# Patient Record
Sex: Male | Born: 1960 | Race: White | Hispanic: No | Smoking: Former smoker
Health system: Southern US, Community
[De-identification: ages and names within clinical notes are randomized; demographics above are authoritative.]

## PROBLEM LIST (undated history)

## (undated) DIAGNOSIS — M778 Other enthesopathies, not elsewhere classified: Secondary | ICD-10-CM

## (undated) DIAGNOSIS — R1310 Dysphagia, unspecified: Secondary | ICD-10-CM

## (undated) DIAGNOSIS — E669 Obesity, unspecified: Secondary | ICD-10-CM

## (undated) DIAGNOSIS — Z8619 Personal history of other infectious and parasitic diseases: Secondary | ICD-10-CM

## (undated) DIAGNOSIS — T7840XA Allergy, unspecified, initial encounter: Secondary | ICD-10-CM

## (undated) DIAGNOSIS — J80 Acute respiratory distress syndrome: Secondary | ICD-10-CM

## (undated) DIAGNOSIS — M25561 Pain in right knee: Secondary | ICD-10-CM

## (undated) DIAGNOSIS — B958 Unspecified staphylococcus as the cause of diseases classified elsewhere: Secondary | ICD-10-CM

## (undated) DIAGNOSIS — R202 Paresthesia of skin: Secondary | ICD-10-CM

## (undated) DIAGNOSIS — R200 Anesthesia of skin: Secondary | ICD-10-CM

## (undated) DIAGNOSIS — M25562 Pain in left knee: Secondary | ICD-10-CM

## (undated) DIAGNOSIS — U071 COVID-19: Secondary | ICD-10-CM

## (undated) DIAGNOSIS — K219 Gastro-esophageal reflux disease without esophagitis: Secondary | ICD-10-CM

## (undated) HISTORY — DX: Dysphagia, unspecified: R13.10

## (undated) HISTORY — DX: Acute respiratory distress syndrome: J80

## (undated) HISTORY — DX: Gastro-esophageal reflux disease without esophagitis: K21.9

## (undated) HISTORY — PX: KNEE ARTHROSCOPY: SUR90

## (undated) HISTORY — PX: HERNIA REPAIR: SHX51

## (undated) HISTORY — PX: TONSILLECTOMY AND ADENOIDECTOMY: SUR1326

## (undated) HISTORY — DX: Personal history of other infectious and parasitic diseases: Z86.19

## (undated) HISTORY — DX: COVID-19: U07.1

---

## 2020-10-20 ENCOUNTER — Other Ambulatory Visit: Payer: Self-pay

## 2020-10-20 ENCOUNTER — Emergency Department (HOSPITAL_COMMUNITY): Payer: BC Managed Care – PPO

## 2020-10-20 ENCOUNTER — Inpatient Hospital Stay (HOSPITAL_COMMUNITY)
Admission: EM | Admit: 2020-10-20 | Discharge: 2020-12-18 | DRG: 004 | Disposition: A | Discharge status: Rehabilitation Facility | Payer: BC Managed Care – PPO | Attending: Internal Medicine | Admitting: Internal Medicine

## 2020-10-20 ENCOUNTER — Encounter (HOSPITAL_COMMUNITY): Payer: Self-pay | Admitting: Emergency Medicine

## 2020-10-20 DIAGNOSIS — Z9911 Dependence on respirator [ventilator] status: Secondary | ICD-10-CM | POA: Diagnosis not present

## 2020-10-20 DIAGNOSIS — Z931 Gastrostomy status: Secondary | ICD-10-CM | POA: Diagnosis not present

## 2020-10-20 DIAGNOSIS — R739 Hyperglycemia, unspecified: Secondary | ICD-10-CM | POA: Diagnosis not present

## 2020-10-20 DIAGNOSIS — G9341 Metabolic encephalopathy: Secondary | ICD-10-CM | POA: Diagnosis not present

## 2020-10-20 DIAGNOSIS — E870 Hyperosmolality and hypernatremia: Secondary | ICD-10-CM | POA: Diagnosis not present

## 2020-10-20 DIAGNOSIS — J9601 Acute respiratory failure with hypoxia: Secondary | ICD-10-CM | POA: Diagnosis not present

## 2020-10-20 DIAGNOSIS — Z1624 Resistance to multiple antibiotics: Secondary | ICD-10-CM | POA: Diagnosis not present

## 2020-10-20 DIAGNOSIS — U071 COVID-19: Secondary | ICD-10-CM | POA: Diagnosis not present

## 2020-10-20 DIAGNOSIS — D6489 Other specified anemias: Secondary | ICD-10-CM | POA: Diagnosis not present

## 2020-10-20 DIAGNOSIS — E669 Obesity, unspecified: Secondary | ICD-10-CM | POA: Diagnosis present

## 2020-10-20 DIAGNOSIS — Z6835 Body mass index (BMI) 35.0-35.9, adult: Secondary | ICD-10-CM | POA: Diagnosis not present

## 2020-10-20 DIAGNOSIS — J9600 Acute respiratory failure, unspecified whether with hypoxia or hypercapnia: Secondary | ICD-10-CM

## 2020-10-20 DIAGNOSIS — D638 Anemia in other chronic diseases classified elsewhere: Secondary | ICD-10-CM | POA: Diagnosis present

## 2020-10-20 DIAGNOSIS — R6521 Severe sepsis with septic shock: Secondary | ICD-10-CM | POA: Diagnosis present

## 2020-10-20 DIAGNOSIS — U099 Post covid-19 condition, unspecified: Secondary | ICD-10-CM | POA: Diagnosis not present

## 2020-10-20 DIAGNOSIS — D62 Acute posthemorrhagic anemia: Secondary | ICD-10-CM

## 2020-10-20 DIAGNOSIS — R0902 Hypoxemia: Secondary | ICD-10-CM

## 2020-10-20 DIAGNOSIS — J158 Pneumonia due to other specified bacteria: Secondary | ICD-10-CM | POA: Diagnosis not present

## 2020-10-20 DIAGNOSIS — J181 Lobar pneumonia, unspecified organism: Secondary | ICD-10-CM | POA: Diagnosis not present

## 2020-10-20 DIAGNOSIS — Z23 Encounter for immunization: Secondary | ICD-10-CM

## 2020-10-20 DIAGNOSIS — R7989 Other specified abnormal findings of blood chemistry: Secondary | ICD-10-CM | POA: Diagnosis not present

## 2020-10-20 DIAGNOSIS — K567 Ileus, unspecified: Secondary | ICD-10-CM | POA: Diagnosis not present

## 2020-10-20 DIAGNOSIS — J80 Acute respiratory distress syndrome: Secondary | ICD-10-CM

## 2020-10-20 DIAGNOSIS — J1282 Pneumonia due to coronavirus disease 2019: Secondary | ICD-10-CM | POA: Diagnosis not present

## 2020-10-20 DIAGNOSIS — Z452 Encounter for adjustment and management of vascular access device: Secondary | ICD-10-CM

## 2020-10-20 DIAGNOSIS — A419 Sepsis, unspecified organism: Secondary | ICD-10-CM | POA: Diagnosis present

## 2020-10-20 DIAGNOSIS — R1310 Dysphagia, unspecified: Secondary | ICD-10-CM | POA: Diagnosis not present

## 2020-10-20 DIAGNOSIS — E878 Other disorders of electrolyte and fluid balance, not elsewhere classified: Secondary | ICD-10-CM | POA: Diagnosis not present

## 2020-10-20 DIAGNOSIS — Z79899 Other long term (current) drug therapy: Secondary | ICD-10-CM | POA: Diagnosis not present

## 2020-10-20 DIAGNOSIS — S90411A Abrasion, right great toe, initial encounter: Secondary | ICD-10-CM | POA: Diagnosis not present

## 2020-10-20 DIAGNOSIS — J9602 Acute respiratory failure with hypercapnia: Secondary | ICD-10-CM

## 2020-10-20 DIAGNOSIS — L89312 Pressure ulcer of right buttock, stage 2: Secondary | ICD-10-CM | POA: Diagnosis not present

## 2020-10-20 DIAGNOSIS — E440 Moderate protein-calorie malnutrition: Secondary | ICD-10-CM | POA: Diagnosis not present

## 2020-10-20 DIAGNOSIS — N170 Acute kidney failure with tubular necrosis: Secondary | ICD-10-CM | POA: Diagnosis not present

## 2020-10-20 DIAGNOSIS — E871 Hypo-osmolality and hyponatremia: Secondary | ICD-10-CM | POA: Diagnosis not present

## 2020-10-20 DIAGNOSIS — N179 Acute kidney failure, unspecified: Secondary | ICD-10-CM

## 2020-10-20 DIAGNOSIS — L89150 Pressure ulcer of sacral region, unstageable: Secondary | ICD-10-CM | POA: Diagnosis not present

## 2020-10-20 DIAGNOSIS — Z430 Encounter for attention to tracheostomy: Secondary | ICD-10-CM | POA: Diagnosis not present

## 2020-10-20 DIAGNOSIS — D649 Anemia, unspecified: Secondary | ICD-10-CM | POA: Diagnosis not present

## 2020-10-20 DIAGNOSIS — J156 Pneumonia due to other aerobic Gram-negative bacteria: Secondary | ICD-10-CM | POA: Diagnosis not present

## 2020-10-20 DIAGNOSIS — A498 Other bacterial infections of unspecified site: Secondary | ICD-10-CM | POA: Diagnosis not present

## 2020-10-20 DIAGNOSIS — R5381 Other malaise: Secondary | ICD-10-CM | POA: Diagnosis not present

## 2020-10-20 DIAGNOSIS — E781 Pure hyperglyceridemia: Secondary | ICD-10-CM | POA: Diagnosis present

## 2020-10-20 DIAGNOSIS — T17918A Gastric contents in respiratory tract, part unspecified causing other injury, initial encounter: Secondary | ICD-10-CM | POA: Diagnosis not present

## 2020-10-20 DIAGNOSIS — E46 Unspecified protein-calorie malnutrition: Secondary | ICD-10-CM | POA: Diagnosis present

## 2020-10-20 DIAGNOSIS — G7281 Critical illness myopathy: Secondary | ICD-10-CM

## 2020-10-20 DIAGNOSIS — M62838 Other muscle spasm: Secondary | ICD-10-CM | POA: Diagnosis not present

## 2020-10-20 DIAGNOSIS — Z515 Encounter for palliative care: Secondary | ICD-10-CM | POA: Diagnosis not present

## 2020-10-20 DIAGNOSIS — R579 Shock, unspecified: Secondary | ICD-10-CM | POA: Diagnosis not present

## 2020-10-20 DIAGNOSIS — G6281 Critical illness polyneuropathy: Secondary | ICD-10-CM | POA: Diagnosis not present

## 2020-10-20 DIAGNOSIS — Z7189 Other specified counseling: Secondary | ICD-10-CM | POA: Diagnosis not present

## 2020-10-20 DIAGNOSIS — Z4659 Encounter for fitting and adjustment of other gastrointestinal appliance and device: Secondary | ICD-10-CM | POA: Diagnosis not present

## 2020-10-20 DIAGNOSIS — J690 Pneumonitis due to inhalation of food and vomit: Secondary | ICD-10-CM | POA: Diagnosis not present

## 2020-10-20 DIAGNOSIS — Z87891 Personal history of nicotine dependence: Secondary | ICD-10-CM | POA: Diagnosis not present

## 2020-10-20 DIAGNOSIS — G4700 Insomnia, unspecified: Secondary | ICD-10-CM | POA: Diagnosis not present

## 2020-10-20 DIAGNOSIS — Z8619 Personal history of other infectious and parasitic diseases: Secondary | ICD-10-CM | POA: Diagnosis not present

## 2020-10-20 DIAGNOSIS — J9690 Respiratory failure, unspecified, unspecified whether with hypoxia or hypercapnia: Secondary | ICD-10-CM

## 2020-10-20 DIAGNOSIS — T380X5A Adverse effect of glucocorticoids and synthetic analogues, initial encounter: Secondary | ICD-10-CM | POA: Diagnosis not present

## 2020-10-20 DIAGNOSIS — M792 Neuralgia and neuritis, unspecified: Secondary | ICD-10-CM | POA: Diagnosis not present

## 2020-10-20 DIAGNOSIS — G5621 Lesion of ulnar nerve, right upper limb: Secondary | ICD-10-CM | POA: Diagnosis not present

## 2020-10-20 DIAGNOSIS — R0689 Other abnormalities of breathing: Secondary | ICD-10-CM | POA: Diagnosis not present

## 2020-10-20 DIAGNOSIS — B9729 Other coronavirus as the cause of diseases classified elsewhere: Secondary | ICD-10-CM | POA: Diagnosis not present

## 2020-10-20 DIAGNOSIS — E875 Hyperkalemia: Secondary | ICD-10-CM | POA: Diagnosis not present

## 2020-10-20 DIAGNOSIS — A0839 Other viral enteritis: Secondary | ICD-10-CM | POA: Diagnosis not present

## 2020-10-20 DIAGNOSIS — E639 Nutritional deficiency, unspecified: Secondary | ICD-10-CM | POA: Diagnosis not present

## 2020-10-20 DIAGNOSIS — T17908D Unspecified foreign body in respiratory tract, part unspecified causing other injury, subsequent encounter: Secondary | ICD-10-CM | POA: Diagnosis not present

## 2020-10-20 DIAGNOSIS — A4189 Other specified sepsis: Principal | ICD-10-CM | POA: Diagnosis present

## 2020-10-20 DIAGNOSIS — Z930 Tracheostomy status: Secondary | ICD-10-CM | POA: Diagnosis not present

## 2020-10-20 DIAGNOSIS — N200 Calculus of kidney: Secondary | ICD-10-CM | POA: Diagnosis not present

## 2020-10-20 DIAGNOSIS — N3941 Urge incontinence: Secondary | ICD-10-CM | POA: Diagnosis not present

## 2020-10-20 DIAGNOSIS — E441 Mild protein-calorie malnutrition: Secondary | ICD-10-CM | POA: Diagnosis not present

## 2020-10-20 DIAGNOSIS — Z6832 Body mass index (BMI) 32.0-32.9, adult: Secondary | ICD-10-CM

## 2020-10-20 DIAGNOSIS — L89322 Pressure ulcer of left buttock, stage 2: Secondary | ICD-10-CM | POA: Diagnosis not present

## 2020-10-20 DIAGNOSIS — Z4682 Encounter for fitting and adjustment of non-vascular catheter: Secondary | ICD-10-CM | POA: Diagnosis not present

## 2020-10-20 DIAGNOSIS — E876 Hypokalemia: Secondary | ICD-10-CM | POA: Diagnosis not present

## 2020-10-20 DIAGNOSIS — D696 Thrombocytopenia, unspecified: Secondary | ICD-10-CM | POA: Diagnosis not present

## 2020-10-20 DIAGNOSIS — Z0189 Encounter for other specified special examinations: Secondary | ICD-10-CM

## 2020-10-20 DIAGNOSIS — T17908A Unspecified foreign body in respiratory tract, part unspecified causing other injury, initial encounter: Secondary | ICD-10-CM

## 2020-10-20 DIAGNOSIS — R609 Edema, unspecified: Secondary | ICD-10-CM | POA: Diagnosis not present

## 2020-10-20 DIAGNOSIS — Z8616 Personal history of COVID-19: Secondary | ICD-10-CM | POA: Diagnosis not present

## 2020-10-20 HISTORY — DX: Other enthesopathies, not elsewhere classified: M77.8

## 2020-10-20 HISTORY — DX: Pain in right knee: M25.562

## 2020-10-20 HISTORY — DX: Anesthesia of skin: R20.0

## 2020-10-20 HISTORY — DX: Pain in right knee: M25.561

## 2020-10-20 HISTORY — DX: Allergy, unspecified, initial encounter: T78.40XA

## 2020-10-20 HISTORY — DX: Paresthesia of skin: R20.2

## 2020-10-20 HISTORY — DX: Obesity, unspecified: E66.9

## 2020-10-20 HISTORY — DX: Unspecified staphylococcus as the cause of diseases classified elsewhere: B95.8

## 2020-10-20 LAB — URINALYSIS, ROUTINE W REFLEX MICROSCOPIC
Bilirubin Urine: NEGATIVE
Glucose, UA: NEGATIVE mg/dL
Hgb urine dipstick: NEGATIVE
Ketones, ur: 5 mg/dL — AB
Leukocytes,Ua: NEGATIVE
Nitrite: NEGATIVE
Protein, ur: 30 mg/dL — AB
Specific Gravity, Urine: 1.016 (ref 1.005–1.030)
pH: 5 (ref 5.0–8.0)

## 2020-10-20 LAB — COMPREHENSIVE METABOLIC PANEL
ALT: 44 U/L (ref 0–44)
AST: 114 U/L — ABNORMAL HIGH (ref 15–41)
Albumin: 3.3 g/dL — ABNORMAL LOW (ref 3.5–5.0)
Alkaline Phosphatase: 57 U/L (ref 38–126)
Anion gap: 12 (ref 5–15)
BUN: 18 mg/dL (ref 6–20)
CO2: 23 mmol/L (ref 22–32)
Calcium: 8.5 mg/dL — ABNORMAL LOW (ref 8.9–10.3)
Chloride: 97 mmol/L — ABNORMAL LOW (ref 98–111)
Creatinine, Ser: 1.18 mg/dL (ref 0.61–1.24)
GFR, Estimated: >60 mL/min (ref >60)
Glucose, Bld: 104 mg/dL — ABNORMAL HIGH (ref 70–99)
Potassium: 4.2 mmol/L (ref 3.5–5.1)
Sodium: 132 mmol/L — ABNORMAL LOW (ref 135–145)
Total Bilirubin: 0.8 mg/dL (ref 0.3–1.2)
Total Protein: 6.9 g/dL (ref 6.5–8.1)

## 2020-10-20 LAB — RESP PANEL BY RT-PCR (FLU A&B, COVID) ARPGX2
Influenza A by PCR: NEGATIVE
Influenza B by PCR: NEGATIVE
SARS Coronavirus 2 by RT PCR: POSITIVE — AB

## 2020-10-20 LAB — FIBRINOGEN: Fibrinogen: 763 mg/dL — ABNORMAL HIGH (ref 210–475)

## 2020-10-20 LAB — FERRITIN: Ferritin: 1526 ng/mL — ABNORMAL HIGH (ref 24–336)

## 2020-10-20 LAB — BLOOD GAS, ARTERIAL
Acid-base deficit: 1.3 mmol/L (ref 0.0–2.0)
Bicarbonate: 21.7 mmol/L (ref 20.0–28.0)
Drawn by: 38235
FIO2: 100
MECHVT: 600 mL
O2 Saturation: 72.1 %
PEEP: 10 cmH2O
Patient temperature: 39
RATE: 24 resp/min
pCO2 arterial: 60.5 mmHg — ABNORMAL HIGH (ref 32.0–48.0)
pH, Arterial: 7.247 — ABNORMAL LOW (ref 7.350–7.450)
pO2, Arterial: 55.6 mmHg — ABNORMAL LOW (ref 83.0–108.0)

## 2020-10-20 LAB — TRIGLYCERIDES: Triglycerides: 65 mg/dL (ref <150)

## 2020-10-20 LAB — CBC WITH DIFFERENTIAL/PLATELET
Abs Immature Granulocytes: 0.22 10*3/uL — ABNORMAL HIGH (ref 0.00–0.07)
Basophils Absolute: 0.1 10*3/uL (ref 0.0–0.1)
Basophils Relative: 1 %
Eosinophils Absolute: 0.1 10*3/uL (ref 0.0–0.5)
Eosinophils Relative: 1 %
HCT: 41.7 % (ref 39.0–52.0)
Hemoglobin: 14.4 g/dL (ref 13.0–17.0)
Immature Granulocytes: 4 %
Lymphocytes Relative: 15 %
Lymphs Abs: 0.9 10*3/uL (ref 0.7–4.0)
MCH: 33.2 pg (ref 26.0–34.0)
MCHC: 34.5 g/dL (ref 30.0–36.0)
MCV: 96.1 fL (ref 80.0–100.0)
Monocytes Absolute: 0.4 10*3/uL (ref 0.1–1.0)
Monocytes Relative: 6 %
Neutro Abs: 4.6 10*3/uL (ref 1.7–7.7)
Neutrophils Relative %: 73 %
Platelets: 230 10*3/uL (ref 150–400)
RBC: 4.34 MIL/uL (ref 4.22–5.81)
RDW: 13.2 % (ref 11.5–15.5)
WBC: 6.5 10*3/uL (ref 4.0–10.5)
nRBC: 0 % (ref 0.0–0.2)

## 2020-10-20 LAB — D-DIMER, QUANTITATIVE: D-Dimer, Quant: 2.53 ug/mL-FEU — ABNORMAL HIGH (ref 0.00–0.50)

## 2020-10-20 LAB — PROCALCITONIN: Procalcitonin: 0.31 ng/mL

## 2020-10-20 LAB — APTT: aPTT: 34 seconds (ref 24–36)

## 2020-10-20 LAB — TROPONIN I (HIGH SENSITIVITY): Troponin I (High Sensitivity): 118 ng/L (ref <18)

## 2020-10-20 LAB — PROTIME-INR
INR: 1 (ref 0.8–1.2)
Prothrombin Time: 13.2 seconds (ref 11.4–15.2)

## 2020-10-20 LAB — C-REACTIVE PROTEIN: CRP: 24.9 mg/dL — ABNORMAL HIGH (ref <1.0)

## 2020-10-20 LAB — LACTIC ACID, PLASMA: Lactic Acid, Venous: 1.8 mmol/L (ref 0.5–1.9)

## 2020-10-20 LAB — LACTATE DEHYDROGENASE: LDH: 933 U/L — ABNORMAL HIGH (ref 98–192)

## 2020-10-20 LAB — BRAIN NATRIURETIC PEPTIDE: B Natriuretic Peptide: 47 pg/mL (ref 0.0–100.0)

## 2020-10-20 MED ORDER — ALBUTEROL (5 MG/ML) CONTINUOUS INHALATION SOLN
10.0000 mg | INHALATION_SOLUTION | RESPIRATORY_TRACT | Status: AC
  Administered 2020-10-20: 10 mg via RESPIRATORY_TRACT
  Filled 2020-10-20: qty 20

## 2020-10-20 MED ORDER — SODIUM CHLORIDE 0.9 % IV SOLN
100.0000 mg | Freq: Every day | INTRAVENOUS | Status: AC
  Administered 2020-10-21 – 2020-10-24 (×4): 100 mg via INTRAVENOUS
  Filled 2020-10-20 (×4): qty 20

## 2020-10-20 MED ORDER — SODIUM CHLORIDE 0.9 % IV SOLN
2.0000 g | INTRAVENOUS | Status: DC
  Administered 2020-10-20: 2 g via INTRAVENOUS
  Filled 2020-10-20: qty 20

## 2020-10-20 MED ORDER — PIPERACILLIN-TAZOBACTAM 3.375 G IVPB 30 MIN
3.3750 g | Freq: Once | INTRAVENOUS | Status: AC
  Administered 2020-10-21: 3.375 g via INTRAVENOUS
  Filled 2020-10-20: qty 50

## 2020-10-20 MED ORDER — SODIUM CHLORIDE 0.9 % IV BOLUS
1000.0000 mL | Freq: Once | INTRAVENOUS | Status: AC
  Administered 2020-10-21: 1000 mL via INTRAVENOUS

## 2020-10-20 MED ORDER — DEXAMETHASONE SODIUM PHOSPHATE 10 MG/ML IJ SOLN
10.0000 mg | Freq: Once | INTRAMUSCULAR | Status: AC
  Administered 2020-10-20: 10 mg via INTRAVENOUS
  Filled 2020-10-20: qty 1

## 2020-10-20 MED ORDER — VANCOMYCIN HCL IN DEXTROSE 1-5 GM/200ML-% IV SOLN
1000.0000 mg | Freq: Once | INTRAVENOUS | Status: AC
  Administered 2020-10-21: 1000 mg via INTRAVENOUS
  Filled 2020-10-20: qty 200

## 2020-10-20 MED ORDER — ACETAMINOPHEN 650 MG RE SUPP
650.0000 mg | Freq: Once | RECTAL | Status: AC
  Administered 2020-10-20: 650 mg via RECTAL

## 2020-10-20 MED ORDER — SODIUM CHLORIDE 0.9 % IV SOLN
500.0000 mg | INTRAVENOUS | Status: DC
  Administered 2020-10-20: 500 mg via INTRAVENOUS
  Filled 2020-10-20: qty 500

## 2020-10-20 MED ORDER — NOREPINEPHRINE 4 MG/250ML-% IV SOLN
0.0000 ug/min | INTRAVENOUS | Status: DC
  Administered 2020-10-20: 2 ug/min via INTRAVENOUS
  Administered 2020-10-21 – 2020-10-22 (×3): 7 ug/min via INTRAVENOUS
  Filled 2020-10-20 (×4): qty 250

## 2020-10-20 MED ORDER — SODIUM CHLORIDE 0.9 % IV SOLN
100.0000 mg | INTRAVENOUS | Status: AC
  Administered 2020-10-20 – 2020-10-21 (×2): 100 mg via INTRAVENOUS
  Filled 2020-10-20 (×2): qty 20

## 2020-10-20 MED ORDER — FENTANYL 2500MCG IN NS 250ML (10MCG/ML) PREMIX INFUSION
0.0000 ug/h | INTRAVENOUS | Status: DC
  Administered 2020-10-20: 25 ug/h via INTRAVENOUS
  Administered 2020-10-21: 75 ug/h via INTRAVENOUS
  Filled 2020-10-20: qty 250

## 2020-10-20 MED ORDER — LACTATED RINGERS IV SOLN: INTRAVENOUS | Status: DC

## 2020-10-20 MED ORDER — NICARDIPINE HCL IN NACL 20-0.86 MG/200ML-% IV SOLN
3.0000 mg/h | INTRAVENOUS | Status: DC
  Administered 2020-10-20: 5 mg/h via INTRAVENOUS
  Filled 2020-10-20: qty 200

## 2020-10-20 MED ORDER — PROPOFOL 1000 MG/100ML IV EMUL
5.0000 ug/kg/min | INTRAVENOUS | Status: DC
  Administered 2020-10-21 (×2): 50 ug/kg/min via INTRAVENOUS
  Administered 2020-10-21: 40 ug/kg/min via INTRAVENOUS
  Filled 2020-10-20: qty 100
  Filled 2020-10-20: qty 200
  Filled 2020-10-20: qty 100
  Filled 2020-10-20: qty 200

## 2020-10-20 NOTE — Progress Notes (Signed)
Following for Code Sepsis  

## 2020-10-20 NOTE — ED Triage Notes (Signed)
Pt arrives in severe resp distress from home. Pt 19% on RA. Arrives on NRB at 51%. EDP Miller to bedside. Pt dx with COVID 2 days ago.

## 2020-10-20 NOTE — ED Provider Notes (Signed)
[facility] EMERGENCY DEPARTMENT Provider Note   CSN: [id number] Arrival date & time: 10/20/20  2021     History Chief Complaint  Patient presents with  . Respiratory Distress    Jackson Figueroa is a 59 y.o. male.  HPI   This patient is a 59 year old male, he presents to the hospital today with severe respiratory distress in fact it is to the point where he cannot give any information.  The patient is reportedly positive for Covid, tested a few days ago but became acutely worse and unresponsive at home tonight prompting the call to EMS who found the patient have an oxygen level of 19% with good waveform.  They immediately placed him on a nonrebreather, stimulated him and put him on a stretcher to bring to the hospital.  He was confused and altered the entire way not able to answer questions, level 5 caveat applies.  It is unclear whether this patient has any other significant history  Additional hx from wife - laid on bed int he aftenroon - and then couldn't wake him up = they both tested positive for covid 19 this last week.    PMH: None Meds: None other than vitamins. SH:  No tob, occasional wine, no drugs. Surgery:  Abd hernia surgery.  They live in [place] (moved from [place]),  Past Medical History:  Diagnosis Date  . Obesity     Patient Active Problem List   Diagnosis Date Noted  . Acute respiratory failure due to COVID-19 [facility]) 10/20/2020       No family history on file.  Social History   Tobacco Use  . Smoking status: Not on file  Substance Use Topics  . Alcohol use: Not on file  . Drug use: Not on file    Home Medications Prior to Admission medications   Not on File    Allergies    Patient has no known allergies.  Review of Systems   Review of Systems  Unable to perform ROS: Acuity of condition    Physical Exam Updated Vital Signs BP 136/88 (BP Location: Left Arm)   Pulse (!) 118   Temp (!) 102.6 F (39.2 C) (Bladder)   Resp (!) 22   Ht  1.829 m (6')   Wt 120 kg   SpO2 (!) 81%   BMI 35.88 kg/m   Physical Exam Constitutional:      General: He is in acute distress.     Appearance: He is obese. He is ill-appearing and toxic-appearing.  HENT:     Head: Normocephalic and atraumatic.     Nose: Nose normal.     Mouth/Throat:     Mouth: Mucous membranes are moist.     Pharynx: No oropharyngeal exudate.  Eyes:     General: No scleral icterus.       Right eye: No discharge.        Left eye: No discharge.     Conjunctiva/sclera: Conjunctivae normal.     Pupils: Pupils are equal, round, and reactive to light.  Cardiovascular:     Rate and Rhythm: Tachycardia present.     Pulses: Normal pulses.  Pulmonary:     Effort: Respiratory distress present.     Breath sounds: Rales present.  Abdominal:     Tenderness: There is no abdominal tenderness.  Musculoskeletal:        General: No deformity or signs of injury.     Cervical back: Normal range of motion. No rigidity.  Right lower leg: No edema.     Left lower leg: No edema ( ).  Lymphadenopathy:     Cervical: No cervical adenopathy.  Skin:    General: Skin is warm and dry.     Findings: No erythema or rash.  Neurological:     Comments: Obtunded, able to respond to painful stimuli, occasionally wakes up and mumble something, sometimes his words make sense, most of the time they do not     ED Results / Procedures / Treatments   Labs (all labs ordered are listed, but only abnormal results are displayed) Labs Reviewed  CBC WITH DIFFERENTIAL/PLATELET - Abnormal; Notable for the following components:      Result Value   Abs Immature Granulocytes 0.22 (*)    All other components within normal limits  COMPREHENSIVE METABOLIC PANEL - Abnormal; Notable for the following components:   Sodium 132 (*)    Chloride 97 (*)    Glucose, Bld 104 (*)    Calcium 8.5 (*)    Albumin 3.3 (*)    AST 114 (*)    All other components within normal limits  D-DIMER, QUANTITATIVE  (NOT AT [facility]) - Abnormal; Notable for the following components:   D-Dimer, Quant 2.53 (*)    All other components within normal limits  LACTATE DEHYDROGENASE - Abnormal; Notable for the following components:   LDH 933 (*)    All other components within normal limits  FERRITIN - Abnormal; Notable for the following components:   Ferritin 1,526 (*)    All other components within normal limits  FIBRINOGEN - Abnormal; Notable for the following components:   Fibrinogen 763 (*)    All other components within normal limits  C-REACTIVE PROTEIN - Abnormal; Notable for the following components:   CRP 24.9 (*)    All other components within normal limits  URINALYSIS, ROUTINE W REFLEX MICROSCOPIC - Abnormal; Notable for the following components:   APPearance HAZY (*)    Ketones, ur 5 (*)    Protein, ur 30 (*)    Bacteria, UA RARE (*)    All other components within normal limits  TROPONIN I (HIGH SENSITIVITY) - Abnormal; Notable for the following components:   Troponin I (High Sensitivity) 118 (*)    All other components within normal limits  CULTURE, BLOOD (ROUTINE X 2)  RESP PANEL BY RT-PCR (FLU A&B, COVID) ARPGX2  CULTURE, BLOOD (ROUTINE X 2)  URINE CULTURE  LACTIC ACID, PLASMA  TRIGLYCERIDES  BRAIN NATRIURETIC PEPTIDE  PROTIME-INR  APTT  LACTIC ACID, PLASMA  PROCALCITONIN  BLOOD GAS, ARTERIAL    EKG EKG Interpretation  Date/Time:  Sunday October 20 2020 20:33:10 EST Ventricular Rate:  121 PR Interval:    QRS Duration: 100 QT Interval:  314 QTC Calculation: 446 R Axis:   -69 Text Interpretation: Sinus tachycardia LAD, consider left anterior fascicular block Abnormal R-wave progression, late transition Baseline wander in lead(s) V2 No old tracing to compare Confirmed by Noemi Chapel [id number]) on 10/20/2020 9:11:35 PM   Radiology DG Chest Portable 1 View  Result Date: 10/20/2020 CLINICAL DATA:  Check endotracheal tube placement EXAM: PORTABLE CHEST 1 VIEW COMPARISON:  None.  FINDINGS: Endotracheal tube is noted 4.3 cm above the carina. Gastric catheter extends into the stomach. Cardiac shadow is mildly prominent. Patchy airspace opacities are noted bilaterally consistent with the given clinical history of COVID-19 positivity. No bony abnormality is noted. No sizable effusion is seen. IMPRESSION: Endotracheal tube and gastric catheter are noted in satisfactory position.  Patchy airspace opacities consistent with the given clinical history. Electronically Signed   By: Inez Catalina M.D.   On: 10/20/2020 21:03    Procedures .Critical Care Performed by: Noemi Chapel, MD Authorized by: Noemi Chapel, MD   Critical care provider statement:    Critical care time (minutes):  35   Critical care time was exclusive of:  Separately billable procedures and treating other patients and teaching time   Critical care was necessary to treat or prevent imminent or life-threatening deterioration of the following conditions:  Respiratory failure and sepsis   Critical care was time spent personally by me on the following activities:  Blood draw for specimens, development of treatment plan with patient or surrogate, discussions with consultants, evaluation of patient's response to treatment, examination of patient, obtaining history from patient or surrogate, ordering and performing treatments and interventions, ordering and review of laboratory studies, ordering and review of radiographic studies, pulse oximetry, re-evaluation of patient's condition and review of old charts Comments:       Procedure Name: Intubation Date/Time: 10/20/2020 8:41 PM Performed by: Noemi Chapel, MD Pre-anesthesia Checklist: Patient identified, Patient being monitored, Emergency Drugs available, Timeout performed and Suction available Oxygen Delivery Method: Non-rebreather mask Preoxygenation: Pre-oxygenation with 100% oxygen Induction Type: Rapid sequence Ventilation: Mask ventilation without  difficulty Laryngoscope Size: Mac and 4 Grade View: Grade III Tube size: 8.0 mm Number of attempts: 1 Airway Equipment and Method: Stylet (direct) Placement Confirmation: ETT inserted through vocal cords under direct vision,  CO2 detector and Breath sounds checked- equal and bilateral Secured at: 24 cm Tube secured with: ETT holder Dental Injury: Teeth and Oropharynx as per pre-operative assessment  Difficulty Due To: Difficulty was unanticipated Comments:         (including critical care time)  Medications Ordered in ED Medications  lactated ringers infusion ( Intravenous New Bag/Given 10/20/20 2101)  cefTRIAXone (ROCEPHIN) 2 g in sodium chloride 0.9 % 100 mL IVPB (0 g Intravenous Stopped 10/20/20 2123)  azithromycin (ZITHROMAX) 500 mg in sodium chloride 0.9 % 250 mL IVPB (0 mg Intravenous Stopped 10/20/20 2211)  nicardipine (CARDENE) 33m in 0.86% saline 2075mIV infusion (0.1 mg/ml) (5 mg/hr Intravenous New Bag/Given 10/20/20 2143)  propofol (DIPRIVAN) 1000 MG/100ML infusion (20 mcg/kg/min  120 kg Intravenous Rate/Dose Change 10/20/20 2209)  dexamethasone (DECADRON) injection 10 mg (has no administration in time range)  albuterol (PROVENTIL,VENTOLIN) solution continuous neb (has no administration in time range)  acetaminophen (TYLENOL) suppository 650 mg (650 mg Rectal Given 10/20/20 2058)    ED Course  I have reviewed the triage vital signs and the nursing notes.  Pertinent labs & imaging results that were available during my care of the patient were reviewed by me and considered in my medical decision making (see chart for details).    MDM Rules/Calculators/A&P                          This patient is reportedly Covid positive, he is severely ill in fact critically ill with acute hypoxic respiratory failure likely secondary to COVID-19.  Sepsis order set will be used due to the severity of the illness, he will need antibiotics, he will need admission to an ICU, he was  intubated within 15 minutes of arrival secondary to his respiratory distress dress and persistent hypoxia despite nonrebreather only being at the low to mid 70% range and still completely altered.  This patient is ill-appearing, toxic appearing, severely hypoxic despite intubation.  He has oxygen of 81% after intubation with maximized oxygenation.  He is tachycardic, he is febrile to 102.6, given antipyretics, lactic acid of 1.8, started remdesivir and steroids.  Discussed with the intensive care unit physician who has accepted the patient to transfer to [facility], I appreciate Dr. Mariane Masters and his willingness to help care for this patient.  Vitals:   10/20/20 2100 10/20/20 2120 10/20/20 2150 10/20/20 2200  BP: (!) 214/125 (!) 172/104 132/88 136/88  Pulse: (!) 132 (!) 130 (!) 125 (!) 118  Resp: (!) 24 (!) 24 (!) 25 (!) 22  Temp: (!) 102.9 F (39.4 C)  (!) 102.9 F (39.4 C) (!) 102.6 F (39.2 C)  TempSrc: Bladder  Bladder Bladder  SpO2: (!) 83% (!) 84% (!) 82% (!) 81%  Weight:      Height:           Jackson Figueroa was evaluated in Emergency Department on 10/20/2020 for the symptoms described in the history of present illness. He was evaluated in the context of the global COVID-19 pandemic, which necessitated consideration that the patient might be at risk for infection with the SARS-CoV-2 virus that causes COVID-19. Institutional protocols and algorithms that pertain to the evaluation of patients at risk for COVID-19 are in a state of rapid change based on information released by regulatory bodies including the CDC and federal and state organizations. These policies and algorithms were followed during the patient's care in the ED.   Final Clinical Impression(s) / ED Diagnoses Final diagnoses:  Acute respiratory failure due to COVID-19 [facility])    Rx / DC Orders ED Discharge Orders    None       Noemi Chapel, MD 10/20/20 2221

## 2020-10-20 NOTE — ED Notes (Signed)
MAX dose Propofol 80MCG per KG/Min and DC Cardene per Dr. Sabra Heck Verbal order.

## 2020-10-20 NOTE — ED Notes (Signed)
CRITICAL VALUE ALERT  Critical Value: Covid + Date & Time Notied: 10/20/20 @ 2231 Provider Notified: Dr Sabra Heck  Orders Received/Actions taken: Expected Value

## 2020-10-20 NOTE — ED Notes (Signed)
Call from lab Critical Troponin 118  Dr Sabra Heck informed

## 2020-10-21 ENCOUNTER — Inpatient Hospital Stay (HOSPITAL_COMMUNITY): Payer: BC Managed Care – PPO

## 2020-10-21 ENCOUNTER — Encounter (HOSPITAL_COMMUNITY): Payer: Self-pay | Admitting: Specialist

## 2020-10-21 ENCOUNTER — Inpatient Hospital Stay: Payer: Self-pay

## 2020-10-21 DIAGNOSIS — U071 COVID-19: Secondary | ICD-10-CM

## 2020-10-21 DIAGNOSIS — R6521 Severe sepsis with septic shock: Secondary | ICD-10-CM

## 2020-10-21 DIAGNOSIS — J80 Acute respiratory distress syndrome: Secondary | ICD-10-CM

## 2020-10-21 DIAGNOSIS — R579 Shock, unspecified: Secondary | ICD-10-CM

## 2020-10-21 LAB — BLOOD GAS, ARTERIAL
Acid-base deficit: 4 mmol/L — ABNORMAL HIGH (ref 0.0–2.0)
Acid-base deficit: 3.7 mmol/L — ABNORMAL HIGH (ref 0.0–2.0)
Acid-base deficit: 4.6 mmol/L — ABNORMAL HIGH (ref 0.0–2.0)
Acid-base deficit: 3.4 mmol/L — ABNORMAL HIGH (ref 0.0–2.0)
Bicarbonate: 20.1 mmol/L (ref 20.0–28.0)
Bicarbonate: 22.8 mmol/L (ref 20.0–28.0)
Bicarbonate: 24.3 mmol/L (ref 20.0–28.0)
Bicarbonate: 25.1 mmol/L (ref 20.0–28.0)
Drawn by: 29503
Drawn by: 29503
Drawn by: 29503
FIO2: 100
FIO2: 100
FIO2: 100
FIO2: 100
MECHVT: 580 mL
MECHVT: 470 mL
MECHVT: 540 mL
O2 Saturation: 88 %
O2 Saturation: 91.6 %
O2 Saturation: 98.8 %
O2 Saturation: 99.6 %
PEEP: 12 cmH2O
PEEP: 16 cmH2O
PEEP: 16 cmH2O
Patient temperature: 36.6
Patient temperature: 98.6
Patient temperature: 98.6
Patient temperature: 98.6
RATE: 30 resp/min
RATE: 35 resp/min
RATE: 35 resp/min
pCO2 arterial: 48.8 mmHg — ABNORMAL HIGH (ref 32.0–48.0)
pCO2 arterial: 49 mmHg — ABNORMAL HIGH (ref 32.0–48.0)
pCO2 arterial: 65.1 mmHg (ref 32.0–48.0)
pCO2 arterial: 62.4 mmHg — ABNORMAL HIGH (ref 32.0–48.0)
pH, Arterial: 7.271 — ABNORMAL LOW (ref 7.350–7.450)
pH, Arterial: 7.29 — ABNORMAL LOW (ref 7.350–7.450)
pH, Arterial: 7.197 — CL (ref 7.350–7.450)
pH, Arterial: 7.229 — ABNORMAL LOW (ref 7.350–7.450)
pO2, Arterial: 63.4 mmHg — ABNORMAL LOW (ref 83.0–108.0)
pO2, Arterial: 65.2 mmHg — ABNORMAL LOW (ref 83.0–108.0)
pO2, Arterial: 249 mmHg — ABNORMAL HIGH (ref 83.0–108.0)
pO2, Arterial: 271 mmHg — ABNORMAL HIGH (ref 83.0–108.0)

## 2020-10-21 LAB — ECHOCARDIOGRAM LIMITED
Area-P 1/2: 3.21 cm2
Height: 72 in
S' Lateral: 4.1 cm
Weight: 4232.83 oz

## 2020-10-21 LAB — MRSA PCR SCREENING: MRSA by PCR: NEGATIVE

## 2020-10-21 LAB — LACTIC ACID, PLASMA: Lactic Acid, Venous: 1.3 mmol/L (ref 0.5–1.9)

## 2020-10-21 LAB — GLUCOSE, CAPILLARY: Glucose-Capillary: 203 mg/dL — ABNORMAL HIGH (ref 70–99)

## 2020-10-21 MED ORDER — METHYLPREDNISOLONE SODIUM SUCC 125 MG IJ SOLR
60.0000 mg | Freq: Two times a day (BID) | INTRAMUSCULAR | Status: DC
  Administered 2020-10-21 – 2020-10-24 (×8): 60 mg via INTRAVENOUS
  Filled 2020-10-21 (×9): qty 2

## 2020-10-21 MED ORDER — CHLORHEXIDINE GLUCONATE CLOTH 2 % EX PADS
6.0000 | MEDICATED_PAD | Freq: Every day | CUTANEOUS | Status: DC
  Administered 2020-10-21 – 2020-11-10 (×21): 6 via TOPICAL

## 2020-10-21 MED ORDER — FENTANYL BOLUS VIA INFUSION
50.0000 ug | INTRAVENOUS | Status: DC | PRN
  Administered 2020-10-22 – 2020-10-25 (×24): 50 ug via INTRAVENOUS
  Filled 2020-10-21: qty 50

## 2020-10-21 MED ORDER — FENTANYL 2500MCG IN NS 250ML (10MCG/ML) PREMIX INFUSION
50.0000 ug/h | INTRAVENOUS | Status: DC
  Administered 2020-10-21 – 2020-10-22 (×2): 150 ug/h via INTRAVENOUS
  Administered 2020-10-23 (×2): 275 ug/h via INTRAVENOUS
  Administered 2020-10-23: 200 ug/h via INTRAVENOUS
  Administered 2020-10-24: 275 ug/h via INTRAVENOUS
  Administered 2020-10-24 – 2020-10-25 (×3): 300 ug/h via INTRAVENOUS
  Filled 2020-10-21 (×9): qty 250

## 2020-10-21 MED ORDER — CISATRACURIUM BOLUS VIA INFUSION
10.0000 mg | Freq: Once | INTRAVENOUS | Status: AC
  Administered 2020-10-21: 10 mg via INTRAVENOUS
  Filled 2020-10-21: qty 10

## 2020-10-21 MED ORDER — ORAL CARE MOUTH RINSE
15.0000 mL | OROMUCOSAL | Status: DC
  Administered 2020-10-21 – 2020-11-05 (×151): 15 mL via OROMUCOSAL

## 2020-10-21 MED ORDER — MIDAZOLAM HCL 2 MG/2ML IJ SOLN
2.0000 mg | INTRAMUSCULAR | Status: AC | PRN
  Administered 2020-10-23 – 2020-10-24 (×3): 2 mg via INTRAVENOUS
  Filled 2020-10-21 (×2): qty 2

## 2020-10-21 MED ORDER — FENTANYL BOLUS VIA INFUSION
50.0000 ug | INTRAVENOUS | Status: DC | PRN
  Filled 2020-10-21: qty 50

## 2020-10-21 MED ORDER — FENTANYL 2500MCG IN NS 250ML (10MCG/ML) PREMIX INFUSION
50.0000 ug/h | INTRAVENOUS | Status: DC
  Administered 2020-10-21: 150 ug/h via INTRAVENOUS

## 2020-10-21 MED ORDER — SODIUM CHLORIDE 0.9 % IV SOLN
0.0000 ug/kg/min | INTRAVENOUS | Status: DC
  Administered 2020-10-21: 2 ug/kg/min via INTRAVENOUS
  Administered 2020-10-21: 3 ug/kg/min via INTRAVENOUS
  Administered 2020-10-22: 1 ug/kg/min via INTRAVENOUS
  Administered 2020-10-23: 1.5 ug/kg/min via INTRAVENOUS
  Filled 2020-10-21 (×5): qty 20

## 2020-10-21 MED ORDER — ENOXAPARIN SODIUM 120 MG/0.8ML ~~LOC~~ SOLN: 1.0000 mg/kg | SUBCUTANEOUS | Status: DC

## 2020-10-21 MED ORDER — ENOXAPARIN SODIUM 40 MG/0.4ML ~~LOC~~ SOLN: 40.0000 mg | SUBCUTANEOUS | Status: DC

## 2020-10-21 MED ORDER — CHLORHEXIDINE GLUCONATE 0.12% ORAL RINSE (MEDLINE KIT)
15.0000 mL | Freq: Two times a day (BID) | OROMUCOSAL | Status: DC
  Administered 2020-10-21 – 2020-11-05 (×30): 15 mL via OROMUCOSAL

## 2020-10-21 MED ORDER — DEXAMETHASONE SODIUM PHOSPHATE 10 MG/ML IJ SOLN: 10.0000 mg | INTRAMUSCULAR | Status: DC

## 2020-10-21 MED ORDER — MIDAZOLAM HCL 2 MG/2ML IJ SOLN
INTRAMUSCULAR | Status: AC
  Filled 2020-10-21: qty 2

## 2020-10-21 MED ORDER — DOCUSATE SODIUM 50 MG/5ML PO LIQD
100.0000 mg | Freq: Two times a day (BID) | ORAL | Status: DC
  Administered 2020-10-21 – 2020-10-23 (×4): 100 mg
  Filled 2020-10-21 (×4): qty 10

## 2020-10-21 MED ORDER — POLYETHYLENE GLYCOL 3350 17 G PO PACK
17.0000 g | PACK | Freq: Every day | ORAL | Status: DC
  Administered 2020-10-21 – 2020-10-23 (×3): 17 g
  Filled 2020-10-21 (×3): qty 1

## 2020-10-21 MED ORDER — FENTANYL CITRATE (PF) 100 MCG/2ML IJ SOLN: 50.0000 ug | Freq: Once | INTRAMUSCULAR | Status: DC

## 2020-10-21 MED ORDER — FAMOTIDINE IN NACL 20-0.9 MG/50ML-% IV SOLN
20.0000 mg | Freq: Two times a day (BID) | INTRAVENOUS | Status: DC
  Administered 2020-10-21: 20 mg via INTRAVENOUS
  Filled 2020-10-21: qty 50

## 2020-10-21 MED ORDER — KETAMINE HCL 10 MG/ML IJ SOLN: 100.0000 mg | Freq: Once | INTRAMUSCULAR | Status: AC

## 2020-10-21 MED ORDER — MIDAZOLAM HCL 2 MG/2ML IJ SOLN
2.0000 mg | INTRAMUSCULAR | Status: DC | PRN
  Administered 2020-10-21: 2 mg via INTRAVENOUS
  Filled 2020-10-21 (×2): qty 2

## 2020-10-21 MED ORDER — ENOXAPARIN SODIUM 60 MG/0.6ML ~~LOC~~ SOLN
60.0000 mg | SUBCUTANEOUS | Status: DC
  Administered 2020-10-21: 60 mg via SUBCUTANEOUS
  Filled 2020-10-21 (×2): qty 0.6

## 2020-10-21 MED ORDER — KETAMINE HCL 10 MG/ML IJ SOLN
INTRAMUSCULAR | Status: AC
  Administered 2020-10-21: 100 mg via INTRAVENOUS
  Filled 2020-10-21: qty 1

## 2020-10-21 MED ORDER — PROPOFOL 1000 MG/100ML IV EMUL
25.0000 ug/kg/min | INTRAVENOUS | Status: DC
  Administered 2020-10-21 – 2020-10-22 (×6): 50 ug/kg/min via INTRAVENOUS
  Administered 2020-10-22: 45 ug/kg/min via INTRAVENOUS
  Administered 2020-10-22: 50 ug/kg/min via INTRAVENOUS
  Administered 2020-10-22 (×2): 45 ug/kg/min via INTRAVENOUS
  Administered 2020-10-22: 50 ug/kg/min via INTRAVENOUS
  Administered 2020-10-22: 45 ug/kg/min via INTRAVENOUS
  Administered 2020-10-22 – 2020-10-23 (×3): 50 ug/kg/min via INTRAVENOUS
  Administered 2020-10-23: 55 ug/kg/min via INTRAVENOUS
  Administered 2020-10-23 (×2): 45 ug/kg/min via INTRAVENOUS
  Administered 2020-10-23 (×2): 50 ug/kg/min via INTRAVENOUS
  Administered 2020-10-23 (×2): 45 ug/kg/min via INTRAVENOUS
  Administered 2020-10-23: 50 ug/kg/min via INTRAVENOUS
  Administered 2020-10-24 (×3): 45 ug/kg/min via INTRAVENOUS
  Filled 2020-10-21 (×10): qty 100
  Filled 2020-10-21: qty 200
  Filled 2020-10-21 (×7): qty 100
  Filled 2020-10-21: qty 200
  Filled 2020-10-21 (×4): qty 100

## 2020-10-21 MED ORDER — ARTIFICIAL TEARS OPHTHALMIC OINT
1.0000 "application " | TOPICAL_OINTMENT | Freq: Three times a day (TID) | OPHTHALMIC | Status: DC
  Administered 2020-10-21 – 2020-10-24 (×9): 1 via OPHTHALMIC
  Filled 2020-10-21: qty 3.5

## 2020-10-21 MED ORDER — FENTANYL CITRATE (PF) 100 MCG/2ML IJ SOLN
50.0000 ug | Freq: Once | INTRAMUSCULAR | Status: DC
  Filled 2020-10-21: qty 2

## 2020-10-21 NOTE — ED Notes (Addendum)
RT to bedside. Pt continues on ventilator placed on his right side under chemical sedation with improved oxygenation. PT remains on cardiac monitor, ventilated with pulse ox in place. Stretcher in lowest position with rails up x 2 curtain open for direct observation.

## 2020-10-21 NOTE — ED Notes (Signed)
PT report called to [facility], verbalized complete understanding of Pt plan of care and current condition denies questions at this time.

## 2020-10-21 NOTE — TOC Initial Note (Signed)
Transition of Care [facility]) - Initial/Assessment Note    Patient Details  Name: Jackson Figueroa MRN: [id number] Date of Birth: 04-28-1961  Transition of [facility]) CM/SW Contact:    Leeroy Cha, RN Phone Number: 10/21/2020, 9:23 AM  Clinical Narrative:                 59 year old male with no significant past medical history and a positive COVID-19 test on 11/26 presented to [facility] with profound hypoxemia on 11/28.  Intubated immediately upon arrival found to be in ARDS.  Transferred to [facility] long hospital for ongoing ICU care in the early a.m. hours of 1/29.  History of present illness   Patient is encephalopathic and/or intubated. Therefore history has been obtained from chart review. 59 year old male with no significant past medical history.  He was reportedly diagnosed with COVID-19 on 11/26.  His clinical status became acutely worse on 11/28 as he was unable to get out of bed and was eventually found to be unresponsive.  EMS was called and upon their arrival initial vitals showed an oxygen saturation of 19%.  He was immediately supplied with supplemental oxygen and transported to [facility] emergency department where he was promptly intubated for hypoxemia and airway protection.  In the emergency department he was started on empiric antibiotics and developed shock requiring norepinephrine infusion.  He was transferred to [facility] for ICU admission.  Therapeutics for COVID-19 initiated include dexamethasone and remdesivir. Plan to return to home following for progression.  Expected Discharge Plan: Home/Self Care     Patient Goals and CMS Choice Patient states their goals for this hospitalization and ongoing recovery are:: on vent unanstable to state      Expected Discharge Plan and Services Expected Discharge Plan: Home/Self Care   Discharge Planning Services: CM Consult   Living arrangements for the past 2 months: Single Family Home                                       Prior Living Arrangements/Services Living arrangements for the past 2 months: Single Family Home Lives with:: Spouse                   Activities of Daily Living      Permission Sought/Granted                  Emotional Assessment              Admission diagnosis:  Acute respiratory failure due to COVID-19 ([place]) [U07.1, J96.00] Patient Active Problem List   Diagnosis Date Noted  . Acute respiratory failure due to COVID-19 ([place]) 10/20/2020   PCP:  Patient, No Pcp Per Pharmacy:   [facility], [address] [place] [address] Phone: [phone] Fax: [phone]     Social Determinants of Health (SDOH) Interventions    Readmission Risk Interventions No flowsheet data found.

## 2020-10-21 NOTE — Procedures (Signed)
Central Venous Catheter Insertion Procedure Note  Karsin Pesta  [id number]  1961/06/23  Date:10/21/20  Time:11:02 AM   Provider Performing:Chamika Cunanan Viona Gilmore He[place]   Procedure: Insertion of Non-tunneled Central Venous [phone]) with US guidance (70929)   Indication(s) Medication administration  Consent Risks of the procedure as well as the alternatives and risks of each were explained to the patient and/or caregiver.  Consent for the procedure was obtained and is signed in the bedside chart  Anesthesia Topical only with 1% lidocaine   Timeout Verified patient identification, verified procedure, site/side was marked, verified correct patient position, special equipment/implants available, medications/allergies/relevant history reviewed, required imaging and test results available.  Sterile Technique Maximal sterile technique including full sterile barrier drape, hand hygiene, sterile gown, sterile gloves, mask, hair covering, sterile ultrasound probe cover (if used).  Procedure Description Area of catheter insertion was cleaned with chlorhexidine and draped in sterile fashion.  With real-time ultrasound guidance a central venous catheter was placed into the right internal jugular vein. Nonpulsatile blood flow and easy flushing noted in all ports.  The catheter was sutured in place and sterile dressing applied.  Complications/Tolerance None; patient tolerated the procedure well. Chest X-ray is ordered to verify placement for internal jugular or subclavian cannulation.   Chest x-ray is not ordered for femoral cannulation.  EBL Minimal  Specimen(s) None  This is after failed attempt to place left IJ CVL. Easily obtained access, but was unable to advance guidewire more than 7-8 cm despite multiple attempts. CXR done, verified no PTX and proceeded to right IJ.   Georgann Housekeeper, AGACNP-BC [place]  See [place] for personal pager PCCM on call pager [phone]  10/21/2020 11:02 AM

## 2020-10-21 NOTE — H&P (Signed)
NAME:  Jackson Figueroa, MRN:  [id number], DOB:  1961-08-23, LOS: 1 ADMISSION DATE:  10/20/2020, CONSULTATION DATE: 11/29 REFERRING MD: Dr. Sabra Heck EDP, CHIEF COMPLAINT: ARDS, COVID-19  Brief History   59 year old male with no significant past medical history and a positive COVID-19 test on 11/26 presented to [facility] with profound hypoxemia on 11/28.  Intubated immediately upon arrival found to be in ARDS.  Transferred to [facility] long hospital for ongoing ICU care in the early a.m. hours of 1/29.  History of present illness   Patient is encephalopathic and/or intubated. Therefore history has been obtained from chart review. 59 year old male with no significant past medical history.  He was reportedly diagnosed with COVID-19 on 11/26.  His clinical status became acutely worse on 11/28 as he was unable to get out of bed and was eventually found to be unresponsive.  EMS was called and upon their arrival initial vitals showed an oxygen saturation of 19%.  He was immediately supplied with supplemental oxygen and transported to [facility] emergency department where he was promptly intubated for hypoxemia and airway protection.  In the emergency department he was started on empiric antibiotics and developed shock requiring norepinephrine infusion.  He was transferred to [facility] for ICU admission.  Therapeutics for COVID-19 initiated include dexamethasone and remdesivir.  Past Medical History   has a past medical history of Obesity.  Significant Hospital Events     Consults:    Procedures:  ETT 11/28 > CVL 11/29 >  Significant Diagnostic Tests:    Micro Data:  Blood 11/29 >  Antimicrobials:  CTX 11/28 > Azithromycin 11/28 >  Interim history/subjective:    Objective   Blood pressure 106/72, pulse 70, temperature (!) 97.3 F (36.3 C), resp. rate (!) 28, height 6' (1.829 m), weight 120 kg, SpO2 90 %.    Vent Mode: PRVC FiO2 (%):  [100 %] 100 % Set Rate:  [24 bmp-30  bmp] 30 bmp Vt Set:  [580 mL-600 mL] 580 mL PEEP:  [10 ERX54-00 cmH20] 12 cmH20 Plateau Pressure:  [25 cmH20-30 cmH20] 30 cmH20   Intake/Output Summary (Last 24 hours) at 10/21/2020 0727 Last data filed at 10/21/2020 0124 Gross per 24 hour  Intake 230 ml  Output --  Net 230 ml   Filed Weights   10/20/20 2045  Weight: 120 kg    Examination: General: overweight male on vent HENT: [place]/AT, PERRL, no JVD Lungs: Coarse bilaterally.  Cardiovascular: RRR, no MRG Abdomen: Soft, non-distended Extremities: No acute deformity. No Edema.  Neuro: Sedated. RASS -5.   Resolved Hospital Problem list     Assessment & Plan:   ARDS secondary to COVID-19. Positive outpatient test reportedly 11/26.  - Lung protective ventilation - Solumedrol 1mg /kg daily - Remdesivir continue - Will plan to prone once CVL placed as P/F is currently 60.  - Trend inflammatory markers - Propofol and fentanyl infusions for RASS goal -5  Septic shock: with added effect of sedative medications. Due to above.  - norepinephrine for MAP goal 65 mmHg - Place CVL - Check echocardiogram - avoid excessive hydration  Best practice (evaluated daily)   Diet: NPO, TF Pain/Anxiety/Delirium protocol (if indicated): Propofol and Fentanyl VAP protocol (if indicated): Per protocol DVT prophylaxis: Enoxaparin GI prophylaxis: Pepcid Glucose control: SSI  Mobility: Bedrest last date of multidisciplinary goals of care discussion Unable to reach significant other.  Family and staff present * Summary of discussion * Follow up goals of care discussion due: 12/5 Code Status:  FULL Disposition: ICU  Labs   CBC: Recent Labs  Lab 10/20/20 2042  WBC 6.5  NEUTROABS 4.6  HGB 14.4  HCT 41.7  MCV 96.1  PLT 784    Basic Metabolic Panel: Recent Labs  Lab 10/20/20 2042  NA 132*  K 4.2  CL 97*  CO2 23  GLUCOSE 104*  BUN 18  CREATININE 1.18  CALCIUM 8.5*   GFR: Estimated Creatinine Clearance: 92.4 mL/min (by C-G  formula based on SCr of 1.18 mg/dL). Recent Labs  Lab 10/20/20 2042 10/20/20 2314  PROCALCITON 0.31  --   WBC 6.5  --   LATICACIDVEN 1.8 1.3    Liver Function Tests: Recent Labs  Lab 10/20/20 2042  AST 114*  ALT 44  ALKPHOS 57  BILITOT 0.8  PROT 6.9  ALBUMIN 3.3*   No results for input(s): LIPASE, AMYLASE in the last 168 hours. No results for input(s): AMMONIA in the last 168 hours.  ABG    Component Value Date/Time   PHART 7.271 (L) 10/21/2020 0438   PCO2ART 48.8 (H) 10/21/2020 0438   PO2ART 63.4 (L) 10/21/2020 0438   HCO3 20.1 10/21/2020 0438   ACIDBASEDEF 4.0 (H) 10/21/2020 0438   O2SAT 88.0 10/21/2020 0438     Coagulation Profile: Recent Labs  Lab 10/20/20 2042  INR 1.0    Cardiac Enzymes: No results for input(s): CKTOTAL, CKMB, CKMBINDEX, TROPONINI in the last 168 hours.  HbA1C: No results found for: HGBA1C  CBG: No results for input(s): GLUCAP in the last 168 hours.  Review of Systems:   Patient is encephalopathic and/or intubated. Therefore history has been obtained from chart review.   Past Medical History  He,  has a past medical history of Obesity.   Surgical History     Social History      Family History   His family history is not on file.   Allergies No Known Allergies   Home Medications  Prior to Admission medications   Medication Sig Start Date End Date Taking? Authorizing Provider  Multiple Vitamin (MULTIVITAMIN WITH MINERALS) TABS tablet Take 1 tablet by mouth daily.   Yes [provider]     Critical care time: 50 minutes     Georgann Housekeeper, AGACNP-BC [place] for personal pager PCCM on call pager [phone]  10/21/2020 7:43 AM

## 2020-10-21 NOTE — Progress Notes (Signed)
Follow up ABG done before transfer. ABG shows improvement on current vent settings. [facility] loading patient for transfer.

## 2020-10-21 NOTE — Progress Notes (Signed)
  Echocardiogram 2D Echocardiogram has been performed.  Jackson Figueroa G Jackson Figueroa 10/21/2020, 1:47 PM

## 2020-10-21 NOTE — ED Notes (Signed)
Pt has RASS score of +2, continues on ventilator with signs of respiratory distress. Dr. Sabra Heck notified of same.

## 2020-10-21 NOTE — ED Notes (Signed)
PT report given to [facility] RN with [place] prior to arrival for transport, verbalized complete understanding and denies questions at this time.

## 2020-10-21 NOTE — Progress Notes (Signed)
[facility] Progress Note Patient Name: Jackson Figueroa DOB: 06-11-1961 MRN: [id number]   Date of Service  10/21/2020  HPI/Events of Note  62M admitted with acute hypoxemic respiratory failure due to COVID. He required intubation in the field by EMS. Saturating 60% on ventilator in the ED, requiring (partial) proning in order to improve his saturation to >90%.  On arrival to the ICU, the patient is in the supine position and saturating 89% on VC 580x30, 12, 100%. ABG shows 7.27/49/63/20/BE -4.0.   eICU Interventions  # Neuro: - Propofol/fentanyl drips.  # Cardiac: - Levophed to maintain MAP 65. - Stop LR IVF. - Will need central line placement this morning.  # Respiratory: - Continue MV: 580x30, 12, 100%. - Once central line is placed, will start proning the patient. - COVID: Remdesivir, Decadron 10mg  IV qday, consider Actemra.  # Renal: - Discontinue IVF as above.  # GI: - Will start tube feeding once stabilized.  # ID: - COVID: As above.  DVT PPX: Enoxaparin ppx. GI PPX: Famotidine IV      Intervention Category Evaluation Type: New Patient Evaluation  Jackson Figueroa 10/21/2020, 5:50 AM

## 2020-10-21 NOTE — ED Notes (Signed)
ER Dr. Betsey Holiday notified of Pts oxygen saturation and advised to reposition pt with RT to bedside to assist.

## 2020-10-21 NOTE — ED Notes (Signed)
PT continues on ventilator with continued signs of respiratory distress, RR 40 O2 sat 70. Pt remains chemically sedated with RASS score of +2

## 2020-10-21 NOTE — ED Notes (Signed)
100 Rocuronium and 30 Etomidate given to as RSI medications prior to first attempt successful intubation.

## 2020-10-21 NOTE — ED Notes (Signed)
PT placed on right side to improve O2 sat. Pt tolerated well with improved oxygen saturation following reposition.

## 2020-10-21 NOTE — Procedures (Signed)
Arterial Catheter Insertion Procedure Note  Jackson Figueroa  [id number]  1961-05-28  Date:10/21/20  Time:8:57 AM    Provider Performing: Corey Harold    Procedure: Insertion of Arterial Line [id number]) without US guidance  Indication(s) Blood pressure monitoring and/or need for frequent ABGs  Consent Unable to obtain consent due to emergent nature of procedure. Attempted to contact emergency contact without answer or callback.   Anesthesia None   Time Out Verified patient identification, verified procedure, site/side was marked, verified correct patient position, special equipment/implants available, medications/allergies/relevant history reviewed, required imaging and test results available.   Sterile Technique Maximal sterile technique including full sterile barrier drape, hand hygiene, sterile gown, sterile gloves, mask, hair covering, sterile ultrasound probe cover (if used).   Procedure Description Area of catheter insertion was cleaned with chlorhexidine and draped in sterile fashion. Without real-time ultrasound guidance an arterial catheter was placed into the left radial artery.  Appropriate arterial tracings confirmed on monitor.     Complications/Tolerance None; patient tolerated the procedure well.   EBL Minimal   Specimen(s) None   Georgann Housekeeper, AGACNP-BC [place] for personal pager PCCM on call pager [phone]  10/21/2020 8:58 AM

## 2020-10-22 ENCOUNTER — Inpatient Hospital Stay (HOSPITAL_COMMUNITY): Payer: BC Managed Care – PPO

## 2020-10-22 DIAGNOSIS — U071 COVID-19: Secondary | ICD-10-CM | POA: Diagnosis present

## 2020-10-22 DIAGNOSIS — A419 Sepsis, unspecified organism: Secondary | ICD-10-CM | POA: Diagnosis present

## 2020-10-22 DIAGNOSIS — R6521 Severe sepsis with septic shock: Secondary | ICD-10-CM

## 2020-10-22 DIAGNOSIS — J80 Acute respiratory distress syndrome: Secondary | ICD-10-CM

## 2020-10-22 DIAGNOSIS — J9600 Acute respiratory failure, unspecified whether with hypoxia or hypercapnia: Secondary | ICD-10-CM

## 2020-10-22 LAB — BLOOD GAS, ARTERIAL
Acid-Base Excess: 0.1 mmol/L (ref 0.0–2.0)
Acid-base deficit: 0.7 mmol/L (ref 0.0–2.0)
Bicarbonate: 26.8 mmol/L (ref 20.0–28.0)
Bicarbonate: 26 mmol/L (ref 20.0–28.0)
Drawn by: 29503
FIO2: 70
MECHVT: 540 mL
O2 Saturation: 91.2 %
O2 Saturation: 98.2 %
PEEP: 16 cmH2O
Patient temperature: 98.6
Patient temperature: 98.6
RATE: 35 resp/min
pCO2 arterial: 55.2 mmHg — ABNORMAL HIGH (ref 32.0–48.0)
pCO2 arterial: 54.8 mmHg — ABNORMAL HIGH (ref 32.0–48.0)
pH, Arterial: 7.308 — ABNORMAL LOW (ref 7.350–7.450)
pH, Arterial: 7.299 — ABNORMAL LOW (ref 7.350–7.450)
pO2, Arterial: 62.8 mmHg — ABNORMAL LOW (ref 83.0–108.0)
pO2, Arterial: 111 mmHg — ABNORMAL HIGH (ref 83.0–108.0)

## 2020-10-22 LAB — COMPREHENSIVE METABOLIC PANEL
ALT: 42 U/L (ref 0–44)
AST: 79 U/L — ABNORMAL HIGH (ref 15–41)
Albumin: 3 g/dL — ABNORMAL LOW (ref 3.5–5.0)
Alkaline Phosphatase: 63 U/L (ref 38–126)
Anion gap: 10 (ref 5–15)
BUN: 31 mg/dL — ABNORMAL HIGH (ref 6–20)
CO2: 26 mmol/L (ref 22–32)
Calcium: 8.1 mg/dL — ABNORMAL LOW (ref 8.9–10.3)
Chloride: 104 mmol/L (ref 98–111)
Creatinine, Ser: 1.45 mg/dL — ABNORMAL HIGH (ref 0.61–1.24)
GFR, Estimated: 56 mL/min — ABNORMAL LOW (ref >60)
Glucose, Bld: 170 mg/dL — ABNORMAL HIGH (ref 70–99)
Potassium: 4.9 mmol/L (ref 3.5–5.1)
Sodium: 140 mmol/L (ref 135–145)
Total Bilirubin: 0.5 mg/dL (ref 0.3–1.2)
Total Protein: 6.7 g/dL (ref 6.5–8.1)

## 2020-10-22 LAB — URINE CULTURE: Culture: NO GROWTH

## 2020-10-22 LAB — GLUCOSE, CAPILLARY
Glucose-Capillary: 162 mg/dL — ABNORMAL HIGH (ref 70–99)
Glucose-Capillary: 177 mg/dL — ABNORMAL HIGH (ref 70–99)
Glucose-Capillary: 184 mg/dL — ABNORMAL HIGH (ref 70–99)
Glucose-Capillary: 195 mg/dL — ABNORMAL HIGH (ref 70–99)

## 2020-10-22 LAB — CBC
HCT: 41.3 % (ref 39.0–52.0)
Hemoglobin: 14 g/dL (ref 13.0–17.0)
MCH: 33.1 pg (ref 26.0–34.0)
MCHC: 33.9 g/dL (ref 30.0–36.0)
MCV: 97.6 fL (ref 80.0–100.0)
Platelets: 275 10*3/uL (ref 150–400)
RBC: 4.23 MIL/uL (ref 4.22–5.81)
RDW: 14.3 % (ref 11.5–15.5)
WBC: 13.6 10*3/uL — ABNORMAL HIGH (ref 4.0–10.5)
nRBC: 0 % (ref 0.0–0.2)

## 2020-10-22 LAB — TRIGLYCERIDES: Triglycerides: 131 mg/dL (ref <150)

## 2020-10-22 LAB — MAGNESIUM: Magnesium: 2.6 mg/dL — ABNORMAL HIGH (ref 1.7–2.4)

## 2020-10-22 LAB — PHOSPHORUS: Phosphorus: 5 mg/dL — ABNORMAL HIGH (ref 2.5–4.6)

## 2020-10-22 LAB — D-DIMER, QUANTITATIVE: D-Dimer, Quant: 2.63 ug/mL-FEU — ABNORMAL HIGH (ref 0.00–0.50)

## 2020-10-22 MED ORDER — INSULIN ASPART 100 UNIT/ML ~~LOC~~ SOLN
0.0000 [IU] | SUBCUTANEOUS | Status: DC
  Administered 2020-10-22 – 2020-10-23 (×5): 3 [IU] via SUBCUTANEOUS
  Administered 2020-10-23: 5 [IU] via SUBCUTANEOUS
  Administered 2020-10-23 (×4): 3 [IU] via SUBCUTANEOUS
  Administered 2020-10-23: 5 [IU] via SUBCUTANEOUS
  Administered 2020-10-24 (×5): 3 [IU] via SUBCUTANEOUS
  Administered 2020-10-24 – 2020-10-25 (×2): 2 [IU] via SUBCUTANEOUS
  Administered 2020-10-25 – 2020-10-26 (×5): 3 [IU] via SUBCUTANEOUS
  Administered 2020-10-26: 5 [IU] via SUBCUTANEOUS
  Administered 2020-10-26: 2 [IU] via SUBCUTANEOUS
  Administered 2020-10-26: 3 [IU] via SUBCUTANEOUS
  Administered 2020-10-27 – 2020-10-28 (×5): 2 [IU] via SUBCUTANEOUS
  Administered 2020-10-28: 3 [IU] via SUBCUTANEOUS
  Administered 2020-10-28: 2 [IU] via SUBCUTANEOUS
  Administered 2020-10-28: 3 [IU] via SUBCUTANEOUS
  Administered 2020-10-29 – 2020-11-02 (×9): 2 [IU] via SUBCUTANEOUS
  Administered 2020-11-02: 17:00:00 3 [IU] via SUBCUTANEOUS
  Administered 2020-11-02: 12:00:00 2 [IU] via SUBCUTANEOUS
  Administered 2020-11-02: 08:00:00 5 [IU] via SUBCUTANEOUS
  Administered 2020-11-03: 22:00:00 3 [IU] via SUBCUTANEOUS
  Administered 2020-11-03 (×2): 2 [IU] via SUBCUTANEOUS
  Administered 2020-11-03 (×2): 3 [IU] via SUBCUTANEOUS
  Administered 2020-11-04: 01:00:00 2 [IU] via SUBCUTANEOUS
  Administered 2020-11-04: 16:00:00 5 [IU] via SUBCUTANEOUS
  Administered 2020-11-04 (×2): 3 [IU] via SUBCUTANEOUS
  Administered 2020-11-05 – 2020-11-07 (×7): 2 [IU] via SUBCUTANEOUS

## 2020-10-22 MED ORDER — PROSOURCE TF PO LIQD
90.0000 mL | Freq: Every day | ORAL | Status: DC
  Administered 2020-10-22 – 2020-10-23 (×6): 90 mL
  Filled 2020-10-22 (×7): qty 90

## 2020-10-22 MED ORDER — VITAL HIGH PROTEIN PO LIQD
1000.0000 mL | ORAL | Status: AC
  Administered 2020-10-22 – 2020-10-23 (×2): 1000 mL

## 2020-10-22 MED ORDER — RENA-VITE PO TABS
1.0000 | ORAL_TABLET | Freq: Every day | ORAL | Status: DC
  Administered 2020-10-22 – 2020-10-24 (×3): 1
  Filled 2020-10-22 (×3): qty 1

## 2020-10-22 MED ORDER — ENOXAPARIN SODIUM 60 MG/0.6ML ~~LOC~~ SOLN
60.0000 mg | Freq: Every day | SUBCUTANEOUS | Status: DC
  Administered 2020-10-22 – 2020-11-01 (×11): 60 mg via SUBCUTANEOUS
  Filled 2020-10-22 (×12): qty 0.6

## 2020-10-22 MED ORDER — SODIUM CHLORIDE 0.9 % IV SOLN
INTRAVENOUS | Status: DC | PRN
  Administered 2020-10-22 – 2020-10-25 (×3): 250 mL via INTRAVENOUS
  Administered 2020-11-14: 17:00:00 500 mL via INTRAVENOUS
  Administered 2020-11-16 – 2020-11-18 (×2): 250 mL via INTRAVENOUS

## 2020-10-22 MED ORDER — ADULT MULTIVITAMIN W/MINERALS CH: 1.0000 | ORAL_TABLET | Freq: Every day | ORAL | Status: DC

## 2020-10-22 MED ORDER — FAMOTIDINE IN NACL 20-0.9 MG/50ML-% IV SOLN
20.0000 mg | Freq: Two times a day (BID) | INTRAVENOUS | Status: DC
  Administered 2020-10-22 – 2020-10-31 (×19): 20 mg via INTRAVENOUS
  Filled 2020-10-22 (×19): qty 50

## 2020-10-22 MED FILL — Ketamine HCl Inj 10 MG/ML: INTRAMUSCULAR | Qty: 20 | Status: AC

## 2020-10-22 NOTE — Progress Notes (Addendum)
NAME:  Jackson Figueroa, MRN:  [id number], DOB:  28-Aug-1961, LOS: 2 ADMISSION DATE:  10/20/2020, CONSULTATION DATE: 11/29 REFERRING MD: Dr. Sabra Heck EDP, CHIEF COMPLAINT: ARDS, COVID-19  Brief History   59 year old male with no significant past medical history and a positive COVID-19 test on 11/26 presented to [facility] with profound hypoxemia on 11/28.  Intubated immediately upon arrival found to be in ARDS.  Transferred to [facility] long hospital for ongoing ICU care in the early a.m. hours of 1/29.  History of present illness   Patient is encephalopathic and/or intubated. Therefore history has been obtained from chart review. 59 year old male with no significant past medical history.  He was reportedly diagnosed with COVID-19 on 11/26.  His clinical status became acutely worse on 11/28 as he was unable to get out of bed and was eventually found to be unresponsive.  EMS was called and upon their arrival initial vitals showed an oxygen saturation of 19%.  He was immediately supplied with supplemental oxygen and transported to [facility] emergency department where he was promptly intubated for hypoxemia and airway protection.  In the emergency department he was started on empiric antibiotics and developed shock requiring norepinephrine infusion.  He was transferred to [facility] for ICU admission.  Therapeutics for COVID-19 initiated include dexamethasone and remdesivir.  Past Medical History   has a past medical history of Obesity.  Significant Hospital Events     Consults:    Procedures:  ETT 11/28 > CVL 11/29 >  Significant Diagnostic Tests:  Echo 11/29 > LVEF 50-55%, mild LVH, RV systolic function mildly reduced. Mild dilation of ascending aorta 11mm.   Micro Data:  Blood 11/29 >  Antimicrobials:  CTX 11/28 > 11/29 Azithromycin 11/28 > 11/29  Interim history/subjective:  Oxygen improved significantly with pronation.  16 hours prone. Just flipped supine and tolerating  well.   Objective   Blood pressure (!) 100/57, pulse 78, temperature (!) 97.2 F (36.2 C), resp. rate (!) 35, height 6' (1.829 m), weight 120 kg, SpO2 100 %.    Vent Mode: PRVC FiO2 (%):  [70 %-100 %] 70 % Set Rate:  [35 bmp] 35 bmp Vt Set:  [470 mL-540 mL] 540 mL PEEP:  [16 cmH20] 16 cmH20 Plateau Pressure:  [29 cmH20-32 cmH20] 32 cmH20   Intake/Output Summary (Last 24 hours) at 10/22/2020 0840 Last data filed at 10/22/2020 0600 Gross per 24 hour  Intake 1665.08 ml  Output 1500 ml  Net 165.08 ml   Filed Weights   10/20/20 2045  Weight: 120 kg    Examination:  General: overweight male on vent HENT: [place]/AT, PERRL, no JVD Lungs: Clear bilateral breath sounds. Vent supported breaths.  Cardiovascular: RRR, no MRG Abdomen: Soft, hypoactive Extremities: no edema. No deformity.  Neuro: paralyzed. BIS 39  Resolved Hospital Problem list     Assessment & Plan:   ARDS secondary to COVID-19. Positive outpatient test reportedly 11/26.  - Lung protective ventilation (7cc/kg due to resp acidosis, pPlat 30, driving pressure 14) - Fio2 now down to 70% - Solumedrol 1mg /kg daily - Remdesivir continue day 3/5 - Will check ABG one hour after supine, but will likely prone regardless at for at least one additional cycle. considering the overwhelming response his oxygenation had.  - Trend inflammatory markers - Propofol and fentanyl infusions for RASS goal -5 - Nimbex NMB. BIS, TO4 per protocol.   Septic shock: with added effect of sedative medications. Due to above.  - norepinephrine for MAP goal  65 mmHg - Check echocardiogram - avoid excessive hydration  Hyperglycemia: steroid induced.  - Add CBG monitoring and SSI.  Nutrition: - Trickle feeds via OGT  Dilation of ascending aorta on Echo 11/29 - warrants further workup if he survives this critical illness.   Best practice (evaluated daily)   Diet: NPO, TF Pain/Anxiety/Delirium protocol (if indicated): Propofol and  Fentanyl VAP protocol (if indicated): Per protocol DVT prophylaxis: Enoxaparin GI prophylaxis: Pepcid Glucose control: SSI  Mobility: Bedrest last date of multidisciplinary goals of care discussion: 11/29  Family and staff present Myself and sister via phone.  Summary of discussion Full code, if clinical situation worsens they know we will readdress.  Follow up goals of care discussion due: 12/5 Code Status: FULL Disposition: ICU  Labs   CBC: Recent Labs  Lab 10/20/20 2042 10/22/20 0338  WBC 6.5 13.6*  NEUTROABS 4.6  --   HGB 14.4 14.0  HCT 41.7 41.3  MCV 96.1 97.6  PLT 230 952    Basic Metabolic Panel: Recent Labs  Lab 10/20/20 2042 10/22/20 0338  NA 132* 140  K 4.2 4.9  CL 97* 104  CO2 23 26  GLUCOSE 104* 170*  BUN 18 31*  CREATININE 1.18 1.45*  CALCIUM 8.5* 8.1*  MG  --  2.6*  PHOS  --  5.0*   GFR: Estimated Creatinine Clearance: 75.2 mL/min (A) (by C-G formula based on SCr of 1.45 mg/dL (H)). Recent Labs  Lab 10/20/20 2042 10/20/20 2314 10/22/20 0338  PROCALCITON 0.31  --   --   WBC 6.5  --  13.6*  LATICACIDVEN 1.8 1.3  --     Liver Function Tests: Recent Labs  Lab 10/20/20 2042 10/22/20 0338  AST 114* 79*  ALT 44 42  ALKPHOS 57 63  BILITOT 0.8 0.5  PROT 6.9 6.7  ALBUMIN 3.3* 3.0*   No results for input(s): LIPASE, AMYLASE in the last 168 hours. No results for input(s): AMMONIA in the last 168 hours.  ABG    Component Value Date/Time   PHART 7.229 (L) 10/21/2020 1707   PCO2ART 62.4 (H) 10/21/2020 1707   PO2ART 271 (H) 10/21/2020 1707   HCO3 25.1 10/21/2020 1707   ACIDBASEDEF 3.4 (H) 10/21/2020 1707   O2SAT 99.6 10/21/2020 1707     Coagulation Profile: Recent Labs  Lab 10/20/20 2042  INR 1.0    Cardiac Enzymes: No results for input(s): CKTOTAL, CKMB, CKMBINDEX, TROPONINI in the last 168 hours.  HbA1C: No results found for: HGBA1C  CBG: Recent Labs  Lab 10/21/20 2052  GLUCAP 203*    Critical care time: 39 minutes     Unable to reach sister 11/30, no voicemail available. She is primary Media planner .  Georgann Housekeeper, AGACNP-BC [place]  See [facility] for personal pager PCCM on call pager [phone]  10/22/2020 8:40 AM

## 2020-10-22 NOTE — Progress Notes (Signed)
Initial Nutrition Assessment  DOCUMENTATION CODES:   Obesity unspecified  INTERVENTION:  - continue Vital High Protein @ 20 ml/hr and will order 90 ml Prosource TF 6 times/day. - this regimen + kcal from current propofol rate will provide 1910 kcal, 174 grams protein (97% estimated protein need), and 401 ml free water. - free water flush, if desired, to be per CCM. - will order 1 tablet multivitamin with minerals/day per OGT d/t TF rate <42 ml/hr. - will monitor for ability to advance TF above trickle rate.    NUTRITION DIAGNOSIS:   Increased nutrient needs related to acute illness, catabolic illness (UTMLY-65 infection) as evidenced by estimated needs  GOAL:   Provide needs based on ASPEN/SCCM guidelines  MONITOR:   Vent status, TF tolerance, Labs, Weight trends  REASON FOR ASSESSMENT:   Ventilator, Consult Enteral/tube feeding initiation and management  ASSESSMENT:   59 year old male with no significant medical history. He was found to be COVID-19 positive on 11/26. He presented to [facility] with profound hypoxemia on 11/28 and was emergently intubated in the ED; found to be in ARDS and transferred to [facility].  Patient remains intubated with OGT in place (no abd xray; CXR from 11/28 states "gastric catheter noted in satisfactory position" and no other indication of where tip of tube may be).   Patient is currently receiving trickle rate TF of Vital High Protein @ 20 ml/hr which is providing 480 kcal, 42 grams protein, and 401 ml free water.   Confirmed with CCM NP that plan for today is to continue trickle rate TF, do not advance. Plan is to re-prone patient, likely later today.   Weight on 11/28 was 264 lb and no other weight recordings available in the chart.   Per notes: - ARDS 2/2 COVID-19 - septic shock    Patient is currently intubated on ventilator support MV: 18.8 L/min Temp (24hrs), Avg:97.1 F (36.2 C), Min:95.4 F (35.2 C), Max:98.2 F (36.8  C) Propofol: 36 ml/hr (950 kcal)  Labs reviewed; CBGs: 162 and 177 mg/dl, creatinine: 1.45 mg/dl, BUN: 31 mg/dl, Ca: 8.1 mg/dl, Phos: 5 mg/dl, Mg: 2.6 mg/dl, AST elevated, GFR: 56 ml/min.  Medications reviewed; 100 mg colace BID, 20 mg IV pepcid BID, sliding scale novolog, 60 mg solu-medrol BID, 17 g miralax/day, 100 mg IV remdesivir x2 doses 11/28 and x1 dose/day x4 days (11/29-12/2).    NUTRITION - FOCUSED PHYSICAL EXAM:  completed; no muscle or fat depletions, mild edema to BLE.   Diet Order:   Diet Order            Diet NPO time specified  Diet effective now                 EDUCATION NEEDS:   No education needs have been identified at this time  Skin:  Skin Assessment: Reviewed RN Assessment  Last BM:  PTA/unknown  Height:   Ht Readings from Last 1 Encounters:  10/21/20 6' (1.829 m)    Weight:   Wt Readings from Last 1 Encounters:  10/20/20 120 kg     Estimated Nutritional Needs:  Kcal:  1900 kcal (20 kcal/kg adjBW) Protein:  180-195 grams Fluid:  >/= 2.2 L/day      Jarome Matin, MS, RD, LDN, CNSC Inpatient Clinical Dietitian RD pager # available in [place]  After hours/weekend pager # available in [facility]

## 2020-10-22 NOTE — Progress Notes (Signed)
Pt proned and ETT advanced 1cm from 24 to 25  Per CCM order.

## 2020-10-23 DIAGNOSIS — R739 Hyperglycemia, unspecified: Secondary | ICD-10-CM

## 2020-10-23 DIAGNOSIS — N179 Acute kidney failure, unspecified: Secondary | ICD-10-CM

## 2020-10-23 LAB — GLUCOSE, CAPILLARY
Glucose-Capillary: 173 mg/dL — ABNORMAL HIGH (ref 70–99)
Glucose-Capillary: 186 mg/dL — ABNORMAL HIGH (ref 70–99)
Glucose-Capillary: 187 mg/dL — ABNORMAL HIGH (ref 70–99)
Glucose-Capillary: 189 mg/dL — ABNORMAL HIGH (ref 70–99)
Glucose-Capillary: 189 mg/dL — ABNORMAL HIGH (ref 70–99)
Glucose-Capillary: 201 mg/dL — ABNORMAL HIGH (ref 70–99)
Glucose-Capillary: 210 mg/dL — ABNORMAL HIGH (ref 70–99)

## 2020-10-23 LAB — BLOOD GAS, ARTERIAL
Acid-base deficit: 2 mmol/L (ref 0.0–2.0)
Bicarbonate: 24.4 mmol/L (ref 20.0–28.0)
Drawn by: 29503
FIO2: 50
MECHVT: 540 mL
O2 Saturation: 96.8 %
PEEP: 16 cmH2O
Patient temperature: 98.6
RATE: 35 resp/min
pCO2 arterial: 51.5 mmHg — ABNORMAL HIGH (ref 32.0–48.0)
pH, Arterial: 7.298 — ABNORMAL LOW (ref 7.350–7.450)
pO2, Arterial: 91.9 mmHg (ref 83.0–108.0)

## 2020-10-23 LAB — CBC
HCT: 37.2 % — ABNORMAL LOW (ref 39.0–52.0)
Hemoglobin: 12.4 g/dL — ABNORMAL LOW (ref 13.0–17.0)
MCH: 33.4 pg (ref 26.0–34.0)
MCHC: 33.3 g/dL (ref 30.0–36.0)
MCV: 100.3 fL — ABNORMAL HIGH (ref 80.0–100.0)
Platelets: 276 10*3/uL (ref 150–400)
RBC: 3.71 MIL/uL — ABNORMAL LOW (ref 4.22–5.81)
RDW: 14.5 % (ref 11.5–15.5)
WBC: 11.1 10*3/uL — ABNORMAL HIGH (ref 4.0–10.5)
nRBC: 0 % (ref 0.0–0.2)

## 2020-10-23 LAB — TRIGLYCERIDES: Triglycerides: 243 mg/dL — ABNORMAL HIGH (ref <150)

## 2020-10-23 LAB — COMPREHENSIVE METABOLIC PANEL
ALT: 33 U/L (ref 0–44)
AST: 45 U/L — ABNORMAL HIGH (ref 15–41)
Albumin: 2.8 g/dL — ABNORMAL LOW (ref 3.5–5.0)
Alkaline Phosphatase: 59 U/L (ref 38–126)
Anion gap: 7 (ref 5–15)
BUN: 47 mg/dL — ABNORMAL HIGH (ref 6–20)
CO2: 30 mmol/L (ref 22–32)
Calcium: 8 mg/dL — ABNORMAL LOW (ref 8.9–10.3)
Chloride: 106 mmol/L (ref 98–111)
Creatinine, Ser: 1.95 mg/dL — ABNORMAL HIGH (ref 0.61–1.24)
GFR, Estimated: 39 mL/min — ABNORMAL LOW (ref >60)
Glucose, Bld: 190 mg/dL — ABNORMAL HIGH (ref 70–99)
Potassium: 4.7 mmol/L (ref 3.5–5.1)
Sodium: 143 mmol/L (ref 135–145)
Total Bilirubin: 0.4 mg/dL (ref 0.3–1.2)
Total Protein: 6.1 g/dL — ABNORMAL LOW (ref 6.5–8.1)

## 2020-10-23 LAB — HEMOGLOBIN A1C
Hgb A1c MFr Bld: 5.3 % (ref 4.8–5.6)
Mean Plasma Glucose: 105 mg/dL

## 2020-10-23 MED ORDER — PIVOT 1.5 CAL PO LIQD
1000.0000 mL | ORAL | Status: DC
  Administered 2020-10-23: 1000 mL
  Filled 2020-10-23 (×4): qty 1000

## 2020-10-23 MED ORDER — POLYETHYLENE GLYCOL 3350 17 G PO PACK: 17.0000 g | PACK | Freq: Two times a day (BID) | ORAL | Status: DC

## 2020-10-23 MED ORDER — INSULIN GLARGINE 100 UNIT/ML ~~LOC~~ SOLN
5.0000 [IU] | Freq: Two times a day (BID) | SUBCUTANEOUS | Status: DC
  Administered 2020-10-23 (×2): 5 [IU] via SUBCUTANEOUS
  Filled 2020-10-23 (×3): qty 0.05

## 2020-10-23 MED ORDER — FREE WATER
200.0000 mL | Status: DC
  Administered 2020-10-23 – 2020-10-25 (×12): 200 mL

## 2020-10-23 MED ORDER — PROSOURCE TF PO LIQD
90.0000 mL | Freq: Four times a day (QID) | ORAL | Status: DC
  Administered 2020-10-23 – 2020-11-14 (×84): 90 mL
  Filled 2020-10-23 (×81): qty 90

## 2020-10-23 NOTE — Progress Notes (Signed)
Attempted to call sister. No voice message left as mail box not set up.  Erick Colace ACNP-BC [place] Pager # [phone] OR # [phone] if no answer

## 2020-10-23 NOTE — TOC Progression Note (Signed)
Transition of Care [facility]) - Progression Note    Patient Details  Name: Jackson Figueroa MRN: [id number] Date of Birth: Jun 12, 1961  [facility] CM/SW Contact  Leeroy Cha, RN Phone Number: 10/23/2020, 7:27 AM  Clinical Narrative:    Ventilated at 50% fi02 Plan is to continue LTVV (had to liberalize to 8cc/kg due to hypercapnia.) maintain net negative fluid balance as tolerated. Maintain NMB for trial of 48 hours total. He is supined now but has had dramatic improvement in oxygenation status. Would continue prone positioning another day. Continue steroids and remdesevir. Will trickle tube feeds   Expected Discharge Plan: Home/Self Care    Expected Discharge Plan and Services Expected Discharge Plan: Home/Self Care   Discharge Planning Services: CM Consult   Living arrangements for the past 2 months: Single Family Home                                       Social Determinants of Health (SDOH) Interventions    Readmission Risk Interventions No flowsheet data found.

## 2020-10-23 NOTE — Progress Notes (Signed)
[facility] Progress Note Patient Name: Jackson Figueroa DOB: [birth year] MRN: [id number]   Date of Service  10/23/2020  HPI/Events of Note  Nursing request for [facility]. Patient has had 5 large liquid stools.   eICU Interventions  Plan: 1. Place Flexiseal.      Intervention Category Major Interventions: Other:  [name] 10/23/2020, 11:02 PM

## 2020-10-23 NOTE — Progress Notes (Signed)
NAME:  Jackson Figueroa, MRN:  [id number], DOB:  [birth year], LOS: 3 ADMISSION DATE:  10/20/2020, CONSULTATION DATE: 11/29 REFERRING MD: Dr. Sabra Heck EDP, CHIEF COMPLAINT: ARDS, COVID-19  Brief History   59 year old male with no significant past medical history and a positive COVID-19 test on 11/26 presented to [facility] with profound hypoxemia on 11/28.  Intubated immediately upon arrival found to be in ARDS.  Transferred to [facility] hospital for ongoing ICU care in the early a.m. hours of 1/29.   Past Medical History   has a past medical history of Obesity.  Significant Hospital Events   11/28 [facility] emergency department where he was promptly intubated for hypoxemia and airway protection.  In the emergency department he was started on empiric antibiotics and developed shock requiring norepinephrine infusion.  He was transferred to [facility] for ICU admission.  Therapeutics for COVID-19 initiated include dexamethasone and remdesivir. 11/29 shock state persisted. Central line placed, ARDS protocol continued, proning initiated 11/30: Pressor requirements continued, neuromuscular blockade initiated to facilitate ventilation tube feeds started, initiated proning 12/1: Stopping neuromuscular blockade   Consults:    Procedures:  ETT 11/28 > CVL 11/29 >  Significant Diagnostic Tests:  Echo 11/29 > LVEF 50-55%, mild LVH, RV systolic function mildly reduced. Mild dilation of ascending aorta 42mm.   Micro Data:  Blood 11/29 >  Antimicrobials:  CTX 11/28 > 11/29 Azithromycin 11/28 > 11/29  Interim history/subjective:  Just flipped supine, FiO2 and PEEP requirements improved Objective   Blood pressure 126/62, pulse 87, temperature 98.4 F (36.9 C), resp. rate (Abnormal) 35, height 6' (1.829 m), weight 129.8 kg, SpO2 96 %.    Vent Mode: PRVC FiO2 (%):  [50 %-70 %] 50 % Set Rate:  [35 bmp] 35 bmp Vt Set:  [540 mL] 540 mL PEEP:  [16 cmH20] 16 cmH20 Plateau  Pressure:  [28 cmH20-32 cmH20] 30 cmH20   Intake/Output Summary (Last 24 hours) at 10/23/2020 0836 Last data filed at 10/23/2020 0545 Gross per 24 hour  Intake 1406.56 ml  Output 1200 ml  Net 206.56 ml   Filed Weights   10/20/20 2045 10/23/20 0500  Weight: 120 kg 129.8 kg    Examination:  General sedated and paralyzed on NMB HENT NCAT orally intubated. OGT in place. Mild periorbital edema s/p being in prone position until just before this exam Pulm dec t/o but equal. Current vent settings: VT 540/peep 16 fio2 50% PPlat 29 driving pressure 13  sats 93%, current P/F ratio: Pending Card RRR HR 86, no MRG abd soft + bowel sounds tol tube feeds GU foley in place good UOP. Clear yellow Ext warm, brisk CR trace LE edema Neuro heavily sedated. Paralyzed on NMB. Current BIS 42  Resolved Hospital Problem list    Septic shock resolved Assessment & Plan:   Acute hypoxic respiratory failure with ARDS secondary to COVID-19. Positive outpatient test reportedly 11/26.  -FiO2 and PEEP requirements improving Plan Continue low tidal volume ventilation, meeting current goals at 7 cc/kg predicted body weight so we can continue this Plateau pressure goal less than 30, driving pressure goal less than 15 Continue to wean PEEP and FiO2 for pulse oximetry greater than 88%, and PaO2 greater than 65 Repeat arterial blood gas, if P/F ratio remains less than 150 will continue proning protocol Continue Solu-Medrol at 1 mg/kg daily, today is day number 4 Day #4 of 5 remdesivir Discontinuing neuromuscular blockade Continue heavy sedation with RASS goal -5 A.m. chest x-ray  Hyperglycemia: steroid induced.  -Still slightly hyperglycemic Plan Continue sliding scale insulin Add Lantus 5 units every 12 Glucose goal 1 40-1 80  Nutrition: Plan Continue supplemental tube feeds Start advancing per protocol now that stopping neuromuscular blockade May need to start bowel regimen if no BM by  12/2  Dilation of ascending aorta on Echo 11/29 - warrants further workup if he survives this critical illness.   Best practice (evaluated daily)   Diet: NPO, TF Pain/Anxiety/Delirium protocol (if indicated): Propofol and Fentanyl VAP protocol (if indicated): Per protocol DVT prophylaxis: Enoxaparin GI prophylaxis: Pepcid Glucose control: SSI  Mobility: Bedrest last date of multidisciplinary goals of care discussion: 11/29  Family and staff present: Pending Summary of discussion Full code, if clinical situation worsens they know we will readdress.  Follow up goals of care discussion due: 12/5 Code Status: FULL Disposition: ICU   Critical care time: 35 minutes     Erick Colace ACNP-BC [place] Pager # [phone] OR # [phone] if no answer

## 2020-10-23 NOTE — Progress Notes (Signed)
Spoke to patient's sister and updated her. Encouraged to call back if she had any other questions.

## 2020-10-23 NOTE — Progress Notes (Signed)
Nutrition Follow-up  RD working remotely.  DOCUMENTATION CODES:   Obesity unspecified  INTERVENTION:  - will change TF regimen to Pivot 1.5 @ 25 ml/hr to advance by 10 ml every 12 hours to reach goal rate of 45 ml/hr with 90 ml Prosource TF QID.  - at goal rate, this regimen without kcal from propofol will provide 1940 kcal, 189 grams protein, and 820 ml free water.  - free water flush to continue to be per CCM.  NUTRITION DIAGNOSIS:   Increased nutrient needs related to acute illness, catabolic illness (VQMGQ-67 infection) as evidenced by estimated needs -ongoing  GOAL:   Provide needs based on ASPEN/SCCM guidelines -to be met with TF regimen  MONITOR:   Vent status, TF tolerance, Labs, Weight trends  ASSESSMENT:   59 year old male with no significant medical history. He was found to be COVID-19 positive on 11/26. He presented to [facility] with profound hypoxemia on 11/28 and was emergently intubated in the ED; found to be in ARDS and transferred to [facility].  Patient remains intubated with OGT in place. Patient was proned yesterday at ~1645 and flipped back to supine this AM.   Able to communicate with RN via secure chat.  Patient receiving Vital High Protein @ 20 ml/hr with 90 ml Prosource TF x6/day and order placed starting at noon for 200 ml free water every 4 hours.  This regimen provides 960 kcal, 174 grams protein, 1601 ml free water.  Weight today is +9.8 kg/22 lb compared to weight on 11/28. Non-pitting edema to BLE documented in the edema section of flow sheet.   Per notes: - stopping NMB today and will monitor if propofol needs switched - possible re-proning depending on ABG - ARDS 2/2 COVID-19 - hyperglycemia--adjusting insulin regimen - begin advancing TF regimen and possible bowel regimen addition    Patient is currently intubated on ventilator support MV: 18.4 L/min Temp (24hrs), Avg:97.5 F (36.4 C), Min:96.3 F (35.7 C), Max:99 F (37.2  C) Propofol: 36 ml/hr (950 kcal)  Labs reviewed; CBGs: 186, 189, 173, 201 mg/dl, BUN: 47 ml/min, creatinine: 1.95 mg/dl, Ca: 8 mg/dl, AST slightly elevated, GFR: 39 ml/min. Phos and Mg not checked today.  Medications reviewed; 100 mg colace BID, 20 mg IV pepcid BID, sliding scale novolog, 5 units lantus BID, 60 mg solu-medrol BID, 1 tablet rena-vite/day, 17 g miralax BID, 100 mg IV remdesivir x1 dose/day x4 days (11/29-12/2).    Diet Order:   Diet Order            Diet NPO time specified  Diet effective now                 EDUCATION NEEDS:   No education needs have been identified at this time  Skin:  Skin Assessment: Reviewed RN Assessment  Last BM:  PTA/unknown  Height:   Ht Readings from Last 1 Encounters:  10/21/20 6' (1.829 m)    Weight:   Wt Readings from Last 1 Encounters:  10/23/20 129.8 kg     Estimated Nutritional Needs:  Kcal:  1900 kcal (20 kcal/kg adjBW) Protein:  180-195 grams Fluid:  >/= 2.2 L/day      Jackson Matin, MS, RD, LDN, CNSC Inpatient Clinical Dietitian RD pager # available in [facility]  After hours/weekend pager # available in [facility]

## 2020-10-24 ENCOUNTER — Inpatient Hospital Stay (HOSPITAL_COMMUNITY): Payer: BC Managed Care – PPO

## 2020-10-24 LAB — GLUCOSE, CAPILLARY
Glucose-Capillary: 147 mg/dL — ABNORMAL HIGH (ref 70–99)
Glucose-Capillary: 156 mg/dL — ABNORMAL HIGH (ref 70–99)
Glucose-Capillary: 164 mg/dL — ABNORMAL HIGH (ref 70–99)
Glucose-Capillary: 169 mg/dL — ABNORMAL HIGH (ref 70–99)
Glucose-Capillary: 193 mg/dL — ABNORMAL HIGH (ref 70–99)
Glucose-Capillary: 197 mg/dL — ABNORMAL HIGH (ref 70–99)

## 2020-10-24 LAB — CBC
HCT: 34.2 % — ABNORMAL LOW (ref 39.0–52.0)
Hemoglobin: 11.3 g/dL — ABNORMAL LOW (ref 13.0–17.0)
MCH: 33.3 pg (ref 26.0–34.0)
MCHC: 33 g/dL (ref 30.0–36.0)
MCV: 100.9 fL — ABNORMAL HIGH (ref 80.0–100.0)
Platelets: 293 10*3/uL (ref 150–400)
RBC: 3.39 MIL/uL — ABNORMAL LOW (ref 4.22–5.81)
RDW: 14.6 % (ref 11.5–15.5)
WBC: 10.3 10*3/uL (ref 4.0–10.5)
nRBC: 0.2 % (ref 0.0–0.2)

## 2020-10-24 LAB — COMPREHENSIVE METABOLIC PANEL
ALT: 28 U/L (ref 0–44)
AST: 32 U/L (ref 15–41)
Albumin: 2.5 g/dL — ABNORMAL LOW (ref 3.5–5.0)
Alkaline Phosphatase: 49 U/L (ref 38–126)
Anion gap: 11 (ref 5–15)
BUN: 79 mg/dL — ABNORMAL HIGH (ref 6–20)
CO2: 23 mmol/L (ref 22–32)
Calcium: 7.8 mg/dL — ABNORMAL LOW (ref 8.9–10.3)
Chloride: 110 mmol/L (ref 98–111)
Creatinine, Ser: 1.77 mg/dL — ABNORMAL HIGH (ref 0.61–1.24)
GFR, Estimated: 44 mL/min — ABNORMAL LOW (ref >60)
Glucose, Bld: 210 mg/dL — ABNORMAL HIGH (ref 70–99)
Potassium: 4.4 mmol/L (ref 3.5–5.1)
Sodium: 144 mmol/L (ref 135–145)
Total Bilirubin: 0.5 mg/dL (ref 0.3–1.2)
Total Protein: 5.4 g/dL — ABNORMAL LOW (ref 6.5–8.1)

## 2020-10-24 LAB — TRIGLYCERIDES: Triglycerides: 290 mg/dL — ABNORMAL HIGH (ref <150)

## 2020-10-24 MED ORDER — DOCUSATE SODIUM 50 MG/5ML PO LIQD: 100.0000 mg | Freq: Two times a day (BID) | ORAL | Status: DC | PRN

## 2020-10-24 MED ORDER — SODIUM CHLORIDE 0.9% FLUSH
10.0000 mL | Freq: Two times a day (BID) | INTRAVENOUS | Status: DC
  Administered 2020-10-24 (×2): 10 mL

## 2020-10-24 MED ORDER — DOCUSATE SODIUM 50 MG/5ML PO LIQD
100.0000 mg | Freq: Two times a day (BID) | ORAL | Status: DC
  Administered 2020-10-24 – 2020-10-28 (×2): 100 mg
  Filled 2020-10-24 (×3): qty 10

## 2020-10-24 MED ORDER — INSULIN GLARGINE 100 UNIT/ML ~~LOC~~ SOLN
10.0000 [IU] | Freq: Two times a day (BID) | SUBCUTANEOUS | Status: DC
  Administered 2020-10-24 – 2020-10-26 (×6): 10 [IU] via SUBCUTANEOUS
  Administered 2020-10-27: 5 [IU] via SUBCUTANEOUS
  Administered 2020-10-27 – 2020-10-28 (×2): 10 [IU] via SUBCUTANEOUS
  Filled 2020-10-24 (×9): qty 0.1

## 2020-10-24 MED ORDER — POLYETHYLENE GLYCOL 3350 17 G PO PACK: 17.0000 g | PACK | Freq: Every day | ORAL | Status: DC | PRN

## 2020-10-24 MED ORDER — MIDAZOLAM HCL 2 MG/2ML IJ SOLN
2.0000 mg | INTRAMUSCULAR | Status: DC | PRN
  Administered 2020-10-25 (×4): 2 mg via INTRAVENOUS

## 2020-10-24 MED ORDER — POLYETHYLENE GLYCOL 3350 17 G PO PACK: 17.0000 g | PACK | Freq: Every day | ORAL | Status: DC

## 2020-10-24 MED ORDER — MIDAZOLAM 50MG/50ML (1MG/ML) PREMIX INFUSION
0.5000 mg/h | INTRAVENOUS | Status: DC
  Administered 2020-10-24 (×2): 10 mg/h via INTRAVENOUS
  Administered 2020-10-24: 5 mg/h via INTRAVENOUS
  Administered 2020-10-24 – 2020-10-25 (×4): 10 mg/h via INTRAVENOUS
  Filled 2020-10-24 (×7): qty 50

## 2020-10-24 MED ORDER — SODIUM CHLORIDE 0.9% FLUSH: 10.0000 mL | INTRAVENOUS | Status: DC | PRN

## 2020-10-24 MED ORDER — MIDAZOLAM HCL 2 MG/2ML IJ SOLN: 2.0000 mg | INTRAMUSCULAR | Status: DC | PRN

## 2020-10-24 NOTE — Progress Notes (Signed)
NAME:  Jackson Figueroa, MRN:  [id number], DOB:  Apr 28, 1961, LOS: 4 ADMISSION DATE:  10/20/2020, CONSULTATION DATE: 11/29 REFERRING MD: Dr. Sabra Heck EDP, CHIEF COMPLAINT: ARDS, COVID-19  Brief History   59 year old male with no significant past medical history and a positive COVID-19 test on 11/26 presented to [facility] with profound hypoxemia on 11/28.  Intubated immediately upon arrival found to be in ARDS.  Transferred to [facility] long hospital for ongoing ICU care in the early a.m. hours of 1/29.   Past Medical History   has a past medical history of Obesity.  Significant Hospital Events   11/28 [facility] emergency department where he was promptly intubated for hypoxemia and airway protection.  In the emergency department he was started on empiric antibiotics and developed shock requiring norepinephrine infusion.  He was transferred to [facility] for ICU admission.  Therapeutics for COVID-19 initiated include dexamethasone and remdesivir. 11/29 shock state persisted. Central line placed, ARDS protocol continued, proning initiated 11/30: Pressor requirements continued, neuromuscular blockade initiated to facilitate ventilation tube feeds started, initiated proning 12/1: Stopping neuromuscular blockade, proning positioning protocol discontinued, added free water for rising creatinine and sodium 12/2: PEEP trending down, maintaining pulse oximetry.  Renal function improved   Consults:    Procedures:  ETT 11/28 > CVL 11/29 >  Significant Diagnostic Tests:  Echo 11/29 > LVEF 50-55%, mild LVH, RV systolic function mildly reduced. Mild dilation of ascending aorta 70mm.   Micro Data:  Blood 11/29 >  Antimicrobials:  CTX 11/28 > 11/29 Azithromycin 11/28 > 11/29  Interim history/subjective:  No acute issues overnight but now having diarrhea after bowel regimen adjusted Objective   Blood pressure 128/61, pulse 64, temperature 98.1 F (36.7 C), resp. rate (Abnormal) 35,  height 6' (1.829 m), weight 129.8 kg, SpO2 92 %.    Vent Mode: PRVC FiO2 (%):  [50 %] 50 % Set Rate:  [35 bmp] 35 bmp Vt Set:  [540 mL] 540 mL PEEP:  [14 cmH20-16 cmH20] 14 cmH20 Plateau Pressure:  [28 cmH20-29 cmH20] 28 cmH20   Intake/Output Summary (Last 24 hours) at 10/24/2020 0817 Last data filed at 10/24/2020 0800 Gross per 24 hour  Intake 4297.33 ml  Output 2400 ml  Net 1897.33 ml   Filed Weights   10/20/20 2045 10/23/20 0500 10/24/20 0500  Weight: 120 kg 129.8 kg 129.8 kg    Examination:  General sedated on full vent support HENT periorbital edema improved and almost completely resolved. Orally intubated. OGT an ETT in place Pulm: decreased and equal bilateral breath sounds. vent settings: VT 540/rr 35 peep 14 fio2 50%; sats 92% pplat 26 driving pressure  Card RRR HR 56  Neuro heavily sedated; RASS -5, BHISS 42 still on Prop and fent gtts but Tgy rising.  GU clear yellow via foley cath GI + bowel sounds. Now w/ flexiseal and liq stools.   Resolved Hospital Problem list    Septic shock resolved Assessment & Plan:   Acute hypoxic respiratory failure with ARDS secondary to COVID-19. Positive outpatient test reportedly 11/26.  -PEEP and FiO2 requirements continue to improve -Portable chest x-ray personally reviewed by myself shows the endotracheal tube in satisfactory position, as is the right IJ triple-lumen catheter.  He continues to have bilateral diffuse pulmonary infiltrates comparing previous film perhaps a little worse aeration than film on 11/30 which could simply represent worsening atelectasis Plan Continuing low tidal ventilation, he is currently at 7 cc/kg with acceptable plateau pressures  Plateau pressure goal less  than 30, driving pressure goal less than 15  Continue to wean PEEP and FiO2 for saturation greater than 88% and PaO2 greater than 65  PF ratio goal greater than 150  Discontinuing propofol (his triglycerides continue to climb), changing to Versed,  will continue RASS goal at -5 today, once PEEP less than 12 will change to -3  We will get ABG in a.m.  Solu-Medrol 1 mg/kg day #5 Will initiate taper effective 12/3  Day #5 of 5 remdesivir  A.m. chest x-ray  Might consider diuretics in the next 24 hours   AKI -Suspect secondary to insensible loss -Free water added via tube, creatinine improving Plan Continue current free water replacement Strict intake output A.m. chemistry  Hyperglycemia: steroid induced.  -Added Lantus on 12/1, glycemic control still not at goal Plan Continue sliding scale insulin Increase Lantus to 10 units every 12 hours Blood glucose goal 140-180  Nutrition: Plan Continue tube feeds   Diarrhea, started after initiating aggressive bowel regimen Plan We will back down on laxatives  Dilation of ascending aorta on Echo 11/29 - warrants further workup if he survives this critical illness.   Best practice (evaluated daily)   Diet: NPO, TF Pain/Anxiety/Delirium protocol (if indicated): Propofol and Fentanyl, changing propofol to Versed on 12/2 VAP protocol (if indicated): Per protocol DVT prophylaxis: Enoxaparin GI prophylaxis: Pepcid Glucose control: SSI  Mobility: Bedrest last date of multidisciplinary goals of care discussion: 11/29  Family and staff present: Pending, have attempted twice to contact sister with no success; His secondary contact is Ms. Deneise Lever noted his significant other will reach out to her today if unable to contact Kathie Rhodes the patient's sister Summary of discussion Full code, if clinical situation worsens they know we will readdress.  Follow up goals of care discussion due: 12/5 Code Status: FULL Disposition: ICU   Critical care time: 32 minutes     Erick Colace ACNP-BC [place] Pager # [phone] OR # [phone] if no answer

## 2020-10-24 NOTE — Plan of Care (Signed)

## 2020-10-25 ENCOUNTER — Inpatient Hospital Stay (HOSPITAL_COMMUNITY): Payer: BC Managed Care – PPO

## 2020-10-25 LAB — COMPREHENSIVE METABOLIC PANEL
ALT: 34 U/L (ref 0–44)
AST: 43 U/L — ABNORMAL HIGH (ref 15–41)
Albumin: 2.5 g/dL — ABNORMAL LOW (ref 3.5–5.0)
Alkaline Phosphatase: 50 U/L (ref 38–126)
Anion gap: 9 (ref 5–15)
BUN: 79 mg/dL — ABNORMAL HIGH (ref 6–20)
CO2: 24 mmol/L (ref 22–32)
Calcium: 8.2 mg/dL — ABNORMAL LOW (ref 8.9–10.3)
Chloride: 114 mmol/L — ABNORMAL HIGH (ref 98–111)
Creatinine, Ser: 1.29 mg/dL — ABNORMAL HIGH (ref 0.61–1.24)
GFR, Estimated: >60 mL/min (ref >60)
Glucose, Bld: 221 mg/dL — ABNORMAL HIGH (ref 70–99)
Potassium: 4.7 mmol/L (ref 3.5–5.1)
Sodium: 147 mmol/L — ABNORMAL HIGH (ref 135–145)
Total Bilirubin: 0.6 mg/dL (ref 0.3–1.2)
Total Protein: 5.7 g/dL — ABNORMAL LOW (ref 6.5–8.1)

## 2020-10-25 LAB — GLUCOSE, CAPILLARY
Glucose-Capillary: 113 mg/dL — ABNORMAL HIGH (ref 70–99)
Glucose-Capillary: 117 mg/dL — ABNORMAL HIGH (ref 70–99)
Glucose-Capillary: 133 mg/dL — ABNORMAL HIGH (ref 70–99)
Glucose-Capillary: 154 mg/dL — ABNORMAL HIGH (ref 70–99)
Glucose-Capillary: 163 mg/dL — ABNORMAL HIGH (ref 70–99)
Glucose-Capillary: 187 mg/dL — ABNORMAL HIGH (ref 70–99)

## 2020-10-25 LAB — TRIGLYCERIDES: Triglycerides: 273 mg/dL — ABNORMAL HIGH (ref <150)

## 2020-10-25 LAB — BLOOD GAS, ARTERIAL
Acid-Base Excess: 0.4 mmol/L (ref 0.0–2.0)
Bicarbonate: 25.2 mmol/L (ref 20.0–28.0)
FIO2: 40
O2 Saturation: 91.1 %
Patient temperature: 98.4
pCO2 arterial: 43.2 mmHg (ref 32.0–48.0)
pH, Arterial: 7.383 (ref 7.350–7.450)
pO2, Arterial: 62.8 mmHg — ABNORMAL LOW (ref 83.0–108.0)

## 2020-10-25 LAB — CBC
HCT: 35.8 % — ABNORMAL LOW (ref 39.0–52.0)
Hemoglobin: 11.7 g/dL — ABNORMAL LOW (ref 13.0–17.0)
MCH: 33.2 pg (ref 26.0–34.0)
MCHC: 32.7 g/dL (ref 30.0–36.0)
MCV: 101.7 fL — ABNORMAL HIGH (ref 80.0–100.0)
Platelets: 294 10*3/uL (ref 150–400)
RBC: 3.52 MIL/uL — ABNORMAL LOW (ref 4.22–5.81)
RDW: 14.3 % (ref 11.5–15.5)
WBC: 10.4 10*3/uL (ref 4.0–10.5)
nRBC: 0.2 % (ref 0.0–0.2)

## 2020-10-25 LAB — CULTURE, BLOOD (ROUTINE X 2)
Culture: NO GROWTH
Culture: NO GROWTH
Special Requests: ADEQUATE
Special Requests: ADEQUATE

## 2020-10-25 MED ORDER — HYDROMORPHONE BOLUS VIA INFUSION
0.5000 mg | INTRAVENOUS | Status: DC | PRN
  Administered 2020-10-25 – 2020-11-03 (×19): 0.5 mg via INTRAVENOUS
  Filled 2020-10-25: qty 1

## 2020-10-25 MED ORDER — SODIUM CHLORIDE 0.9 % IV SOLN
0.5000 mg/h | INTRAVENOUS | Status: DC
  Administered 2020-10-25: 1 mg/h via INTRAVENOUS
  Administered 2020-10-26: 3 mg/h via INTRAVENOUS
  Administered 2020-10-26: 4 mg/h via INTRAVENOUS
  Administered 2020-10-27: 3.5 mg/h via INTRAVENOUS
  Administered 2020-10-28: 2.5 mg/h via INTRAVENOUS
  Administered 2020-10-28 – 2020-10-29 (×2): 3 mg/h via INTRAVENOUS
  Administered 2020-10-30: 4 mg/h via INTRAVENOUS
  Administered 2020-10-30: 3 mg/h via INTRAVENOUS
  Administered 2020-10-31 – 2020-11-03 (×6): 4 mg/h via INTRAVENOUS
  Filled 2020-10-25 (×23): qty 5

## 2020-10-25 MED ORDER — DEXMEDETOMIDINE HCL IN NACL 200 MCG/50ML IV SOLN
0.4000 ug/kg/h | INTRAVENOUS | Status: DC
  Administered 2020-10-25: 0.4 ug/kg/h via INTRAVENOUS
  Filled 2020-10-25: qty 50

## 2020-10-25 MED ORDER — DOCUSATE SODIUM 50 MG/5ML PO LIQD: 100.0000 mg | Freq: Two times a day (BID) | ORAL | Status: DC

## 2020-10-25 MED ORDER — OXYCODONE HCL 5 MG/5ML PO SOLN
10.0000 mg | Freq: Four times a day (QID) | ORAL | Status: DC
  Administered 2020-10-25 – 2020-11-04 (×39): 10 mg
  Filled 2020-10-25 (×39): qty 10

## 2020-10-25 MED ORDER — CHLORDIAZEPOXIDE HCL 25 MG PO CAPS: 25.0000 mg | ORAL_CAPSULE | Freq: Every day | ORAL | Status: DC

## 2020-10-25 MED ORDER — FREE WATER
200.0000 mL | Status: DC
  Administered 2020-10-25 – 2020-10-27 (×16): 200 mL

## 2020-10-25 MED ORDER — HYDROMORPHONE HCL 1 MG/ML IJ SOLN
1.0000 mg | Freq: Once | INTRAMUSCULAR | Status: AC
  Administered 2020-10-25: 1 mg via INTRAVENOUS
  Filled 2020-10-25: qty 1

## 2020-10-25 MED ORDER — ADULT MULTIVITAMIN W/MINERALS CH
1.0000 | ORAL_TABLET | Freq: Every day | ORAL | Status: DC
  Administered 2020-10-25 – 2020-10-26 (×2): 1
  Filled 2020-10-25 (×2): qty 1

## 2020-10-25 MED ORDER — MIDAZOLAM BOLUS VIA INFUSION
2.0000 mg | INTRAVENOUS | Status: DC | PRN
  Administered 2020-10-25 – 2020-10-26 (×3): 2 mg via INTRAVENOUS
  Filled 2020-10-25: qty 2

## 2020-10-25 MED ORDER — PREDNISONE 5 MG/5ML PO SOLN
40.0000 mg | Freq: Every day | ORAL | Status: DC
  Administered 2020-10-25: 40 mg
  Filled 2020-10-25 (×2): qty 40

## 2020-10-25 MED ORDER — CISATRACURIUM BESYLATE 20 MG/10ML IV SOLN
0.1000 mg/kg | INTRAVENOUS | Status: DC | PRN
  Administered 2020-10-26: 13 mg via INTRAVENOUS
  Filled 2020-10-25: qty 10

## 2020-10-25 MED ORDER — PIVOT 1.5 CAL PO LIQD
1000.0000 mL | ORAL | Status: DC
  Administered 2020-10-25 – 2020-11-02 (×5): 1000 mL
  Filled 2020-10-25 (×15): qty 1000

## 2020-10-25 MED ORDER — POLYETHYLENE GLYCOL 3350 17 G PO PACK
17.0000 g | PACK | Freq: Every day | ORAL | Status: DC
  Administered 2020-10-31 – 2020-11-04 (×4): 17 g
  Filled 2020-10-25 (×6): qty 1

## 2020-10-25 MED ORDER — CISATRACURIUM BESYLATE (PF) 10 MG/5ML IV SOLN
0.1000 mg/kg | INTRAVENOUS | Status: DC | PRN
  Filled 2020-10-25: qty 6.5

## 2020-10-25 MED ORDER — CHLORDIAZEPOXIDE HCL 25 MG PO CAPS
25.0000 mg | ORAL_CAPSULE | Freq: Four times a day (QID) | ORAL | Status: AC
  Administered 2020-10-25 (×4): 25 mg
  Filled 2020-10-25 (×4): qty 1

## 2020-10-25 MED ORDER — CHLORDIAZEPOXIDE HCL 25 MG PO CAPS: 25.0000 mg | ORAL_CAPSULE | ORAL | Status: DC

## 2020-10-25 MED ORDER — CHLORDIAZEPOXIDE HCL 25 MG PO CAPS
25.0000 mg | ORAL_CAPSULE | Freq: Three times a day (TID) | ORAL | Status: DC
  Administered 2020-10-26: 25 mg
  Filled 2020-10-25: qty 1

## 2020-10-25 MED ORDER — MIDAZOLAM 50MG/50ML (1MG/ML) PREMIX INFUSION
0.5000 mg/h | INTRAVENOUS | Status: DC
  Administered 2020-10-25 (×2): 10 mg/h via INTRAVENOUS
  Administered 2020-10-26: 9 mg/h via INTRAVENOUS
  Administered 2020-10-26: 10 mg/h via INTRAVENOUS
  Administered 2020-10-26: 8 mg/h via INTRAVENOUS
  Administered 2020-10-26 (×2): 9 mg/h via INTRAVENOUS
  Administered 2020-10-26: 10 mg/h via INTRAVENOUS
  Administered 2020-10-27: 4 mg/h via INTRAVENOUS
  Administered 2020-10-27: 10 mg/h via INTRAVENOUS
  Administered 2020-10-27: 9 mg/h via INTRAVENOUS
  Administered 2020-10-28 (×2): 7 mg/h via INTRAVENOUS
  Administered 2020-10-28 – 2020-10-29 (×6): 6.5 mg/h via INTRAVENOUS
  Administered 2020-10-30: 7.5 mg/h via INTRAVENOUS
  Administered 2020-10-30: 6.5 mg/h via INTRAVENOUS
  Administered 2020-10-30: 7 mg/h via INTRAVENOUS
  Administered 2020-10-31 – 2020-11-02 (×11): 7.5 mg/h via INTRAVENOUS
  Administered 2020-11-03: 06:00:00 8 mg/h via INTRAVENOUS
  Filled 2020-10-25 (×34): qty 50

## 2020-10-25 MED ORDER — DEXMEDETOMIDINE HCL IN NACL 200 MCG/50ML IV SOLN: 0.0000 ug/kg/h | INTRAVENOUS | Status: DC

## 2020-10-25 NOTE — Progress Notes (Signed)
Pt has been very dyssynchronous on the ventilator over the last 45 mins; O2 desat to 77%. FiO2 increased by this RN up to 60%; RT aware. Pt was tachypneaic and tachycardic. A total of 4, 50 mcg  fentanyl boluses given at appropriate intervals as well as a 2 mg versed bolus  Per CCM verbal order. Marni Griffon, critical care NP assessed pt at bedside with RN. Orders received for a one time dose of dilaudid as well as a gtt for ventilator synchrony. Librium and oxycodone also scheduled per tube.This RN will continue to carefully monitor pt.

## 2020-10-25 NOTE — Progress Notes (Addendum)
This RN just suctioned a copious amount of thick, tan secretions from patient's airway and oral cavity. Lung sounds remain clear at this time. D/t concern for aspiration, this RN paused tube feeds and notified CCM. Awaiting further orders at this time.

## 2020-10-25 NOTE — Plan of Care (Signed)
This RN will continue to monitor patient's progression of care plan.  

## 2020-10-25 NOTE — TOC Progression Note (Signed)
Transition of Care [facility]) - Progression Note    Patient Details  Name: Jackson Figueroa MRN: [id number] Date of Birth: 1961/07/21  Transition of Care [facility]) CM/SW Contact  Leeroy Cha, RN Phone Number: 10/25/2020, 8:22 AM  Clinical Narrative:    Gen:       Intubated, sedated HEENT:  ETT to vent Lungs:    shallow respirations.  CV:         Regular rate and rhythm; no murmurs Abd:        Obese, soft Ext:         No edema; adequate peripheral perfusion Skin:       Warm and dry; no rash Neuro:    intubated, sedated Acute hypoxemic respiratory failure COVID 19 pneumonia with severe ARDS AKI  P/F ratio >180 now. Doing well off NBM will switch from propofol to versed due to elevated triglycerides. Wean PEEP. Continue tube feeds and free H20 flushes. He is slowly improving. Will try lifting sedation more starting tomorrow.  PLAN May need snf due to deconditioning and vent time Progression: as above and Iv solu medrol, iv sedation, bld culture x2x5d wnl, bun 79/creat1.29.  Expected Discharge Plan: Home/Self Care    Expected Discharge Plan and Services Expected Discharge Plan: Home/Self Care   Discharge Planning Services: CM Consult   Living arrangements for the past 2 months: Single Family Home                                       Social Determinants of Health (SDOH) Interventions    Readmission Risk Interventions No flowsheet data found.

## 2020-10-25 NOTE — Progress Notes (Signed)
Nutrition Follow-up  DOCUMENTATION CODES:   Obesity unspecified  INTERVENTION:  - once TF able to be re-started: Pivot 1.5 @ 20 ml/hr and advance by 10 ml every 12 hours to reach goal rate of 55 ml/hr with 90 ml Prosource TF QID.  - at goal rate, this regimen will provide 2300 kcal, 212 grams protein, and 1002 ml free water.  -free water flush to continue to be per CCM.    NUTRITION DIAGNOSIS:   Increased nutrient needs related to acute illness, catabolic illness (COVID-19 infection) as evidenced by estimated needs. -ongoing  GOAL:   Provide needs based on ASPEN/SCCM guidelines -to be met with TF regimen  MONITOR:   Vent status, TF tolerance, Labs, Weight trends  ASSESSMENT:   59-year-old male with no significant medical history. He was found to be COVID-19 positive on 11/26. He presented to [place] with profound hypoxemia on 11/28 and was emergently intubated in the ED; found to be in ARDS and transferred to [facility].  Patient discussed in rounds this AM.   Patient remains intubated with OGT in place. Able to talk with RN after rounds. HOB >30 degrees and bed in reverse trendelenburg. Patient has been having copious amount of secretions throughout shift and secretions are tan in color. TF has been held since earlier in the shift. Plan, per rounds, is to obtain CXR and hold TF for a few hours and then re-assess, likely re-start TF.  Patient now off propofol.   Weight yesterday exactly the same as on 12/1. He has not been weighed today. Non-pitting edema to all extremities noted in the edema section of flow sheet.   Orders currently in place for Pivot 1.5 @ 45 ml/hr with 90 ml Prosource QID and 200 ml free water every 3 hours. This regimen provides 1940 kcal, 189 grams protein, and 2420 ml free water.   Patient is currently intubated on ventilator support MV: 16.6 L/min Temp (24hrs), Avg:98.5 F (36.9 C), Min:97.5 F (36.4 C), Max:100 F (37.8 C) Propofol: none    Labs reviewed; CBGs: 187 and 163 mg/dl, Na: 147 mmol/l, Cl: 114 mmol/l, BUN: 79 mg/dl, creatinine: 1.29 mg/dl, Ca: 8.2 mg/dl. Medications reviewed; 100 mg colace BID, 20 mg IV pepcid BID, sliding scale novolog, 10 units lantus BID, 1 tablet rena-vit/day, 17 g miralax/day, 40 mg prednisone/day,  Drips; versed @ 10 mg/hr  Diet Order:   Diet Order            Diet NPO time specified  Diet effective now                 EDUCATION NEEDS:   No education needs have been identified at this time  Skin:  Skin Assessment: Reviewed RN Assessment  Last BM:  12/3 (300 ml via flexiseal)  Height:   Ht Readings from Last 1 Encounters:  10/21/20 6' (1.829 m)    Weight:   Wt Readings from Last 1 Encounters:  10/24/20 129.8 kg    Estimated Nutritional Needs:  Kcal:  2375 kcal (25 kcal/kg adjBW) Protein:  195-205 grams Fluid:  >/= 2.2 L/day      , MS, RD, LDN, CNSC Inpatient Clinical Dietitian RD pager # available in AMION  After hours/weekend pager # available in AMION  

## 2020-10-25 NOTE — Progress Notes (Signed)
[place] Progress Note Patient Name: Christipher Rieger DOB: 12-Nov-1961 MRN: [id number]   Date of Service  10/25/2020  HPI/Events of Note  Patient with sub-optimal sedation on maximum dose Versed infusion + PRN Versed boluses + dilaudid infusion and he is having episodic desaturation.  eICU Interventions  Precedex infusion added, as well as PRN Nimbex.        Kerry Kass Najla Aughenbaugh 10/25/2020, 11:08 PM

## 2020-10-25 NOTE — Progress Notes (Signed)
NAME:  Jackson Figueroa, MRN:  [id number], DOB:  1961-05-13, LOS: 5 ADMISSION DATE:  10/20/2020, CONSULTATION DATE: 11/29 REFERRING MD: Dr. Sabra Heck EDP, CHIEF COMPLAINT: ARDS, COVID-19  Brief History   59 year old male with no significant past medical history and a positive COVID-19 test on 11/26 presented to [facility] with profound hypoxemia on 11/28.  Intubated immediately upon arrival found to be in ARDS.  Transferred to [facility] long hospital for ongoing ICU care in the early a.m. hours of 1/29.   Past Medical History   has a past medical history of Obesity.  Significant Hospital Events   11/28 [facility] emergency department where he was promptly intubated for hypoxemia and airway protection.  In the emergency department he was started on empiric antibiotics and developed shock requiring norepinephrine infusion.  He was transferred to [facility] for ICU admission.  Therapeutics for COVID-19 initiated include dexamethasone and remdesivir. 11/29 shock state persisted. Central line placed, ARDS protocol continued, proning initiated 11/30: Pressor requirements continued, neuromuscular blockade initiated to facilitate ventilation tube feeds started, initiated proning 12/1: Stopping neuromuscular blockade, proning positioning protocol discontinued, added free water for rising creatinine and sodium 12/2: PEEP trending down, maintaining pulse oximetry.  Renal function improved. Changed to versed from prop gtt d/t rising triglycerides  12/3 Remdesivir now completed. Peep/FIO2 stable. Free water adjusted. Solumedrol changed to pred 40mg , changing fentanyl to Dilaudid, adding Oxycodone   Consults:    Procedures:  ETT 11/28 > CVL 11/29 >  Significant Diagnostic Tests:  Echo 11/29 > LVEF 50-55%, mild LVH, RV systolic function mildly reduced. Mild dilation of ascending aorta 44mm.   Micro Data:  Blood 11/29 >  Antimicrobials:  CTX 11/28 > 11/29 Azithromycin 11/28 >  11/29  Interim history/subjective:   Objective   Blood pressure (Abnormal) 147/84, pulse 89, temperature 98.6 F (37 C), resp. rate (Abnormal) 25, height 6' (1.829 m), weight 129.8 kg, SpO2 93 %.    Vent Mode: PRVC FiO2 (%):  [40 %-50 %] 40 % Set Rate:  [35 bmp] 35 bmp Vt Set:  [540 mL] 540 mL PEEP:  [10 LPF79-02 cmH20] 10 cmH20 Plateau Pressure:  [22 cmH20-25 cmH20] 24 cmH20   Intake/Output Summary (Last 24 hours) at 10/25/2020 0908 Last data filed at 10/25/2020 0500 Gross per 24 hour  Intake 2248.76 ml  Output 2780 ml  Net -531.24 ml   Filed Weights   10/20/20 2045 10/23/20 0500 10/24/20 0500  Weight: 120 kg 129.8 kg 129.8 kg    Examination:  General 59 year old male patient remains on full ventilatory support he is very dyssynchronous today, will rapidly desaturate down to the 70s after coughing HEENT scleral edema has resolved, he is orally intubated.  No JVD mucous membranes are moist Pulmonary: Scattered rhonchi, respiratory rate in the 40s, markedly dyssynchronous, plateau pressure unable to be calculated however peak pressures in the mid 20 range Cardiac: Tachycardic and tachypneic Abdomen: Soft not tender no organomegaly Extremities: Warm and dry with brisk capillary refill Neuro heavily sedated, remains on high dose of Versed and fentanyl requiring frequent supplementation  Resolved Hospital Problem list    Septic shock resolved Assessment & Plan:   Acute hypoxic respiratory failure with ARDS secondary to COVID-19. Positive outpatient test reportedly 11/26.  pcxr personally reviewed: ett and CVL good position. Bilateral airspace disease. Today looks a little improved on left and more atelectatic on right  -oxygen/peep requirements cont to trend in right direction -Remdesivir completed Plan Cont 7cc/kg PBW as long as Pplat <  30 and driving pressures <91 Wean PEEP/FIO2 for sats >88% Change solumedrol to pred 40mg / begin slow taper Will add libirum taper see if we  can come down on Versed, changing fentanyl to Dilaudid Change RASS goal to -3; slow and steady attempts to wake him ensuring acceptable vent mechanics Will hold off on diuretics for now; but trial of diuretics will likely be needed to get lungs where we need them   Acute metabolic encephalopathy Running into the typical situation with high requirements for sedating medications including high-dose fentanyl and Versed.  He is very dyssynchronous requiring frequent supplementation Plan Continue Versed drip for now Add Librium taper Add oxycodone via tube Changed to Dilaudid IV drip  Mild hypernatremia and HyperChloremia Plan Inc free water to 200 q3 hrs Am chem  AKI -Suspect secondary to insensible loss -Free water added via tube, creatinine improving Plan Adjusting free water as above Strict intake and output  Hyperglycemia: steroid induced.  -increased lantus 12/2 to 10 units bid. Very close to goal  Plan Cont ssi Hold lantus at 10 units bid as decreasing steroids today Goal glucose 140-180   Nutrition: Plan Cont tube feeds  Diarrhea, started after initiating aggressive bowel regimen -> Plan PRN LOC  Dilation of ascending aorta on Echo 11/29 - warrants further workup if he survives this critical illness.   Best practice (evaluated daily)   Diet: NPO, TF Pain/Anxiety/Delirium protocol (if indicated): Propofol and Fentanyl, changing propofol to Versed on 12/2 VAP protocol (if indicated): Per protocol DVT prophylaxis: Enoxaparin GI prophylaxis: Pepcid Glucose control: SSI  Mobility: Bedrest last date of multidisciplinary goals of care discussion: 11/29  Family and staff present: Pending, have attempted twice to contact sister with no success; His secondary contact is Ms. Deneise Lever noted his significant other will reach out to her today if unable to contact Kathie Rhodes the patient's sister Summary of discussion Full code, if clinical situation worsens they know we  will readdress.  Follow up goals of care discussion due: 12/5 Code Status: FULL Disposition: ICU   Critical care time: 31 min      Erick Colace ACNP-BC [place] Pager # [phone] OR # [phone] if no answer

## 2020-10-25 NOTE — Progress Notes (Signed)
Notified by RN that pt. was desaturating, upon arrival low to mid 80's, pt. sx.'d for large amount thick tan sputum by RN and this RT, HME changed out which had moderate amount "plug-like" sputum, minimal air added to cuff, >'d PEEP to +10 from current setting of +8 with FI02 >'d from current of .40 up to .60, sats >'d to mid to low 90's with order of >88%, plan to titrate/wean current settings as tolerated, RT/RN to monitor.

## 2020-10-26 ENCOUNTER — Inpatient Hospital Stay (HOSPITAL_COMMUNITY): Payer: BC Managed Care – PPO

## 2020-10-26 LAB — COMPREHENSIVE METABOLIC PANEL
ALT: 51 U/L — ABNORMAL HIGH (ref 0–44)
AST: 58 U/L — ABNORMAL HIGH (ref 15–41)
Albumin: 2.6 g/dL — ABNORMAL LOW (ref 3.5–5.0)
Alkaline Phosphatase: 59 U/L (ref 38–126)
Anion gap: 10 (ref 5–15)
BUN: 70 mg/dL — ABNORMAL HIGH (ref 6–20)
CO2: 23 mmol/L (ref 22–32)
Calcium: 8.4 mg/dL — ABNORMAL LOW (ref 8.9–10.3)
Chloride: 116 mmol/L — ABNORMAL HIGH (ref 98–111)
Creatinine, Ser: 1.18 mg/dL (ref 0.61–1.24)
GFR, Estimated: >60 mL/min (ref >60)
Glucose, Bld: 125 mg/dL — ABNORMAL HIGH (ref 70–99)
Potassium: 4.6 mmol/L (ref 3.5–5.1)
Sodium: 149 mmol/L — ABNORMAL HIGH (ref 135–145)
Total Bilirubin: 0.9 mg/dL (ref 0.3–1.2)
Total Protein: 6.1 g/dL — ABNORMAL LOW (ref 6.5–8.1)

## 2020-10-26 LAB — CBC
HCT: 39.3 % (ref 39.0–52.0)
Hemoglobin: 12.4 g/dL — ABNORMAL LOW (ref 13.0–17.0)
MCH: 33.1 pg (ref 26.0–34.0)
MCHC: 31.6 g/dL (ref 30.0–36.0)
MCV: 104.8 fL — ABNORMAL HIGH (ref 80.0–100.0)
Platelets: 261 10*3/uL (ref 150–400)
RBC: 3.75 MIL/uL — ABNORMAL LOW (ref 4.22–5.81)
RDW: 14.7 % (ref 11.5–15.5)
WBC: 10.9 10*3/uL — ABNORMAL HIGH (ref 4.0–10.5)
nRBC: 0 % (ref 0.0–0.2)

## 2020-10-26 LAB — GLUCOSE, CAPILLARY
Glucose-Capillary: 103 mg/dL — ABNORMAL HIGH (ref 70–99)
Glucose-Capillary: 173 mg/dL — ABNORMAL HIGH (ref 70–99)
Glucose-Capillary: 210 mg/dL — ABNORMAL HIGH (ref 70–99)
Glucose-Capillary: 181 mg/dL — ABNORMAL HIGH (ref 70–99)
Glucose-Capillary: 160 mg/dL — ABNORMAL HIGH (ref 70–99)
Glucose-Capillary: 145 mg/dL — ABNORMAL HIGH (ref 70–99)

## 2020-10-26 LAB — TRIGLYCERIDES: Triglycerides: 197 mg/dL — ABNORMAL HIGH (ref <150)

## 2020-10-26 MED ORDER — PREDNISONE 5 MG/ML PO CONC
40.0000 mg | Freq: Every day | ORAL | Status: DC
  Administered 2020-10-26 – 2020-11-04 (×10): 40 mg
  Filled 2020-10-26 (×12): qty 8

## 2020-10-26 MED ORDER — CLONAZEPAM 1 MG PO TABS: 2.0000 mg | ORAL_TABLET | Freq: Three times a day (TID) | ORAL | Status: DC

## 2020-10-26 MED ORDER — DEXMEDETOMIDINE HCL IN NACL 400 MCG/100ML IV SOLN
0.4000 ug/kg/h | INTRAVENOUS | Status: DC
  Administered 2020-10-26: 1 ug/kg/h via INTRAVENOUS
  Administered 2020-10-26: 0.9 ug/kg/h via INTRAVENOUS
  Filled 2020-10-26 (×3): qty 100

## 2020-10-26 MED ORDER — SODIUM CHLORIDE 0.9 % IV SOLN
1.0000 mg/kg/h | INTRAVENOUS | Status: DC
  Administered 2020-10-26 – 2020-10-27 (×8): 1 mg/kg/h via INTRAVENOUS
  Filled 2020-10-26 (×13): qty 5

## 2020-10-26 MED ORDER — BARICITINIB 2 MG PO TABS
4.0000 mg | ORAL_TABLET | Freq: Every day | ORAL | Status: DC
  Administered 2020-10-26 – 2020-10-27 (×2): 4 mg
  Filled 2020-10-26 (×2): qty 2

## 2020-10-26 MED ORDER — LINAGLIPTIN 5 MG PO TABS
5.0000 mg | ORAL_TABLET | Freq: Every day | ORAL | Status: DC
  Filled 2020-10-26: qty 1

## 2020-10-26 MED ORDER — CISATRACURIUM BESYLATE 20 MG/10ML IV SOLN
0.1000 mg/kg | INTRAVENOUS | Status: DC | PRN
  Administered 2020-10-27 (×2): 13 mg via INTRAVENOUS
  Filled 2020-10-26 (×3): qty 10

## 2020-10-26 MED ORDER — MIDAZOLAM BOLUS VIA INFUSION
2.0000 mg | INTRAVENOUS | Status: DC | PRN
  Administered 2020-10-27 – 2020-11-03 (×9): 2 mg via INTRAVENOUS
  Filled 2020-10-26: qty 2

## 2020-10-26 MED ORDER — CLONAZEPAM 1 MG PO TABS
2.0000 mg | ORAL_TABLET | Freq: Three times a day (TID) | ORAL | Status: DC
  Administered 2020-10-26 – 2020-11-09 (×41): 2 mg
  Filled 2020-10-26 (×41): qty 2

## 2020-10-26 MED ORDER — LINAGLIPTIN 5 MG PO TABS
5.0000 mg | ORAL_TABLET | Freq: Every day | ORAL | Status: DC
  Administered 2020-10-26 – 2020-12-12 (×48): 5 mg
  Filled 2020-10-26 (×48): qty 1

## 2020-10-26 MED ORDER — ADULT MULTIVITAMIN LIQUID CH: 15.0000 mL | Freq: Every day | ORAL | Status: DC

## 2020-10-26 MED ORDER — ADULT MULTIVITAMIN LIQUID CH
15.0000 mL | Freq: Every day | ORAL | Status: DC
  Administered 2020-10-27 – 2020-11-14 (×19): 15 mL
  Filled 2020-10-26 (×20): qty 15

## 2020-10-26 NOTE — Progress Notes (Signed)
[place] Progress Note Patient Name: Jackson Figueroa DOB: October 04, 1961 MRN: [id number]   Date of Service  10/26/2020  HPI/Events of Note  Patient continues to be dyssynchronous on the ventilator.  eICU Interventions  Precedex infusion increased to 1 mcg, and PRN interval for Versed and Nimbex shortened to 1 hour.        Jackson Figueroa Jackson Figueroa 10/26/2020, 12:48 AM

## 2020-10-26 NOTE — Progress Notes (Signed)
NAME:  Jackson Figueroa, MRN:  [id number], DOB:  1961/04/19, LOS: 6 ADMISSION DATE:  10/20/2020, CONSULTATION DATE:  11/29 REFERRING MD:  Sabra Heck, CHIEF COMPLAINT:  ARDS COVID 4   Brief History   59 year old male with no significant past medical history and a positive COVID-19 test on 11/26 presented to [facility] with profound hypoxemia on 11/28.  Intubated immediately upon arrival found to be in ARDS.  Transferred to [facility] long hospital for ongoing ICU care in the early a.m. hours of 11/29.   Past Medical History  obesity  Significant Hospital Events   11/28 [facility] emergency department where he was promptly intubated for hypoxemia and airway protection.  In the emergency department he was started on empiric antibiotics and developed shock requiring norepinephrine infusion.  He was transferred to [facility] for ICU admission.  Therapeutics for COVID-19 initiated include dexamethasone and remdesivir. 11/29 shock state persisted. Central line placed, ARDS protocol continued, proning initiated 11/30: Pressor requirements continued, neuromuscular blockade initiated to facilitate ventilation tube feeds started, initiated proning 12/1: Stopping neuromuscular blockade, proning positioning protocol discontinued, added free water for rising creatinine and sodium 12/2: PEEP trending down, maintaining pulse oximetry.  Renal function improved. Changed to versed from prop gtt d/t rising triglycerides  12/3 Remdesivir now completed. Peep/FIO2 stable. Free water adjusted. Solumedrol changed to pred 40mg , changing fentanyl to Dilaudid, adding Oxycodone   Consults:    Procedures:  ETT 11/28 > CVL 11/29 >  Significant Diagnostic Tests:  Echo 11/29 > LVEF 50-55%, mild LVH, RV systolic function mildly reduced. Mild dilation of ascending aorta 7mm.  Micro Data:  Blood 11/29 > Urine 11/28 >  SARS COV2/Flu > negative  Antimicrobials:  CTX 11/28 > 11/29 Azithromycin 11/28 >  11/29  remdesivir 11/28 >  Prednisone 11/28 >  baricitinib 12/4 >   Interim history/subjective:  Severe dyssynchrony overnight Intermittent paralytics used again  Objective   Blood pressure 119/64, pulse 62, temperature 97.9 F (36.6 C), resp. rate (!) 37, height 6' (1.829 m), weight 127.2 kg, SpO2 90 %.    Vent Mode: PRVC FiO2 (%):  [40 %-65 %] 65 % Set Rate:  [35 bmp] 35 bmp Vt Set:  [540 mL] 540 mL PEEP:  [8 cmH20-10 cmH20] 10 cmH20 Plateau Pressure:  [23 cmH20-25 cmH20] 24 cmH20   Intake/Output Summary (Last 24 hours) at 10/26/2020 8563 Last data filed at 10/26/2020 1497 Gross per 24 hour  Intake 4130.42 ml  Output 3425 ml  Net 705.42 ml   Filed Weights   10/23/20 0500 10/24/20 0500 10/26/20 0433  Weight: 129.8 kg 129.8 kg 127.2 kg    Examination:  General:  In bed on vent HENT: NCAT ETT in place PULM: CTA B, vent supported breathing CV: RRR, no mgr GI: BS+, soft, nontender MSK: normal bulk and tone Neuro: sedated on vent    Resolved Hospital Problem list     Assessment & Plan:  ARDS due to COVID 19 pneumonia> stable but not significantly improving Continue mechanical ventilation per ARDS protocol Target TVol 6-8cc/kgIBW Target Plateau Pressure < 30cm H20 Target driving pressure less than 15 cm of water Target PaO2 55-65: titrate PEEP/FiO2 per protocol As long as PaO2 to FiO2 ratio is less than 1:150 position in prone position for 16 hours a day Check CVP daily if CVL in place Target CVP less than 4, diurese as necessary Ventilator associated pneumonia prevention protocol 12/4 no contraindications to baricitinib, may be of limited value at this point but will  start as there are no contraindications.  Attempted to call his sister to discuss but there was no answer.  I have consulted with experienced COVID 19 physicians in our health system who agree it would be beneficial to give to him.  Continue prednisone  Severe ventilator dyssynchrony Acute  metabolic encephalopathy Change librium to clonazepam Stop precedex Add ketamine Continue oxycodone Continue versed and dilaudid Prn vec> minimize RASS target -5  Hypernatremia > stable Continue free water Monitor BMET and UOP Replace electrolytes as needed  Hyperglycemia glargine ssi Add linagliptin  Nutrition Continue tube feeding  AKI improving  Diarrhea stable    Best practice (evaluated daily)   Diet: tube feeding Pain/Anxiety/Delirium protocol (if indicated): as above VAP protocol (if indicated): yes DVT prophylaxis: lovenox GI prophylaxis: famotidine Glucose control: as above Mobility: bed rest last date of multidisciplinary goals of care discussion I attempted to call his sister today, could not leave a message; social work consulted to help find family Family and staff present n/a Summary of discussion n/a Follow up goals of care discussion due 12/6 Code Status: full Disposition: remain in ICU  Labs   CBC: Recent Labs  Lab 10/20/20 2042 10/20/20 2042 10/22/20 0338 10/23/20 0518 10/24/20 0612 10/25/20 0400 10/26/20 0430  WBC 6.5   < > 13.6* 11.1* 10.3 10.4 10.9*  NEUTROABS 4.6  --   --   --   --   --   --   HGB 14.4   < > 14.0 12.4* 11.3* 11.7* 12.4*  HCT 41.7   < > 41.3 37.2* 34.2* 35.8* 39.3  MCV 96.1   < > 97.6 100.3* 100.9* 101.7* 104.8*  PLT 230   < > 275 276 293 294 261   < > = values in this interval not displayed.    Basic Metabolic Panel: Recent Labs  Lab 10/22/20 0338 10/23/20 0518 10/24/20 0612 10/25/20 0400 10/26/20 0430  NA 140 143 144 147* 149*  K 4.9 4.7 4.4 4.7 4.6  CL 104 106 110 114* 116*  CO2 26 30 23 24 23   GLUCOSE 170* 190* 210* 221* 125*  BUN 31* 47* 79* 79* 70*  CREATININE 1.45* 1.95* 1.77* 1.29* 1.18  CALCIUM 8.1* 8.0* 7.8* 8.2* 8.4*  MG 2.6*  --   --   --   --   PHOS 5.0*  --   --   --   --    GFR: Estimated Creatinine Clearance: 95.2 mL/min (by C-G formula based on SCr of 1.18 mg/dL). Recent Labs   Lab 10/20/20 2042 10/20/20 2314 10/22/20 0338 10/23/20 0518 10/24/20 0612 10/25/20 0400 10/26/20 0430  PROCALCITON 0.31  --   --   --   --   --   --   WBC 6.5  --    < > 11.1* 10.3 10.4 10.9*  LATICACIDVEN 1.8 1.3  --   --   --   --   --    < > = values in this interval not displayed.    Liver Function Tests: Recent Labs  Lab 10/22/20 0338 10/23/20 0518 10/24/20 0612 10/25/20 0400 10/26/20 0430  AST 79* 45* 32 43* 58*  ALT 42 33 28 34 51*  ALKPHOS 63 59 49 50 59  BILITOT 0.5 0.4 0.5 0.6 0.9  PROT 6.7 6.1* 5.4* 5.7* 6.1*  ALBUMIN 3.0* 2.8* 2.5* 2.5* 2.6*   No results for input(s): LIPASE, AMYLASE in the last 168 hours. No results for input(s): AMMONIA in the last 168 hours.  ABG    Component Value Date/Time   PHART 7.383 10/25/2020 0350   PCO2ART 43.2 10/25/2020 0350   PO2ART 62.8 (L) 10/25/2020 0350   HCO3 25.2 10/25/2020 0350   ACIDBASEDEF 2.0 10/23/2020 0930   O2SAT 91.1 10/25/2020 0350     Coagulation Profile: Recent Labs  Lab 10/20/20 2042  INR 1.0    Cardiac Enzymes: No results for input(s): CKTOTAL, CKMB, CKMBINDEX, TROPONINI in the last 168 hours.  HbA1C: Hgb A1c MFr Bld  Date/Time Value Ref Range Status  10/22/2020 03:38 AM 5.3 4.8 - 5.6 % Final    Comment:    (NOTE)         Prediabetes: 5.7 - 6.4         Diabetes: >6.4         Glycemic control for adults with diabetes: <7.0     CBG: Recent Labs  Lab 10/25/20 1548 10/25/20 1936 10/25/20 2332 10/26/20 0403 10/26/20 0758  GLUCAP 133* 154* 113* 103* 173*     Critical care time: 35 minutes    Roselie Awkward, MD [place] [facility] Pager: [phone] Cell: [phone] If no response, call [phone]

## 2020-10-27 ENCOUNTER — Inpatient Hospital Stay (HOSPITAL_COMMUNITY): Payer: BC Managed Care – PPO

## 2020-10-27 LAB — COMPREHENSIVE METABOLIC PANEL
ALT: 43 U/L (ref 0–44)
AST: 36 U/L (ref 15–41)
Albumin: 2.3 g/dL — ABNORMAL LOW (ref 3.5–5.0)
Alkaline Phosphatase: 53 U/L (ref 38–126)
Anion gap: 9 (ref 5–15)
BUN: 76 mg/dL — ABNORMAL HIGH (ref 6–20)
CO2: 23 mmol/L (ref 22–32)
Calcium: 8.6 mg/dL — ABNORMAL LOW (ref 8.9–10.3)
Chloride: 119 mmol/L — ABNORMAL HIGH (ref 98–111)
Creatinine, Ser: 1.17 mg/dL (ref 0.61–1.24)
GFR, Estimated: >60 mL/min (ref >60)
Glucose, Bld: 164 mg/dL — ABNORMAL HIGH (ref 70–99)
Potassium: 4.6 mmol/L (ref 3.5–5.1)
Sodium: 151 mmol/L — ABNORMAL HIGH (ref 135–145)
Total Bilirubin: 0.7 mg/dL (ref 0.3–1.2)
Total Protein: 6 g/dL — ABNORMAL LOW (ref 6.5–8.1)

## 2020-10-27 LAB — GLUCOSE, CAPILLARY
Glucose-Capillary: 127 mg/dL — ABNORMAL HIGH (ref 70–99)
Glucose-Capillary: 119 mg/dL — ABNORMAL HIGH (ref 70–99)
Glucose-Capillary: 138 mg/dL — ABNORMAL HIGH (ref 70–99)
Glucose-Capillary: 122 mg/dL — ABNORMAL HIGH (ref 70–99)
Glucose-Capillary: 118 mg/dL — ABNORMAL HIGH (ref 70–99)
Glucose-Capillary: 121 mg/dL — ABNORMAL HIGH (ref 70–99)

## 2020-10-27 LAB — CBC WITH DIFFERENTIAL/PLATELET
Abs Immature Granulocytes: 0.56 10*3/uL — ABNORMAL HIGH (ref 0.00–0.07)
Basophils Absolute: 0 10*3/uL (ref 0.0–0.1)
Basophils Relative: 0 %
Eosinophils Absolute: 0.1 10*3/uL (ref 0.0–0.5)
Eosinophils Relative: 1 %
HCT: 37.4 % — ABNORMAL LOW (ref 39.0–52.0)
Hemoglobin: 11.6 g/dL — ABNORMAL LOW (ref 13.0–17.0)
Immature Granulocytes: 6 %
Lymphocytes Relative: 7 %
Lymphs Abs: 0.7 10*3/uL (ref 0.7–4.0)
MCH: 32.9 pg (ref 26.0–34.0)
MCHC: 31 g/dL (ref 30.0–36.0)
MCV: 105.9 fL — ABNORMAL HIGH (ref 80.0–100.0)
Monocytes Absolute: 0.6 10*3/uL (ref 0.1–1.0)
Monocytes Relative: 6 %
Neutro Abs: 8.3 10*3/uL — ABNORMAL HIGH (ref 1.7–7.7)
Neutrophils Relative %: 80 %
Platelets: 242 10*3/uL (ref 150–400)
RBC: 3.53 MIL/uL — ABNORMAL LOW (ref 4.22–5.81)
RDW: 14.9 % (ref 11.5–15.5)
WBC: 10.2 10*3/uL (ref 4.0–10.5)
nRBC: 0 % (ref 0.0–0.2)

## 2020-10-27 LAB — TRIGLYCERIDES: Triglycerides: 170 mg/dL — ABNORMAL HIGH (ref <150)

## 2020-10-27 MED ORDER — VANCOMYCIN HCL 2000 MG/400ML IV SOLN
2000.0000 mg | INTRAVENOUS | Status: AC
  Administered 2020-10-27: 2000 mg via INTRAVENOUS
  Filled 2020-10-27: qty 400

## 2020-10-27 MED ORDER — ACETAMINOPHEN 325 MG PO TABS: 650.0000 mg | ORAL_TABLET | ORAL | Status: DC | PRN

## 2020-10-27 MED ORDER — VANCOMYCIN HCL IN DEXTROSE 1-5 GM/200ML-% IV SOLN
1000.0000 mg | Freq: Two times a day (BID) | INTRAVENOUS | Status: DC
  Administered 2020-10-27 – 2020-10-28 (×2): 1000 mg via INTRAVENOUS
  Filled 2020-10-27 (×2): qty 200

## 2020-10-27 MED ORDER — VECURONIUM BROMIDE 10 MG IV SOLR
0.0000 ug/kg/min | Status: DC
  Administered 2020-10-27: 1 ug/kg/min via INTRAVENOUS
  Administered 2020-10-28: 0.4 ug/kg/min via INTRAVENOUS
  Administered 2020-10-28: 0.3 ug/kg/min via INTRAVENOUS
  Administered 2020-10-30: 0.4 ug/kg/min via INTRAVENOUS
  Administered 2020-10-30: 0.3 ug/kg/min via INTRAVENOUS
  Administered 2020-10-31 (×2): 0.4 ug/kg/min via INTRAVENOUS
  Filled 2020-10-27: qty 100
  Filled 2020-10-27: qty 20
  Filled 2020-10-27 (×5): qty 100

## 2020-10-27 MED ORDER — ARTIFICIAL TEARS OPHTHALMIC OINT
1.0000 "application " | TOPICAL_OINTMENT | Freq: Three times a day (TID) | OPHTHALMIC | Status: DC
  Administered 2020-10-27 – 2020-11-04 (×23): 1 via OPHTHALMIC
  Filled 2020-10-27: qty 3.5

## 2020-10-27 MED ORDER — SODIUM CHLORIDE 0.9 % IV SOLN
2.0000 g | Freq: Three times a day (TID) | INTRAVENOUS | Status: DC
  Administered 2020-10-27 – 2020-10-29 (×6): 2 g via INTRAVENOUS
  Filled 2020-10-27 (×6): qty 2

## 2020-10-27 MED ORDER — SODIUM CHLORIDE 0.9 % IV SOLN
2.0000 g | INTRAVENOUS | Status: AC
  Administered 2020-10-27: 2 g via INTRAVENOUS
  Filled 2020-10-27: qty 2

## 2020-10-27 MED ORDER — ACETAMINOPHEN 160 MG/5ML PO SOLN
650.0000 mg | ORAL | Status: DC | PRN
  Administered 2020-10-27 – 2020-11-10 (×7): 650 mg
  Filled 2020-10-27 (×8): qty 20.3

## 2020-10-27 MED ORDER — VECURONIUM BOLUS VIA INFUSION
0.0800 mg/kg | Freq: Once | INTRAVENOUS | Status: AC
  Administered 2020-10-27: 10.2 mg via INTRAVENOUS
  Filled 2020-10-27: qty 11

## 2020-10-27 MED ORDER — LABETALOL HCL 5 MG/ML IV SOLN
10.0000 mg | INTRAVENOUS | Status: AC | PRN
  Administered 2020-10-27 – 2020-11-06 (×10): 10 mg via INTRAVENOUS
  Filled 2020-10-27 (×12): qty 4

## 2020-10-27 MED ORDER — FREE WATER
300.0000 mL | Status: DC
  Administered 2020-10-27 – 2020-10-28 (×12): 300 mL

## 2020-10-27 NOTE — Progress Notes (Signed)
Pharmacy Antibiotic Note  Jackson Figueroa is a 59 y.o. male admitted on 10/20/2020 with COVID PNA requiring intubation. Pharmacy has been consulted for vancomycin + cefepime dosing for PNA.  Today, 10/27/20  WBC WNL  SCr 1.17, CrCl ~90 mL/min  Tmax 102 F  Plan:  Cefepime 2 g IV q8h  Vancomycin 2000 mg LD followed by 1000 mg IV q12h for goal AUC 400-550  Monitor renal function, check vancomycin levels as needed  Height: 6' (182.9 cm) Weight: 127.2 kg (280 lb 6.8 oz) IBW/kg (Calculated) : 77.6  Temp (24hrs), Avg:99.7 F (37.6 C), Min:98.1 F (36.7 C), Max:102 F (38.9 C)  Recent Labs  Lab 10/20/20 2042 10/20/20 2314 10/22/20 0338 10/23/20 0518 10/24/20 0612 10/25/20 0400 10/26/20 0430 10/27/20 0230  WBC 6.5  --    < > 11.1* 10.3 10.4 10.9* 10.2  CREATININE 1.18  --    < > 1.95* 1.77* 1.29* 1.18 1.17  LATICACIDVEN 1.8 1.3  --   --   --   --   --   --    < > = values in this interval not displayed.    Estimated Creatinine Clearance: 96 mL/min (by C-G formula based on SCr of 1.17 mg/dL).    No Known Allergies  Antimicrobials this admission: vancomycin 12/5 >>  cefepime 12/5 >>   Dose adjustments this admission:  Microbiology results: 11/28 BCx: ngF 11/28 UCx: ngF  11/28 MRSA PCR: Negative 11/28 COVID: Positive  Thank you for allowing pharmacy to be a part of this patient's care.  Lenis Noon, PharmD 10/27/2020 1:46 PM

## 2020-10-27 NOTE — Progress Notes (Signed)
[facility]  Aspirated Recurrent severe dyssynchrony Required nimbex again, second dose in 2 hours Since aspiration his FiO2 requirement is higher PEEP higher than yesterday Will order nimbex infusion again overnight Earlier today he was relatively comfortable/synchronous on pressure support 10/10, prior to his aspiration event.   Would attempt that again in the morning assuming his oxygenation has recovered after today's aspiration  Roselie Awkward, MD [place] [facility] Pager: [phone] Cell: [phone] If no response, call [phone]

## 2020-10-27 NOTE — Progress Notes (Signed)
Dr. Lake Bells notified of persistent tachycardia and increasing temp, currently 100.9. Per Dr. Lake Bells, continue to monitor fever now, and if pt shows signs of discomfort or is diaphoretic, give PRN tylenol. Awaiting tylenol and culture orders now, this RN will continue to monitor.

## 2020-10-27 NOTE — Plan of Care (Signed)
RN will continue to carefully monitor patient's progression of care plan.  

## 2020-10-27 NOTE — Progress Notes (Signed)
[place] Progress Note Patient Name: Jackson Figueroa DOB: 28-Dec-1960 MRN: [id number]   Date of Service  10/27/2020  HPI/Events of Note  HR 110, temp 99.7 MAP is OK. Covid -on Rx. Wbc ok.  CxR from AM stable. IMPRESSION: 1. Patchy bilateral airspace opacities, again most prominent at the RIGHT lung base, overall not significantly changed, compatible with multifocal pneumonia.  eICU Interventions  Tylenol for fever.  Watch for now. No need for LA.  Discussed with RN.      Intervention Category Intermediate Interventions: Other:  Elmer Sow 10/27/2020, 6:26 AM

## 2020-10-27 NOTE — Progress Notes (Signed)
NAME:  Jackson Figueroa, MRN:  [id number], DOB:  1961-02-24, LOS: 7 ADMISSION DATE:  10/20/2020, CONSULTATION DATE:  11/29 REFERRING MD:  Sabra Heck, CHIEF COMPLAINT:  ARDS COVID 76   Brief History   59 year old male with no significant past medical history and a positive COVID-19 test on 11/26 presented to [facility] with profound hypoxemia on 11/28.  Intubated immediately upon arrival found to be in ARDS.  Transferred to [facility] long hospital for ongoing ICU care in the early a.m. hours of 11/29.   Past Medical History  obesity  Significant Hospital Events   11/28 [facility] emergency department where he was promptly intubated for hypoxemia and airway protection.  In the emergency department he was started on empiric antibiotics and developed shock requiring norepinephrine infusion.  He was transferred to [facility] for ICU admission.  Therapeutics for COVID-19 initiated include dexamethasone and remdesivir. 11/29 shock state persisted. Central line placed, ARDS protocol continued, proning initiated 11/30: Pressor requirements continued, neuromuscular blockade initiated to facilitate ventilation tube feeds started, initiated proning 12/1: Stopping neuromuscular blockade, proning positioning protocol discontinued, added free water for rising creatinine and sodium 12/2: PEEP trending down, maintaining pulse oximetry.  Renal function improved. Changed to versed from prop gtt d/t rising triglycerides  12/3 Remdesivir now completed. Peep/FIO2 stable. Free water adjusted. Solumedrol changed to pred 40mg , changing fentanyl to Dilaudid, adding Oxycodone  12/4-5 continued dyssynchrony, given paralytic overnight, changed to ketamine, started baricitinib  Consults:    Procedures:  ETT 11/28 > CVL 11/29 >  Significant Diagnostic Tests:  Echo 11/29 > LVEF 50-55%, mild LVH, RV systolic function mildly reduced. Mild dilation of ascending aorta 13mm.  Micro Data:  Blood 11/29 > Urine  11/28 >  SARS COV2/Flu > negative  Antimicrobials:  CTX 11/28 > 11/29 Azithromycin 11/28 > 11/29  remdesivir 11/28 >  Prednisone 11/28 >  baricitinib 12/4 >   12/5 vanc >  12/5 cefepime >   Interim history/subjective:   fever Dyssynchrony continues Seems more comfortable on low rate on mechanical ventilator   Objective   Blood pressure (!) 150/93, pulse (!) 114, temperature 99.7 F (37.6 C), resp. rate (!) 33, height 6' (1.829 m), weight 127.2 kg, SpO2 96 %.    Vent Mode: PRVC FiO2 (%):  [50 %-60 %] 60 % Set Rate:  [35 bmp] 35 bmp Vt Set:  [540 mL-560 mL] 560 mL PEEP:  [8 cmH20-10 cmH20] 10 cmH20 Plateau Pressure:  [21 cmH20-30 cmH20] 27 cmH20   Intake/Output Summary (Last 24 hours) at 10/27/2020 0744 Last data filed at 10/27/2020 0522 Gross per 24 hour  Intake 2146.51 ml  Output 2130 ml  Net 16.51 ml   Filed Weights   10/23/20 0500 10/24/20 0500 10/26/20 0433  Weight: 129.8 kg 129.8 kg 127.2 kg    Examination:  General:  In bed on vent HENT: NCAT ETT in place PULM: Crackles B, vent supported breathing CV: RRR, no mgr GI: BS+, soft, nontender MSK: normal bulk and tone Neuro: sedated on vent  12/5 CXR images reviewed> bibasilar air space disease, slightly worse in appearance compared to 12/4  [facility] Problem list     Assessment & Plan:  ARDS due to COVID 19 pneumonia> stable but not significantly improving Continue mechanical ventilation per ARDS protocol Target TVol 6-8cc/kgIBW Target Plateau Pressure < 30cm H20 Target driving pressure less than 15 cm of water Target PaO2 55-65: titrate PEEP/FiO2 per protocol As long as PaO2 to FiO2 ratio is less than 1:150  position in prone position for 16 hours a day Check CVP daily if CVL in place Target CVP less than 4, diurese as necessary Ventilator associated pneumonia prevention protocol 12/5 continue baricitinib, decrease RR to 20 today, try longer periods of pressure support, asked RT to  communicate to night shift that it's OK to tolerate hypoxemia in the high 70's, low 80's for 15-20 minutes if necessary rather than paralyzing and increasing PEEP/FiO2  Fever, worrisome for HCAP Check resp culture Start vanc/zosyn Stop baricitinib if culture positive  Severe ventilator dyssynchrony Acute metabolic encephalopathy Continue clonazepam Continue oxycodone Continue ketamine Minimize paralytic Continue dilaudid and versed RASS target -5 Continue to adjust ventilator to settings he tolerates more (longer periods of pressure support)  Hypernatremia > worse Increase free water via tube Hold diuretics Monitor BMET and UOP Replace electrolytes as needed  Hyperglycemia Continue glargine, SSI, linagliptin  Nutrition Continue tube feeding  AKI Monitor BMET and UOP Replace electrolytes as needed   Diarrhea monitor    Best practice (evaluated daily)   Diet: tube feeding Pain/Anxiety/Delirium protocol (if indicated): as above VAP protocol (if indicated): yes DVT prophylaxis: lovenox GI prophylaxis: famotidine Glucose control: as above Mobility: bed rest last date of multidisciplinary goals of care discussion I attempted to call his sister today, could not leave a message; social work consulted to help find family Family and staff present n/a Summary of discussion n/a Follow up goals of care discussion due 12/6 Code Status: full Disposition: remain in ICU  Labs   CBC: Recent Labs  Lab 10/20/20 2042 10/22/20 0338 10/23/20 0518 10/24/20 0612 10/25/20 0400 10/26/20 0430 10/27/20 0230  WBC 6.5   < > 11.1* 10.3 10.4 10.9* 10.2  NEUTROABS 4.6  --   --   --   --   --  8.3*  HGB 14.4   < > 12.4* 11.3* 11.7* 12.4* 11.6*  HCT 41.7   < > 37.2* 34.2* 35.8* 39.3 37.4*  MCV 96.1   < > 100.3* 100.9* 101.7* 104.8* 105.9*  PLT 230   < > 276 293 294 261 242   < > = values in this interval not displayed.    Basic Metabolic Panel: Recent Labs  Lab 10/22/20 0338  10/22/20 0338 10/23/20 0518 10/24/20 0612 10/25/20 0400 10/26/20 0430 10/27/20 0230  NA 140   < > 143 144 147* 149* 151*  K 4.9   < > 4.7 4.4 4.7 4.6 4.6  CL 104   < > 106 110 114* 116* 119*  CO2 26   < > 30 23 24 23 23   GLUCOSE 170*   < > 190* 210* 221* 125* 164*  BUN 31*   < > 47* 79* 79* 70* 76*  CREATININE 1.45*   < > 1.95* 1.77* 1.29* 1.18 1.17  CALCIUM 8.1*   < > 8.0* 7.8* 8.2* 8.4* 8.6*  MG 2.6*  --   --   --   --   --   --   PHOS 5.0*  --   --   --   --   --   --    < > = values in this interval not displayed.   GFR: Estimated Creatinine Clearance: 96 mL/min (by C-G formula based on SCr of 1.17 mg/dL). Recent Labs  Lab 10/20/20 2042 10/20/20 2314 10/22/20 0338 10/24/20 0612 10/25/20 0400 10/26/20 0430 10/27/20 0230  PROCALCITON 0.31  --   --   --   --   --   --  WBC 6.5  --    < > 10.3 10.4 10.9* 10.2  LATICACIDVEN 1.8 1.3  --   --   --   --   --    < > = values in this interval not displayed.    Liver Function Tests: Recent Labs  Lab 10/23/20 0518 10/24/20 0612 10/25/20 0400 10/26/20 0430 10/27/20 0230  AST 45* 32 43* 58* 36  ALT 33 28 34 51* 43  ALKPHOS 59 49 50 59 53  BILITOT 0.4 0.5 0.6 0.9 0.7  PROT 6.1* 5.4* 5.7* 6.1* 6.0*  ALBUMIN 2.8* 2.5* 2.5* 2.6* 2.3*   No results for input(s): LIPASE, AMYLASE in the last 168 hours. No results for input(s): AMMONIA in the last 168 hours.  ABG    Component Value Date/Time   PHART 7.383 10/25/2020 0350   PCO2ART 43.2 10/25/2020 0350   PO2ART 62.8 (L) 10/25/2020 0350   HCO3 25.2 10/25/2020 0350   ACIDBASEDEF 2.0 10/23/2020 0930   O2SAT 91.1 10/25/2020 0350     Coagulation Profile: Recent Labs  Lab 10/20/20 2042  INR 1.0    Cardiac Enzymes: No results for input(s): CKTOTAL, CKMB, CKMBINDEX, TROPONINI in the last 168 hours.  HbA1C: Hgb A1c MFr Bld  Date/Time Value Ref Range Status  10/22/2020 03:38 AM 5.3 4.8 - 5.6 % Final    Comment:    (NOTE)         Prediabetes: 5.7 - 6.4          Diabetes: >6.4         Glycemic control for adults with diabetes: <7.0     CBG: Recent Labs  Lab 10/26/20 1303 10/26/20 1609 10/26/20 1949 10/26/20 2309 10/27/20 0346  GLUCAP 210* 181* 160* 145* 127*     Critical care time: 35 minutes    Roselie Awkward, MD [place] [facility] Pager: [phone] Cell: [phone] If no response, call [phone]

## 2020-10-27 NOTE — Progress Notes (Signed)
Pt becoming more hypertensive, despite increasing sedation pt still hypertensive 150's-170's SBP. Notified MD, orders in PRN Labetalol will give if SBP greater than 170.  TF on trickle fed d/t potential aspiration, advised by MD to give 5units of Lantus instead of 10units ordered

## 2020-10-27 NOTE — Plan of Care (Signed)
  Problem: Nutrition: Goal: Adequate nutrition will be maintained Outcome: Progressing   Problem: Clinical Measurements: Goal: Diagnostic test results will improve Outcome: Progressing   Problem: Education: Goal: Knowledge of risk factors and measures for prevention of condition will improve Outcome: Progressing   Problem: Skin Integrity: Goal: Risk for impaired skin integrity will decrease Outcome: Progressing   Problem: Respiratory: Goal: Will maintain a patent airway Outcome: Progressing   Problem: Elimination: Goal: Will not experience complications related to bowel motility Outcome: Progressing   Problem: Coping: Goal: Level of anxiety will decrease Outcome: Progressing

## 2020-10-27 NOTE — Progress Notes (Signed)
Pt O2 desat to 65% on 50% FiO2 while RN in the room. Pt remaining dyssynchronous on the ventilator despite PRN boluses of versed and dilaudid. Pt has bilat course crackles. RN suctioned a large amount of tan secretions from patient's oral cavity. Tube feeds paused and Dr. Lake Bells notified of possible aspiration; awaiting further orders. FiO2 temporarily increased to 100% for desaturation. Agricultural consultant and RT notified by [employer]. This RN gave pt ordered PRN Nimbex push for persistent dyssynchrony. FiO2 now at 60%, O2 90%. This RN will continue to carefully monitor pt.

## 2020-10-27 NOTE — Progress Notes (Signed)
Collected and sent to Lab ordered sputum sample- RN aware.

## 2020-10-27 NOTE — Progress Notes (Addendum)
[place] Progress Note Patient Name: Jackson Figueroa DOB: [birth year] MRN: [id number]   Date of Service  10/27/2020  HPI/Events of Note  Notified that patient's tube feeding has been decreased earlier today and is now just on trickle feeds. Also SBP > 170s, HR 120s despite increasing sedation  eICU Interventions  CBG 118 Decrease Lantus to 5 units instead of 10 Ordered Labetalol 10 mg prn     Intervention Category Major Interventions: Hyperglycemia - active titration of insulin therapy;Hypertension - evaluation and management  Judd Lien 10/27/2020, 8:47 PM

## 2020-10-27 NOTE — Progress Notes (Signed)
4.5 mLs of Nimbex wasted with Azzie Almas, RN in stericycle bin.

## 2020-10-27 NOTE — Progress Notes (Signed)
Another 4.5 mLs of Nimbex wasted in stericycle with Azzie Almas, RN.

## 2020-10-28 ENCOUNTER — Inpatient Hospital Stay (HOSPITAL_COMMUNITY): Payer: BC Managed Care – PPO

## 2020-10-28 DIAGNOSIS — Z9911 Dependence on respirator [ventilator] status: Secondary | ICD-10-CM

## 2020-10-28 LAB — BLOOD GAS, ARTERIAL
Acid-base deficit: 4.8 mmol/L — ABNORMAL HIGH (ref 0.0–2.0)
Bicarbonate: 24.3 mmol/L (ref 20.0–28.0)
FIO2: 70
MECHVT: 540 mL
O2 Saturation: 86.1 %
PEEP: 10 cmH2O
Patient temperature: 98.6
RATE: 25 resp/min
pCO2 arterial: 68 mmHg (ref 32.0–48.0)
pH, Arterial: 7.179 — CL (ref 7.350–7.450)
pO2, Arterial: 58.4 mmHg — ABNORMAL LOW (ref 83.0–108.0)

## 2020-10-28 LAB — CBC WITH DIFFERENTIAL/PLATELET
Abs Immature Granulocytes: 0.25 10*3/uL — ABNORMAL HIGH (ref 0.00–0.07)
Basophils Absolute: 0 10*3/uL (ref 0.0–0.1)
Basophils Relative: 0 %
Eosinophils Absolute: 0 10*3/uL (ref 0.0–0.5)
Eosinophils Relative: 0 %
HCT: 41.7 % (ref 39.0–52.0)
Hemoglobin: 12.4 g/dL — ABNORMAL LOW (ref 13.0–17.0)
Immature Granulocytes: 3 %
Lymphocytes Relative: 3 %
Lymphs Abs: 0.3 10*3/uL — ABNORMAL LOW (ref 0.7–4.0)
MCH: 33.2 pg (ref 26.0–34.0)
MCHC: 29.7 g/dL — ABNORMAL LOW (ref 30.0–36.0)
MCV: 111.8 fL — ABNORMAL HIGH (ref 80.0–100.0)
Monocytes Absolute: 0.5 10*3/uL (ref 0.1–1.0)
Monocytes Relative: 6 %
Neutro Abs: 7.5 10*3/uL (ref 1.7–7.7)
Neutrophils Relative %: 88 %
Platelets: 231 10*3/uL (ref 150–400)
RBC: 3.73 MIL/uL — ABNORMAL LOW (ref 4.22–5.81)
RDW: 15.5 % (ref 11.5–15.5)
WBC: 8.6 10*3/uL (ref 4.0–10.5)
nRBC: 0 % (ref 0.0–0.2)

## 2020-10-28 LAB — BASIC METABOLIC PANEL
Anion gap: 7 (ref 5–15)
BUN: 77 mg/dL — ABNORMAL HIGH (ref 6–20)
CO2: 24 mmol/L (ref 22–32)
Calcium: 8.2 mg/dL — ABNORMAL LOW (ref 8.9–10.3)
Chloride: 119 mmol/L — ABNORMAL HIGH (ref 98–111)
Creatinine, Ser: 1.39 mg/dL — ABNORMAL HIGH (ref 0.61–1.24)
GFR, Estimated: 59 mL/min — ABNORMAL LOW (ref >60)
Glucose, Bld: 179 mg/dL — ABNORMAL HIGH (ref 70–99)
Potassium: 6.1 mmol/L — ABNORMAL HIGH (ref 3.5–5.1)
Sodium: 150 mmol/L — ABNORMAL HIGH (ref 135–145)

## 2020-10-28 LAB — COMPREHENSIVE METABOLIC PANEL
ALT: 55 U/L — ABNORMAL HIGH (ref 0–44)
AST: 36 U/L (ref 15–41)
Albumin: 2.3 g/dL — ABNORMAL LOW (ref 3.5–5.0)
Alkaline Phosphatase: 62 U/L (ref 38–126)
Anion gap: 9 (ref 5–15)
BUN: 77 mg/dL — ABNORMAL HIGH (ref 6–20)
CO2: 22 mmol/L (ref 22–32)
Calcium: 8.6 mg/dL — ABNORMAL LOW (ref 8.9–10.3)
Chloride: 119 mmol/L — ABNORMAL HIGH (ref 98–111)
Creatinine, Ser: 1.49 mg/dL — ABNORMAL HIGH (ref 0.61–1.24)
GFR, Estimated: 54 mL/min — ABNORMAL LOW (ref >60)
Glucose, Bld: 182 mg/dL — ABNORMAL HIGH (ref 70–99)
Potassium: 5.8 mmol/L — ABNORMAL HIGH (ref 3.5–5.1)
Sodium: 150 mmol/L — ABNORMAL HIGH (ref 135–145)
Total Bilirubin: 0.4 mg/dL (ref 0.3–1.2)
Total Protein: 6.5 g/dL (ref 6.5–8.1)

## 2020-10-28 LAB — GLUCOSE, CAPILLARY
Glucose-Capillary: 157 mg/dL — ABNORMAL HIGH (ref 70–99)
Glucose-Capillary: 104 mg/dL — ABNORMAL HIGH (ref 70–99)
Glucose-Capillary: 125 mg/dL — ABNORMAL HIGH (ref 70–99)
Glucose-Capillary: 155 mg/dL — ABNORMAL HIGH (ref 70–99)
Glucose-Capillary: 122 mg/dL — ABNORMAL HIGH (ref 70–99)
Glucose-Capillary: 112 mg/dL — ABNORMAL HIGH (ref 70–99)

## 2020-10-28 LAB — TRIGLYCERIDES: Triglycerides: 156 mg/dL — ABNORMAL HIGH (ref <150)

## 2020-10-28 LAB — POTASSIUM: Potassium: 6 mmol/L — ABNORMAL HIGH (ref 3.5–5.1)

## 2020-10-28 LAB — MRSA PCR SCREENING: MRSA by PCR: NEGATIVE

## 2020-10-28 MED ORDER — SODIUM ZIRCONIUM CYCLOSILICATE 10 G PO PACK
10.0000 g | PACK | Freq: Once | ORAL | Status: AC
  Administered 2020-10-28: 10 g via ORAL
  Filled 2020-10-28: qty 1

## 2020-10-28 MED ORDER — FREE WATER
200.0000 mL | Status: DC
  Administered 2020-10-28 – 2020-10-30 (×50): 200 mL

## 2020-10-28 MED ORDER — INSULIN GLARGINE 100 UNIT/ML ~~LOC~~ SOLN
5.0000 [IU] | Freq: Two times a day (BID) | SUBCUTANEOUS | Status: DC
  Administered 2020-10-29 – 2020-10-30 (×3): 5 [IU] via SUBCUTANEOUS
  Filled 2020-10-28 (×5): qty 0.05

## 2020-10-28 NOTE — Plan of Care (Signed)
  Problem: Clinical Measurements: Goal: Diagnostic test results will improve Outcome: Progressing   Problem: Clinical Measurements: Goal: Respiratory complications will improve Outcome: Progressing   Problem: Clinical Measurements: Goal: Cardiovascular complication will be avoided Outcome: Progressing   Problem: Nutrition: Goal: Adequate nutrition will be maintained Outcome: Progressing   Problem: Elimination: Goal: Will not experience complications related to bowel motility Outcome: Progressing   Problem: Safety: Goal: Ability to remain free from injury will improve Outcome: Progressing   Problem: Pain Managment: Goal: General experience of comfort will improve Outcome: Progressing   Problem: Skin Integrity: Goal: Risk for impaired skin integrity will decrease Outcome: Progressing

## 2020-10-28 NOTE — TOC Progression Note (Signed)
Transition of Care [facility]) - Progression Note    Patient Details  Name: Jackson Figueroa MRN: [id number] Date of Birth: 1961/11/04  [facility] CM/SW Contact  Leeroy Cha, RN Phone Number: 10/28/2020, 9:04 AM  Clinical Narrative:    Remains on the vent    Expected Discharge Plan: Home/Self Care    Expected Discharge Plan and Services Expected Discharge Plan: Home/Self Care   Discharge Planning Services: CM Consult   Living arrangements for the past 2 months: Single Family Home                                       Social Determinants of Health (SDOH) Interventions    Readmission Risk Interventions No flowsheet data found.

## 2020-10-28 NOTE — Progress Notes (Signed)
Pt proned

## 2020-10-28 NOTE — Progress Notes (Signed)
eLink Physician-Brief Progress Note Patient Name: Jackson Figueroa DOB: 05-04-1961 MRN: [id number]   Date of Service  10/28/2020  HPI/Events of Note  Notified of K 5.8 Creatinine 1.49 No changes in telemetry  eICU Interventions  Ordered a dose of Lokelma     Intervention Category Intermediate Interventions: Electrolyte abnormality - evaluation and management  Judd Lien 10/28/2020, 4:43 AM

## 2020-10-28 NOTE — Progress Notes (Signed)
NAME:  Jackson Figueroa, MRN:  [id number], DOB:  06/17/1961, LOS: 8 ADMISSION DATE:  10/20/2020, CONSULTATION DATE:  11/29 REFERRING MD:  Sabra Heck, CHIEF COMPLAINT:  ARDS COVID 41   Brief History   59 year old male with no significant past medical history and a positive COVID-19 test on 11/26 presented to [facility] with profound hypoxemia on 11/28.  Intubated immediately upon arrival found to be in ARDS.  Transferred to [facility] for ongoing ICU care in the early a.m. hours of 11/29.   Past Medical History  obesity  Significant Hospital Events   11/28 [facility] emergency department where he was promptly intubated for hypoxemia and airway protection.  In the emergency department he was started on empiric antibiotics and developed shock requiring norepinephrine infusion.  He was transferred to [facility] for ICU admission.  Therapeutics for COVID-19 initiated include dexamethasone and remdesivir. 11/29 shock state persisted. Central line placed, ARDS protocol continued, proning initiated 11/30: Pressor requirements continued, neuromuscular blockade initiated to facilitate ventilation tube feeds started, initiated proning 12/1: Stopping neuromuscular blockade, proning positioning protocol discontinued, added free water for rising creatinine and sodium 12/2: PEEP trending down, maintaining pulse oximetry.  Renal function improved. Changed to versed from prop gtt d/t rising triglycerides  12/3 Remdesivir now completed. Peep/FIO2 stable. Free water adjusted. Solumedrol changed to pred 40mg , changing fentanyl to Dilaudid, adding Oxycodone  12/4-5 continued dyssynchrony, given paralytic overnight, changed to ketamine, started baricitinib  Consults:    Procedures:  ETT 11/28 > CVL 11/29 >  Significant Diagnostic Tests:  Echo 11/29 > LVEF 50-55%, mild LVH, RV systolic function mildly reduced. Mild dilation of ascending aorta 47mm.  Micro Data:  Blood 11/29 > Urine  11/28 >  SARS COV2/Flu > negative  Antimicrobials:  CTX 11/28 > 11/29 Azithromycin 11/28 > 11/29  remdesivir 11/28 >  Prednisone 11/28 >  baricitinib 12/4 >   12/5 vanc >  12/5 cefepime >   Interim history/subjective:  Required paralytics yesterday after vomiting and aspiration episode. Increased FiO2 overnight.   Objective   Blood pressure (!) 144/83, pulse (!) 101, temperature 98.1 F (36.7 C), resp. rate (!) 25, height 6' (1.829 m), weight 127.2 kg, SpO2 (!) 88 %.    Vent Mode: PRVC FiO2 (%):  [50 %-100 %] 70 % Set Rate:  [25 bmp] 25 bmp Vt Set:  [540 mL] 540 mL PEEP:  [10 cmH20] 10 cmH20 Plateau Pressure:  [24 cmH20-30 cmH20] 30 cmH20   Intake/Output Summary (Last 24 hours) at 10/28/2020 1007 Last data filed at 10/28/2020 0317 Gross per 24 hour  Intake 1106.9 ml  Output 3100 ml  Net -1993.1 ml   Filed Weights   10/23/20 0500 10/24/20 0500 10/26/20 0433  Weight: 129.8 kg 129.8 kg 127.2 kg    Examination:  General: critically ill appearing man intubated, sedated on vent, sedation, NMB HENT: [place]/AT, eyes anicteric, oral mucosa moist PULM: bilateral rhales. Breathing synchronously with the vent. Pplat 28, DP 18. CV: tachycardic, regular rhythm GI: obese, soft, NT, ND MSK: normal muscle mass. No clubbing, cyanosis, or edema. Neuro: pinpoint pupils, under NMB  12/6 CXR images reviewed> worse RLL infiltrate, persistent bilateral infiltrates   Resolved Hospital Problem list     Assessment & Plan:  ARDS due to COVID 19 pneumonia> worse today due to aspiration episode. P:F today 82 -Continue mechanical ventilation per ARDS protocol Target TVol 4-8cc/kgIBW Target Plateau Pressure < 30cm H20 Target driving pressure <17-79 cm of water Target PaO2 55-65: titrate  PEEP/FiO2 per protocol As long as PaO2 to FiO2 ratio is less than 1:150 position in prone position for 16 hours a day> re-initiate proning today. Ventilator associated pneumonia prevention protocol -d/c  baricitinib -con't NMB & heavy sedation -con't steroids  Fever, worrisome for HCAP -resp culture pending from 12/5; checking MRSA nares PCR to determine if appropriate to deescalate vanc -con't zosyn -baricitinib discontinued  Severe ventilator dyssynchrony Acute metabolic encephalopathy -Continue clonazepam -Continue oxycodone -Continue ketamine -Con't paralytic today -Continue dilaudid and versed drip -RASS target -4 to -5 -unfortunately need to continue steroids  Hypernatremia  -con't free water via tube; may need to add D5w if not tolerating enterally. -Holding diuretics -con't to monitor renal function -strict I/Os  Hyperglycemia -Continue glargine, SSI, linagliptin -goal BG 140-180  Nutrition -Continue uptitrating tube feeding as tolerated  AKI- worse hyperkalemia -Monitor BMET and UOP -lokelma -recheck BMP this afternoon  Diarrhea -monitor  Acute anemia likely due to critical illness -transfuse for Hb<7 or hemodynamically significant bleeding  Best practice (evaluated daily)   Diet: tube feeding Pain/Anxiety/Delirium protocol (if indicated): as above VAP protocol (if indicated): yes DVT prophylaxis: lovenox GI prophylaxis: famotidine Glucose control: as above Mobility: bed rest last date of multidisciplinary goals of care discussion I attempted to call his sister today, could not leave a message; social work consulted to help find family Family and staff present n/a Summary of discussion n/a Follow up goals of care discussion due 12/6 Code Status: full Disposition: remain in ICU  Labs   CBC: Recent Labs  Lab 10/24/20 0612 10/25/20 0400 10/26/20 0430 10/27/20 0230 10/28/20 0310  WBC 10.3 10.4 10.9* 10.2 8.6  NEUTROABS  --   --   --  8.3* 7.5  HGB 11.3* 11.7* 12.4* 11.6* 12.4*  HCT 34.2* 35.8* 39.3 37.4* 41.7  MCV 100.9* 101.7* 104.8* 105.9* 111.8*  PLT 293 294 261 242 244    Basic Metabolic Panel: Recent Labs  Lab 10/22/20 0338  10/23/20 0518 10/24/20 0612 10/24/20 0612 10/25/20 0400 10/26/20 0430 10/27/20 0230 10/28/20 0310 10/28/20 0754  NA 140   < > 144  --  147* 149* 151* 150*  --   K 4.9   < > 4.4   < > 4.7 4.6 4.6 5.8* 6.0*  CL 104   < > 110  --  114* 116* 119* 119*  --   CO2 26   < > 23  --  24 23 23 22   --   GLUCOSE 170*   < > 210*  --  221* 125* 164* 182*  --   BUN 31*   < > 79*  --  79* 70* 76* 77*  --   CREATININE 1.45*   < > 1.77*  --  1.29* 1.18 1.17 1.49*  --   CALCIUM 8.1*   < > 7.8*  --  8.2* 8.4* 8.6* 8.6*  --   MG 2.6*  --   --   --   --   --   --   --   --   PHOS 5.0*  --   --   --   --   --   --   --   --    < > = values in this interval not displayed.   GFR: Estimated Creatinine Clearance: 75.4 mL/min (A) (by C-G formula based on SCr of 1.49 mg/dL (H)). Recent Labs  Lab 10/25/20 0400 10/26/20 0430 10/27/20 0230 10/28/20 0310  WBC 10.4 10.9* 10.2 8.6  Liver Function Tests: Recent Labs  Lab 10/24/20 0612 10/25/20 0400 10/26/20 0430 10/27/20 0230 10/28/20 0310  AST 32 43* 58* 36 36  ALT 28 34 51* 43 55*  ALKPHOS 49 50 59 53 62  BILITOT 0.5 0.6 0.9 0.7 0.4  PROT 5.4* 5.7* 6.1* 6.0* 6.5  ALBUMIN 2.5* 2.5* 2.6* 2.3* 2.3*   No results for input(s): LIPASE, AMYLASE in the last 168 hours. No results for input(s): AMMONIA in the last 168 hours.  ABG    Component Value Date/Time   PHART 7.383 10/25/2020 0350   PCO2ART 43.2 10/25/2020 0350   PO2ART 62.8 (L) 10/25/2020 0350   HCO3 25.2 10/25/2020 0350   ACIDBASEDEF 2.0 10/23/2020 0930   O2SAT 91.1 10/25/2020 0350     Coagulation Profile: No results for input(s): INR, PROTIME in the last 168 hours.  Cardiac Enzymes: No results for input(s): CKTOTAL, CKMB, CKMBINDEX, TROPONINI in the last 168 hours.  HbA1C: Hgb A1c MFr Bld  Date/Time Value Ref Range Status  10/22/2020 03:38 AM 5.3 4.8 - 5.6 % Final    Comment:    (NOTE)         Prediabetes: 5.7 - 6.4         Diabetes: >6.4         Glycemic control for adults  with diabetes: <7.0     CBG: Recent Labs  Lab 10/27/20 1559 10/27/20 1935 10/27/20 2323 10/28/20 0302 10/28/20 0749  GLUCAP 122* 118* 121* 157* 104*     This patient is critically ill with multiple organ system failure which requires frequent high complexity decision making, assessment, support, evaluation, and titration of therapies. This was completed through the application of advanced monitoring technologies and extensive interpretation of multiple databases. During this encounter critical care time was devoted to patient care services described in this note for 37 minutes.  Julian Hy, DO 10/28/20 12:12 PM [facility]

## 2020-10-28 NOTE — Progress Notes (Signed)
Pharmacy Antibiotic Note  Jackson Figueroa is a 59 y.o. male admitted on 10/20/2020 with COVID PNA requiring intubation. Pharmacy has been consulted for vancomycin + cefepime dosing for PNA.  Today, 10/28/20  SCr increased 1.17 > 1.49, CrCl ~75 mL/min  MRSA PCR negative  Plan:  Cefepime 2 g IV q8h  D/c Vanc with negative MRSA PCR and rising SCr Follow up renal function, culture results, and clinical course.  Height: 6' (182.9 cm) Weight: 127.2 kg (280 lb 6.8 oz) IBW/kg (Calculated) : 77.6  Temp (24hrs), Avg:98.7 F (37.1 C), Min:98.1 F (36.7 C), Max:99.9 F (37.7 C)  Recent Labs  Lab 10/24/20 0612 10/25/20 0400 10/26/20 0430 10/27/20 0230 10/28/20 0310  WBC 10.3 10.4 10.9* 10.2 8.6  CREATININE 1.77* 1.29* 1.18 1.17 1.49*    Estimated Creatinine Clearance: 75.4 mL/min (A) (by C-G formula based on SCr of 1.49 mg/dL (H)).    No Known Allergies  Antimicrobials this admission: vancomycin 12/5 >> 12/6 cefepime 12/5 >>   Dose adjustments this admission:  Microbiology results: 11/28 BCx: ngF 11/28 UCx: ngF  11/28 MRSA PCR: Negative 11/28 COVID: Positive  Thank you for allowing pharmacy to be a part of this patient's care.  Gretta Arab PharmD, BCPS Clinical Pharmacist [facility] main pharmacy [phone] 10/28/2020 11:36 AM

## 2020-10-29 LAB — BASIC METABOLIC PANEL
Anion gap: 8 (ref 5–15)
BUN: 81 mg/dL — ABNORMAL HIGH (ref 6–20)
CO2: 24 mmol/L (ref 22–32)
Calcium: 8.3 mg/dL — ABNORMAL LOW (ref 8.9–10.3)
Chloride: 114 mmol/L — ABNORMAL HIGH (ref 98–111)
Creatinine, Ser: 1.38 mg/dL — ABNORMAL HIGH (ref 0.61–1.24)
GFR, Estimated: 60 mL/min — ABNORMAL LOW (ref >60)
Glucose, Bld: 121 mg/dL — ABNORMAL HIGH (ref 70–99)
Potassium: 5.7 mmol/L — ABNORMAL HIGH (ref 3.5–5.1)
Sodium: 146 mmol/L — ABNORMAL HIGH (ref 135–145)

## 2020-10-29 LAB — BLOOD GAS, ARTERIAL
Acid-base deficit: 5 mmol/L — ABNORMAL HIGH (ref 0.0–2.0)
Bicarbonate: 23.8 mmol/L (ref 20.0–28.0)
Drawn by: 270211
FIO2: 60
MECHVT: 540 mL
O2 Saturation: 91.7 %
PEEP: 10 cmH2O
Patient temperature: 98.6
RATE: 25 resp/min
pCO2 arterial: 65.5 mmHg (ref 32.0–48.0)
pH, Arterial: 7.185 — CL (ref 7.350–7.450)
pO2, Arterial: 67.9 mmHg — ABNORMAL LOW (ref 83.0–108.0)

## 2020-10-29 LAB — GLUCOSE, CAPILLARY
Glucose-Capillary: 110 mg/dL — ABNORMAL HIGH (ref 70–99)
Glucose-Capillary: 117 mg/dL — ABNORMAL HIGH (ref 70–99)
Glucose-Capillary: 112 mg/dL — ABNORMAL HIGH (ref 70–99)
Glucose-Capillary: 145 mg/dL — ABNORMAL HIGH (ref 70–99)
Glucose-Capillary: 119 mg/dL — ABNORMAL HIGH (ref 70–99)
Glucose-Capillary: 105 mg/dL — ABNORMAL HIGH (ref 70–99)

## 2020-10-29 MED ORDER — SODIUM ZIRCONIUM CYCLOSILICATE 10 G PO PACK
10.0000 g | PACK | Freq: Once | ORAL | Status: AC
  Administered 2020-10-29: 10 g
  Filled 2020-10-29: qty 1

## 2020-10-29 MED ORDER — LEVOFLOXACIN IN D5W 750 MG/150ML IV SOLN
750.0000 mg | INTRAVENOUS | Status: DC
  Administered 2020-10-29 – 2020-11-01 (×4): 750 mg via INTRAVENOUS
  Filled 2020-10-29 (×4): qty 150

## 2020-10-29 NOTE — Progress Notes (Signed)
Pt flipped to supine position without complication.  ABG in 2 hours.

## 2020-10-29 NOTE — Progress Notes (Signed)
NAME:  Jackson Figueroa, MRN:  [id number], DOB:  04-19-1961, LOS: 9 ADMISSION DATE:  10/20/2020, CONSULTATION DATE:  11/29 REFERRING MD:  Sabra Heck, CHIEF COMPLAINT:  ARDS COVID 31   Brief History   59 year old male with no significant past medical history and a positive COVID-19 test on 11/26 presented to [facility] with profound hypoxemia on 11/28.  Intubated immediately upon arrival found to be in ARDS.  Transferred to [facility] for ongoing ICU care in the early a.m. hours of 11/29.   Past Medical History  obesity  Significant Hospital Events   11/28 [facility] emergency department where he was promptly intubated for hypoxemia and airway protection.  In the emergency department he was started on empiric antibiotics and developed shock requiring norepinephrine infusion.  He was transferred to [facility] for ICU admission.  Therapeutics for COVID-19 initiated include dexamethasone and remdesivir. 11/29 shock state persisted. Central line placed, ARDS protocol continued, proning initiated 11/30: Pressor requirements continued, neuromuscular blockade initiated to facilitate ventilation tube feeds started, initiated proning 12/1: Stopping neuromuscular blockade, proning positioning protocol discontinued, added free water for rising creatinine and sodium 12/2: PEEP trending down, maintaining pulse oximetry.  Renal function improved. Changed to versed from prop gtt d/t rising triglycerides  12/3 Remdesivir now completed. Peep/FIO2 stable. Free water adjusted. Solumedrol changed to pred 40mg , changing fentanyl to Dilaudid, adding Oxycodone  12/4-5 continued dyssynchrony, given paralytic overnight, changed to ketamine, started baricitinib  Consults:    Procedures:  ETT 11/28 > CVL 11/29 >  Significant Diagnostic Tests:  Echo 11/29 > LVEF 50-55%, mild LVH, RV systolic function mildly reduced. Mild dilation of ascending aorta 80mm.  Micro Data:  Blood 11/29 >NG Urine  11/28 > NG SARS COV2/Flu > negative 12/5 resp> few GPC  Antimicrobials:  CTX 11/28 > 11/29 Azithromycin 11/28 > 11/29  remdesivir 11/28 >  Prednisone 11/28 >  baricitinib 12/4 >   12/5 vanc >  12/5 cefepime >   Interim history/subjective:  Required paralytics yesterday after vomiting and aspiration episode. Increased FiO2 overnight.   Objective   Blood pressure (!) 155/77, pulse 93, temperature (!) 97 F (36.1 C), resp. rate (!) 25, height 6' (1.829 m), weight 127.2 kg, SpO2 96 %.    Vent Mode: PRVC FiO2 (%):  [60 %-70 %] 60 % Set Rate:  [25 bmp] 25 bmp Vt Set:  [540 mL] 540 mL PEEP:  [10 cmH20] 10 cmH20 Plateau Pressure:  [27 cmH20-30 cmH20] 27 cmH20   Intake/Output Summary (Last 24 hours) at 10/29/2020 0802 Last data filed at 10/29/2020 0600 Gross per 24 hour  Intake 1789.5 ml  Output 4425 ml  Net -2635.5 ml   Filed Weights   10/23/20 0500 10/24/20 0500 10/26/20 0433  Weight: 129.8 kg 129.8 kg 127.2 kg    Examination:  General: critically ill appearing man laying in bed intubated, sedated, under NMB HENT: NCAT, eyes anicteric, oral mucosa moist, ETT PULM: mechanical breath sounds, b/l rhales, minimal thick ETT secretions. Synchronous with MV. Pplat 27 CV: RRR GI: obese, soft, NT MSK: no c/c/e, normal muscle mass Neuro: pinpoint pupils    Resolved Hospital Problem list     Assessment & Plan:  ARDS due to COVID 19 pneumonia> worse today due to aspiration episode. P:F today 111 -con't LTVV, goal Pplat <30 and DP<15 -titrate PEEP & FiO2 per ARDS protocol -VAP prevention protocol -prone 16 h/d until P:F>150 -sedation while under NMB with goal RASS -5 -con't steroids and antibiotics  Fever,  worrisome for HAP -resp culture pending from 12/5- con't to follow -con't zosyn  Shock due to sedation, less likely sepsis -vasopressors as required to maintain MAP >65  Severe ventilator dyssynchrony Acute metabolic encephalopathy -Continue  clonazepam -Continue oxycodone -Continue ketamine -Con't vecuronium today. Prone  16 h/d. -Continue dilaudid and versed drip -RASS target -5  Hypernatremia - improving -con't free water via tube -Holding diuretics -con't to monitor renal function -strict I/Os  Hyperkalemia -lokelma -con't to monitor  Hyperglycemia -Continue glargine, SSI, linagliptin -goal BG 140-180  Nutrition -Continue uptitrating tube feeding as tolerated; unsure why decreased to trickle feeds  AKI- stable Hyperkalemia- stable -Monitor BMET and UOP -lokelma -recheck BMP this afternoon  Diarrhea -monitor; d/c docusate -con't FMS  Acute anemia likely due to critical illness -transfuse for Hb<7 or hemodynamically significant bleeding  Best practice (evaluated daily)   Diet: tube feeding Pain/Anxiety/Delirium protocol (if indicated): as above VAP protocol (if indicated): yes DVT prophylaxis: lovenox GI prophylaxis: famotidine Glucose control: as above Mobility: bed rest last date of multidisciplinary goals of care discussion: 12/7- tried to call sister- no answer or ability to leave message; social work previously consulted to help find family. L/M for significant other Alinda Sierras. Family and staff present n/a Summary of discussion n/a Follow up goals of care discussion due 12/8 Code Status: full Disposition: remain in ICU  Labs   CBC: Recent Labs  Lab 10/24/20 0612 10/25/20 0400 10/26/20 0430 10/27/20 0230 10/28/20 0310  WBC 10.3 10.4 10.9* 10.2 8.6  NEUTROABS  --   --   --  8.3* 7.5  HGB 11.3* 11.7* 12.4* 11.6* 12.4*  HCT 34.2* 35.8* 39.3 37.4* 41.7  MCV 100.9* 101.7* 104.8* 105.9* 111.8*  PLT 293 294 261 242 725    Basic Metabolic Panel: Recent Labs  Lab 10/25/20 0400 10/25/20 0400 10/26/20 0430 10/27/20 0230 10/28/20 0310 10/28/20 0754 10/28/20 1207  NA 147*  --  149* 151* 150*  --  150*  K 4.7   < > 4.6 4.6 5.8* 6.0* 6.1*  CL 114*  --  116* 119* 119*  --  119*   CO2 24  --  23 23 22   --  24  GLUCOSE 221*  --  125* 164* 182*  --  179*  BUN 79*  --  70* 76* 77*  --  77*  CREATININE 1.29*  --  1.18 1.17 1.49*  --  1.39*  CALCIUM 8.2*  --  8.4* 8.6* 8.6*  --  8.2*   < > = values in this interval not displayed.   GFR: Estimated Creatinine Clearance: 80.8 mL/min (A) (by C-G formula based on SCr of 1.39 mg/dL (H)). Recent Labs  Lab 10/25/20 0400 10/26/20 0430 10/27/20 0230 10/28/20 0310  WBC 10.4 10.9* 10.2 8.6    Liver Function Tests: Recent Labs  Lab 10/24/20 0612 10/25/20 0400 10/26/20 0430 10/27/20 0230 10/28/20 0310  AST 32 43* 58* 36 36  ALT 28 34 51* 43 55*  ALKPHOS 49 50 59 53 62  BILITOT 0.5 0.6 0.9 0.7 0.4  PROT 5.4* 5.7* 6.1* 6.0* 6.5  ALBUMIN 2.5* 2.5* 2.6* 2.3* 2.3*   No results for input(s): LIPASE, AMYLASE in the last 168 hours. No results for input(s): AMMONIA in the last 168 hours.  ABG    Component Value Date/Time   PHART 7.179 (LL) 10/28/2020 1035   PCO2ART 68.0 (HH) 10/28/2020 1035   PO2ART 58.4 (L) 10/28/2020 1035   HCO3 24.3 10/28/2020 1035   ACIDBASEDEF 4.8 (H)  10/28/2020 1035   O2SAT 86.1 10/28/2020 1035     Coagulation Profile: No results for input(s): INR, PROTIME in the last 168 hours.  Cardiac Enzymes: No results for input(s): CKTOTAL, CKMB, CKMBINDEX, TROPONINI in the last 168 hours.  HbA1C: Hgb A1c MFr Bld  Date/Time Value Ref Range Status  10/22/2020 03:38 AM 5.3 4.8 - 5.6 % Final    Comment:    (NOTE)         Prediabetes: 5.7 - 6.4         Diabetes: >6.4         Glycemic control for adults with diabetes: <7.0     CBG: Recent Labs  Lab 10/28/20 1622 10/28/20 2001 10/28/20 2322 10/29/20 0407 10/29/20 0750  GLUCAP 155* 122* 112* 110* 117*     This patient is critically ill with multiple organ system failure which requires frequent high complexity decision making, assessment, support, evaluation, and titration of therapies. This was completed through the application of  advanced monitoring technologies and extensive interpretation of multiple databases. During this encounter critical care time was devoted to patient care services described in this note for 42 minutes.  Julian Hy, DO 10/29/20 11:42 AM [facility]

## 2020-10-29 NOTE — Progress Notes (Signed)
proned at [phone]

## 2020-10-29 NOTE — Plan of Care (Signed)
Updated Jackson Figueroa significant other (girlfriend of 12 years who he lives with) regarding his status. She understands that he is very ill and may not be able to recover from this. 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 She understands my concerns with his pneumonia and other issues that can complicate his expected prolonged course. All questions were answered. RN Ander Purpura present for telephone discussion.  Julian Hy, DO 10/29/20 4:56 PM [facility]

## 2020-10-29 NOTE — Plan of Care (Signed)
Discussed in front of patient plan of care for the evening, pian management and temperature control with no evidence of learning.  Problem: Education: Goal: Knowledge of General Education information will improve Description: Including pain rating scale, medication(s)/side effects and non-pharmacologic comfort measures Outcome: Progressing   Problem: Health Behavior/Discharge Planning: Goal: Ability to manage health-related needs will improve Outcome: Progressing

## 2020-10-30 ENCOUNTER — Inpatient Hospital Stay (HOSPITAL_COMMUNITY): Payer: BC Managed Care – PPO

## 2020-10-30 ENCOUNTER — Inpatient Hospital Stay: Payer: Self-pay

## 2020-10-30 DIAGNOSIS — J181 Lobar pneumonia, unspecified organism: Secondary | ICD-10-CM

## 2020-10-30 DIAGNOSIS — R609 Edema, unspecified: Secondary | ICD-10-CM

## 2020-10-30 DIAGNOSIS — A498 Other bacterial infections of unspecified site: Secondary | ICD-10-CM

## 2020-10-30 LAB — BLOOD GAS, ARTERIAL
Acid-base deficit: 4.5 mmol/L — ABNORMAL HIGH (ref 0.0–2.0)
Bicarbonate: 24.2 mmol/L (ref 20.0–28.0)
FIO2: 60
MECHVT: 540 mL
O2 Saturation: 90.7 %
Patient temperature: 98.6
RATE: 25 resp/min
pCO2 arterial: 66.4 mmHg (ref 32.0–48.0)
pH, Arterial: 7.187 — CL (ref 7.350–7.450)
pO2, Arterial: 66.7 mmHg — ABNORMAL LOW (ref 83.0–108.0)

## 2020-10-30 LAB — BASIC METABOLIC PANEL
Anion gap: 9 (ref 5–15)
BUN: 97 mg/dL — ABNORMAL HIGH (ref 6–20)
CO2: 23 mmol/L (ref 22–32)
Calcium: 8.6 mg/dL — ABNORMAL LOW (ref 8.9–10.3)
Chloride: 111 mmol/L (ref 98–111)
Creatinine, Ser: 1.72 mg/dL — ABNORMAL HIGH (ref 0.61–1.24)
GFR, Estimated: 46 mL/min — ABNORMAL LOW (ref >60)
Glucose, Bld: 168 mg/dL — ABNORMAL HIGH (ref 70–99)
Potassium: 5.6 mmol/L — ABNORMAL HIGH (ref 3.5–5.1)
Sodium: 143 mmol/L (ref 135–145)

## 2020-10-30 LAB — GLUCOSE, CAPILLARY
Glucose-Capillary: 92 mg/dL (ref 70–99)
Glucose-Capillary: 148 mg/dL — ABNORMAL HIGH (ref 70–99)
Glucose-Capillary: 146 mg/dL — ABNORMAL HIGH (ref 70–99)
Glucose-Capillary: 124 mg/dL — ABNORMAL HIGH (ref 70–99)
Glucose-Capillary: 102 mg/dL — ABNORMAL HIGH (ref 70–99)
Glucose-Capillary: 99 mg/dL (ref 70–99)

## 2020-10-30 MED ORDER — DOCUSATE SODIUM 100 MG PO CAPS
100.0000 mg | ORAL_CAPSULE | Freq: Every day | ORAL | Status: DC
  Filled 2020-10-30: qty 1

## 2020-10-30 MED ORDER — METOCLOPRAMIDE HCL 5 MG/ML IJ SOLN
10.0000 mg | Freq: Two times a day (BID) | INTRAMUSCULAR | Status: DC
  Administered 2020-10-30 – 2020-11-06 (×14): 10 mg via INTRAVENOUS
  Filled 2020-10-30 (×14): qty 2

## 2020-10-30 MED ORDER — DEXTROSE 5 % IV SOLN: INTRAVENOUS | Status: DC

## 2020-10-30 MED ORDER — DOCUSATE SODIUM 50 MG/5ML PO LIQD
100.0000 mg | Freq: Every day | ORAL | Status: DC
  Administered 2020-10-30 – 2020-11-05 (×7): 100 mg
  Filled 2020-10-30 (×7): qty 10

## 2020-10-30 MED ORDER — SODIUM ZIRCONIUM CYCLOSILICATE 10 G PO PACK
10.0000 g | PACK | Freq: Two times a day (BID) | ORAL | Status: DC
  Administered 2020-10-30 – 2020-10-31 (×3): 10 g
  Filled 2020-10-30 (×3): qty 1

## 2020-10-30 NOTE — Progress Notes (Signed)
Supine at 0540

## 2020-10-30 NOTE — Progress Notes (Signed)
Patient proned without complication at this time.

## 2020-10-30 NOTE — Progress Notes (Signed)
Per Dr. Anell Barr recommendations we will be holding tube feeds overnight due to significant vomiting today.

## 2020-10-30 NOTE — Progress Notes (Signed)
NAME:  Jackson Figueroa, MRN:  [id number], DOB:  [birth year], LOS: 59 ADMISSION DATE:  10/20/2020, CONSULTATION DATE:  11/29 REFERRING MD:  Sabra Heck, CHIEF COMPLAINT:  ARDS COVID 84   Brief History   59 year old male with no significant past medical history and a positive COVID-19 test on 11/26 presented to [facility] with profound hypoxemia on 11/28.  Intubated immediately upon arrival found to be in ARDS.  Transferred to [facility] for ongoing ICU care in the early a.m. hours of 11/29.  Past Medical History  obesity  Significant Hospital Events   11/28 [facility] emergency department where he was promptly intubated for hypoxemia and airway protection.  In the emergency department he was started on empiric antibiotics and developed shock requiring norepinephrine infusion.  He was transferred to [facility] for ICU admission.  Therapeutics for COVID-19 initiated include dexamethasone and remdesivir. 11/29 shock state persisted. Central line placed, ARDS protocol continued, proning initiated 11/30: Pressor requirements continued, neuromuscular blockade initiated to facilitate ventilation tube feeds started, initiated proning 12/1: Stopping neuromuscular blockade, proning positioning protocol discontinued, added free water for rising creatinine and sodium 12/2: PEEP trending down, maintaining pulse oximetry.  Renal function improved. Changed to versed from prop gtt d/t rising triglycerides  12/3 Remdesivir now completed. Peep/FIO2 stable. Free water adjusted. Solumedrol changed to pred 40mg , changing fentanyl to Dilaudid, adding Oxycodone  12/4-5 continued dyssynchrony, given paralytic overnight, changed to ketamine, started baricitinib. Vomited and aspirated> restarted paralytics, escalated antibiotics 12/6: proned, under NMB 12/7: proning, NMB continued. Switched antibiotics for stenotrophomonas 12/8:  Consults:    Procedures:  ETT 11/28 > CVL 11/29 >  Significant  Diagnostic Tests:  Echo 11/29 > LVEF 50-55%, mild LVH, RV systolic function mildly reduced. Mild dilation of ascending aorta 77mm.  Micro Data:  Blood 11/29 >NG Urine 11/28 > NG SARS COV2/Flu > negative 12/5 resp> few GPC> stenotrophomonas>  Antimicrobials:  CTX 11/28 > 11/29 Azithromycin 11/28 > 11/29  remdesivir 11/28 >  Prednisone 11/28 >  baricitinib 12/4 >   12/5 vanc > 12/7 12/5 cefepime > 12/7 12/7 levofloxacin>  Interim history/subjective:  Proned overnight, supine this morning. Off pressors this morning. On paralytics but TOF 4/4.  Repeat vomiting episode this afternoon.  Objective   Blood pressure (!) 165/74, pulse 88, temperature (!) 96.4 F (35.8 C), resp. rate (!) 7, height 6' (1.829 m), weight 131 kg, SpO2 93 %.    Vent Mode: PRVC FiO2 (%):  [50 %-60 %] 60 % Set Rate:  [25 bmp] 25 bmp Vt Set:  [540 mL] 540 mL PEEP:  [10 cmH20] 10 cmH20 Plateau Pressure:  [26 cmH20-30 cmH20] 30 cmH20   Intake/Output Summary (Last 24 hours) at 10/30/2020 0932 Last data filed at 10/30/2020 7035 Gross per 24 hour  Intake 15466.58 ml  Output 3575 ml  Net 11891.58 ml   Filed Weights   10/26/20 0433 10/29/20 0500 10/30/20 0500  Weight: 127.2 kg 127.2 kg 131 kg    Examination: General: Critically ill appearing man laying in bed intubated, sedated, under NMB HENT: [place]/AT, eyes anicteric. PULM: Synchronously breathing with the vent, bilateral faint rhales. Minimal tracheal secretions. Pplat 28. CV: RRR, no murmurs GI: soft, NT MSK: LLE pitting edema, no symmetric. No clubbing or cyanosis. Neuro: Pinpoint pupils.    Resolved Hospital Problem list     Assessment & Plan:  ARDS due to COVID 19 pneumonia> worse due to aspiration episode on 12/8 and pneumonia. P:F 111 again today on 60%. Stenotrophomonas  pneumonia  -con't LTVV, goal Pplat <30 and DP<15-20; pressures acceptable. -titrate PEEP & FiO2 per ARDS protocol -VAP prevention protocol -prone 16 h/d until P:F>150--  re-prone today for P:F 111 -con't heavy sedation, ok to stop NMB today as long as he remains synchronous with MV--ST restarted this afternoon -con't steroids  -con't lovefloxan to complete 7 days  Shock due to sedation and sepsis -Vasopressors PRN to maintain MAP >65- currently off  Severe ventilator dyssynchrony Acute metabolic encephalopathy -Continue enteral clonazepam & oxycodone -Continue ketamine -Con't vecuronium today. Prone  16 h/d. -Continue dilaudid and versed drip -RASS target -5  Hypernatremia - improving -Holding free water via tube, transitioning to D5 given vomiting -Holding diuretics -con't to monitor renal function -strict I/Os  Hyperkalemia -lokelma -con't to monitor  Hyperglycemia -Continue glargine, SSI, linagliptin -goal BG 140-180  Nutrition -Holding tube feeds due to large volume aspiration -If QTC allows, will start Reglan  AKI- stable Hyperkalemia- stable -Monitor BMET and UOP daily -lokelma -recheck BMP [place]  Diarrhea-resolved -Restart low-dose docusate -Discontinue FMS  Acute anemia likely due to critical illness -transfuse for Hb<7 or hemodynamically significant bleeding in the last  LLE edema -Lower extremity ultrasound to evaluate for possible DVT-- negative study  Best practice (evaluated daily)   Diet: tube feeding- on hold due to vomiting Pain/Anxiety/Delirium protocol (if indicated): as above VAP protocol (if indicated): yes DVT prophylaxis: lovenox GI prophylaxis: famotidine Glucose control: as above Mobility: bed rest last date of multidisciplinary goals of care discussion: 12/7- updated s/o [name] Family and staff present n/a Summary of discussion n/a Follow up goals of care discussion due 12/8 Code Status: full Disposition: remain in ICU  Labs   CBC: Recent Labs  Lab 10/24/20 0612 10/25/20 0400 10/26/20 0430 10/27/20 0230 10/28/20 0310  WBC 10.3 10.4 10.9* 10.2 8.6  NEUTROABS  --   --   --  8.3* 7.5   HGB 11.3* 11.7* 12.4* 11.6* 12.4*  HCT 34.2* 35.8* 39.3 37.4* 41.7  MCV 100.9* 101.7* 104.8* 105.9* 111.8*  PLT 293 294 261 242 267    Basic Metabolic Panel: Recent Labs  Lab 10/27/20 0230 10/27/20 0230 10/28/20 0310 10/28/20 0754 10/28/20 1207 10/29/20 0800 10/30/20 0818  NA 151*  --  150*  --  150* 146* 143  K 4.6   < > 5.8* 6.0* 6.1* 5.7* 5.6*  CL 119*  --  119*  --  119* 114* 111  CO2 23  --  22  --  24 24 23   GLUCOSE 164*  --  182*  --  179* 121* 168*  BUN 76*  --  77*  --  77* 81* 97*  CREATININE 1.17  --  1.49*  --  1.39* 1.38* 1.72*  CALCIUM 8.6*  --  8.6*  --  8.2* 8.3* 8.6*   < > = values in this interval not displayed.   GFR: Estimated Creatinine Clearance: 66.4 mL/min (A) (by C-G formula based on SCr of 1.72 mg/dL (H)). Recent Labs  Lab 10/25/20 0400 10/26/20 0430 10/27/20 0230 10/28/20 0310  WBC 10.4 10.9* 10.2 8.6    Liver Function Tests: Recent Labs  Lab 10/24/20 0612 10/25/20 0400 10/26/20 0430 10/27/20 0230 10/28/20 0310  AST 32 43* 58* 36 36  ALT 28 34 51* 43 55*  ALKPHOS 49 50 59 53 62  BILITOT 0.5 0.6 0.9 0.7 0.4  PROT 5.4* 5.7* 6.1* 6.0* 6.5  ALBUMIN 2.5* 2.5* 2.6* 2.3* 2.3*   No results for input(s): LIPASE, AMYLASE in the  last 168 hours. No results for input(s): AMMONIA in the last 168 hours.  ABG    Component Value Date/Time   PHART 7.187 (LL) 10/30/2020 0818   PCO2ART 66.4 (HH) 10/30/2020 0818   PO2ART 66.7 (L) 10/30/2020 0818   HCO3 24.2 10/30/2020 0818   ACIDBASEDEF 4.5 (H) 10/30/2020 0818   O2SAT 90.7 10/30/2020 0818     Coagulation Profile: No results for input(s): INR, PROTIME in the last 168 hours.  Cardiac Enzymes: No results for input(s): CKTOTAL, CKMB, CKMBINDEX, TROPONINI in the last 168 hours.  HbA1C: Hgb A1c MFr Bld  Date/Time Value Ref Range Status  10/22/2020 03:38 AM 5.3 4.8 - 5.6 % Final    Comment:    (NOTE)         Prediabetes: 5.7 - 6.4         Diabetes: >6.4         Glycemic control for  adults with diabetes: <7.0     CBG: Recent Labs  Lab 10/29/20 1617 10/29/20 2000 10/29/20 2328 10/30/20 0421 10/30/20 0809  GLUCAP 145* 119* 105* 92 148*     This patient is critically ill with multiple organ system failure which requires frequent high complexity decision making, assessment, support, evaluation, and titration of therapies. This was completed through the application of advanced monitoring technologies and extensive interpretation of multiple databases. During this encounter critical care time was devoted to patient care services described in this note for 41 minutes.  Julian Hy, DO 10/30/20 3:22 PM [facility]

## 2020-10-30 NOTE — Progress Notes (Signed)
Left lower extremity venous duplex has been completed. Preliminary results can be found in CV Proc through chart review.   10/30/20 2:58 PM Jackson Figueroa RVT

## 2020-10-30 NOTE — TOC Progression Note (Signed)
Transition of Care [facility]) - Progression Note    Patient Details  Name: Jackson Figueroa MRN: [id number] Date of Birth: [birth year]  [facility] of [facility] CM/SW Contact  Leeroy Cha, RN Phone Number: 10/30/2020, 7:37 AM  Clinical Narrative:    [place] [facility] Events   11/28 [facility] emergency department where he was promptly intubated for hypoxemia and airway protection. In the emergency department he was started on empiric antibiotics and developed shock requiring norepinephrine infusion. He was transferred to [facility] for ICU admission. Therapeutics for COVID-19 initiated include dexamethasone and remdesivir. 11/29 shock state persisted. Central line placed, ARDS protocol continued, proning initiated 11/30: Pressor requirements continued, neuromuscular blockade initiated to facilitate ventilation tube feeds started, initiated proning 12/1: Stopping neuromuscular blockade, proning positioning protocol discontinued, added free water for rising creatinine and sodium 12/2: PEEP trending down, maintaining pulse oximetry. Renal function improved. Changed to versed from prop gtt d/t rising triglycerides  12/3 Remdesivir now completed. Peep/FIO2 stable. Free water adjusted. Solumedrol changed to pred 40mg , changing fentanyl to Dilaudid, adding Oxycodone 12/4-5 continued dyssynchrony, given paralytic overnight, changed to ketamine, started baricitinib  PLan: having eol conversations with family per md note-family wants full code Following for progression.   Expected Discharge Plan: Home/Self Care    Expected Discharge Plan and Services Expected Discharge Plan: Home/Self Care   Discharge Planning Services: CM Consult   Living arrangements for the past 2 months: Single Family Home                                       Social Determinants of Health (SDOH) Interventions    Readmission Risk Interventions No flowsheet data found.

## 2020-10-31 ENCOUNTER — Inpatient Hospital Stay (HOSPITAL_COMMUNITY): Payer: BC Managed Care – PPO

## 2020-10-31 DIAGNOSIS — N179 Acute kidney failure, unspecified: Secondary | ICD-10-CM

## 2020-10-31 DIAGNOSIS — R739 Hyperglycemia, unspecified: Secondary | ICD-10-CM

## 2020-10-31 LAB — BLOOD GAS, ARTERIAL
Acid-base deficit: 3.8 mmol/L — ABNORMAL HIGH (ref 0.0–2.0)
Bicarbonate: 24.1 mmol/L (ref 20.0–28.0)
Drawn by: 441261
FIO2: 50
MECHVT: 540 mL
O2 Saturation: 92.3 %
PEEP: 10 cmH2O
Patient temperature: 36.9
RATE: 25 resp/min
pCO2 arterial: 60.6 mmHg — ABNORMAL HIGH (ref 32.0–48.0)
pH, Arterial: 7.223 — ABNORMAL LOW (ref 7.350–7.450)
pO2, Arterial: 69.2 mmHg — ABNORMAL LOW (ref 83.0–108.0)

## 2020-10-31 LAB — BASIC METABOLIC PANEL
Anion gap: 7 (ref 5–15)
BUN: 93 mg/dL — ABNORMAL HIGH (ref 6–20)
CO2: 23 mmol/L (ref 22–32)
Calcium: 8.9 mg/dL (ref 8.9–10.3)
Chloride: 113 mmol/L — ABNORMAL HIGH (ref 98–111)
Creatinine, Ser: 2.12 mg/dL — ABNORMAL HIGH (ref 0.61–1.24)
GFR, Estimated: 36 mL/min — ABNORMAL LOW (ref >60)
Glucose, Bld: 126 mg/dL — ABNORMAL HIGH (ref 70–99)
Potassium: 5.1 mmol/L (ref 3.5–5.1)
Sodium: 143 mmol/L (ref 135–145)

## 2020-10-31 LAB — CULTURE, RESPIRATORY W GRAM STAIN: Gram Stain: NONE SEEN

## 2020-10-31 LAB — GLUCOSE, CAPILLARY
Glucose-Capillary: 115 mg/dL — ABNORMAL HIGH (ref 70–99)
Glucose-Capillary: 125 mg/dL — ABNORMAL HIGH (ref 70–99)
Glucose-Capillary: 115 mg/dL — ABNORMAL HIGH (ref 70–99)
Glucose-Capillary: 128 mg/dL — ABNORMAL HIGH (ref 70–99)
Glucose-Capillary: 136 mg/dL — ABNORMAL HIGH (ref 70–99)

## 2020-10-31 LAB — CREATININE, URINE, RANDOM: Creatinine, Urine: 45.86 mg/dL

## 2020-10-31 LAB — SODIUM, URINE, RANDOM: Sodium, Ur: 19 mmol/L

## 2020-10-31 MED ORDER — THIAMINE HCL 100 MG PO TABS
100.0000 mg | ORAL_TABLET | Freq: Every day | ORAL | Status: DC
  Administered 2020-10-31 – 2020-11-14 (×15): 100 mg
  Filled 2020-10-31 (×15): qty 1

## 2020-10-31 MED ORDER — SODIUM ZIRCONIUM CYCLOSILICATE 10 G PO PACK: 10.0000 g | PACK | Freq: Every day | ORAL | Status: DC

## 2020-10-31 MED ORDER — SODIUM CHLORIDE 0.9% FLUSH
10.0000 mL | Freq: Two times a day (BID) | INTRAVENOUS | Status: DC
  Administered 2020-10-31: 15:00:00 10 mL
  Administered 2020-11-01: 21:00:00 20 mL
  Administered 2020-11-01: 40 mL
  Administered 2020-11-02 (×2): 10 mL
  Administered 2020-11-04: 23:00:00 20 mL
  Administered 2020-11-04: 09:00:00 10 mL
  Administered 2020-11-05: 21:00:00 30 mL
  Administered 2020-11-05: 09:00:00 10 mL
  Administered 2020-11-06: 23:00:00 20 mL
  Administered 2020-11-06 – 2020-11-08 (×4): 10 mL
  Administered 2020-11-09: 23:00:00 20 mL
  Administered 2020-11-10 (×2): 10 mL
  Administered 2020-11-11: 20:00:00 20 mL
  Administered 2020-11-11 – 2020-11-14 (×6): 10 mL
  Administered 2020-11-14: 21:00:00 40 mL
  Administered 2020-11-15 (×2): 10 mL
  Administered 2020-11-16: 10:00:00 20 mL
  Administered 2020-11-17 – 2020-11-20 (×5): 10 mL
  Administered 2020-11-20: 23:00:00 20 mL
  Administered 2020-11-21 – 2020-12-08 (×33): 10 mL
  Administered 2020-12-08: 3 mL
  Administered 2020-12-09 – 2020-12-18 (×19): 10 mL

## 2020-10-31 MED ORDER — INSULIN GLARGINE 100 UNIT/ML ~~LOC~~ SOLN
5.0000 [IU] | Freq: Every day | SUBCUTANEOUS | Status: DC
  Administered 2020-11-01 – 2020-11-09 (×8): 5 [IU] via SUBCUTANEOUS
  Filled 2020-10-31 (×9): qty 0.05

## 2020-10-31 MED ORDER — FAMOTIDINE 40 MG/5ML PO SUSR
20.0000 mg | Freq: Two times a day (BID) | ORAL | Status: DC
  Administered 2020-11-01 – 2020-11-05 (×10): 20 mg
  Filled 2020-10-31 (×11): qty 2.5

## 2020-10-31 MED ORDER — SODIUM CHLORIDE 0.9% FLUSH
10.0000 mL | INTRAVENOUS | Status: DC | PRN
  Administered 2020-11-12: 23:00:00 3 mL
  Administered 2020-11-12: 21:00:00 20 mL
  Administered 2020-11-13: 10 mL
  Administered 2020-11-13: 05:00:00 3 mL
  Administered 2020-11-14 (×3): 10 mL
  Administered 2020-11-15: 06:00:00 20 mL
  Administered 2020-11-15: 05:00:00 10 mL

## 2020-10-31 MED ORDER — FAMOTIDINE 40 MG/5ML PO SUSR
20.0000 mg | Freq: Two times a day (BID) | ORAL | Status: DC
  Filled 2020-10-31: qty 2.5

## 2020-10-31 MED ORDER — VECURONIUM BROMIDE 10 MG IV SOLR
0.1000 mg/kg | INTRAVENOUS | Status: DC | PRN
  Administered 2020-11-02 – 2020-11-03 (×6): 13 mg via INTRAVENOUS
  Filled 2020-10-31 (×5): qty 20

## 2020-10-31 NOTE — Progress Notes (Signed)
Nutrition Follow-up  DOCUMENTATION CODES:   Obesity unspecified  INTERVENTION:  - re-start trickle rate TF today if feasible. - maintain current TF order of Pivot 1.5 to goal rate of 55 ml/hr with 90 ml Prosource TF QID and 200 ml free water every hour. - this regimen will provide 2300 kcal, 212 grams protein, and 5802 ml free water.   - will not change TF order at this time until tolerance of TF re-start is confirmed - updated goal regimen: Pivot 1.5 @ 65 ml/hr with 90 ml Prosource TF TID to provide 2580 kcal (97% kcal need), 212 grams protein, and 1184 ml free water.    NUTRITION DIAGNOSIS:   Increased nutrient needs related to acute illness,catabolic illness ([id number] infection) as evidenced by estimated needs -ongoing  GOAL:   Provide needs based on ASPEN/SCCM guidelines -unmet/unable to meet   MONITOR:   Vent status,TF tolerance,Labs,Weight trends  ASSESSMENT:   59 year old male with no significant medical history. He was found to be COVID-19 positive on 11/26. He presented to [facility] with profound hypoxemia on 11/28 and was emergently intubated in the ED; found to be in ARDS and transferred to [facility].  Significant Events: 11/28 - admission; emergent intubation; OGT placed; transfer from [facility] to [facility] 11/29- trickle TF initiation; proned 11/30- initial RD assessment; proned 12/1- begin TF rate advancement; proning protocol stopped 12/3- TF held due to copious thick, tan secretions; re-started and advancing TF later in the day 12/5- TF held due to copious thick, tan secretions; re-started trickle TF later in the day; proned 12/6- proned; advancing TF rate 12/7- proned 12/8- TF held d/t large volume aspiration; proned; reglan started    Patient discussed in rounds this AM. Able to discuss with CCM about nutrition support. Plan currently for OGT to remain in place to check residuals and post-pyloric small bore NGT to be placed for TF infusion.   Patient  remains intubated and OGT remains in place. Abdominal xray today indicates no evidence of ileus or obstruction, indicates that tip of OGT is gastric.  Weight has remained fairly stable since 12/1. Non-pitting edema to all extremities documented in the flow sheet.   Plan to re-prone patient today (x16 hours) and re-start trickle rate TF.    Patient is currently intubated on ventilator support MV: 13.3 L/min Temp (24hrs), Avg:97.7 F (36.5 C), Min:96.44 F (35.8 C), Max:99.9 F (37.7 C) Propofol: none  Labs reviewed; CBGs: 115, 125, 115 mg/dl, Cl: 113 mmol/l, BUN: 93 mg/dl, creatinine: 2.12 mg/dl, GFR: 36 ml/min. Medications reviewed; 100 mg colace/day, 20 mg pepcid per OGT BID, sliding scale novolog, 5 units lantus/day, 5 mg tradjenta/day, 10 mg IV reglan BID, 15 ml multivitamin/day, 17 g miralax/day, 40 mg prednisone/day, 10 g lokelma/day. IVF; D5 @ 100 ml/hr (408 kcal).  Drips; versed @ 7.5 mg/hr, vec @ 0.4 mcg/kg/min.   Diet Order:   Diet Order            Diet NPO time specified  Diet effective now                 EDUCATION NEEDS:   No education needs have been identified at this time  Skin:  Skin Assessment: Skin Integrity Issues: Skin Integrity Issues:: DTI DTI: sacrum (new documentation 12/7)  Last BM:  12/7 (25 ml via flexiseal which has since been removed)  Height:   Ht Readings from Last 1 Encounters:  10/29/20 6' (1.829 m)    Weight:   Wt Readings from  Last 1 Encounters:  10/31/20 130 kg     Estimated Nutritional Needs:  Kcal:  2660 kcal (28 kcal/kg adjBW) Protein:  205-220 grams Fluid:  >/= 4 L/day     Jarome Matin, MS, RD, LDN, CNSC Inpatient Clinical Dietitian RD pager # available in [place]  After hours/weekend pager # available in [facility]

## 2020-10-31 NOTE — Progress Notes (Signed)
Patient proned without complication at 0172.

## 2020-10-31 NOTE — Plan of Care (Signed)
S/o [name] updated via phone. She will be bringing paperwork for his job to be completed.  Julian Hy, DO 10/31/20 5:38 PM [facility]

## 2020-10-31 NOTE — Progress Notes (Signed)
Peripherally Inserted Central Catheter Placement  The IV Nurse has discussed with the patient and/or persons authorized to consent for the patient, the purpose of this procedure and the potential benefits and risks involved with this procedure.  The benefits include less needle sticks, lab draws from the catheter, and the patient may be discharged home with the catheter. Risks include, but not limited to, infection, bleeding, blood clot (thrombus formation), and puncture of an artery; nerve damage and irregular heartbeat and possibility to perform a PICC exchange if needed/ordered by physician.  Alternatives to this procedure were also discussed.  Bard Power PICC patient education guide, fact sheet on infection prevention and patient information card has been provided to patient /or left at bedside.    PICC Placement Documentation  PICC Double Lumen 73/57/89 PICC Right Basilic 44 cm 1 cm (Active)  Exposed Catheter (cm) 1 cm 10/31/20 1447  Site Assessment Clean;Dry;Intact 10/31/20 1447  Lumen #1 Status Blood return noted;Saline locked;Flushed 10/31/20 1447  Lumen #2 Status Blood return noted;Flushed;Saline locked 10/31/20 1447  Dressing Type Transparent;Securing device 10/31/20 1447  Dressing Status Clean;Dry;Intact 10/31/20 1447  Antimicrobial disc in place? Yes 10/31/20 1447  Safety Lock Not Applicable 78/47/84 1282  Dressing Change Due 11/07/20 10/31/20 1447       Frances Maywood 10/31/2020, 2:49 PM

## 2020-10-31 NOTE — Progress Notes (Signed)
NAME:  Jackson Figueroa, MRN:  [id number], DOB:  February 18, 1961, LOS: 30 ADMISSION DATE:  10/20/2020, CONSULTATION DATE:  11/29 REFERRING MD:  Sabra Heck, CHIEF COMPLAINT:  ARDS COVID 51   Brief History   59 year old male with no significant past medical history and a positive COVID-19 test on 11/26 presented to [facility] with profound hypoxemia on 11/28.  Intubated immediately upon arrival found to be in ARDS.  Transferred to [facility] for ongoing ICU care in the early a.m. hours of 11/29.  Past Medical History  obesity  Significant Hospital Events   11/28 [facility] emergency department where he was promptly intubated for hypoxemia and airway protection.  In the emergency department he was started on empiric antibiotics and developed shock requiring norepinephrine infusion.  He was transferred to [facility] for ICU admission.  Therapeutics for COVID-19 initiated include dexamethasone and remdesivir. 11/29 shock state persisted. Central line placed, ARDS protocol continued, proning initiated 11/30: Pressor requirements continued, neuromuscular blockade initiated to facilitate ventilation tube feeds started, initiated proning 12/1: Stopping neuromuscular blockade, proning positioning protocol discontinued, added free water for rising creatinine and sodium 12/2: PEEP trending down, maintaining pulse oximetry.  Renal function improved. Changed to versed from prop gtt d/t rising triglycerides  12/3 Remdesivir now completed. Peep/FIO2 stable. Free water adjusted. Solumedrol changed to pred 40mg , changing fentanyl to Dilaudid, adding Oxycodone  12/4-5 continued dyssynchrony, given paralytic overnight, changed to ketamine, started baricitinib. Vomited and aspirated> restarted paralytics, escalated antibiotics 12/6: proned, under NMB 12/7: proning, NMB continued. Switched antibiotics for stenotrophomonas 12/8:  Consults:    Procedures:  ETT 11/28 > CVL 11/29 >  Significant  Diagnostic Tests:  Echo 11/29 > LVEF 50-55%, mild LVH, RV systolic function mildly reduced. Mild dilation of ascending aorta 40mm.  Micro Data:  Blood 11/29 >NG Urine 11/28 > NG SARS COV2/Flu > negative 12/5 resp> few GPC> stenotrophomonas>  Antimicrobials:  CTX 11/28 > 11/29 Azithromycin 11/28 > 11/29  remdesivir 11/28 >  Prednisone 11/28 >  baricitinib 12/4 >   12/5 vanc > 12/7 12/5 cefepime > 12/7 12/7 levofloxacin>  Interim history/subjective:  Proned overnight, supine this morning.  Objective   Blood pressure (!) 164/77, pulse 92, temperature (!) 96.8 F (36 C), resp. rate (!) 25, height 6' (1.829 m), weight 130 kg, SpO2 97 %.    Vent Mode: PRVC FiO2 (%):  [50 %] 50 % Set Rate:  [25 bmp] 25 bmp Vt Set:  [450 mL-540 mL] 540 mL PEEP:  [10 cmH20] 10 cmH20 Plateau Pressure:  [32 cmH20] 32 cmH20   Intake/Output Summary (Last 24 hours) at 10/31/2020 0923 Last data filed at 10/31/2020 0700 Gross per 24 hour  Intake 4146.39 ml  Output 4275 ml  Net -128.61 ml   Filed Weights   10/29/20 0500 10/30/20 0500 10/31/20 0500  Weight: 127.2 kg 131 kg 130 kg    Examination: General: critically ill appearing man in bed, intubated, sedated, under NMB. HENT: [place]/AT. Oral mucosa moist. Small injury from teeth on lower tongue.  Eyes: periorbital edema from prone positioning, eyes anicteric PULM: synchronous with MV. Pplat 25, DP 15. Faint rhales bilaterally, no secretions from ETT. CV: RRR, no murmurs GI: soft, NT, ND MSK: LLE edema, no cyanosis Derm: pallor, no rashes. DTI to sacrum without skin breaks. Lip injury on lower lip. Neuro: RASS -5, pinpoint pupils    Resolved Hospital Problem list     Assessment & Plan:  ARDS due to COVID 19 pneumonia> worse due  to aspiration episode on 12/5 and pneumonia. Today on 50%. No respiratory decompensation since vomiting episode yesterday. Stenotrophomonas pneumonia  -Continue low tidal volume ventilation, 4 to 8 cc/kg ideal body  weight with goal plateau's and thirty driving pressures in fifteen. Permissive hypercapnia okay. Goal PEEP a lot less than thirty and driving pressure less than 15-20. Meeting goals. -Titrate PEEP and FiO2 per ARDS protocol  -VAP prevention protocol -prone 16 h/d until P:F>150-- repeat ABG this morning 1-2 hrs post supination -con't heavy sedation -con't steroids  -con't lovefloxan to complete 7 days -CXR today  Shock due to sedation and sepsis- resolved -Vasopressors PRN to maintain MAP >65- currently off  Severe ventilator dyssynchrony; has caused issues leading to vomiting episodes Acute metabolic encephalopathy -Continue enteral clonazepam & oxycodone -No longer requiring ketamine -Con't vecuronium today; can trial a break again later if stable. Prone 16 h/d. -Continue dilaudid and versed drip -RASS target -5  Vomiting, suspect ileus -reglan -KUB -Continue holding tube feeds for now; if no severe gaseous distention can start trickle tube feeds later.  Hypernatremia - improving -Holding free water via tube, transitioning to D5 given vomiting -Holding diuretics -con't to monitor renal function -strict I/Os  Hyperglycemia -Continue glargine- decreased to once daily, SSI, linagliptin -goal BG 140-180  Nutrition -Holding tube feeds due to large volume aspiration; hopefully can restart later -con't reglan  AKI-worsening. Not oliguric. Hyperkalemia-resolved -Monitor BMET and UOP daily -lokelma daily for maintenance until downtrending -Maintain euvolemia  Diarrhea-resolved -Restart low-dose docusate -removed FMS previously  Acute anemia likely due to critical illness -transfuse for Hb<7 or hemodynamically significant bleeding -con't to monitor  LLE edema- no DVT on 12/8 [place] -con't to monitor  Best practice (evaluated daily)   Diet: tube feeding- on hold due to vomiting Pain/Anxiety/Delirium protocol (if indicated): as above VAP protocol (if indicated): yes DVT  prophylaxis: lovenox GI prophylaxis: famotidine Glucose control: as above Mobility: bed rest last date of multidisciplinary goals of care discussion: 12/7- updated s/o [name] Family and staff present n/a Summary of discussion n/a Follow up goals of care discussion due 12/8 Code Status: full Disposition: remain in ICU  Labs   CBC: Recent Labs  Lab 10/25/20 0400 10/26/20 0430 10/27/20 0230 10/28/20 0310  WBC 10.4 10.9* 10.2 8.6  NEUTROABS  --   --  8.3* 7.5  HGB 11.7* 12.4* 11.6* 12.4*  HCT 35.8* 39.3 37.4* 41.7  MCV 101.7* 104.8* 105.9* 111.8*  PLT 294 261 242 009    Basic Metabolic Panel: Recent Labs  Lab 10/28/20 0310 10/28/20 0754 10/28/20 1207 10/29/20 0800 10/30/20 0818 10/31/20 0607  NA 150*  --  150* 146* 143 143  K 5.8* 6.0* 6.1* 5.7* 5.6* 5.1  CL 119*  --  119* 114* 111 113*  CO2 22  --  24 24 23 23   GLUCOSE 182*  --  179* 121* 168* 126*  BUN 77*  --  77* 81* 97* 93*  CREATININE 1.49*  --  1.39* 1.38* 1.72* 2.12*  CALCIUM 8.6*  --  8.2* 8.3* 8.6* 8.9   GFR: Estimated Creatinine Clearance: 53.6 mL/min (A) (by C-G formula based on SCr of 2.12 mg/dL (H)). Recent Labs  Lab 10/25/20 0400 10/26/20 0430 10/27/20 0230 10/28/20 0310  WBC 10.4 10.9* 10.2 8.6    Liver Function Tests: Recent Labs  Lab 10/25/20 0400 10/26/20 0430 10/27/20 0230 10/28/20 0310  AST 43* 58* 36 36  ALT 34 51* 43 55*  ALKPHOS 50 59 53 62  BILITOT 0.6 0.9 0.7  0.4  PROT 5.7* 6.1* 6.0* 6.5  ALBUMIN 2.5* 2.6* 2.3* 2.3*   No results for input(s): LIPASE, AMYLASE in the last 168 hours. No results for input(s): AMMONIA in the last 168 hours.  ABG    Component Value Date/Time   PHART 7.187 (LL) 10/30/2020 0818   PCO2ART 66.4 (HH) 10/30/2020 0818   PO2ART 66.7 (L) 10/30/2020 0818   HCO3 24.2 10/30/2020 0818   ACIDBASEDEF 4.5 (H) 10/30/2020 0818   O2SAT 90.7 10/30/2020 0818     Coagulation Profile: No results for input(s): INR, PROTIME in the last 168 hours.  Cardiac  Enzymes: No results for input(s): CKTOTAL, CKMB, CKMBINDEX, TROPONINI in the last 168 hours.  HbA1C: Hgb A1c MFr Bld  Date/Time Value Ref Range Status  10/22/2020 03:38 AM 5.3 4.8 - 5.6 % Final    Comment:    (NOTE)         Prediabetes: 5.7 - 6.4         Diabetes: >6.4         Glycemic control for adults with diabetes: <7.0     CBG: Recent Labs  Lab 10/30/20 1146 10/30/20 1615 10/30/20 2017 10/30/20 2325 10/31/20 0438  GLUCAP 146* 124* 102* 99 115*     This patient is critically ill with multiple organ system failure which requires frequent high complexity decision making, assessment, support, evaluation, and titration of therapies. This was completed through the application of advanced monitoring technologies and extensive interpretation of multiple databases. During this encounter critical care time was devoted to patient care services described in this note for 43 minutes.  Julian Hy, DO 10/31/20 10:07 AM [facility]

## 2020-11-01 ENCOUNTER — Inpatient Hospital Stay (HOSPITAL_COMMUNITY): Payer: BC Managed Care – PPO

## 2020-11-01 DIAGNOSIS — E875 Hyperkalemia: Secondary | ICD-10-CM

## 2020-11-01 LAB — BLOOD GAS, ARTERIAL
Acid-base deficit: 2.8 mmol/L — ABNORMAL HIGH (ref 0.0–2.0)
Bicarbonate: 23.6 mmol/L (ref 20.0–28.0)
O2 Saturation: 95.2 %
Patient temperature: 98.6
pCO2 arterial: 50.3 mmHg — ABNORMAL HIGH (ref 32.0–48.0)
pH, Arterial: 7.293 — ABNORMAL LOW (ref 7.350–7.450)
pO2, Arterial: 84.9 mmHg (ref 83.0–108.0)

## 2020-11-01 LAB — BASIC METABOLIC PANEL
Anion gap: 9 (ref 5–15)
BUN: 91 mg/dL — ABNORMAL HIGH (ref 6–20)
CO2: 23 mmol/L (ref 22–32)
Calcium: 9 mg/dL (ref 8.9–10.3)
Chloride: 112 mmol/L — ABNORMAL HIGH (ref 98–111)
Creatinine, Ser: 1.81 mg/dL — ABNORMAL HIGH (ref 0.61–1.24)
GFR, Estimated: 43 mL/min — ABNORMAL LOW (ref >60)
Glucose, Bld: 119 mg/dL — ABNORMAL HIGH (ref 70–99)
Potassium: 5.2 mmol/L — ABNORMAL HIGH (ref 3.5–5.1)
Sodium: 144 mmol/L (ref 135–145)

## 2020-11-01 LAB — CBC
HCT: 37.9 % — ABNORMAL LOW (ref 39.0–52.0)
Hemoglobin: 11.5 g/dL — ABNORMAL LOW (ref 13.0–17.0)
MCH: 32.4 pg (ref 26.0–34.0)
MCHC: 30.3 g/dL (ref 30.0–36.0)
MCV: 106.8 fL — ABNORMAL HIGH (ref 80.0–100.0)
Platelets: 199 10*3/uL (ref 150–400)
RBC: 3.55 MIL/uL — ABNORMAL LOW (ref 4.22–5.81)
RDW: 14.4 % (ref 11.5–15.5)
WBC: 12.3 10*3/uL — ABNORMAL HIGH (ref 4.0–10.5)
nRBC: 0 % (ref 0.0–0.2)

## 2020-11-01 LAB — GLUCOSE, CAPILLARY
Glucose-Capillary: 100 mg/dL — ABNORMAL HIGH (ref 70–99)
Glucose-Capillary: 105 mg/dL — ABNORMAL HIGH (ref 70–99)
Glucose-Capillary: 110 mg/dL — ABNORMAL HIGH (ref 70–99)
Glucose-Capillary: 96 mg/dL (ref 70–99)
Glucose-Capillary: 98 mg/dL (ref 70–99)
Glucose-Capillary: 98 mg/dL (ref 70–99)
Glucose-Capillary: 98 mg/dL (ref 70–99)

## 2020-11-01 MED ORDER — SODIUM ZIRCONIUM CYCLOSILICATE 10 G PO PACK
10.0000 g | PACK | Freq: Two times a day (BID) | ORAL | Status: DC
  Administered 2020-11-01 – 2020-11-07 (×13): 10 g
  Filled 2020-11-01 (×14): qty 1

## 2020-11-01 NOTE — Progress Notes (Signed)
NAME:  Jackson Figueroa, MRN:  [id number], DOB:  05-15-1961, LOS: 12 ADMISSION DATE:  10/20/2020, CONSULTATION DATE:  11/29 REFERRING MD:  Sabra Heck, CHIEF COMPLAINT:  ARDS COVID 57   Brief History   59 year old male with no significant past medical history and a positive COVID-19 test on 11/26 presented to [facility] with profound hypoxemia on 11/28.  Intubated immediately upon arrival found to be in ARDS.  Transferred to [facility] for ongoing ICU care in the early a.m. hours of 11/29.  Past Medical History  obesity  Significant Hospital Events   11/28 [facility] emergency department where he was promptly intubated for hypoxemia and airway protection.  In the emergency department he was started on empiric antibiotics and developed shock requiring norepinephrine infusion.  He was transferred to [facility] for ICU admission.  Therapeutics for COVID-19 initiated include dexamethasone and remdesivir. 11/29 shock state persisted. Central line placed, ARDS protocol continued, proning initiated 11/30: Pressor requirements continued, neuromuscular blockade initiated to facilitate ventilation tube feeds started, initiated proning 12/1: Stopping neuromuscular blockade, proning positioning protocol discontinued, added free water for rising creatinine and sodium 12/2: PEEP trending down, maintaining pulse oximetry.  Renal function improved. Changed to versed from prop gtt d/t rising triglycerides  12/3 Remdesivir now completed. Peep/FIO2 stable. Free water adjusted. Solumedrol changed to pred 40mg , changing fentanyl to Dilaudid, adding Oxycodone  12/4-5 continued dyssynchrony, given paralytic overnight, changed to ketamine, started baricitinib. Vomited and aspirated> restarted paralytics, escalated antibiotics 12/6: proned, under NMB 12/7: proning, NMB continued. Switched antibiotics for stenotrophomonas 12/8:  Consults:    Procedures:  ETT 11/28 > CVL 11/29 >  Significant  Diagnostic Tests:  Echo 11/29 > LVEF 50-55%, mild LVH, RV systolic function mildly reduced. Mild dilation of ascending aorta 17mm.  Micro Data:  Blood 11/29 >NG Urine 11/28 > NG SARS COV2/Flu > negative 12/5 resp> few GPC> stenotrophomonas> sensitive to Bactrim & levofloxacin  Antimicrobials:  CTX 11/28 > 11/29 Azithromycin 11/28 > 11/29  remdesivir 11/28 >  Prednisone 11/28 >  baricitinib 12/4 >   12/5 vanc > 12/7 12/5 cefepime > 12/7 12/7 levofloxacin>  Interim history/subjective:  Proned overnight.  Off paralytics, mild dyssynchrony. Able to be managed with PRN vecuronium.  Objective   Blood pressure (!) 181/107, pulse (!) 105, temperature 98.96 F (37.2 C), resp. rate (!) 22, height 6' (1.829 m), weight 129.3 kg, SpO2 98 %.    Vent Mode: PRVC FiO2 (%):  [50 %] 50 % Set Rate:  [25 bmp] 25 bmp Vt Set:  [540 mL] 540 mL PEEP:  [10 cmH20] 10 cmH20 Plateau Pressure:  [22 cmH20-27 cmH20] 22 cmH20   Intake/Output Summary (Last 24 hours) at 11/01/2020 0920 Last data filed at 11/01/2020 0700 Gross per 24 hour  Intake 2626.55 ml  Output 4750 ml  Net -2123.45 ml   Filed Weights   10/30/20 0500 10/31/20 0500 11/01/20 0438  Weight: 131 kg 130 kg 129.3 kg    Examination: General: critically ill appearing man examined in prone position HEENT: [place]/AT, eyes anicteric. Much improved periorbital and facial edema today.  PULM: mild vent dyssynchrony, Pplat 25, DP 15, peak pressures around 30 despite occasional dyssynchrony. CTAB. CV:  NSR on tele. GI: obese, soft, unable to auscultate bowel sounds MSK: L>R Le edema, normal muscle mass Derm: pallor, skin dry. No echcymoses.  Neuro: RASS -5, breathing above the vent. Pinpoint pupil on the right. Left not examined due to positioning.   Resolved Hospital Problem list  Assessment & Plan:  ARDS due to COVID 19 pneumonia> worse due to aspiration episode on 12/5 and pneumonia. Today on 50%. No respiratory decompensation since  vomiting episode on 12/8 thankfully. Stenotrophomonas pneumonia  -Con't LTVV, 4-8cc/kg IBW with goal Pplat <30 and DP <15- meeting both goals. No deterioration of respiratory status despite discontinuation of vecuronium infusion. -Titrate PEEP and FiO2 per ARDS protocol  -VAP prevention protocol -7 days of levofloxacin for Stenotrophomonas -prone 16 h/d until P:F>150; repeat ABG this morning post-supination -con't heavy sedation for now -con't steroids - prednisone 40mg  daily  Shock due to sedation and sepsis- resolved -Vasopressors PRN to maintain MAP >65- remain off.  Severe ventilator dyssynchrony-- ok for now on heavy sedation; has caused issues leading to vomiting episodes previously Acute metabolic encephalopathy due to sedation -Continue enteral clonazepam & oxycodone -No longer requiring ketamine -Con't PRN vecuronium for significant dyssynchrony -Continue dilaudid and versed drips -RASS target -4 to -5  Vomiting, suspect ileus -con't reglan -slowly advance TF and monitor residuals  Hypernatremia - improving -Holding free water via tube, transitioning to D5 given vomiting -Holding diuretics -con't to monitor renal function -strict I/Os  Hyperglycemia -Continue glargine once daily SSI, linagliptin -goal BG 140-180  Nutrition -advance TF, monitor residuals -con't reglan  AKI-stable. Not oliguric. Hyperkalemia- persistent -Monitor BMET and UOP daily -lokelma increaed to twice daily for maintenance until downtrending -goal of euvolemia; auto-diuresing  Diarrhea-resolved -Restart low-dose docusate -removed FMS previously  Acute anemia likely due to critical illness- stable -transfuse for Hb<7 or hemodynamically significant bleeding -con't to monitor  LLE edema- no DVT on 12/8 [place] -con't to monitor  Best practice (evaluated daily)   Diet: tube feeding- on hold due to vomiting Pain/Anxiety/Delirium protocol (if indicated): as above VAP protocol (if  indicated): yes DVT prophylaxis: lovenox GI prophylaxis: famotidine Glucose control: as above Mobility: bed rest last date of multidisciplinary goals of care discussion: 12/9- updated s/o [name] Family and staff present n/a Summary of discussion n/a Follow up goals of care discussion due  Code Status: full Disposition: remain in ICU  Labs   CBC: Recent Labs  Lab 10/26/20 0430 10/27/20 0230 10/28/20 0310 11/01/20 0500  WBC 10.9* 10.2 8.6 12.3*  NEUTROABS  --  8.3* 7.5  --   HGB 12.4* 11.6* 12.4* 11.5*  HCT 39.3 37.4* 41.7 37.9*  MCV 104.8* 105.9* 111.8* 106.8*  PLT 261 242 231 093    Basic Metabolic Panel: Recent Labs  Lab 10/28/20 1207 10/29/20 0800 10/30/20 0818 10/31/20 0607 11/01/20 0500  NA 150* 146* 143 143 144  K 6.1* 5.7* 5.6* 5.1 5.2*  CL 119* 114* 111 113* 112*  CO2 24 24 23 23 23   GLUCOSE 179* 121* 168* 126* 119*  BUN 77* 81* 97* 93* 91*  CREATININE 1.39* 1.38* 1.72* 2.12* 1.81*  CALCIUM 8.2* 8.3* 8.6* 8.9 9.0   GFR: Estimated Creatinine Clearance: 62.6 mL/min (A) (by C-G formula based on SCr of 1.81 mg/dL (H)). Recent Labs  Lab 10/26/20 0430 10/27/20 0230 10/28/20 0310 11/01/20 0500  WBC 10.9* 10.2 8.6 12.3*    Liver Function Tests: Recent Labs  Lab 10/26/20 0430 10/27/20 0230 10/28/20 0310  AST 58* 36 36  ALT 51* 43 55*  ALKPHOS 59 53 62  BILITOT 0.9 0.7 0.4  PROT 6.1* 6.0* 6.5  ALBUMIN 2.6* 2.3* 2.3*   No results for input(s): LIPASE, AMYLASE in the last 168 hours. No results for input(s): AMMONIA in the last 168 hours.  ABG    Component Value  Date/Time   PHART 7.223 (L) 10/31/2020 1050   PCO2ART 60.6 (H) 10/31/2020 1050   PO2ART 69.2 (L) 10/31/2020 1050   HCO3 24.1 10/31/2020 1050   ACIDBASEDEF 3.8 (H) 10/31/2020 1050   O2SAT 92.3 10/31/2020 1050     Coagulation Profile: No results for input(s): INR, PROTIME in the last 168 hours.  Cardiac Enzymes: No results for input(s): CKTOTAL, CKMB, CKMBINDEX, TROPONINI in the  last 168 hours.  HbA1C: Hgb A1c MFr Bld  Date/Time Value Ref Range Status  10/22/2020 03:38 AM 5.3 4.8 - 5.6 % Final    Comment:    (NOTE)         Prediabetes: 5.7 - 6.4         Diabetes: >6.4         Glycemic control for adults with diabetes: <7.0     CBG: Recent Labs  Lab 10/31/20 1635 10/31/20 2033 11/01/20 0001 11/01/20 0406 11/01/20 0813  GLUCAP 128* 136* 110* 96 100*     This patient is critically ill with multiple organ system failure which requires frequent high complexity decision making, assessment, support, evaluation, and titration of therapies. This was completed through the application of advanced monitoring technologies and extensive interpretation of multiple databases. During this encounter critical care time was devoted to patient care services described in this note for 41 minutes.  Julian Hy, DO 11/01/20 9:35 AM [facility]

## 2020-11-01 NOTE — TOC Progression Note (Signed)
Transition of Care [facility]) - Progression Note    Patient Details  Name: Jackson Figueroa MRN: [id number] Date of Birth: [birth year]  Transition of Care [facility] CM/SW Contact  Leeroy Cha, RN Phone Number: 11/01/2020, 9:06 AM  Clinical Narrative:    [place] Hospital Events   11/28 [facility] emergency department where he was promptly intubated for hypoxemia and airway protection. In the emergency department he was started on empiric antibiotics and developed shock requiring norepinephrine infusion. He was transferred to [facility] for ICU admission. Therapeutics for COVID-19 initiated include dexamethasone and remdesivir. 11/29 shock state persisted. Central line placed, ARDS protocol continued, proning initiated 11/30: Pressor requirements continued, neuromuscular blockade initiated to facilitate ventilation tube feeds started, initiated proning 12/1: Stopping neuromuscular blockade, proning positioning protocol discontinued, added free water for rising creatinine and sodium 12/2: PEEP trending down, maintaining pulse oximetry. Renal function improved. Changed to versed from prop gtt d/t rising triglycerides  12/3 Remdesivir now completed. Peep/FIO2 stable. Free water adjusted. Solumedrol changed to pred 40mg , changing fentanyl to Dilaudid, adding Oxycodone 12/4-5 continued dyssynchrony, given paralytic overnight, changed to ketamine, started baricitinib. Vomited and aspirated> restarted paralytics, escalated antibiotics 12/6: proned, under NMB 12/7: proning, NMB continued. Switched antibiotics for stenotrophomonas Plan: undetermined at this time due to length of days on the vent and covid 19 infection Following for progression.   Expected Discharge Plan: Home/Self Care    Expected Discharge Plan and Services Expected Discharge Plan: Home/Self Care   Discharge Planning Services: CM Consult   Living arrangements for the past 2 months: Single Family Home                                        Social Determinants of Health (SDOH) Interventions    Readmission Risk Interventions No flowsheet data found.

## 2020-11-01 NOTE — Progress Notes (Signed)
Unproned at 1215-respiratory at bedside with no complications.

## 2020-11-01 NOTE — Plan of Care (Signed)
  Problem: Coping: Goal: Psychosocial and spiritual needs will be supported Outcome: Progressing   

## 2020-11-02 ENCOUNTER — Inpatient Hospital Stay (HOSPITAL_COMMUNITY): Payer: BC Managed Care – PPO

## 2020-11-02 DIAGNOSIS — R0689 Other abnormalities of breathing: Secondary | ICD-10-CM

## 2020-11-02 DIAGNOSIS — R7989 Other specified abnormal findings of blood chemistry: Secondary | ICD-10-CM

## 2020-11-02 DIAGNOSIS — U071 COVID-19: Secondary | ICD-10-CM

## 2020-11-02 DIAGNOSIS — D649 Anemia, unspecified: Secondary | ICD-10-CM

## 2020-11-02 LAB — BLOOD GAS, ARTERIAL
Acid-base deficit: 6.6 mmol/L — ABNORMAL HIGH (ref 0.0–2.0)
Acid-base deficit: 8.9 mmol/L — ABNORMAL HIGH (ref 0.0–2.0)
Bicarbonate: 22.1 mmol/L (ref 20.0–28.0)
Bicarbonate: 21.4 mmol/L (ref 20.0–28.0)
Drawn by: 29503
Drawn by: 29503
FIO2: 90
FIO2: 70
MECHVT: 540 mL
MECHVT: 540 mL
O2 Saturation: 97.4 %
O2 Saturation: 94.9 %
PEEP: 10 cmH2O
PEEP: 14 cmH2O
Patient temperature: 98.6
Patient temperature: 98.6
RATE: 25 resp/min
RATE: 25 resp/min
pCO2 arterial: 58.2 mmHg — ABNORMAL HIGH (ref 32.0–48.0)
pCO2 arterial: 63.3 mmHg — ABNORMAL HIGH (ref 32.0–48.0)
pH, Arterial: 7.203 — ABNORMAL LOW (ref 7.350–7.450)
pH, Arterial: 7.155 — CL (ref 7.350–7.450)
pO2, Arterial: 115 mmHg — ABNORMAL HIGH (ref 83.0–108.0)
pO2, Arterial: 89.3 mmHg (ref 83.0–108.0)

## 2020-11-02 LAB — URINALYSIS, ROUTINE W REFLEX MICROSCOPIC
Bilirubin Urine: NEGATIVE
Glucose, UA: NEGATIVE mg/dL
Ketones, ur: NEGATIVE mg/dL
Nitrite: NEGATIVE
Protein, ur: 30 mg/dL — AB
RBC / HPF: >50 RBC/hpf — ABNORMAL HIGH (ref 0–5)
Specific Gravity, Urine: 1.012 (ref 1.005–1.030)
pH: 5 (ref 5.0–8.0)

## 2020-11-02 LAB — CBC
HCT: 36.6 % — ABNORMAL LOW (ref 39.0–52.0)
Hemoglobin: 11.3 g/dL — ABNORMAL LOW (ref 13.0–17.0)
MCH: 32.8 pg (ref 26.0–34.0)
MCHC: 30.9 g/dL (ref 30.0–36.0)
MCV: 106.1 fL — ABNORMAL HIGH (ref 80.0–100.0)
Platelets: 208 10*3/uL (ref 150–400)
RBC: 3.45 MIL/uL — ABNORMAL LOW (ref 4.22–5.81)
RDW: 14.6 % (ref 11.5–15.5)
WBC: 16 10*3/uL — ABNORMAL HIGH (ref 4.0–10.5)
nRBC: 0 % (ref 0.0–0.2)

## 2020-11-02 LAB — PROTIME-INR
INR: 1.2 (ref 0.8–1.2)
Prothrombin Time: 14.9 seconds (ref 11.4–15.2)

## 2020-11-02 LAB — GLUCOSE, CAPILLARY
Glucose-Capillary: 130 mg/dL — ABNORMAL HIGH (ref 70–99)
Glucose-Capillary: 201 mg/dL — ABNORMAL HIGH (ref 70–99)
Glucose-Capillary: 121 mg/dL — ABNORMAL HIGH (ref 70–99)
Glucose-Capillary: 163 mg/dL — ABNORMAL HIGH (ref 70–99)
Glucose-Capillary: 140 mg/dL — ABNORMAL HIGH (ref 70–99)

## 2020-11-02 LAB — ECHOCARDIOGRAM LIMITED
Area-P 1/2: 3.91 cm2
Height: 72 in
S' Lateral: 3 cm
Weight: 4560.88 oz

## 2020-11-02 LAB — HEPARIN LEVEL (UNFRACTIONATED)
Heparin Unfractionated: 0.1 IU/mL — ABNORMAL LOW (ref 0.30–0.70)
Heparin Unfractionated: 0.33 IU/mL (ref 0.30–0.70)

## 2020-11-02 LAB — BASIC METABOLIC PANEL
Anion gap: 11 (ref 5–15)
BUN: 111 mg/dL — ABNORMAL HIGH (ref 6–20)
CO2: 21 mmol/L — ABNORMAL LOW (ref 22–32)
Calcium: 8.7 mg/dL — ABNORMAL LOW (ref 8.9–10.3)
Chloride: 111 mmol/L (ref 98–111)
Creatinine, Ser: 2.68 mg/dL — ABNORMAL HIGH (ref 0.61–1.24)
GFR, Estimated: 27 mL/min — ABNORMAL LOW (ref >60)
Glucose, Bld: 212 mg/dL — ABNORMAL HIGH (ref 70–99)
Potassium: 4.9 mmol/L (ref 3.5–5.1)
Sodium: 143 mmol/L (ref 135–145)

## 2020-11-02 LAB — MRSA PCR SCREENING: MRSA by PCR: NEGATIVE

## 2020-11-02 LAB — PHOSPHORUS: Phosphorus: 6.4 mg/dL — ABNORMAL HIGH (ref 2.5–4.6)

## 2020-11-02 LAB — D-DIMER, QUANTITATIVE: D-Dimer, Quant: 3.8 ug/mL-FEU — ABNORMAL HIGH (ref 0.00–0.50)

## 2020-11-02 LAB — APTT: aPTT: 33 seconds (ref 24–36)

## 2020-11-02 MED ORDER — SODIUM CHLORIDE 0.9 % IV SOLN
2.0000 g | Freq: Two times a day (BID) | INTRAVENOUS | Status: DC
  Administered 2020-11-02 – 2020-11-04 (×5): 2 g via INTRAVENOUS
  Filled 2020-11-02 (×5): qty 2

## 2020-11-02 MED ORDER — VANCOMYCIN HCL 2000 MG/400ML IV SOLN
2000.0000 mg | Freq: Once | INTRAVENOUS | Status: AC
  Administered 2020-11-02: 12:00:00 2000 mg via INTRAVENOUS
  Filled 2020-11-02: qty 400

## 2020-11-02 MED ORDER — VANCOMYCIN VARIABLE DOSE PER UNSTABLE RENAL FUNCTION (PHARMACIST DOSING): Status: DC

## 2020-11-02 MED ORDER — HEPARIN (PORCINE) 25000 UT/250ML-% IV SOLN
2000.0000 [IU]/h | INTRAVENOUS | Status: DC
  Administered 2020-11-02: 20:00:00 2000 [IU]/h via INTRAVENOUS
  Administered 2020-11-02: 10:00:00 1900 [IU]/h via INTRAVENOUS
  Administered 2020-11-03: 09:00:00 2000 [IU]/h via INTRAVENOUS
  Filled 2020-11-02 (×3): qty 250

## 2020-11-02 MED ORDER — INSULIN ASPART 100 UNIT/ML ~~LOC~~ SOLN
1.0000 [IU] | SUBCUTANEOUS | Status: DC
  Administered 2020-11-02 – 2020-11-07 (×21): 1 [IU] via SUBCUTANEOUS

## 2020-11-02 MED ORDER — HEPARIN BOLUS VIA INFUSION
3000.0000 [IU] | INTRAVENOUS | Status: AC
  Administered 2020-11-02: 10:00:00 3000 [IU] via INTRAVENOUS
  Filled 2020-11-02: qty 3000

## 2020-11-02 MED ORDER — SENNA 8.6 MG PO TABS
1.0000 | ORAL_TABLET | Freq: Once | ORAL | Status: AC
  Administered 2020-11-02: 12:00:00 8.6 mg via ORAL
  Filled 2020-11-02: qty 1

## 2020-11-02 MED ORDER — LEVOFLOXACIN IN D5W 750 MG/150ML IV SOLN
750.0000 mg | INTRAVENOUS | Status: AC
  Administered 2020-11-03 – 2020-11-05 (×2): 750 mg via INTRAVENOUS
  Filled 2020-11-02 (×3): qty 150

## 2020-11-02 NOTE — Progress Notes (Signed)
Patient placed in prone position without incident. VSS after patient proned. Felicity, RN from [facility], notified of small amount of frank bleeding from OGT after turning.

## 2020-11-02 NOTE — Progress Notes (Signed)
[facility] for Heparin IV  Indication: suspected PE  No Known Allergies  Patient Measurements: Height: 6' (182.9 cm) Weight: 129.3 kg (285 lb 0.9 oz) IBW/kg (Calculated) : 77.6 Heparin Dosing Weight: 104 kg  Vital Signs: Temp: 96.98 F (36.1 C) (12/11 1800) BP: 147/76 (12/11 1600) Pulse Rate: 94 (12/11 1800)  Labs: Recent Labs    10/31/20 0607 11/01/20 0500 11/02/20 0630 11/02/20 0956 11/02/20 1727  HGB  --  11.5* 11.3*  --   --   HCT  --  37.9* 36.6*  --   --   PLT  --  199 208  --   --   APTT  --   --   --  33  --   LABPROT  --   --   --  14.9  --   INR  --   --   --  1.2  --   HEPARINUNFRC  --   --   --  0.10* 0.33  CREATININE 2.12* 1.81* 2.68*  --   --     Estimated Creatinine Clearance: 42.3 mL/min (A) (by C-G formula based on SCr of 2.68 mg/dL (H)).   Medical History: Past Medical History:  Diagnosis Date  . Obesity     Medications:  Scheduled:  . artificial tears  1 application Both Eyes Z6X  . chlorhexidine gluconate (MEDLINE KIT)  15 mL Mouth Rinse BID  . Chlorhexidine Gluconate Cloth  6 each Topical Q0600  . clonazePAM  2 mg Per Tube Q8H  . docusate  100 mg Per Tube Daily  . famotidine  20 mg Per Tube BID  . feeding supplement (PROSource TF)  90 mL Per Tube QID  . insulin aspart  0-15 Units Subcutaneous Q4H  . insulin aspart  1 Units Subcutaneous Q4H  . insulin glargine  5 Units Subcutaneous Daily  . linagliptin  5 mg Per Tube Daily  . mouth rinse  15 mL Mouth Rinse 10 times per day  . metoCLOPramide (REGLAN) injection  10 mg Intravenous Q12H  . multivitamin  15 mL Per Tube Daily  . oxyCODONE  10 mg Per Tube Q6H  . polyethylene glycol  17 g Per Tube Daily  . predniSONE  40 mg Per Tube Q breakfast  . sodium chloride flush  10-40 mL Intracatheter Q12H  . sodium zirconium cyclosilicate  10 g Per Tube BID  . thiamine  100 mg Per Tube Daily  . vancomycin variable dose per unstable renal function (pharmacist  dosing)   Does not apply See admin instructions   Infusions:  . sodium chloride 10 mL/hr at 11/02/20 0600  . ceFEPime (MAXIPIME) IV Stopped (11/02/20 1128)  . dextrose 100 mL/hr at 11/02/20 1008  . feeding supplement (PIVOT 1.5 CAL) 1,000 mL (11/02/20 1710)  . heparin 1,900 Units/hr (11/02/20 1010)  . HYDROmorphone 4 mg/hr (11/02/20 1328)  . [START ON 11/03/2020] levofloxacin (LEVAQUIN) IV    . midazolam 7.5 mg/hr (11/02/20 1731)  . vecuronium (NORCURON) infusion 100mg /144mL (1 mg/mL) Stopped (11/01/20 0527)    Assessment: 79 yoM admitted with COVID pneumonia on 11/28.  He has been on prophylactic Lovenox 0.5 mg/kg daily (no missed doses).  Pharmacy is now consulted to change to Heparin for suspected PE with sudden onset tachycardia and hypoxia. - Last Lovenox 60mg  dose on 12/10 ~10 am.  Today, 11/02/2020:  CBC: Hgb low/stable at 11.2, Plt WNL  SCr acutely increased 1.8 > 2.68   Baseline aPTT, INR WNL  Goal  of Therapy:  Heparin level 0.3-0.7 units/ml Monitor platelets by anticoagulation protocol: Yes   Plan:   Increase heparin to 2000 units/hr  Check confirmatory level with AM labs  Daily heparin level and CBC  Follow up imaging if/when available.   Reuel Boom, PharmD, BCPS [phone] 11/02/2020, 7:00 PM

## 2020-11-02 NOTE — Progress Notes (Signed)
CRITICAL VALUE ALERT  Critical Value: pH 7.155  Date & Time Notied:  11/02/2020 1658  Provider Notified: Carlis Abbott MD  Orders Received/Actions taken: Patient to be prone at 1800

## 2020-11-02 NOTE — Progress Notes (Signed)
Lower extremity venous bilateral study completed.  Preliminary results relayed to DO.   See CV Proc for preliminary results report.   Darlin Coco, RDMS

## 2020-11-02 NOTE — Progress Notes (Signed)
Pharmacy Antibiotic Note  Jackson Figueroa is a 59 y.o. male admitted on 10/20/2020 with COVID pneumonia.  Pharmacy has been consulted for Levaquin dosing for Stenotrophomonas pneumonia, and adding Vancomycin and Cefepime for clinical worsening/fever overnight.   WBC increased to 16 (remains on daily prednisone)  Tm 101.3  SCr acutely increased to 2.68  Plan:  Reduce to Levaquin 750mg  IV q24h  Cefepime 2g IV q12h  Vancomycin 2g IV x1 dose, then further dosing pending renal function.  Goal AUC = 400 - 500.  Follow up renal function, culture results, and clinical course.  Follow up antibiotic de-escalation.  Height: 6' (182.9 cm) Weight: 129.3 kg (285 lb 0.9 oz) IBW/kg (Calculated) : 77.6  Temp (24hrs), Avg:99.9 F (37.7 C), Min:98.78 F (37.1 C), Max:101.3 F (38.5 C)  Recent Labs  Lab 10/27/20 0230 10/28/20 0310 10/28/20 1207 10/29/20 0800 10/30/20 0818 10/31/20 0607 11/01/20 0500 11/02/20 0630  WBC 10.2 8.6  --   --   --   --  12.3* 16.0*  CREATININE 1.17 1.49*   < > 1.38* 1.72* 2.12* 1.81* 2.68*   < > = values in this interval not displayed.    Estimated Creatinine Clearance: 42.3 mL/min (A) (by C-G formula based on SCr of 2.68 mg/dL (H)).    No Known Allergies  Antimicrobials this admission: 11/28 >Rocephin/azith> 11/29 11/29 V/Z x1  Vancomycin 12/5 >>12/6, resumed 12/11 >>  Cefepime 12/5 >>12/7, resumed 12/11 >>  Levaquin 12/7 >>  Microbiology results: 11/28 BCx: ngF 11/28 UCx: ngF  11/28 MRSA PCR: Negative 11/28 COVID: Positive 12/5 sputum: abundant Stenotrophomonas maltophilia (Sens: LVQ, Bactrim) 12/6 MRSA PCR: negative 12/11 Tracheal aspirate:  12/11 BCx:  12/11 MRSA PCR:  12/11 UCx:   Thank you for allowing pharmacy to be a part of this patient's care.  Gretta Arab PharmD, BCPS Clinical Pharmacist [facility] main pharmacy [phone] 11/02/2020 8:23 AM

## 2020-11-02 NOTE — Progress Notes (Signed)
  Echocardiogram 2D Echocardiogram has been performed.  Jackson Figueroa 11/02/2020, 10:43 AM

## 2020-11-02 NOTE — Progress Notes (Signed)
E-link notified of patient's brief bigeminy and current temp of 38.5. RN Felicity notified via phone.

## 2020-11-02 NOTE — Plan of Care (Signed)
S/o [name] updated over the phone. She understands that he had a decompensation overnight - she agrees with the plan to treat empirically for a possible PE rather than perform CTA given risk with AKI and contrast requirement if a CTA was performed.   Julian Hy, DO 11/02/20 5:39 PM [facility]

## 2020-11-02 NOTE — Plan of Care (Signed)
  Problem: Coping: Goal: Psychosocial and spiritual needs will be supported Outcome: Progressing   

## 2020-11-02 NOTE — Progress Notes (Signed)
ANTICOAGULATION CONSULT NOTE - Initial Consult  Pharmacy Consult for Heparin IV  Indication: suspected PE  No Known Allergies  Patient Measurements: Height: 6' (182.9 cm) Weight: 129.3 kg (285 lb 0.9 oz) IBW/kg (Calculated) : 77.6 Heparin Dosing Weight: 104 kg  Vital Signs: Temp: 98.96 F (37.2 C) (12/11 0800) Temp Source: Bladder (12/11 0600) BP: 153/73 (12/11 0800) Pulse Rate: 107 (12/11 0800)  Labs: Recent Labs    10/31/20 0607 11/01/20 0500 11/02/20 0630  HGB  --  11.5* 11.3*  HCT  --  37.9* 36.6*  PLT  --  199 208  CREATININE 2.12* 1.81* 2.68*    Estimated Creatinine Clearance: 42.3 mL/min (A) (by C-G formula based on SCr of 2.68 mg/dL (H)).   Medical History: Past Medical History:  Diagnosis Date  . Obesity     Medications:  Scheduled:  . artificial tears  1 application Both Eyes B8M  . chlorhexidine gluconate (MEDLINE KIT)  15 mL Mouth Rinse BID  . Chlorhexidine Gluconate Cloth  6 each Topical Q0600  . clonazePAM  2 mg Per Tube Q8H  . docusate  100 mg Per Tube Daily  . enoxaparin (LOVENOX) injection  60 mg Subcutaneous Daily  . famotidine  20 mg Per Tube BID  . feeding supplement (PROSource TF)  90 mL Per Tube QID  . insulin aspart  0-15 Units Subcutaneous Q4H  . insulin glargine  5 Units Subcutaneous Daily  . linagliptin  5 mg Per Tube Daily  . mouth rinse  15 mL Mouth Rinse 10 times per day  . metoCLOPramide (REGLAN) injection  10 mg Intravenous Q12H  . multivitamin  15 mL Per Tube Daily  . oxyCODONE  10 mg Per Tube Q6H  . polyethylene glycol  17 g Per Tube Daily  . predniSONE  40 mg Per Tube Q breakfast  . sodium chloride flush  10-40 mL Intracatheter Q12H  . sodium zirconium cyclosilicate  10 g Per Tube BID  . thiamine  100 mg Per Tube Daily   Infusions:  . sodium chloride 10 mL/hr at 11/02/20 0600  . dextrose 100 mL/hr at 11/02/20 0600  . feeding supplement (PIVOT 1.5 CAL) Stopped (10/30/20 1532)  . HYDROmorphone 4 mg/hr (11/02/20 0600)   . levofloxacin (LEVAQUIN) IV Stopped (11/01/20 1701)  . midazolam 7.5 mg/hr (11/02/20 0600)  . vecuronium (NORCURON) infusion 114m/100mL (1 mg/mL) Stopped (11/01/20 0527)    Assessment: 55yoM admitted with COVID pneumonia on 11/28.  He has been on prophylactic Lovenox 0.5 mg/kg daily (no missed doses).  Pharmacy is now consulted to change to Heparin for suspected PE with sudden onset tachycardia and hypoxia. - Last Lovenox 639mdose on 12/10 ~10 am.  Today, 11/02/2020: CBC: Hgb low/stable at 11.2, Plt WNL SCr acutely increased 1.8 > 2.68  Baseline coag pending.  Goal of Therapy:  Heparin level 0.3-0.7 units/ml Monitor platelets by anticoagulation protocol: Yes   Plan:  Baseline PTT, PT/INR Give heparin 3000 units bolus IV x 1 Start heparin IV infusion at 1900 units/hr Heparin level 6 hours after starting Daily heparin level and CBC Follow up imaging if/when available.   ChGretta ArabharmD, BCPS Clinical Pharmacist [facility] main pharmacy [phone]/09/2020 9:10 AM

## 2020-11-02 NOTE — Progress Notes (Signed)
NAME:  Jackson Figueroa, MRN:  [id number], DOB:  06-Jan-1961, LOS: 55 ADMISSION DATE:  10/20/2020, CONSULTATION DATE:  11/29 REFERRING MD:  Sabra Heck, CHIEF COMPLAINT:  ARDS COVID 40   Brief History   59 year old male with no significant past medical history and a positive COVID-19 test on 11/26 presented to [facility] with profound hypoxemia on 11/28.  Intubated immediately upon arrival found to be in ARDS.  Transferred to [facility] for ongoing ICU care in the early a.m. hours of 11/29.  Past Medical History  obesity  Significant Hospital Events   11/28 [facility] emergency department where he was promptly intubated for hypoxemia and airway protection.  In the emergency department he was started on empiric antibiotics and developed shock requiring norepinephrine infusion.  He was transferred to [facility] for ICU admission.  Therapeutics for COVID-19 initiated include dexamethasone and remdesivir. 11/29 shock state persisted. Central line placed, ARDS protocol continued, proning initiated 11/30: Pressor requirements continued, neuromuscular blockade initiated to facilitate ventilation tube feeds started, initiated proning 12/1: Stopping neuromuscular blockade, proning positioning protocol discontinued, added free water for rising creatinine and sodium 12/2: PEEP trending down, maintaining pulse oximetry.  Renal function improved. Changed to versed from prop gtt d/t rising triglycerides  12/3 Remdesivir now completed. Peep/FIO2 stable. Free water adjusted. Solumedrol changed to pred 40mg , changing fentanyl to Dilaudid, adding Oxycodone  12/4-5 continued dyssynchrony, given paralytic overnight, changed to ketamine, started baricitinib. Vomited and aspirated> restarted paralytics, escalated antibiotics 12/6: proned, under NMB 12/7: proning, NMB continued. Switched antibiotics for stenotrophomonas 12/8:  Consults:    Procedures:  ETT 11/28 > CVL 11/29 >  Significant  Diagnostic Tests:  Echo 11/29 > LVEF 50-55%, mild LVH, RV systolic function mildly reduced. Mild dilation of ascending aorta 2mm.  Micro Data:  Blood 11/29 >NG Urine 11/28 > NG SARS COV2/Flu > negative 12/5 resp> few GPC> stenotrophomonas> sensitive to Bactrim & levofloxacin  Antimicrobials:  CTX 11/28 > 11/29 Azithromycin 11/28 > 11/29  remdesivir 11/28 >  Prednisone 11/28 >  baricitinib 12/4 >   12/5 vanc > 12/7, restarted 12/11> 12/5 cefepime > 12/7, restarted 12/11 12/7 levofloxacin>   Interim history/subjective:  Suddenly overnight tachycardic, hypoxic, febrile. FiO2 up to 90%. Off continuous paralytics.  Objective   Blood pressure (!) 153/73, pulse (!) 107, temperature 98.96 F (37.2 C), resp. rate 17, height 6' (1.829 m), weight 129.3 kg, SpO2 94 %.    Vent Mode: PRVC FiO2 (%):  [50 %-90 %] 90 % Set Rate:  [24 bmp-25 bmp] 25 bmp Vt Set:  [540 mL] 540 mL PEEP:  [10 cmH20] 10 cmH20 Plateau Pressure:  [14 cmH20-21 cmH20] 14 cmH20   Intake/Output Summary (Last 24 hours) at 11/02/2020 0842 Last data filed at 11/02/2020 0600 Gross per 24 hour  Intake 3064.89 ml  Output 4250 ml  Net -1185.11 ml   Filed Weights   10/30/20 0500 10/31/20 0500 11/01/20 0438  Weight: 131 kg 130 kg 129.3 kg    Examination: General:  HEENT: [place]/AT, eyes anicteric. Much improved periorbital and facial edema today.  PULM: mild vent dyssynchrony, Pplat 25, DP 15, peak pressures around 30 despite occasional dyssynchrony. CTAB. CV:  NSR on tele. GI: obese, soft, unable to auscultate bowel sounds MSK: L>R Le edema, normal muscle mass Derm: pallor, skin dry. No echcymoses.  Neuro: RASS -5, breathing above the vent. Pinpoint pupil on the right. Left not examined due to positioning.  CXR personally reviewed> persistent bilateral opacities, especially in the  bases, but not significantly different than previous.  Resolved Hospital Problem list   Septic shock due to Stenotrophomonas  pneumonia  Assessment & Plan:  ARDS due to COVID 19 pneumonia> worse due to aspiration episode on 12/5 and pneumonia. Today on 50%. No respiratory decompensation since vomiting episode on 12/8 thankfully. P:F 128 today. Lung compliance remains good. Stenotrophomonas pneumonia  High risk for acute PE -Con't LTVV, 4-8cc/kg IBW with goal Pplat<30 and DP<15. Permissive hypercania ok. -Titrate PEEP and FiO2 per ARDS protocol - need to go up on PEEP today and down on FiO2 -VAP prevention protocol -7 days of levofloxacin for Stenotrophomonas -prone 16 h/d until P:F>150; ABG this morning   -con't heavy sedation for now -con't steroids - prednisone 40mg  daily -trach aspirate and empiric HAP coverage -Start heparin infusion empirically given strong suspicion for PE. Checking D-dimer, echo, BLE dopplers to evaluate for VTE. Unfortunately high risk for worsened AKI with contrast administration and would like to avoid CTA if possible.   Severe ventilator dyssynchrony-- ok for now on heavy sedation; has caused issues leading to vomiting episodes previously Acute metabolic encephalopathy due to sedation -Continue enteral clonazepam & oxycodone -No longer requiring ketamine -Con't PRN vecuronium for significant dyssynchrony -Continue dilaudid and versed drips -RASS target -4 to -5  Vomiting, suspect ileus -con't reglan -slowly advance TF and monitor residuals -Feeding through Panda tube, gastric tube to LIWS to monitor for TF intolerance.  AKI-stable. Not oliguric. Suspect ATN. Was previously improving prior to worsening overnight with decompensation. Hyperkalemia- persistent Hyperphosphatemia due to AKI -Monitor BMET and UOP daily -lokelma increaed to twice daily for maintenance; had increased K+ when reduced to once daily -goal of euvolemia; auto-diuresing -renally dose meds, avoid nephrotoxic meds  Hypernatremia - improving -Holding free water via tube, continue D5 given vomiting until able  to tolerate TFs given high enteral water requirements. Con't supplemental thiamine while D5W is on. -Holding diuretics  -con't to monitor renal function -strict I/Os  Hyperglycemia- uncontrolled -Continue glargine once daily SSI, linagliptin -goal BG 140-180 -adding TF coverage 1 unit Q4h with hold parameters; con't low dose levemir to coverage D5w  Nutrition -advance TF, monitor residuals -con't reglan  Diarrhea-resolved -Con't low-dose docusate -removed FMS previously -senna x 1 today since last BM 12/8  Acute anemia likely due to critical illness- remains stable -transfuse for Hb<7 or hemodynamically significant bleeding -con't to monitor  LLE edema- no DVT on 12/8 [place] -con't to monitor -autodiuresing   Best practice (evaluated daily)   Diet: tube feeding- on hold due to vomiting Pain/Anxiety/Delirium protocol (if indicated): as above VAP protocol (if indicated): yes DVT prophylaxis: lovenox GI prophylaxis: famotidine Glucose control: as above Mobility: bed rest last date of multidisciplinary goals of care discussion: 12/9- updated s/o [name] Family and staff present n/a Summary of discussion n/a Follow up goals of care discussion due  Code Status: full Disposition: remain in ICU  Labs   CBC: Recent Labs  Lab 10/27/20 0230 10/28/20 0310 11/01/20 0500 11/02/20 0630  WBC 10.2 8.6 12.3* 16.0*  NEUTROABS 8.3* 7.5  --   --   HGB 11.6* 12.4* 11.5* 11.3*  HCT 37.4* 41.7 37.9* 36.6*  MCV 105.9* 111.8* 106.8* 106.1*  PLT 242 231 199 505    Basic Metabolic Panel: Recent Labs  Lab 10/29/20 0800 10/30/20 0818 10/31/20 0607 11/01/20 0500 11/02/20 0630  NA 146* 143 143 144 143  K 5.7* 5.6* 5.1 5.2* 4.9  CL 114* 111 113* 112* 111  CO2 24 23 23  23 21*  GLUCOSE 121* 168* 126* 119* 212*  BUN 81* 97* 93* 91* PENDING  CREATININE 1.38* 1.72* 2.12* 1.81* 2.68*  CALCIUM 8.3* 8.6* 8.9 9.0 8.7*  PHOS  --   --   --   --  6.4*   GFR: Estimated Creatinine Clearance:  42.3 mL/min (A) (by C-G formula based on SCr of 2.68 mg/dL (H)). Recent Labs  Lab 10/27/20 0230 10/28/20 0310 11/01/20 0500 11/02/20 0630  WBC 10.2 8.6 12.3* 16.0*    Liver Function Tests: Recent Labs  Lab 10/27/20 0230 10/28/20 0310  AST 36 36  ALT 43 55*  ALKPHOS 53 62  BILITOT 0.7 0.4  PROT 6.0* 6.5  ALBUMIN 2.3* 2.3*   No results for input(s): LIPASE, AMYLASE in the last 168 hours. No results for input(s): AMMONIA in the last 168 hours.  ABG    Component Value Date/Time   PHART 7.293 (L) 11/01/2020 1331   PCO2ART 50.3 (H) 11/01/2020 1331   PO2ART 84.9 11/01/2020 1331   HCO3 23.6 11/01/2020 1331   ACIDBASEDEF 2.8 (H) 11/01/2020 1331   O2SAT 95.2 11/01/2020 1331     Coagulation Profile: No results for input(s): INR, PROTIME in the last 168 hours.  Cardiac Enzymes: No results for input(s): CKTOTAL, CKMB, CKMBINDEX, TROPONINI in the last 168 hours.  HbA1C: Hgb A1c MFr Bld  Date/Time Value Ref Range Status  10/22/2020 03:38 AM 5.3 4.8 - 5.6 % Final    Comment:    (NOTE)         Prediabetes: 5.7 - 6.4         Diabetes: >6.4         Glycemic control for adults with diabetes: <7.0     CBG: Recent Labs  Lab 11/01/20 1627 11/01/20 2012 11/01/20 2323 11/02/20 0342 11/02/20 0804  GLUCAP 105* 98 98 130* 201*     This patient is critically ill with multiple organ system failure which requires frequent high complexity decision making, assessment, support, evaluation, and titration of therapies. This was completed through the application of advanced monitoring technologies and extensive interpretation of multiple databases. During this encounter critical care time was devoted to patient care services described in this note for 49 minutes.  Julian Hy, DO 11/02/20 10:52 AM [facility]

## 2020-11-03 DIAGNOSIS — J9602 Acute respiratory failure with hypercapnia: Secondary | ICD-10-CM

## 2020-11-03 LAB — GLUCOSE, CAPILLARY
Glucose-Capillary: 120 mg/dL — ABNORMAL HIGH (ref 70–99)
Glucose-Capillary: 122 mg/dL — ABNORMAL HIGH (ref 70–99)
Glucose-Capillary: 155 mg/dL — ABNORMAL HIGH (ref 70–99)
Glucose-Capillary: 153 mg/dL — ABNORMAL HIGH (ref 70–99)
Glucose-Capillary: 167 mg/dL — ABNORMAL HIGH (ref 70–99)

## 2020-11-03 LAB — CBC
HCT: 38.5 % — ABNORMAL LOW (ref 39.0–52.0)
Hemoglobin: 11.8 g/dL — ABNORMAL LOW (ref 13.0–17.0)
MCH: 32.6 pg (ref 26.0–34.0)
MCHC: 30.6 g/dL (ref 30.0–36.0)
MCV: 106.4 fL — ABNORMAL HIGH (ref 80.0–100.0)
Platelets: 214 10*3/uL (ref 150–400)
RBC: 3.62 MIL/uL — ABNORMAL LOW (ref 4.22–5.81)
RDW: 14.3 % (ref 11.5–15.5)
WBC: 19 10*3/uL — ABNORMAL HIGH (ref 4.0–10.5)
nRBC: 0 % (ref 0.0–0.2)

## 2020-11-03 LAB — BASIC METABOLIC PANEL
Anion gap: 13 (ref 5–15)
BUN: 136 mg/dL — ABNORMAL HIGH (ref 6–20)
CO2: 21 mmol/L — ABNORMAL LOW (ref 22–32)
Calcium: 8.7 mg/dL — ABNORMAL LOW (ref 8.9–10.3)
Chloride: 110 mmol/L (ref 98–111)
Creatinine, Ser: 3.2 mg/dL — ABNORMAL HIGH (ref 0.61–1.24)
GFR, Estimated: 22 mL/min — ABNORMAL LOW (ref >60)
Glucose, Bld: 130 mg/dL — ABNORMAL HIGH (ref 70–99)
Potassium: 5.1 mmol/L (ref 3.5–5.1)
Sodium: 144 mmol/L (ref 135–145)

## 2020-11-03 LAB — HEPARIN LEVEL (UNFRACTIONATED): Heparin Unfractionated: 0.33 IU/mL (ref 0.30–0.70)

## 2020-11-03 MED ORDER — DEXMEDETOMIDINE HCL IN NACL 200 MCG/50ML IV SOLN
0.4000 ug/kg/h | INTRAVENOUS | Status: DC
  Administered 2020-11-03: 10:00:00 0.7 ug/kg/h via INTRAVENOUS
  Administered 2020-11-03: 08:00:00 0.4 ug/kg/h via INTRAVENOUS
  Filled 2020-11-03 (×2): qty 50

## 2020-11-03 MED ORDER — SENNOSIDES-DOCUSATE SODIUM 8.6-50 MG PO TABS: 1.0000 | ORAL_TABLET | Freq: Once | ORAL | Status: DC

## 2020-11-03 MED ORDER — DEXMEDETOMIDINE HCL IN NACL 200 MCG/50ML IV SOLN: 0.4000 ug/kg/h | INTRAVENOUS | Status: DC

## 2020-11-03 MED ORDER — SODIUM CHLORIDE 0.9 % IV SOLN
1.0000 mg/h | INTRAVENOUS | Status: DC
  Administered 2020-11-03 – 2020-11-05 (×4): 4 mg/h via INTRAVENOUS
  Filled 2020-11-03 (×7): qty 5

## 2020-11-03 MED ORDER — CISATRACURIUM BOLUS VIA INFUSION
10.0000 mg | Freq: Once | INTRAVENOUS | Status: DC
  Filled 2020-11-03: qty 10

## 2020-11-03 MED ORDER — DEXMEDETOMIDINE HCL IN NACL 400 MCG/100ML IV SOLN
0.4000 ug/kg/h | INTRAVENOUS | Status: DC
  Administered 2020-11-03: 15:00:00 0.8 ug/kg/h via INTRAVENOUS
  Administered 2020-11-03: 11:00:00 0.7 ug/kg/h via INTRAVENOUS
  Filled 2020-11-03 (×2): qty 100

## 2020-11-03 MED ORDER — VECURONIUM BOLUS VIA INFUSION
0.0800 mg/kg | Freq: Once | INTRAVENOUS | Status: DC
  Filled 2020-11-03: qty 11

## 2020-11-03 MED ORDER — DOCUSATE SODIUM 50 MG/5ML PO LIQD
50.0000 mg | Freq: Once | ORAL | Status: AC
  Administered 2020-11-03: 14:00:00 50 mg
  Filled 2020-11-03: qty 10

## 2020-11-03 MED ORDER — NOREPINEPHRINE 4 MG/250ML-% IV SOLN
0.0000 ug/min | INTRAVENOUS | Status: DC
  Administered 2020-11-03: 09:00:00 2 ug/min via INTRAVENOUS
  Administered 2020-11-03: 17:00:00 8 ug/min via INTRAVENOUS
  Administered 2020-11-04: 10:00:00 4 ug/min via INTRAVENOUS
  Filled 2020-11-03 (×3): qty 250

## 2020-11-03 MED ORDER — SODIUM CHLORIDE 0.9 % IV SOLN
0.0000 ug/kg/min | INTRAVENOUS | Status: DC
  Administered 2020-11-03: 11:00:00 9.5 ug/kg/min via INTRAVENOUS
  Administered 2020-11-03: 08:00:00 3 ug/kg/min via INTRAVENOUS
  Administered 2020-11-03: 14:00:00 9.5 ug/kg/min via INTRAVENOUS
  Administered 2020-11-03: 20:00:00 6 ug/kg/min via INTRAVENOUS
  Administered 2020-11-03: 17:00:00 9.5 ug/kg/min via INTRAVENOUS
  Administered 2020-11-04: 02:00:00 3.5 ug/kg/min via INTRAVENOUS
  Administered 2020-11-04: 09:00:00 4 ug/kg/min via INTRAVENOUS
  Filled 2020-11-03 (×9): qty 20

## 2020-11-03 MED ORDER — MIDAZOLAM 50MG/50ML (1MG/ML) PREMIX INFUSION
2.0000 mg/h | INTRAVENOUS | Status: DC
  Administered 2020-11-03 – 2020-11-05 (×11): 10 mg/h via INTRAVENOUS
  Filled 2020-11-03 (×11): qty 50

## 2020-11-03 MED ORDER — SENNOSIDES 8.8 MG/5ML PO SYRP
10.0000 mL | ORAL_SOLUTION | Freq: Once | ORAL | Status: AC
  Administered 2020-11-03: 16:00:00 10 mL
  Filled 2020-11-03 (×2): qty 10

## 2020-11-03 MED ORDER — HEPARIN SODIUM (PORCINE) 10000 UNIT/ML IJ SOLN
7500.0000 [IU] | Freq: Three times a day (TID) | INTRAMUSCULAR | Status: DC
  Administered 2020-11-03 – 2020-11-06 (×11): 7500 [IU] via SUBCUTANEOUS
  Filled 2020-11-03: qty 0.75
  Filled 2020-11-03: qty 1
  Filled 2020-11-03 (×8): qty 0.75

## 2020-11-03 MED ORDER — VECURONIUM BROMIDE 10 MG IV SOLR: 0.0000 ug/kg/min | Status: DC

## 2020-11-03 MED ORDER — MIDAZOLAM BOLUS VIA INFUSION
1.0000 mg | INTRAVENOUS | Status: DC | PRN
  Administered 2020-11-03 – 2020-11-04 (×2): 2 mg via INTRAVENOUS
  Filled 2020-11-03: qty 2

## 2020-11-03 MED ORDER — ARTIFICIAL TEARS OPHTHALMIC OINT: 1.0000 "application " | TOPICAL_OINTMENT | Freq: Three times a day (TID) | OPHTHALMIC | Status: DC

## 2020-11-03 MED ORDER — VECURONIUM BROMIDE 10 MG IV SOLR
0.1000 mg/kg | Freq: Once | INTRAVENOUS | Status: AC
  Administered 2020-11-03: 08:00:00 12.9 mg via INTRAVENOUS
  Filled 2020-11-03: qty 20

## 2020-11-03 MED ORDER — HYDROMORPHONE HCL 1 MG/ML IJ SOLN
1.0000 mg | Freq: Once | INTRAMUSCULAR | Status: AC
Start: 2020-11-03 — End: 2020-11-03
  Administered 2020-11-03: 08:00:00 1 mg via INTRAVENOUS

## 2020-11-03 NOTE — Progress Notes (Signed)
NAME:  Jackson Figueroa, MRN:  [id number], DOB:  1961-10-03, LOS: 30 ADMISSION DATE:  10/20/2020, CONSULTATION DATE:  11/29 REFERRING MD:  Sabra Heck, CHIEF COMPLAINT:  ARDS COVID 62   Brief History   59 year old male with no significant past medical history and a positive COVID-19 test on 11/26 presented to [facility] with profound hypoxemia on 11/28.  Intubated immediately upon arrival found to be in ARDS.  Transferred to [facility] for ongoing ICU care in the early a.m. hours of 11/29.  Past Medical History  obesity  Significant Hospital Events   11/28 [facility] emergency department where he was promptly intubated for hypoxemia and airway protection.  In the emergency department he was started on empiric antibiotics and developed shock requiring norepinephrine infusion.  He was transferred to [facility] for ICU admission.  Therapeutics for COVID-19 initiated include dexamethasone and remdesivir. 11/29 shock state persisted. Central line placed, ARDS protocol continued, proning initiated 11/30: Pressor requirements continued, neuromuscular blockade initiated to facilitate ventilation tube feeds started, initiated proning 12/1: Stopping neuromuscular blockade, proning positioning protocol discontinued, added free water for rising creatinine and sodium 12/2: PEEP trending down, maintaining pulse oximetry.  Renal function improved. Changed to versed from prop gtt d/t rising triglycerides  12/3 Remdesivir now completed. Peep/FIO2 stable. Free water adjusted. Solumedrol changed to pred 40mg , changing fentanyl to Dilaudid, adding Oxycodone  12/4-5 continued dyssynchrony, given paralytic overnight, changed to ketamine, started baricitinib. Vomited and aspirated> restarted paralytics, escalated antibiotics 12/6: proned, under NMB 12/7: proning, NMB continued. Switched antibiotics for stenotrophomonas 12/8:  Consults:    Procedures:  ETT 11/28 > CVL 11/29 >  Significant  Diagnostic Tests:  Echo 11/29 > LVEF 50-55%, mild LVH, RV systolic function mildly reduced. Mild dilation of ascending aorta 59mm.  Micro Data:  Blood 11/29 >NG Urine 11/28 > NG SARS COV2/Flu > negative 12/5 resp> few GPC> stenotrophomonas> sensitive to Bactrim & levofloxacin  12/11 respiratory-abundant, moderate GNR, rare GPC 12/11 blood> 12/11 urine>  Antimicrobials:  CTX 11/28 > 11/29 Azithromycin 11/28 > 11/29  remdesivir 11/28 >  Prednisone 11/28 >  baricitinib 12/4 >   12/5 vanc > 12/7, restarted 12/11> 12/5 cefepime > 12/7, restarted 12/11 12/7 levofloxacin>   Interim history/subjective:  Increasing vent dyssynchrony today requiring paralytics. Remains sedated.  Objective   Blood pressure (!) 147/76, pulse (!) 110, temperature 97.7 F (36.5 C), temperature source Bladder, resp. rate 15, height 6' (1.829 m), weight 129.3 kg, SpO2 98 %.    Vent Mode: PRVC FiO2 (%):  [70 %] 70 % Set Rate:  [25 bmp] 25 bmp Vt Set:  [540 mL] 540 mL PEEP:  [14 cmH20] 14 cmH20 Plateau Pressure:  [19 cmH20-28 cmH20] 25 cmH20   Intake/Output Summary (Last 24 hours) at 11/03/2020 0929 Last data filed at 11/03/2020 0905 Gross per 24 hour  Intake 3882.08 ml  Output 3600 ml  Net 282.08 ml   Filed Weights   10/30/20 0500 10/31/20 0500 11/01/20 0438  Weight: 131 kg 130 kg 129.3 kg    Examination: General: Critically ill appearing man laying in bed in prone position, intubated, sedated, under NMB. HEENT: [place]/AT, eyes anicteric. Oral mucosa moist, lips and tongue edematous. PULM: Synchronous with MV, Pplat <24, DP 10 CV:  Regular rate and rhythm, NSR on tele. GI: obese, soft, NT MSK: BLE edema, no clubbing or cyanosis Derm: pallor, no rashes Neuro: RASS -5, pinpoint pupil on R.    Resolved Hospital Problem list   Septic shock due  to Stenotrophomonas pneumonia  Assessment & Plan:  ARDS due to COVID 19 pneumonia> worse due to aspiration episode on 12/5 and pneumonia. Today on  50%. No respiratory decompensation since vomiting episode on 12/8 thankfully. P:F 128 today. Lung compliance remains good. Stenotrophomonas pneumonia  High risk for acute PE -Con't LTVV, 4-8cc/kg IBW with goal Pplat<30 and DP<15. Permissive hypercania ok. Meeting pressure goals. -Titrate PEEP and FiO2 per ARDS protocol - can titrate down PEEP and FiO2 as saturations improve. -VAP prevention protocol -7 days of levofloxacin for Stenotrophomonas -con't escalated GNR coverage; doesn't need another dose of vancomycin today given renal function -prone 16 h/d until P:F>150; ABG post-supination this afternoon  -con't heavy sedation for now -con't steroids - prednisone 40mg  daily -trach aspirate pending; deescalate antibiotics as able -With only mildly elevated D-dimer and normal leg [place] and fairly normal RV on echo and alternative diagnosis to explain his decompensation on 12/11, will transition back to DVT prophylaxis with [place] heparin.  Severe ventilator dyssynchrony-- ok for now on heavy sedation; has caused issues leading to vomiting episodes previously Acute metabolic encephalopathy due to sedation -Continue enteral clonazepam & oxycodone -No longer requiring ketamine. -requiring NMB infusion to be restarted- nimbex given renal dysfunction -Continue dilaudid and versed drips -RASS target -4 to -5  Vomiting, suspect ileus -con't reglan until TF at goal -slowly advance TF and monitor residuals -Feeding through Panda tube, gastric tube to LIWS to monitor for TF intolerance.  AKI-stable. Not oliguric. Suspect ATN. Was transiently improving prior to worsening overnight on 12/11. Hyperkalemia- resolved Hyperphosphatemia due to AKI -Monitor BMET and UOP daily -lokelma twice daily for maintenance; had increased K+ when reduced to once daily -goal of euvolemia; autodiuresis has slowed -renally dose meds, avoid nephrotoxic meds  Hypernatremia - resolved -Holding free water via tube, continue D5  given vomiting until able to tolerate TFs given high enteral water requirements. Con't supplemental thiamine while D5W is on. -Holding diuretics  -con't to monitor renal function -strict I/Os  Hyperglycemia- controlled -Continue accuchecks Q4h with SSI, daily linagliptin -goal BG 140-180 -adding TF coverage 1 unit Q4h with hold parameters; con't low dose levemir to cover D5w   Nutrition -advance TF, monitor residuals -con't reglan  Diarrhea-resolved -Con't low-dose docusate -removed FMS previously -senna x 1 today since last BM charted 12/8  Acute anemia likely due to critical illness- remains stable -transfuse for Hb<7 or hemodynamically significant bleeding -con't to monitor  LLE edema- no DVT on 12/8 [place] -con't to monitor -autodiuresing   GoC discussion with significant other [name], who has been in touch with Brad's sister who we have been unable to contact. We discussed my significant concern about his renal failure and need to restart paralytic infusion today. At this point it looks like his setback overnight 2 nights ago was due to a new infectious process. Progressive renal failure in this context often carries a poor prognosis and RRT doesn't necessarily change the eventual outcome. This is an important time to consider where we are with his care before escalation further. He is at the point that he will need a trach in the next week given he has been intubated 13 days already and continues to have moderate to severe ARDS. Before consulting nephrology, [name] and his sister Neoma Laming will discuss their thoughts on his care. RN Zoe present during discussion.  Best practice (evaluated daily)   Diet: tube feeding- trickle, escalating Pain/Anxiety/Delirium protocol (if indicated): PAD protocol VAP protocol (if indicated): yes DVT prophylaxis: lovenox GI prophylaxis:  famotidine Glucose control: basal bolus Mobility: bed rest last date of multidisciplinary goals of care discussion:  12/12- updated s/o [name] Family and staff present [place] RN Summary of discussion reassessing goals, con't current care, will decide on RRT today/ tomorrow Follow up goals of care discussion due: next few days Code Status: full Disposition: remain in ICU  Labs   CBC: Recent Labs  Lab 10/28/20 0310 11/01/20 0500 11/02/20 0630 11/03/20 0420  WBC 8.6 12.3* 16.0* 19.0*  NEUTROABS 7.5  --   --   --   HGB 12.4* 11.5* 11.3* 11.8*  HCT 41.7 37.9* 36.6* 38.5*  MCV 111.8* 106.8* 106.1* 106.4*  PLT 231 199 208 253    Basic Metabolic Panel: Recent Labs  Lab 10/30/20 0818 10/31/20 0607 11/01/20 0500 11/02/20 0630 11/03/20 0420  NA 143 143 144 143 144  K 5.6* 5.1 5.2* 4.9 5.1  CL 111 113* 112* 111 110  CO2 23 23 23  21* 21*  GLUCOSE 168* 126* 119* 212* 130*  BUN 97* 93* 91* 111* 136*  CREATININE 1.72* 2.12* 1.81* 2.68* 3.20*  CALCIUM 8.6* 8.9 9.0 8.7* 8.7*  PHOS  --   --   --  6.4*  --    GFR: Estimated Creatinine Clearance: 35.4 mL/min (A) (by C-G formula based on SCr of 3.2 mg/dL (H)). Recent Labs  Lab 10/28/20 0310 11/01/20 0500 11/02/20 0630 11/03/20 0420  WBC 8.6 12.3* 16.0* 19.0*    Liver Function Tests: Recent Labs  Lab 10/28/20 0310  AST 36  ALT 55*  ALKPHOS 62  BILITOT 0.4  PROT 6.5  ALBUMIN 2.3*   No results for input(s): LIPASE, AMYLASE in the last 168 hours. No results for input(s): AMMONIA in the last 168 hours.  ABG    Component Value Date/Time   PHART 7.155 (LL) 11/02/2020 1640   PCO2ART 63.3 (H) 11/02/2020 1640   PO2ART 89.3 11/02/2020 1640   HCO3 21.4 11/02/2020 1640   ACIDBASEDEF 8.9 (H) 11/02/2020 1640   O2SAT 94.9 11/02/2020 1640     Coagulation Profile: Recent Labs  Lab 11/02/20 0956  INR 1.2    Cardiac Enzymes: No results for input(s): CKTOTAL, CKMB, CKMBINDEX, TROPONINI in the last 168 hours.  HbA1C: Hgb A1c MFr Bld  Date/Time Value Ref Range Status  10/22/2020 03:38 AM 5.3 4.8 - 5.6 % Final    Comment:    (NOTE)          Prediabetes: 5.7 - 6.4         Diabetes: >6.4         Glycemic control for adults with diabetes: <7.0     CBG: Recent Labs  Lab 11/02/20 1208 11/02/20 1600 11/02/20 1946 11/02/20 2336 11/03/20 0403  GLUCAP 121* 163* 140* 120* 122*     This patient is critically ill with multiple organ system failure which requires frequent high complexity decision making, assessment, support, evaluation, and titration of therapies. This was completed through the application of advanced monitoring technologies and extensive interpretation of multiple databases. During this encounter critical care time was devoted to patient care services described in this note for 46 minutes.  Julian Hy, DO 11/03/20 1:08 PM [facility]

## 2020-11-03 NOTE — Progress Notes (Signed)
[facility] Progress Note Patient Name: Jackson Figueroa DOB: [birth year] MRN: [id number]   Date of Service  11/03/2020  HPI/Events of Note  Patient with sub-optimal sedation and ventilator dyssynchrony despite maximum doses of Versed and Dilaudid. Patient is proned.  eICU Interventions  Precedex added to optimize sedation.        Jackson Figueroa 11/03/2020, 6:48 AM

## 2020-11-03 NOTE — Progress Notes (Signed)
Patient continues to stack breaths and overbreathe the ventilator despite dilaudid, versed, and vec PRN's administered. Versed and dilaudid drips maxed out. VSS and patient continues to oxygenate well. O2 sat in low to mid 90's. E-link notified. Awaiting orders.

## 2020-11-03 NOTE — Plan of Care (Signed)
Patient's friend Becky Sax is a Immunologist and has called to help get information to inform the patient's family. We discussed Mr. Duchene care up to this point, need for tracheostomy if we continue aggressive care significantly longer, potential need for RRT if he wishes to continue escalating care. He has weeks to months of critical illness ahead of him and he still has >50% chance of not surviving based on previous statistics with covid, likely higher based on recent experience. It is prudent to re-evaluate his and the family's goals and make sure that we are on the same page regarding expectations at this point before escalating care further. She will discuss this information with his sister and s/o [name] and the rest of the family. All questions have been answered and I have invited them to call back with additional questions if they arise.    Julian Hy, DO 11/03/20 2:44 PM [facility]

## 2020-11-03 NOTE — Progress Notes (Signed)
ANTICOAGULATION CONSULT NOTE - Follow Up Consult  Pharmacy Consult for Heparin IV Indication: suspected PE  No Known Allergies  Patient Measurements: Height: 6' (182.9 cm) Weight: 129.3 kg (285 lb 0.9 oz) IBW/kg (Calculated) : 77.6 Heparin Dosing Weight: 104 kg  Vital Signs: Temp: 97.88 F (36.6 C) (12/12 0700) Temp Source: Bladder (12/12 0700) Pulse Rate: 110 (12/12 0700)  Labs: Recent Labs    11/01/20 0500 11/02/20 0630 11/02/20 0956 11/02/20 1727 11/03/20 0420  HGB 11.5* 11.3*  --   --  11.8*  HCT 37.9* 36.6*  --   --  38.5*  PLT 199 208  --   --  214  APTT  --   --  33  --   --   LABPROT  --   --  14.9  --   --   INR  --   --  1.2  --   --   HEPARINUNFRC  --   --  0.10* 0.33 0.33  CREATININE 1.81* 2.68*  --   --  3.20*    Estimated Creatinine Clearance: 35.4 mL/min (A) (by C-G formula based on SCr of 3.2 mg/dL (H)).   Medications:  Infusions:  . sodium chloride 10 mL/hr at 11/03/20 0649  . ceFEPime (MAXIPIME) IV Stopped (11/03/20 0014)  . dexmedetomidine (PRECEDEX) IV infusion 0.4 mcg/kg/hr (11/03/20 0740)  . dextrose 100 mL/hr at 11/03/20 0000  . feeding supplement (PIVOT 1.5 CAL) 55 mL/hr at 11/03/20 0649  . heparin 2,000 Units/hr (11/03/20 0649)  . HYDROmorphone    . levofloxacin (LEVAQUIN) IV    . midazolam    . vecuronium (NORCURON) infusion 100mg /134mL (1 mg/mL) Stopped (11/01/20 0527)    Assessment: 76 yoM admitted with COVID pneumonia on 11/28.  He has been on prophylactic Lovenox 0.5 mg/kg daily (no missed doses).  Pharmacy is now consulted to change to Heparin for suspected PE with sudden onset tachycardia and hypoxia. - Last Lovenox 60mg  dose on 11/01/20 ~10 am.  Today, 11/03/2020: Heparin level remains therapeutic at 0.33 on Heparin 2000 units/hr CBC: Hgb low/stable at 11.8, Plt WNL - No ongoing bleeding or infusion problems reported by [facility] RN.  Last night there was small amount of frank blood from the OG immediately after he was  proned SCr acutely increased 1.8 > 2.68 > 3.2  Goal of Therapy:  Heparin level 0.3-0.7 units/ml Monitor platelets by anticoagulation protocol: Yes   Plan:  Continue heparin IV infusion at 2000 units/hr Daily heparin level and CBC Continue to monitor for s/s bleeding    Gretta Arab PharmD, BCPS Clinical Pharmacist [facility] main pharmacy [phone] 11/03/2020 8:13 AM

## 2020-11-04 LAB — URINE CULTURE: Culture: NO GROWTH

## 2020-11-04 LAB — BLOOD GAS, ARTERIAL
Acid-base deficit: 11.2 mmol/L — ABNORMAL HIGH (ref 0.0–2.0)
Bicarbonate: 19.7 mmol/L — ABNORMAL LOW (ref 20.0–28.0)
FIO2: 55
MECHVT: 540 mL
O2 Saturation: 98.9 %
PEEP: 14 cmH2O
Patient temperature: 98.6
RATE: 25 resp/min
pCO2 arterial: 73.6 mmHg (ref 32.0–48.0)
pH, Arterial: 7.056 — CL (ref 7.350–7.450)
pO2, Arterial: 171 mmHg — ABNORMAL HIGH (ref 83.0–108.0)

## 2020-11-04 LAB — CBC
HCT: 33.8 % — ABNORMAL LOW (ref 39.0–52.0)
Hemoglobin: 10.4 g/dL — ABNORMAL LOW (ref 13.0–17.0)
MCH: 33.2 pg (ref 26.0–34.0)
MCHC: 30.8 g/dL (ref 30.0–36.0)
MCV: 108 fL — ABNORMAL HIGH (ref 80.0–100.0)
Platelets: 169 10*3/uL (ref 150–400)
RBC: 3.13 MIL/uL — ABNORMAL LOW (ref 4.22–5.81)
RDW: 14.3 % (ref 11.5–15.5)
WBC: 11.4 10*3/uL — ABNORMAL HIGH (ref 4.0–10.5)
nRBC: 0 % (ref 0.0–0.2)

## 2020-11-04 LAB — BASIC METABOLIC PANEL
Anion gap: 14 (ref 5–15)
BUN: 150 mg/dL — ABNORMAL HIGH (ref 6–20)
CO2: 19 mmol/L — ABNORMAL LOW (ref 22–32)
Calcium: 8.2 mg/dL — ABNORMAL LOW (ref 8.9–10.3)
Chloride: 108 mmol/L (ref 98–111)
Creatinine, Ser: 3.53 mg/dL — ABNORMAL HIGH (ref 0.61–1.24)
GFR, Estimated: 19 mL/min — ABNORMAL LOW (ref >60)
Glucose, Bld: 108 mg/dL — ABNORMAL HIGH (ref 70–99)
Potassium: 5.3 mmol/L — ABNORMAL HIGH (ref 3.5–5.1)
Sodium: 141 mmol/L (ref 135–145)

## 2020-11-04 LAB — GLUCOSE, CAPILLARY
Glucose-Capillary: 125 mg/dL — ABNORMAL HIGH (ref 70–99)
Glucose-Capillary: 127 mg/dL — ABNORMAL HIGH (ref 70–99)
Glucose-Capillary: 109 mg/dL — ABNORMAL HIGH (ref 70–99)
Glucose-Capillary: 101 mg/dL — ABNORMAL HIGH (ref 70–99)
Glucose-Capillary: 155 mg/dL — ABNORMAL HIGH (ref 70–99)
Glucose-Capillary: 201 mg/dL — ABNORMAL HIGH (ref 70–99)
Glucose-Capillary: 153 mg/dL — ABNORMAL HIGH (ref 70–99)

## 2020-11-04 LAB — CYTOLOGY - NON PAP

## 2020-11-04 MED ORDER — HYDROMORPHONE HCL 1 MG/ML PO LIQD
3.0000 mg | ORAL | Status: DC
  Administered 2020-11-04 – 2020-11-14 (×61): 3 mg via ORAL
  Filled 2020-11-04 (×60): qty 3

## 2020-11-04 MED ORDER — ARTIFICIAL TEARS OPHTHALMIC OINT
1.0000 "application " | TOPICAL_OINTMENT | Freq: Three times a day (TID) | OPHTHALMIC | Status: DC
  Administered 2020-11-04 – 2020-11-05 (×3): 1 via OPHTHALMIC
  Filled 2020-11-04 (×2): qty 3.5

## 2020-11-04 MED ORDER — CISATRACURIUM BESYLATE (PF) 10 MG/5ML IV SOLN
0.1000 mg/kg | INTRAVENOUS | Status: DC | PRN
  Administered 2020-11-04 – 2020-11-05 (×2): 13 mg via INTRAVENOUS
  Filled 2020-11-04 (×5): qty 6.5

## 2020-11-04 MED ORDER — PREDNISONE 5 MG/ML PO CONC
20.0000 mg | Freq: Every day | ORAL | Status: AC
  Administered 2020-11-05 – 2020-11-07 (×3): 20 mg
  Filled 2020-11-04 (×3): qty 4

## 2020-11-04 NOTE — Progress Notes (Signed)
[facility]  Goals of care conversation with his long time girlfriend today.  Sister could not be contacted by phone.  I explained that his chances of survival are slim, and attempts to get him to hospital discharge will take weeks to months of inpatient care with multiple forms of life support.  This will come at the expense of significant pain and suffering.  She says they have never discussed goals of care but she feels that he would want to "try everything".    I asked her to contact the patient's sister so we could bring her into the conversation.  Will consult palliative care to continue these conversations.  Will consult nephrology given the fact that he will likely need CRRT in the next several days if we are continuing with a full scope of practice.  From my standpoint survival is possible, albeit very unlikely.    Roselie Awkward, MD [facility] Pager: [phone] Cell: [phone] If no response, call [phone]

## 2020-11-04 NOTE — Progress Notes (Signed)
NAME:  Jackson Figueroa, MRN:  [id number], DOB:  [birth year], LOS: 6 ADMISSION DATE:  10/20/2020, CONSULTATION DATE:  11/29 REFERRING MD:  Sabra Heck, CHIEF COMPLAINT:  ARDS COVID 23   Brief History   59 year old male with no significant past medical history and a positive COVID-19 test on 11/26 presented to [facility] with profound hypoxemia on 11/28.  Intubated immediately upon arrival found to be in ARDS.  Transferred to [facility] for ongoing ICU care in the early a.m. hours of 11/29.  Past Medical History  obesity  Significant Hospital Events   11/28 [facility] emergency department where he was promptly intubated for hypoxemia and airway protection.  In the emergency department he was started on empiric antibiotics and developed shock requiring norepinephrine infusion.  He was transferred to [facility] for ICU admission.  Therapeutics for COVID-19 initiated include dexamethasone and remdesivir. 11/29 shock state persisted. Central line placed, ARDS protocol continued, proning initiated 11/30: Pressor requirements continued, neuromuscular blockade initiated to facilitate ventilation tube feeds started, initiated proning 12/1: Stopping neuromuscular blockade, proning positioning protocol discontinued, added free water for rising creatinine and sodium 12/2: PEEP trending down, maintaining pulse oximetry.  Renal function improved. Changed to versed from prop gtt d/t rising triglycerides  12/3 Remdesivir now completed. Peep/FIO2 stable. Free water adjusted. Solumedrol changed to pred 40mg , changing fentanyl to Dilaudid, adding Oxycodone  12/4-5 continued dyssynchrony, given paralytic overnight, changed to ketamine; vomited and aspirated, restarted paralytics 12/6: proned, under NMB 12/7: proning, NMB continued. Switched antibiotics for stenotrophomonas  Consults:    Procedures:  ETT 11/28 > CVL 11/29 >  Significant Diagnostic Tests:  Echo 11/29 > LVEF 50-55%, mild  LVH, RV systolic function mildly reduced. Mild dilation of ascending aorta 42mm. 12/8 Lower extre doppler ultrasound > negative for DVT  Micro Data:  Blood 11/29 > Urine 11/28 >  SARS COV2/Flu > negative  12/5 resp> few GPC> stenotrophomonas> sensitive to Bactrim & levofloxacin  12/11 respiratory> stenotrophamonas 12/11 blood> 12/11 urine> negative  Antimicrobials:  CTX 11/28 > 11/29 Azithromycin 11/28 > 11/29  remdesivir 11/28 > 12/2 Prednisone 11/28 >  baricitinib 12/4 > 12/5   12/5 vanc > 12/7, restarted 12/11 x1 12/5 cefepime > 12/7, restarted 12/11 12/7 levofloxacin>   Interim history/subjective:   BIS up Still making urine Renal function worse Diarrhea again   Objective   Blood pressure (!) 108/53, pulse (!) 111, temperature (!) 97.34 F (36.3 C), resp. rate (!) 25, height 6' (1.829 m), weight 129.3 kg, SpO2 96 %.    Vent Mode: PRVC FiO2 (%):  [55 %-70 %] 55 % Set Rate:  [25 bmp] 25 bmp Vt Set:  [540 mL] 540 mL PEEP:  [14 cmH20] 14 cmH20 Plateau Pressure:  [14 cmH20-28 cmH20] 26 cmH20   Intake/Output Summary (Last 24 hours) at 11/04/2020 0948 Last data filed at 11/04/2020 0800 Gross per 24 hour  Intake 4501.49 ml  Output 2375 ml  Net 2126.49 ml   Filed Weights   10/30/20 0500 10/31/20 0500 11/01/20 0438  Weight: 131 kg 130 kg 129.3 kg    Examination:  General:  In bed on vent HENT: NCAT ETT in place PULM: Crackles B, vent supported breathing CV: RRR, no mgr GI: BS+, soft, nontender MSK: normal bulk and tone Neuro: sedated on vent  12/5 CXR images reviewed> bibasilar air space disease, slightly worse in appearance compared to 12/4  Operating Room Services Problem list     Assessment & Plan:  ARDS due to COVID  19 pneumonia> worse after aspiration on 12/5 Continue mechanical ventilation per ARDS protocol Target TVol 6-8cc/kgIBW Target Plateau Pressure < 30cm H20 Target driving pressure less than 15 cm of water Target PaO2 55-65: titrate  PEEP/FiO2 per protocol As long as PaO2 to FiO2 ratio is less than 1:150 position in prone position for 16 hours a day Check CVP daily if CVL in place Target CVP less than 4, diurese as necessary Ventilator associated pneumonia prevention protocol 12/13 check ABG, consider holding prone  Stenotrophomonas pneumonia, persistent on repeat culture Has been treated with multiple antibiotics this week Not clearly improving ID consult Continue cefepime/levaquin for now  Severe ventilator dyssynchrony Acute metabolic encephalopathy RASS goal -4 to -5 Change nimbex to prn Change oxycodone to dilaudid oral Continue versed infusion Continue dilaudid infusion Continue clonazepam  Vomiting> ileus NG to LWS Continue post pyloric feedings, consider increasing rate over next few days  Hyperglycemia Continue glargine, ssi, linagliptin  Nutrition Continue tube feeding  AKI > worsening Monitor BMET and UOP Replace electrolytes as needed Will discuss CRRT option with family today  Diarrhea Monitor for fever, WBC count    Best practice (evaluated daily)   Diet: tube feeding Pain/Anxiety/Delirium protocol (if indicated): as above VAP protocol (if indicated): yes DVT prophylaxis: lovenox GI prophylaxis: famotidine Glucose control: as above Mobility: bed rest last date of multidisciplinary goals of care discussion 12/12 with Dr. Carlis Abbott; I called his girlfriend on 12/13 to follow up but unfortunately I had to leave a detailed message. I also called his sister Neoma Laming but was unable to reach her either.  Voicemail not set up.  Family and staff present n/a Summary of discussion n/a Follow up goals of care discussion due 12/14 Code Status: full Disposition: remain in ICU  Labs   CBC: Recent Labs  Lab 11/01/20 0500 11/02/20 0630 11/03/20 0420 11/04/20 0600  WBC 12.3* 16.0* 19.0* 11.4*  HGB 11.5* 11.3* 11.8* 10.4*  HCT 37.9* 36.6* 38.5* 33.8*  MCV 106.8* 106.1* 106.4* 108.0*   PLT 199 208 214 174    Basic Metabolic Panel: Recent Labs  Lab 10/31/20 0607 11/01/20 0500 11/02/20 0630 11/03/20 0420 11/04/20 0600  NA 143 144 143 144 141  K 5.1 5.2* 4.9 5.1 5.3*  CL 113* 112* 111 110 108  CO2 23 23 21* 21* 19*  GLUCOSE 126* 119* 212* 130* 108*  BUN 93* 91* 111* 136* 150*  CREATININE 2.12* 1.81* 2.68* 3.20* 3.53*  CALCIUM 8.9 9.0 8.7* 8.7* 8.2*  PHOS  --   --  6.4*  --   --    GFR: Estimated Creatinine Clearance: 32.1 mL/min (A) (by C-G formula based on SCr of 3.53 mg/dL (H)). Recent Labs  Lab 11/01/20 0500 11/02/20 0630 11/03/20 0420 11/04/20 0600  WBC 12.3* 16.0* 19.0* 11.4*    Liver Function Tests: No results for input(s): AST, ALT, ALKPHOS, BILITOT, PROT, ALBUMIN in the last 168 hours. No results for input(s): LIPASE, AMYLASE in the last 168 hours. No results for input(s): AMMONIA in the last 168 hours.  ABG    Component Value Date/Time   PHART 7.155 (LL) 11/02/2020 1640   PCO2ART 63.3 (H) 11/02/2020 1640   PO2ART 89.3 11/02/2020 1640   HCO3 21.4 11/02/2020 1640   ACIDBASEDEF 8.9 (H) 11/02/2020 1640   O2SAT 94.9 11/02/2020 1640     Coagulation Profile: Recent Labs  Lab 11/02/20 0956  INR 1.2    Cardiac Enzymes: No results for input(s): CKTOTAL, CKMB, CKMBINDEX, TROPONINI in the last 168 hours.  HbA1C: Hgb A1c MFr Bld  Date/Time Value Ref Range Status  10/22/2020 03:38 AM 5.3 4.8 - 5.6 % Final    Comment:    (NOTE)         Prediabetes: 5.7 - 6.4         Diabetes: >6.4         Glycemic control for adults with diabetes: <7.0     CBG: Recent Labs  Lab 11/03/20 1613 11/03/20 2003 11/04/20 0112 11/04/20 0406 11/04/20 0812  GLUCAP 153* 167* 127* 109* 101*     Critical care time: 40 minutes    Roselie Awkward, MD [facility] Pager: [phone] Cell: [phone] If no response, call [phone]

## 2020-11-04 NOTE — Progress Notes (Signed)
[facility] Progress Note Patient Name: Jackson Figueroa DOB: November 06, 1961 MRN: [id number]   Date of Service  11/04/2020  HPI/Events of Note  Multiple loose, watery stools, patient proned. Nursing request for Flexiseal.   eICU Interventions  Plan: 1. Place Flexiseal.      Intervention Category Major Interventions: Other:  Berenize Gatlin Cornelia Copa 11/04/2020, 3:13 AM

## 2020-11-04 NOTE — TOC Progression Note (Signed)
Transition of Care [facility]) - Progression Note    Patient Details  Name: Jackson Figueroa MRN: [id number] Date of Birth: 09-05-1961  Transition of Care [facility]) CM/SW Contact  Leeroy Cha, RN Phone Number: 11/04/2020, 10:43 AM  Clinical Narrative:    [facility] Events   11/28 [facility] emergency department where he was promptly intubated for hypoxemia and airway protection. In the emergency department he was started on empiric antibiotics and developed shock requiring norepinephrine infusion. He was transferred to [facility] for ICU admission. Therapeutics for COVID-19 initiated include dexamethasone and remdesivir. 11/29 shock state persisted. Central line placed, ARDS protocol continued, proning initiated 11/30: Pressor requirements continued, neuromuscular blockade initiated to facilitate ventilation tube feeds started, initiated proning 12/1: Stopping neuromuscular blockade, proning positioning protocol discontinued, added free water for rising creatinine and sodium 12/2: PEEP trending down, maintaining pulse oximetry. Renal function improved. Changed to versed from prop gtt d/t rising triglycerides  12/3 Remdesivir now completed. Peep/FIO2 stable. Free water adjusted. Solumedrol changed to pred 40mg , changing fentanyl to Dilaudid, adding Oxycodone 12/4-5 continued dyssynchrony, given paralytic overnight, changed to ketamine, started baricitinib. Vomited and aspirated> restarted paralytics, escalated antibiotics 12/6: proned, under NMB 12/7: proning, NMB continued. Switched antibiotics for stenotrophomonas 12/8:  Note concerning future plan of care: Patient's friend Becky Sax is a Immunologist and has called to help get information to inform the patient's family. We discussed Mr. Koelzer care up to this point, need for tracheostomy if we continue aggressive care significantly longer, potential need for RRT if he wishes to continue escalating care. He has weeks to months of  critical illness ahead of him and he still has >50% chance of not surviving based on previous statistics with covid, likely higher based on recent experience. It is prudent to re-evaluate his and the family's goals and make sure that we are on the same page regarding expectations at this point before escalating care further. She will discuss this information with his sister and s/o [name] and the rest of the family. All questions have been answered and I have invited them to call back with additional questions if they arise.    Expected Discharge Plan: Home/Self Care    Expected Discharge Plan and Services Expected Discharge Plan: Home/Self Care   Discharge Planning Services: CM Consult   Living arrangements for the past 2 months: Single Family Home                                       Social Determinants of Health (SDOH) Interventions    Readmission Risk Interventions No flowsheet data found.

## 2020-11-04 NOTE — Consult Note (Signed)
[place] for Infectious Disease       Reason for Consult: ? pneumonia    Referring Physician: Dr. Lake Bells  Active Problems:   Acute respiratory failure due to COVID-19 [facility])   Acute respiratory distress syndrome (ARDS) due to COVID-19 virus ([place])   Septic shock ([place])   . artificial tears  1 application Both Eyes N8G  . chlorhexidine gluconate (MEDLINE KIT)  15 mL Mouth Rinse BID  . Chlorhexidine Gluconate Cloth  6 each Topical Q0600  . clonazePAM  2 mg Per Tube Q8H  . docusate  100 mg Per Tube Daily  . famotidine  20 mg Per Tube BID  . feeding supplement (PROSource TF)  90 mL Per Tube QID  . heparin injection (subcutaneous)  7,500 Units Subcutaneous Q8H  . HYDROmorphone HCl  3 mg Oral Q4H  . insulin aspart  0-15 Units Subcutaneous Q4H  . insulin aspart  1 Units Subcutaneous Q4H  . insulin glargine  5 Units Subcutaneous Daily  . linagliptin  5 mg Per Tube Daily  . mouth rinse  15 mL Mouth Rinse 10 times per day  . metoCLOPramide (REGLAN) injection  10 mg Intravenous Q12H  . multivitamin  15 mL Per Tube Daily  . [START ON 11/05/2020] predniSONE  20 mg Per Tube Q breakfast  . sodium chloride flush  10-40 mL Intracatheter Q12H  . sodium zirconium cyclosilicate  10 g Per Tube BID  . thiamine  100 mg Per Tube Daily    Recommendations: Continue levaquin one more dose Stop cefepime   Assessment:  he has respiratory failure and a trach aspirate with Stenotrophomonas of unclear significance.  His CXR shows persistent signs and his vent setting are c/w ARDS.  Certainly pneumonia is possible and levaquin sensitive (pending new sensi) so I do not antipate a failure of therapy.  At this time, will continue with the levaquin for one more dose (9 days total) and stop.  No indication at this time for cefepime so will stop this.    Antibiotics: Cefepime and levaquin  HPI: Rohn Fritsch is a 59 y.o. male with morbid obesity, COVID positive pneumonia with aspiration 12/5 with a  positive Stenotrophomonas culture in the trach aspirate.  He has been afebrile since 12/10 and WBC 11.4.  He is presently intubated and prone.  No history obtainable from the patient. He [facility] presented to [facility] and was intubated at that time.  CXR with bilateral multifocal pneumonia.  ARDS protocol for the vent.    Review of Systems:  Unable to be assessed due to patient factors  Past Medical History:  Diagnosis Date  . Obesity     Social History   Tobacco Use  . Smoking status: Never Smoker  . Smokeless tobacco: Never Used  Vaping Use  . Vaping Use: Never used  Substance Use Topics  . Alcohol use: Yes    Comment: occasional wine  . Drug use: Never    History reviewed. No pertinent family history.  Per chart review, unobtainable from the patient  No Known Allergies  Physical Exam: Constitutional: sedated Vitals:   11/04/20 1200 11/04/20 1300  BP: 130/60   Pulse: (!) 105 (!) 106  Resp: (!) 25 (!) 25  Temp: (!) 96.44 F (35.8 C) (!) 96.44 F (35.8 C)  SpO2: 99% 100%   EYES: anicteric ENMT: +ET Respiratory: respirator effort on vent; CTA GI: obese, soft Musculoskeletal: + bilateral lower extremity edema noted  Skin: negatives: no rash Neuro: sedated,  paralized  Lab Results  Component Value Date   WBC 11.4 (H) 11/04/2020   HGB 10.4 (L) 11/04/2020   HCT 33.8 (L) 11/04/2020   MCV 108.0 (H) 11/04/2020   PLT 169 11/04/2020    Lab Results  Component Value Date   CREATININE 3.53 (H) 11/04/2020   BUN 150 (H) 11/04/2020   NA 141 11/04/2020   K 5.3 (H) 11/04/2020   CL 108 11/04/2020   CO2 19 (L) 11/04/2020    Lab Results  Component Value Date   ALT 55 (H) 10/28/2020   AST 36 10/28/2020   ALKPHOS 62 10/28/2020     Microbiology: Recent Results (from the past 240 hour(s))  Culture, respiratory (non-expectorated)     Status: None   Collection Time: 10/27/20  9:06 AM   Specimen: Tracheal Aspirate; Respiratory  Result Value Ref Range Status    Specimen Description   Final    TRACHEAL ASPIRATE Performed at [facility] [address], [place], [place] [address]    Special Requests   Final    NONE Performed at [facility], [place] [address] [place], [place] [address]    Gram Stain   Final    NO WBC SEEN MODERATE GRAM VARIABLE ROD FEW GRAM POSITIVE COCCI IN PAIRS Performed at [facility], [place] [address], [place], [place] [address]    Culture ABUNDANT STENOTROPHOMONAS MALTOPHILIA  Final   Report Status 10/31/2020 FINAL  Final   Organism ID, Bacteria STENOTROPHOMONAS MALTOPHILIA  Final      Susceptibility   Stenotrophomonas maltophilia - MIC*    LEVOFLOXACIN 1 SENSITIVE Sensitive     TRIMETH/SULFA <=20 SENSITIVE Sensitive     * ABUNDANT STENOTROPHOMONAS MALTOPHILIA  MRSA PCR Screening     Status: None   Collection Time: 10/28/20 12:07 PM   Specimen: Nasal Mucosa; Nasopharyngeal  Result Value Ref Range Status   MRSA by PCR NEGATIVE NEGATIVE Final    Comment:        The GeneXpert MRSA Assay (FDA approved for NASAL specimens only), is one component of a comprehensive MRSA colonization surveillance program. It is not intended to diagnose MRSA infection nor to guide or monitor treatment for MRSA infections. Performed at [facility], [place] [address], [place], [place] [address]   Culture, respiratory (non-expectorated)     Status: None (Preliminary result)   Collection Time: 11/02/20  7:32 AM   Specimen: Tracheal Aspirate; Respiratory  Result Value Ref Range Status   Specimen Description   Final    TRACHEAL ASPIRATE Performed at [facility] [address], [place], [place] [address]    Special Requests   Final    NONE Performed at [facility], [place] [address], [place], [place] [address]    Gram Stain   Final    ABUNDANT WBC PRESENT, PREDOMINANTLY PMN MODERATE GRAM NEGATIVE RODS RARE GRAM POSITIVE COCCI     Culture   Final    MODERATE STENOTROPHOMONAS MALTOPHILIA SUSCEPTIBILITIES TO FOLLOW Performed at [facility], [place] [address], [place], [place] [address]    Report Status PENDING  Incomplete  Culture, blood (routine x 2)     Status: None (Preliminary result)   Collection Time: 11/02/20  8:14 AM   Specimen: BLOOD  Result Value Ref Range Status   Specimen Description   Final    BLOOD LEFT ANTECUBITAL Performed at [facility] [address] [place], [place] [address]  Special Requests   Final    BOTTLES DRAWN AEROBIC ONLY Blood Culture adequate volume Performed at [facility] [address] [place] [address], [place], [place] [address]    Culture   Final    NO GROWTH 2 DAYS Performed at [facility] [address], [place], [place] [address]    Report Status PENDING  Incomplete  Culture, blood (routine x 2)     Status: None (Preliminary result)   Collection Time: 11/02/20  8:14 AM   Specimen: BLOOD  Result Value Ref Range Status   Specimen Description   Final    BLOOD BLOOD LEFT FOREARM Performed at [facility] [address] [place], [place] [address]    Special Requests   Final    BOTTLES DRAWN AEROBIC ONLY Blood Culture adequate volume Performed at [facility] [address], [place], [place] [address]    Culture   Final    NO GROWTH 2 DAYS Performed at [facility] [address] [place], [place] [address]    Report Status PENDING  Incomplete  MRSA PCR Screening     Status: None   Collection Time: 11/02/20  9:56 AM   Specimen: Nasopharyngeal  Result Value Ref Range Status   MRSA by PCR NEGATIVE NEGATIVE Final    Comment:        The GeneXpert MRSA Assay (FDA approved for NASAL specimens only), is one component of a comprehensive MRSA colonization surveillance program. It is not intended to diagnose MRSA infection nor to guide or monitor treatment for MRSA  infections. Performed at [facility], [place] [address], [place], [place] [address]   Culture, Urine     Status: None   Collection Time: 11/02/20 10:54 AM   Specimen: Urine, Random  Result Value Ref Range Status   Specimen Description   Final    URINE, RANDOM Performed at [facility] [address] [place], [place] [address]    Special Requests   Final    NONE Performed at [facility], [place] [address], [place], [place] [address]    Culture   Final    NO GROWTH Performed at [facility], [place] [address] [place], [place] [address]    Report Status 11/04/2020 FINAL  Final    Thayer Headings, [place] for Infectious Disease [facility] [email or web address] 11/04/2020, 1:44 PM

## 2020-11-04 NOTE — Progress Notes (Signed)
NUTRITION NOTE  Last full assessment by RD was done on 12/9. Patient remains intubated. He has OGT to LIS and small bore NGT placed 12/9 and abdominal x-ray report from that date indicates that tip of tube was at the gastric antrum/pylorus.  Patient continues to receive Pivot 1.5 @ 10 ml/hr with 90 ml Prosource TF QID. This regimen is providing 640 kcal, 99 grams protein, and 182 ml free water.   Goal rate for TF regimen: Pivot 1.5 @ 65 ml/hr with 90 ml Prosource TF QID. This regimen will provide 2620 kcal, 223 grams protein, and 1184 ml free water.   Able to talk with Dr. Lake Bells who confirms that TF remains at 10 ml/hr pending further discussions with family about [name]. Able to talk with RN.   RD will continue to follow per protocol.        Jackson Matin, MS, RD, LDN, CNSC Inpatient Clinical Dietitian RD pager # available in [place]  After hours/weekend pager # available in [facility]

## 2020-11-05 ENCOUNTER — Inpatient Hospital Stay (HOSPITAL_COMMUNITY): Payer: BC Managed Care – PPO

## 2020-11-05 ENCOUNTER — Encounter (HOSPITAL_COMMUNITY): Payer: Self-pay | Admitting: Specialist

## 2020-11-05 LAB — CBC WITH DIFFERENTIAL/PLATELET
Abs Immature Granulocytes: 0.51 10*3/uL — ABNORMAL HIGH (ref 0.00–0.07)
Basophils Absolute: 0 10*3/uL (ref 0.0–0.1)
Basophils Relative: 0 %
Eosinophils Absolute: 0 10*3/uL (ref 0.0–0.5)
Eosinophils Relative: 1 %
HCT: 30.3 % — ABNORMAL LOW (ref 39.0–52.0)
Hemoglobin: 9.4 g/dL — ABNORMAL LOW (ref 13.0–17.0)
Immature Granulocytes: 6 %
Lymphocytes Relative: 6 %
Lymphs Abs: 0.5 10*3/uL — ABNORMAL LOW (ref 0.7–4.0)
MCH: 32.2 pg (ref 26.0–34.0)
MCHC: 31 g/dL (ref 30.0–36.0)
MCV: 103.8 fL — ABNORMAL HIGH (ref 80.0–100.0)
Monocytes Absolute: 0.8 10*3/uL (ref 0.1–1.0)
Monocytes Relative: 10 %
Neutro Abs: 6.7 10*3/uL (ref 1.7–7.7)
Neutrophils Relative %: 77 %
Platelets: 144 10*3/uL — ABNORMAL LOW (ref 150–400)
RBC: 2.92 MIL/uL — ABNORMAL LOW (ref 4.22–5.81)
RDW: 14.2 % (ref 11.5–15.5)
WBC: 8.6 10*3/uL (ref 4.0–10.5)
nRBC: 0 % (ref 0.0–0.2)

## 2020-11-05 LAB — COMPREHENSIVE METABOLIC PANEL
ALT: 29 U/L (ref 0–44)
AST: 18 U/L (ref 15–41)
Albumin: 1.9 g/dL — ABNORMAL LOW (ref 3.5–5.0)
Alkaline Phosphatase: 60 U/L (ref 38–126)
Anion gap: 13 (ref 5–15)
BUN: 152 mg/dL — ABNORMAL HIGH (ref 6–20)
CO2: 18 mmol/L — ABNORMAL LOW (ref 22–32)
Calcium: 8.1 mg/dL — ABNORMAL LOW (ref 8.9–10.3)
Chloride: 109 mmol/L (ref 98–111)
Creatinine, Ser: 4.14 mg/dL — ABNORMAL HIGH (ref 0.61–1.24)
GFR, Estimated: 16 mL/min — ABNORMAL LOW (ref >60)
Glucose, Bld: 113 mg/dL — ABNORMAL HIGH (ref 70–99)
Potassium: 4.3 mmol/L (ref 3.5–5.1)
Sodium: 140 mmol/L (ref 135–145)
Total Bilirubin: 0.6 mg/dL (ref 0.3–1.2)
Total Protein: 5.9 g/dL — ABNORMAL LOW (ref 6.5–8.1)

## 2020-11-05 LAB — CULTURE, RESPIRATORY W GRAM STAIN

## 2020-11-05 LAB — GLUCOSE, CAPILLARY
Glucose-Capillary: 104 mg/dL — ABNORMAL HIGH (ref 70–99)
Glucose-Capillary: 107 mg/dL — ABNORMAL HIGH (ref 70–99)
Glucose-Capillary: 121 mg/dL — ABNORMAL HIGH (ref 70–99)
Glucose-Capillary: 124 mg/dL — ABNORMAL HIGH (ref 70–99)
Glucose-Capillary: 94 mg/dL (ref 70–99)
Glucose-Capillary: 97 mg/dL (ref 70–99)

## 2020-11-05 LAB — BLOOD GAS, ARTERIAL
Acid-base deficit: 8.1 mmol/L — ABNORMAL HIGH (ref 0.0–2.0)
Bicarbonate: 18.3 mmol/L — ABNORMAL LOW (ref 20.0–28.0)
Drawn by: 441261
FIO2: 40
O2 Saturation: 96.6 %
PEEP: 8 cmH2O
PIP: 16 cmH2O
Patient temperature: 36.1
RATE: 15 resp/min
pCO2 arterial: 42.5 mmHg (ref 32.0–48.0)
pH, Arterial: 7.252 — ABNORMAL LOW (ref 7.350–7.450)
pO2, Arterial: 87.3 mmHg (ref 83.0–108.0)

## 2020-11-05 LAB — SODIUM, URINE, RANDOM: Sodium, Ur: 19 mmol/L

## 2020-11-05 LAB — CREATININE, URINE, RANDOM: Creatinine, Urine: 49.41 mg/dL

## 2020-11-05 MED ORDER — POLYETHYLENE GLYCOL 3350 17 G PO PACK
17.0000 g | PACK | Freq: Every day | ORAL | Status: DC
  Administered 2020-11-06 – 2020-11-10 (×4): 17 g
  Filled 2020-11-05 (×4): qty 1

## 2020-11-05 MED ORDER — MIDAZOLAM BOLUS VIA INFUSION
1.0000 mg | INTRAVENOUS | Status: DC | PRN
  Administered 2020-11-05 – 2020-11-07 (×2): 2 mg via INTRAVENOUS
  Filled 2020-11-05: qty 2

## 2020-11-05 MED ORDER — PIVOT 1.5 CAL PO LIQD
1000.0000 mL | ORAL | Status: DC
  Administered 2020-11-05 – 2020-11-08 (×3): 1000 mL
  Filled 2020-11-05 (×4): qty 1000

## 2020-11-05 MED ORDER — FAMOTIDINE 40 MG/5ML PO SUSR
20.0000 mg | Freq: Every day | ORAL | Status: DC
  Administered 2020-11-06 – 2020-11-14 (×9): 20 mg
  Filled 2020-11-05 (×9): qty 2.5

## 2020-11-05 MED ORDER — ORAL CARE MOUTH RINSE
15.0000 mL | OROMUCOSAL | Status: DC
  Administered 2020-11-06 – 2020-11-14 (×84): 15 mL via OROMUCOSAL

## 2020-11-05 MED ORDER — MIDAZOLAM 50MG/50ML (1MG/ML) PREMIX INFUSION
0.5000 mg/h | INTRAVENOUS | Status: DC
  Administered 2020-11-05 – 2020-11-06 (×4): 10 mg/h via INTRAVENOUS
  Administered 2020-11-06: 16:00:00 6 mg/h via INTRAVENOUS
  Administered 2020-11-06: 03:00:00 10 mg/h via INTRAVENOUS
  Administered 2020-11-06: 23:00:00 7 mg/h via INTRAVENOUS
  Administered 2020-11-07 (×3): 8 mg/h via INTRAVENOUS
  Administered 2020-11-08: 10:00:00 4 mg/h via INTRAVENOUS
  Administered 2020-11-08: 21:00:00 6 mg/h via INTRAVENOUS
  Administered 2020-11-08: 7 mg/h via INTRAVENOUS
  Administered 2020-11-09: 03:00:00 6 mg/h via INTRAVENOUS
  Administered 2020-11-09: 19:00:00 4 mg/h via INTRAVENOUS
  Filled 2020-11-05 (×14): qty 50

## 2020-11-05 MED ORDER — SODIUM CHLORIDE 0.9 % IV SOLN
0.5000 mg/h | INTRAVENOUS | Status: DC
  Administered 2020-11-05 – 2020-11-09 (×7): 4 mg/h via INTRAVENOUS
  Administered 2020-11-09: 19:00:00 3 mg/h via INTRAVENOUS
  Filled 2020-11-05 (×12): qty 5

## 2020-11-05 MED ORDER — CHLORHEXIDINE GLUCONATE 0.12% ORAL RINSE (MEDLINE KIT)
15.0000 mL | Freq: Two times a day (BID) | OROMUCOSAL | Status: DC
  Administered 2020-11-05 – 2020-11-13 (×17): 15 mL via OROMUCOSAL

## 2020-11-05 MED ORDER — HYDROMORPHONE BOLUS VIA INFUSION
0.5000 mg | INTRAVENOUS | Status: DC | PRN
  Administered 2020-11-05 – 2020-11-09 (×8): 0.5 mg via INTRAVENOUS
  Filled 2020-11-05: qty 1

## 2020-11-05 MED ORDER — DOCUSATE SODIUM 50 MG/5ML PO LIQD
100.0000 mg | Freq: Two times a day (BID) | ORAL | Status: DC
  Administered 2020-11-05 – 2020-11-08 (×6): 100 mg
  Filled 2020-11-05 (×6): qty 10

## 2020-11-05 MED ORDER — HYDROMORPHONE HCL 1 MG/ML IJ SOLN
1.0000 mg | Freq: Once | INTRAMUSCULAR | Status: DC
Start: 2020-11-05 — End: 2020-11-06

## 2020-11-05 NOTE — Consult Note (Signed)
Palliative care consult note  Reason for consult: Goals of care in light of COVID-19 infection with multisystem organ failure  Palliative care consult received.  Chart reviewed including personal review of pertinent labs and imaging.  Briefly, Mr. Jackson Figueroa is a 59 year old male with no significant past medical history who presented to [facility] with COVID-19 infection and subsequent ARDS requiring intubation.  He was transferred to [facility] for continuing ICU level support.  He developed shock requiring pressor support and completed course of remdesivir.  He was continued on proning protocol.  He had episode of emesis and concern for aspiration.  Antibiotics were transition to coverage for stenotrophomonas with ID guidance.  He has also developed nonoliguric AKI with concern for need for CRRT.  Palliative care consult for goals of care.  Mr. Jackson Figueroa is intubated and sedated unable to participate in goals of care conversation.  I first attempted to call his sister, Jackson Figueroa to initiate conversation regarding goals of care.  I was unable to reach her in the option to leave voicemail.  I also called and was able to reach his long-term significant other, Jackson Figueroa.  Jackson Figueroa tells me that she has been in contact both with Jackson Figueroa care team as well as his sister.  We discussed things most important to Jackson Figueroa.  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  They have been together for 12 years during which time she became disabled and he is continue to take care of her.  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  Jackson Figueroa tells me that she just purchased a new home that they moved into 3 weeks ago.  She becomes tearful as this has been dream of theirs and now it is a constant reminder that he is not there with her.  She is worried they will lose the house as he is  unable to work currently.  She tells me that Jackson Figueroa is, "a Nurse, adult" and would want to pursue any options that may add time to his life.    Jackson Figueroa tells me she has also been in discussion with patient's sister, Jackson Figueroa, who feels the same way.  Family would like time to discuss further but overall it appears that they believe Jackson Figueroa would want to continue with any and all offered interventions.  -Full code/full scope -I spoke with patient's significant other of 12 years today.  She believes Jackson Figueroa would want to continue with any and all aggressive interventions. -I also attempted to call patient's sister but was unable to reach her today.  I am planning to reach out again to her tomorrow to continue discussion regarding long-term goals of care.  Start time: 1520 End time: 1620 Total time: 60 minutes  Greater than 50%  of this time was spent counseling and coordinating care related to the above assessment and plan.  Micheline Rough, MD [place] Team [phone]

## 2020-11-05 NOTE — Plan of Care (Signed)
Care plan reviewed.  Problem: Education: Goal: Knowledge of General Education information will improve Description: Including pain rating scale, medication(s)/side effects and non-pharmacologic comfort measures Outcome: Progressing   Problem: Health Behavior/Discharge Planning: Goal: Ability to manage health-related needs will improve Outcome: Progressing   Problem: Clinical Measurements: Goal: Ability to maintain clinical measurements within normal limits will improve Outcome: Progressing Goal: Will remain free from infection Outcome: Progressing Goal: Diagnostic test results will improve Outcome: Progressing Goal: Respiratory complications will improve Outcome: Progressing Goal: Cardiovascular complication will be avoided Outcome: Progressing   Problem: Activity: Goal: Risk for activity intolerance will decrease Outcome: Progressing   Problem: Nutrition: Goal: Adequate nutrition will be maintained Outcome: Progressing   Problem: Coping: Goal: Level of anxiety will decrease Outcome: Progressing   Problem: Elimination: Goal: Will not experience complications related to bowel motility Outcome: Progressing Goal: Will not experience complications related to urinary retention Outcome: Progressing   Problem: Pain Managment: Goal: General experience of comfort will improve Outcome: Progressing   Problem: Safety: Goal: Ability to remain free from injury will improve Outcome: Progressing   Problem: Skin Integrity: Goal: Risk for impaired skin integrity will decrease Outcome: Progressing   Problem: Education: Goal: Knowledge of risk factors and measures for prevention of condition will improve Outcome: Progressing   Problem: Coping: Goal: Psychosocial and spiritual needs will be supported Outcome: Progressing   Problem: Respiratory: Goal: Will maintain a patent airway Outcome: Progressing Goal: Complications related to the disease process, condition or treatment  will be avoided or minimized Outcome: Progressing   Problem: Education: Goal: Knowledge of risk factors and measures for prevention of condition will improve Outcome: Progressing   Problem: Coping: Goal: Psychosocial and spiritual needs will be supported Outcome: Progressing   Problem: Respiratory: Goal: Will maintain a patent airway Outcome: Progressing Goal: Complications related to the disease process, condition or treatment will be avoided or minimized Outcome: Progressing

## 2020-11-05 NOTE — Progress Notes (Signed)
EKG obtained to evaluate ST elevation alarm on monitor. Placed on chart per standing order.

## 2020-11-05 NOTE — Plan of Care (Signed)
Patient having frequent bowel movements with assistance of ordered medications.

## 2020-11-05 NOTE — Progress Notes (Signed)
[facility] for Infectious Disease   Reason for visit: Follow up on possible pneumonia  Interval History: Stenotrophomonas sample from 12/11 sensitivities noted and levofloxacin and TMP sensitive.  WBC wnl, afebrile.  Remains on levofloxacin, now day 8 (intermittent dosing).  CXR with unchanged multifocal pulmonary infiltrates.   Cefepime stopped  Physical Exam: Constitutional:  Vitals:   11/05/20 0815 11/05/20 1140  BP:    Pulse: 84   Resp: (!) 30   Temp:    SpO2: 100% 100%   patient appears in NAD HENT: +ET Respiratory: respiratory effort on vent Cardiovascular: RRR   Review of Systems: Unable to be assessed due to patient factors  Lab Results  Component Value Date   WBC 8.6 11/05/2020   HGB 9.4 (L) 11/05/2020   HCT 30.3 (L) 11/05/2020   MCV 103.8 (H) 11/05/2020   PLT 144 (L) 11/05/2020    Lab Results  Component Value Date   CREATININE 4.14 (H) 11/05/2020   BUN 152 (H) 11/05/2020   NA 140 11/05/2020   K 4.3 11/05/2020   CL 109 11/05/2020   CO2 18 (L) 11/05/2020    Lab Results  Component Value Date   ALT 29 11/05/2020   AST 18 11/05/2020   ALKPHOS 60 11/05/2020     Microbiology: Recent Results (from the past 240 hour(s))  Culture, respiratory (non-expectorated)     Status: None   Collection Time: 10/27/20  9:06 AM   Specimen: Tracheal Aspirate; Respiratory  Result Value Ref Range Status   Specimen Description   Final    TRACHEAL ASPIRATE Performed at [facility] [address] [place], [place] [address]    Special Requests   Final    NONE Performed at [facility], [place] [address] [place], [place] [address]    Gram Stain   Final    NO WBC SEEN MODERATE GRAM VARIABLE ROD FEW GRAM POSITIVE COCCI IN PAIRS Performed at [facility], [place] [address], [place], [place] [address]    Culture ABUNDANT STENOTROPHOMONAS MALTOPHILIA  Final   Report Status 10/31/2020 FINAL  Final   Organism ID, Bacteria  STENOTROPHOMONAS MALTOPHILIA  Final      Susceptibility   Stenotrophomonas maltophilia - MIC*    LEVOFLOXACIN 1 SENSITIVE Sensitive     TRIMETH/SULFA <=20 SENSITIVE Sensitive     * ABUNDANT STENOTROPHOMONAS MALTOPHILIA  MRSA PCR Screening     Status: None   Collection Time: 10/28/20 12:07 PM   Specimen: Nasal Mucosa; Nasopharyngeal  Result Value Ref Range Status   MRSA by PCR NEGATIVE NEGATIVE Final    Comment:        The GeneXpert MRSA Assay (FDA approved for NASAL specimens only), is one component of a comprehensive MRSA colonization surveillance program. It is not intended to diagnose MRSA infection nor to guide or monitor treatment for MRSA infections. Performed at [facility], [place], [place] [address], [place], [place] [address]   Culture, respiratory (non-expectorated)     Status: None   Collection Time: 11/02/20  7:32 AM   Specimen: Tracheal Aspirate; Respiratory  Result Value Ref Range Status   Specimen Description   Final    TRACHEAL ASPIRATE Performed at [place] [address], [place], [place] [address]    Special Requests   Final    NONE Performed at [facility], [place] [address] [place], [place] [address]    Gram Stain   Final    ABUNDANT  WBC PRESENT, PREDOMINANTLY PMN MODERATE GRAM NEGATIVE RODS RARE GRAM POSITIVE COCCI Performed at [facility], [place] [address], [place], [place] [address]    Culture MODERATE STENOTROPHOMONAS MALTOPHILIA  Final   Report Status 11/05/2020 FINAL  Final   Organism ID, Bacteria STENOTROPHOMONAS MALTOPHILIA  Final      Susceptibility   Stenotrophomonas maltophilia - MIC*    LEVOFLOXACIN 1 SENSITIVE Sensitive     TRIMETH/SULFA <=20 SENSITIVE Sensitive     * MODERATE STENOTROPHOMONAS MALTOPHILIA  Culture, blood (routine x 2)     Status: None (Preliminary result)   Collection Time: 11/02/20  8:14 AM   Specimen: BLOOD  Result Value Ref Range Status   Specimen  Description   Final    BLOOD LEFT ANTECUBITAL Performed at [facility] [address] [place], [place] [address]    Special Requests   Final    BOTTLES DRAWN AEROBIC ONLY Blood Culture adequate volume Performed at [place] [address], [place], [place] [address]    Culture   Final    NO GROWTH 2 DAYS Performed at [facility] [address], [place], [place] [address]    Report Status PENDING  Incomplete  Culture, blood (routine x 2)     Status: None (Preliminary result)   Collection Time: 11/02/20  8:14 AM   Specimen: BLOOD  Result Value Ref Range Status   Specimen Description   Final    BLOOD BLOOD LEFT FOREARM Performed at [facility] [address], [place], [place] [address]    Special Requests   Final    BOTTLES DRAWN AEROBIC ONLY Blood Culture adequate volume Performed at [facility] [address] [place], [place] [address]    Culture   Final    NO GROWTH 2 DAYS Performed at [facility] [address] [place], [place] [address]    Report Status PENDING  Incomplete  MRSA PCR Screening     Status: None   Collection Time: 11/02/20  9:56 AM   Specimen: Nasopharyngeal  Result Value Ref Range Status   MRSA by PCR NEGATIVE NEGATIVE Final    Comment:        The GeneXpert MRSA Assay (FDA approved for NASAL specimens only), is one component of a comprehensive MRSA colonization surveillance program. It is not intended to diagnose MRSA infection nor to guide or monitor treatment for MRSA infections. Performed at [facility], [place] [address] [place], [place] [address]   Culture, Urine     Status: None   Collection Time: 11/02/20 10:54 AM   Specimen: Urine, Random  Result Value Ref Range Status   Specimen Description   Final    URINE, RANDOM Performed at [place] [address] [place], [place] [address]    Special  Requests   Final    NONE Performed at [facility], [place] [address], [place], [place] [address]    Culture   Final    NO GROWTH Performed at [facility], [place] [address], [place], [place] [address]    Report Status 11/04/2020 FINAL  Final    Impression/Plan:  1. Possible pneumonia - Stenotrophomonas on trach aspirate in samples on 12/5 and 12/11.  Sensitive to both.  On levaquin and no changes.  Will continue through tomorrow and stop.  Continues off of cefepime.    2.  Respiratory failure - ARDS with some  improvement today with PEEP down to 10. Continues on prednisone.    3.  Renal failure - nonoliguric.  May need CRRT.  Followed now by nephrology.

## 2020-11-05 NOTE — Consult Note (Signed)
Renal Service Consult Note [facility]  Jackson Figueroa 11/05/2020 Sol Blazing, MD Requesting Physician: Dr [place] Bells  Reason for Consult: Acute renal failure HPI: The patient is a 59 y.o. year-old w/ hx of obesity presented on 10/20/20 to [facility] w/ profound hypoxemia, was intubated and tx'd to [facility] for ICU care for COVID pna. Pt was confused on presentation. No prior PMH or meds. Pt required sedation/ neuromuscular blockade and pressor support. Renal fxn was poor then improved and PEEP trended down. Got remdesivir /IV steroids. Creat was 1.1 on admit, peaked at 1.9 then improved to 1.1- 1.4 range for a few days , now has increased from 2.1 on 12/08 to 3.53 yest and 4.14 today w/ BUN 152.  UOP has been good over 1000 cc / day. Jackson Figueroa required pressors again 12/12- 12/13, now off again.  UA showed hematuria and pyuria. Asked to see for AKI.     ROS - n/a    Past Medical History  Past Medical History:  Diagnosis Date  . Obesity    Past Surgical History History reviewed. No pertinent surgical history. Family History History reviewed. No pertinent family history. Social History  reports that Jackson Figueroa has never smoked. Jackson Figueroa has never used smokeless tobacco. Jackson Figueroa reports current alcohol use. Jackson Figueroa reports that Jackson Figueroa does not use drugs. Allergies No Known Allergies Home medications Prior to Admission medications   Medication Sig Start Date End Date Taking? Authorizing Provider  Multiple Vitamin (MULTIVITAMIN WITH MINERALS) TABS tablet Take 1 tablet by mouth daily.   Yes [provider]     Vitals:   11/05/20 0600 11/05/20 0800 11/05/20 0815 11/05/20 1140  BP:  (!) 150/60    Pulse: 81 82 84   Resp: 20 (!) 22 (!) 30   Temp: (!) 96.8 F (36 C) (!) 96.98 F (36.1 C)    TempSrc:  Bladder    SpO2: 100% 100% 100% 100%  Weight:      Height:       Exam Gen NG tube, ETT in place No rash, cyanosis or gangrene Sclera anicteric  No jvd or bruits Chest occ coarse BS RRR no MRG Abd soft  ntnd no mass or ascites +bs Rectal tube in place GU normal male w/ foley draining amber clear urine good amts  MS no joint effusions or deformity Ext 1-2 bilat UE/ LE edema Neuro sedated on the vent    IV abx:  Vancomycin 12/5-12/6 and 12/11 x 1  Levaquin 12/7- 12/10 Cefepime 12/5 -12/7 and 12/11- current   UA >50 rbc, 11-20 wbc, rare bact, 30 prot   Renal US - pending    UNa, UCr pending    CXR - 12/14 > IMPRESSION: Prominent multifocal bilateral pulmonary infiltrates/edema again noted without interim change.  Assessment/ Plan: 1. Renal failure - acute kidney injury, nonoliguric w/ rising creat / BUN due to shock + hemodynamics/ ^PEEP effects +/-COVID -10 effects.  Approaching need for RRT. Get renal US and urine lytes, will follow.  2. BP/ volume - was on pressors 11-04-11/13, now off. Moderate edema on exam. Making urine in good amts.  3. COVID pna/ ARDS - per CCM, mult infiltrates on CXR 4. Diarrhea - rectal tube 5. Stenotrophomonas PNA - per CCM      Rob Kameran Lallier  MD 11/05/2020, 12:23 PM  Recent Labs  Lab 11/04/20 0600 11/05/20 0515  WBC 11.4* 8.6  HGB 10.4* 9.4*   Recent Labs  Lab 11/02/20 0630 11/03/20 0420 11/04/20 0600 11/05/20 0515  K 4.9   < > 5.3* 4.3  BUN 111*   < > 150* 152*  CREATININE 2.68*   < > 3.53* 4.14*  CALCIUM 8.7*   < > 8.2* 8.1*  PHOS 6.4*  --   --   --    < > = values in this interval not displayed.

## 2020-11-05 NOTE — Progress Notes (Signed)
NAME:  Jackson Figueroa, MRN:  [id number], DOB:  1961-05-24, LOS: 31 ADMISSION DATE:  10/20/2020, CONSULTATION DATE:  11/29 REFERRING MD:  Sabra Heck, CHIEF COMPLAINT:  ARDS COVID 95   Brief History   59 year old male with no significant past medical history and a positive COVID-19 test on 11/26 presented to [facility] with profound hypoxemia on 11/28.  Intubated immediately upon arrival found to be in ARDS.  Transferred to [facility] hospital for ongoing ICU care in the early a.m. hours of 11/29.  Past Medical History  obesity  Significant Hospital Events   11/28 [facility] emergency department where he was promptly intubated for hypoxemia and airway protection.  In the emergency department he was started on empiric antibiotics and developed shock requiring norepinephrine infusion.  He was transferred to [facility] for ICU admission.  Therapeutics for COVID-19 initiated include dexamethasone and remdesivir. 11/29 shock state persisted. Central line placed, ARDS protocol continued, proning initiated 11/30: Pressor requirements continued, neuromuscular blockade initiated to facilitate ventilation tube feeds started, initiated proning 12/1: Stopping neuromuscular blockade, proning positioning protocol discontinued, added free water for rising creatinine and sodium 12/2: PEEP trending down, maintaining pulse oximetry.  Renal function improved. Changed to versed from prop gtt d/t rising triglycerides  12/3 Remdesivir now completed. Peep/FIO2 stable. Free water adjusted. Solumedrol changed to pred 40mg , changing fentanyl to Dilaudid, adding Oxycodone  12/4-5 continued dyssynchrony, given paralytic overnight, changed to ketamine; vomited and aspirated, restarted paralytics 12/6: proned, under NMB 12/7: proning, NMB continued. Switched antibiotics for stenotrophomonas 12/13 stopped oxycodone, added dilaudid oral, changed from continuous nimbex infusion to prn  Consults:    Procedures:   ETT 11/28 > CVL 11/29 >12/9 R PICC 12/9 >   Significant Diagnostic Tests:  Echo 11/29 > LVEF 50-55%, mild LVH, RV systolic function mildly reduced. Mild dilation of ascending aorta 28mm. 12/8 Lower extre doppler ultrasound > negative for DVT  Micro Data:  Blood 11/29 > Urine 11/28 >  SARS COV2/Flu > negative  12/5 resp> few GPC> stenotrophomonas> sensitive to Bactrim & levofloxacin  12/11 respiratory> stenotrophamonas 12/11 blood> 12/11 urine> negative  Antimicrobials:  CTX 11/28 > 11/29 Azithromycin 11/28 > 11/29  remdesivir 11/28 > 12/2 Prednisone 11/28 >  baricitinib 12/4 > 12/5   12/5 vanc > 12/7, restarted 12/11 x1 12/5 cefepime > 12/7, restarted 12/11 12/7 levofloxacin>   Interim history/subjective:    PEEP down to 10 this morning Some dyssynchrony Required vec push again overnight   Objective   Blood pressure (!) 150/60, pulse 84, temperature (!) 96.98 F (36.1 C), temperature source Bladder, resp. rate (!) 30, height 6' (1.829 m), weight 129.1 kg, SpO2 100 %.    Vent Mode: PRVC FiO2 (%):  [40 %-55 %] 40 % Set Rate:  [25 bmp] 25 bmp Vt Set:  [540 mL] 540 mL PEEP:  [14 cmH20] 14 cmH20 Plateau Pressure:  [17 cmH20-25 cmH20] 22 cmH20   Intake/Output Summary (Last 24 hours) at 11/05/2020 0959 Last data filed at 11/05/2020 0900 Gross per 24 hour  Intake 3286.43 ml  Output 2050 ml  Net 1236.43 ml   Filed Weights   11/01/20 0438 11/04/20 0500 11/05/20 0456  Weight: 129.3 kg 129.1 kg 129.1 kg    Examination:  General:  In bed on vent HENT: NCAT ETT in place PULM: CTA B, vent supported breathing CV: RRR, no mgr GI: BS+, soft, nontender MSK: normal bulk and tone Neuro: sedated on vent    12/5 CXR images reviewed> bibasilar air  space disease, slightly worse in appearance compared to 12/4  [facility] Problem list     Assessment & Plan:  ARDS due to COVID 19 pneumonia> worse after aspiration on 12/5; improving as of 12/14 Full  mechanical vent support VAP prevention Daily WUA/SBT 12/14 change to pressure control, liberalize TVol strategy somewhat for comfort and to minimize sedation  Stenotrophomonas pneumonia, persistent on repeat culture Has been treated with multiple antibiotics this week Not clearly improving Appreciate ID Continue cefepime/levaquin  Severe ventilator dyssynchrony Acute metabolic encephalopathy RASS target -2 to -3 Continue dilaudid and midazolam infusions Continue oral dilaudid Continue clonazepam Change to PAD protocol from NMB protocol D/c nimbex  Vomiting> ileus Continue NG to low wall suction Continue post pyloric feedings, increase rate today  Hyperglycemia Glargine, ssi, linagliptin  Nutrition Continue tube feeding  Non-oliguric AKI > worsening Monitor BMET and UOP Replace electrolytes as needed Nephrology consult today: does he need CRRT? Would prefer to sister to weigh in on this  Diarrhea Monitor for fever, WBC count  Palliative care consulted.  Difficult to know how to proceed.  Would like to have his sister involved in decision making.  Best practice (evaluated daily)   Diet: tube feeding Pain/Anxiety/Delirium protocol (if indicated): as above VAP protocol (if indicated): yes DVT prophylaxis: lovenox GI prophylaxis: famotidine Glucose control: as above Mobility: bed rest last date of multidisciplinary goals of care discussion 12/13 with girlfriend and Jackson Figueroa, trying to contact sister Family and staff present n/a Summary of discussion n/a Follow up goals of care discussion due 12/14 Code Status: full Disposition: remain in ICU  Labs   CBC: Recent Labs  Lab 11/01/20 0500 11/02/20 0630 11/03/20 0420 11/04/20 0600 11/05/20 0515  WBC 12.3* 16.0* 19.0* 11.4* 8.6  NEUTROABS  --   --   --   --  6.7  HGB 11.5* 11.3* 11.8* 10.4* 9.4*  HCT 37.9* 36.6* 38.5* 33.8* 30.3*  MCV 106.8* 106.1* 106.4* 108.0* 103.8*  PLT 199 208 214 169 144*    Basic  Metabolic Panel: Recent Labs  Lab 11/01/20 0500 11/02/20 0630 11/03/20 0420 11/04/20 0600 11/05/20 0515  NA 144 143 144 141 140  K 5.2* 4.9 5.1 5.3* 4.3  CL 112* 111 110 108 109  CO2 23 21* 21* 19* 18*  GLUCOSE 119* 212* 130* 108* 113*  BUN 91* 111* 136* 150* 152*  CREATININE 1.81* 2.68* 3.20* 3.53* 4.14*  CALCIUM 9.0 8.7* 8.7* 8.2* 8.1*  PHOS  --  6.4*  --   --   --    GFR: Estimated Creatinine Clearance: 27.3 mL/min (A) (by C-G formula based on SCr of 4.14 mg/dL (H)). Recent Labs  Lab 11/02/20 0630 11/03/20 0420 11/04/20 0600 11/05/20 0515  WBC 16.0* 19.0* 11.4* 8.6    Liver Function Tests: Recent Labs  Lab 11/05/20 0515  AST 18  ALT 29  ALKPHOS 60  BILITOT 0.6  PROT 5.9*  ALBUMIN 1.9*   No results for input(s): LIPASE, AMYLASE in the last 168 hours. No results for input(s): AMMONIA in the last 168 hours.  ABG    Component Value Date/Time   PHART 7.056 (LL) 11/04/2020 1140   PCO2ART 73.6 (HH) 11/04/2020 1140   PO2ART 171 (H) 11/04/2020 1140   HCO3 19.7 (L) 11/04/2020 1140   ACIDBASEDEF 11.2 (H) 11/04/2020 1140   O2SAT 98.9 11/04/2020 1140     Coagulation Profile: Recent Labs  Lab 11/02/20 0956  INR 1.2    Cardiac Enzymes: No results for input(s): CKTOTAL, CKMB, CKMBINDEX,  TROPONINI in the last 168 hours.  HbA1C: Hgb A1c MFr Bld  Date/Time Value Ref Range Status  10/22/2020 03:38 AM 5.3 4.8 - 5.6 % Final    Comment:    (NOTE)         Prediabetes: 5.7 - 6.4         Diabetes: >6.4         Glycemic control for adults with diabetes: <7.0     CBG: Recent Labs  Lab 11/04/20 0812 11/04/20 1317 11/04/20 1611 11/04/20 2048 11/05/20 0013  GLUCAP 101* 155* 201* 153* 121*     Critical care time: 35 minutes    Roselie Awkward, MD [place] [facility] Pager: [phone] Cell: [phone] If no response, call [phone]

## 2020-11-05 NOTE — Plan of Care (Signed)
[facility] P[facility]  I spoke to his sister Neoma Laming at length today about goals of care.  I explained to her that while Brad's lungs are better, his kidneys are worse.  He is profoundly deconditioned, he requires vasopressor support.  His overall chances of survival are low, and the only way to survive is continued long term aggressive support involving multiple forms of life support.  She tells me that they have had multiple family members and friends go through prolonged life support from [place] and survive.  She wants to discuss this tonight with her brothers before making a formal decision about dialysis and tracheostomy.   I will discuss this with her tomorrow.  Roselie Awkward, MD [place] [facility]M Pager: [phone] Cell: [phone] If no response, call [phone]

## 2020-11-06 ENCOUNTER — Inpatient Hospital Stay (HOSPITAL_COMMUNITY): Payer: BC Managed Care – PPO

## 2020-11-06 LAB — BLOOD GAS, ARTERIAL
Acid-base deficit: 12.6 mmol/L — ABNORMAL HIGH (ref 0.0–2.0)
Bicarbonate: 19 mmol/L — ABNORMAL LOW (ref 20.0–28.0)
Drawn by: 308601
FIO2: 100
MECHVT: 540 mL
O2 Saturation: 91.8 %
Patient temperature: 98.6
RATE: 15 resp/min
pCO2 arterial: 77.2 mmHg (ref 32.0–48.0)
pH, Arterial: 7.02 — CL (ref 7.350–7.450)
pO2, Arterial: 89.5 mmHg (ref 83.0–108.0)

## 2020-11-06 LAB — COMPREHENSIVE METABOLIC PANEL
ALT: 26 U/L (ref 0–44)
AST: 19 U/L (ref 15–41)
Albumin: 2 g/dL — ABNORMAL LOW (ref 3.5–5.0)
Alkaline Phosphatase: 63 U/L (ref 38–126)
Anion gap: 13 (ref 5–15)
BUN: 158 mg/dL — ABNORMAL HIGH (ref 6–20)
CO2: 18 mmol/L — ABNORMAL LOW (ref 22–32)
Calcium: 8.1 mg/dL — ABNORMAL LOW (ref 8.9–10.3)
Chloride: 107 mmol/L (ref 98–111)
Creatinine, Ser: 4.43 mg/dL — ABNORMAL HIGH (ref 0.61–1.24)
GFR, Estimated: 15 mL/min — ABNORMAL LOW (ref >60)
Glucose, Bld: 110 mg/dL — ABNORMAL HIGH (ref 70–99)
Potassium: 4.2 mmol/L (ref 3.5–5.1)
Sodium: 138 mmol/L (ref 135–145)
Total Bilirubin: 0.6 mg/dL (ref 0.3–1.2)
Total Protein: 6.5 g/dL (ref 6.5–8.1)

## 2020-11-06 LAB — CBC WITH DIFFERENTIAL/PLATELET
Abs Immature Granulocytes: 0.39 10*3/uL — ABNORMAL HIGH (ref 0.00–0.07)
Basophils Absolute: 0 10*3/uL (ref 0.0–0.1)
Basophils Relative: 0 %
Eosinophils Absolute: 0.1 10*3/uL (ref 0.0–0.5)
Eosinophils Relative: 1 %
HCT: 28.7 % — ABNORMAL LOW (ref 39.0–52.0)
Hemoglobin: 9.4 g/dL — ABNORMAL LOW (ref 13.0–17.0)
Immature Granulocytes: 4 %
Lymphocytes Relative: 6 %
Lymphs Abs: 0.6 10*3/uL — ABNORMAL LOW (ref 0.7–4.0)
MCH: 33.7 pg (ref 26.0–34.0)
MCHC: 32.8 g/dL (ref 30.0–36.0)
MCV: 102.9 fL — ABNORMAL HIGH (ref 80.0–100.0)
Monocytes Absolute: 1.1 10*3/uL — ABNORMAL HIGH (ref 0.1–1.0)
Monocytes Relative: 12 %
Neutro Abs: 7 10*3/uL (ref 1.7–7.7)
Neutrophils Relative %: 77 %
Platelets: 149 10*3/uL — ABNORMAL LOW (ref 150–400)
RBC: 2.79 MIL/uL — ABNORMAL LOW (ref 4.22–5.81)
RDW: 14.3 % (ref 11.5–15.5)
WBC: 9.2 10*3/uL (ref 4.0–10.5)
nRBC: 0 % (ref 0.0–0.2)

## 2020-11-06 LAB — GLUCOSE, CAPILLARY
Glucose-Capillary: 114 mg/dL — ABNORMAL HIGH (ref 70–99)
Glucose-Capillary: 138 mg/dL — ABNORMAL HIGH (ref 70–99)
Glucose-Capillary: 141 mg/dL — ABNORMAL HIGH (ref 70–99)
Glucose-Capillary: 85 mg/dL (ref 70–99)
Glucose-Capillary: 94 mg/dL (ref 70–99)

## 2020-11-06 MED ORDER — ROCURONIUM BROMIDE 10 MG/ML (PF) SYRINGE
100.0000 mg | PREFILLED_SYRINGE | Freq: Once | INTRAVENOUS | Status: AC
  Administered 2020-11-06: 19:00:00 100 mg via INTRAVENOUS
  Filled 2020-11-06: qty 10

## 2020-11-06 MED ORDER — LIP MEDEX EX OINT
1.0000 "application " | TOPICAL_OINTMENT | CUTANEOUS | Status: DC | PRN
  Administered 2020-11-06 – 2020-11-19 (×8): 1 via TOPICAL
  Filled 2020-11-06: qty 7

## 2020-11-06 MED ORDER — COLLAGENASE 250 UNIT/GM EX OINT
TOPICAL_OINTMENT | Freq: Every day | CUTANEOUS | Status: DC
  Administered 2020-11-07: 1 via TOPICAL
  Filled 2020-11-06: qty 30

## 2020-11-06 NOTE — Progress Notes (Signed)
Patients family talked to him via phone due to e-link video not working with significant other or sons phone numbers.

## 2020-11-06 NOTE — Progress Notes (Signed)
CRITICAL VALUE ALERT  Critical Value:  PH 7.020 P02- 77.2   Date & Time Notied:  11/06/2020 21:50  Provider Notified: E-link notified    Orders Received/Actions taken: Vent settings has been changed by RT

## 2020-11-06 NOTE — Plan of Care (Signed)
Discussed with patient' significant other plan of care for the evening, temperature management, appearance with equipment and possible removal of COVID restrictions by the beginning of next week with some teach back displayed.  Informed her that the primary MD and infectious disease has to approve past the 21 days.  Problem: Education: Goal: Knowledge of General Education information will improve Description: Including pain rating scale, medication(s)/side effects and non-pharmacologic comfort measures Outcome: Progressing   Problem: Health Behavior/Discharge Planning: Goal: Ability to manage health-related needs will improve Outcome: Progressing   Problem: Coping: Goal: Level of anxiety will decrease Outcome: Progressing

## 2020-11-06 NOTE — Progress Notes (Signed)
[facility] PCCM  Worsening ventilator synchrony Some secretions from ETT, more from mouth CXR without new change Put NG back to suction 1 time dose of rocuronium Increase versed  Roselie Awkward, MD [place] [facility]M Pager: [phone] Cell: [phone] If no response, call [phone]

## 2020-11-06 NOTE — Plan of Care (Signed)
Discussed in front of patient with significant other plan of care for the evening, pain management and E-link video conferencing to be able to see the patient with some teach back displayed by family at this time.  Patient's significant other able to be on speaker to talk to the patient; patient unresponsive and on sedation at this time with ventilator.  Problem: Education: Goal: Knowledge of General Education information will improve Description: Including pain rating scale, medication(s)/side effects and non-pharmacologic comfort measures Outcome: Progressing   Problem: Health Behavior/Discharge Planning: Goal: Ability to manage health-related needs will improve Outcome: Progressing   Problem: Coping: Goal: Level of anxiety will decrease Outcome: Progressing

## 2020-11-06 NOTE — Consult Note (Signed)
[facility] Nurse wound follow up note Consultation completed by use of remote telehealth camera cart/Elink technology and with assistance from bedside nurse/clinical staff  Wound type: Deep Tissue Pressure Injury Pressure Injury POA: No Measurement:see nursing flow sheets Wound QXL:LIYI/[id number] tissue, superficial skin loss Drainage (amount, consistency, odor)minimal  Periwound:intact  Dressing procedure/placement/frequency: Add enzymatic debridement agent daily.  Low air loss mattress in place RD managing nutrition currently Patient COVID + with vasculopathy changes that can result in skin failure.   [facility] Nurse team will follow with you and see patient within 10 days for wound assessments.  Please notify [place] nurses of any acute changes in the wounds or any new areas of concern [place] MSN, [place], [place], [place]

## 2020-11-06 NOTE — Progress Notes (Signed)
[facility] Progress Note  Subjective:  Patient not examined today directly given COVID-19 + status, utilizing data taken from chart +/- discussions w/ providers and staff.  B/Cr stable today, 2.2 L UOP yest, d/w Dr Lake Bells, resp status improving some.     Vitals:   11/06/20 0851 11/06/20 0900 11/06/20 1040 11/06/20 1100  BP: (!) 152/68     Pulse: 99 99  98  Resp: $Remo'20 18  15  'wcpme$ Temp:  98.78 F (37.1 C)  98.78 F (37.1 C)  TempSrc:      SpO2: 94% 94% 94% 93%  Weight:      Height:        Exam:  Patient not examined today directly given COVID-19 + status, utilizing data taken from chart +/- discussions w/ providers and staff.      IV abx:  Vancomycin 12/5-12/6 and 12/11 x 1  Levaquin 12/7- 12/10 Cefepime 12/5 -12/7 and 12/11- current   UA >50 rbc, 11-20 wbc, rare bact, 30 prot   Renal US - IMPRESSION: Normal kidneys.  Bladder not visualized.    UNa 19, UCr 49     CXR - 12/15 > IMPRESSION: Lower lung volumes with bilateral COVID-19 pneumonia mildly improved compared to 11/02/2020. No new cardiopulmonary abnormality.  Assessment/ Plan: 1. Renal failure - acute kidney injury, nonoliguric w/ rising creat / BUN due to shock +/-  hemodynamics/ ^PEEP effects +/-COVID-19 effects.  [place] w/o hydro, normal appearing kidneys. UA +hematuria/ pyuria. BUN/Cr stable today and good UOP 2.2 L yest. No strong indication for RRT yet. Will follow.  2. BP/ volume - was on pressors 11-04-11/13, now off. Moderate edema on exam, up 10 kg from admit. Making urine in good amts.  3. COVID pna/ ARDS - per CCM, mult infiltrates on CXR 4. Diarrhea - rectal tube 5. Stenotrophomonas PNA - per CCM     Jackson Figueroa 11/06/2020, 11:48 AM   Recent Labs  Lab 11/02/20 0630 11/03/20 0420 11/05/20 0515 11/06/20 0316  K 4.9   < > 4.3 4.2  BUN 111*   < > 152* 158*  CREATININE 2.68*   < > 4.14* 4.43*  CALCIUM 8.7*   < > 8.1* 8.1*  PHOS 6.4*  --   --   --   HGB 11.3*   < > 9.4* 9.4*   < > =  values in this interval not displayed.   Inpatient medications: . chlorhexidine gluconate (MEDLINE KIT)  15 mL Mouth Rinse BID  . Chlorhexidine Gluconate Cloth  6 each Topical Q0600  . clonazePAM  2 mg Per Tube Q8H  . collagenase   Topical Daily  . docusate  100 mg Per Tube BID  . famotidine  20 mg Per Tube Daily  . feeding supplement (PIVOT 1.5 CAL)  1,000 mL Per Tube Q24H  . feeding supplement (PROSource TF)  90 mL Per Tube QID  . heparin injection (subcutaneous)  7,500 Units Subcutaneous Q8H  .  HYDROmorphone (DILAUDID) injection  1 mg Intravenous Once  . HYDROmorphone HCl  3 mg Oral Q4H  . insulin aspart  0-15 Units Subcutaneous Q4H  . insulin aspart  1 Units Subcutaneous Q4H  . insulin glargine  5 Units Subcutaneous Daily  . linagliptin  5 mg Per Tube Daily  . mouth rinse  15 mL Mouth Rinse 10 times per day  . multivitamin  15 mL Per Tube Daily  . polyethylene glycol  17 g Per Tube Daily  . predniSONE  20 mg Per Tube  Q breakfast  . sodium chloride flush  10-40 mL Intracatheter Q12H  . sodium zirconium cyclosilicate  10 g Per Tube BID  . thiamine  100 mg Per Tube Daily   . sodium chloride 10 mL/hr at 11/04/20 0800  . dextrose 75 mL/hr at 11/06/20 0645  . HYDROmorphone 4 mg/hr (11/06/20 1121)  . midazolam 8 mg/hr (11/06/20 1122)   sodium chloride, acetaminophen (TYLENOL) oral liquid 160 mg/5 mL, HYDROmorphone, lip balm, midazolam, sodium chloride flush

## 2020-11-06 NOTE — Progress Notes (Signed)
Attempted to call patient's sister, Jarryd Gratz, at [phone] but was unable to reach her or leave voicemail.  Patient's significant other, [name], had told me that she thought that Deborah's number is in the computer incorrectly.  She believes that the correct number is [phone].  I attempted to call this number as well but there was no answer and no identifiers on voicemail so I did not leave voicemail.  Micheline Rough, MD [place] [facility] Team [phone]  NO CHARGE NOTE

## 2020-11-06 NOTE — Progress Notes (Signed)
RT called to assess patient with low Ve. Patient had just received a paralytic med. Patient was removed and manually ventilated by RN while RT checked all sources of potential leak to reduce Ve, but none found. Cuff pressures and tube currently acceptable at this time and patient easily ventilated. Patient placed back on vent and low Ve remained. Vent mode changed back to previous PRVC settings and patient currently comfortable on those settings. Dr. Lake Bells made aware. RT will continue to monitor patient.

## 2020-11-06 NOTE — Progress Notes (Signed)
1 time dose rocuronium pushed, minute volumes on ventilator dropped to less than 2. O2 sats were low 90s, opened room door to ask another RN to call RT. Sats dropped into the 50s, pt was taken off ventilator and bagged until RT arrived. Returned to previous PRVC settings and minute volumes improved. Dr. Lake Bells aware.

## 2020-11-06 NOTE — Progress Notes (Signed)
NAME:  Jackson Figueroa, MRN:  [id number], DOB:  30-Sep-1961, LOS: 51 ADMISSION DATE:  10/20/2020, CONSULTATION DATE:  11/29 REFERRING MD:  Sabra Heck, CHIEF COMPLAINT:  ARDS COVID 19   Brief History   59 year old male with no significant past medical history and a positive COVID-19 test on 11/26 presented to [facility] with profound hypoxemia on 11/28.  Intubated immediately upon arrival found to be in ARDS.  Transferred to [facility] long hospital for ongoing ICU care in the early a.m. hours of 11/29.  Past Medical History  obesity  Significant Hospital Events   11/28 [facility] emergency department where he was promptly intubated for hypoxemia and airway protection.  In the emergency department he was started on empiric antibiotics and developed shock requiring norepinephrine infusion.  He was transferred to [facility] for ICU admission.  Therapeutics for COVID-19 initiated include dexamethasone and remdesivir. 11/29 shock state persisted. Central line placed, ARDS protocol continued, proning initiated 11/30: Pressor requirements continued, neuromuscular blockade initiated to facilitate ventilation tube feeds started, initiated proning 12/1: Stopping neuromuscular blockade, proning positioning protocol discontinued, added free water for rising creatinine and sodium 12/2: PEEP trending down, maintaining pulse oximetry.  Renal function improved. Changed to versed from prop gtt d/t rising triglycerides  12/3 Remdesivir now completed. Peep/FIO2 stable. Free water adjusted. Solumedrol changed to pred 40mg , changing fentanyl to Dilaudid, adding Oxycodone  12/4-5 continued dyssynchrony, given paralytic overnight, changed to ketamine; vomited and aspirated, restarted paralytics 12/6: proned, under NMB 12/7: proning, NMB continued. Switched antibiotics for stenotrophomonas 12/13 stopped oxycodone, added dilaudid oral, changed from continuous nimbex infusion to prn 12/14 changed to pressure  control  Consults:    Procedures:  ETT 11/28 > CVL 11/29 >12/9 R PICC 12/9 >   Significant Diagnostic Tests:  Echo 11/29 > LVEF 50-55%, mild LVH, RV systolic function mildly reduced. Mild dilation of ascending aorta 19mm. 12/8 Lower extre doppler ultrasound > negative for DVT  Micro Data:  Blood 11/29 > Urine 11/28 >  SARS COV2/Flu > negative  12/5 resp> few GPC> stenotrophomonas> sensitive to Bactrim & levofloxacin  12/11 respiratory> stenotrophamonas 12/11 blood> 12/11 urine> negative  Antimicrobials:  CTX 11/28 > 11/29 Azithromycin 11/28 > 11/29  remdesivir 11/28 > 12/2 Prednisone 11/28 >  baricitinib 12/4 > 12/5   12/5 vanc > 12/7, restarted 12/11 x1 12/5 cefepime > 12/7, restarted 12/11 > 12/7 levofloxacin>   Interim history/subjective:   No acute events Changed to pressure control Weaning sedation slightly   Objective   Blood pressure (!) 172/73, pulse 91, temperature 98.6 F (37 C), resp. rate (!) 25, height 6' (1.829 m), weight 131.6 kg, SpO2 90 %.    Vent Mode: PCV FiO2 (%):  [40 %] 40 % Set Rate:  [15 bmp] 15 bmp PEEP:  [8 cmH20] 8 cmH20 Plateau Pressure:  [23 cmH20-25 cmH20] 25 cmH20   Intake/Output Summary (Last 24 hours) at 11/06/2020 0825 Last data filed at 11/06/2020 0645 Gross per 24 hour  Intake 3562.69 ml  Output 2100 ml  Net 1462.69 ml   Filed Weights   11/05/20 0456 11/06/20 0500 11/06/20 0645  Weight: 129.1 kg 131.6 kg 131.6 kg    Examination:  General:  In bed on vent HENT: NCAT ETT in place PULM: CTA B, vent supported breathing CV: RRR, no mgr GI: BS+, soft, nontender MSK: normal bulk and tone Neuro: sedated on vent   12/15 CXR images reviewed> improvement in bilateral airspace disease  Resolved Hospital Problem list  Assessment & Plan:  ARDS due to COVID 19 pneumonia> worse after aspiration on 12/5; improving as of 12/15 Full mechanical vent support VAP prevention Daily WUA/SBT 12/15 plan: pressure  support wean today, prednisone to stop tomorrow  Stenotrophomonas pneumonia, persistent on repeat culture; improvement clinically as of 12/14 Cefepime/levaquin per ID  Severe ventilator dyssynchrony Acute metabolic encephalopathy RASS target -1- to -2 Dilaudid, versed infusions per PAD Continue oral dilaudid Continue clonazepam Cap versed at 6mg /hr  Vomiting> ileus, resolved Nutrition Clamp NG Stop reglan Continue tube feeding at 20cc/hr, increase 12/16  Hyperglycemia Glargine, ssi, linagliptin  Non-oliguric AKI > worsening labs but overall picture improving Monitor BMET and UOP Replace electrolytes as needed Perhaps we can avoid CRRT as he is improving clinicaly  Diarrhea Monitor for fever, WBC count  Goals of care: sister to discuss long term plans with her brother.  She did not answer when I called today.  Best practice (evaluated daily)   Diet: tube feeding Pain/Anxiety/Delirium protocol (if indicated): as above VAP protocol (if indicated): yes DVT prophylaxis: lovenox GI prophylaxis: famotidine Glucose control: as above Mobility: bed rest last date of multidisciplinary goals of care discussion 12/14 with sister,  Family and staff present n/a Summary of discussion n/a Follow up goals of care discussion due 12/15 Code Status: full Disposition: remain in ICU  Labs   CBC: Recent Labs  Lab 11/02/20 0630 11/03/20 0420 11/04/20 0600 11/05/20 0515 11/06/20 0316  WBC 16.0* 19.0* 11.4* 8.6 9.2  NEUTROABS  --   --   --  6.7 7.0  HGB 11.3* 11.8* 10.4* 9.4* 9.4*  HCT 36.6* 38.5* 33.8* 30.3* 28.7*  MCV 106.1* 106.4* 108.0* 103.8* 102.9*  PLT 208 214 169 144* 149*    Basic Metabolic Panel: Recent Labs  Lab 11/02/20 0630 11/03/20 0420 11/04/20 0600 11/05/20 0515 11/06/20 0316  NA 143 144 141 140 138  K 4.9 5.1 5.3* 4.3 4.2  CL 111 110 108 109 107  CO2 21* 21* 19* 18* 18*  GLUCOSE 212* 130* 108* 113* 110*  BUN 111* 136* 150* 152* 158*  CREATININE  2.68* 3.20* 3.53* 4.14* 4.43*  CALCIUM 8.7* 8.7* 8.2* 8.1* 8.1*  PHOS 6.4*  --   --   --   --    GFR: Estimated Creatinine Clearance: 25.8 mL/min (A) (by C-G formula based on SCr of 4.43 mg/dL (H)). Recent Labs  Lab 11/03/20 0420 11/04/20 0600 11/05/20 0515 11/06/20 0316  WBC 19.0* 11.4* 8.6 9.2    Liver Function Tests: Recent Labs  Lab 11/05/20 0515 11/06/20 0316  AST 18 19  ALT 29 26  ALKPHOS 60 63  BILITOT 0.6 0.6  PROT 5.9* 6.5  ALBUMIN 1.9* 2.0*   No results for input(s): LIPASE, AMYLASE in the last 168 hours. No results for input(s): AMMONIA in the last 168 hours.  ABG    Component Value Date/Time   PHART 7.252 (L) 11/05/2020 1140   PCO2ART 42.5 11/05/2020 1140   PO2ART 87.3 11/05/2020 1140   HCO3 18.3 (L) 11/05/2020 1140   ACIDBASEDEF 8.1 (H) 11/05/2020 1140   O2SAT 96.6 11/05/2020 1140     Coagulation Profile: Recent Labs  Lab 11/02/20 0956  INR 1.2    Cardiac Enzymes: No results for input(s): CKTOTAL, CKMB, CKMBINDEX, TROPONINI in the last 168 hours.  HbA1C: Hgb A1c MFr Bld  Date/Time Value Ref Range Status  10/22/2020 03:38 AM 5.3 4.8 - 5.6 % Final    Comment:    (NOTE)  Prediabetes: 5.7 - 6.4         Diabetes: >6.4         Glycemic control for adults with diabetes: <7.0     CBG: Recent Labs  Lab 11/05/20 1236 11/05/20 1552 11/05/20 2105 11/06/20 0021 11/06/20 0316  GLUCAP 107* 124* 104* 85 94     Critical care time: 32 minutes    Roselie Awkward, MD [place] [facility] Pager: [phone] Cell: [phone] If no response, call [phone]

## 2020-11-06 NOTE — Progress Notes (Signed)
[facility] for Infectious Disease   Reason for visit: possible pneumonia  Interval History: remains on levaquin, no acute events.  No positive blood cultures.  No new growth from the respiratory culture.    Physical Exam: Constitutional:  Vitals:   11/06/20 1400 11/06/20 1500  BP: (!) 163/90 (!) 154/89  Pulse: 99 96  Resp: 19 15  Temp: 98.42 F (36.9 C) 98.24 F (36.8 C)  SpO2: 93% 93%   patient appears in NAD HENT: +ET in place Respiratory: respiratory effort on vent; CTA B, anterior exam Cardiovascular: Tachy RR   Review of Systems: Unable to be assessed due to patient factors  Lab Results  Component Value Date   WBC 9.2 11/06/2020   HGB 9.4 (L) 11/06/2020   HCT 28.7 (L) 11/06/2020   MCV 102.9 (H) 11/06/2020   PLT 149 (L) 11/06/2020    Lab Results  Component Value Date   CREATININE 4.43 (H) 11/06/2020   BUN 158 (H) 11/06/2020   NA 138 11/06/2020   K 4.2 11/06/2020   CL 107 11/06/2020   CO2 18 (L) 11/06/2020    Lab Results  Component Value Date   ALT 26 11/06/2020   AST 19 11/06/2020   ALKPHOS 63 11/06/2020     Microbiology: Recent Results (from the past 240 hour(s))  MRSA PCR Screening     Status: None   Collection Time: 10/28/20 12:07 PM   Specimen: Nasal Mucosa; Nasopharyngeal  Result Value Ref Range Status   MRSA by PCR NEGATIVE NEGATIVE Final    Comment:        The GeneXpert MRSA Assay (FDA approved for NASAL specimens only), is one component of a comprehensive MRSA colonization surveillance program. It is not intended to diagnose MRSA infection nor to guide or monitor treatment for MRSA infections. Performed at [facility], [place] [address], [place], [place] [address]   Culture, respiratory (non-expectorated)     Status: None   Collection Time: 11/02/20  7:32 AM   Specimen: Tracheal Aspirate; Respiratory  Result Value Ref Range Status   Specimen Description   Final    TRACHEAL ASPIRATE Performed at [place] [address], [place], [place] [address]    Special Requests   Final    NONE Performed at [facility], [place] [address] [place], [place] [address]    Gram Stain   Final    ABUNDANT WBC PRESENT, PREDOMINANTLY PMN MODERATE GRAM NEGATIVE RODS RARE GRAM POSITIVE COCCI Performed at [facility], [place] [address], [place], [place] [address]    Culture MODERATE STENOTROPHOMONAS MALTOPHILIA  Final   Report Status 11/05/2020 FINAL  Final   Organism ID, Bacteria STENOTROPHOMONAS MALTOPHILIA  Final      Susceptibility   Stenotrophomonas maltophilia - MIC*    LEVOFLOXACIN 1 SENSITIVE Sensitive     TRIMETH/SULFA <=20 SENSITIVE Sensitive     * MODERATE STENOTROPHOMONAS MALTOPHILIA  Culture, blood (routine x 2)     Status: None (Preliminary result)   Collection Time: 11/02/20  8:14 AM   Specimen: BLOOD  Result Value Ref Range Status   Specimen Description   Final    BLOOD LEFT ANTECUBITAL Performed at [place] [address], [place], [place] [address]    Special Requests   Final    BOTTLES DRAWN AEROBIC ONLY Blood Culture adequate volume Performed at [place] [address], [place], [place] [address]    Culture  Final    NO GROWTH 4 DAYS Performed at [facility], [place] [address], [place], [place] [address]    Report Status PENDING  Incomplete  Culture, blood (routine x 2)     Status: None (Preliminary result)   Collection Time: 11/02/20  8:14 AM   Specimen: BLOOD  Result Value Ref Range Status   Specimen Description   Final    BLOOD BLOOD LEFT FOREARM Performed at [place] [address] [place], [place] [address]    Special Requests   Final    BOTTLES DRAWN AEROBIC ONLY Blood Culture adequate volume Performed at [place] [address] [place], [place] [address]    Culture   Final    NO GROWTH 4 DAYS Performed at [facility], [place] [address], [place], [place] [address]    Report Status PENDING  Incomplete  MRSA PCR Screening     Status: None   Collection Time: 11/02/20  9:56 AM   Specimen: Nasopharyngeal  Result Value Ref Range Status   MRSA by PCR NEGATIVE NEGATIVE Final    Comment:        The GeneXpert MRSA Assay (FDA approved for NASAL specimens only), is one component of a comprehensive MRSA colonization surveillance program. It is not intended to diagnose MRSA infection nor to guide or monitor treatment for MRSA infections. Performed at [facility], [place] [address], [place], [place] [address]   Culture, Urine     Status: None   Collection Time: 11/02/20 10:54 AM   Specimen: Urine, Random  Result Value Ref Range Status   Specimen Description   Final    URINE, RANDOM Performed at [place] [address] [place], [place] [address]    Special Requests   Final    NONE Performed at [facility], [place] [address], [place], [place] [address]    Culture   Final    NO GROWTH Performed at [facility], [place] [address] [place], [place] [address]    Report Status 11/04/2020 FINAL  Final    Impression/Plan:  1. Possible pneumonia - Stenotrophomonas on respiratory culture and sensitive to TMP and levaquin.  On levaquin now and will continue through tomorrow with intermittent dosing and then stop.    2. Respiratory failure - remains intubated and sedated.  Improvement on vent setting today and plan to pressure support wean today.    3.  Renal failure - non-oliguiric and creat a bit up today but good UOP since yesterday.  Not yet needing CRRT.

## 2020-11-07 ENCOUNTER — Inpatient Hospital Stay (HOSPITAL_COMMUNITY): Payer: BC Managed Care – PPO

## 2020-11-07 LAB — BLOOD GAS, ARTERIAL
Acid-base deficit: 9.7 mmol/L — ABNORMAL HIGH (ref 0.0–2.0)
Acid-base deficit: 11.9 mmol/L — ABNORMAL HIGH (ref 0.0–2.0)
Allens test (pass/fail): POSITIVE — AB
Bicarbonate: 17.3 mmol/L — ABNORMAL LOW (ref 20.0–28.0)
Bicarbonate: 16.1 mmol/L — ABNORMAL LOW (ref 20.0–28.0)
Drawn by: 308601
Drawn by: 270211
FIO2: 60
FIO2: 60
MECHVT: 540 mL
O2 Content: 10 L/min
O2 Saturation: 98.2 %
O2 Saturation: 96.8 %
PEEP: 8 cmH2O
PEEP: 8 cmH2O
Patient temperature: 98.6
Patient temperature: 98.6
RATE: 26 resp/min
pCO2 arterial: 45.3 mmHg (ref 32.0–48.0)
pCO2 arterial: 47.2 mmHg (ref 32.0–48.0)
pH, Arterial: 7.206 — ABNORMAL LOW (ref 7.350–7.450)
pH, Arterial: 7.16 — CL (ref 7.350–7.450)
pO2, Arterial: 146 mmHg — ABNORMAL HIGH (ref 83.0–108.0)
pO2, Arterial: 111 mmHg — ABNORMAL HIGH (ref 83.0–108.0)

## 2020-11-07 LAB — RENAL FUNCTION PANEL
Albumin: 2.3 g/dL — ABNORMAL LOW (ref 3.5–5.0)
Anion gap: 15 (ref 5–15)
BUN: 179 mg/dL — ABNORMAL HIGH (ref 6–20)
CO2: 16 mmol/L — ABNORMAL LOW (ref 22–32)
Calcium: 8.1 mg/dL — ABNORMAL LOW (ref 8.9–10.3)
Chloride: 104 mmol/L (ref 98–111)
Creatinine, Ser: 5.44 mg/dL — ABNORMAL HIGH (ref 0.61–1.24)
GFR, Estimated: 12 mL/min — ABNORMAL LOW (ref >60)
Glucose, Bld: 126 mg/dL — ABNORMAL HIGH (ref 70–99)
Phosphorus: 13.1 mg/dL — ABNORMAL HIGH (ref 2.5–4.6)
Potassium: 5.3 mmol/L — ABNORMAL HIGH (ref 3.5–5.1)
Sodium: 135 mmol/L (ref 135–145)

## 2020-11-07 LAB — GLUCOSE, CAPILLARY
Glucose-Capillary: 121 mg/dL — ABNORMAL HIGH (ref 70–99)
Glucose-Capillary: 126 mg/dL — ABNORMAL HIGH (ref 70–99)
Glucose-Capillary: 83 mg/dL (ref 70–99)
Glucose-Capillary: 85 mg/dL (ref 70–99)
Glucose-Capillary: 116 mg/dL — ABNORMAL HIGH (ref 70–99)
Glucose-Capillary: 124 mg/dL — ABNORMAL HIGH (ref 70–99)
Glucose-Capillary: 77 mg/dL (ref 70–99)

## 2020-11-07 LAB — BASIC METABOLIC PANEL
Anion gap: 15 (ref 5–15)
BUN: 178 mg/dL — ABNORMAL HIGH (ref 6–20)
CO2: 15 mmol/L — ABNORMAL LOW (ref 22–32)
Calcium: 8.2 mg/dL — ABNORMAL LOW (ref 8.9–10.3)
Chloride: 105 mmol/L (ref 98–111)
Creatinine, Ser: 5.41 mg/dL — ABNORMAL HIGH (ref 0.61–1.24)
GFR, Estimated: 12 mL/min — ABNORMAL LOW (ref >60)
Glucose, Bld: 124 mg/dL — ABNORMAL HIGH (ref 70–99)
Potassium: 4.8 mmol/L (ref 3.5–5.1)
Sodium: 135 mmol/L (ref 135–145)

## 2020-11-07 MED ORDER — CISATRACURIUM BESYLATE (PF) 10 MG/5ML IV SOLN
13.0000 mg | Freq: Once | INTRAVENOUS | Status: AC
  Administered 2020-11-07: 06:00:00 13 mg via INTRAVENOUS
  Filled 2020-11-07: qty 6.5

## 2020-11-07 MED ORDER — PRISMASOL BGK 4/2.5 32-4-2.5 MEQ/L REPLACEMENT SOLN
Status: DC
  Filled 2020-11-07 (×2): qty 5000

## 2020-11-07 MED ORDER — "THROMBI-PAD 3""X3"" EX PADS"
1.0000 | MEDICATED_PAD | CUTANEOUS | Status: DC | PRN
  Filled 2020-11-07: qty 1

## 2020-11-07 MED ORDER — PRISMASOL BGK 4/2.5 32-4-2.5 MEQ/L REPLACEMENT SOLN
Status: DC
  Filled 2020-11-07 (×4): qty 5000

## 2020-11-07 MED ORDER — ALTEPLASE 2 MG IJ SOLR
2.0000 mg | Freq: Once | INTRAMUSCULAR | Status: AC | PRN
  Administered 2020-11-12: 02:00:00 2 mg

## 2020-11-07 MED ORDER — HEPARIN SODIUM (PORCINE) 1000 UNIT/ML DIALYSIS
1000.0000 [IU] | INTRAMUSCULAR | Status: DC | PRN
  Administered 2020-11-07: 17:00:00 1700 [IU] via INTRAVENOUS_CENTRAL
  Administered 2020-11-07: 17:00:00 1400 [IU] via INTRAVENOUS_CENTRAL
  Administered 2020-11-09: 04:00:00 1000 [IU] via INTRAVENOUS_CENTRAL
  Administered 2020-11-12: 15:00:00 3100 [IU] via INTRAVENOUS_CENTRAL
  Administered 2020-11-14 – 2020-11-15 (×2): 4000 [IU] via INTRAVENOUS_CENTRAL
  Administered 2020-11-17: 19:00:00 3100 [IU] via INTRAVENOUS_CENTRAL
  Administered 2020-11-17: 1000 [IU] via INTRAVENOUS_CENTRAL
  Filled 2020-11-07 (×10): qty 6

## 2020-11-07 MED ORDER — PRISMASOL BGK 4/2.5 32-4-2.5 MEQ/L EC SOLN
Status: DC
  Filled 2020-11-07 (×13): qty 5000

## 2020-11-07 MED ORDER — HEPARIN SODIUM (PORCINE) 5000 UNIT/ML IJ SOLN
7500.0000 [IU] | Freq: Three times a day (TID) | INTRAMUSCULAR | Status: DC
  Administered 2020-11-07 – 2020-11-11 (×15): 7500 [IU] via SUBCUTANEOUS
  Filled 2020-11-07 (×15): qty 2

## 2020-11-07 MED ORDER — SODIUM CHLORIDE 0.9 % FOR CRRT: INTRAVENOUS_CENTRAL | Status: DC | PRN

## 2020-11-07 NOTE — Progress Notes (Signed)
Nutrition Follow-up  DOCUMENTATION CODES:   Obesity unspecified  INTERVENTION:  - monitor for plan concerning nutrition support.  NUTRITION DIAGNOSIS:   Increased nutrient needs related to acute illness,catabolic illness (VHQIO-96 infection) as evidenced by estimated needs. -ongoing  GOAL:   Provide needs based on ASPEN/SCCM guidelines -unmet/unable to meet  MONITOR:   Vent status,Labs,Weight trends,Skin,Other (Comment) (plan concerning nutrition support)  ASSESSMENT:   59 year old male with no significant medical history. He was found to be COVID-19 positive on 11/26. He presented to [facility] with profound hypoxemia on 11/28 and was emergently intubated in the ED; found to be in ARDS and transferred to [facility].  Significant Events: 11/28 - admission; emergent intubation; OGT placed; transfer from [facility] to [facility] 11/29- trickle TF initiation; proned 11/30- initial RD assessment; proned 12/1- begin TF rate advancement; proning protocol stopped 12/3- TF held due to copious thick, tan secretions; re-started and advancing TF later in the day 12/5- TF held due to copious thick, tan secretions; re-started trickle TF later in the day; proned 12/6- proned; advancing TF rate 12/7- proned 12/8- TF held d/t large volume aspiration; proned; reglan started 12/14- TF increased from 10 ml/hr to 20 ml/hr   Patient remains intubated. OGT to LIS. Able to talk with RN who reports that TF placed on hold starting at 0800 for 24 hours d/t concern for recurrent aspiration.   Weight has been slowly trending up since 12/10. Moderate pitting edema to all extremities, face, perineal area, and sacral area documented in the edema section of flow sheet.   Per notes: - ARDS 2/2 COVID-19 PNA - worsening vent dyssynchrony thought to be 2/2 aspiration - plan to have OGT to LIS today and resume TF 12/17 via small bore NGT    Patient is currently intubated on ventilator support MV: 14.9  L/min Temp (24hrs), Avg:98 F (36.7 C), Min:96.08 F (35.6 C), Max:100.22 F (37.9 C) Propofol: none  Labs reviewed; CBGs: 83 and 85 mg/dl, BUN: 158 mg/dl, creatinine: 4.43 mg/dl, Ca: 8.1 mg/dl, GFR: 15 ml/min. Medications reviewed; 100 mg colace BID, 20 mg pepcid per tube/day, sliding scale novolog, 1 unit novolog every 4 hours, 5 units lantus/day, 5 mg tradjenta/day, 15 ml multivitamin/day, 17 g miralax/day, 10 g lokelma BID started 12/10, 100 mg thiamine/day.  IVF; D5 @ 75 ml/hr (306 kcal).  Drip; versed @ 8 mg/hr.     Diet Order:   Diet Order            Diet NPO time specified  Diet effective now                 EDUCATION NEEDS:   No education needs have been identified at this time  Skin:  Skin Assessment: Skin Integrity Issues: Skin Integrity Issues:: DTI,Unstageable DTI: sacrum Unstageable: full thickness to R nose  Last BM:  12/16 (type 7 x2)  Height:   Ht Readings from Last 1 Encounters:  10/29/20 6' (1.829 m)    Weight:   Wt Readings from Last 1 Encounters:  11/07/20 133.7 kg     Estimated Nutritional Needs:  Kcal:  2850 kcal (30 kcal/kg adjBW) Protein:  215-230 grams Fluid:  >/= 4 L/day     Jarome Matin, MS, RD, LDN, CNSC Inpatient Clinical Dietitian RD pager # available in [place]  After hours/weekend pager # available in [facility]

## 2020-11-07 NOTE — Plan of Care (Signed)
RN will continue to monitor patient's progression of care plan.  

## 2020-11-07 NOTE — Procedures (Signed)
Central Venous Catheter Insertion Procedure Note  Jackson Figueroa  [id number]  1961/06/03  Date:11/07/20  Time:5:06 PM   Provider Performing:Brent Delfino Friesen   Procedure: Insertion of Non-tunneled Central Venous Catheter(36556)with US guidance (12751)    Indication(s) Hemodialysis  Consent Risks of the procedure as well as the alternatives and risks of each were explained to the patient and/or caregiver.  Consent for the procedure was obtained and is signed in the bedside chart  Anesthesia Topical only with 1% lidocaine   Timeout Verified patient identification, verified procedure, site/side was marked, verified correct patient position, special equipment/implants available, medications/allergies/relevant history reviewed, required imaging and test results available.  Sterile Technique Maximal sterile technique including full sterile barrier drape, hand hygiene, sterile gown, sterile gloves, mask, hair covering, sterile ultrasound probe cover (if used).  Procedure Description Area of catheter insertion was cleaned with chlorhexidine and draped in sterile fashion.   With real-time ultrasound guidance a HD catheter was placed into the right internal jugular vein.  Nonpulsatile blood flow and easy flushing noted in all ports.  The catheter was sutured in place and sterile dressing applied.  Complications/Tolerance None; patient tolerated the procedure well. Chest X-ray is ordered to verify placement for internal jugular or subclavian cannulation.  Chest x-ray is not ordered for femoral cannulation.  EBL 5-10 cc  Specimen(s) None  Roselie Awkward, MD [place] [facility] Pager: [phone] Cell: [phone] If no response, call [phone]

## 2020-11-07 NOTE — TOC Progression Note (Signed)
Transition of Care [facility]) - Progression Note    Patient Details  Name: Huntley Knoop MRN: [id number] Date of Birth: January 03, 1961  Transition of [facility]) CM/SW Contact  Leeroy Cha, RN Phone Number: 11/07/2020, 1:54 PM  Clinical Narrative:    [place] [facility] Events   11/28 [facility] emergency department where he was promptly intubated for hypoxemia and airway protection. In the emergency department he was started on empiric antibiotics and developed shock requiring norepinephrine infusion. He was transferred to [facility] for ICU admission. Therapeutics for COVID-19 initiated include dexamethasone and remdesivir. 11/29 shock state persisted. Central line placed, ARDS protocol continued, proning initiated 11/30: Pressor requirements continued, neuromuscular blockade initiated to facilitate ventilation tube feeds started, initiated proning 12/1: Stopping neuromuscular blockade, proning positioning protocol discontinued, added free water for rising creatinine and sodium 12/2: PEEP trending down, maintaining pulse oximetry. Renal function improved. Changed to versed from prop gtt d/t rising triglycerides  12/3 Remdesivir now completed. Peep/FIO2 stable. Free water adjusted. Solumedrol changed to pred 40mg , changing fentanyl to Dilaudid, adding Oxycodone 12/4-5 continued dyssynchrony, given paralytic overnight, changed to ketamine; vomited and aspirated, restarted paralytics 12/6: proned, under NMB 12/7: proning, NMB continued. Switched antibiotics for stenotrophomonas 12/13 stopped oxycodone, added dilaudid oral, changed from continuous nimbex infusion to prn 12/14 changed to pressure control 12/15 worsening vent synchrony after clamping OG tube PLAN:  UNCLEAR DUE TO UNSTABLE CONDITION FOLLOWING FOR PROGRESSION.   Expected Discharge Plan: Home/Self Care    Expected Discharge Plan and Services Expected Discharge Plan: Home/Self Care   Discharge Planning Services:  CM Consult   Living arrangements for the past 2 months: Single Family Home                                       Social Determinants of Health (SDOH) Interventions    Readmission Risk Interventions No flowsheet data found.

## 2020-11-07 NOTE — Progress Notes (Signed)
[facility] PCCM  Updated family about worsening renal function, acidosis They would like for [place] to proceed with CVVHD  Roselie Awkward, MD [place] PCCM Pager: [phone] Cell: [phone] If no response, call [phone]

## 2020-11-07 NOTE — Progress Notes (Signed)
[place] for Infectious Disease   Reason for visit: positive Stenotrophomonas culture  Interval History: remains intubated.  Dyssynchrony on vent.    Physical Exam: Constitutional:  Vitals:   11/07/20 1000 11/07/20 1030  BP: 140/80   Pulse: 98 (!) 101  Resp: 15 14  Temp: (!) 97.52 F (36.4 C) 97.7 F (36.5 C)  SpO2: 96% 96%   patient appears in NAD Respiratory: +ET  Review of Systems: Unable to be assessed due to patient factors  Lab Results  Component Value Date   WBC 9.2 11/06/2020   HGB 9.4 (L) 11/06/2020   HCT 28.7 (L) 11/06/2020   MCV 102.9 (H) 11/06/2020   PLT 149 (L) 11/06/2020    Lab Results  Component Value Date   CREATININE 4.43 (H) 11/06/2020   BUN 158 (H) 11/06/2020   NA 138 11/06/2020   K 4.2 11/06/2020   CL 107 11/06/2020   CO2 18 (L) 11/06/2020    Lab Results  Component Value Date   ALT 26 11/06/2020   AST 19 11/06/2020   ALKPHOS 63 11/06/2020     Microbiology: Recent Results (from the past 240 hour(s))  MRSA PCR Screening     Status: None   Collection Time: 10/28/20 12:07 PM   Specimen: Nasal Mucosa; Nasopharyngeal  Result Value Ref Range Status   MRSA by PCR NEGATIVE NEGATIVE Final    Comment:        The GeneXpert MRSA Assay (FDA approved for NASAL specimens only), is one component of a comprehensive MRSA colonization surveillance program. It is not intended to diagnose MRSA infection nor to guide or monitor treatment for MRSA infections. Performed at [facility], [place] [address], [place], [place] [address]   Culture, respiratory (non-expectorated)     Status: None   Collection Time: 11/02/20  7:32 AM   Specimen: Tracheal Aspirate; Respiratory  Result Value Ref Range Status   Specimen Description   Final    TRACHEAL ASPIRATE Performed at [place] [address] [place], [place] [address]    Special Requests   Final    NONE Performed at [facility],  [place] [address] [place], [place] [address]    Gram Stain   Final    ABUNDANT WBC PRESENT, PREDOMINANTLY PMN MODERATE GRAM NEGATIVE RODS RARE GRAM POSITIVE COCCI Performed at [facility], [place] [address], [place], [place] [address]    Culture MODERATE STENOTROPHOMONAS MALTOPHILIA  Final   Report Status 11/05/2020 FINAL  Final   Organism ID, Bacteria STENOTROPHOMONAS MALTOPHILIA  Final      Susceptibility   Stenotrophomonas maltophilia - MIC*    LEVOFLOXACIN 1 SENSITIVE Sensitive     TRIMETH/SULFA <=20 SENSITIVE Sensitive     * MODERATE STENOTROPHOMONAS MALTOPHILIA  Culture, blood (routine x 2)     Status: None (Preliminary result)   Collection Time: 11/02/20  8:14 AM   Specimen: BLOOD  Result Value Ref Range Status   Specimen Description   Final    BLOOD LEFT ANTECUBITAL Performed at [place] [address] [place], [place] [address]    Special Requests   Final    BOTTLES DRAWN AEROBIC ONLY Blood Culture adequate volume Performed at [place] [address], [place], [place] [address]    Culture   Final    NO GROWTH 4 DAYS Performed at [facility], [place] [address], [place], [place] [address]    Report Status  PENDING  Incomplete  Culture, blood (routine x 2)     Status: None (Preliminary result)   Collection Time: 11/02/20  8:14 AM   Specimen: BLOOD  Result Value Ref Range Status   Specimen Description   Final    BLOOD BLOOD LEFT FOREARM Performed at [place] [address] [place], [place] [address]    Special Requests   Final    BOTTLES DRAWN AEROBIC ONLY Blood Culture adequate volume Performed at [place] [address] [place], [place] [address]    Culture   Final    NO GROWTH 4 DAYS Performed at [facility], [place] [address], [place], [place] [address]    Report Status PENDING  Incomplete  MRSA PCR Screening     Status: None   Collection Time:  11/02/20  9:56 AM   Specimen: Nasopharyngeal  Result Value Ref Range Status   MRSA by PCR NEGATIVE NEGATIVE Final    Comment:        The GeneXpert MRSA Assay (FDA approved for NASAL specimens only), is one component of a comprehensive MRSA colonization surveillance program. It is not intended to diagnose MRSA infection nor to guide or monitor treatment for MRSA infections. Performed at [facility], [place] [address], [place], [place] [address]   Culture, Urine     Status: None   Collection Time: 11/02/20 10:54 AM   Specimen: Urine, Random  Result Value Ref Range Status   Specimen Description   Final    URINE, RANDOM Performed at [place] [address] [place], [place] [address]    Special Requests   Final    NONE Performed at [facility], [place] [address] [place], [place] [address]    Culture   Final    NO GROWTH Performed at [facility], [place] [address], [place],  [address]    Report Status 11/04/2020 FINAL  Final    Impression/Plan:  1. Stenotrophomonas culture - possible pneumonia and completing treatment with levaquin today.  No further antibiotics indicated at this time.  No fever with Tmax of 100.2.   WBC yesterday was 9.2 .  I will sign off, call with any questions or changes.  thanks

## 2020-11-07 NOTE — Progress Notes (Signed)
[facility] Progress Note  Subjective:  Patient not examined today directly given COVID-19 + status, utilizing data taken from chart +/- discussions w/ providers and staff.  BUN/ creat climbing despite good UOP  Vitals:   11/07/20 1130 11/07/20 1200 11/07/20 1230 11/07/20 1300  BP:  (!) 145/79  (!) 144/76  Pulse: 98 96 99 97  Resp: _0 Temp: 97.88 F (36.6 C) 97.88 F (36.6 C) 97.88 F (36.6 C) 97.88 F (36.6 C)  TempSrc:  Bladder    SpO2: 96% 96% 96% 97%  Weight:      Height:        Exam:  Patient not examined today directly given COVID-19 + status, utilizing data taken from chart +/- discussions w/ providers and staff.      IV abx:  Vancomycin 12/5-12/6 and 12/11 x 1  Levaquin 12/7- 12/10 Cefepime 12/5 -12/7 and 12/11- current   UA >50 rbc, 11-20 wbc, rare bact, 30 prot   Renal US - IMPRESSION: Normal kidneys.  Bladder not visualized.    UNa 19, UCr 49     CXR - 12/15 > IMPRESSION: Lower lung volumes with bilateral COVID-19 pneumonia mildly improved compared to 11/02/2020. No new cardiopulmonary abnormality.  Assessment/ Plan: 1. Renal failure - acute kidney injury, nonoliguric w/ rising creat / BUN due to shock +/-  hemodynamics/ ^PEEP effects +/-COVID-19 effects.  . [place] w/o hydro, normal appearing kidneys. UA +hematuria/ pyuria. Creat 1.18 on admit 11/28 , worsening 12/11 to 2.68 > 3.20 > 3.5 > 4.1 > 4.4 > and 5.4 today w/ BUN 178.  Will need RRT if family wants everything done, have d/w CCM. Will write orders, CCM will d/w family.  2. BP/ volume - was on pressors 11-04-11/13, now off. Moderate edema on exam, up 12 kg from admit. Making urine in good amts.  3. COVID pna/ ARDS - per CCM, mult infiltrates on CXR 4. Diarrhea - rectal tube 5. Stenotrophomonas PNA - per CCM     Rob Yauco 11/07/2020, 2:44 PM   Recent Labs  Lab 11/02/20 0630 11/03/20 0420 11/05/20 0515 11/06/20 0316 11/07/20 1030  K 4.9   < > 4.3 4.2 4.8  BUN 111*    < > 152* 158* 178*  CREATININE 2.68*   < > 4.14* 4.43* 5.41*  CALCIUM 8.7*   < > 8.1* 8.1* 8.2*  PHOS 6.4*  --   --   --   --   HGB 11.3*   < > 9.4* 9.4*  --    < > = values in this interval not displayed.   Inpatient medications:  chlorhexidine gluconate (MEDLINE KIT)  15 mL Mouth Rinse BID   Chlorhexidine Gluconate Cloth  6 each Topical Q0600   clonazePAM  2 mg Per Tube Q8H   collagenase   Topical Daily   docusate  100 mg Per Tube BID   famotidine  20 mg Per Tube Daily   feeding supplement (PIVOT 1.5 CAL)  1,000 mL Per Tube Q24H   feeding supplement (PROSource TF)  90 mL Per Tube QID   heparin injection (subcutaneous)  7,500 Units Subcutaneous Q8H   HYDROmorphone HCl  3 mg Oral Q4H   insulin aspart  0-15 Units Subcutaneous Q4H   insulin aspart  1 Units Subcutaneous Q4H   insulin glargine  5 Units Subcutaneous Daily   linagliptin  5 mg Per Tube Daily   mouth rinse  15 mL Mouth Rinse 10 times per day  multivitamin  15 mL Per Tube Daily  °• polyethylene glycol  17 g Per Tube Daily  °• sodium chloride flush  10-40 mL Intracatheter Q12H  °• sodium zirconium cyclosilicate  10 g Per Tube BID  °• thiamine  100 mg Per Tube Daily  ° °• sodium chloride 10 mL/hr at 11/04/20 0800  °• dextrose 75 mL/hr at 11/07/20 1300  °• HYDROmorphone 4 mg/hr (11/07/20 1300)  °• midazolam 8 mg/hr (11/07/20 1300)  ° °sodium chloride, acetaminophen (TYLENOL) oral liquid 160 mg/5 mL, HYDROmorphone, lip balm, midazolam, sodium chloride flush ° ° ° ° ° ° °

## 2020-11-07 NOTE — Progress Notes (Signed)
CRITICAL VALUE ALERT  Critical Value:  PH 7.160  Date & Time Notied:  11/07/20, 1023  Provider Notified: Dr. Lake Bells  Orders Received/Actions taken: Awaiting further orders at this time; MD reviewing labs. RN will continue to monitor.

## 2020-11-07 NOTE — Progress Notes (Signed)
HD cath successfully placed by Dr. Lake Bells, without complication. Both lumens were heparin locked per orders; CRRT will be started at 1900. This RN will continue to carefully monitor pt.

## 2020-11-07 NOTE — Progress Notes (Addendum)
NAME:  Jackson Figueroa, MRN:  [id number], DOB:  [birth year], LOS: 63 ADMISSION DATE:  10/20/2020, CONSULTATION DATE:  11/29 REFERRING MD:  Sabra Heck, CHIEF COMPLAINT:  ARDS COVID 60   Brief History   59 year old male with no significant past medical history and a positive COVID-19 test on 11/26 presented to [facility] with profound hypoxemia on 11/28.  Intubated immediately upon arrival found to be in ARDS.  Transferred to [facility] for ongoing ICU care in the early a.m. hours of 11/29.  Past Medical History  obesity  Significant Hospital Events   11/28 [facility] emergency department where he was promptly intubated for hypoxemia and airway protection.  In the emergency department he was started on empiric antibiotics and developed shock requiring norepinephrine infusion.  He was transferred to [facility] for ICU admission.  Therapeutics for COVID-19 initiated include dexamethasone and remdesivir. 11/29 shock state persisted. Central line placed, ARDS protocol continued, proning initiated 11/30: Pressor requirements continued, neuromuscular blockade initiated to facilitate ventilation tube feeds started, initiated proning 12/1: Stopping neuromuscular blockade, proning positioning protocol discontinued, added free water for rising creatinine and sodium 12/2: PEEP trending down, maintaining pulse oximetry.  Renal function improved. Changed to versed from prop gtt d/t rising triglycerides  12/3 Remdesivir now completed. Peep/FIO2 stable. Free water adjusted. Solumedrol changed to pred 40mg , changing fentanyl to Dilaudid, adding Oxycodone  12/4-5 continued dyssynchrony, given paralytic overnight, changed to ketamine; vomited and aspirated, restarted paralytics 12/6: proned, under NMB 12/7: proning, NMB continued. Switched antibiotics for stenotrophomonas 12/13 stopped oxycodone, added dilaudid oral, changed from continuous nimbex infusion to prn, palliative care  consulted 12/14 changed to pressure control 12/15 worsening vent synchrony after clamping OG tube  Consults:  Nephrology 12/14 Palliative 12/13 ID  Procedures:  ETT 11/28 > CVL 11/29 >12/9 R PICC 12/9 >   Significant Diagnostic Tests:  Echo 11/29 > LVEF 50-55%, mild LVH, RV systolic function mildly reduced. Mild dilation of ascending aorta 57mm. 12/8 Lower extre doppler ultrasound > negative for DVT  Micro Data:  Blood 11/29 > Urine 11/28 >  SARS COV2/Flu > negative  12/5 resp> few GPC> stenotrophomonas> sensitive to Bactrim & levofloxacin  12/11 respiratory> stenotrophamonas 12/11 blood> 12/11 urine> negative  Antimicrobials:  CTX 11/28 > 11/29 Azithromycin 11/28 > 11/29  remdesivir 11/28 > 12/2 Prednisone 11/28 >  baricitinib 12/4 > 12/5   12/5 vanc > 12/7, restarted 12/11 x1 12/5 cefepime > 12/7, restarted 12/11 >12/13 12/7 levofloxacin> 12/16  Interim history/subjective:   Worsening ventilator synchrony last night Increased secretions  Objective   Blood pressure 138/81, pulse (!) 101, temperature (!) 96.98 F (36.1 C), resp. rate 17, height 6' (1.829 m), weight 133.7 kg, SpO2 94 %.    Vent Mode: PRVC FiO2 (%):  [40 %-60 %] 60 % Set Rate:  [15 bmp-26 bmp] 26 bmp Vt Set:  [540 mL] 540 mL PEEP:  [8 cmH20] 8 cmH20 Plateau Pressure:  [19 cmH20-27 cmH20] 19 cmH20   Intake/Output Summary (Last 24 hours) at 11/07/2020 0803 Last data filed at 11/07/2020 0714 Gross per 24 hour  Intake 2958 ml  Output 2575 ml  Net 383 ml   Filed Weights   11/06/20 0500 11/06/20 0645 11/07/20 0500  Weight: 131.6 kg 131.6 kg 133.7 kg    Examination:  General:  In bed on vent HENT: NCAT ETT in place PULM: Crackles bases, vent supported breathing CV: RRR, no mgr GI: BS+, soft, nontender MSK: normal bulk and tone Neuro: sedated on  vent   12/15 CXR images reviewed> improvement in bilateral airspace disease  Resolved Hospital Problem list     Assessment & Plan:   ARDS due to COVID 19 pneumonia> worsening dyssynchrony again on 12/15 likely due to aspiration (NG clamped, increased oral secretions, increased rhonchi) Hoping to avoid tracheostomy as he has been on 8/8 this week and doing well Full mechanical vent support VAP prevention Daily WUA/SBT 12/16 plan: back to pressure control today, check ABG, hold tube feeding  Stenotrophomonas pneumonia, persistent on repeat culture; improvement clinically as of 12/14 Appreciate ID support> stopping levaquin today  Severe ventilator dyssynchrony Acute metabolic encephalopathy RASS target -1 to -2 Dilaudid, versed infusions per PAD Oral dilaudid Oral clonazepam  Vomiting> slow GI transit Nutrition Hold tube feeding 12/16 due to likely aspiration OG back to suction Plan resume panda on 12/17  Hyperglycemia SSI, Glargine, linagliptin  Non-oliguric AKI > worsening labs but overall picture improving Monitor BMET and UOP Replace electrolytes as needed F/u BMET from today  Diarrhea Monitor for fever, WBC elevation  Goals of care: I tried to call her sister and girlfriend today, no answer   Best practice (evaluated daily)   Diet: tube feeding Pain/Anxiety/Delirium protocol (if indicated): as above VAP protocol (if indicated): yes DVT prophylaxis: lovenox GI prophylaxis: famotidine Glucose control: as above Mobility: bed rest last date of multidisciplinary goals of care discussion 12/14 with sister,  Family and staff present n/a Summary of discussion n/a Follow up goals of care discussion due 12/15 Code Status: full Disposition: remain in ICU  Labs   CBC: Recent Labs  Lab 11/02/20 0630 11/03/20 0420 11/04/20 0600 11/05/20 0515 11/06/20 0316  WBC 16.0* 19.0* 11.4* 8.6 9.2  NEUTROABS  --   --   --  6.7 7.0  HGB 11.3* 11.8* 10.4* 9.4* 9.4*  HCT 36.6* 38.5* 33.8* 30.3* 28.7*  MCV 106.1* 106.4* 108.0* 103.8* 102.9*  PLT 208 214 169 144* 149*    Basic Metabolic Panel: Recent  Labs  Lab 11/02/20 0630 11/03/20 0420 11/04/20 0600 11/05/20 0515 11/06/20 0316  NA 143 144 141 140 138  K 4.9 5.1 5.3* 4.3 4.2  CL 111 110 108 109 107  CO2 21* 21* 19* 18* 18*  GLUCOSE 212* 130* 108* 113* 110*  BUN 111* 136* 150* 152* 158*  CREATININE 2.68* 3.20* 3.53* 4.14* 4.43*  CALCIUM 8.7* 8.7* 8.2* 8.1* 8.1*  PHOS 6.4*  --   --   --   --    GFR: Estimated Creatinine Clearance: 26 mL/min (A) (by C-G formula based on SCr of 4.43 mg/dL (H)). Recent Labs  Lab 11/03/20 0420 11/04/20 0600 11/05/20 0515 11/06/20 0316  WBC 19.0* 11.4* 8.6 9.2    Liver Function Tests: Recent Labs  Lab 11/05/20 0515 11/06/20 0316  AST 18 19  ALT 29 26  ALKPHOS 60 63  BILITOT 0.6 0.6  PROT 5.9* 6.5  ALBUMIN 1.9* 2.0*   No results for input(s): LIPASE, AMYLASE in the last 168 hours. No results for input(s): AMMONIA in the last 168 hours.  ABG    Component Value Date/Time   PHART 7.206 (L) 11/07/2020 0425   PCO2ART 45.3 11/07/2020 0425   PO2ART 146 (H) 11/07/2020 0425   HCO3 17.3 (L) 11/07/2020 0425   ACIDBASEDEF 9.7 (H) 11/07/2020 0425   O2SAT 98.2 11/07/2020 0425     Coagulation Profile: Recent Labs  Lab 11/02/20 0956  INR 1.2    Cardiac Enzymes: No results for input(s): CKTOTAL, CKMB, CKMBINDEX, TROPONINI in the  last 168 hours.  HbA1C: Hgb A1c MFr Bld  Date/Time Value Ref Range Status  10/22/2020 03:38 AM 5.3 4.8 - 5.6 % Final    Comment:    (NOTE)         Prediabetes: 5.7 - 6.4         Diabetes: >6.4         Glycemic control for adults with diabetes: <7.0     CBG: Recent Labs  Lab 11/06/20 1529 11/06/20 2030 11/06/20 2343 11/07/20 0411 11/07/20 0724  GLUCAP 126* 138* 141* 83 85     Critical care time: 35 minutes    Roselie Awkward, MD [place] PCCM Pager: [phone] Cell: [phone] If no response, call [phone]

## 2020-11-07 NOTE — Progress Notes (Signed)
Tube feeds stopped at 0800 d/t severe rhonchi and secretions both orally and from airway. Dr Lake Bells notified by this RN; orders received to continue holding TF until tomorrow morning, and keep NGT to LIS. This RN will continue to monitor.

## 2020-11-07 NOTE — Progress Notes (Signed)
[place] Progress Note Patient Name: Jackson Figueroa DOB: [birth year] MRN: [id number]   Date of Service  11/07/2020  HPI/Events of Note  Severe ventilator dyssynchrony after bath. Patient is RASS -5.   eICU Interventions  Nimbex 13mg  IV bolus ordered to help restore synchrony.      Intervention Category Major Interventions: Respiratory failure - evaluation and management  Marily Lente Ahmiyah Coil 11/07/2020, 5:08 AM

## 2020-11-08 ENCOUNTER — Inpatient Hospital Stay (HOSPITAL_COMMUNITY): Payer: BC Managed Care – PPO

## 2020-11-08 LAB — CBC WITH DIFFERENTIAL/PLATELET
Abs Immature Granulocytes: 0.14 10*3/uL — ABNORMAL HIGH (ref 0.00–0.07)
Basophils Absolute: 0 10*3/uL (ref 0.0–0.1)
Basophils Relative: 0 %
Eosinophils Absolute: 0.1 10*3/uL (ref 0.0–0.5)
Eosinophils Relative: 1 %
HCT: 28.4 % — ABNORMAL LOW (ref 39.0–52.0)
Hemoglobin: 9 g/dL — ABNORMAL LOW (ref 13.0–17.0)
Immature Granulocytes: 2 %
Lymphocytes Relative: 4 %
Lymphs Abs: 0.4 10*3/uL — ABNORMAL LOW (ref 0.7–4.0)
MCH: 32.1 pg (ref 26.0–34.0)
MCHC: 31.7 g/dL (ref 30.0–36.0)
MCV: 101.4 fL — ABNORMAL HIGH (ref 80.0–100.0)
Monocytes Absolute: 1.1 10*3/uL — ABNORMAL HIGH (ref 0.1–1.0)
Monocytes Relative: 11 %
Neutro Abs: 7.9 10*3/uL — ABNORMAL HIGH (ref 1.7–7.7)
Neutrophils Relative %: 82 %
Platelets: 128 10*3/uL — ABNORMAL LOW (ref 150–400)
RBC: 2.8 MIL/uL — ABNORMAL LOW (ref 4.22–5.81)
RDW: 14.2 % (ref 11.5–15.5)
WBC: 9.6 10*3/uL (ref 4.0–10.5)
nRBC: 0 % (ref 0.0–0.2)

## 2020-11-08 LAB — GLUCOSE, CAPILLARY
Glucose-Capillary: 100 mg/dL — ABNORMAL HIGH (ref 70–99)
Glucose-Capillary: 101 mg/dL — ABNORMAL HIGH (ref 70–99)
Glucose-Capillary: 102 mg/dL — ABNORMAL HIGH (ref 70–99)
Glucose-Capillary: 106 mg/dL — ABNORMAL HIGH (ref 70–99)
Glucose-Capillary: 108 mg/dL — ABNORMAL HIGH (ref 70–99)
Glucose-Capillary: 83 mg/dL (ref 70–99)
Glucose-Capillary: 86 mg/dL (ref 70–99)

## 2020-11-08 LAB — HEPATIC FUNCTION PANEL
ALT: 24 U/L (ref 0–44)
AST: 24 U/L (ref 15–41)
Albumin: 2.2 g/dL — ABNORMAL LOW (ref 3.5–5.0)
Alkaline Phosphatase: 64 U/L (ref 38–126)
Bilirubin, Direct: 0.1 mg/dL (ref 0.0–0.2)
Indirect Bilirubin: 0.5 mg/dL (ref 0.3–0.9)
Total Bilirubin: 0.6 mg/dL (ref 0.3–1.2)
Total Protein: 6.7 g/dL (ref 6.5–8.1)

## 2020-11-08 LAB — RENAL FUNCTION PANEL
Albumin: 2.2 g/dL — ABNORMAL LOW (ref 3.5–5.0)
Albumin: 2.1 g/dL — ABNORMAL LOW (ref 3.5–5.0)
Anion gap: 13 (ref 5–15)
Anion gap: 8 (ref 5–15)
BUN: 142 mg/dL — ABNORMAL HIGH (ref 6–20)
BUN: 99 mg/dL — ABNORMAL HIGH (ref 6–20)
CO2: 18 mmol/L — ABNORMAL LOW (ref 22–32)
CO2: 23 mmol/L (ref 22–32)
Calcium: 7.8 mg/dL — ABNORMAL LOW (ref 8.9–10.3)
Calcium: 8 mg/dL — ABNORMAL LOW (ref 8.9–10.3)
Chloride: 104 mmol/L (ref 98–111)
Chloride: 103 mmol/L (ref 98–111)
Creatinine, Ser: 3.77 mg/dL — ABNORMAL HIGH (ref 0.61–1.24)
Creatinine, Ser: 3.08 mg/dL — ABNORMAL HIGH (ref 0.61–1.24)
GFR, Estimated: 18 mL/min — ABNORMAL LOW (ref >60)
GFR, Estimated: 23 mL/min — ABNORMAL LOW (ref >60)
Glucose, Bld: 88 mg/dL (ref 70–99)
Glucose, Bld: 115 mg/dL — ABNORMAL HIGH (ref 70–99)
Phosphorus: 7.9 mg/dL — ABNORMAL HIGH (ref 2.5–4.6)
Phosphorus: 5.2 mg/dL — ABNORMAL HIGH (ref 2.5–4.6)
Potassium: 4.1 mmol/L (ref 3.5–5.1)
Potassium: 3.8 mmol/L (ref 3.5–5.1)
Sodium: 135 mmol/L (ref 135–145)
Sodium: 134 mmol/L — ABNORMAL LOW (ref 135–145)

## 2020-11-08 LAB — CULTURE, BLOOD (ROUTINE X 2)
Culture: NO GROWTH
Culture: NO GROWTH
Special Requests: ADEQUATE
Special Requests: ADEQUATE

## 2020-11-08 LAB — APTT: aPTT: 39 seconds — ABNORMAL HIGH (ref 24–36)

## 2020-11-08 LAB — MAGNESIUM: Magnesium: 3 mg/dL — ABNORMAL HIGH (ref 1.7–2.4)

## 2020-11-08 MED ORDER — PIVOT 1.5 CAL PO LIQD
1000.0000 mL | ORAL | Status: DC
  Filled 2020-11-08: qty 1000

## 2020-11-08 MED ORDER — AMIODARONE IV BOLUS ONLY 150 MG/100ML: 150.0000 mg | Freq: Once | INTRAVENOUS | Status: DC

## 2020-11-08 NOTE — Progress Notes (Signed)
[facility] Progress Note  Subjective:  Pt seen in ICU, on CRRT.  Not responsive.   Vitals:   11/08/20 0700 11/08/20 0745 11/08/20 0800 11/08/20 0900  BP: (!) 143/64  138/64 133/61  Pulse: 92  96 98  Resp: 20  (!) 21 20  Temp: 98.06 F (36.7 C)  98.42 F (36.9 C) 99.14 F (37.3 C)  TempSrc:   Bladder   SpO2: 91% 91% 91% 93%  Weight:      Height:        Exam: on vent ,sedated  no jvd  throat ett in place  Chest cta bilat and lat  Cor reg no RG  Abd soft ntnd no ascites   Ext diffuse 1-2+ edema   Neuro on vent and sedated      IV abx:  Vancomycin 12/5-12/6 and 12/11 x 1  Levaquin 12/7- 12/10 Cefepime 12/5 -12/7 and 12/11- current   UA >50 rbc, 11-20 wbc, rare bact, 30 prot   Renal US - IMPRESSION: Normal kidneys.  Bladder not visualized.    UNa 19, UCr 49     CXR - 12/15 > IMPRESSION: Lower lung volumes with bilateral COVID-19 pneumonia mildly improved compared to 11/02/2020. No new cardiopulmonary abnormality     Wt's = admit 120kg, peak 135kg, today 135kg     UOP yest 2.2 L, I/O -1.4 net, +GI losses too  Assessment/ Plan: 1. Renal failure - acute kidney injury, nonoliguric w/ rising creat / BUN due to shock +/-  hemodynamics/ ^PEEP effects +/-COVID-19 effects. [place] w/o hydro, normal appearing kidneys. UA +hematuria/ pyuria. Creat 1.1 on admit, worsening up to 5.4 w/ BUN 178 on 12/16 and pt started on CRRT. w/ BUN 178.  Labs improving as expected. Cont CRRT.  2. BP/ volume - off pressors, BP's wnl. Tolerating UF, ^ goal to 150- 200 cc/ hr today. Try to dry out the lungs some.  3. COVID pna/ ARDS - per CCM, mult infiltrates on CXR, CXR worse w/ vol up. As above.  4. Diarrhea - rectal tube 5. Stenotrophomonas PNA - per CCM     Rob Maskell 11/08/2020, 9:24 AM   Recent Labs  Lab 11/06/20 0316 11/07/20 1030 11/07/20 1718 11/08/20 0513  K 4.2   < > 5.3* 4.1  BUN 158*   < > 179* 142*  CREATININE 4.43*   < > 5.44* 3.77*  CALCIUM 8.1*   < >  8.1* 7.8*  PHOS  --   --  13.1* 7.9*  HGB 9.4*  --   --  9.0*   < > = values in this interval not displayed.   Inpatient medications: . chlorhexidine gluconate (MEDLINE KIT)  15 mL Mouth Rinse BID  . Chlorhexidine Gluconate Cloth  6 each Topical Q0600  . clonazePAM  2 mg Per Tube Q8H  . collagenase   Topical Daily  . docusate  100 mg Per Tube BID  . famotidine  20 mg Per Tube Daily  . feeding supplement (PIVOT 1.5 CAL)  1,000 mL Per Tube Q24H  . feeding supplement (PROSource TF)  90 mL Per Tube QID  . heparin injection (subcutaneous)  7,500 Units Subcutaneous Q8H  . HYDROmorphone HCl  3 mg Oral Q4H  . insulin aspart  0-15 Units Subcutaneous Q4H  . insulin aspart  1 Units Subcutaneous Q4H  . insulin glargine  5 Units Subcutaneous Daily  . linagliptin  5 mg Per Tube Daily  . mouth rinse  15 mL Mouth Rinse 10 times  per day  . multivitamin  15 mL Per Tube Daily  . polyethylene glycol  17 g Per Tube Daily  . sodium chloride flush  10-40 mL Intracatheter Q12H  . sodium zirconium cyclosilicate  10 g Per Tube BID  . thiamine  100 mg Per Tube Daily   .  prismasol BGK 4/2.5 400 mL/hr at 11/08/20 0912  .  prismasol BGK 4/2.5 200 mL/hr at 11/08/20 0528  . sodium chloride 10 mL/hr at 11/04/20 0800  . dextrose 75 mL/hr at 11/08/20 0900  . HYDROmorphone 4 mg/hr (11/08/20 0900)  . midazolam Stopped (11/08/20 0752)  . prismasol BGK 4/2.5 1,800 mL/hr at 11/08/20 0824   sodium chloride, acetaminophen (TYLENOL) oral liquid 160 mg/5 mL, alteplase, heparin, HYDROmorphone, lip balm, midazolam, sodium chloride, sodium chloride flush, Thrombi-Pad

## 2020-11-08 NOTE — Progress Notes (Addendum)
[place] Progress Note Patient Name: Jackson Figueroa DOB: Sep 23, 1961 MRN: [id number]   Date of Service  11/08/2020  HPI/Events of Note  Patient with new onset Afib with heart rate between 117 and 140. BP 99/69.  eICU Interventions  Amiodarone 150 mg iv x 1 to try to terminate the rhythm. Patient went back into sinus rhythm before the Amiodarone could be given so it will be canceled.        [name] 11/08/2020, 10:11 PM

## 2020-11-08 NOTE — Progress Notes (Addendum)
NAME:  Jackson Figueroa, MRN:  [id number], DOB:  15-Apr-1961, LOS: 40 ADMISSION DATE:  10/20/2020, CONSULTATION DATE:  11/29 REFERRING MD:  Sabra Heck, CHIEF COMPLAINT:  ARDS COVID 20   Brief History   59 year old male with no significant past medical history and a positive COVID-19 test on 11/26 presented to [facility] with profound hypoxemia on 11/28.  Intubated immediately upon arrival found to be in ARDS. Transferred to [facility] hospital for ongoing ICU care in the early a.m. hours of 11/29.  Past Medical History  Obesity  Significant Hospital Events   11/28 Presented to [facility] ER, intubated for hypoxemia, empiric abx, shock on levo. COVID Rx 11/29 Persistent shock, central line placed, ARDS protocol continued, proning initiated 11/30 Pressor requirements continued, NMB initiated to facilitate ventilation tube feeds started, initiated proning 12/01 Stopping NMB, proning positioning protocol discontinued, free water for rising creatinine and sodium 12/02 PEEP trending down.  Renal function improved. Changed to versed from prop gtt d/t rising triglycerides  12/03 Remdesivir completed. Peep/FIO2 stable. Free water adjusted. Solumedrol reduced, fentanyl to Dilaudid, adding Oxycodone  12/04 Dyssynchrony, given paralytic overnight, changed to ketamine; vomited / aspirated, restarted paralytics 12/06 Proned, under NMB 12/07 Proning, NMB continued. Switched antibiotics for stenotrophomonas 12/13 Stopped oxycodone, added dilaudid oral, continuous nimbex infusion to prn, palliative care consulted 12/14 Changed to pressure control 12/15 Worsening vent synchrony after clamping OG tube  Consults:  Nephrology 12/14 Palliative 12/13 ID  Procedures:  ETT 11/28 >> CVL 11/29 >> 12/9 R PICC 12/9 >>  R IJ HD Cath 12/17 >>  Significant Diagnostic Tests:  ECHO 11/29 >> LVEF 50-55%, mild LVH, RV systolic function mildly reduced. Mild dilation of ascending aorta 59mm. LE Venous Duplex 12/8 >>  negative for DVT   Micro Data:  COVID 11/28 >> Positive  Influenza A/B 11/28 >> negative  UC 11/28 >> negative  BCx2 11/28 >> negative  Tracheal aspirate 12/5 >> Stenotrophomonas maltophilia >> S-bactrim, levofloxacin UC 12/11 >> negative  Tracheal aspirate 12/11 >> Stenotrophomonas maltophilia >> S-bactrim, levofloxacin  BCx2 12/11 >> negative   Antimicrobials:  CTX 11/28 > 11/29 Azithromycin 11/28 > 11/29 Remdesivir 11/28 > 12/2 Prednisone 11/28 >  Baricitinib 12/4 > 12/5 Vanco 12/5 >> 12/7, 12/11 x1 Cefepime 12/5 >> 12/7, 12/11 >> 12/13 Levaquin 12/7 >> 12/16  Interim history/subjective:  RN reports sedation was stopped for WUA overnight > pt became dyssynchronous after 1.5 hour and gtt's restarted Tmax 99 / WBC 9.6  Glucose range 86-100 Vent - 40% FiO2, PEEP 8 UOP 2.2L, HD 2.1L removed, net -3L in last 24 hours   Objective   Blood pressure 133/61, pulse 98, temperature 99.14 F (37.3 C), resp. rate 20, height 6' (1.829 m), weight 135.4 kg, SpO2 93 %.    Vent Mode: PCV FiO2 (%):  [40 %-60 %] 40 % Set Rate:  [15 bmp] 15 bmp PEEP:  [8 cmH20] 8 cmH20   Intake/Output Summary (Last 24 hours) at 11/08/2020 1003 Last data filed at 11/08/2020 1000 Gross per 24 hour  Intake 2476.92 ml  Output 5465 ml  Net -2988.08 ml   Filed Weights   11/06/20 0645 11/07/20 0500 11/08/20 0500  Weight: 131.6 kg 133.7 kg 135.4 kg    Examination: General: critically ill appearing adult male lying in bed on vent in NAD   HEENT: MM pink/moist, ETT, pupils 58mm, anicteric  Neuro: sedate on versed, dilaudid, mild fluttering of eye lids when name called  CV: s1s2 RRR, no m/r/g PULM:  Non-labored  on vent, PCV with Vt ~ 72ml, lungs bilaterally with good air movement anterior, bibasilar crackles GI: soft, bsx4 active  Extremities: warm/dry, generalized 1-2+ edema  Skin: no rashes or lesions  PCXR 12/17 >> images personally reviewed, ETT in good position, PICC / HD cath in position,  bilateral patchy airspace opacities unchanged  Resolved Hospital Problem list     Assessment & Plan:   ARDS due to COVID 19 PNA Intermittent periods of worsening dyssynchrony, most recently on 12/15 in the setting of aspiration.   -PCV as tolerated  -target PaO2 55-65, titrate PEEP/FiO2 per ARDS protocol  -goal CVP <4, diuresis as necessary -VAP prevention measures  -follow intermittent CXR  -daily WUA / SBT as tolerated  -likely will need trach next week, given progress consider scheduling with ENT on Wednesday if still needed.  Hopeful that with volume removal he will be able to do the work of breathing and avoid tracheostomy.   Stenotrophomonas pneumonia, persistent on repeat culture; improvement clinically as of 12/14 -appreciate ID evaluation -monitor off abx -if new fever > 101.5, repeat cultures  Severe ventilator dyssynchrony Acute metabolic encephalopathy -RASS goal -1 to -2 -continue dilaudid gtt, versed per PAD protocol  -klonopin PT   Vomiting, slow GI transit Nutrition -TF restarted 12/17, no further vomiting -OGT to suction   Hyperglycemia -SSI, moderate scale  -continue 1 unit SQ Q4 hours  -glargine 5 units QD  -linagliptin PT   Non-oliguric AKI   Worsening labs 12/16, but overall picture improving.  CVVHD initiated.   -appreciate Nephrology  -Trend BMP / urinary output -Replace electrolytes as indicated -Avoid nephrotoxic agents, ensure adequate renal perfusion  Diarrhea -monitor fever curve, WBC trend  Sister called for update 12/17  Best practice (evaluated daily)  Diet: tube feeding Pain/Anxiety/Delirium protocol (if indicated): as above VAP protocol (if indicated): yes DVT prophylaxis: lovenox GI prophylaxis: famotidine Glucose control: as above Mobility: bed rest Last date of multidisciplinary goals of care discussion 12/14 with sister.   Family and staff present n/a Summary of discussion n/a Follow up goals of care discussion due:  12/22 Code Status: full code  Disposition: ICU  Labs   CBC: Recent Labs  Lab 11/03/20 0420 11/04/20 0600 11/05/20 0515 11/06/20 0316 11/08/20 0513  WBC 19.0* 11.4* 8.6 9.2 9.6  NEUTROABS  --   --  6.7 7.0 7.9*  HGB 11.8* 10.4* 9.4* 9.4* 9.0*  HCT 38.5* 33.8* 30.3* 28.7* 28.4*  MCV 106.4* 108.0* 103.8* 102.9* 101.4*  PLT 214 169 144* 149* 128*    Basic Metabolic Panel: Recent Labs  Lab 11/02/20 0630 11/03/20 0420 11/05/20 0515 11/06/20 0316 11/07/20 1030 11/07/20 1718 11/08/20 0513  NA 143   < > 140 138 135 135 135  K 4.9   < > 4.3 4.2 4.8 5.3* 4.1  CL 111   < > 109 107 105 104 104  CO2 21*   < > 18* 18* 15* 16* 18*  GLUCOSE 212*   < > 113* 110* 124* 126* 88  BUN 111*   < > 152* 158* 178* 179* 142*  CREATININE 2.68*   < > 4.14* 4.43* 5.41* 5.44* 3.77*  CALCIUM 8.7*   < > 8.1* 8.1* 8.2* 8.1* 7.8*  MG  --   --   --   --   --   --  3.0*  PHOS 6.4*  --   --   --   --  13.1* 7.9*   < > = values in this interval not  displayed.   GFR: Estimated Creatinine Clearance: 30.8 mL/min (A) (by C-G formula based on SCr of 3.77 mg/dL (H)). Recent Labs  Lab 11/04/20 0600 11/05/20 0515 11/06/20 0316 11/08/20 0513  WBC 11.4* 8.6 9.2 9.6    Liver Function Tests: Recent Labs  Lab 11/05/20 0515 11/06/20 0316 11/07/20 1718 11/08/20 0513  AST 18 19  --  24  ALT 29 26  --  24  ALKPHOS 60 63  --  64  BILITOT 0.6 0.6  --  0.6  PROT 5.9* 6.5  --  6.7  ALBUMIN 1.9* 2.0* 2.3* 2.2*  2.2*   No results for input(s): LIPASE, AMYLASE in the last 168 hours. No results for input(s): AMMONIA in the last 168 hours.  ABG    Component Value Date/Time   PHART 7.160 (LL) 11/07/2020 1023   PCO2ART 47.2 11/07/2020 1023   PO2ART 111 (H) 11/07/2020 1023   HCO3 16.1 (L) 11/07/2020 1023   ACIDBASEDEF 11.9 (H) 11/07/2020 1023   O2SAT 96.8 11/07/2020 1023     Coagulation Profile: Recent Labs  Lab 11/02/20 0956  INR 1.2    Cardiac Enzymes: No results for input(s): CKTOTAL, CKMB,  CKMBINDEX, TROPONINI in the last 168 hours.  HbA1C: Hgb A1c MFr Bld  Date/Time Value Ref Range Status  10/22/2020 03:38 AM 5.3 4.8 - 5.6 % Final    Comment:    (NOTE)         Prediabetes: 5.7 - 6.4         Diabetes: >6.4         Glycemic control for adults with diabetes: <7.0     CBG: Recent Labs  Lab 11/07/20 1502 11/07/20 2102 11/07/20 2359 11/08/20 0337 11/08/20 0845  GLUCAP 124* 77 83 86 100*     Critical care time: 34 minutes    Noe Gens, MSN, NP-C, AGACNP-BC [facility] 11/08/2020, 11:18 AM   Please see [email or web address] for pager details.

## 2020-11-08 NOTE — Progress Notes (Signed)
Pt converted to A-fib HR 130-140's, ECG obtained, E-link notified. Stopped pulling as much fluid off as BP's also started going down. Now pulling 200 instead of 300. Pt HR back in 90's, amio bolus not given.

## 2020-11-09 LAB — CBC
HCT: 25.6 % — ABNORMAL LOW (ref 39.0–52.0)
Hemoglobin: 8.5 g/dL — ABNORMAL LOW (ref 13.0–17.0)
MCH: 33.1 pg (ref 26.0–34.0)
MCHC: 33.2 g/dL (ref 30.0–36.0)
MCV: 99.6 fL (ref 80.0–100.0)
Platelets: 126 10*3/uL — ABNORMAL LOW (ref 150–400)
RBC: 2.57 MIL/uL — ABNORMAL LOW (ref 4.22–5.81)
RDW: 14.2 % (ref 11.5–15.5)
WBC: 9.3 10*3/uL (ref 4.0–10.5)
nRBC: 0 % (ref 0.0–0.2)

## 2020-11-09 LAB — RENAL FUNCTION PANEL
Albumin: 2 g/dL — ABNORMAL LOW (ref 3.5–5.0)
Albumin: 2.2 g/dL — ABNORMAL LOW (ref 3.5–5.0)
Anion gap: 10 (ref 5–15)
Anion gap: 10 (ref 5–15)
BUN: 72 mg/dL — ABNORMAL HIGH (ref 6–20)
BUN: 62 mg/dL — ABNORMAL HIGH (ref 6–20)
CO2: 22 mmol/L (ref 22–32)
CO2: 22 mmol/L (ref 22–32)
Calcium: 7.6 mg/dL — ABNORMAL LOW (ref 8.9–10.3)
Calcium: 7.9 mg/dL — ABNORMAL LOW (ref 8.9–10.3)
Chloride: 95 mmol/L — ABNORMAL LOW (ref 98–111)
Chloride: 99 mmol/L (ref 98–111)
Creatinine, Ser: 2.39 mg/dL — ABNORMAL HIGH (ref 0.61–1.24)
Creatinine, Ser: 2.13 mg/dL — ABNORMAL HIGH (ref 0.61–1.24)
GFR, Estimated: 31 mL/min — ABNORMAL LOW (ref >60)
GFR, Estimated: 35 mL/min — ABNORMAL LOW (ref >60)
Glucose, Bld: 338 mg/dL — ABNORMAL HIGH (ref 70–99)
Glucose, Bld: 185 mg/dL — ABNORMAL HIGH (ref 70–99)
Phosphorus: 4.3 mg/dL (ref 2.5–4.6)
Phosphorus: 4.9 mg/dL — ABNORMAL HIGH (ref 2.5–4.6)
Potassium: 3.4 mmol/L — ABNORMAL LOW (ref 3.5–5.1)
Potassium: 4.8 mmol/L (ref 3.5–5.1)
Sodium: 127 mmol/L — ABNORMAL LOW (ref 135–145)
Sodium: 131 mmol/L — ABNORMAL LOW (ref 135–145)

## 2020-11-09 LAB — HEPATIC FUNCTION PANEL
ALT: 21 U/L (ref 0–44)
AST: 20 U/L (ref 15–41)
Albumin: 2 g/dL — ABNORMAL LOW (ref 3.5–5.0)
Alkaline Phosphatase: 63 U/L (ref 38–126)
Bilirubin, Direct: 0.1 mg/dL (ref 0.0–0.2)
Indirect Bilirubin: 0.2 mg/dL — ABNORMAL LOW (ref 0.3–0.9)
Total Bilirubin: 0.3 mg/dL (ref 0.3–1.2)
Total Protein: 6.6 g/dL (ref 6.5–8.1)

## 2020-11-09 LAB — GLUCOSE, CAPILLARY
Glucose-Capillary: 106 mg/dL — ABNORMAL HIGH (ref 70–99)
Glucose-Capillary: 100 mg/dL — ABNORMAL HIGH (ref 70–99)
Glucose-Capillary: 102 mg/dL — ABNORMAL HIGH (ref 70–99)

## 2020-11-09 LAB — APTT: aPTT: 48 seconds — ABNORMAL HIGH (ref 24–36)

## 2020-11-09 LAB — MAGNESIUM: Magnesium: 2.5 mg/dL — ABNORMAL HIGH (ref 1.7–2.4)

## 2020-11-09 MED ORDER — POTASSIUM CHLORIDE 20 MEQ PO PACK
40.0000 meq | PACK | Freq: Four times a day (QID) | ORAL | Status: AC
  Administered 2020-11-09 (×2): 40 meq via ORAL
  Filled 2020-11-09 (×2): qty 2

## 2020-11-09 MED ORDER — METOCLOPRAMIDE HCL 5 MG/ML IJ SOLN: 10.0000 mg | Freq: Four times a day (QID) | INTRAMUSCULAR | Status: DC

## 2020-11-09 MED ORDER — METOCLOPRAMIDE HCL 5 MG/ML IJ SOLN
5.0000 mg | Freq: Four times a day (QID) | INTRAMUSCULAR | Status: DC
  Administered 2020-11-09 – 2020-11-10 (×4): 5 mg via INTRAVENOUS
  Filled 2020-11-09 (×4): qty 2

## 2020-11-09 MED ORDER — CLONAZEPAM 1 MG PO TABS
2.0000 mg | ORAL_TABLET | Freq: Four times a day (QID) | ORAL | Status: DC
  Administered 2020-11-09 (×3): 2 mg
  Filled 2020-11-09 (×3): qty 2

## 2020-11-09 MED ORDER — PIVOT 1.5 CAL PO LIQD
1000.0000 mL | ORAL | Status: DC
  Administered 2020-11-09 – 2020-11-10 (×2): 1000 mL
  Filled 2020-11-09 (×2): qty 1000

## 2020-11-09 NOTE — Progress Notes (Signed)
[facility] Progress Note  Subjective:  Patient not examined today directly given COVID-19 + status, utilizing data taken from chart +/- discussions w/ providers and staff.     I/O yest 2.7 L in/ 8.6 L out = net neg 5800 L   Vitals:   11/09/20 0900 11/09/20 0930 11/09/20 0942 11/09/20 1110  BP: 107/69 119/76    Pulse: 97 (!) 103    Resp: (!) 21 18    Temp: (!) 97.16 F (36.2 C) (!) 97.34 F (36.3 C)    TempSrc:      SpO2: 93% 92% 91% 95%  Weight:      Height:        Exam:   Patient not examined today directly given COVID-19 + status, utilizing data taken from chart +/- discussions w/ providers and staff.      IV abx:  Vancomycin 12/5-12/6 and 12/11 x 1  Levaquin 12/7- 12/10 Cefepime 12/5 -12/7 and 12/11- current   UA >50 rbc, 11-20 wbc, rare bact, 30 prot   Renal US - IMPRESSION: Normal kidneys.  Bladder not visualized.    UNa 19, UCr 49     CXR - 12/15 > IMPRESSION: Lower lung volumes with bilateral COVID-19 pneumonia mildly improved compared to 11/02/2020. No new cardiopulmonary abnormality     Wt's = admit 120kg, peak 135kg, today 130kg       Assessment/ Plan: 1. Renal failure - acute kidney injury, nonoliguric w/ rising creat / BUN due to shock +/-  hemodynamics/ ^PEEP effects +/-COVID-19 effects. [place] w/o hydro, normal appearing kidneys. UA +hematuria/ pyuria. Creat 1.1 on admit, worsening up to 5.4 on 12/16 w/ BUN 178 and pt was started on CRRT.   Labs improving as expected. Cont CRRT D#3.  2. BP/ volume - off pressors, BP's wnl. Tolerating UF 5.8 L net neg yesterday, wt's starting to come down, BP's okay. Will lower a bit to 150- 200 cc/hr UF today. 3. COVID pna/ ARDS - per CCM, mult infiltrates on CXR. As above.  4. Diarrhea - rectal tube 5. Stenotrophomonas PNA - per CCM     Rob [place] 11/09/2020, 11:18 AM   Recent Labs  Lab 11/08/20 0513 11/08/20 1623 11/09/20 0322  K 4.1 3.8 3.4*  BUN 142* 99* 72*  CREATININE 3.77* 3.08* 2.39*   CALCIUM 7.8* 8.0* 7.6*  PHOS 7.9* 5.2* 4.3  HGB 9.0*  --  8.5*   Inpatient medications: . chlorhexidine gluconate (MEDLINE KIT)  15 mL Mouth Rinse BID  . Chlorhexidine Gluconate Cloth  6 each Topical Q0600  . clonazePAM  2 mg Per Tube Q6H  . collagenase   Topical Daily  . famotidine  20 mg Per Tube Daily  . feeding supplement (PIVOT 1.5 CAL)  1,000 mL Per Tube Q24H  . feeding supplement (PROSource TF)  90 mL Per Tube QID  . heparin injection (subcutaneous)  7,500 Units Subcutaneous Q8H  . HYDROmorphone HCl  3 mg Oral Q4H  . insulin aspart  0-15 Units Subcutaneous Q4H  . linagliptin  5 mg Per Tube Daily  . mouth rinse  15 mL Mouth Rinse 10 times per day  . metoCLOPramide (REGLAN) injection  10 mg Intravenous Q6H  . multivitamin  15 mL Per Tube Daily  . polyethylene glycol  17 g Per Tube Daily  . potassium chloride  40 mEq Oral Q6H  . sodium chloride flush  10-40 mL Intracatheter Q12H  . thiamine  100 mg Per Tube Daily   .  prismasol  BGK 4/2.5 400 mL/hr at 11/08/20 2203  .  prismasol BGK 4/2.5 200 mL/hr at 11/08/20 2208  . sodium chloride 10 mL/hr at 11/04/20 0800  . dextrose 25 mL/hr at 11/09/20 1003  . HYDROmorphone 3 mg/hr (11/09/20 0906)  . midazolam 4 mg/hr (11/09/20 0906)  . prismasol BGK 4/2.5 1,800 mL/hr at 11/09/20 1000   sodium chloride, acetaminophen (TYLENOL) oral liquid 160 mg/5 mL, alteplase, heparin, HYDROmorphone, lip balm, sodium chloride, sodium chloride flush, Thrombi-Pad

## 2020-11-09 NOTE — Plan of Care (Signed)
Discussed in front of patient plan of care for the evening, temperature control and playing his favorite music with no evidence of learning at this time  Problem: Health Behavior/Discharge Planning: Goal: Ability to manage health-related needs will improve Outcome: Not Progressing   Problem: Coping: Goal: Level of anxiety will decrease Outcome: Not Progressing

## 2020-11-09 NOTE — Plan of Care (Signed)
  Problem: Clinical Measurements: Goal: Will remain free from infection Outcome: Progressing   Problem: Clinical Measurements: Goal: Ability to maintain clinical measurements within normal limits will improve Outcome: Progressing   Problem: Clinical Measurements: Goal: Respiratory complications will improve Outcome: Progressing   Problem: Clinical Measurements: Goal: Diagnostic test results will improve Outcome: Progressing   Problem: Nutrition: Goal: Adequate nutrition will be maintained Outcome: Progressing   Problem: Pain Managment: Goal: General experience of comfort will improve Outcome: Progressing

## 2020-11-09 NOTE — Progress Notes (Signed)
NAME:  Jackson Figueroa, MRN:  [id number], DOB:  10-07-1961, LOS: 58 ADMISSION DATE:  10/20/2020, CONSULTATION DATE:  11/29 REFERRING MD:  Sabra Heck, CHIEF COMPLAINT:  ARDS COVID 31   Brief History   59 year old male with no significant past medical history and a positive COVID-19 test on 11/26 presented to [facility] with profound hypoxemia on 11/28.  Intubated immediately upon arrival found to be in ARDS. Transferred to [facility] hospital for ongoing ICU care in the early a.m. hours of 11/29.  Past Medical History  Obesity  Significant Hospital Events   11/28 Presented to [facility] ER, intubated for hypoxemia, empiric abx, shock on levo. COVID Rx 11/29 Persistent shock, central line placed, ARDS protocol continued, proning initiated 11/30 Pressor requirements continued, NMB initiated to facilitate ventilation tube feeds started, initiated proning 12/01 Stopping NMB, proning positioning protocol discontinued, free water for rising creatinine and sodium 12/02 PEEP trending down.  Renal function improved. Changed to versed from prop gtt d/t rising triglycerides  12/03 Remdesivir completed. Peep/FIO2 stable. Free water adjusted. Solumedrol reduced, fentanyl to Dilaudid, adding Oxycodone  12/04 Dyssynchrony, given paralytic overnight, changed to ketamine; vomited / aspirated, restarted paralytics 12/06 Proned, under NMB 12/07 Proning, NMB continued. Switched antibiotics for stenotrophomonas 12/13 Stopped oxycodone, added dilaudid oral, continuous nimbex infusion to prn, palliative care consulted 12/14 Changed to pressure control 12/15 Worsening vent synchrony after clamping OG tube 12/18added reglan IV for ? Ileus developing   Consults:  Nephrology 12/14 Palliative 12/13 ID signed off 12/17  Procedures:  ETT 11/28 >> CVL 11/29 >> 12/9 R PICC 12/9 >>  R IJ HD Cath 12/17 >>  Significant Diagnostic Tests:  ECHO 11/29 >> LVEF 50-55%, mild LVH, RV systolic function mildly reduced. Mild  dilation of ascending aorta 30mm. LE Venous Duplex 12/8 >> negative for DVT   Micro Data:  COVID 11/28 >> Positive  Influenza A/B 11/28 >> negative  UC 11/28 >> negative  BCx2 11/28 >> negative  Tracheal aspirate 12/5 >> Stenotrophomonas maltophilia >> S-bactrim, levofloxacin UC 12/11 >> negative  Tracheal aspirate 12/11 >> Stenotrophomonas maltophilia >> S-bactrim, levofloxacin  BCx2 12/11 >> negative   Antimicrobials/covid rx:  CTX 11/28 > 11/29 Azithromycin 11/28 > 11/29 Remdesivir 11/28 > 12/2 Prednisone 11/28 >  Baricitinib 12/4 > 12/5 Vanco 12/5 >> 12/7, 12/11 x1 Cefepime 12/5 >> 12/7, 12/11 >> 12/13 Levaquin 12/7 >> 12/16   Scheduled Meds: . chlorhexidine gluconate (MEDLINE KIT)  15 mL Mouth Rinse BID  . Chlorhexidine Gluconate Cloth  6 each Topical Q0600  . clonazePAM  2 mg Per Tube Q8H  . collagenase   Topical Daily  . docusate  100 mg Per Tube BID  . famotidine  20 mg Per Tube Daily  . feeding supplement (PIVOT 1.5 CAL)  1,000 mL Per Tube Q24H  . feeding supplement (PROSource TF)  90 mL Per Tube QID  . heparin injection (subcutaneous)  7,500 Units Subcutaneous Q8H  . HYDROmorphone HCl  3 mg Oral Q4H  . insulin aspart  0-15 Units Subcutaneous Q4H  . insulin aspart  1 Units Subcutaneous Q4H  . insulin glargine  5 Units Subcutaneous Daily  . linagliptin  5 mg Per Tube Daily  . mouth rinse  15 mL Mouth Rinse 10 times per day  . multivitamin  15 mL Per Tube Daily  . polyethylene glycol  17 g Per Tube Daily  . potassium chloride  40 mEq Oral Q6H  . sodium chloride flush  10-40 mL Intracatheter Q12H  .  thiamine  100 mg Per Tube Daily   Continuous Infusions: .  prismasol BGK 4/2.5 400 mL/hr at 11/08/20 2203  .  prismasol BGK 4/2.5 200 mL/hr at 11/08/20 2208  . sodium chloride 10 mL/hr at 11/04/20 0800  . dextrose 75 mL/hr at 11/09/20 0800  . HYDROmorphone 3 mg/hr (11/09/20 0800)  . midazolam 4 mg/hr (11/09/20 0800)  . prismasol BGK 4/2.5 1,800 mL/hr at 11/09/20  0707   PRN Meds:.sodium chloride, acetaminophen (TYLENOL) oral liquid 160 mg/5 mL, alteplase, heparin, HYDROmorphone, lip balm, midazolam, sodium chloride, sodium chloride flush, Thrombi-Pad     Interim history/subjective:   comfortable to PCV  Ventilation with large tidal volumes on very low amts of pressure and good gas exchange   Objective   Blood pressure 129/78, pulse 89, temperature (!) 96.98 F (36.1 C), resp. rate 16, height 6' (1.829 m), weight 130 kg, SpO2 97 %.    Vent Mode: PSV;CPAP FiO2 (%):  [40 %] 40 % Set Rate:  [15 bmp] 15 bmp PEEP:  [5 cmH20-8 cmH20] 5 cmH20 Pressure Support:  [5 cmH20] 5 cmH20   Intake/Output Summary (Last 24 hours) at 11/09/2020 0853 Last data filed at 11/09/2020 0818 Gross per 24 hour  Intake 2656.33 ml  Output 7608 ml  Net -4951.67 ml   Filed Weights   11/07/20 0500 11/08/20 0500 11/09/20 0500  Weight: 133.7 kg 135.4 kg 130 kg    Examination: Tmax  99.3 Pt  Sedated on vent still on versed drip/ clonazepam/ dilaudid po and iv Oropharynx clear,  mucosa nl Neck supple Lungs with a few scattered exp > insp rhonchi bilaterally RRR no s3 or or sign murmur Abd obese with absent bs/ poor excursion and lots of OG output as well as diarrhea  Extr warm with 1+ ptting   edema        I personally reviewed images and agree with radiology impression as follows:  CXR:   Portable 12/17 Tube and catheter positions as described without pneumothorax. Patchy airspace opacity bilaterally is likely due to underlying atypical organism pneumonia. Stable cardiac prominence. Overall appearance unchanged from 1 day prior.   Resolved Hospital Problem list     Assessment & Plan:   ARDS due to COVID 19 PNA Intermittent periods of worsening dyssynchrony, most recently on 12/15 in the setting of aspiration.   -PCV  Seems to be the most comfortable form him and gas exchange,mechanics now quite good  -VAP prevention measures  -likely will need trach  next week, given progress consider scheduling with ENT on  12/22   Stenotrophomonas pneumonia, persistent on repeat culture; improvement clinically as of 12/14 - ID following  -monitor off abx for now  -if new fever > 101.5, repeat cultures - d/c covid isolation since > 21 days out from dx and non immunocompromised host   Severe ventilator dyssynchrony Acute metabolic encephalopathy -RASS goal -1 to -2 -continue dilaudid gtt, versed per PAD protocol  -klonopin 2 mg increased to q 6per feeding tube 12/18  Vomiting, slow GI transit Nutrition -TF restarted 12/17, no further vomiting -OGT with lots of output 12/18 so reglan started   Hyperglycemia -SSI, moderate scale   -linagliptin 5 mg qd PT  - last dose lantus 12/18 9am   Non-oliguric AKI   Worsening labs 12/16, but overall picture improving.  CVVHD initiated.   Hyponatremia 12/18 > decrease d5w  - Nephrology following  -Trend BMP / urinary output -Replace electrolytes as indicated -Avoid nephrotoxic agents, ensure adequate renal perfusion  Diarrhea -monitor fever curve, WBC trend  Sister called for update 12/17  Best practice (evaluated daily)  Diet: tube feeding Pain/Anxiety/Delirium protocol (if indicated): as above VAP protocol (if indicated): yes DVT prophylaxis: heparin sq  GI prophylaxis: famotidine Glucose control: as above Mobility: bed rest Last date of multidisciplinary goals of care discussion 12/14 with sister.   Family and staff present n/a Summary of discussion n/a Follow up goals of care discussion due: 12/22 Code Status: full code  Disposition: ICU  Labs   CBC: Recent Labs  Lab 11/04/20 0600 11/05/20 0515 11/06/20 0316 11/08/20 0513 11/09/20 0322  WBC 11.4* 8.6 9.2 9.6 9.3  NEUTROABS  --  6.7 7.0 7.9*  --   HGB 10.4* 9.4* 9.4* 9.0* 8.5*  HCT 33.8* 30.3* 28.7* 28.4* 25.6*  MCV 108.0* 103.8* 102.9* 101.4* 99.6  PLT 169 144* 149* 128* 126*    Basic Metabolic Panel: Recent Labs  Lab  11/07/20 1030 11/07/20 1718 11/08/20 0513 11/08/20 1623 11/09/20 0322  NA 135 135 135 134* 127*  K 4.8 5.3* 4.1 3.8 3.4*  CL 105 104 104 103 95*  CO2 15* 16* 18* 23 22  GLUCOSE 124* 126* 88 115* 338*  BUN 178* 179* 142* 99* 72*  CREATININE 5.41* 5.44* 3.77* 3.08* 2.39*  CALCIUM 8.2* 8.1* 7.8* 8.0* 7.6*  MG  --   --  3.0*  --  2.5*  PHOS  --  13.1* 7.9* 5.2* 4.3   GFR: Estimated Creatinine Clearance: 47.6 mL/min (A) (by C-G formula based on SCr of 2.39 mg/dL (H)). Recent Labs  Lab 11/05/20 0515 11/06/20 0316 11/08/20 0513 11/09/20 0322  WBC 8.6 9.2 9.6 9.3    Liver Function Tests: Recent Labs  Lab 11/05/20 0515 11/06/20 0316 11/07/20 1718 11/08/20 0513 11/08/20 1623 11/09/20 0322  AST 18 19  --  24  --  20  ALT 29 26  --  24  --  21  ALKPHOS 60 63  --  64  --  63  BILITOT 0.6 0.6  --  0.6  --  0.3  PROT 5.9* 6.5  --  6.7  --  6.6  ALBUMIN 1.9* 2.0* 2.3* 2.2*  2.2* 2.1* 2.0*  2.0*   No results for input(s): LIPASE, AMYLASE in the last 168 hours. No results for input(s): AMMONIA in the last 168 hours.  ABG    Component Value Date/Time   PHART 7.160 (LL) 11/07/2020 1023   PCO2ART 47.2 11/07/2020 1023   PO2ART 111 (H) 11/07/2020 1023   HCO3 16.1 (L) 11/07/2020 1023   ACIDBASEDEF 11.9 (H) 11/07/2020 1023   O2SAT 96.8 11/07/2020 1023     Coagulation Profile: Recent Labs  Lab 11/02/20 0956  INR 1.2    Cardiac Enzymes: No results for input(s): CKTOTAL, CKMB, CKMBINDEX, TROPONINI in the last 168 hours.  HbA1C: Hgb A1c MFr Bld  Date/Time Value Ref Range Status  10/22/2020 03:38 AM 5.3 4.8 - 5.6 % Final    Comment:    (NOTE)         Prediabetes: 5.7 - 6.4         Diabetes: >6.4         Glycemic control for adults with diabetes: <7.0     CBG: Recent Labs  Lab 11/08/20 1152 11/08/20 1537 11/08/20 1957 11/08/20 2313 11/09/20 0303  GLUCAP 102* 108* 101* 106* 106*      The patient is critically ill with multiple organ systems failure and  requires high complexity decision making for assessment  and support, frequent evaluation and titration of therapies, application of advanced monitoring technologies and extensive interpretation of multiple databases. Critical Care Time devoted to patient care services described in this note is 35 minutes.    Christinia Gully, MD Pulmonary and [place] [phone]   After 7:00 pm call Elink  [phone]

## 2020-11-09 NOTE — Progress Notes (Signed)
Adjusted metoclopramide from 10 mg q 6 h to 5 mg q 6 h for patient on CRRT.   [name] D  11/09/20 12:20 PM

## 2020-11-10 ENCOUNTER — Inpatient Hospital Stay (HOSPITAL_COMMUNITY): Payer: BC Managed Care – PPO

## 2020-11-10 LAB — BLOOD GAS, ARTERIAL
Acid-base deficit: 1.9 mmol/L (ref 0.0–2.0)
Acid-base deficit: 1.3 mmol/L (ref 0.0–2.0)
Bicarbonate: 24.4 mmol/L (ref 20.0–28.0)
Bicarbonate: 24.5 mmol/L (ref 20.0–28.0)
Drawn by: 23281
Drawn by: 23281
FIO2: 70
FIO2: 50
MECHVT: 620 mL
MECHVT: 620 mL
O2 Saturation: 98.7 %
O2 Saturation: 95.1 %
PEEP: 5 cmH2O
PEEP: 5 cmH2O
Patient temperature: 35.9
Patient temperature: 36
RATE: 25 resp/min
RATE: 30 resp/min
pCO2 arterial: 49 mmHg — ABNORMAL HIGH (ref 32.0–48.0)
pCO2 arterial: 46.9 mmHg (ref 32.0–48.0)
pH, Arterial: 7.31 — ABNORMAL LOW (ref 7.350–7.450)
pH, Arterial: 7.332 — ABNORMAL LOW (ref 7.350–7.450)
pO2, Arterial: 144 mmHg — ABNORMAL HIGH (ref 83.0–108.0)
pO2, Arterial: 73.7 mmHg — ABNORMAL LOW (ref 83.0–108.0)

## 2020-11-10 LAB — RENAL FUNCTION PANEL
Albumin: 2.1 g/dL — ABNORMAL LOW (ref 3.5–5.0)
Albumin: 2.1 g/dL — ABNORMAL LOW (ref 3.5–5.0)
Anion gap: 9 (ref 5–15)
Anion gap: 9 (ref 5–15)
BUN: 51 mg/dL — ABNORMAL HIGH (ref 6–20)
BUN: 48 mg/dL — ABNORMAL HIGH (ref 6–20)
CO2: 24 mmol/L (ref 22–32)
CO2: 23 mmol/L (ref 22–32)
Calcium: 8 mg/dL — ABNORMAL LOW (ref 8.9–10.3)
Calcium: 8 mg/dL — ABNORMAL LOW (ref 8.9–10.3)
Chloride: 97 mmol/L — ABNORMAL LOW (ref 98–111)
Chloride: 101 mmol/L (ref 98–111)
Creatinine, Ser: 1.91 mg/dL — ABNORMAL HIGH (ref 0.61–1.24)
Creatinine, Ser: 1.73 mg/dL — ABNORMAL HIGH (ref 0.61–1.24)
GFR, Estimated: 40 mL/min — ABNORMAL LOW (ref >60)
GFR, Estimated: 45 mL/min — ABNORMAL LOW (ref >60)
Glucose, Bld: 236 mg/dL — ABNORMAL HIGH (ref 70–99)
Glucose, Bld: 129 mg/dL — ABNORMAL HIGH (ref 70–99)
Phosphorus: 4.3 mg/dL (ref 2.5–4.6)
Phosphorus: 4.2 mg/dL (ref 2.5–4.6)
Potassium: 4 mmol/L (ref 3.5–5.1)
Potassium: 4.3 mmol/L (ref 3.5–5.1)
Sodium: 130 mmol/L — ABNORMAL LOW (ref 135–145)
Sodium: 133 mmol/L — ABNORMAL LOW (ref 135–145)

## 2020-11-10 LAB — HEPATIC FUNCTION PANEL
ALT: 22 U/L (ref 0–44)
AST: 18 U/L (ref 15–41)
Albumin: 2.1 g/dL — ABNORMAL LOW (ref 3.5–5.0)
Alkaline Phosphatase: 67 U/L (ref 38–126)
Bilirubin, Direct: 0.1 mg/dL (ref 0.0–0.2)
Indirect Bilirubin: 0.3 mg/dL (ref 0.3–0.9)
Total Bilirubin: 0.4 mg/dL (ref 0.3–1.2)
Total Protein: 7.1 g/dL (ref 6.5–8.1)

## 2020-11-10 LAB — CBC
HCT: 28.4 % — ABNORMAL LOW (ref 39.0–52.0)
Hemoglobin: 9.2 g/dL — ABNORMAL LOW (ref 13.0–17.0)
MCH: 32.7 pg (ref 26.0–34.0)
MCHC: 32.4 g/dL (ref 30.0–36.0)
MCV: 101.1 fL — ABNORMAL HIGH (ref 80.0–100.0)
Platelets: 130 10*3/uL — ABNORMAL LOW (ref 150–400)
RBC: 2.81 MIL/uL — ABNORMAL LOW (ref 4.22–5.81)
RDW: 14.4 % (ref 11.5–15.5)
WBC: 11.5 10*3/uL — ABNORMAL HIGH (ref 4.0–10.5)
nRBC: 0 % (ref 0.0–0.2)

## 2020-11-10 LAB — GLUCOSE, CAPILLARY
Glucose-Capillary: 104 mg/dL — ABNORMAL HIGH (ref 70–99)
Glucose-Capillary: 133 mg/dL — ABNORMAL HIGH (ref 70–99)
Glucose-Capillary: 112 mg/dL — ABNORMAL HIGH (ref 70–99)

## 2020-11-10 LAB — MAGNESIUM: Magnesium: 2.5 mg/dL — ABNORMAL HIGH (ref 1.7–2.4)

## 2020-11-10 LAB — APTT: aPTT: 39 seconds — ABNORMAL HIGH (ref 24–36)

## 2020-11-10 MED ORDER — HYDROMORPHONE HCL 1 MG/ML IJ SOLN
1.0000 mg | Freq: Once | INTRAMUSCULAR | Status: AC
Start: 2020-11-10 — End: 2020-11-10
  Administered 2020-11-10: 01:00:00 1 mg via INTRAVENOUS

## 2020-11-10 MED ORDER — ACETYLCYSTEINE 20 % IN SOLN
2.0000 mL | Freq: Four times a day (QID) | RESPIRATORY_TRACT | Status: DC
  Administered 2020-11-10 – 2020-11-11 (×7): 2 mL via RESPIRATORY_TRACT
  Administered 2020-11-12: 09:00:00 4 mL via RESPIRATORY_TRACT
  Administered 2020-11-12 – 2020-11-13 (×4): 2 mL via RESPIRATORY_TRACT
  Filled 2020-11-10 (×13): qty 4

## 2020-11-10 MED ORDER — HYDROMORPHONE BOLUS VIA INFUSION
0.5000 mg | INTRAVENOUS | Status: DC | PRN
  Administered 2020-11-10 – 2020-11-15 (×20): 0.5 mg via INTRAVENOUS
  Filled 2020-11-10: qty 1

## 2020-11-10 MED ORDER — PIVOT 1.5 CAL PO LIQD
1000.0000 mL | ORAL | Status: DC
  Administered 2020-11-12 – 2020-11-14 (×3): 1000 mL
  Filled 2020-11-10 (×4): qty 1000

## 2020-11-10 MED ORDER — SODIUM CHLORIDE 0.9 % IV SOLN
1.0000 mg/h | INTRAVENOUS | Status: DC
  Administered 2020-11-10 – 2020-11-11 (×4): 4 mg/h via INTRAVENOUS
  Administered 2020-11-12: 18:00:00 2.5 mg/h via INTRAVENOUS
  Administered 2020-11-12: 03:00:00 4 mg/h via INTRAVENOUS
  Administered 2020-11-13 – 2020-11-14 (×2): 2 mg/h via INTRAVENOUS
  Administered 2020-11-15: 23:00:00 1 mg/h via INTRAVENOUS
  Filled 2020-11-10 (×15): qty 5

## 2020-11-10 MED ORDER — MIDAZOLAM BOLUS VIA INFUSION
1.0000 mg | INTRAVENOUS | Status: DC | PRN
Start: 2020-11-10 — End: 2020-11-14
  Administered 2020-11-10 (×4): 1 mg via INTRAVENOUS
  Administered 2020-11-10: 08:00:00 2 mg via INTRAVENOUS
  Administered 2020-11-11 (×3): 1 mg via INTRAVENOUS
  Administered 2020-11-11: 05:00:00 2 mg via INTRAVENOUS
  Administered 2020-11-11 (×4): 1 mg via INTRAVENOUS
  Administered 2020-11-11: 15:00:00 2 mg via INTRAVENOUS
  Administered 2020-11-12 – 2020-11-13 (×6): 1 mg via INTRAVENOUS
  Filled 2020-11-10: qty 2

## 2020-11-10 MED ORDER — METOCLOPRAMIDE HCL 5 MG/ML IJ SOLN
10.0000 mg | Freq: Four times a day (QID) | INTRAMUSCULAR | Status: DC
  Administered 2020-11-10 – 2020-11-17 (×27): 10 mg via INTRAVENOUS
  Filled 2020-11-10 (×27): qty 2

## 2020-11-10 MED ORDER — ARTIFICIAL TEARS OPHTHALMIC OINT
1.0000 "application " | TOPICAL_OINTMENT | Freq: Three times a day (TID) | OPHTHALMIC | Status: DC
  Administered 2020-11-10 – 2020-11-13 (×11): 1 via OPHTHALMIC
  Filled 2020-11-10: qty 3.5

## 2020-11-10 MED ORDER — SODIUM CHLORIDE 0.9 % IV SOLN
1000.0000 [IU]/h | INTRAVENOUS | Status: DC
  Administered 2020-11-10 – 2020-11-11 (×3): 500 [IU]/h via INTRAVENOUS_CENTRAL
  Administered 2020-11-12: 18:00:00 1000 [IU]/h via INTRAVENOUS_CENTRAL
  Administered 2020-11-12: 07:00:00 750 [IU]/h via INTRAVENOUS_CENTRAL
  Administered 2020-11-13 – 2020-11-17 (×12): 1000 [IU]/h via INTRAVENOUS_CENTRAL
  Filled 2020-11-10: qty 10000
  Filled 2020-11-10: qty 2
  Filled 2020-11-10: qty 5833.33
  Filled 2020-11-10 (×6): qty 10000
  Filled 2020-11-10: qty 5833.33
  Filled 2020-11-10 (×2): qty 10000
  Filled 2020-11-10: qty 2
  Filled 2020-11-10 (×2): qty 10000
  Filled 2020-11-10: qty 5833.33
  Filled 2020-11-10: qty 10000
  Filled 2020-11-10 (×2): qty 2
  Filled 2020-11-10: qty 10000

## 2020-11-10 MED ORDER — CISATRACURIUM BESYLATE (PF) 10 MG/5ML IV SOLN
0.1000 mg/kg | INTRAVENOUS | Status: DC | PRN
  Administered 2020-11-10 (×2): 13 mg via INTRAVENOUS
  Filled 2020-11-10 (×5): qty 6.5

## 2020-11-10 MED ORDER — MIDAZOLAM 50MG/50ML (1MG/ML) PREMIX INFUSION
2.0000 mg/h | INTRAVENOUS | Status: DC
  Administered 2020-11-10: 20:00:00 7 mg/h via INTRAVENOUS
  Administered 2020-11-10 (×3): 10 mg/h via INTRAVENOUS
  Administered 2020-11-11: 15:00:00 9 mg/h via INTRAVENOUS
  Administered 2020-11-11: 12:00:00 7 mg/h via INTRAVENOUS
  Administered 2020-11-12 (×2): 10 mg/h via INTRAVENOUS
  Administered 2020-11-12: 16:00:00 7 mg/h via INTRAVENOUS
  Administered 2020-11-12: 01:00:00 10 mg/h via INTRAVENOUS
  Administered 2020-11-13: 02:00:00 5 mg/h via INTRAVENOUS
  Administered 2020-11-13: 23:00:00 6 mg/h via INTRAVENOUS
  Administered 2020-11-13: 16:00:00 5 mg/h via INTRAVENOUS
  Administered 2020-11-14: 06:00:00 3 mg/h via INTRAVENOUS
  Filled 2020-11-10 (×11): qty 50
  Filled 2020-11-10: qty 100
  Filled 2020-11-10 (×2): qty 50

## 2020-11-10 MED ORDER — ALBUTEROL SULFATE (2.5 MG/3ML) 0.083% IN NEBU
2.5000 mg | INHALATION_SOLUTION | Freq: Four times a day (QID) | RESPIRATORY_TRACT | Status: DC
  Administered 2020-11-10 – 2020-12-04 (×91): 2.5 mg via RESPIRATORY_TRACT
  Filled 2020-11-10 (×91): qty 3

## 2020-11-10 MED ORDER — HEPARIN BOLUS VIA INFUSION (CRRT)
1000.0000 [IU] | INTRAVENOUS | Status: DC | PRN
  Filled 2020-11-10 (×3): qty 1000

## 2020-11-10 NOTE — Progress Notes (Signed)
NAME:  Jackson Figueroa, MRN:  [id number], DOB:  [birth year], LOS: 21 ADMISSION DATE:  10/20/2020, CONSULTATION DATE:  11/29 REFERRING MD:  Sabra Heck, CHIEF COMPLAINT:  ARDS COVID 29   Brief History   59 year old male with no significant past medical history and a positive COVID-19 test on 11/26 presented to [facility] with profound hypoxemia on 11/28.  Intubated immediately upon arrival found to be in ARDS. Transferred to [facility] long hospital for ongoing ICU care in the early a.m. hours of 11/29.  Past Medical History  Obesity  Significant Hospital Events   11/28 Presented to [facility] ER, intubated for hypoxemia, empiric abx, shock on levo. COVID Rx 11/29 Persistent shock, central line placed, ARDS protocol continued, proning initiated 11/30 Pressor requirements continued, NMB initiated to facilitate ventilation tube feeds started, initiated proning 12/01 Stopping NMB, proning positioning protocol discontinued, free water for rising creatinine and sodium 12/02 PEEP trending down.  Renal function improved. Changed to versed from prop gtt d/t rising triglycerides  12/03 Remdesivir completed. Peep/FIO2 stable. Free water adjusted. Solumedrol reduced, fentanyl to Dilaudid, adding Oxycodone  12/04 Dyssynchrony, given paralytic overnight, changed to ketamine; vomited / aspirated, restarted paralytics 12/06 Proned, under NMB 12/07 Proning, NMB continued. Switched antibiotics for stenotrophomonas 12/13 Stopped oxycodone, added dilaudid oral, continuous nimbex infusion to prn, palliative care consulted 12/14 Changed to pressure control 12/15 Worsening vent synchrony after clamping OG tube 12/18added reglan IV for ? Ileus developing  12/18 back on paralytics for "guppy breathing/ air stacking" on PCV ? ,mucus plug  12/16  Back on PCV and off paralytics   Consults:  Nephrology 12/14 Palliative 12/13 ID signed off 12/17  Procedures:  ETT 11/28 >> CVL 11/29 >> 12/9 R PICC 12/9 >>  R IJ HD Cath  12/17 >>  Significant Diagnostic Tests:  ECHO 11/29 >> LVEF 50-55%, mild LVH, RV systolic function mildly reduced. Mild dilation of ascending aorta 15mm. LE Venous Duplex 12/8 >> negative for DVT   Micro Data:  COVID 11/28 >> Positive  Influenza A/B 11/28 >> negative  UC 11/28 >> negative  BCx2 11/28 >> negative  Tracheal aspirate 12/5 >> Stenotrophomonas maltophilia >> S-bactrim, levofloxacin UC 12/11 >> negative  Tracheal aspirate 12/11 >> Stenotrophomonas maltophilia >> S-bactrim, levofloxacin  BCx2 12/11 >> negative  Trach 12/ 19 >>>  Antimicrobials/covid rx:  CTX 11/28 > 11/29 Azithromycin 11/28 > 11/29 Remdesivir 11/28 > 12/2 Prednisone 11/28 >  Baricitinib 12/4 > 12/5 Vanco 12/5 >> 12/7, 12/11 x1 Cefepime 12/5 >> 12/7, 12/11 >> 12/13 Levaquin 12/7 >> 12/16   Scheduled Meds: . artificial tears  1 application Both Eyes M3W  . chlorhexidine gluconate (MEDLINE KIT)  15 mL Mouth Rinse BID  . Chlorhexidine Gluconate Cloth  6 each Topical Q0600  . collagenase   Topical Daily  . famotidine  20 mg Per Tube Daily  . feeding supplement (PIVOT 1.5 CAL)  1,000 mL Per Tube Q24H  . feeding supplement (PROSource TF)  90 mL Per Tube QID  . heparin injection (subcutaneous)  7,500 Units Subcutaneous Q8H  . HYDROmorphone HCl  3 mg Oral Q4H  . insulin aspart  0-15 Units Subcutaneous Q4H  . linagliptin  5 mg Per Tube Daily  . mouth rinse  15 mL Mouth Rinse 10 times per day  . metoCLOPramide (REGLAN) injection  5 mg Intravenous Q6H  . multivitamin  15 mL Per Tube Daily  . polyethylene glycol  17 g Per Tube Daily  . sodium chloride flush  10-40 mL Intracatheter  Q12H  . thiamine  100 mg Per Tube Daily   Continuous Infusions: .  prismasol BGK 4/2.5 400 mL/hr at 11/10/20 0136  .  prismasol BGK 4/2.5 200 mL/hr at 11/10/20 0134  . sodium chloride 10 mL/hr at 11/04/20 0800  . dextrose 25 mL/hr at 11/10/20 0900  . heparin 10,000 units/ 20 mL infusion syringe    . HYDROmorphone 4 mg/hr  (11/10/20 0900)  . midazolam 10 mg/hr (11/10/20 0900)  . prismasol BGK 4/2.5 1,800 mL/hr at 11/10/20 0727   PRN Meds:.sodium chloride, acetaminophen (TYLENOL) oral liquid 160 mg/5 mL, alteplase, cisatracurium (NIMBEX) injection, heparin, heparin, HYDROmorphone, lip balm, midazolam, sodium chloride, sodium chloride flush, Thrombi-Pad     Interim history/subjective:  Back on PCV this am now synchronous again s paralytics   Objective   Blood pressure 104/68, pulse (!) 123, temperature 99.68 F (37.6 C), resp. rate (!) 25, height 6' (1.829 m), weight 126.2 kg, SpO2 93 %. CVP:  [9 mmHg] 9 mmHg  Vent Mode: PCV FiO2 (%):  [40 %-70 %] 40 % Set Rate:  [15 bmp-30 bmp] 15 bmp Vt Set:  [620 mL] 620 mL PEEP:  [5 cmH20-8 cmH20] 5 cmH20 Plateau Pressure:  [16 cmH20-28 cmH20] 25 cmH20   Intake/Output Summary (Last 24 hours) at 11/10/2020 0917 Last data filed at 11/10/2020 0900 Gross per 24 hour  Intake 1621.04 ml  Output 6282 ml  Net -4660.96 ml   Filed Weights   11/08/20 0500 11/09/20 0500 11/10/20 0335  Weight: 135.4 kg 130 kg 126.2 kg    Examination: Tmax   101.1 (new spike) Pt sedated Oropharynx ET  Neck supple Lungs with   scattered exp > insp rhonchi bilaterally RRR no s3 or or sign murmur Abd obese with limited  excursion  Extr warm with 1+ pitting  edema        I personally reviewed images and agree with radiology impression as follows:  CXR:   Portable 12/19 Bilateral multifocal infiltrates, unchanged in severity when compared to the prior study.     Resolved Hospital Problem list     Assessment & Plan:   ARDS due to COVID 19 PNA Intermittent periods of worsening dyssynchrony, most recently on 12/15 in the setting of aspiration but again 12/18 ? Mucus plugs? -PCV   Best mode if not plugging and may allow less sedation/ avoid paralytics -VAP prevention measures  -likely will need trach next week, given progress consider scheduling with ENT on   12/22   Stenotrophomonas pneumonia, persistent on repeat culture; improvement clinically as of 12/14 - ID off 12/17  -monitor off abx for now  - see micro flow sheet above        Severe ventilator dyssynchrony Acute metabolic encephalopathy -RASS goal -1 to -2 -continue dilaudid gtt, versed per PAD protocol  -klonopin 2 mg increased to q 6per feeding tube 12/18  Vomiting, slow GI transit Nutrition -TF restarted 12/17, no further vomiting -OGT with lots of output 12/18 so reglan started > ? Response   Hyperglycemia -SSI, moderate scale  > d/c all SSI 12/19 as trending to hypoglycemia  -linagliptin 5 mg qd PT  - last dose lantus 12/18 9 am    Non-oliguric AKI   Worsening labs 12/16, but overall picture improving.  CVVHD initiated.   Hyponatremia 12/18 > worse 12/19 so d/c   d5w  - Nephrology following  -Trend BMP / urinary output -Replace electrolytes as indicated -Avoid nephrotoxic agents, ensure adequate renal perfusion  Diarrhea -monitor fever curve,  WBC trend  Sister called for update 12/17  Anemia of acute dz   Lab Results  Component Value Date   HGB 9.2 (L) 11/10/2020   HGB 8.5 (L) 11/09/2020   HGB 9.0 (L) 11/08/2020     Best practice (evaluated daily)  Diet: tube feeding Pain/Anxiety/Delirium protocol (if indicated): as above VAP protocol (if indicated): yes DVT prophylaxis: heparin sq  GI prophylaxis: famotidine Glucose control: as above Mobility: bed rest Last date of multidisciplinary goals of care discussion 12/14 with sister.   Family and staff present n/a Summary of discussion n/a Follow up goals of care discussion due: 12/22 Code Status: full code  Disposition: ICU  Labs   CBC: Recent Labs  Lab 11/05/20 0515 11/06/20 0316 11/08/20 0513 11/09/20 0322 11/10/20 0335  WBC 8.6 9.2 9.6 9.3 11.5*  NEUTROABS 6.7 7.0 7.9*  --   --   HGB 9.4* 9.4* 9.0* 8.5* 9.2*  HCT 30.3* 28.7* 28.4* 25.6* 28.4*  MCV 103.8* 102.9* 101.4* 99.6 101.1*   PLT 144* 149* 128* 126* 130*    Basic Metabolic Panel: Recent Labs  Lab 11/08/20 0513 11/08/20 1623 11/09/20 0322 11/09/20 1600 11/10/20 0335  NA 135 134* 127* 131* 130*  K 4.1 3.8 3.4* 4.8 4.0  CL 104 103 95* 99 97*  CO2 18* $Remov'23 22 22 24  'JqJZrt$ GLUCOSE 88 115* 338* 185* 236*  BUN 142* 99* 72* 62* 51*  CREATININE 3.77* 3.08* 2.39* 2.13* 1.91*  CALCIUM 7.8* 8.0* 7.6* 7.9* 8.0*  MG 3.0*  --  2.5*  --  2.5*  PHOS 7.9* 5.2* 4.3 4.9* 4.3   GFR: Estimated Creatinine Clearance: 58.5 mL/min (A) (by C-G formula based on SCr of 1.91 mg/dL (H)). Recent Labs  Lab 11/06/20 0316 11/08/20 0513 11/09/20 0322 11/10/20 0335  WBC 9.2 9.6 9.3 11.5*    Liver Function Tests: Recent Labs  Lab 11/05/20 0515 11/06/20 0316 11/07/20 1718 11/08/20 0513 11/08/20 1623 11/09/20 0322 11/09/20 1600 11/10/20 0335  AST 18 19  --  24  --  20  --  18  ALT 29 26  --  24  --  21  --  22  ALKPHOS 60 63  --  64  --  63  --  67  BILITOT 0.6 0.6  --  0.6  --  0.3  --  0.4  PROT 5.9* 6.5  --  6.7  --  6.6  --  7.1  ALBUMIN 1.9* 2.0*   < > 2.2*  2.2* 2.1* 2.0*  2.0* 2.2* 2.1*  2.1*   < > = values in this interval not displayed.   No results for input(s): LIPASE, AMYLASE in the last 168 hours. No results for input(s): AMMONIA in the last 168 hours.  ABG    Component Value Date/Time   PHART 7.332 (L) 11/10/2020 0625   PCO2ART 46.9 11/10/2020 0625   PO2ART 73.7 (L) 11/10/2020 0625   HCO3 24.5 11/10/2020 0625   ACIDBASEDEF 1.3 11/10/2020 0625   O2SAT 95.1 11/10/2020 0625     Coagulation Profile: No results for input(s): INR, PROTIME in the last 168 hours.  Cardiac Enzymes: No results for input(s): CKTOTAL, CKMB, CKMBINDEX, TROPONINI in the last 168 hours.  HbA1C: Hgb A1c MFr Bld  Date/Time Value Ref Range Status  10/22/2020 03:38 AM 5.3 4.8 - 5.6 % Final    Comment:    (NOTE)         Prediabetes: 5.7 - 6.4         Diabetes: >  6.4         Glycemic control for adults with diabetes: <7.0      CBG: Recent Labs  Lab 11/08/20 2313 11/09/20 0303 11/09/20 1213 11/09/20 1538 11/10/20 0809  GLUCAP 106* 106* 100* 102* 104*    The patient is critically ill with multiple organ systems failure and requires high complexity decision making for assessment and support, frequent evaluation and titration of therapies, application of advanced monitoring technologies and extensive interpretation of multiple databases. Critical Care Time devoted to patient care services described in this note is 35 minutes.    Christinia Gully, MD Pulmonary and [place] [phone]   After 7:00 pm call Elink  [phone]

## 2020-11-10 NOTE — Progress Notes (Addendum)
[facility] Progress Note  Subjective:  Pt is off of covid isolation now. Net neg 3.9 L yest. Filter clotting q 12 hrs now.   Vitals:   11/10/20 0615 11/10/20 0630 11/10/20 0700 11/10/20 0800  BP:  118/81 120/79 116/75  Pulse: (!) 106  (!) 107 (!) 118  Resp: (!) 30 (!) 30 (!) 24 (!) 27  Temp: (!) 96.62 F (35.9 C) (!) 96.98 F (36.1 C) (!) 97.16 F (36.2 C) 98.06 F (36.7 C)  TempSrc:      SpO2: 95%  95% 94%  Weight:      Height:        Exam: on vent ,sedated  no jvd  throat ett in place  Chest cta bilat and lat  Cor reg no RG  Abd soft ntnd no ascites   Ext diffuse 1-2+ nonpitting edema   Neuro on vent and sedated   Temp cath for CRRT      IV abx:  Vancomycin 12/5-12/6 and 12/11 x 1  Levaquin 12/7- 12/10 Cefepime 12/5 -12/7 and 12/11- current   UA >50 rbc, 11-20 wbc, rare bact, 30 prot   Renal US - IMPRESSION: Normal kidneys.  Bladder not visualized.    UNa 19, UCr 49      Wt's = admit 120kg/ peak 135kg > 130kg yest, 126 kg today       Assessment/ Plan: 1. Renal failure - acute kidney injury, nonoliguric w/ rising creat / BUN due to shock +/-  hemodynamics/ ^PEEP effects +/-COVID-19 effects. [place] w/o hydro, normal appearing kidneys. UA +hematuria/ pyuria. Creat 1.1 on admit, worsening up to 5.4 w/ BUN 178 so pt was started on CRRT 12/16.  UOP has dropped off now. Labs improving as expected. Adding heparin into circuit 500u/hr today for ^filter clotting. Cont CRRT.  2. BP/ volume - off pressors, BP's wnl. Tolerating UF 3.9 L off yest, wt's down , vol excess improved. BP's soft now, will keep even.  3. COVID pna/ ARDS - per CCM, mult infiltrates on CXR. As above.  4. Diarrhea - rectal tube 5. Stenotrophomonas PNA - per CCM     Rob Jordan 11/10/2020, 8:48 AM   Recent Labs  Lab 11/09/20 0322 11/09/20 1600 11/10/20 0335  K 3.4* 4.8 4.0  BUN 72* 62* 51*  CREATININE 2.39* 2.13* 1.91*  CALCIUM 7.6* 7.9* 8.0*  PHOS 4.3 4.9* 4.3  HGB 8.5*   --  9.2*   Inpatient medications: . artificial tears  1 application Both Eyes [id number]  . chlorhexidine gluconate (MEDLINE KIT)  15 mL Mouth Rinse BID  . Chlorhexidine Gluconate Cloth  6 each Topical Q0600  . collagenase   Topical Daily  . famotidine  20 mg Per Tube Daily  . feeding supplement (PIVOT 1.5 CAL)  1,000 mL Per Tube Q24H  . feeding supplement (PROSource TF)  90 mL Per Tube QID  . heparin injection (subcutaneous)  7,500 Units Subcutaneous Q8H  . HYDROmorphone HCl  3 mg Oral Q4H  . insulin aspart  0-15 Units Subcutaneous Q4H  . linagliptin  5 mg Per Tube Daily  . mouth rinse  15 mL Mouth Rinse 10 times per day  . metoCLOPramide (REGLAN) injection  5 mg Intravenous Q6H  . multivitamin  15 mL Per Tube Daily  . polyethylene glycol  17 g Per Tube Daily  . sodium chloride flush  10-40 mL Intracatheter Q12H  . thiamine  100 mg Per Tube Daily   .  prismasol BGK 4/2.5 400  mL/hr at 11/10/20 0136  .  prismasol BGK 4/2.5 200 mL/hr at 11/10/20 0134  . sodium chloride 10 mL/hr at 11/04/20 0800  . dextrose 25 mL/hr at 11/10/20 0700  . heparin 10,000 units/ 20 mL infusion syringe    . HYDROmorphone 4 mg/hr (11/10/20 0700)  . midazolam 10 mg/hr (11/10/20 0700)  . prismasol BGK 4/2.5 1,800 mL/hr at 11/10/20 0727   sodium chloride, acetaminophen (TYLENOL) oral liquid 160 mg/5 mL, alteplase, cisatracurium (NIMBEX) injection, heparin, heparin, HYDROmorphone, lip balm, midazolam, sodium chloride, sodium chloride flush, Thrombi-Pad

## 2020-11-10 NOTE — Progress Notes (Signed)
Upon providing oral care for patient, RN noticed gastric contents in patient's mouth. Tube feeding held. OG returned to low intermittent suction to prevent aspiration. Reported off to night shift RN. Will continue on suction with clamping following medications. Will continue to monitor.

## 2020-11-10 NOTE — Plan of Care (Signed)
Patient requiring sedation medications at this time, unable to tolerate advance activity.

## 2020-11-10 NOTE — Progress Notes (Signed)
Abg obtained on the following vent settings: PRVC mode, VT 623ml, rr 30, 50%, +5peep.  Results for Jackson Figueroa, Jackson Figueroa (MRN [id number]) as of 11/10/2020 06:40  Ref. Range 11/10/2020 06:25  Delivery systems Unknown VENTILATOR  FIO2 Unknown 50.00  Mode Unknown PRESSURE REGULATED VOLUME CONTROL  VT Latest Units: mL 620  Peep/cpap Latest Units: cm H20 5.0  pH, Arterial Latest Ref Range: 7.350 - 7.450  7.332 (L)  pCO2 arterial Latest Ref Range: 32.0 - 48.0 mmHg 46.9  pO2, Arterial Latest Ref Range: 83.0 - 108.0 mmHg 73.7 (L)  Acid-base deficit Latest Ref Range: 0.0 - 2.0 mmol/L 1.3  Bicarbonate Latest Ref Range: 20.0 - 28.0 mmol/L 24.5  O2 Saturation Latest Units: % 95.1  Patient temperature Unknown 36.0  Collection site Unknown LEFT RADIAL  Allens test (pass/fail) Latest Ref Range: PASS  PASS

## 2020-11-10 NOTE — Progress Notes (Signed)
Patient had an episode where his SpO2 dropped to 30% due to mucous plug.  Patient code was called but just had to be bagged and he had a pulse the whole episode.  Patient had just received a paralytic and suction by RT and RN prior to receiving it due to him being dyscrinic with the vent.  Patient has had tan/yellow/pink tinged after lavaged by RT.  BIS hooked up prior to starting paralytic and continued.  STAT ABG and CXR taken to adjust vent settings and r/o possible pneumothorax.

## 2020-11-10 NOTE — Progress Notes (Signed)
[place] Progress Note Patient Name: Jackson Figueroa DOB: [birth year] MRN: [id number]   Date of Service  11/10/2020  HPI/Events of Note  Still "guppy breathing" with evidence of ventilator asynchrony (breath stacking) in spite of increase sedation.   eICU Interventions  Plan: 1. Intermittent NRB per PCCM protocol.      Intervention Category Major Interventions: Respiratory failure - evaluation and management  Delanna Blacketer Eugene 11/10/2020, 12:16 AM

## 2020-11-10 NOTE — Progress Notes (Signed)
Abg obtained on the following vent settings: PRVC 612ml, rr25, 70%, +5peep  Results for Jackson Figueroa, Jackson Figueroa (MRN [id number]) as of 11/10/2020 03:49  Ref. Range 11/10/2020 02:30  Delivery systems Unknown VENTILATOR  FIO2 Unknown 70.00  Mode Unknown PRESSURE REGULATED VOLUME CONTROL  VT Latest Units: mL 620  Peep/cpap Latest Units: cm H20 5.0  pH, Arterial Latest Ref Range: 7.350 - 7.450  7.310 (L)  pCO2 arterial Latest Ref Range: 32.0 - 48.0 mmHg 49.0 (H)  pO2, Arterial Latest Ref Range: 83.0 - 108.0 mmHg 144 (H)  Acid-base deficit Latest Ref Range: 0.0 - 2.0 mmol/L 1.9  Bicarbonate Latest Ref Range: 20.0 - 28.0 mmol/L 24.4  O2 Saturation Latest Units: % 98.7  Patient temperature Unknown 35.9  Collection site Unknown LEFT RADIAL  Allens test (pass/fail) Latest Ref Range: PASS  PASS

## 2020-11-10 NOTE — Progress Notes (Signed)
[place] Progress Note Patient Name: Delphin Funes DOB: Sep 13, 1961 MRN: [id number]   Date of Service  11/10/2020  HPI/Events of Note  ABG on 50%/PRVC 25/TV 620/P 5 = 7.31/49/144.  eICU Interventions  Plan: 1. Increase PRVC rate to 30. 20 Repeat ABG at 6 AM.     Intervention Category Major Interventions: Respiratory failure - evaluation and management;Acid-Base disturbance - evaluation and management  Gessica Jawad Eugene 11/10/2020, 4:02 AM

## 2020-11-10 NOTE — Plan of Care (Signed)
Discussed with family over the phone in front of patient plan of care for the evening, pain management and dialysis with some teach back displayed by family.  And no evidence of learning by vented patient at this time.  Problem: Education: Goal: Knowledge of General Education information will improve Description: Including pain rating scale, medication(s)/side effects and non-pharmacologic comfort measures Outcome: Progressing

## 2020-11-11 ENCOUNTER — Inpatient Hospital Stay (HOSPITAL_COMMUNITY): Payer: BC Managed Care – PPO

## 2020-11-11 DIAGNOSIS — E46 Unspecified protein-calorie malnutrition: Secondary | ICD-10-CM | POA: Diagnosis present

## 2020-11-11 DIAGNOSIS — N179 Acute kidney failure, unspecified: Secondary | ICD-10-CM | POA: Diagnosis present

## 2020-11-11 DIAGNOSIS — G9341 Metabolic encephalopathy: Secondary | ICD-10-CM

## 2020-11-11 LAB — RENAL FUNCTION PANEL
Albumin: 2.1 g/dL — ABNORMAL LOW (ref 3.5–5.0)
Albumin: 2 g/dL — ABNORMAL LOW (ref 3.5–5.0)
Anion gap: 9 (ref 5–15)
Anion gap: 9 (ref 5–15)
BUN: 43 mg/dL — ABNORMAL HIGH (ref 6–20)
BUN: 39 mg/dL — ABNORMAL HIGH (ref 6–20)
CO2: 25 mmol/L (ref 22–32)
CO2: 23 mmol/L (ref 22–32)
Calcium: 8.2 mg/dL — ABNORMAL LOW (ref 8.9–10.3)
Calcium: 7.9 mg/dL — ABNORMAL LOW (ref 8.9–10.3)
Chloride: 100 mmol/L (ref 98–111)
Chloride: 101 mmol/L (ref 98–111)
Creatinine, Ser: 1.89 mg/dL — ABNORMAL HIGH (ref 0.61–1.24)
Creatinine, Ser: 1.66 mg/dL — ABNORMAL HIGH (ref 0.61–1.24)
GFR, Estimated: 41 mL/min — ABNORMAL LOW (ref >60)
GFR, Estimated: 48 mL/min — ABNORMAL LOW (ref >60)
Glucose, Bld: 108 mg/dL — ABNORMAL HIGH (ref 70–99)
Glucose, Bld: 124 mg/dL — ABNORMAL HIGH (ref 70–99)
Phosphorus: 4.3 mg/dL (ref 2.5–4.6)
Phosphorus: 4.4 mg/dL (ref 2.5–4.6)
Potassium: 4.5 mmol/L (ref 3.5–5.1)
Potassium: 4.3 mmol/L (ref 3.5–5.1)
Sodium: 134 mmol/L — ABNORMAL LOW (ref 135–145)
Sodium: 133 mmol/L — ABNORMAL LOW (ref 135–145)

## 2020-11-11 LAB — GLUCOSE, CAPILLARY
Glucose-Capillary: 101 mg/dL — ABNORMAL HIGH (ref 70–99)
Glucose-Capillary: 111 mg/dL — ABNORMAL HIGH (ref 70–99)
Glucose-Capillary: 93 mg/dL (ref 70–99)
Glucose-Capillary: 96 mg/dL (ref 70–99)
Glucose-Capillary: 96 mg/dL (ref 70–99)
Glucose-Capillary: 97 mg/dL (ref 70–99)

## 2020-11-11 LAB — CBC
HCT: 28.4 % — ABNORMAL LOW (ref 39.0–52.0)
Hemoglobin: 9.2 g/dL — ABNORMAL LOW (ref 13.0–17.0)
MCH: 32.9 pg (ref 26.0–34.0)
MCHC: 32.4 g/dL (ref 30.0–36.0)
MCV: 101.4 fL — ABNORMAL HIGH (ref 80.0–100.0)
Platelets: 141 10*3/uL — ABNORMAL LOW (ref 150–400)
RBC: 2.8 MIL/uL — ABNORMAL LOW (ref 4.22–5.81)
RDW: 14.4 % (ref 11.5–15.5)
WBC: 14.5 10*3/uL — ABNORMAL HIGH (ref 4.0–10.5)
nRBC: 0 % (ref 0.0–0.2)

## 2020-11-11 LAB — HEPATIC FUNCTION PANEL
ALT: 21 U/L (ref 0–44)
AST: 17 U/L (ref 15–41)
Albumin: 2.1 g/dL — ABNORMAL LOW (ref 3.5–5.0)
Alkaline Phosphatase: 74 U/L (ref 38–126)
Bilirubin, Direct: <0.1 mg/dL (ref 0.0–0.2)
Total Bilirubin: 0.5 mg/dL (ref 0.3–1.2)
Total Protein: 7.4 g/dL (ref 6.5–8.1)

## 2020-11-11 LAB — MAGNESIUM: Magnesium: 2.6 mg/dL — ABNORMAL HIGH (ref 1.7–2.4)

## 2020-11-11 LAB — APTT: aPTT: 41 seconds — ABNORMAL HIGH (ref 24–36)

## 2020-11-11 MED ORDER — VECURONIUM BROMIDE 10 MG IV SOLR
8.0000 mg | Freq: Once | INTRAVENOUS | Status: AC
  Administered 2020-11-11: 16:00:00 8 mg via INTRAVENOUS

## 2020-11-11 MED ORDER — CHLORHEXIDINE GLUCONATE CLOTH 2 % EX PADS
6.0000 | MEDICATED_PAD | Freq: Every day | CUTANEOUS | Status: DC
  Administered 2020-11-11 – 2020-12-18 (×42): 6 via TOPICAL

## 2020-11-11 MED ORDER — VALPROIC ACID 250 MG/5ML PO SOLN
250.0000 mg | Freq: Four times a day (QID) | ORAL | Status: DC
  Administered 2020-11-11 – 2020-11-13 (×12): 250 mg
  Filled 2020-11-11 (×15): qty 5

## 2020-11-11 MED ORDER — HEPARIN SODIUM (PORCINE) 5000 UNIT/ML IJ SOLN
5000.0000 [IU] | Freq: Three times a day (TID) | INTRAMUSCULAR | Status: DC
  Administered 2020-11-12 – 2020-11-17 (×14): 5000 [IU] via SUBCUTANEOUS
  Filled 2020-11-11 (×14): qty 1

## 2020-11-11 MED ORDER — STERILE WATER FOR INJECTION IJ SOLN
INTRAMUSCULAR | Status: AC
  Filled 2020-11-11: qty 10

## 2020-11-11 MED ORDER — VECURONIUM BROMIDE 10 MG IV SOLR
INTRAVENOUS | Status: AC
  Filled 2020-11-11: qty 10

## 2020-11-11 NOTE — Progress Notes (Signed)
Patient having episodes of not being synchrony with the Vent but being controlled by prn Versed and Dilaudid boluses at this time.  Patient does have some ST elevation with heart in 120's-130's.  E-link called since he went into A-fib with RVR on a previous day.  Patient temperature is fluctuating between the need for ice packs and warming blanket.  12 lead EKG obtained as well.  Will continue to monitor.

## 2020-11-11 NOTE — Progress Notes (Signed)
[facility] Progress Note  Subjective: Heparin in filter added yesterday - doing OK  Vitals:   11/11/20 1200 11/11/20 1235 11/11/20 1300 11/11/20 1400  BP: 113/74  125/78 110/67  Pulse: (!) 116  (!) 118 (!) 120  Resp: _0 Temp: 98.96 F (37.2 C)  98.96 F (37.2 C) 98.96 F (37.2 C)  TempSrc:      SpO2: 98% 98% 98% 100%  Weight:      Height:  6' (1.829 m)      Exam: GEN: on vent HEENT ETT in place, OGT in place with significant output NECK no JVD PULM coarse bilateral CV RRR ABD soft, hypoactive BS EXT 1+ anasarca NEURO deeply sedated MSK: L hand with cool/ mottled nail beds          Assessment/ Plan: 1. Renal failure - acute kidney injury, nonoliguric w/ rising creat / BUN due to shock +/-  hemodynamics/ ^PEEP effects +/-COVID-19 effects. [place] w/o hydro, normal appearing kidneys. UA +hematuria/ pyuria. Creat 1.1 on admit, worsening up to 5.4 w/ BUN 178 so pt was started on CRRT 12/16.  UOP has dropped off now. Labs improving as expected. Adding heparin into circuit 500u/hr today for ^filter clotting. Cont CRRT.  2. BP/ volume - off pressors, BP's wnl. Tolerating UF 3.9 L off yest, wt's down , vol excess improved. BP's soft now, will keep even.  3. COVID pna/ ARDS - per CCM, mult infiltrates on CXR. As above.  4. Diarrhea - rectal tube 5. Stenotrophomonas PNA - per CCM    Madelon Lips MD 11/11/2020, 3:11 PM   Recent Labs  Lab 11/10/20 0335 11/10/20 1843 11/11/20 0312  K 4.0 4.3 4.5  BUN 51* 48* 43*  CREATININE 1.91* 1.73* 1.89*  CALCIUM 8.0* 8.0* 8.2*  PHOS 4.3 4.2 4.3  HGB 9.2*  --  9.2*   Inpatient medications: . acetylcysteine  2 mL Nebulization QID  . albuterol  2.5 mg Nebulization QID  . artificial tears  1 application Both Eyes A1O  . chlorhexidine gluconate (MEDLINE KIT)  15 mL Mouth Rinse BID  . Chlorhexidine Gluconate Cloth  6 each Topical Q0600  . collagenase   Topical Daily  . famotidine  20 mg Per Tube Daily  .  feeding supplement (PIVOT 1.5 CAL)  1,000 mL Per Tube Q24H  . feeding supplement (PROSource TF)  90 mL Per Tube QID  . heparin injection (subcutaneous)  7,500 Units Subcutaneous Q8H  . HYDROmorphone HCl  3 mg Oral Q4H  . linagliptin  5 mg Per Tube Daily  . mouth rinse  15 mL Mouth Rinse 10 times per day  . metoCLOPramide (REGLAN) injection  10 mg Intravenous Q6H  . multivitamin  15 mL Per Tube Daily  . sodium chloride flush  10-40 mL Intracatheter Q12H  . thiamine  100 mg Per Tube Daily  . valproic acid  250 mg Per Tube QID   .  prismasol BGK 4/2.5 400 mL/hr at 11/11/20 0410  .  prismasol BGK 4/2.5 200 mL/hr at 11/11/20 8786  . sodium chloride 10 mL/hr at 11/04/20 0800  . heparin 10,000 units/ 20 mL infusion syringe 500 Units/hr (11/11/20 0506)  . HYDROmorphone 3.5 mg/hr (11/11/20 1500)  . midazolam 8 mg/hr (11/11/20 1500)  . prismasol BGK 4/2.5 1,800 mL/hr at 11/11/20 1354   sodium chloride, acetaminophen (TYLENOL) oral liquid 160 mg/5 mL, alteplase, heparin, heparin, HYDROmorphone, lip balm, midazolam, sodium chloride, sodium chloride flush, Thrombi-Pad

## 2020-11-11 NOTE — Progress Notes (Signed)
Nutrition Follow-up  DOCUMENTATION CODES:   Obesity unspecified  INTERVENTION:  - resume TF and advance to goal per CCM discretion.   NUTRITION DIAGNOSIS:   Increased nutrient needs related to acute illness,catabolic illness ([id number] infection) as evidenced by estimated needs -ongoing  GOAL:   Provide needs based on ASPEN/SCCM guidelines -unmet/unable to meet  MONITOR:   Vent status,Labs,Weight trends,Skin,Other (Comment) (plan concerning nutrition support)  ASSESSMENT:   59 year old male with no significant medical history. He was found to be COVID-19 positive on 11/26. He presented to [facility] with profound hypoxemia on 11/28 and was emergently intubated in the ED; found to be in ARDS and transferred to [facility].  Significant Events: 11/28 - admission; emergent intubation; OGT placed; transfer from [facility] to [facility] 11/29- trickle TF initiation; proned 11/30- initial RD assessment; proned 12/1- begin TF rate advancement; proning protocol stopped 12/3- TF held due to copious thick, tan secretions; re-started and advancing TF later in the day 12/5- TF held due to copious thick, tan secretions; re-started trickle TF later in the day; proned 12/6- proned; advancing TF rate 12/7- proned 12/8- TF held d/t large volume aspiration; proned; reglan started 12/14- TF increased from 10 ml/hr to 20 ml/hr 12/16- CRRT started    TF currently on hold. Able to talk with RN at bedside. Plan to keep TF off for the remainder of today and TF has been off since yesterday afternoon d/t high gastric residuals.  Patient remains intubated with OGT to LIS. Small bore NGT remains in place in R nare. Abdominal xray report from earlier today indicates tip of OGT is in the stomach and tip of small bore NGT is in the proximal duodenum.   Weight is trending down consistently since CRRT start. Current weight is 270 lb and weight on admission date of 11/28 was 264 lb. Mild to moderate pitting edema to  all extremities, perineal area, and sacrum documented in the flow sheet.   Per notes: - patient will require trach with plan to consult ENT for this - acute metabolic encephalopathy which is a barrier to extubation - vomiting, slow GI transit--reglan order remains in place, ongoing high gastric residual/output - aiming for negative volume status with CRRT - now off precautions for COVID    Patient is currently intubated on ventilator support MV: 16 L/min Temp (24hrs), Avg:99.1 F (37.3 C), Min:96.8 F (36 C), Max:100.58 F (38.1 C) Propofol: none  Labs reviewed; CBGs: 97, 96 mg/dl, Na: 134 mmol/l, BUN: 43 mg/dl, creatinine: 1.89 mg/dl, Ca: 8.2 mg/dl, Mg: 2.6 mg/dl, GFR: 41 ml/min.  Medications reviewed; 20 mg pepcid/day, 5 mg tradjenta/day, 10 mg IV reglan QID, 15 ml multivitamin/day, 100 mg thiamine/day. Drip; versed @ 7 mg/hr.   Diet Order:   Diet Order            Diet NPO time specified  Diet effective now                 EDUCATION NEEDS:   No education needs have been identified at this time  Skin:  Skin Assessment: Skin Integrity Issues: Skin Integrity Issues:: DTI,Unstageable DTI: sacrum Unstageable: full thickness to R nose  Last BM:  12/20 (type 7)  Height:   Ht Readings from Last 1 Encounters:  11/11/20 6' (1.829 m)    Weight:   Wt Readings from Last 1 Encounters:  11/11/20 122.4 kg    Estimated Nutritional Needs:  Kcal:  2850 kcal (30 kcal/kg adjBW) Protein:  215-230 grams Fluid:  >/=  4 L/day     Jarome Matin, MS, RD, LDN, CNSC Inpatient Clinical Dietitian RD pager # available in [facility]  After hours/weekend pager # available in [facility]

## 2020-11-11 NOTE — Progress Notes (Signed)
NAME:  Jackson Figueroa, MRN:  [id number], DOB:  1960-11-30, LOS: 67 ADMISSION DATE:  10/20/2020, CONSULTATION DATE:  11/29 REFERRING MD:  Sabra Heck, CHIEF COMPLAINT:  ARDS COVID 59   Brief History   59 year old male with no significant past medical history and a positive COVID-19 test on 11/26 presented to [facility] with profound hypoxemia on 11/28.  Intubated immediately upon arrival found to be in ARDS. Transferred to [facility] long hospital for ongoing ICU care in the early a.m. hours of 11/29.  Past Medical History  Obesity  Significant Hospital Events   11/28 Presented to [facility] ER, intubated for hypoxemia, empiric abx, shock on levo. COVID Rx 11/29 Persistent shock, central line placed, ARDS protocol continued, proning initiated 11/30 Pressor requirements continued, NMB initiated to facilitate ventilation tube feeds started, initiated proning 12/01 Stopping NMB, proning positioning protocol discontinued, free water for rising creatinine and sodium 12/02 PEEP trending down.  Renal function improved. Changed to versed from prop gtt d/t rising triglycerides  12/03 Remdesivir completed. Peep/FIO2 stable. Free water adjusted. Solumedrol reduced, fentanyl to Dilaudid, adding Oxycodone  12/04 Dyssynchrony, given paralytic overnight, changed to ketamine; vomited / aspirated, restarted paralytics 12/06 Proned, under NMB 12/07 Proning, NMB continued. Switched antibiotics for stenotrophomonas 12/13 Stopped oxycodone, added dilaudid oral, continuous nimbex infusion to prn, palliative care consulted 12/14 Changed to pressure control 12/15 Worsening vent synchrony after clamping OG tube 12/18 added reglan IV for ? Ileus developing  12/18 back on paralytics for "guppy breathing/ air stacking" on PCV ? ,mucus plug  12/19  Back on PCV and off paralytics  12/20 having intermittent fevers, atrial fibrillation with some ST changes.  Still requiring breakthrough Dilaudid boluses for ventilator synchrony,  still having Significant gastric residual, refluxing gastric content into mouth  Consults:  Nephrology 12/14 Palliative 12/13 ID signed off 12/17  Procedures:  ETT 11/28 >> CVL 11/29 >> 12/9 R PICC 12/9 >>  R IJ HD Cath 12/17 >>  Significant Diagnostic Tests:  ECHO 11/29 >> LVEF 50-55%, mild LVH, RV systolic function mildly reduced. Mild dilation of ascending aorta 71mm. LE Venous Duplex 12/8 >> negative for DVT   Micro Data:  COVID 11/28 >> Positive  Influenza A/B 11/28 >> negative  UC 11/28 >> negative  BCx2 11/28 >> negative  Tracheal aspirate 12/5 >> Stenotrophomonas maltophilia >> S-bactrim, levofloxacin UC 12/11 >> negative  Tracheal aspirate 12/11 >> Stenotrophomonas maltophilia >> S-bactrim, levofloxacin  BCx2 12/11 >> negative  Trach 12/ 19 >>> gram-negative rods  Antimicrobials/covid rx:  CTX 11/28 > 11/29 Azithromycin 11/28 > 11/29 Remdesivir 11/28 > 12/2 Prednisone 11/28 >  Baricitinib 12/4 > 12/5 Vanco 12/5 >> 12/7, 12/11 x1 Cefepime 12/5 >> 12/7, 12/11 >> 12/13 Levaquin 12/7 >> 12/16   Interim history/subjective:   Worsening gastric residual again Objective   Blood pressure 91/60, pulse (Abnormal) 120, temperature 99.5 F (37.5 C), resp. rate 15, height 6' (1.829 m), weight 122.4 kg, SpO2 93 %.    Vent Mode: PCV FiO2 (%):  [40 %] 40 % Set Rate:  [15 bmp] 15 bmp PEEP:  [5 cmH20] 5 cmH20 Plateau Pressure:  [24 cmH20-25 cmH20] 25 cmH20   Intake/Output Summary (Last 24 hours) at 11/11/2020 4097 Last data filed at 11/11/2020 0900 Gross per 24 hour  Intake 1498.21 ml  Output 2081 ml  Net -582.79 ml   Filed Weights   11/09/20 0500 11/10/20 0335 11/11/20 0420  Weight: 130 kg 126.2 kg 122.4 kg    Examination:  General 59 year old white male  who remains ventilator dependent HEENT orally intubated, bilious appearing output from NG tube Pulmonary: Coarse scattered diffuse rhonchi, currently pressure control 8, PEEP 8, tidal volumes in the  800s Cardiac: Tachycardic regular rhythm Abdomen: Soft not tender, bowel sounds present.  Continues to have high gastric residuals GU anuric Neuro: Heavily sedated Extremities warm and dry   Resolved Hospital Problem list     Assessment & Plan:   ARDS due to COVID 19 PNA, with ongoing ventilator dependence and inability to wean Ventilator compliance/synchrony is been large barrier, sedation requirements continue to be significant Also concern about possible aspiration given high volume of gastric residual Portable chest x-ray personally reviewed This is from 12/19 shows endotracheal tube in satisfactory position, there continues to be diffuse and bilateral airspace disease  Will need tracheostomy Plan Continue pressure control ventilation Aggressive volume removal as able Continue PAD protocol with RASS goal -1 to -2 We will consult ENT, unfortunately none of our providers on this week credentialed for tracheostomy VAP bundle  Stenotrophomonas pneumonia, persistent on repeat culture; improvement clinically as of 12/14 - ID off 12/17  Continues to spike intermittent fever, white blood cell count climbing once again -Antibiotics had been discontinued as of 12/14 -sputum culture sent 12/19 growing gram-negative rods, completed course of Levaquin on 12/14 Plan Await pending culture Continue to trend white blood cell count and fever count   Acute metabolic encephalopathy -This is been his primary barrier to extubation Plan Continue RASS goal -1 to -2 Klonopin increased 12/18 to 2 mg via tube every 6 hours Dilaudid drip with as needed Dilaudid, may need to consider transition to methadone Start depakote   Vomiting, slow GI transit rule out ileus -TF restarted 12/17, no further vomiting -Continues to have high gastric output Plan Continue Reglan Repeat 1 view abdomen to ensure feeding tube is in duodenum  Non-oliguric AKI   -Has been on CRRT, metabolic status and renal  function stable Plan CRRT per nephrology Aiming for negative volume status Daily chemistries  Fluid and electrolyte imbalance hyponatremia 12/18 > worse 12/19  Plan Continue to trend chemistries daily   Diarrhea Plan Continue to trend white blood cell count and fever curve  Anemia of acute dz Hemoglobin is been stable Plan Continue to trend daily CBC  Best practice (evaluated daily)  Diet: tube feeding; placed on hold 12/20 Pain/Anxiety/Delirium protocol (if indicated): as above VAP protocol (if indicated): yes DVT prophylaxis: heparin sq  GI prophylaxis: famotidine Glucose control: off Mobility: bed rest Last date of multidisciplinary goals of care discussion 12/14 with sister.   Family and staff present n/a Summary of discussion n/a Follow up goals of care discussion due: 12/22 Code Status: full code  Disposition: ICU  My cct 32 minutes  Erick Colace ACNP-BC [place] Pager # [phone] OR # [phone] if no answer

## 2020-11-11 NOTE — TOC Progression Note (Signed)
Transition of Care [facility]) - Progression Note    Patient Details  Name: Jackson Figueroa MRN: [id number] Date of Birth: 01/28/1961  Transition of Care [facility]) CM/SW Contact  Leeroy Cha, RN Phone Number: 11/11/2020, 2:49 PM  Clinical Narrative:    [place] [facility] Events   11/28 Presented to [facility] ER, intubated for hypoxemia, empiric abx, shock on levo. COVID Rx 11/29 Persistent shock, central line placed, ARDS protocol continued, proning initiated 11/30 Pressor requirements continued, NMB initiated to facilitate ventilation tube feeds started, initiated proning 12/01 Stopping NMB, proning positioning protocol discontinued, free water for rising creatinine and sodium 12/02 PEEP trending down. Renal function improved. Changed to versed from prop gtt d/t rising triglycerides  12/03 Remdesivir completed. Peep/FIO2 stable. Free water adjusted. Solumedrol reduced, fentanyl to Dilaudid, adding Oxycodone 12/04 Dyssynchrony, given paralytic overnight, changed to ketamine; vomited / aspirated, restarted paralytics 12/06 Proned, under NMB 12/07 Proning, NMB continued. Switched antibiotics for stenotrophomonas 12/13 Stopped oxycodone, added dilaudid oral, continuous nimbex infusion to prn, palliative care consulted 12/14 Changed to pressure control 12/15 Worsening vent synchrony after clamping OG tube 12/18 added reglan IV for ? Ileus developing  12/18 back on paralytics for "guppy breathing/ air stacking" on PCV ? ,mucus plug  12/19  Back on PCV and off paralytics  12/20 having intermittent fevers, atrial fibrillation with some ST changes.  Still requiring breakthrough Dilaudid boluses for ventilator synchrony, still having Significant gastric residual, refluxing gastric content into mouth  Following for toc needs   Expected Discharge Plan: Home/Self Care    Expected Discharge Plan and Services Expected Discharge Plan: Home/Self Care   Discharge Planning Services: CM Consult   Living  arrangements for the past 2 months: Single Family Home                                       Social Determinants of Health (SDOH) Interventions    Readmission Risk Interventions No flowsheet data found.

## 2020-11-11 NOTE — Plan of Care (Addendum)
Discussed with nephrology md on-call in front of patient: patient's filter getting clotted within <12 hours.  Increased Heparin through CRRT and decreased SQ Heparin at this time.  With no evidence of learning from the Vented patient at this time.  Problem: Clinical Measurements: Goal: Ability to maintain clinical measurements within normal limits will improve Outcome: Progressing

## 2020-11-12 ENCOUNTER — Inpatient Hospital Stay (HOSPITAL_COMMUNITY): Payer: BC Managed Care – PPO

## 2020-11-12 DIAGNOSIS — J1282 Pneumonia due to coronavirus disease 2019: Secondary | ICD-10-CM

## 2020-11-12 DIAGNOSIS — Z515 Encounter for palliative care: Secondary | ICD-10-CM

## 2020-11-12 DIAGNOSIS — B9729 Other coronavirus as the cause of diseases classified elsewhere: Secondary | ICD-10-CM

## 2020-11-12 DIAGNOSIS — Z7189 Other specified counseling: Secondary | ICD-10-CM

## 2020-11-12 LAB — RENAL FUNCTION PANEL
Albumin: 2.1 g/dL — ABNORMAL LOW (ref 3.5–5.0)
Albumin: 2 g/dL — ABNORMAL LOW (ref 3.5–5.0)
Albumin: 2.2 g/dL — ABNORMAL LOW (ref 3.5–5.0)
Anion gap: 10 (ref 5–15)
Anion gap: 10 (ref 5–15)
Anion gap: 11 (ref 5–15)
BUN: 36 mg/dL — ABNORMAL HIGH (ref 6–20)
BUN: 42 mg/dL — ABNORMAL HIGH (ref 6–20)
BUN: 46 mg/dL — ABNORMAL HIGH (ref 6–20)
CO2: 24 mmol/L (ref 22–32)
CO2: 23 mmol/L (ref 22–32)
CO2: 22 mmol/L (ref 22–32)
Calcium: 8.3 mg/dL — ABNORMAL LOW (ref 8.9–10.3)
Calcium: 8.3 mg/dL — ABNORMAL LOW (ref 8.9–10.3)
Calcium: 8.2 mg/dL — ABNORMAL LOW (ref 8.9–10.3)
Chloride: 100 mmol/L (ref 98–111)
Chloride: 100 mmol/L (ref 98–111)
Chloride: 100 mmol/L (ref 98–111)
Creatinine, Ser: 1.78 mg/dL — ABNORMAL HIGH (ref 0.61–1.24)
Creatinine, Ser: 2.04 mg/dL — ABNORMAL HIGH (ref 0.61–1.24)
Creatinine, Ser: 2 mg/dL — ABNORMAL HIGH (ref 0.61–1.24)
GFR, Estimated: 44 mL/min — ABNORMAL LOW (ref >60)
GFR, Estimated: 37 mL/min — ABNORMAL LOW (ref >60)
GFR, Estimated: 38 mL/min — ABNORMAL LOW (ref >60)
Glucose, Bld: 98 mg/dL (ref 70–99)
Glucose, Bld: 149 mg/dL — ABNORMAL HIGH (ref 70–99)
Glucose, Bld: 141 mg/dL — ABNORMAL HIGH (ref 70–99)
Phosphorus: 4 mg/dL (ref 2.5–4.6)
Phosphorus: 5.3 mg/dL — ABNORMAL HIGH (ref 2.5–4.6)
Phosphorus: 4.5 mg/dL (ref 2.5–4.6)
Potassium: 4.3 mmol/L (ref 3.5–5.1)
Potassium: 5.6 mmol/L — ABNORMAL HIGH (ref 3.5–5.1)
Potassium: 5.8 mmol/L — ABNORMAL HIGH (ref 3.5–5.1)
Sodium: 134 mmol/L — ABNORMAL LOW (ref 135–145)
Sodium: 133 mmol/L — ABNORMAL LOW (ref 135–145)
Sodium: 133 mmol/L — ABNORMAL LOW (ref 135–145)

## 2020-11-12 LAB — GLUCOSE, CAPILLARY
Glucose-Capillary: 100 mg/dL — ABNORMAL HIGH (ref 70–99)
Glucose-Capillary: 100 mg/dL — ABNORMAL HIGH (ref 70–99)
Glucose-Capillary: 110 mg/dL — ABNORMAL HIGH (ref 70–99)
Glucose-Capillary: 119 mg/dL — ABNORMAL HIGH (ref 70–99)
Glucose-Capillary: 128 mg/dL — ABNORMAL HIGH (ref 70–99)
Glucose-Capillary: 147 mg/dL — ABNORMAL HIGH (ref 70–99)

## 2020-11-12 LAB — CBC
HCT: 28.3 % — ABNORMAL LOW (ref 39.0–52.0)
Hemoglobin: 8.8 g/dL — ABNORMAL LOW (ref 13.0–17.0)
MCH: 33.1 pg (ref 26.0–34.0)
MCHC: 31.1 g/dL (ref 30.0–36.0)
MCV: 106.4 fL — ABNORMAL HIGH (ref 80.0–100.0)
Platelets: 124 10*3/uL — ABNORMAL LOW (ref 150–400)
RBC: 2.66 MIL/uL — ABNORMAL LOW (ref 4.22–5.81)
RDW: 14.6 % (ref 11.5–15.5)
WBC: 11.9 10*3/uL — ABNORMAL HIGH (ref 4.0–10.5)
nRBC: 0 % (ref 0.0–0.2)

## 2020-11-12 LAB — MAGNESIUM: Magnesium: 2.7 mg/dL — ABNORMAL HIGH (ref 1.7–2.4)

## 2020-11-12 LAB — C-REACTIVE PROTEIN: CRP: 25.1 mg/dL — ABNORMAL HIGH (ref <1.0)

## 2020-11-12 LAB — APTT: aPTT: 47 seconds — ABNORMAL HIGH (ref 24–36)

## 2020-11-12 LAB — SEDIMENTATION RATE: Sed Rate: >140 mm/hr — ABNORMAL HIGH (ref 0–16)

## 2020-11-12 MED ORDER — MINOCYCLINE HCL 50 MG PO CAPS
100.0000 mg | ORAL_CAPSULE | Freq: Two times a day (BID) | ORAL | Status: DC
  Administered 2020-11-13 – 2020-11-14 (×3): 100 mg
  Filled 2020-11-12 (×4): qty 2

## 2020-11-12 MED ORDER — HEPARIN (PORCINE) 2000 UNITS/L FOR CRRT
INTRAVENOUS_CENTRAL | Status: DC | PRN
  Filled 2020-11-12 (×5): qty 1000
  Filled 2020-11-12: qty 2000
  Filled 2020-11-12 (×3): qty 1000

## 2020-11-12 MED ORDER — ALTEPLASE 2 MG IJ SOLR
2.0000 mg | Freq: Once | INTRAMUSCULAR | Status: AC
  Administered 2020-11-12: 03:00:00 2 mg
  Filled 2020-11-12: qty 2

## 2020-11-12 MED ORDER — METHYLPREDNISOLONE SODIUM SUCC 125 MG IJ SOLR
80.0000 mg | Freq: Two times a day (BID) | INTRAMUSCULAR | Status: DC
  Administered 2020-11-12 – 2020-11-13 (×3): 80 mg via INTRAVENOUS
  Filled 2020-11-12 (×3): qty 2

## 2020-11-12 MED ORDER — MINOCYCLINE HCL 50 MG PO CAPS
200.0000 mg | ORAL_CAPSULE | Freq: Once | ORAL | Status: AC
  Administered 2020-11-12: 16:00:00 200 mg
  Filled 2020-11-12: qty 4
  Filled 2020-11-12: qty 2

## 2020-11-12 NOTE — Progress Notes (Addendum)
NAME:  Jackson Figueroa, MRN:  [id number], DOB:  [birth year], LOS: 23 ADMISSION DATE:  10/20/2020, CONSULTATION DATE:  11/29 REFERRING MD:  Sabra Heck, CHIEF COMPLAINT:  ARDS COVID 84   Brief History  59 year old male with no significant past medical history and a positive COVID-19 test on 11/26 presented to [facility] with profound hypoxemia on 11/28.  Intubated immediately upon arrival found to be in ARDS. Transferred to [facility] long hospital for ongoing ICU care in the early a.m. hours of 11/29.  Past Medical History  Obesity  Significant Hospital Events   11/28 Presented to [facility] ER, intubated for hypoxemia, empiric abx, shock on levo. COVID Rx 11/29 Persistent shock, central line placed, ARDS protocol continued, proning initiated 11/30 Pressor requirements continued, NMB initiated to facilitate ventilation tube feeds started, initiated proning 12/01 Stopping NMB, proning positioning protocol discontinued, free water for rising creatinine and sodium 12/02 PEEP trending down.  Renal function improved. Changed to versed from prop gtt d/t rising triglycerides  12/03 Remdesivir completed. Peep/FIO2 stable. Free water adjusted. Solumedrol reduced, fentanyl to Dilaudid, adding Oxycodone  12/04 Dyssynchrony, given paralytic overnight, changed to ketamine; vomited / aspirated, restarted paralytics 12/06 Proned, under NMB 12/07 Proning, NMB continued. Switched antibiotics for stenotrophomonas 12/13 Stopped oxycodone, added dilaudid oral, continuous nimbex infusion to prn, palliative care consulted 12/14 Changed to pressure control 12/15 Worsening vent synchrony after clamping OG tube 12/18 added reglan IV for ? Ileus developing  12/18 back on paralytics for "guppy breathing/ air stacking" on PCV ? ,mucus plug  12/19  Back on PCV and off paralytics  12/20 having intermittent fevers, atrial fibrillation with some ST changes.  Still requiring breakthrough Dilaudid boluses for ventilator synchrony,  still having Significant gastric residual, refluxing gastric content into mouth 12/21: Infectious disease reengaged for stenotrophomonas multidrug-resistant in sputum, added systemic steroids Consults:  Nephrology 12/14 Palliative 12/13 ID signed off 12/17  Procedures:  ETT 11/28 >> CVL 11/29 >> 12/9 R PICC 12/9 >>  R IJ HD Cath 12/17 >>  Significant Diagnostic Tests:  ECHO 11/29 >> LVEF 50-55%, mild LVH, RV systolic function mildly reduced. Mild dilation of ascending aorta 18mm. LE Venous Duplex 12/8 >> negative for DVT   Micro Data:  COVID 11/28 >> Positive  Influenza A/B 11/28 >> negative  UC 11/28 >> negative  BCx2 11/28 >> negative  Tracheal aspirate 12/5 >> Stenotrophomonas maltophilia >> S-bactrim, levofloxacin UC 12/11 >> negative  Tracheal aspirate 12/11 >> Stenotrophomonas maltophilia >> S-bactrim, levofloxacin  BCx2 12/11 >> negative  Trach 12/ 19 >>> multidrug resistance stenotrophomonas  Antimicrobials/covid rx:  CTX 11/28 > 11/29 Azithromycin 11/28 > 11/29 Remdesivir 11/28 > 12/2 Prednisone 11/28 >  Baricitinib 12/4 > 12/5 Vanco 12/5 >> 12/7, 12/11 x1 Cefepime 12/5 >> 12/7, 12/11 >> 12/13 Levaquin 12/7 >> 12/16  .meds Interim history/subjective:   Still requiring intermittent sedation and neuromuscular blockade Objective   Blood pressure (Abnormal) 106/59, pulse (Abnormal) 117, temperature 99.5 F (37.5 C), resp. rate 16, height 6' (1.829 m), weight 122.4 kg, SpO2 94 %.    Vent Mode: CPAP;PSV FiO2 (%):  [45 %-60 %] 45 % Set Rate:  [15 bmp] 15 bmp PEEP:  [5 cmH20] 5 cmH20 Pressure Support:  [10 cmH20] 10 cmH20 Plateau Pressure:  [20 cmH20] 20 cmH20   Intake/Output Summary (Last 24 hours) at 11/12/2020 1020 Last data filed at 11/12/2020 0900 Gross per 24 hour  Intake 1674 ml  Output 2036 ml  Net -362 ml   [employer]  11/10/20 0335 11/11/20 0420 11/12/20 0500  Weight: 126.2 kg 122.4 kg 122.4 kg    Examination:  General this is a  59 year old white male, also dependent on high dosing of sedation HEENT orally intubated mucous membranes moist no JVD oral gastric tube with bilious appearing output Pulmonary: Coarse and diffuse rhonchi with some expiratory wheezing.  Currently on pressure support ventilation Cardiac: Regular rate, tachycardic Extremities: Warm and dry with brisk capillary refill Neuro: Heavily sedated. Minimal urine output GU  Resolved Hospital Problem list     Assessment & Plan:   ARDS due to COVID 19 PNA, with ongoing ventilator dependence and inability to wean Continues to require intermittent neuromuscular blockade and high sedation.  When adequately sedated his ventilator mechanics Are actually quite good, however I do not think given his degree of Covid related lung injury and recurrent ventilator associated pneumonia he will be liberated from the ventilator without tracheostomy -Portable chest x-ray personally reviewed shows his right PICC line has been pulled back.  Comparing film from the 19th he continues to have diffuse bilateral airspace disease without significant change Plan Volume removal as able via CRRT PAD protocol RASS goal -2 Treat pneumonia Systemic steroids started for possible component of COPD exacerbation Continue scheduled bronchodilators VAP bundle Will speak to family, needs trach.  Will speak with ENT, see if they can assist [place] if no availability until the 27th then we can do it at that point   Stenotrophomonas pneumonia, persistent on repeat culture; now multidrug-resistant, treating as an active infection Plan Infectious diseases been consulted, appreciate assistance   Acute metabolic encephalopathy -This is been his primary barrier to extubation Plan Continue RASS goal -1  Klonopin increased to 2 mg via tube every 6 hours on 12/18  Continues to be on Dilaudid drip and as needed Dilaudid  Started Depakote on 12/21  We will discuss with pharmacy, may be able to  start methadone taper at some point, QTC management will be the limiting factor here  Vomiting, slow GI transit rule out ileus -TF restarted 12/17, no further vomiting -Continues to have high gastric output Plan Continue Reglan The feeding tube is well within the proximal duodenum, will we can resume trickle feeds Okay to place nasogastric tube to low intermittent suction if needed for now we will clamp, if continues to be a problem may discuss this with GI, will get prealbumin 12/22  Non-oliguric AKI   -Has been on CRRT, metabolic status and renal function stable Plan Volume removal as tolerated A.m. chemistry May be able to think about intermittent dialysis soon   Fluid and electrolyte imbalance hyponatremia 12/18 > worse 12/19; now better Plan Continue to trend daily   Diarrhea Plan Continue to monitor  Anemia of acute dz Hemoglobin is been stable Plan Continue to trend CBC and transfuse for hemoglobin less Then 7  Best practice (evaluated daily)  Diet: tube feeding; placed on hold 12/20, resuming postpyloric feedings 12/21 Pain/Anxiety/Delirium protocol (if indicated): as above, continues to require high dose sedation VAP protocol (if indicated): yes DVT prophylaxis: heparin sq  GI prophylaxis: famotidine Glucose control: off Mobility: bed rest Last date of multidisciplinary goals of care discussion 12/14 with sister.   Family and staff present n/a Summary of discussion n/a Follow up goals of care discussion due: 12/22; will reach out to family once again. Code Status: full code  Disposition: ICU  My critical care x32 minutes  Erick Colace ACNP-BC [place] Pager # [phone] OR # [phone]  if no answer   I, Dr. Shellee Milo, have personally reviewed patient's available data, including medical history, events of note, physical examination and test results as part of my evaluation. I have discussed with NP Kary Kos  and other care  providers all aspects of the patients PCCM issues as listed.  In addition,  I have personally evaluated the patient and assisted in the formulation of the management plan.     Lab Results  Component Value Date   ESRSEDRATE >140 (H) 11/12/2020    I added steroids back today as his problem appears to be more airway dynamic than parenchymal in nature and not convinced unaddressed infection is an issue but appreciate ID's input in this regard.  Christinia Gully, MD Pulmonary and [place] [phone]   After 7:00 pm call Elink  [phone]

## 2020-11-12 NOTE — Progress Notes (Addendum)
[facility] for Infectious Disease  Total days of antibiotics 1               Reason for Consult: stenotrophomas pneumonia   Referring Physician: Bonner Puna  Active Problems:   Acute respiratory failure due to COVID-19 [facility])   Acute respiratory distress syndrome (ARDS) due to COVID-19 virus ([place])   Septic shock ([place])   AKI (acute kidney injury) ([place])   Protein calorie malnutrition ([place])   Acute metabolic encephalopathy    HPI: Jackson Figueroa is a 59 y.o. male  With respiratory distress, ARDS remains intubated. With intermittent fevers, possibly worsening settings. Has had hx of stenotrophomonas on Cx from 12/11 but now has worsening sensitivities. CXR showing multifolcal pulmonary infiltrates Remains on CRRT. Having fevers for the past 24hrs Past Medical History:  Diagnosis Date  . Obesity     Allergies: No Known Allergies   MEDICATIONS: . acetylcysteine  2 mL Nebulization QID  . albuterol  2.5 mg Nebulization QID  . artificial tears  1 application Both Eyes S8P  . chlorhexidine gluconate (MEDLINE KIT)  15 mL Mouth Rinse BID  . Chlorhexidine Gluconate Cloth  6 each Topical Q0600  . collagenase   Topical Daily  . famotidine  20 mg Per Tube Daily  . feeding supplement (PIVOT 1.5 CAL)  1,000 mL Per Tube Q24H  . feeding supplement (PROSource TF)  90 mL Per Tube QID  . heparin injection (subcutaneous)  5,000 Units Subcutaneous Q8H  . HYDROmorphone HCl  3 mg Oral Q4H  . linagliptin  5 mg Per Tube Daily  . mouth rinse  15 mL Mouth Rinse 10 times per day  . methylPREDNISolone (SOLU-MEDROL) injection  80 mg Intravenous Q12H  . metoCLOPramide (REGLAN) injection  10 mg Intravenous Q6H  . [START ON 11/13/2020] minocycline  100 mg Per Tube BID  . multivitamin  15 mL Per Tube Daily  . sodium chloride flush  10-40 mL Intracatheter Q12H  . thiamine  100 mg Per Tube Daily  . valproic acid  250 mg Per Tube QID    Social History   Tobacco Use  . Smoking status: Never Smoker  .  Smokeless tobacco: Never Used  Vaping Use  . Vaping Use: Never used  Substance Use Topics  . Alcohol use: Yes    Comment: occasional wine  . Drug use: Never    History reviewed. No pertinent family history.  Review of Systems -  Unable to obtain since intubated and sedated  OBJECTIVE: Temp:  [97.52 F (36.4 C)-100.94 F (38.3 C)] 100.22 F (37.9 C) (12/21 1700) Pulse Rate:  [105-128] 114 (12/21 1700) Resp:  [14-31] 17 (12/21 1700) BP: (92-119)/(54-74) 106/74 (12/21 1700) SpO2:  [93 %-100 %] 95 % (12/21 1700) FiO2 (%):  [45 %-50 %] 45 % (12/21 1700) Weight:  [122.4 kg] 122.4 kg (12/21 0500) Physical Exam  Constitutional: He intubated and sedated. He appearswell-nourished. No distress.  HENT: ETT in place dry oral mucosa, no scleral icterus Mouth/Throat: Oropharynx is clear and moist. No oropharyngeal exudate.  Cardiovascular: tachycardic, sinus Pulmonary/Chest: Effort normal and breath sounds are equal, on vent Abdominal: Soft. Bowel sounds are decreased Skin = echymosis/blisters to feet bilaterally Skin: Skin is warm and dry. No rash noted. No erythema.     LABS: Results for orders placed or performed during the hospital encounter of 10/20/20 (from the past 48 hour(s))  Renal function panel (daily at 1600)     Status: Abnormal   Collection Time: 11/10/20  6:43 PM  Result Value Ref Range   Sodium 133 (L) 135 - 145 mmol/L   Potassium 4.3 3.5 - 5.1 mmol/L   Chloride 101 98 - 111 mmol/L   CO2 23 22 - 32 mmol/L   Glucose, Bld 129 (H) 70 - 99 mg/dL    Comment: Glucose reference range applies only to samples taken after fasting for at least 8 hours.   BUN 48 (H) 6 - 20 mg/dL   Creatinine, Ser 1.73 (H) 0.61 - 1.24 mg/dL   Calcium 8.0 (L) 8.9 - 10.3 mg/dL   Phosphorus 4.2 2.5 - 4.6 mg/dL   Albumin 2.1 (L) 3.5 - 5.0 g/dL   GFR, Estimated 45 (L) >60 mL/min    Comment: (NOTE) Calculated using the CKD-EPI Creatinine Equation (2021)    Anion gap 9 5 - 15    Comment:  Performed at [facility], [place] [address], [place], [place] [address]  Glucose, capillary     Status: Abnormal   Collection Time: 11/10/20  7:40 PM  Result Value Ref Range   Glucose-Capillary 112 (H) 70 - 99 mg/dL    Comment: Glucose reference range applies only to samples taken after fasting for at least 8 hours.   Comment 1 Notify RN    Comment 2 Document in Chart   Glucose, capillary     Status: None   Collection Time: 11/10/20 11:54 PM  Result Value Ref Range   Glucose-Capillary 96 70 - 99 mg/dL    Comment: Glucose reference range applies only to samples taken after fasting for at least 8 hours.   Comment 1 Notify RN    Comment 2 Document in Chart   Renal function panel (daily at 0500)     Status: Abnormal   Collection Time: 11/11/20  3:12 AM  Result Value Ref Range   Sodium 134 (L) 135 - 145 mmol/L   Potassium 4.5 3.5 - 5.1 mmol/L   Chloride 100 98 - 111 mmol/L   CO2 25 22 - 32 mmol/L   Glucose, Bld 108 (H) 70 - 99 mg/dL    Comment: Glucose reference range applies only to samples taken after fasting for at least 8 hours.   BUN 43 (H) 6 - 20 mg/dL   Creatinine, Ser 1.89 (H) 0.61 - 1.24 mg/dL   Calcium 8.2 (L) 8.9 - 10.3 mg/dL   Phosphorus 4.3 2.5 - 4.6 mg/dL   Albumin 2.1 (L) 3.5 - 5.0 g/dL   GFR, Estimated 41 (L) >60 mL/min    Comment: (NOTE) Calculated using the CKD-EPI Creatinine Equation (2021)    Anion gap 9 5 - 15    Comment: Performed at [facility], [place] [address] [place], [place] [address]  Magnesium     Status: Abnormal   Collection Time: 11/11/20  3:12 AM  Result Value Ref Range   Magnesium 2.6 (H) 1.7 - 2.4 mg/dL    Comment: Performed at [facility], [place] [address], [place], [place] [address]  APTT     Status: Abnormal   Collection Time: 11/11/20  3:12 AM  Result Value Ref Range   aPTT 41 (H) 24 - 36 seconds    Comment:        IF BASELINE aPTT IS ELEVATED, SUGGEST PATIENT RISK ASSESSMENT BE USED  TO DETERMINE APPROPRIATE ANTICOAGULANT THERAPY. Performed at [facility], [place] [address] [place], [place] [address]   Hepatic function panel     Status: Abnormal   Collection  Time: 11/11/20  3:12 AM  Result Value Ref Range   Total Protein 7.4 6.5 - 8.1 g/dL   Albumin 2.1 (L) 3.5 - 5.0 g/dL   AST 17 15 - 41 U/L   ALT 21 0 - 44 U/L   Alkaline Phosphatase 74 38 - 126 U/L   Total Bilirubin 0.5 0.3 - 1.2 mg/dL   Bilirubin, Direct <0.1 0.0 - 0.2 mg/dL   Indirect Bilirubin NOT CALCULATED 0.3 - 0.9 mg/dL    Comment: Performed at [facility], [place] [address], [place], [place] [address]  CBC     Status: Abnormal   Collection Time: 11/11/20  3:12 AM  Result Value Ref Range   WBC 14.5 (H) 4.0 - 10.5 K/uL   RBC 2.80 (L) 4.22 - 5.81 MIL/uL   Hemoglobin 9.2 (L) 13.0 - 17.0 g/dL   HCT 28.4 (L) 39.0 - 52.0 %   MCV 101.4 (H) 80.0 - 100.0 fL   MCH 32.9 26.0 - 34.0 pg   MCHC 32.4 30.0 - 36.0 g/dL   RDW 14.4 11.5 - 15.5 %   Platelets 141 (L) 150 - 400 K/uL    Comment: REPEATED TO VERIFY Immature Platelet Fraction may be clinically indicated, consider ordering this additional test [id number] CONSISTENT WITH PREVIOUS RESULT    nRBC 0.0 0.0 - 0.2 %    Comment: Performed at [facility], [place] [address], [place], [place] [address]  Glucose, capillary     Status: None   Collection Time: 11/11/20  3:36 AM  Result Value Ref Range   Glucose-Capillary 97 70 - 99 mg/dL    Comment: Glucose reference range applies only to samples taken after fasting for at least 8 hours.   Comment 1 Notify RN    Comment 2 Document in Chart   Glucose, capillary     Status: None   Collection Time: 11/11/20 11:50 AM  Result Value Ref Range   Glucose-Capillary 96 70 - 99 mg/dL    Comment: Glucose reference range applies only to samples taken after fasting for at least 8 hours.  Glucose, capillary     Status: None   Collection Time: 11/11/20  4:13 PM  Result Value  Ref Range   Glucose-Capillary 93 70 - 99 mg/dL    Comment: Glucose reference range applies only to samples taken after fasting for at least 8 hours.   Comment 1 Notify RN    Comment 2 Document in Chart   Renal function panel (daily at 1600)     Status: Abnormal   Collection Time: 11/11/20  6:26 PM  Result Value Ref Range   Sodium 133 (L) 135 - 145 mmol/L   Potassium 4.3 3.5 - 5.1 mmol/L   Chloride 101 98 - 111 mmol/L   CO2 23 22 - 32 mmol/L   Glucose, Bld 124 (H) 70 - 99 mg/dL    Comment: Glucose reference range applies only to samples taken after fasting for at least 8 hours.   BUN 39 (H) 6 - 20 mg/dL   Creatinine, Ser 1.66 (H) 0.61 - 1.24 mg/dL   Calcium 7.9 (L) 8.9 - 10.3 mg/dL   Phosphorus 4.4 2.5 - 4.6 mg/dL   Albumin 2.0 (L) 3.5 - 5.0 g/dL   GFR, Estimated 48 (L) >60 mL/min    Comment: (NOTE) Calculated using the CKD-EPI Creatinine Equation (2021)    Anion gap 9 5 - 15    Comment: Performed at [facility], [place] [address]  [place], [place] [address]  Glucose, capillary     Status: Abnormal   Collection Time: 11/11/20  8:21 PM  Result Value Ref Range   Glucose-Capillary 111 (H) 70 - 99 mg/dL    Comment: Glucose reference range applies only to samples taken after fasting for at least 8 hours.  Glucose, capillary     Status: Abnormal   Collection Time: 11/11/20 10:49 PM  Result Value Ref Range   Glucose-Capillary 101 (H) 70 - 99 mg/dL    Comment: Glucose reference range applies only to samples taken after fasting for at least 8 hours.  Glucose, capillary     Status: Abnormal   Collection Time: 11/12/20  4:58 AM  Result Value Ref Range   Glucose-Capillary 100 (H) 70 - 99 mg/dL    Comment: Glucose reference range applies only to samples taken after fasting for at least 8 hours.  Renal function panel (daily at 0500)     Status: Abnormal   Collection Time: 11/12/20  7:39 AM  Result Value Ref Range   Sodium 134 (L) 135 - 145 mmol/L   Potassium 4.3 3.5 -  5.1 mmol/L   Chloride 100 98 - 111 mmol/L   CO2 24 22 - 32 mmol/L   Glucose, Bld 98 70 - 99 mg/dL    Comment: Glucose reference range applies only to samples taken after fasting for at least 8 hours.   BUN 36 (H) 6 - 20 mg/dL   Creatinine, Ser 1.78 (H) 0.61 - 1.24 mg/dL   Calcium 8.3 (L) 8.9 - 10.3 mg/dL   Phosphorus 4.0 2.5 - 4.6 mg/dL   Albumin 2.1 (L) 3.5 - 5.0 g/dL   GFR, Estimated 44 (L) >60 mL/min    Comment: (NOTE) Calculated using the CKD-EPI Creatinine Equation (2021)    Anion gap 10 5 - 15    Comment: Performed at [facility], [place] [address] [place], [place] [address]  Magnesium     Status: Abnormal   Collection Time: 11/12/20  7:39 AM  Result Value Ref Range   Magnesium 2.7 (H) 1.7 - 2.4 mg/dL    Comment: Performed at [facility], [place] [address], [place], [place] [address]  APTT     Status: Abnormal   Collection Time: 11/12/20  7:39 AM  Result Value Ref Range   aPTT 47 (H) 24 - 36 seconds    Comment:        IF BASELINE aPTT IS ELEVATED, SUGGEST PATIENT RISK ASSESSMENT BE USED TO DETERMINE APPROPRIATE ANTICOAGULANT THERAPY. Performed at [facility], [place] [address], [place], [place] [address]   CBC     Status: Abnormal   Collection Time: 11/12/20  7:39 AM  Result Value Ref Range   WBC 11.9 (H) 4.0 - 10.5 K/uL   RBC 2.66 (L) 4.22 - 5.81 MIL/uL   Hemoglobin 8.8 (L) 13.0 - 17.0 g/dL   HCT 28.3 (L) 39.0 - 52.0 %   MCV 106.4 (H) 80.0 - 100.0 fL   MCH 33.1 26.0 - 34.0 pg   MCHC 31.1 30.0 - 36.0 g/dL   RDW 14.6 11.5 - 15.5 %   Platelets 124 (L) 150 - 400 K/uL   nRBC 0.0 0.0 - 0.2 %    Comment: Performed at [facility], [place] [address], [place], [place] [address]  Sedimentation rate     Status: Abnormal   Collection Time: 11/12/20  7:39 AM  Result Value Ref Range   Sed Rate >140 (  H) 0 - 16 mm/hr    Comment: Performed at [facility], [place] [address], [place],  [place] [address]  Glucose, capillary     Status: Abnormal   Collection Time: 11/12/20  8:21 AM  Result Value Ref Range   Glucose-Capillary 100 (H) 70 - 99 mg/dL    Comment: Glucose reference range applies only to samples taken after fasting for at least 8 hours.  C-reactive protein     Status: Abnormal   Collection Time: 11/12/20 11:22 AM  Result Value Ref Range   CRP 25.1 (H) <1.0 mg/dL    Comment: Performed at [facility], [place] [address], [place], [place] [address]  Glucose, capillary     Status: Abnormal   Collection Time: 11/12/20 12:21 PM  Result Value Ref Range   Glucose-Capillary 110 (H) 70 - 99 mg/dL    Comment: Glucose reference range applies only to samples taken after fasting for at least 8 hours.   Comment 1 Notify RN    Comment 2 Document in Chart   Renal function panel (daily at 1600)     Status: Abnormal   Collection Time: 11/12/20  3:32 PM  Result Value Ref Range   Sodium 133 (L) 135 - 145 mmol/L   Potassium 5.6 (H) 3.5 - 5.1 mmol/L    Comment: NO VISIBLE HEMOLYSIS DELTA CHECK NOTED    Chloride 100 98 - 111 mmol/L   CO2 23 22 - 32 mmol/L   Glucose, Bld 149 (H) 70 - 99 mg/dL    Comment: Glucose reference range applies only to samples taken after fasting for at least 8 hours.   BUN 42 (H) 6 - 20 mg/dL   Creatinine, Ser 2.04 (H) 0.61 - 1.24 mg/dL   Calcium 8.3 (L) 8.9 - 10.3 mg/dL   Phosphorus 5.3 (H) 2.5 - 4.6 mg/dL   Albumin 2.0 (L) 3.5 - 5.0 g/dL   GFR, Estimated 37 (L) >60 mL/min    Comment: (NOTE) Calculated using the CKD-EPI Creatinine Equation (2021)    Anion gap 10 5 - 15    Comment: Performed at [facility], [place] [address] [place], [place] [address]  Glucose, capillary     Status: Abnormal   Collection Time: 11/12/20  4:29 PM  Result Value Ref Range   Glucose-Capillary 147 (H) 70 - 99 mg/dL    Comment: Glucose reference range applies only to samples taken after fasting for at least 8 hours.   Comment 1 Notify RN     Comment 2 Document in Chart     MICRO:  IMAGING: DG Chest Port 1 View  Addendum Date: 11/12/2020   ADDENDUM REPORT: 11/12/2020 03:42 ADDENDUM: A right upper extremity PICC tip extends towards midline but without visible extension inferiorly into the SVC. Possibly terminating within the brachiocephalic vein though difficult to fully assess given patient rotation. These results will be called to the ordering clinician or representative by the Radiologist Assistant, and communication documented in the PACS or [employer]. Electronically Signed   By: Lovena Le M.D.   On: 11/12/2020 03:42   Result Date: 11/12/2020 CLINICAL DATA:  PICC line placement, respiratory failure EXAM: PORTABLE CHEST 1 VIEW COMPARISON:  Radiograph 11/10/2020 FINDINGS: Patient is rotated in a steep right anterior obliquity superimposing portion of the mediastinum over the left lung and narrowing the right lung window. *A right IJ approach dual lumen dialysis catheter tip terminates in the expected location of the superior cavoatrial junction/right atrium. *Endotracheal tube  tip is low in the trachea, approximately 1 cm from the expected location of the carina. Consider retraction 3-4 cm to the mid trachea. *Paired transesophageal tubes terminate below the margins of imaging, beyond the GE junction. *Additional telemetry leads and support devices overlie the chest. Persistent extensive heterogeneous interstitial and consolidative opacities throughout both lungs. No pneumothorax. No effusion. Enlarged cardiac silhouette is likely stable from prior though difficult to fully assess given accentuation by rotation. No acute osseous or soft tissue abnormality. IMPRESSION: 1. Patient rotated in a steep right anterior obliquity superimposing portion of the mediastinum over the left lung and narrowing the right lung window. 2. Endotracheal tube tip is low in the trachea, approximately 1 cm from the expected location of the carina.  Consider retraction 3-4 cm to the mid trachea. 3. Right IJ catheter tip terminates at the superior cavoatrial junction/right atrium. 4. Persistent extensive heterogeneous interstitial and consolidative opacities throughout both lungs. Electronically Signed: By: Lovena Le M.D. On: 11/12/2020 02:25   DG Abd Portable 1V  Result Date: 11/11/2020 CLINICAL DATA:  Ileus EXAM: PORTABLE ABDOMEN - 1 VIEW COMPARISON:  11/06/2020 and prior. FINDINGS: Weighted enteric tube tip overlies the proximal duodenum. Non weighted enteric tube tip and side hole overlie the gastric body. Paucity of abdominal bowel gas. IMPRESSION: Non weighted enteric tube tip and side hole overlie the gastric body. Weighted enteric tube tip overlies the proximal duodenum. Electronically Signed   By: Primitivo Gauze M.D.   On: 11/11/2020 11:09    Assessment/Plan:   stenotrophomonas pneumonia = plan on treatment with minocycline. Plan of minimum of 7 days of treatment  Respiratory failure on vent/ARDS 2/2 covid PNA = continue with ARDS protocol with likely tracheostomy needs  AKI related Renal failure, non oliguric = remains on CRRT, wil adjust abtx if needed for renal failure.  Diarrhea = not thought to be due to cdifficile. Continue with rectal tube.  Mild protein calorie malnutrition = continues on tube feeds while intubated

## 2020-11-12 NOTE — Progress Notes (Signed)
PHARMACY - PHYSICIAN COMMUNICATION CRITICAL VALUE ALERT - BLOOD CULTURE IDENTIFICATION (BCID)  Jackson Figueroa is an 59 y.o. male who presented to [facility] on 10/20/2020 with a chief complaint of COVID PNA complicated by Stenotrophomonas PNA treated with Levaquin earlier during this admission  Assessment:  Micro lab called with sensitivities for Stenotrophomonas again growing in trach aspirate culture. 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  [name] D 11/12/2020  8:49 AM

## 2020-11-12 NOTE — Plan of Care (Signed)
Discussed in report in front of patient plan of care for the evening, pain management and temperature control with no evidence of learning due to patient being on a ventilator and sedated.  Problem: Health Behavior/Discharge Planning:  Goal: Ability to manage health-related needs will improve 11/12/2020 1952 by Jannette Fogo, RN Outcome: Not Progressing  Patient needs PICC Line exchanged ans supposed to happen 12/22 by IV Team. Patient ETT occasionally have a cuff leak and just placing air in at this time.  11/12/2020 1952 by Jannette Fogo, RN Outcome: Progressing  Patient hasn't needed boluses on first shift of sedation/pain medications with new IV line. Patient getting feeding tube restart as of 12/21 on first shift.  MD has at a trickle 10 ml/hr out of the 30 ml/hr goal per report.

## 2020-11-12 NOTE — Progress Notes (Signed)
Patient having cuff leaks with orginial ETT tube may want to exchange out since patient not trached.

## 2020-11-12 NOTE — Consult Note (Signed)
Reason for Consult: Respiratory failure Referring Physician: CCM  Jackson Figueroa is an 59 y.o. male.  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  Critical care team has requested tracheostomy.  Past Medical History:  Diagnosis Date  . Obesity     History reviewed. No pertinent surgical history.  History reviewed. No pertinent family history.  Social History:  reports that he has never smoked. He has never used smokeless tobacco. He reports current alcohol use. He reports that he does not use drugs.  Allergies: No Known Allergies  Medications: I have reviewed the patient's current medications.  Results for orders placed or performed during the hospital encounter of 10/20/20 (from the past 48 hour(s))  Renal function panel (daily at 1600)     Status: Abnormal   Collection Time: 11/10/20  6:43 PM  Result Value Ref Range   Sodium 133 (L) 135 - 145 mmol/L   Potassium 4.3 3.5 - 5.1 mmol/L   Chloride 101 98 - 111 mmol/L   CO2 23 22 - 32 mmol/L   Glucose, Bld 129 (H) 70 - 99 mg/dL    Comment: Glucose reference range applies only to samples taken after fasting for at least 8 hours.   BUN 48 (H) 6 - 20 mg/dL   Creatinine, Ser 1.73 (H) 0.61 - 1.24 mg/dL   Calcium 8.0 (L) 8.9 - 10.3 mg/dL   Phosphorus 4.2 2.5 - 4.6 mg/dL   Albumin 2.1 (L) 3.5 - 5.0 g/dL   GFR, Estimated 45 (L) >60 mL/min    Comment: (NOTE) Calculated using the CKD-EPI Creatinine Equation (2021)    Anion gap 9 5 - 15    Comment: Performed at [facility], [place] [address], [place], [place] [address]  Glucose, capillary     Status: Abnormal   Collection Time: 11/10/20  7:40 PM  Result Value Ref Range   Glucose-Capillary 112 (H) 70 - 99 mg/dL    Comment: Glucose reference range applies only to samples taken after fasting for at least 8 hours.   Comment 1 Notify RN    Comment 2 Document in Chart   Glucose, capillary     Status: None    Collection Time: 11/10/20 11:54 PM  Result Value Ref Range   Glucose-Capillary 96 70 - 99 mg/dL    Comment: Glucose reference range applies only to samples taken after fasting for at least 8 hours.   Comment 1 Notify RN    Comment 2 Document in Chart   Renal function panel (daily at 0500)     Status: Abnormal   Collection Time: 11/11/20  3:12 AM  Result Value Ref Range   Sodium 134 (L) 135 - 145 mmol/L   Potassium 4.5 3.5 - 5.1 mmol/L   Chloride 100 98 - 111 mmol/L   CO2 25 22 - 32 mmol/L   Glucose, Bld 108 (H) 70 - 99 mg/dL    Comment: Glucose reference range applies only to samples taken after fasting for at least 8 hours.   BUN 43 (H) 6 - 20 mg/dL   Creatinine, Ser 1.89 (H) 0.61 - 1.24 mg/dL   Calcium 8.2 (L) 8.9 - 10.3 mg/dL   Phosphorus 4.3 2.5 - 4.6 mg/dL   Albumin 2.1 (L) 3.5 - 5.0 g/dL   GFR, Estimated 41 (L) >60 mL/min    Comment: (NOTE) Calculated using the CKD-EPI Creatinine Equation (2021)    Anion gap 9 5 - 15  Comment: Performed at [facility], [place] [address] [place], [place] [address]  Magnesium     Status: Abnormal   Collection Time: 11/11/20  3:12 AM  Result Value Ref Range   Magnesium 2.6 (H) 1.7 - 2.4 mg/dL    Comment: Performed at [facility], [place] [address] [place], [place] [address]  APTT     Status: Abnormal   Collection Time: 11/11/20  3:12 AM  Result Value Ref Range   aPTT 41 (H) 24 - 36 seconds    Comment:        IF BASELINE aPTT IS ELEVATED, SUGGEST PATIENT RISK ASSESSMENT BE USED TO DETERMINE APPROPRIATE ANTICOAGULANT THERAPY. Performed at [facility], [place] [address], [place], [place] [address]   Hepatic function panel     Status: Abnormal   Collection Time: 11/11/20  3:12 AM  Result Value Ref Range   Total Protein 7.4 6.5 - 8.1 g/dL   Albumin 2.1 (L) 3.5 - 5.0 g/dL   AST 17 15 - 41 U/L   ALT 21 0 - 44 U/L   Alkaline Phosphatase 74 38 - 126 U/L   Total Bilirubin 0.5 0.3 - 1.2  mg/dL   Bilirubin, Direct <0.1 0.0 - 0.2 mg/dL   Indirect Bilirubin NOT CALCULATED 0.3 - 0.9 mg/dL    Comment: Performed at [facility], [place] [address], [place], [place] [address]  CBC     Status: Abnormal   Collection Time: 11/11/20  3:12 AM  Result Value Ref Range   WBC 14.5 (H) 4.0 - 10.5 K/uL   RBC 2.80 (L) 4.22 - 5.81 MIL/uL   Hemoglobin 9.2 (L) 13.0 - 17.0 g/dL   HCT 28.4 (L) 39.0 - 52.0 %   MCV 101.4 (H) 80.0 - 100.0 fL   MCH 32.9 26.0 - 34.0 pg   MCHC 32.4 30.0 - 36.0 g/dL   RDW 14.4 11.5 - 15.5 %   Platelets 141 (L) 150 - 400 K/uL    Comment: REPEATED TO VERIFY Immature Platelet Fraction may be clinically indicated, consider ordering this additional test [id number] CONSISTENT WITH PREVIOUS RESULT    nRBC 0.0 0.0 - 0.2 %    Comment: Performed at [facility], [place] [address], [place], [place] [address]  Glucose, capillary     Status: None   Collection Time: 11/11/20  3:36 AM  Result Value Ref Range   Glucose-Capillary 97 70 - 99 mg/dL    Comment: Glucose reference range applies only to samples taken after fasting for at least 8 hours.   Comment 1 Notify RN    Comment 2 Document in Chart   Glucose, capillary     Status: None   Collection Time: 11/11/20 11:50 AM  Result Value Ref Range   Glucose-Capillary 96 70 - 99 mg/dL    Comment: Glucose reference range applies only to samples taken after fasting for at least 8 hours.  Glucose, capillary     Status: None   Collection Time: 11/11/20  4:13 PM  Result Value Ref Range   Glucose-Capillary 93 70 - 99 mg/dL    Comment: Glucose reference range applies only to samples taken after fasting for at least 8 hours.   Comment 1 Notify RN    Comment 2 Document in Chart   Renal function panel (daily at 1600)     Status: Abnormal   Collection Time: 11/11/20  6:26 PM  Result Value Ref Range   Sodium 133 (L)  135 - 145 mmol/L   Potassium 4.3 3.5 - 5.1 mmol/L   Chloride 101 98 - 111 mmol/L    CO2 23 22 - 32 mmol/L   Glucose, Bld 124 (H) 70 - 99 mg/dL    Comment: Glucose reference range applies only to samples taken after fasting for at least 8 hours.   BUN 39 (H) 6 - 20 mg/dL   Creatinine, Ser 1.66 (H) 0.61 - 1.24 mg/dL   Calcium 7.9 (L) 8.9 - 10.3 mg/dL   Phosphorus 4.4 2.5 - 4.6 mg/dL   Albumin 2.0 (L) 3.5 - 5.0 g/dL   GFR, Estimated 48 (L) >60 mL/min    Comment: (NOTE) Calculated using the CKD-EPI Creatinine Equation (2021)    Anion gap 9 5 - 15    Comment: Performed at [facility], [place] [address] [place], [place] [address]  Glucose, capillary     Status: Abnormal   Collection Time: 11/11/20  8:21 PM  Result Value Ref Range   Glucose-Capillary 111 (H) 70 - 99 mg/dL    Comment: Glucose reference range applies only to samples taken after fasting for at least 8 hours.  Glucose, capillary     Status: Abnormal   Collection Time: 11/11/20 10:49 PM  Result Value Ref Range   Glucose-Capillary 101 (H) 70 - 99 mg/dL    Comment: Glucose reference range applies only to samples taken after fasting for at least 8 hours.  Glucose, capillary     Status: Abnormal   Collection Time: 11/12/20  4:58 AM  Result Value Ref Range   Glucose-Capillary 100 (H) 70 - 99 mg/dL    Comment: Glucose reference range applies only to samples taken after fasting for at least 8 hours.  Renal function panel (daily at 0500)     Status: Abnormal   Collection Time: 11/12/20  7:39 AM  Result Value Ref Range   Sodium 134 (L) 135 - 145 mmol/L   Potassium 4.3 3.5 - 5.1 mmol/L   Chloride 100 98 - 111 mmol/L   CO2 24 22 - 32 mmol/L   Glucose, Bld 98 70 - 99 mg/dL    Comment: Glucose reference range applies only to samples taken after fasting for at least 8 hours.   BUN 36 (H) 6 - 20 mg/dL   Creatinine, Ser 1.78 (H) 0.61 - 1.24 mg/dL   Calcium 8.3 (L) 8.9 - 10.3 mg/dL   Phosphorus 4.0 2.5 - 4.6 mg/dL   Albumin 2.1 (L) 3.5 - 5.0 g/dL   GFR, Estimated 44 (L) >60 mL/min    Comment:  (NOTE) Calculated using the CKD-EPI Creatinine Equation (2021)    Anion gap 10 5 - 15    Comment: Performed at [facility], [place] [address], [place], [place] [address]  Magnesium     Status: Abnormal   Collection Time: 11/12/20  7:39 AM  Result Value Ref Range   Magnesium 2.7 (H) 1.7 - 2.4 mg/dL    Comment: Performed at [facility], [place] [address], [place], [place] [address]  APTT     Status: Abnormal   Collection Time: 11/12/20  7:39 AM  Result Value Ref Range   aPTT 47 (H) 24 - 36 seconds    Comment:        IF BASELINE aPTT IS ELEVATED, SUGGEST PATIENT RISK ASSESSMENT BE USED TO DETERMINE APPROPRIATE ANTICOAGULANT THERAPY. Performed at [facility], [place] [address], [place], [place] [address]   CBC  Status: Abnormal   Collection Time: 11/12/20  7:39 AM  Result Value Ref Range   WBC 11.9 (H) 4.0 - 10.5 K/uL   RBC 2.66 (L) 4.22 - 5.81 MIL/uL   Hemoglobin 8.8 (L) 13.0 - 17.0 g/dL   HCT 28.3 (L) 39.0 - 52.0 %   MCV 106.4 (H) 80.0 - 100.0 fL   MCH 33.1 26.0 - 34.0 pg   MCHC 31.1 30.0 - 36.0 g/dL   RDW 14.6 11.5 - 15.5 %   Platelets 124 (L) 150 - 400 K/uL   nRBC 0.0 0.0 - 0.2 %    Comment: Performed at [facility], [place] [address] [place], [place] [address]  Sedimentation rate     Status: Abnormal   Collection Time: 11/12/20  7:39 AM  Result Value Ref Range   Sed Rate >140 (H) 0 - 16 mm/hr    Comment: Performed at [facility], [place] [address], [place], [place] [address]  Glucose, capillary     Status: Abnormal   Collection Time: 11/12/20  8:21 AM  Result Value Ref Range   Glucose-Capillary 100 (H) 70 - 99 mg/dL    Comment: Glucose reference range applies only to samples taken after fasting for at least 8 hours.  C-reactive protein     Status: Abnormal   Collection Time: 11/12/20 11:22 AM  Result Value Ref Range   CRP 25.1 (H) <1.0 mg/dL    Comment: Performed at [facility], [place] [address], [place], [place] [address]  Glucose, capillary     Status: Abnormal   Collection Time: 11/12/20 12:21 PM  Result Value Ref Range   Glucose-Capillary 110 (H) 70 - 99 mg/dL    Comment: Glucose reference range applies only to samples taken after fasting for at least 8 hours.   Comment 1 Notify RN    Comment 2 Document in Chart   Renal function panel (daily at 1600)     Status: Abnormal   Collection Time: 11/12/20  3:32 PM  Result Value Ref Range   Sodium 133 (L) 135 - 145 mmol/L   Potassium 5.6 (H) 3.5 - 5.1 mmol/L    Comment: NO VISIBLE HEMOLYSIS DELTA CHECK NOTED    Chloride 100 98 - 111 mmol/L   CO2 23 22 - 32 mmol/L   Glucose, Bld 149 (H) 70 - 99 mg/dL    Comment: Glucose reference range applies only to samples taken after fasting for at least 8 hours.   BUN 42 (H) 6 - 20 mg/dL   Creatinine, Ser 2.04 (H) 0.61 - 1.24 mg/dL   Calcium 8.3 (L) 8.9 - 10.3 mg/dL   Phosphorus 5.3 (H) 2.5 - 4.6 mg/dL   Albumin 2.0 (L) 3.5 - 5.0 g/dL   GFR, Estimated 37 (L) >60 mL/min    Comment: (NOTE) Calculated using the CKD-EPI Creatinine Equation (2021)    Anion gap 10 5 - 15    Comment: Performed at [facility], [place] [address], [place], [place] [address]  Glucose, capillary     Status: Abnormal   Collection Time: 11/12/20  4:29 PM  Result Value Ref Range   Glucose-Capillary 147 (H) 70 - 99 mg/dL    Comment: Glucose reference range applies only to samples taken after fasting for at least 8 hours.   Comment 1 Notify RN    Comment 2 Document in Chart     DG Chest Port 1 View  Addendum Date: 11/12/2020   ADDENDUM REPORT:  11/12/2020 03:42 ADDENDUM: A right upper extremity PICC tip extends towards midline but without visible extension inferiorly into the SVC. Possibly terminating within the brachiocephalic vein though difficult to fully assess given patient rotation. These results will be called to the ordering clinician or representative by  the Radiologist Assistant, and communication documented in the PACS or [employer]. Electronically Signed   By: Lovena Le M.D.   On: 11/12/2020 03:42   Result Date: 11/12/2020 CLINICAL DATA:  PICC line placement, respiratory failure EXAM: PORTABLE CHEST 1 VIEW COMPARISON:  Radiograph 11/10/2020 FINDINGS: Patient is rotated in a steep right anterior obliquity superimposing portion of the mediastinum over the left lung and narrowing the right lung window. *A right IJ approach dual lumen dialysis catheter tip terminates in the expected location of the superior cavoatrial junction/right atrium. *Endotracheal tube tip is low in the trachea, approximately 1 cm from the expected location of the carina. Consider retraction 3-4 cm to the mid trachea. *Paired transesophageal tubes terminate below the margins of imaging, beyond the GE junction. *Additional telemetry leads and support devices overlie the chest. Persistent extensive heterogeneous interstitial and consolidative opacities throughout both lungs. No pneumothorax. No effusion. Enlarged cardiac silhouette is likely stable from prior though difficult to fully assess given accentuation by rotation. No acute osseous or soft tissue abnormality. IMPRESSION: 1. Patient rotated in a steep right anterior obliquity superimposing portion of the mediastinum over the left lung and narrowing the right lung window. 2. Endotracheal tube tip is low in the trachea, approximately 1 cm from the expected location of the carina. Consider retraction 3-4 cm to the mid trachea. 3. Right IJ catheter tip terminates at the superior cavoatrial junction/right atrium. 4. Persistent extensive heterogeneous interstitial and consolidative opacities throughout both lungs. Electronically Signed: By: Lovena Le M.D. On: 11/12/2020 02:25   DG Abd Portable 1V  Result Date: 11/11/2020 CLINICAL DATA:  Ileus EXAM: PORTABLE ABDOMEN - 1 VIEW COMPARISON:  11/06/2020 and prior. FINDINGS:  Weighted enteric tube tip overlies the proximal duodenum. Non weighted enteric tube tip and side hole overlie the gastric body. Paucity of abdominal bowel gas. IMPRESSION: Non weighted enteric tube tip and side hole overlie the gastric body. Weighted enteric tube tip overlies the proximal duodenum. Electronically Signed   By: Primitivo Gauze M.D.   On: 11/11/2020 11:09    Review of Systems  Unable to perform ROS: Intubated   Blood pressure 106/74, pulse (!) 114, temperature 100.22 F (37.9 C), temperature source Core, resp. rate 17, height 6' (1.829 m), weight 122.4 kg, SpO2 95 %. Physical Exam Constitutional:      Appearance: He is normal weight.     Comments: Sedated, intubated  HENT:     Head: Normocephalic and atraumatic.     Right Ear: External ear normal.     Left Ear: External ear normal.     Nose: Nose normal.     Mouth/Throat:     Mouth: Mucous membranes are moist.     Pharynx: Oropharynx is clear.  Neck:     Comments: No previous trach scar. Cardiovascular:     Rate and Rhythm: Normal rate.  Pulmonary:     Effort: Pulmonary effort is normal.  Skin:    General: Skin is warm.  Neurological:     Comments: Sedated.  Psychiatric:     Comments: Sedated     Assessment/Plan: Respiratory failure  Will schedule tracheostomy on 12/23.  Discussed the recommendation with his sister, Jackson Figueroa, who gives consent.  Risks, benefits,  and alternatives were discussed.  Melida Quitter 11/12/2020, 5:58 PM

## 2020-11-12 NOTE — Progress Notes (Signed)
PICC Line was hard to flush earlier in the shift.  Caps changed and played flushed more with slight blood return.   Dressing was changed later and found disc was bloody and dried with line not the at the same point with previous dressing change.  IV Team consulted and heparin and tPA given.  CXR done and E-Link MD contacted.   PICC line will probably have to be exchanged nd repeat CXR in the morning.

## 2020-11-12 NOTE — Progress Notes (Addendum)
[facility] Progress Note    Assessment/ Plan:   1. AKI - acute kidney injury, nonoliguric w/ rising creat / BUN due to shock +/-  hemodynamics/ +/-COVID-19 effects. [place] w/o hydro, normal appearing kidneys. UA +hematuria/ pyuria. Creat 1.1 on admit, worsening up to 5.4 w/ BUN 178 so pt was started on CRRT 12/16.Marland Kitchen Heparin increased in filter, will increase to 1000 u /hr for additional clotting 2. BP/ volume - off pressors, BP's wnl. Pull 50 mL/ hr 3. COVID pna/ ARDS - per CCM, mult infiltrates on CXR. As above. Janina Mayo being planned 4. MDR Stenotrophomonas: ID back on board, getting 5. Diarrhea - rectal tube 6. Slow gastric transit: NGT clamped, now getting trickle feeds 7. Dispo: remains in ICU   Subjective:    Seen in room.  Kept even yesterday, heparin in filter increased.  Has MDR stenotrophomonas.  Janina Mayo being planned.   Objective:   BP 105/68 (BP Location: Left Arm)   Pulse (!) 126   Temp (!) 100.58 F (38.1 C) (Core)   Resp (!) 22   Ht 6' (1.829 m)   Wt 122.4 kg Comment: will have them retake later  SpO2 97%   BMI 36.60 kg/m   Intake/Output Summary (Last 24 hours) at 11/12/2020 1411 Last data filed at 11/12/2020 1400 Gross per 24 hour  Intake 1647.37 ml  Output 2013 ml  Net -365.63 ml   Weight change: 0 kg  Physical Exam: Gen: deeply sedated, NAD HEENT: ETT and NGT in place CVS: tachycardic Resp: bilateral coarse breath sounds Abd: soft, somewhat distended, maybe a little softer than yesterday Ext: 1+ anasarca MSK: L hand with cool/ mottled nail beds  Imaging: DG Chest Port 1 View  Addendum Date: 11/12/2020   ADDENDUM REPORT: 11/12/2020 03:42 ADDENDUM: A right upper extremity PICC tip extends towards midline but without visible extension inferiorly into the SVC. Possibly terminating within the brachiocephalic vein though difficult to fully assess given patient rotation. These results will be called to the ordering clinician or representative by  the Radiologist Assistant, and communication documented in the PACS or [employer]. Electronically Signed   By: Kreg Shropshire M.D.   On: 11/12/2020 03:42   Result Date: 11/12/2020 CLINICAL DATA:  PICC line placement, respiratory failure EXAM: PORTABLE CHEST 1 VIEW COMPARISON:  Radiograph 11/10/2020 FINDINGS: Patient is rotated in a steep right anterior obliquity superimposing portion of the mediastinum over the left lung and narrowing the right lung window. *A right IJ approach dual lumen dialysis catheter tip terminates in the expected location of the superior cavoatrial junction/right atrium. *Endotracheal tube tip is low in the trachea, approximately 1 cm from the expected location of the carina. Consider retraction 3-4 cm to the mid trachea. *Paired transesophageal tubes terminate below the margins of imaging, beyond the GE junction. *Additional telemetry leads and support devices overlie the chest. Persistent extensive heterogeneous interstitial and consolidative opacities throughout both lungs. No pneumothorax. No effusion. Enlarged cardiac silhouette is likely stable from prior though difficult to fully assess given accentuation by rotation. No acute osseous or soft tissue abnormality. IMPRESSION: 1. Patient rotated in a steep right anterior obliquity superimposing portion of the mediastinum over the left lung and narrowing the right lung window. 2. Endotracheal tube tip is low in the trachea, approximately 1 cm from the expected location of the carina. Consider retraction 3-4 cm to the mid trachea. 3. Right IJ catheter tip terminates at the superior cavoatrial junction/right atrium. 4. Persistent extensive heterogeneous interstitial and consolidative opacities  throughout both lungs. Electronically Signed: By: Lovena Le M.D. On: 11/12/2020 02:25   DG Abd Portable 1V  Result Date: 11/11/2020 CLINICAL DATA:  Ileus EXAM: PORTABLE ABDOMEN - 1 VIEW COMPARISON:  11/06/2020 and prior. FINDINGS:  Weighted enteric tube tip overlies the proximal duodenum. Non weighted enteric tube tip and side hole overlie the gastric body. Paucity of abdominal bowel gas. IMPRESSION: Non weighted enteric tube tip and side hole overlie the gastric body. Weighted enteric tube tip overlies the proximal duodenum. Electronically Signed   By: Primitivo Gauze M.D.   On: 11/11/2020 11:09    Labs: BMET Recent Labs  Lab 11/09/20 0322 11/09/20 1600 11/10/20 0335 11/10/20 1843 11/11/20 0312 11/11/20 1826 11/12/20 0739  NA 127* 131* 130* 133* 134* 133* 134*  K 3.4* 4.8 4.0 4.3 4.5 4.3 4.3  CL 95* 99 97* 101 100 101 100  CO2 $Re'22 22 24 23 25 23 24  'xwN$ GLUCOSE 338* 185* 236* 129* 108* 124* 98  BUN 72* 62* 51* 48* 43* 39* 36*  CREATININE 2.39* 2.13* 1.91* 1.73* 1.89* 1.66* 1.78*  CALCIUM 7.6* 7.9* 8.0* 8.0* 8.2* 7.9* 8.3*  PHOS 4.3 4.9* 4.3 4.2 4.3 4.4 4.0   CBC Recent Labs  Lab 11/06/20 0316 11/08/20 0513 11/09/20 0322 11/10/20 0335 11/11/20 0312 11/12/20 0739  WBC 9.2 9.6 9.3 11.5* 14.5* 11.9*  NEUTROABS 7.0 7.9*  --   --   --   --   HGB 9.4* 9.0* 8.5* 9.2* 9.2* 8.8*  HCT 28.7* 28.4* 25.6* 28.4* 28.4* 28.3*  MCV 102.9* 101.4* 99.6 101.1* 101.4* 106.4*  PLT 149* 128* 126* 130* 141* 124*    Medications:    . acetylcysteine  2 mL Nebulization QID  . albuterol  2.5 mg Nebulization QID  . artificial tears  1 application Both Eyes J0K  . chlorhexidine gluconate (MEDLINE KIT)  15 mL Mouth Rinse BID  . Chlorhexidine Gluconate Cloth  6 each Topical Q0600  . collagenase   Topical Daily  . famotidine  20 mg Per Tube Daily  . feeding supplement (PIVOT 1.5 CAL)  1,000 mL Per Tube Q24H  . feeding supplement (PROSource TF)  90 mL Per Tube QID  . heparin injection (subcutaneous)  5,000 Units Subcutaneous Q8H  . HYDROmorphone HCl  3 mg Oral Q4H  . linagliptin  5 mg Per Tube Daily  . mouth rinse  15 mL Mouth Rinse 10 times per day  . methylPREDNISolone (SOLU-MEDROL) injection  80 mg Intravenous Q12H  .  metoCLOPramide (REGLAN) injection  10 mg Intravenous Q6H  . multivitamin  15 mL Per Tube Daily  . sodium chloride flush  10-40 mL Intracatheter Q12H  . thiamine  100 mg Per Tube Daily  . valproic acid  250 mg Per Tube QID      Madelon Lips MD 11/12/2020, 2:11 PM

## 2020-11-12 NOTE — Consult Note (Addendum)
[facility] Nurse wound follow up Consult to reassess sacrum wound. Pt is in isolation for Covid. Refer to previous progress notes on 12/15.  Performed remotely after review of progress notes in the EMR and use of remote ICU camera and assistance with turning from the bedside nurse.  Wound type: Sacrum with previously noted deep tissue pressure injury; approx 4X2X.2cm, dark reddish purple.  Has evolved to full thickness tissue loss and Unstageable wound in the center; approx 1X1X.1cm Dressing procedure/placement/frequency: Pt is on a low airloss mattress to reduce pressure. Continue present plan of care as previously ordered for bedside nurses to assist with enzymatic debridement of nonviable tissue as follows: Apply Santyl to sacral wound daily then cover with moist gauze and foam dressing.  Change foam dressing Q 3 days or PRN soiling. [place] team will reassess the location weekly to determine if a change in the plan of care is indicated at that time.  Julien Girt MSN, RN, [place], [place], [place]

## 2020-11-12 NOTE — Progress Notes (Addendum)
[facility] Progress Note Patient Name: Jackson Figueroa DOB: August 10, 1961 MRN: [id number]   Date of Service  11/12/2020  HPI/Events of Note  Review of CXR reveals that R PICC line has been pulled back several cm and ithe tip is no longer in SVC. Patient only has Dilaudid and Versed infusing via R PICC and is well sedated. Nursing also reports desaturation with NMB administration earlier today while on PCV,   eICU Interventions  Plan: 1. PICC line will require replacement on days.  2. Please do not give NMB while on PCV.     Intervention Category Major Interventions: Other:  [name] 11/12/2020, 4:32 AM

## 2020-11-12 NOTE — Progress Notes (Addendum)
Ask to evaluate issues with PICC line not returning blood either port and difficulty flushing. Tpa'd with no results X 1. X-ray shows per Radiology tip of line brachiocephalic. RN notified M.D. for further instructions. Awaiting order for possible  PICC exchange.

## 2020-11-12 NOTE — Progress Notes (Signed)
Spoke to ENT Will see if can be arranged. If not then we will do next week at bedside.   Erick Colace ACNP-BC [place] Pager # [phone] OR # [phone] if no answer

## 2020-11-13 DIAGNOSIS — J158 Pneumonia due to other specified bacteria: Secondary | ICD-10-CM

## 2020-11-13 DIAGNOSIS — K567 Ileus, unspecified: Secondary | ICD-10-CM

## 2020-11-13 DIAGNOSIS — J9601 Acute respiratory failure with hypoxia: Secondary | ICD-10-CM

## 2020-11-13 LAB — CBC
HCT: 30.1 % — ABNORMAL LOW (ref 39.0–52.0)
Hemoglobin: 9.4 g/dL — ABNORMAL LOW (ref 13.0–17.0)
MCH: 33 pg (ref 26.0–34.0)
MCHC: 31.2 g/dL (ref 30.0–36.0)
MCV: 105.6 fL — ABNORMAL HIGH (ref 80.0–100.0)
Platelets: 135 10*3/uL — ABNORMAL LOW (ref 150–400)
RBC: 2.85 MIL/uL — ABNORMAL LOW (ref 4.22–5.81)
RDW: 14.3 % (ref 11.5–15.5)
WBC: 17.5 10*3/uL — ABNORMAL HIGH (ref 4.0–10.5)
nRBC: 0.1 % (ref 0.0–0.2)

## 2020-11-13 LAB — RENAL FUNCTION PANEL
Albumin: 2.1 g/dL — ABNORMAL LOW (ref 3.5–5.0)
Albumin: 2.1 g/dL — ABNORMAL LOW (ref 3.5–5.0)
Anion gap: 9 (ref 5–15)
Anion gap: 13 (ref 5–15)
BUN: 45 mg/dL — ABNORMAL HIGH (ref 6–20)
BUN: 49 mg/dL — ABNORMAL HIGH (ref 6–20)
CO2: 24 mmol/L (ref 22–32)
CO2: 22 mmol/L (ref 22–32)
Calcium: 8.1 mg/dL — ABNORMAL LOW (ref 8.9–10.3)
Calcium: 8.5 mg/dL — ABNORMAL LOW (ref 8.9–10.3)
Chloride: 100 mmol/L (ref 98–111)
Chloride: 100 mmol/L (ref 98–111)
Creatinine, Ser: 1.9 mg/dL — ABNORMAL HIGH (ref 0.61–1.24)
Creatinine, Ser: 1.79 mg/dL — ABNORMAL HIGH (ref 0.61–1.24)
GFR, Estimated: 41 mL/min — ABNORMAL LOW (ref >60)
GFR, Estimated: 44 mL/min — ABNORMAL LOW (ref >60)
Glucose, Bld: 134 mg/dL — ABNORMAL HIGH (ref 70–99)
Glucose, Bld: 156 mg/dL — ABNORMAL HIGH (ref 70–99)
Phosphorus: 3.6 mg/dL (ref 2.5–4.6)
Phosphorus: 3 mg/dL (ref 2.5–4.6)
Potassium: 5.2 mmol/L — ABNORMAL HIGH (ref 3.5–5.1)
Potassium: 3.9 mmol/L (ref 3.5–5.1)
Sodium: 133 mmol/L — ABNORMAL LOW (ref 135–145)
Sodium: 135 mmol/L (ref 135–145)

## 2020-11-13 LAB — GLUCOSE, CAPILLARY
Glucose-Capillary: 116 mg/dL — ABNORMAL HIGH (ref 70–99)
Glucose-Capillary: 118 mg/dL — ABNORMAL HIGH (ref 70–99)
Glucose-Capillary: 122 mg/dL — ABNORMAL HIGH (ref 70–99)
Glucose-Capillary: 131 mg/dL — ABNORMAL HIGH (ref 70–99)
Glucose-Capillary: 145 mg/dL — ABNORMAL HIGH (ref 70–99)
Glucose-Capillary: 147 mg/dL — ABNORMAL HIGH (ref 70–99)

## 2020-11-13 LAB — APTT: aPTT: 43 seconds — ABNORMAL HIGH (ref 24–36)

## 2020-11-13 LAB — MAGNESIUM: Magnesium: 2.5 mg/dL — ABNORMAL HIGH (ref 1.7–2.4)

## 2020-11-13 MED ORDER — CLONAZEPAM 1 MG PO TABS
2.0000 mg | ORAL_TABLET | Freq: Two times a day (BID) | ORAL | Status: DC
  Administered 2020-11-14: 04:00:00 2 mg via ORAL
  Filled 2020-11-13: qty 2

## 2020-11-13 MED ORDER — CLONAZEPAM 1 MG PO TABS
2.0000 mg | ORAL_TABLET | Freq: Two times a day (BID) | ORAL | Status: DC
  Administered 2020-11-13: 17:00:00 2 mg via ORAL
  Filled 2020-11-13 (×2): qty 2

## 2020-11-13 MED ORDER — METHYLPREDNISOLONE SODIUM SUCC 40 MG IJ SOLR
40.0000 mg | Freq: Two times a day (BID) | INTRAMUSCULAR | Status: DC
  Administered 2020-11-13 – 2020-11-14 (×2): 40 mg via INTRAVENOUS
  Filled 2020-11-13 (×2): qty 1

## 2020-11-13 NOTE — Progress Notes (Signed)
[facility] for Infectious Disease    Date of Admission:  10/20/2020   Total days of antibiotics 2/ninocycline           ID: Jackson Figueroa is a 59 y.o. male with   Active Problems:   Acute respiratory failure due to COVID-19 [facility])   Acute respiratory distress syndrome (ARDS) due to COVID-19 virus ([place])   Septic shock ([place])   AKI (acute kidney injury) ([place])   Protein calorie malnutrition ([place])   Acute metabolic encephalopathy   Ileus ([place])   Acute respiratory failure with hypoxemia (HCC)    Subjective: Fever curve slightly improved; remains on crrt. Plan for tracheostomy tomorrow Medications:  . albuterol  2.5 mg Nebulization QID  . chlorhexidine gluconate (MEDLINE KIT)  15 mL Mouth Rinse BID  . Chlorhexidine Gluconate Cloth  6 each Topical Q0600  . clonazePAM  2 mg Oral BID  . collagenase   Topical Daily  . famotidine  20 mg Per Tube Daily  . feeding supplement (PIVOT 1.5 CAL)  1,000 mL Per Tube Q24H  . feeding supplement (PROSource TF)  90 mL Per Tube QID  . heparin injection (subcutaneous)  5,000 Units Subcutaneous Q8H  . HYDROmorphone HCl  3 mg Oral Q4H  . linagliptin  5 mg Per Tube Daily  . mouth rinse  15 mL Mouth Rinse 10 times per day  . methylPREDNISolone (SOLU-MEDROL) injection  40 mg Intravenous Q12H  . metoCLOPramide (REGLAN) injection  10 mg Intravenous Q6H  . minocycline  100 mg Per Tube BID  . multivitamin  15 mL Per Tube Daily  . sodium chloride flush  10-40 mL Intracatheter Q12H  . thiamine  100 mg Per Tube Daily  . valproic acid  250 mg Per Tube QID    Objective: Vital signs in last 24 hours: Temp:  [90 F (32.2 C)-100.22 F (37.9 C)] 98.2 F (36.8 C) (12/22 1200) Pulse Rate:  [29-116] 101 (12/22 1430) Resp:  [11-24] 16 (12/22 1430) BP: (92-142)/(54-82) 113/63 (12/22 1430) SpO2:  [88 %-100 %] 94 % (12/22 1550) FiO2 (%):  [40 %-45 %] 40 % (12/22 1550) Weight:  [465 kg] 117 kg (12/22 0338) Physical Exam  Constitutional: He is intubated and  sedated. He appears well-developed and well-nourished. No distress.  HENT:  Mouth/Throat: OETT in place  Cardiovascular: Normal rate, regular rhythm and normal heart sounds. Exam reveals no gallop and no friction rub.  No murmur heard.  Pulmonary/Chest: Effort normal and breath sounds equal.  Mild rhonchi while intubated Abdominal: Soft. Bowel sounds are decreased. He exhibits no distension. Skin: Skin is warm and dry. No rash noted. No erythema.   Lab Results Recent Labs    11/12/20 0739 11/12/20 1532 11/12/20 2251 11/13/20 0322  WBC 11.9*  --   --  17.5*  HGB 8.8*  --   --  9.4*  HCT 28.3*  --   --  30.1*  NA 134*   < > 133* 133*  K 4.3   < > 5.8* 5.2*  CL 100   < > 100 100  CO2 24   < > 22 24  BUN 36*   < > 46* 45*  CREATININE 1.78*   < > 2.00* 1.90*   < > = values in this interval not displayed.   Liver Panel Recent Labs    11/11/20 0312 11/11/20 1826 11/12/20 2251 11/13/20 0322  PROT 7.4  --   --   --   ALBUMIN 2.1*  2.1*   < >  2.2* 2.1*  AST 17  --   --   --   ALT 21  --   --   --   ALKPHOS 74  --   --   --   BILITOT 0.5  --   --   --   BILIDIR <0.1  --   --   --   IBILI NOT CALCULATED  --   --   --    < > = values in this interval not displayed.   Sedimentation Rate Recent Labs    11/12/20 0739  ESRSEDRATE >140*   C-Reactive Protein Recent Labs    11/12/20 1122  CRP 25.1*    Microbiology: Steno+ Studies/Results: DG Chest Port 1 View  Addendum Date: 11/12/2020   ADDENDUM REPORT: 11/12/2020 03:42 ADDENDUM: A right upper extremity PICC tip extends towards midline but without visible extension inferiorly into the SVC. Possibly terminating within the brachiocephalic vein though difficult to fully assess given patient rotation. These results will be called to the ordering clinician or representative by the Radiologist Assistant, and communication documented in the PACS or [employer]. Electronically Signed   By: Lovena Le M.D.   On: 11/12/2020  03:42   Result Date: 11/12/2020 CLINICAL DATA:  PICC line placement, respiratory failure EXAM: PORTABLE CHEST 1 VIEW COMPARISON:  Radiograph 11/10/2020 FINDINGS: Patient is rotated in a steep right anterior obliquity superimposing portion of the mediastinum over the left lung and narrowing the right lung window. *A right IJ approach dual lumen dialysis catheter tip terminates in the expected location of the superior cavoatrial junction/right atrium. *Endotracheal tube tip is low in the trachea, approximately 1 cm from the expected location of the carina. Consider retraction 3-4 cm to the mid trachea. *Paired transesophageal tubes terminate below the margins of imaging, beyond the GE junction. *Additional telemetry leads and support devices overlie the chest. Persistent extensive heterogeneous interstitial and consolidative opacities throughout both lungs. No pneumothorax. No effusion. Enlarged cardiac silhouette is likely stable from prior though difficult to fully assess given accentuation by rotation. No acute osseous or soft tissue abnormality. IMPRESSION: 1. Patient rotated in a steep right anterior obliquity superimposing portion of the mediastinum over the left lung and narrowing the right lung window. 2. Endotracheal tube tip is low in the trachea, approximately 1 cm from the expected location of the carina. Consider retraction 3-4 cm to the mid trachea. 3. Right IJ catheter tip terminates at the superior cavoatrial junction/right atrium. 4. Persistent extensive heterogeneous interstitial and consolidative opacities throughout both lungs. Electronically Signed: By: Lovena Le M.D. On: 11/12/2020 02:25     Assessment/Plan: stenotrophomonas pneumonia = continue on minocycline  COVID related ARDS = continue with current vent support with plans to transition to tracheostomy so that can be able to decrease sedation  Aki= continue on crrt  [facility] for Infectious  Diseases Cell: [phone] Pager: [phone]  11/13/2020, 4:57 PM

## 2020-11-13 NOTE — Anesthesia Preprocedure Evaluation (Addendum)
Anesthesia Evaluation  Patient identified by MRN, date of birth, ID band Patient unresponsive    Reviewed: Allergy & Precautions, NPO status , Patient's Chart, lab work & pertinent test results, Unable to perform ROS - Chart review only  Airway Mallampati: Intubated       Dental   Unable to assess, pt intubated:   Pulmonary pneumonia (stenotrophomonas pneumonia on minocycline), unresolved,    + rhonchi  + decreased breath sounds  + intubated    Cardiovascular  Rhythm:Regular Rate:Tachycardia  ECHO 11/29  LVEF 50-55%, mild LVH, RV systolic function mildly reduced. Mild dilation of ascending aorta 24mm   Neuro/Psych    GI/Hepatic On tube feeds   Endo/Other    Renal/GU ARFRenal disease (on CRRT)     Musculoskeletal   Abdominal   Peds  Hematology  (+) Blood dyscrasia (Hgb 9.4, plt 135), anemia ,   Anesthesia Other Findings Acute respiratory failure due to COVID-19  positive COVID-19 test on 11/26 presented to [facility] with profound hypoxemia on 11/28.  Intubated immediately upon arrival found to be in ARDS.  Transferred to [facility] long hospital for ongoing ICU care 11/29   Reproductive/Obstetrics                           Anesthesia Physical Anesthesia Plan  ASA: IV  Anesthesia Plan: General   Post-op Pain Management:    Induction: Inhalational  PONV Risk Score and Plan: 2 and Midazolam and Treatment may vary due to age or medical condition  Airway Management Planned: Tracheostomy  Additional Equipment:   Intra-op Plan:   Post-operative Plan: Post-operative intubation/ventilation  Informed Consent: I have reviewed the patients History and Physical, chart, labs and discussed the procedure including the risks, benefits and alternatives for the proposed anesthesia with the patient or authorized representative who has indicated his/her understanding and acceptance.      Consent reviewed with POA and Dental advisory given  Plan Discussed with: CRNA  Anesthesia Plan Comments:         Anesthesia Quick Evaluation

## 2020-11-13 NOTE — Plan of Care (Signed)
Family was previously informed of trach placement tomorrow around 0715 AM,  Discussed care in front of patient and will try to titrate down on Versed with Klonopin added back with no evidence of learning on vent at this time.  Problem: Education: Goal: Knowledge of General Education information will improve Description: Including pain rating scale, medication(s)/side effects and non-pharmacologic comfort measures Outcome: Progressing   Problem: Health Behavior/Discharge Planning: Goal: Ability to manage health-related needs will improve Outcome: Progressing

## 2020-11-13 NOTE — Progress Notes (Signed)
NAME:  Jackson Figueroa, MRN:  [id number], DOB:  23-Dec-1960, LOS: 24 ADMISSION DATE:  10/20/2020, CONSULTATION DATE:  11/29 REFERRING MD:  Sabra Heck, CHIEF COMPLAINT:  ARDS COVID 106   Brief History   59 year old male with no significant past medical history and a positive COVID-19 test on 11/26 presented to [facility] with profound hypoxemia on 11/28.  Intubated immediately upon arrival found to be in ARDS. Transferred to [facility] long hospital for ongoing ICU care in the early a.m. hours of 11/29.  Past Medical History  Obesity  Significant Hospital Events   11/28 Presented to [facility] ER, intubated for hypoxemia, empiric abx, shock on levo. COVID Rx 11/29 Persistent shock, central line placed, ARDS protocol continued, proning initiated 11/30 Pressor requirements continued, NMB initiated to facilitate ventilation tube feeds started, initiated proning 12/01 Stopping NMB, proning positioning protocol discontinued, free water for rising creatinine and sodium 12/02 PEEP trending down.  Renal function improved. Changed to versed from prop gtt d/t rising triglycerides  12/03 Remdesivir completed. Peep/FIO2 stable. Free water adjusted. Solumedrol reduced, fentanyl to Dilaudid, adding Oxycodone  12/04 Dyssynchrony, given paralytic overnight, changed to ketamine; vomited / aspirated, restarted paralytics 12/06 Proned, under NMB 12/07 Proning, NMB continued. Switched antibiotics for stenotrophomonas 12/13 Stopped oxycodone, added dilaudid oral, continuous nimbex infusion to prn, palliative care consulted 12/14 Changed to pressure control 12/15 Worsening vent synchrony after clamping OG tube 12/18 added reglan IV for ? Ileus developing  12/18 back on paralytics for "guppy breathing/ air stacking" on PCV ? ,mucus plug  12/19  Back on PCV and off paralytics  12/20 having intermittent fevers, atrial fibrillation with some ST changes.  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 Started on minocycline per ID 12/22 stable  Consults:  Nephrology 12/14 Palliative 12/13 ID signed off 12/17  Procedures:  ETT 11/28 >> CVL 11/29 >> 12/9 R PICC 12/9 >>  R IJ HD Cath 12/17 >>  Significant Diagnostic Tests:  ECHO 11/29 >> LVEF 50-55%, mild LVH, RV systolic function mildly reduced. Mild dilation of ascending aorta 2mm. LE Venous Duplex 12/8 >> negative for DVT   Micro Data:  COVID 11/28 >> Positive  Influenza A/B 11/28 >> negative  UC 11/28 >> negative  BCx2 11/28 >> negative  Tracheal aspirate 12/5 >> Stenotrophomonas maltophilia >> S-bactrim, levofloxacin UC 12/11 >> negative  Tracheal aspirate 12/11 >> Stenotrophomonas maltophilia >> S-bactrim, levofloxacin  BCx2 12/11 >> negative  Trach 12/ 19 >>> multidrug resistance stenotrophomonas  Antimicrobials/covid rx:  CTX 11/28 > 11/29 Azithromycin 11/28 > 11/29 Remdesivir 11/28 > 12/2 Prednisone 11/28 >  Baricitinib 12/4 > 12/5 Vanco 12/5 >> 12/7, 12/11 x1 Cefepime 12/5 >> 12/7, 12/11 >> 12/13 Levaquin 12/7 >> 12/16 Minocycline 12/21>>>  .meds Interim history/subjective:  No sig change  Objective   Blood pressure (Abnormal) 99/59, pulse 94, temperature 97.88 F (36.6 C), resp. rate 12, height 6' (1.829 m), weight 117 kg, SpO2 95 %.    Vent Mode: CPAP;PSV FiO2 (%):  [40 %-45 %] 40 % Set Rate:  [15 bmp] 15 bmp PEEP:  [5 cmH20] 5 cmH20 Pressure Support:  [8 cmH20-10 cmH20] 8 cmH20 Plateau Pressure:  [19 cmH20-26 cmH20] 26 cmH20   Intake/Output Summary (Last 24 hours) at 11/13/2020 1018 Last data filed at 11/13/2020 1005 Gross per 24 hour  Intake 1425.98 ml  Output 1676 ml  Net -250.02  ml   Filed Weights   11/11/20 0420 11/12/20 0500 11/13/20 0338  Weight: 122.4 kg 122.4 kg 117 kg     Examination:  General this is a 59 year old vent dependent male HENT orally intubated. No JVD Pulm coarse scattered rhonchi. 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  Cont depakote.  Will assess for possibly starting methadone after Trach QTC will be limiting factor here  Vomiting, slow GI transit rule out ileus -TF restarted 12/17, no further vomiting -Continues to have high gastric output Plan Cont Reglan Adv tubefeed slowly Keep OG to gravity  Non-oliguric AKI   -Has been on CRRT, metabolic status and renal function stable Plan Volume removal as tolerated  Am chemistry Hope we can advance to iHD soon   Fluid and electrolyte imbalance hyponatremia 12/18 > worse 12/19; now better Plan Trending daily and correct w/ CRRT  Diarrhea Plan Monitor  Stop stool softners  Anemia of acute dz Hemoglobin is been stable Plan Trending CBC Transfuse for hgb < 7  Best practice (evaluated daily)  Diet: tube feeding; placed on hold 12/20, resuming postpyloric feedings 12/21 Pain/Anxiety/Delirium protocol (if indicated): as above, continues to  require high dose sedation VAP protocol (if indicated): yes DVT prophylaxis: heparin sq  GI prophylaxis: famotidine Glucose control: off Mobility: bed rest Last date of multidisciplinary goals of care discussion 12/21 with sister.   Family and staff present n/a Summary of discussion n/a Follow up goals of care discussion due: 12/29; family still wants full scope of care  Code Status: full code  Disposition: ICU  My critical care x32 min  Erick Colace ACNP-BC [place] Pager # [phone] OR # [phone] if no answer

## 2020-11-14 ENCOUNTER — Encounter (HOSPITAL_COMMUNITY)
Admission: EM | Disposition: A | Discharge status: Rehabilitation Facility | Payer: Self-pay | Source: Home / Self Care | Attending: Pulmonary Disease

## 2020-11-14 ENCOUNTER — Inpatient Hospital Stay (HOSPITAL_COMMUNITY): Payer: BC Managed Care – PPO

## 2020-11-14 ENCOUNTER — Inpatient Hospital Stay (HOSPITAL_COMMUNITY): Payer: BC Managed Care – PPO | Admitting: Certified Registered Nurse Anesthetist

## 2020-11-14 DIAGNOSIS — Z1624 Resistance to multiple antibiotics: Secondary | ICD-10-CM

## 2020-11-14 DIAGNOSIS — K567 Ileus, unspecified: Secondary | ICD-10-CM

## 2020-11-14 DIAGNOSIS — Z8619 Personal history of other infectious and parasitic diseases: Secondary | ICD-10-CM

## 2020-11-14 DIAGNOSIS — E441 Mild protein-calorie malnutrition: Secondary | ICD-10-CM

## 2020-11-14 HISTORY — PX: TRACHEOSTOMY TUBE PLACEMENT: SHX814

## 2020-11-14 LAB — GLUCOSE, CAPILLARY
Glucose-Capillary: 100 mg/dL — ABNORMAL HIGH (ref 70–99)
Glucose-Capillary: 102 mg/dL — ABNORMAL HIGH (ref 70–99)
Glucose-Capillary: 103 mg/dL — ABNORMAL HIGH (ref 70–99)
Glucose-Capillary: 106 mg/dL — ABNORMAL HIGH (ref 70–99)
Glucose-Capillary: 109 mg/dL — ABNORMAL HIGH (ref 70–99)
Glucose-Capillary: 110 mg/dL — ABNORMAL HIGH (ref 70–99)
Glucose-Capillary: 115 mg/dL — ABNORMAL HIGH (ref 70–99)
Glucose-Capillary: 116 mg/dL — ABNORMAL HIGH (ref 70–99)
Glucose-Capillary: 116 mg/dL — ABNORMAL HIGH (ref 70–99)
Glucose-Capillary: 119 mg/dL — ABNORMAL HIGH (ref 70–99)
Glucose-Capillary: 123 mg/dL — ABNORMAL HIGH (ref 70–99)
Glucose-Capillary: 127 mg/dL — ABNORMAL HIGH (ref 70–99)
Glucose-Capillary: 132 mg/dL — ABNORMAL HIGH (ref 70–99)
Glucose-Capillary: 98 mg/dL (ref 70–99)

## 2020-11-14 LAB — SURGICAL PCR SCREEN
MRSA, PCR: NEGATIVE
Staphylococcus aureus: NEGATIVE

## 2020-11-14 LAB — RENAL FUNCTION PANEL
Albumin: 2.1 g/dL — ABNORMAL LOW (ref 3.5–5.0)
Albumin: 2.3 g/dL — ABNORMAL LOW (ref 3.5–5.0)
Anion gap: 11 (ref 5–15)
Anion gap: 12 (ref 5–15)
BUN: 62 mg/dL — ABNORMAL HIGH (ref 6–20)
BUN: 69 mg/dL — ABNORMAL HIGH (ref 6–20)
CO2: 25 mmol/L (ref 22–32)
CO2: 23 mmol/L (ref 22–32)
Calcium: 8.8 mg/dL — ABNORMAL LOW (ref 8.9–10.3)
Calcium: 8.6 mg/dL — ABNORMAL LOW (ref 8.9–10.3)
Chloride: 99 mmol/L (ref 98–111)
Chloride: 100 mmol/L (ref 98–111)
Creatinine, Ser: 2.13 mg/dL — ABNORMAL HIGH (ref 0.61–1.24)
Creatinine, Ser: 2.18 mg/dL — ABNORMAL HIGH (ref 0.61–1.24)
GFR, Estimated: 35 mL/min — ABNORMAL LOW (ref >60)
GFR, Estimated: 34 mL/min — ABNORMAL LOW (ref >60)
Glucose, Bld: 137 mg/dL — ABNORMAL HIGH (ref 70–99)
Glucose, Bld: 126 mg/dL — ABNORMAL HIGH (ref 70–99)
Phosphorus: 4.1 mg/dL (ref 2.5–4.6)
Phosphorus: 4.9 mg/dL — ABNORMAL HIGH (ref 2.5–4.6)
Potassium: 4.1 mmol/L (ref 3.5–5.1)
Potassium: 4.7 mmol/L (ref 3.5–5.1)
Sodium: 135 mmol/L (ref 135–145)
Sodium: 135 mmol/L (ref 135–145)

## 2020-11-14 LAB — MAGNESIUM: Magnesium: 2.9 mg/dL — ABNORMAL HIGH (ref 1.7–2.4)

## 2020-11-14 LAB — APTT: aPTT: 33 seconds (ref 24–36)

## 2020-11-14 SURGERY — CREATION, TRACHEOSTOMY
Anesthesia: General | Site: Neck

## 2020-11-14 MED ORDER — MIDODRINE HCL 5 MG PO TABS
10.0000 mg | ORAL_TABLET | Freq: Three times a day (TID) | ORAL | Status: DC
  Administered 2020-11-14: 13:00:00 10 mg
  Filled 2020-11-14: qty 2

## 2020-11-14 MED ORDER — LACTATED RINGERS IV SOLN: INTRAVENOUS | Status: DC | PRN

## 2020-11-14 MED ORDER — 0.9 % SODIUM CHLORIDE (POUR BTL) OPTIME
TOPICAL | Status: DC | PRN
  Administered 2020-11-14: 08:00:00 1000 mL

## 2020-11-14 MED ORDER — VALPROIC ACID 250 MG/5ML PO SOLN
500.0000 mg | Freq: Four times a day (QID) | ORAL | Status: DC
  Administered 2020-11-14: 12:00:00 500 mg
  Filled 2020-11-14 (×2): qty 10

## 2020-11-14 MED ORDER — LIDOCAINE-EPINEPHRINE (PF) 1 %-1:200000 IJ SOLN
INTRAMUSCULAR | Status: AC
  Filled 2020-11-14: qty 30

## 2020-11-14 MED ORDER — MIDAZOLAM HCL 5 MG/5ML IJ SOLN
INTRAMUSCULAR | Status: DC | PRN
  Administered 2020-11-14: 6 mg via INTRAVENOUS

## 2020-11-14 MED ORDER — FAMOTIDINE IN NACL 20-0.9 MG/50ML-% IV SOLN
20.0000 mg | INTRAVENOUS | Status: DC
  Administered 2020-11-14 – 2020-11-18 (×5): 20 mg via INTRAVENOUS
  Filled 2020-11-14 (×5): qty 50

## 2020-11-14 MED ORDER — FENTANYL 100 MCG/HR TD PT72
1.0000 | MEDICATED_PATCH | TRANSDERMAL | Status: DC
  Administered 2020-11-14 – 2020-11-17 (×2): 1 via TRANSDERMAL
  Filled 2020-11-14 (×2): qty 1

## 2020-11-14 MED ORDER — CLONIDINE HCL 0.3 MG/24HR TD PTWK
0.3000 mg | MEDICATED_PATCH | TRANSDERMAL | Status: DC
  Administered 2020-11-14: 17:00:00 0.3 mg via TRANSDERMAL
  Filled 2020-11-14: qty 1

## 2020-11-14 MED ORDER — PROPOFOL 10 MG/ML IV BOLUS
INTRAVENOUS | Status: AC
  Filled 2020-11-14: qty 20

## 2020-11-14 MED ORDER — LIDOCAINE-EPINEPHRINE 1 %-1:100000 IJ SOLN
INTRAMUSCULAR | Status: DC | PRN
  Administered 2020-11-14: 3 mL

## 2020-11-14 MED ORDER — ORAL CARE MOUTH RINSE
15.0000 mL | OROMUCOSAL | Status: DC
  Administered 2020-11-14 – 2020-12-12 (×255): 15 mL via OROMUCOSAL

## 2020-11-14 MED ORDER — EPHEDRINE 5 MG/ML INJ
INTRAVENOUS | Status: AC
  Filled 2020-11-14: qty 10

## 2020-11-14 MED ORDER — METHYLPREDNISOLONE SODIUM SUCC 40 MG IJ SOLR
40.0000 mg | Freq: Two times a day (BID) | INTRAMUSCULAR | Status: DC
  Administered 2020-11-14 – 2020-11-19 (×10): 40 mg via INTRAVENOUS
  Filled 2020-11-14 (×10): qty 1

## 2020-11-14 MED ORDER — SODIUM CHLORIDE 0.9 % IV SOLN
100.0000 mg | Freq: Two times a day (BID) | INTRAVENOUS | Status: DC
  Administered 2020-11-14: 17:00:00 100 mg via INTRAVENOUS
  Filled 2020-11-14 (×2): qty 100

## 2020-11-14 MED ORDER — PHENYLEPHRINE 40 MCG/ML (10ML) SYRINGE FOR IV PUSH (FOR BLOOD PRESSURE SUPPORT)
PREFILLED_SYRINGE | INTRAVENOUS | Status: AC
  Filled 2020-11-14: qty 10

## 2020-11-14 MED ORDER — CLONIDINE HCL 0.1 MG PO TABS
0.2000 mg | ORAL_TABLET | Freq: Three times a day (TID) | ORAL | Status: DC
  Administered 2020-11-14: 12:00:00 0.2 mg
  Filled 2020-11-14: qty 2

## 2020-11-14 MED ORDER — PREDNISONE 5 MG/ML PO CONC
40.0000 mg | Freq: Every day | ORAL | Status: DC
  Administered 2020-11-14: 12:00:00 40 mg
  Filled 2020-11-14: qty 8

## 2020-11-14 MED ORDER — ROCURONIUM BROMIDE 10 MG/ML (PF) SYRINGE
PREFILLED_SYRINGE | INTRAVENOUS | Status: AC
  Filled 2020-11-14: qty 10

## 2020-11-14 MED ORDER — ROCURONIUM BROMIDE 100 MG/10ML IV SOLN
INTRAVENOUS | Status: DC | PRN
  Administered 2020-11-14: 100 mg via INTRAVENOUS

## 2020-11-14 MED ORDER — CHLORHEXIDINE GLUCONATE 0.12% ORAL RINSE (MEDLINE KIT)
15.0000 mL | Freq: Two times a day (BID) | OROMUCOSAL | Status: DC
  Administered 2020-11-14 – 2020-12-18 (×67): 15 mL via OROMUCOSAL

## 2020-11-14 SURGICAL SUPPLY — 30 items
ATTRACTOMAT 16X20 MAGNETIC DRP (DRAPES) ×2 IMPLANT
BLADE SURG 15 STRL LF DISP TIS (BLADE) ×1 IMPLANT
BLADE SURG 15 STRL SS (BLADE) ×2
BLADE SURG SZ11 CARB STEEL (BLADE) ×2 IMPLANT
CLEANER TIP ELECTROSURG 2X2 (MISCELLANEOUS) ×2 IMPLANT
COVER SURGICAL LIGHT HANDLE (MISCELLANEOUS) ×2 IMPLANT
COVER WAND RF STERILE (DRAPES) IMPLANT
DISSECTOR ROUND CHERRY 3/8 STR (MISCELLANEOUS) IMPLANT
ELECT COATED BLADE 2.86 ST (ELECTRODE) ×2 IMPLANT
ELECT REM PT RETURN 15FT ADLT (MISCELLANEOUS) ×2 IMPLANT
GAUZE 4X4 16PLY RFD (DISPOSABLE) ×2 IMPLANT
GLOVE BIO SURGEON STRL SZ7.5 (GLOVE) ×2 IMPLANT
GOWN STRL REUS W/TWL LRG LVL3 (GOWN DISPOSABLE) ×4 IMPLANT
KIT BASIN OR (CUSTOM PROCEDURE TRAY) ×2 IMPLANT
KIT TURNOVER KIT A (KITS) IMPLANT
PACK EENT SPLIT (PACKS) ×2 IMPLANT
PENCIL SMOKE EVACUATOR (MISCELLANEOUS) IMPLANT
SPONGE DRAIN TRACH 4X4 STRL 2S (GAUZE/BANDAGES/DRESSINGS) ×2 IMPLANT
SUT SILK 2 0 (SUTURE) ×2
SUT SILK 2 0 30  PSL (SUTURE) ×1
SUT SILK 2 0 30 PSL (SUTURE) ×1 IMPLANT
SUT SILK 2 0 REEL (SUTURE) IMPLANT
SUT SILK 2 0 SH (SUTURE) ×2 IMPLANT
SUT SILK 2 0SH CR/8 30 (SUTURE) ×2 IMPLANT
SUT SILK 2-0 18XBRD TIE 12 (SUTURE) ×1 IMPLANT
SYR 10ML LL (SYRINGE) ×2 IMPLANT
SYR 20ML LL LF (SYRINGE) ×4 IMPLANT
TOWEL OR 17X26 10 PK STRL BLUE (TOWEL DISPOSABLE) ×2 IMPLANT
TUBE TRACH FLEX 6.0 UNCF (MISCELLANEOUS) ×2 IMPLANT
TUBE TRACH SHILEY 8 DIST CUF (TUBING) ×2 IMPLANT

## 2020-11-14 NOTE — Progress Notes (Addendum)
Right nare Panda tube clogged and will not infuse tube feed or flush. New Panda placed in left nare by charge RN Sarah. KUB ordered to verify placement. Pt tolerated well.

## 2020-11-14 NOTE — Progress Notes (Signed)
New Cortrack has become clogged, unable to replace with panda or NG. Dr Melvyn Novas notified, orders to be placed for IR to place new cortrack per Dr Melvyn Novas

## 2020-11-14 NOTE — Brief Op Note (Signed)
10/20/2020 - 11/14/2020  8:21 AM  PATIENT:  Terressa Koyanagi  59 y.o. male  PRE-OPERATIVE DIAGNOSIS:  respiratory failure  POST-OPERATIVE DIAGNOSIS:  respiratory failure  PROCEDURE:  Procedure(s): TRACHEOSTOMY (N/A)  SURGEON:  Surgeon(s) and Role:    * Melida Quitter, MD - Primary  PHYSICIAN ASSISTANT:   ASSISTANTS: none   ANESTHESIA:   general  EBL:  Minimal   BLOOD ADMINISTERED:none  DRAINS: none   LOCAL MEDICATIONS USED:  LIDOCAINE   SPECIMEN:  No Specimen  DISPOSITION OF SPECIMEN:  N/A  COUNTS:  YES  TOURNIQUET:  * No tourniquets in log *  DICTATION: .Note written in EPIC  PLAN OF CARE: Return to ICU  PATIENT DISPOSITION:  ICU - intubated and hemodynamically stable.   Delay start of Pharmacological VTE agent (>24hrs) due to surgical blood loss or risk of bleeding: no

## 2020-11-14 NOTE — Op Note (Signed)
Preop diagnosis: Respiratory failure Postop diagnosis: same Procedure: Tracheostomy Surgeon: Redmond Baseman Assist: None Anesth: General and local with 1% lidocaine with 1:100,000 epinephrine Compl: None Findings: Normal neck and tracheal anatomy Description:  After discussing risks, benefits, and alternatives, the patient was brought to the operative suite and placed on the operative table in the supine position.  Anesthesia was induced with continued endotracheal anesthesia.  The anterior neck was prepped and draped in sterile fashion.  The trach site was injected with local anesthetic.  The incision was made using a 15 blade scalpel.  Subcutaneous fat was removed.  The strap muscles were retracted laterally exposing the thyroid isthmus.  The small isthmus was divided using electocautery.  Retraction of the isthmus to either side allowed exposure of the anterior tracheal wall.  Soft tissues were swept from the trachea.  The space between the second and third tracheal rings was injected with local anesthetic.  A horizontal incision was made in this space with the scalpel and extended to either side with scissors.  A 2-0 silk stay suture was placed around the ring above and below the trach site.  A #8 cuffed trach was placed and the inner cannula was inserted.  The anesthesia circuit was attached and the patient was successfully ventilated.  The stay sutures were tied with two knots above and one knot below the trach site.  The trach flange was secured to the neck skin using 2-0 silk in four quadrants.  The patient was cleaned off and a trach dressing and trach tie were added.  The patient was then returned to anesthesia and moved to the intensive care unit in stable condition.

## 2020-11-14 NOTE — Progress Notes (Signed)
[place] for Infectious Disease    Date of Admission:  10/20/2020   Total days of antibiotics 3 minocycline         ID: Jackson Figueroa is a 59 y.o. 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 Remains afebrile. O2- sat 40%/ PS 8/PEEP 5.   Medications:  . albuterol  2.5 mg Nebulization QID  . chlorhexidine gluconate (MEDLINE KIT)  15 mL Mouth Rinse BID  . Chlorhexidine Gluconate Cloth  6 each Topical Q0600  . clonazePAM  2 mg Oral BID  . cloNIDine  0.2 mg Per Tube TID  . collagenase   Topical Daily  . famotidine  20 mg Per Tube Daily  . feeding supplement (PIVOT 1.5 CAL)  1,000 mL Per Tube Q24H  . feeding supplement (PROSource TF)  90 mL Per Tube QID  . heparin injection (subcutaneous)  5,000 Units Subcutaneous Q8H  . HYDROmorphone HCl  3 mg Oral Q4H  . linagliptin  5 mg Per Tube Daily  . mouth rinse  15 mL Mouth Rinse 10 times per day  . metoCLOPramide (REGLAN) injection  10 mg Intravenous Q6H  . midodrine  10 mg Per Tube Q8H  . minocycline  100 mg Per Tube BID  . multivitamin  15 mL Per Tube Daily  . predniSONE  40 mg Per Tube Q breakfast  . sodium chloride flush  10-40 mL Intracatheter Q12H  . thiamine  100 mg Per Tube Daily  . valproic acid  500 mg Per Tube Q6H    Objective: Vital signs in last 24 hours: Temp:  [97.6 F (36.4 C)-98.7 F (37.1 C)] 97.7 F (36.5 C) (12/23 1200) Pulse Rate:  [85-136] 105 (12/23 1400) Resp:  [11-32] 15 (12/23 1400) BP: (108-154)/(53-96) 154/96 (12/23 1400) SpO2:  [87 %-100 %] 96 % (12/23  1519) FiO2 (%):  [40 %-60 %] 50 % (12/23 1519) Weight:  [116.3 kg] 116.3 kg (12/23 0239) Physical Exam  Constitutional: He is sedated. He appears well-developed and well-nourished. No distress.  HENT:  Mouth/Throat: Oropharynx is dry Trach: ostomy in place with clear secretions Cardiovascular: Normal rate, regular rhythm and normal heart sounds. Exam reveals no gallop and no friction rub.  No murmur heard.  Pulmonary/Chest: Effort normal and breath sounds equal- mild rhonchi Abdominal: Soft. Bowel sounds are decreased He exhibits no distension. There is no tenderness.  Skin: Skin is warm and dry. No rash noted. No erythema.    Lab Results Recent Labs    11/12/20 0739 11/12/20 1532 11/13/20 0322 11/13/20 1750 11/14/20 0436  WBC 11.9*  --  17.5*  --   --   HGB 8.8*  --  9.4*  --   --   HCT 28.3*  --  30.1*  --   --   NA 134*   < > 133* 135 135  K 4.3   < > 5.2* 3.9 4.1  CL 100   < > 100 100 99  CO2 24   < > $R'24 22 25  'aj$ BUN 36*   < > 45* 49*  62*  CREATININE 1.78*   < > 1.90* 1.79* 2.13*   < > = values in this interval not displayed.   Liver Panel Recent Labs    11/13/20 1750 11/14/20 0436  ALBUMIN 2.1* 2.1*   Sedimentation Rate Recent Labs    11/12/20 0739  ESRSEDRATE >140*   C-Reactive Protein Recent Labs    11/12/20 1122  CRP 25.1*    Microbiology: stenotrophomonas Studies/Results: DG Abd 1 View  Result Date: 11/14/2020 CLINICAL DATA:  Feeding tube placement EXAM: ABDOMEN - 1 VIEW COMPARISON:  None. FINDINGS: Soft feeding tube tip is at the gastroesophageal junction. Bowel gas pattern unremarkable. IMPRESSION: Soft feeding tube tip at the gastroesophageal junction. Electronically Signed   By: Nelson Chimes M.D.   On: 11/14/2020 14:24   DG Abd 1 View  Result Date: 11/14/2020 CLINICAL DATA:  NG tube placement EXAM: ABDOMEN - 1 VIEW COMPARISON:  11/11/2020 FINDINGS: Weighted feeding tube terminates in the distal gastric antrum. Nonobstructive bowel gas  pattern. Degenerative changes of the lumbar spine. IMPRESSION: Weighted feeding tube terminates in the distal gastric antrum. Electronically Signed   By: Julian Hy M.D.   On: 11/14/2020 09:42     Assessment/Plan: stenotrophomonas pneumonia = continue on minocycline. Plan for at least 7 days possibly 14 days depending on response to therapy  ARDS, post-covid pneumonia = continue on current vent support  Encephalopathy = hopefully will see more arousal with sedation off.    [facility] for Infectious Diseases Cell: [phone] Pager: [phone]  11/14/2020, 3:28 PM

## 2020-11-14 NOTE — Anesthesia Postprocedure Evaluation (Signed)
Anesthesia Post Note  Patient: Jackson Figueroa  Procedure(s) Performed: TRACHEOSTOMY (N/A Neck)     Patient location during evaluation: ICU Anesthesia Type: General Level of consciousness: sedated and patient remains intubated per anesthesia plan Pain management: pain level controlled Vital Signs Assessment: post-procedure vital signs reviewed and stable Respiratory status: patient remains intubated per anesthesia plan and patient on ventilator - see flowsheet for VS Cardiovascular status: blood pressure returned to baseline and stable Postop Assessment: no headache and no apparent nausea or vomiting Anesthetic complications: no   No complications documented.  Last Vitals:  Vitals:   11/14/20 1000 11/14/20 1030  BP: 132/88 128/83  Pulse: (!) 105 (!) 104  Resp: 12 11  Temp:    SpO2: 91% 92%    Last Pain:  Vitals:   11/14/20 0746  TempSrc: Axillary  PainSc:                  [place] Reka Wist

## 2020-11-14 NOTE — Progress Notes (Signed)
[facility] Progress Note    Assessment/ Plan:   1. AKI - acute kidney injury, nonoliguric w/ rising creat / BUN due to shock +/-  hemodynamics/ +/-COVID-19 effects. [place] w/o hydro, normal appearing kidneys. UA +hematuria/ pyuria. Creat 1.1 on admit, worsening up to 5.4 w/ BUN 178 so pt was started on CRRT 12/16.Marland Kitchen Heparin increased in filter, at 1000 u /hr for additional clotting and has been doing well since then.  Now that he has a trach and BP is better, may need to consider stopping CRRT in the near future (next 24-48 hrs) and switching to IHD which would require transfer to [facility].  Will also need to think about tunneling HD catheter soon.  Has had very little UOP this week 2. BP/ volume - off pressors, Bps variable, midodrine added. Continue pulling 50 mL/ hr 3. COVID pna/ ARDS - per CCM, mult infiltrates on CXR. As above. S/p trach 12/23.  Still having issues with agitation, trying to decrease sedation as tolerated  4. MDR Stenotrophomonas: ID back on board, getting antibiotics.   5. Diarrhea - rectal tube 6. Slow gastric transit: NGT clamped, now getting trickle feeds 7. Dispo: remains in ICU   Subjective:    S/p trach in OR today.  NGT dislodged, RN staff trying to get a new one in.  Tachypneic, agitated.     Objective:   BP (!) 154/96   Pulse (!) 105   Temp 97.7 F (36.5 C) (Axillary)   Resp 15   Ht 6' (1.829 m)   Wt 116.3 kg   SpO2 94%   BMI 34.77 kg/m   Intake/Output Summary (Last 24 hours) at 11/14/2020 1517 Last data filed at 11/14/2020 1400 Gross per 24 hour  Intake 984.21 ml  Output 1669 ml  Net -684.79 ml   Weight change: -0.7 kg  Physical Exam: Gen: appears agitated HEENT: trach in place small amount of blood CVS: tachycardic Resp: bilateral coarse breath sounds Abd: soft, somewhat distended, maybe a little softer than yesterday Ext: 1+ anasarca, improved  Imaging: DG Abd 1 View  Result Date: 11/14/2020 CLINICAL DATA:  Feeding tube  placement EXAM: ABDOMEN - 1 VIEW COMPARISON:  None. FINDINGS: Soft feeding tube tip is at the gastroesophageal junction. Bowel gas pattern unremarkable. IMPRESSION: Soft feeding tube tip at the gastroesophageal junction. Electronically Signed   By: Nelson Chimes M.D.   On: 11/14/2020 14:24   DG Abd 1 View  Result Date: 11/14/2020 CLINICAL DATA:  NG tube placement EXAM: ABDOMEN - 1 VIEW COMPARISON:  11/11/2020 FINDINGS: Weighted feeding tube terminates in the distal gastric antrum. Nonobstructive bowel gas pattern. Degenerative changes of the lumbar spine. IMPRESSION: Weighted feeding tube terminates in the distal gastric antrum. Electronically Signed   By: Julian Hy M.D.   On: 11/14/2020 09:42    Labs: BMET Recent Labs  Lab 11/11/20 1826 11/12/20 0739 11/12/20 1532 11/12/20 2251 11/13/20 0322 11/13/20 1750 11/14/20 0436  NA 133* 134* 133* 133* 133* 135 135  K 4.3 4.3 5.6* 5.8* 5.2* 3.9 4.1  CL 101 100 100 100 100 100 99  CO2 $Re'23 24 23 22 24 22 25  'gkx$ GLUCOSE 124* 98 149* 141* 134* 156* 137*  BUN 39* 36* 42* 46* 45* 49* 62*  CREATININE 1.66* 1.78* 2.04* 2.00* 1.90* 1.79* 2.13*  CALCIUM 7.9* 8.3* 8.3* 8.2* 8.1* 8.5* 8.8*  PHOS 4.4 4.0 5.3* 4.5 3.6 3.0 4.1   CBC Recent Labs  Lab 11/08/20 0513 11/09/20 0322 11/10/20 0335 11/11/20 2585  11/12/20 0739 11/13/20 0322  WBC 9.6   < > 11.5* 14.5* 11.9* 17.5*  NEUTROABS 7.9*  --   --   --   --   --   HGB 9.0*   < > 9.2* 9.2* 8.8* 9.4*  HCT 28.4*   < > 28.4* 28.4* 28.3* 30.1*  MCV 101.4*   < > 101.1* 101.4* 106.4* 105.6*  PLT 128*   < > 130* 141* 124* 135*   < > = values in this interval not displayed.    Medications:    . albuterol  2.5 mg Nebulization QID  . chlorhexidine gluconate (MEDLINE KIT)  15 mL Mouth Rinse BID  . Chlorhexidine Gluconate Cloth  6 each Topical Q0600  . clonazePAM  2 mg Oral BID  . cloNIDine  0.2 mg Per Tube TID  . collagenase   Topical Daily  . famotidine  20 mg Per Tube Daily  . feeding  supplement (PIVOT 1.5 CAL)  1,000 mL Per Tube Q24H  . feeding supplement (PROSource TF)  90 mL Per Tube QID  . heparin injection (subcutaneous)  5,000 Units Subcutaneous Q8H  . HYDROmorphone HCl  3 mg Oral Q4H  . linagliptin  5 mg Per Tube Daily  . mouth rinse  15 mL Mouth Rinse 10 times per day  . metoCLOPramide (REGLAN) injection  10 mg Intravenous Q6H  . midodrine  10 mg Per Tube Q8H  . minocycline  100 mg Per Tube BID  . multivitamin  15 mL Per Tube Daily  . predniSONE  40 mg Per Tube Q breakfast  . sodium chloride flush  10-40 mL Intracatheter Q12H  . thiamine  100 mg Per Tube Daily  . valproic acid  500 mg Per Tube Q6H      Madelon Lips MD 11/14/2020, 3:17 PM

## 2020-11-14 NOTE — Progress Notes (Signed)
Patient ID: Jackson Figueroa, male   DOB: 16-Jun-1961, 59 y.o.   MRN: [id number] No changes. To OR for tracheostomy.

## 2020-11-14 NOTE — Progress Notes (Signed)
CBC drawn and sent to lab if needed

## 2020-11-14 NOTE — Progress Notes (Signed)
NAME:  Jackson Figueroa, MRN:  [id number], DOB:  1960-11-29, LOS: 83 ADMISSION DATE:  10/20/2020, CONSULTATION DATE:  11/29 REFERRING MD:  Sabra Heck, CHIEF COMPLAINT:  ARDS COVID 73   Brief History   59 year old male with no significant past medical history and a positive COVID-19 test on 11/26 presented to [facility] with profound hypoxemia on 11/28.  Intubated immediately upon arrival found to be in ARDS. Transferred to [facility] hospital for ongoing ICU care in the early a.m. hours of 11/29.  Past Medical History  Obesity  Significant Hospital Events   11/28 Presented to [facility] ER, intubated for hypoxemia, empiric abx, shock on levo. COVID Rx 11/29 Persistent shock, central line placed, ARDS protocol continued, proning initiated 11/30 Pressor requirements continued, NMB initiated to facilitate ventilation tube feeds started, initiated proning 12/01 Stopping NMB, proning positioning protocol discontinued, free water for rising creatinine and sodium 12/02 PEEP trending down.  Renal function improved. Changed to versed from prop gtt d/t rising triglycerides  12/03 Remdesivir completed. Peep/FIO2 stable. Free water adjusted. Solumedrol reduced, fentanyl to Dilaudid, adding Oxycodone  12/04 Dyssynchrony, given paralytic overnight, changed to ketamine; vomited / aspirated, restarted paralytics 12/06 Proned, under NMB 12/07 Proning, NMB continued. Switched antibiotics for stenotrophomonas 12/13 Stopped oxycodone, added dilaudid oral, continuous nimbex infusion to prn, palliative care consulted 12/14 Changed to pressure control 12/15 Worsening vent synchrony after clamping OG tube 12/18 added reglan IV for ? Ileus developing  12/18 back on paralytics for "guppy breathing/ air stacking" on PCV ? ,mucus plug  12/19  Back on PCV and off paralytics  12/20 having intermittent fevers, atrial fibrillation with some ST changes.  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 Started on minocycline per ID 12/22 stable decreased steroid dosing. Clonazepam added 12/23 trach placed. Versed off. Changed dilaudid from 4mg -.3mg , added clonidine and increased depakote. Added midodrine. OGT out during surg. Post-pyloric dislodged. Tip in stomach. Attempting trickle feeds w/ reglan  Consults:   Nephrology 12/14 Palliative 12/13 ID signed off 12/17  Procedures:   ETT 11/28 >>trach 12/23 [facility] CVL 11/29 >> 12/9 R PICC 12/9 >>  R IJ HD Cath 12/17 >>  Significant Diagnostic Tests:   ECHO 11/29 >> LVEF 50-55%, mild LVH, RV systolic function mildly reduced. Mild dilation of ascending aorta 60mm. LE Venous Duplex 12/8 >> negative for DVT   Micro Data:   COVID 11/28 >> Positive  Influenza A/B 11/28 >> negative  UC 11/28 >> negative  BCx2 11/28 >> negative  Tracheal aspirate 12/5 >> Stenotrophomonas maltophilia >> S-bactrim, levofloxacin UC 12/11 >> negative  Tracheal aspirate 12/11 >> Stenotrophomonas maltophilia >> S-bactrim, levofloxacin  BCx2 12/11 >> negative  Trach 12/ 19 >>> multidrug resistance stenotrophomonas  Antimicrobials/covid rx:   CTX 11/28 > 11/29 Azithromycin 11/28 > 11/29 Remdesivir 11/28 > 12/2 Prednisone 11/28 >  Baricitinib 12/4 > 12/5 Vanco 12/5 >> 12/7, 12/11 x1 Cefepime 12/5 >> 12/7, 12/11 >> 12/13 Levaquin 12/7 >> 12/16 Minocycline 12/21>>>   Interim history/subjective:  Had his trach placed. Looks comfortable  Objective   Blood pressure 133/65, pulse (Abnormal) 111, temperature 97.6 F (36.4 C), temperature source Axillary, resp. rate 13, height 6' (1.829 m), weight 116.3 kg, SpO2 90 %.    Vent Mode: PCV FiO2 (%):  [40 %-60 %] 60 % Set Rate:  [15 bmp] 15 bmp PEEP:  [5 cmH20] 5  cmH20 Pressure Support:  [5 cmH20] 5 cmH20 Plateau  Pressure:  [17 cmH20-19 cmH20] 17 cmH20   Intake/Output Summary (Last 24 hours) at 11/14/2020 1019 Last data filed at 11/14/2020 0700 Gross per 24 hour  Intake 1126.32 ml  Output 1710 ml  Net -583.68 ml   Filed Weights   11/12/20 0500 11/13/20 0338 11/14/20 0239  Weight: 122.4 kg 117 kg 116.3 kg    Examination:  General this is a 59 year old male who remains vent dependent  HENT MM are dry. Now w/ 8 cuffed trach.  Pulm scattered course rhonchi. Excellent VT on PSV of 8 peep 5 ranging in the 900 range Card RRR abd OGT out. 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 Now that has trach will try a trial of ATC. Would accept RR in mid top high 30s if hemodynamics stable with this Pulse ox ogal > 88 Volume removal via CRRT rx PNA Taper steroids Cont BDs   Stenotrophomonas pneumonia, persistent on repeat culture; now multidrug-resistant, treating as an active infection Plan Day 3 of 7 Minocycline as directed by ID  Acute metabolic encephalopathy -This is been his primary barrier to extubation; Plan Plan Cont RASS goal -1 Getting Clonazepam 2mg  q 12 Dc versed today Inc valproic acid to 500 q 4 VT Will begin to reduce dilaudid gtt by 25% every 2 days starting 12/23; -->down to 3mg /hr If vent compliance is issue before heavy sedation will place on ATC to see how that is tolerated Adding clonopin and also midodrine to counter act potential hypotension   Vomiting, slow GI transit rule out ileus -TF restarted 12/17, no further vomiting As of 12/23 FT is retracted to stomach after getting dislodged  Plan Cont reglan  tubefeed at 19ml/hr Check  residuals-->if high will need to have IR assist w/ placing post-pyloric again    Non-oliguric AKI   -Has been on CRRT, metabolic status and renal function stable Plan To resume CRRT w/ goal to remove volume Hoping as we come off sedating meds we can get him on iHD   Fluid and electrolyte imbalance hyponatremia 12/18 > worse 12/19; now better Plan Trend and correct as needed  Diarrhea Plan Monitor   Anemia of acute dz Hemoglobin is been stable Plan Trend cbc  Transfuse for hgb < 7  Best practice (evaluated daily)  Diet: tube feeding; placed on hold 12/20, resuming postpyloric feedings 12/21, FT pulled back to stomach inadvertently 12/23 Pain/Anxiety/Delirium protocol (if indicated): as above, continues to require high dose sedation; versed stopped 12/23; starting taper dilaudid  VAP protocol (if indicated): yes DVT prophylaxis: heparin sq  GI prophylaxis: famotidine Glucose control: off Mobility: bed rest Last date of multidisciplinary goals of care discussion 12/21 with sister.   Family and staff present n/a Summary of discussion n/a Follow up goals of care discussion due: 12/29; family still wants full scope of care  Code Status: full code  Disposition: ICU My cct 32 min Erick Colace ACNP-BC [place] Pager # [phone] OR # [phone] if no answer

## 2020-11-14 NOTE — Plan of Care (Signed)
Discussed with family in front of patient plan of care for the evening, pain management and secretions with some teach back displayed by family and no evidence of learning by patient at this time.  Problem: Health Behavior/Discharge Planning: Goal: Ability to manage health-related needs will improve Outcome: Progressing   Problem: Education: Goal: Knowledge of General Education information will improve Description: Including pain rating scale, medication(s)/side effects and non-pharmacologic comfort measures Outcome: Progressing

## 2020-11-14 NOTE — Transfer of Care (Signed)
Immediate Anesthesia Transfer of Care Note  Patient: Jackson Figueroa  Procedure(s) Performed: TRACHEOSTOMY (N/A Neck)  Patient Location: PACU and ICU  Anesthesia Type:General  Level of Consciousness: Patient remains intubated per anesthesia plan  Airway & Oxygen Therapy: Patient connected to tracheostomy mask oxygen  Post-op Assessment: Report given to RN and Post -op Vital signs reviewed and stable  Post vital signs: Reviewed and stable  Last Vitals:  Vitals Value Taken Time  BP    Temp    Pulse    Resp    SpO2      Last Pain:  Vitals:   11/14/20 0746  TempSrc: Axillary  PainSc:          Complications: No complications documented.

## 2020-11-14 NOTE — Progress Notes (Addendum)
Patient was running on CRRT with 10000 units 9ml/hr of Heparin.    Paged Dr. Marval Regal with Nephrology since patient has trach procedure at [place] on 11/14/20.  Told we could stop it after midnight and as late as 0600 if necessary.    Called Dr. Constance Holster with Otolaryngology to confer when they would like the CRRT stopped and stated midnight.  Patient's CRRT with Heparin stopped at 0015 on 11/14/20 with a blood return of 149 ml of blood.    Patient's Pivot 1.5 feeding tube (30 ml/hr) stopped at 0001.  Will monitor patient's blood sugars every 2 hours til transferred to OR department since he was previously on D5W to maintain blood glucose levels.  He has had tube feeding stopped and restarted over several days due to risk for aspiration.  Will wean patient off Versed starting around 0230 to 0330.  Had Klonopin rescheduled to be able to give second dose around 0400/0500 this morning.

## 2020-11-15 ENCOUNTER — Inpatient Hospital Stay (HOSPITAL_COMMUNITY): Payer: BC Managed Care – PPO

## 2020-11-15 LAB — CBC WITH DIFFERENTIAL/PLATELET
Abs Immature Granulocytes: 0.71 10*3/uL — ABNORMAL HIGH (ref 0.00–0.07)
Basophils Absolute: 0 10*3/uL (ref 0.0–0.1)
Basophils Relative: 0 %
Eosinophils Absolute: 0 10*3/uL (ref 0.0–0.5)
Eosinophils Relative: 0 %
HCT: 24.5 % — ABNORMAL LOW (ref 39.0–52.0)
Hemoglobin: 7.8 g/dL — ABNORMAL LOW (ref 13.0–17.0)
Immature Granulocytes: 5 %
Lymphocytes Relative: 6 %
Lymphs Abs: 0.8 10*3/uL (ref 0.7–4.0)
MCH: 32.6 pg (ref 26.0–34.0)
MCHC: 31.8 g/dL (ref 30.0–36.0)
MCV: 102.5 fL — ABNORMAL HIGH (ref 80.0–100.0)
Monocytes Absolute: 1.6 10*3/uL — ABNORMAL HIGH (ref 0.1–1.0)
Monocytes Relative: 11 %
Neutro Abs: 11.1 10*3/uL — ABNORMAL HIGH (ref 1.7–7.7)
Neutrophils Relative %: 78 %
Platelets: 141 10*3/uL — ABNORMAL LOW (ref 150–400)
RBC: 2.39 MIL/uL — ABNORMAL LOW (ref 4.22–5.81)
RDW: 14.6 % (ref 11.5–15.5)
WBC: 14.3 10*3/uL — ABNORMAL HIGH (ref 4.0–10.5)
nRBC: 0.1 % (ref 0.0–0.2)

## 2020-11-15 LAB — RENAL FUNCTION PANEL
Albumin: 2.1 g/dL — ABNORMAL LOW (ref 3.5–5.0)
Albumin: 2.2 g/dL — ABNORMAL LOW (ref 3.5–5.0)
Anion gap: 11 (ref 5–15)
Anion gap: 11 (ref 5–15)
BUN: 54 mg/dL — ABNORMAL HIGH (ref 6–20)
BUN: 53 mg/dL — ABNORMAL HIGH (ref 6–20)
CO2: 23 mmol/L (ref 22–32)
CO2: 23 mmol/L (ref 22–32)
Calcium: 8.4 mg/dL — ABNORMAL LOW (ref 8.9–10.3)
Calcium: 8.4 mg/dL — ABNORMAL LOW (ref 8.9–10.3)
Chloride: 100 mmol/L (ref 98–111)
Chloride: 102 mmol/L (ref 98–111)
Creatinine, Ser: 2.02 mg/dL — ABNORMAL HIGH (ref 0.61–1.24)
Creatinine, Ser: 1.94 mg/dL — ABNORMAL HIGH (ref 0.61–1.24)
GFR, Estimated: 38 mL/min — ABNORMAL LOW (ref >60)
GFR, Estimated: 40 mL/min — ABNORMAL LOW (ref >60)
Glucose, Bld: 105 mg/dL — ABNORMAL HIGH (ref 70–99)
Glucose, Bld: 102 mg/dL — ABNORMAL HIGH (ref 70–99)
Phosphorus: 3.5 mg/dL (ref 2.5–4.6)
Phosphorus: 3.7 mg/dL (ref 2.5–4.6)
Potassium: 4.2 mmol/L (ref 3.5–5.1)
Potassium: 4.4 mmol/L (ref 3.5–5.1)
Sodium: 134 mmol/L — ABNORMAL LOW (ref 135–145)
Sodium: 136 mmol/L (ref 135–145)

## 2020-11-15 LAB — GLUCOSE, CAPILLARY
Glucose-Capillary: 86 mg/dL (ref 70–99)
Glucose-Capillary: 81 mg/dL (ref 70–99)
Glucose-Capillary: 102 mg/dL — ABNORMAL HIGH (ref 70–99)
Glucose-Capillary: 92 mg/dL (ref 70–99)
Glucose-Capillary: 97 mg/dL (ref 70–99)

## 2020-11-15 LAB — MAGNESIUM: Magnesium: 2.9 mg/dL — ABNORMAL HIGH (ref 1.7–2.4)

## 2020-11-15 LAB — APTT: aPTT: 38 seconds — ABNORMAL HIGH (ref 24–36)

## 2020-11-15 MED ORDER — PIVOT 1.5 CAL PO LIQD
1000.0000 mL | ORAL | Status: DC
  Administered 2020-11-15 – 2020-11-20 (×6): 1000 mL
  Filled 2020-11-15 (×7): qty 1000

## 2020-11-15 MED ORDER — IOHEXOL 300 MG/ML  SOLN: 50.0000 mL | Freq: Once | INTRAMUSCULAR | Status: DC | PRN

## 2020-11-15 MED ORDER — MINOCYCLINE HCL 50 MG PO CAPS
100.0000 mg | ORAL_CAPSULE | Freq: Two times a day (BID) | ORAL | Status: AC
  Administered 2020-11-15 – 2020-11-19 (×9): 100 mg
  Filled 2020-11-15 (×8): qty 2
  Filled 2020-11-15: qty 1
  Filled 2020-11-15 (×2): qty 2

## 2020-11-15 NOTE — Progress Notes (Signed)
[facility] for Infectious Disease    Date of Admission:  10/20/2020   Total days of antibiotics 4 minocycline          ID: Jackson Figueroa is a 59 y.o. male with   Active Problems:   Acute respiratory failure due to COVID-19 [facility])   Acute respiratory distress syndrome (ARDS) due to COVID-19 virus ([place])   Septic shock ([place])   AKI (acute kidney injury) ([place])   Protein calorie malnutrition (H[facility])   Acute metabolic encephalopathy   Ileus (HCC)   Acute respiratory failure with hypoxemia (H[facility])   History of infection by MDR Stenotrophomonas maltophilia    Subjective: Remains afebrile, no longer on any sedation since tracheostomy, still slow to arise,  ROS unable to obtain since not responding to questions/ Medications:  . albuterol  2.5 mg Nebulization QID  . chlorhexidine gluconate (MEDLINE KIT)  15 mL Mouth Rinse BID  . Chlorhexidine Gluconate Cloth  6 each Topical Q0600  . cloNIDine  0.3 mg Transdermal Weekly  . collagenase   Topical Daily  . fentaNYL  1 patch Transdermal Q72H  . heparin injection (subcutaneous)  5,000 Units Subcutaneous Q8H  . linagliptin  5 mg Per Tube Daily  . mouth rinse  15 mL Mouth Rinse 10 times per day  . methylPREDNISolone (SOLU-MEDROL) injection  40 mg Intravenous Q12H  . metoCLOPramide (REGLAN) injection  10 mg Intravenous Q6H  . minocycline  100 mg Per Tube Q12H  . sodium chloride flush  10-40 mL Intracatheter Q12H    Objective: Vital signs in last 24 hours: Temp:  [97.6 F (36.4 C)-98.4 F (36.9 C)] 97.9 F (36.6 C) (12/24 1200) Pulse Rate:  [72-111] 105 (12/24 1200) Resp:  [13-33] 21 (12/24 1200) BP: (102-154)/(58-96) 130/79 (12/24 1200) SpO2:  [92 %-100 %] 95 % (12/24 1200) FiO2 (%):  [40 %-50 %] 40 % (12/24 1200) Weight:  [112.7 kg] 112.7 kg (12/24 0500) Physical Exam  Constitutional:  He appears well-developed and well-nourished. No distress.  HENT:  Mouth/Throat: tracheostomy in place with clear secretions Cardiovascular:  tachycardic, regular rhythm and normal heart sounds. Exam reveals no gallop and no friction rub.  No murmur heard.  Pulmonary/Chest: Effort normal and breath sounds normal. No respiratory distress. He has no wheezes.  Abdominal: Soft. Bowel sounds are decreased He exhibits no distension. There is no tenderness.  Skin: Skin is warm and dry. No rash noted. No erythema.     Lab Results Recent Labs    11/13/20 0322 11/13/20 1750 11/14/20 1630 11/15/20 0315  WBC 17.5*  --   --  14.3*  HGB 9.4*  --   --  7.8*  HCT 30.1*  --   --  24.5*  NA 133*   < > 135 134*  K 5.2*   < > 4.7 4.2  CL 100   < > 100 100  CO2 24   < > 23 23  BUN 45*   < > 69* 54*  CREATININE 1.90*   < > 2.18* 2.02*   < > = values in this interval not displayed.   Liver Panel Recent Labs    11/14/20 1630 11/15/20 0315  ALBUMIN 2.3* 2.1*   Sedimentation Rate No results for input(s): ESRSEDRATE in the last 72 hours. C-Reactive Protein No results for input(s): CRP in the last 72 hours.  Microbiology:  Studies/Results: DG Abd 1 View  Result Date: 11/14/2020 CLINICAL DATA:  Feeding tube placement EXAM: ABDOMEN - 1 VIEW COMPARISON:  None.  FINDINGS: Soft feeding tube tip is at the gastroesophageal junction. Bowel gas pattern unremarkable. IMPRESSION: Soft feeding tube tip at the gastroesophageal junction. Electronically Signed   By: Nelson Chimes M.D.   On: 11/14/2020 14:24   DG Abd 1 View  Result Date: 11/14/2020 CLINICAL DATA:  NG tube placement EXAM: ABDOMEN - 1 VIEW COMPARISON:  11/11/2020 FINDINGS: Weighted feeding tube terminates in the distal gastric antrum. Nonobstructive bowel gas pattern. Degenerative changes of the lumbar spine. IMPRESSION: Weighted feeding tube terminates in the distal gastric antrum. Electronically Signed   By: Julian Hy M.D.   On: 11/14/2020 09:42   DG [place] Dys Tube Plc W/Fl W/Rad  Result Date: 11/15/2020 CLINICAL DATA:  Feeding tube placement EXAM: NASO G TUBE PLACEMENT WITH  FL AND WITH RAD CONTRAST:  No contrast used FLUOROSCOPY TIME:  Fluoroscopy Time:  4.4 minutes Radiation Exposure Index (if provided by the fluoroscopic device): 110 mGy Number of Acquired Spot Images: 1 COMPARISON:  Radiograph 01/16/2020 FINDINGS: Patient's LEFT nare was topically anesthetized with viscous lidocaine. Feeding tube was advanced into the stomach. Multiple attempts were made to advance into the proximal duodenum unsuccessfully. Amplatz wire was utilized without success. Multiple attempts resulted in tube coiling in patient's mouth. Feeding tube was ultimately positioned in the stomach. Stylet removed. IMPRESSION: Feeding tube advanced to the stomach.  Stylet removed. Unsuccessful attempts to advance into duodenum. Electronically Signed   By: Suzy Bouchard M.D.   On: 11/15/2020 09:08     Assessment/Plan: stenotrophomonas MDR pneumonia= currently on day 4 of 7-14 days of minocycline. Would reassess how he is doing on day 7 to decide if need to extend antibiotics given its MDRO. He is leukocytosis slightly improved, and afebrile since starting minocycline  ARDS post covid, requiring tracheostomy = continue on PS, FiO2 40%. Managed by PCCM  Encephalopathy = multifactorial, continue with neuro checks, anticipate to have some improvement over the next few days  aki = remains on CRRT  Will sign off.  [facility] Cell: [phone] Pager: [phone]  11/15/2020, 12:16 PM

## 2020-11-15 NOTE — Progress Notes (Signed)
[facility] Progress Note    Assessment/ Plan:   1. AKI - acute kidney injury, nonoliguric w/ rising creat / BUN due to shock +/-  hemodynamics/ +/-COVID-19 effects. [place] w/o hydro, normal appearing kidneys. UA +hematuria/ pyuria. Creat 1.1 on admit, worsening up to 5.4 w/ BUN 178 so pt was started on CRRT 12/16.Marland Kitchen Heparin increased in filter, at 1000 u /hr for additional clotting and has been doing well since then.  Now that he has a trach and BP is better, may need to consider stopping CRRT in the near future (next 24-48 hrs) and switching to IHD which would require transfer to [facility].  Will also need to think about tunneling HD catheter soon.  Has had very little UOP this week but he had no renal issues before so hopefully he can recover.  Will target holding CRRT 12/26 to assess UOP/ renal recovery/ need for transfer for IHD. 2. BP/ volume - off pressors, Bps variable, midodrine added. Continue pulling 50 mL/ hr 3. COVID pna/ ARDS - per CCM, mult infiltrates on CXR. As above. S/p trach 12/23.  Goal per PCCM is to transition to PO pain meds to wake up a little more which is doing already 4. MDR Stenotrophomonas: ID back on board, getting antibiotics (minocycline).   5. Diarrhea - rectal tube 6. Slow gastric transit: has Coretrak 7. Dispo: remains in ICU   Subjective:    Got a Coretrak in this AM.  Did well.  CRRT to restart.  Tolerating well including UF 50 mL/ hr   Objective:   BP 134/63 (BP Location: Left Arm)   Pulse 88   Temp 97.6 F (36.4 C) (Oral)   Resp 20   Ht 6' (1.829 m)   Wt 112.7 kg   SpO2 92%   BMI 33.70 kg/m   Intake/Output Summary (Last 24 hours) at 11/15/2020 1011 Last data filed at 11/15/2020 0900 Gross per 24 hour  Intake 744.58 ml  Output 1928 ml  Net -1183.42 ml   Weight change: -3.6 kg  Physical Exam: Gen: comfortable, breathing regularly, on PS trial HEENT: trach in place  CVS: tachycardic Resp: bilateral coarse breath sounds Abd: soft,  somewhat distended, maybe a little softer than yesterday Ext: 1+ anasarca, improved  Imaging: DG Abd 1 View  Result Date: 11/14/2020 CLINICAL DATA:  Feeding tube placement EXAM: ABDOMEN - 1 VIEW COMPARISON:  None. FINDINGS: Soft feeding tube tip is at the gastroesophageal junction. Bowel gas pattern unremarkable. IMPRESSION: Soft feeding tube tip at the gastroesophageal junction. Electronically Signed   By: Nelson Chimes M.D.   On: 11/14/2020 14:24   DG Abd 1 View  Result Date: 11/14/2020 CLINICAL DATA:  NG tube placement EXAM: ABDOMEN - 1 VIEW COMPARISON:  11/11/2020 FINDINGS: Weighted feeding tube terminates in the distal gastric antrum. Nonobstructive bowel gas pattern. Degenerative changes of the lumbar spine. IMPRESSION: Weighted feeding tube terminates in the distal gastric antrum. Electronically Signed   By: Julian Hy M.D.   On: 11/14/2020 09:42   DG Loyce Dys Tube Plc W/Fl W/Rad  Result Date: 11/15/2020 CLINICAL DATA:  Feeding tube placement EXAM: NASO G TUBE PLACEMENT WITH FL AND WITH RAD CONTRAST:  No contrast used FLUOROSCOPY TIME:  Fluoroscopy Time:  4.4 minutes Radiation Exposure Index (if provided by the fluoroscopic device): 110 mGy Number of Acquired Spot Images: 1 COMPARISON:  Radiograph 01/16/2020 FINDINGS: Patient's LEFT nare was topically anesthetized with viscous lidocaine. Feeding tube was advanced into the stomach. Multiple attempts were made  to advance into the proximal duodenum unsuccessfully. Amplatz wire was utilized without success. Multiple attempts resulted in tube coiling in patient's mouth. Feeding tube was ultimately positioned in the stomach. Stylet removed. IMPRESSION: Feeding tube advanced to the stomach.  Stylet removed. Unsuccessful attempts to advance into duodenum. Electronically Signed   By: Stewart  Edmunds M.D.   On: 11/15/2020 09:08    Labs: BMET Recent Labs  Lab 11/12/20 1532 11/12/20 2251 11/13/20 0322 11/13/20 1750 11/14/20 0436  11/14/20 1630 11/15/20 0315  NA 133* 133* 133* 135 135 135 134*  K 5.6* 5.8* 5.2* 3.9 4.1 4.7 4.2  CL 100 100 100 100 99 100 100  CO2 23 22 24 22 25 23 23  GLUCOSE 149* 141* 134* 156* 137* 126* 105*  BUN 42* 46* 45* 49* 62* 69* 54*  CREATININE 2.04* 2.00* 1.90* 1.79* 2.13* 2.18* 2.02*  CALCIUM 8.3* 8.2* 8.1* 8.5* 8.8* 8.6* 8.4*  PHOS 5.3* 4.5 3.6 3.0 4.1 4.9* 3.5   CBC Recent Labs  Lab 11/11/20 0312 11/12/20 0739 11/13/20 0322 11/15/20 0315  WBC 14.5* 11.9* 17.5* 14.3*  NEUTROABS  --   --   --  11.1*  HGB 9.2* 8.8* 9.4* 7.8*  HCT 28.4* 28.3* 30.1* 24.5*  MCV 101.4* 106.4* 105.6* 102.5*  PLT 141* 124* 135* 141*    Medications:    . albuterol  2.5 mg Nebulization QID  . chlorhexidine gluconate (MEDLINE KIT)  15 mL Mouth Rinse BID  . Chlorhexidine Gluconate Cloth  6 each Topical Q0600  . cloNIDine  0.3 mg Transdermal Weekly  . collagenase   Topical Daily  . fentaNYL  1 patch Transdermal Q72H  . heparin injection (subcutaneous)  5,000 Units Subcutaneous Q8H  . linagliptin  5 mg Per Tube Daily  . mouth rinse  15 mL Mouth Rinse 10 times per day  . methylPREDNISolone (SOLU-MEDROL) injection  40 mg Intravenous Q12H  . metoCLOPramide (REGLAN) injection  10 mg Intravenous Q6H  . sodium chloride flush  10-40 mL Intracatheter Q12H        MD 11/15/2020, 10:11 AM  

## 2020-11-15 NOTE — Progress Notes (Signed)
Assisted with transporting PT to IR while on vent- uneventful.

## 2020-11-15 NOTE — Progress Notes (Addendum)
NAME:  Jackson Figueroa, MRN:  [id number], DOB:  June 06, 1961, LOS: 40 ADMISSION DATE:  10/20/2020, CONSULTATION DATE:  11/29 REFERRING MD:  Sabra Heck, CHIEF COMPLAINT:  ARDS COVID 10   Brief History   59 year old male with no significant past medical history and a positive COVID-19 test on 11/26 presented to [facility] with profound hypoxemia on 11/28.  Intubated immediately upon arrival found to be in ARDS. Transferred to [facility] for ongoing ICU care in the early a.m. hours of 11/29.  Past Medical History  Obesity  Significant Hospital Events   11/28 Presented to [facility] ER, intubated for hypoxemia, empiric abx, shock on levo. COVID Rx 11/29 Persistent shock, central line placed, ARDS protocol continued, proning initiated 11/30 Pressor requirements continued, NMB initiated to facilitate ventilation tube feeds started, initiated proning 12/01 Stopping NMB, proning positioning protocol discontinued, free water for rising creatinine and sodium 12/02 PEEP trending down.  Renal function improved. Changed to versed from prop gtt d/t rising triglycerides  12/03 Remdesivir completed. Peep/FIO2 stable. Free water adjusted. Solumedrol reduced, fentanyl to Dilaudid, adding Oxycodone  12/04 Dyssynchrony, given paralytic overnight, changed to ketamine; vomited / aspirated, restarted paralytics 12/06 Proned, under NMB 12/07 Proning, NMB continued. Switched antibiotics for stenotrophomonas 12/13 Stopped oxycodone, added dilaudid oral, continuous nimbex infusion to prn, palliative care consulted 12/14 Changed to pressure control 12/15 Worsening vent synchrony after clamping OG tube 12/18 added reglan IV for ? Ileus developing  12/18 back on paralytics for "guppy breathing/ air stacking" on PCV ? ,mucus plug  12/19  Back on PCV and off paralytics  12/20 having intermittent fevers, atrial fibrillation with some ST changes.  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 Started on minocycline per ID 12/22 stable decreased steroid dosing. Clonazepam added 12/23 trach placed. Versed off. Changed dilaudid from 4mg -.3mg , added clonidine and increased depakote. Added midodrine. OGT out during surg. Post-pyloric dislodged. Tip in stomach. Attempting trickle feeds w/ reglan  Consults:   Nephrology 12/14 Palliative 12/13 ID signed off 12/17  Procedures:   ETT 11/28 >>trach 12/23 [facility] CVL 11/29 >> 12/9 R PICC 12/9 >>  R IJ HD Cath 12/17 >>  Significant Diagnostic Tests:   ECHO 11/29 >> LVEF 50-55%, mild LVH, RV systolic function mildly reduced. Mild dilation of ascending aorta 54mm. LE Venous Duplex 12/8 >> negative for DVT   Micro Data:   COVID 11/28 >> Positive  Influenza A/B 11/28 >> negative  UC 11/28 >> negative  BCx2 11/28 >> negative  Tracheal aspirate 12/5 >> Stenotrophomonas maltophilia >> S-bactrim, levofloxacin UC 12/11 >> negative  Tracheal aspirate 12/11 >> Stenotrophomonas maltophilia >> S-bactrim, levofloxacin  BCx2 12/11 >> negative  Trach 12/ 19 >>> multidrug resistance stenotrophomonas  Antimicrobials/covid rx:   CTX 11/28 > 11/29 Azithromycin 11/28 > 11/29 Remdesivir 11/28 > 12/2 Prednisone 11/28 >  Baricitinib 12/4 > 12/5 Vanco 12/5 >> 12/7, 12/11 x1 Cefepime 12/5 >> 12/7, 12/11 >> 12/13 Levaquin 12/7 >> 12/16 Minocycline 12/21>>>   Interim history/subjective:  Waking up a bit more as sedation is weaned.  Working on going down for fluoro-guided NGT, remains on full vent support.  Objective   Blood pressure 131/65, pulse 72, temperature 97.6 F (36.4 C), temperature source Oral, resp. rate 18, height 6' (1.829 m), weight 112.7 kg, SpO2 96 %.    Vent Mode: PCV FiO2 (%):  [40 %-60 %]  40 % Set Rate:  [15 bmp] 15 bmp PEEP:  [5  cmH20] 5 cmH20 Pressure Support:  [8 cmH20] 8 cmH20 Plateau Pressure:  [17 cmH20-27 cmH20] 27 cmH20   Intake/Output Summary (Last 24 hours) at 11/15/2020 0854 Last data filed at 11/15/2020 [id number] Gross per 24 hour  Intake 724.3 ml  Output 1928 ml  Net -1203.7 ml   Filed Weights   11/13/20 0338 11/14/20 0239 11/15/20 0500  Weight: 117 kg 116.3 kg 112.7 kg    Examination:  Constitutional: ill appearing man in NAD  Eyes: pupils pinpoint but reactive, not tracking Ears, nose, mouth, and throat: trach in place, some oozing overnight seems to have resolved with thrombipad Cardiovascular: RRR, ext warm Respiratory: Scattered rhonci, copious (but thinning) secretions per nursing Gastrointestinal: Soft, hypoactive BS Skin: No rashes, normal turgor Neurologic: withdraws x 4, not following commands Psychiatric: cannot assess  Chemistries fine with CRRT H/H down a bit today CBG on lower size without TF  Resolved Hospital Problem list     Assessment & Plan:   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  Trach placed 12/23 by ENT Acute metabolic encephalopathy- related to prolonged critical illness, various metabolic perturbations, and prolonged use of sedatives. Stenotrophomonas pneumonia vs. tracheitis - Work toward [employer] trials - Wean off opiate gtts once have PO access (was on valproic acid, clonazepam) - Minocycline with 7-14 day therapy planned per ID  Slow GI transit- postpyloric dobhoff to be placed fluoroscopically today then restart TF, continue reglan  Post trach bleeding, anemia- seems to have eased up, monitor CBC  Multifactorial ATN- CRRT per nephrology  Best practice (evaluated daily)  Diet: TF once tube back in Pain/Anxiety/Delirium protocol (if indicated): as above VAP protocol (if indicated): yes DVT prophylaxis: heparin sq  GI prophylaxis:  famotidine Glucose control: off Mobility: bed rest Last date of multidisciplinary goals of care discussion 12/21 with sister.   Family and staff present n/a Summary of discussion n/a Follow up goals of care discussion due: 12/29; family still wants full scope of care  Code Status: full code  Disposition: ICU   Patient critically ill due to metabolic encephalopathy Interventions to address this today weaning sedation Risk of deterioration without these interventions is high  I personally spent 34 minutes providing critical care not including any separately billable procedures  Erskine Emery MD [facility] 11/15/2020 9:01 AM Personal pager: [phone] If unanswered, please page CCM On-call: [phone]

## 2020-11-15 NOTE — Progress Notes (Signed)
CRRT stopped and 140 ml of blood returned.

## 2020-11-16 LAB — GLUCOSE, CAPILLARY
Glucose-Capillary: 115 mg/dL — ABNORMAL HIGH (ref 70–99)
Glucose-Capillary: 115 mg/dL — ABNORMAL HIGH (ref 70–99)
Glucose-Capillary: 97 mg/dL (ref 70–99)
Glucose-Capillary: 122 mg/dL — ABNORMAL HIGH (ref 70–99)
Glucose-Capillary: 103 mg/dL — ABNORMAL HIGH (ref 70–99)
Glucose-Capillary: 102 mg/dL — ABNORMAL HIGH (ref 70–99)

## 2020-11-16 LAB — CBC WITH DIFFERENTIAL/PLATELET
Abs Immature Granulocytes: 1.48 10*3/uL — ABNORMAL HIGH (ref 0.00–0.07)
Basophils Absolute: 0.1 10*3/uL (ref 0.0–0.1)
Basophils Relative: 1 %
Eosinophils Absolute: 0.1 10*3/uL (ref 0.0–0.5)
Eosinophils Relative: 0 %
HCT: 29.3 % — ABNORMAL LOW (ref 39.0–52.0)
Hemoglobin: 9.3 g/dL — ABNORMAL LOW (ref 13.0–17.0)
Immature Granulocytes: 8 %
Lymphocytes Relative: 8 %
Lymphs Abs: 1.6 10*3/uL (ref 0.7–4.0)
MCH: 32.4 pg (ref 26.0–34.0)
MCHC: 31.7 g/dL (ref 30.0–36.0)
MCV: 102.1 fL — ABNORMAL HIGH (ref 80.0–100.0)
Monocytes Absolute: 2.3 10*3/uL — ABNORMAL HIGH (ref 0.1–1.0)
Monocytes Relative: 12 %
Neutro Abs: 14.2 10*3/uL — ABNORMAL HIGH (ref 1.7–7.7)
Neutrophils Relative %: 71 %
Platelets: 173 10*3/uL (ref 150–400)
RBC: 2.87 MIL/uL — ABNORMAL LOW (ref 4.22–5.81)
RDW: 14.7 % (ref 11.5–15.5)
WBC: 19.8 10*3/uL — ABNORMAL HIGH (ref 4.0–10.5)
nRBC: 0.2 % (ref 0.0–0.2)

## 2020-11-16 LAB — RENAL FUNCTION PANEL
Albumin: 2.4 g/dL — ABNORMAL LOW (ref 3.5–5.0)
Albumin: 2.5 g/dL — ABNORMAL LOW (ref 3.5–5.0)
Anion gap: 11 (ref 5–15)
Anion gap: 8 (ref 5–15)
BUN: 48 mg/dL — ABNORMAL HIGH (ref 6–20)
BUN: 46 mg/dL — ABNORMAL HIGH (ref 6–20)
CO2: 25 mmol/L (ref 22–32)
CO2: 25 mmol/L (ref 22–32)
Calcium: 9.1 mg/dL (ref 8.9–10.3)
Calcium: 8.9 mg/dL (ref 8.9–10.3)
Chloride: 100 mmol/L (ref 98–111)
Chloride: 101 mmol/L (ref 98–111)
Creatinine, Ser: 1.68 mg/dL — ABNORMAL HIGH (ref 0.61–1.24)
Creatinine, Ser: 1.59 mg/dL — ABNORMAL HIGH (ref 0.61–1.24)
GFR, Estimated: 47 mL/min — ABNORMAL LOW (ref >60)
GFR, Estimated: 50 mL/min — ABNORMAL LOW (ref >60)
Glucose, Bld: 115 mg/dL — ABNORMAL HIGH (ref 70–99)
Glucose, Bld: 123 mg/dL — ABNORMAL HIGH (ref 70–99)
Phosphorus: 3 mg/dL (ref 2.5–4.6)
Phosphorus: 3.4 mg/dL (ref 2.5–4.6)
Potassium: 4.2 mmol/L (ref 3.5–5.1)
Potassium: 4.7 mmol/L (ref 3.5–5.1)
Sodium: 136 mmol/L (ref 135–145)
Sodium: 134 mmol/L — ABNORMAL LOW (ref 135–145)

## 2020-11-16 LAB — APTT: aPTT: 32 seconds (ref 24–36)

## 2020-11-16 LAB — MAGNESIUM: Magnesium: 2.8 mg/dL — ABNORMAL HIGH (ref 1.7–2.4)

## 2020-11-16 MED ORDER — HYDROMORPHONE HCL 1 MG/ML IJ SOLN
1.0000 mg | INTRAMUSCULAR | Status: DC | PRN
  Administered 2020-11-16 – 2020-12-04 (×7): 1 mg via INTRAVENOUS
  Filled 2020-11-16 (×7): qty 1

## 2020-11-16 MED ORDER — ALTEPLASE 2 MG IJ SOLR
2.0000 mg | Freq: Once | INTRAMUSCULAR | Status: AC
  Administered 2020-11-16: 14:00:00 2 mg

## 2020-11-16 MED ORDER — DEXMEDETOMIDINE HCL IN NACL 200 MCG/50ML IV SOLN
0.2000 ug/kg/h | INTRAVENOUS | Status: DC
  Administered 2020-11-16: 21:00:00 0.6 ug/kg/h via INTRAVENOUS
  Administered 2020-11-16: 15:00:00 0.5 ug/kg/h via INTRAVENOUS
  Administered 2020-11-16: 12:00:00 0.6 ug/kg/h via INTRAVENOUS
  Administered 2020-11-16 (×2): 0.4 ug/kg/h via INTRAVENOUS
  Administered 2020-11-17: 01:00:00 0.7 ug/kg/h via INTRAVENOUS
  Administered 2020-11-17 (×2): 0.5 ug/kg/h via INTRAVENOUS
  Administered 2020-11-17: 03:00:00 0.6 ug/kg/h via INTRAVENOUS
  Administered 2020-11-17: 06:00:00 0.4 ug/kg/h via INTRAVENOUS
  Administered 2020-11-18: 18:00:00 0.2 ug/kg/h via INTRAVENOUS
  Filled 2020-11-16 (×13): qty 50

## 2020-11-16 MED ORDER — OXYCODONE HCL 5 MG PO TABS
5.0000 mg | ORAL_TABLET | Freq: Three times a day (TID) | ORAL | Status: DC
Start: 2020-11-16 — End: 2020-11-17
  Administered 2020-11-16 – 2020-11-17 (×3): 5 mg via ORAL
  Filled 2020-11-16 (×3): qty 1

## 2020-11-16 NOTE — Progress Notes (Signed)
[facility] ROUNDING NOTE   Subjective:   Brief history: 59 year old gentleman who does not have a significant past medical history was admitted with Covid pneumonia required intubation due to ARDS and ongoing ICU care.  Blood pressure 120/77 pulse 91 temperature 97.1 O2 sats 97% on ventilator FiO2 40%  Sodium 136 potassium 4.2 chloride 100 CO2 25 BUN 48 creatinine 1.68 glucose 115 calcium 9.1 magnesium 2.8 phosphorus 3 hemoglobin 9.3  Anuric renal failure being kept even on CRRT  Objective:  Vital signs in last 24 hours:  Temp:  [97.1 F (36.2 C)-98.4 F (36.9 C)] 97.1 F (36.2 C) (12/25 0738) Pulse Rate:  [91-108] 91 (12/25 1100) Resp:  [18-32] 28 (12/25 1100) BP: (124-157)/(63-91) 128/77 (12/25 1100) SpO2:  [91 %-100 %] 97 % (12/25 1100) FiO2 (%):  [40 %] 40 % (12/25 0754) Weight:  [109.9 kg] 109.9 kg (12/25 0500)  Weight change: -2.8 kg Filed Weights   11/14/20 0239 11/15/20 0500 11/16/20 0500  Weight: 116.3 kg 112.7 kg 109.9 kg    Intake/Output: I/O last 3 completed shifts: In: 1307.7 [I.V.:571.9; Other:10; NG/GT:574.5; IV Piggyback:151.4] Out: 2706 [Other:2596; Stool:110]   Intake/Output this shift:  Total I/O In: 388.3 [I.V.:68.3; NG/GT:320] Out: 423 [Other:423]  Gen: comfortable, breathing regularly, on PS trial HEENT: trach in place  CVS: tachycardic Resp: bilateral coarse breath sounds Abd: soft, somewhat distended, maybe a little softer than yesterday Ext: 1+ anasarca, improved   Basic Metabolic Panel: Recent Labs  Lab 11/12/20 0739 11/12/20 1532 11/13/20 0322 11/13/20 1750 11/14/20 0436 11/14/20 1630 11/15/20 0315 11/15/20 1617 11/16/20 0352  NA 134*   < > 133*   < > 135 135 134* 136 136  K 4.3   < > 5.2*   < > 4.1 4.7 4.2 4.4 4.2  CL 100   < > 100   < > 99 100 100 102 100  CO2 24   < > 24   < > _0 GLUCOSE 98   < > 134*   < > 137* 126* 105* 102* 115*  BUN 36*   < > 45*   < > 62* 69* 54* 53* 48*  CREATININE  1.78*   < > 1.90*   < > 2.13* 2.18* 2.02* 1.94* 1.68*  CALCIUM 8.3*   < > 8.1*   < > 8.8* 8.6* 8.4* 8.4* 9.1  MG 2.7*  --  2.5*  --  2.9*  --  2.9*  --  2.8*  PHOS 4.0   < > 3.6   < > 4.1 4.9* 3.5 3.7 3.0   < > = values in this interval not displayed.    Liver Function Tests: Recent Labs  Lab 11/10/20 0335 11/10/20 1843 11/11/20 0312 11/11/20 1826 11/14/20 0436 11/14/20 1630 11/15/20 0315 11/15/20 1617 11/16/20 0352  AST 18  --  17  --   --   --   --   --   --   ALT 22  --  21  --   --   --   --   --   --   ALKPHOS 67  --  74  --   --   --   --   --   --   BILITOT 0.4  --  0.5  --   --   --   --   --   --   PROT 7.1  --  7.4  --   --   --   --   --   --  ALBUMIN 2.1*  2.1*   < > 2.1*  2.1*   < > 2.1* 2.3* 2.1* 2.2* 2.4*   < > = values in this interval not displayed.   No results for input(s): LIPASE, AMYLASE in the last 168 hours. No results for input(s): AMMONIA in the last 168 hours.  CBC: Recent Labs  Lab 11/11/20 0312 11/12/20 0739 11/13/20 0322 11/15/20 0315 11/16/20 0352  WBC 14.5* 11.9* 17.5* 14.3* 19.8*  NEUTROABS  --   --   --  11.1* 14.2*  HGB 9.2* 8.8* 9.4* 7.8* 9.3*  HCT 28.4* 28.3* 30.1* 24.5* 29.3*  MCV 101.4* 106.4* 105.6* 102.5* 102.1*  PLT 141* 124* 135* 141* 173    Cardiac Enzymes: No results for input(s): CKTOTAL, CKMB, CKMBINDEX, TROPONINI in the last 168 hours.  BNP: Invalid input(s): POCBNP  CBG: Recent Labs  Lab 11/15/20 1605 11/15/20 2014 11/15/20 2320 11/16/20 0308 11/16/20 0737  GLUCAP 92 97 115* 115* 97    Microbiology: Results for orders placed or performed during the hospital encounter of 10/20/20  Urine culture     Status: None   Collection Time: 10/20/20  8:38 PM   Specimen: In/Out Cath Urine  Result Value Ref Range Status   Specimen Description   Final    IN/OUT CATH URINE Performed at [facility], [address] [place], [place] [address]    Special Requests   Final    NONE Performed at [facility], [address] [place], [place] [address]    Culture   Final    NO GROWTH Performed at [facility], [place] [address], [place], [place] [address]    Report Status 10/22/2020 FINAL  Final  Resp Panel by RT-PCR (Flu A&B, Covid) Nasopharyngeal Swab     Status: Abnormal   Collection Time: 10/20/20  8:42 PM   Specimen: Nasopharyngeal Swab; Nasopharyngeal(NP) swabs in vial transport medium  Result Value Ref Range Status   SARS Coronavirus 2 by RT PCR POSITIVE (A) NEGATIVE Final    Comment: RESULT CALLED TO, READ BACK BY AND VERIFIED WITH: G PRUITT,RN_0  10/20/20 MKELLY (NOTE) SARS-CoV-2 target nucleic acids are DETECTED.  The SARS-CoV-2 RNA is generally detectable in upper respiratory specimens during the acute phase of infection. Positive results are indicative of the presence of the identified virus, but do not rule out bacterial infection or co-infection with other pathogens not detected by the test. Clinical correlation with patient history and other diagnostic information is necessary to determine patient infection status. The expected result is Negative.  Fact Sheet for Patients: EntrepreneurPulse.com.au  Fact Sheet for Healthcare Providers: IncredibleEmployment.be  This test is not yet approved or cleared by the [place] FDA and  has been authorized for detection and/or diagnosis of SARS-CoV-2 by FDA under an Emergency Use Authorization (EUA).  This EUA will remain in effect (meaning this test can be  used) for the duration of  the COVID-19 declaration under Section 564(b)(1) of the Act, 21 U.S.C. section 360bbb-3(b)(1), unless the authorization is terminated or revoked sooner.     Influenza A by PCR NEGATIVE NEGATIVE Final   Influenza B by PCR NEGATIVE NEGATIVE Final    Comment: (NOTE) The Xpert Xpress SARS-CoV-2/FLU/RSV plus assay is intended as an aid in the diagnosis of influenza from Nasopharyngeal swab specimens  and should not be used as a sole basis for treatment. Nasal washings and aspirates are unacceptable for Xpert Xpress SARS-CoV-2/FLU/RSV testing.  Fact Sheet for Patients: EntrepreneurPulse.com.au  Fact Sheet for Healthcare Providers:  IncredibleEmployment.be  This test is not yet approved or cleared by the [place] and has been authorized for detection and/or diagnosis of SARS-CoV-2 by FDA under an Emergency Use Authorization (EUA). This EUA will remain in effect (meaning this test can be used) for the duration of the COVID-19 declaration under Section 564(b)(1) of the Act, 21 U.S.C. section 360bbb-3(b)(1), unless the authorization is terminated or revoked.  Performed at [facility], [address] [place], [place] [address]   Blood Culture (routine x 2)     Status: None   Collection Time: 10/20/20  8:42 PM   Specimen: BLOOD RIGHT ARM  Result Value Ref Range Status   Specimen Description BLOOD RIGHT ARM  Final   Special Requests   Final    BOTTLES DRAWN AEROBIC AND ANAEROBIC Blood Culture adequate volume   Culture   Final    NO GROWTH 5 DAYS Performed at [facility], [address], [place], [place] [address]    Report Status 10/25/2020 FINAL  Final  Blood Culture (routine x 2)     Status: None   Collection Time: 10/20/20 11:14 PM   Specimen: BLOOD RIGHT ARM  Result Value Ref Range Status   Specimen Description BLOOD RIGHT ARM  Final   Special Requests   Final    BOTTLES DRAWN AEROBIC AND ANAEROBIC Blood Culture adequate volume   Culture   Final    NO GROWTH 5 DAYS Performed at [facility], [address], [place], [place] [address]    Report Status 10/25/2020 FINAL  Final  MRSA PCR Screening     Status: None   Collection Time: 10/21/20  5:42 AM   Specimen: Nasal Mucosa; Nasopharyngeal  Result Value Ref Range Status   MRSA by PCR NEGATIVE NEGATIVE Final    Comment:        The GeneXpert MRSA Assay (FDA approved for NASAL  specimens only), is one component of a comprehensive MRSA colonization surveillance program. It is not intended to diagnose MRSA infection nor to guide or monitor treatment for MRSA infections. Performed at [facility], [place] [address] [place], [place] [address]   Culture, respiratory (non-expectorated)     Status: None   Collection Time: 10/27/20  9:06 AM   Specimen: Tracheal Aspirate; Respiratory  Result Value Ref Range Status   Specimen Description   Final    TRACHEAL ASPIRATE Performed at [place] [address] [place], [place] [address]    Special Requests   Final    NONE Performed at [facility], [place] [address], [place], [place] [address]    Gram Stain   Final    NO WBC SEEN MODERATE GRAM VARIABLE ROD FEW GRAM POSITIVE COCCI IN PAIRS Performed at [facility], [place] [address] [place], [place] [address]    Culture ABUNDANT STENOTROPHOMONAS MALTOPHILIA  Final   Report Status 10/31/2020 FINAL  Final   Organism ID, Bacteria STENOTROPHOMONAS MALTOPHILIA  Final      Susceptibility   Stenotrophomonas maltophilia - MIC*    LEVOFLOXACIN 1 SENSITIVE Sensitive     TRIMETH/SULFA <=20 SENSITIVE Sensitive     * ABUNDANT STENOTROPHOMONAS MALTOPHILIA  MRSA PCR Screening     Status: None   Collection Time: 10/28/20 12:07 PM   Specimen: Nasal Mucosa; Nasopharyngeal  Result Value Ref Range Status   MRSA by PCR NEGATIVE NEGATIVE Final    Comment:        The GeneXpert MRSA Assay (FDA approved  for NASAL specimens only), is one component of a comprehensive MRSA colonization surveillance program. It is not intended to diagnose MRSA infection nor to guide or monitor treatment for MRSA infections. Performed at [facility], [place] [address], [place], [place] [address]   Culture, respiratory (non-expectorated)     Status: None   Collection Time: 11/02/20  7:32 AM   Specimen: Tracheal Aspirate;  Respiratory  Result Value Ref Range Status   Specimen Description   Final    TRACHEAL ASPIRATE Performed at [place] [address], [place], [place] [address]    Special Requests   Final    NONE Performed at [facility], [place] [address], [place], [place] [address]    Gram Stain   Final    ABUNDANT WBC PRESENT, PREDOMINANTLY PMN MODERATE GRAM NEGATIVE RODS RARE GRAM POSITIVE COCCI Performed at [facility], [place] [address], [place], [place] [address]    Culture MODERATE STENOTROPHOMONAS MALTOPHILIA  Final   Report Status 11/05/2020 FINAL  Final   Organism ID, Bacteria STENOTROPHOMONAS MALTOPHILIA  Final      Susceptibility   Stenotrophomonas maltophilia - MIC*    LEVOFLOXACIN 1 SENSITIVE Sensitive     TRIMETH/SULFA <=20 SENSITIVE Sensitive     * MODERATE STENOTROPHOMONAS MALTOPHILIA  Culture, blood (routine x 2)     Status: None   Collection Time: 11/02/20  8:14 AM   Specimen: BLOOD  Result Value Ref Range Status   Specimen Description   Final    BLOOD LEFT ANTECUBITAL Performed at [place] [address] [place], [place] [address]    Special Requests   Final    BOTTLES DRAWN AEROBIC ONLY Blood Culture adequate volume Performed at [place] [address] [place], [place] [address]    Culture   Final    NO GROWTH 6 DAYS Performed at [facility], [place] [address], [place], [place] [address]    Report Status 11/08/2020 FINAL  Final  Culture, blood (routine x 2)     Status: None   Collection Time: 11/02/20  8:14 AM   Specimen: BLOOD  Result Value Ref Range Status   Specimen Description   Final    BLOOD BLOOD LEFT FOREARM Performed at [place] [address] [place], [place] [address]    Special Requests   Final    BOTTLES DRAWN AEROBIC ONLY Blood Culture adequate volume Performed at [place] [address]  [place], [place] [address]    Culture   Final    NO GROWTH 6 DAYS Performed at [facility], [place] [address] [place], [place] [address]    Report Status 11/08/2020 FINAL  Final  MRSA PCR Screening     Status: None   Collection Time: 11/02/20  9:56 AM   Specimen: Nasopharyngeal  Result Value Ref Range Status   MRSA by PCR NEGATIVE NEGATIVE Final    Comment:        The GeneXpert MRSA Assay (FDA approved for NASAL specimens only), is one component of a comprehensive MRSA colonization surveillance program. It is not intended to diagnose MRSA infection nor to guide or monitor treatment for MRSA infections. Performed at [facility], [place] [address], [place],  [address]   Culture, Urine     Status: None   Collection Time: 11/02/20 10:54 AM   Specimen: Urine, Random  Result Value Ref Range Status  Specimen Description   Final    URINE, RANDOM Performed at [facility], [place] [address] [place], [place] [address]    Special Requests   Final    NONE Performed at [facility], [place] [address] [place], [place] [address]    Culture   Final    NO GROWTH Performed at [facility], [place] [address], [place], [place] [address]    Report Status 11/04/2020 FINAL  Final  Culture, respiratory (non-expectorated)     Status: None (Preliminary result)   Collection Time: 11/10/20 12:01 PM   Specimen: Tracheal Aspirate; Respiratory  Result Value Ref Range Status   Specimen Description   Final    TRACHEAL ASPIRATE Performed at [place] [address] [place], [place] [address]    Special Requests   Final    NONE Performed at [facility], [place] [address], [place], [place] [address]    Gram Stain   Final    MODERATE WBC PRESENT,BOTH PMN AND MONONUCLEAR MODERATE GRAM NEGATIVE RODS    Culture   Final    MODERATE STENOTROPHOMONAS MALTOPHILIA MULTI-DRUG RESISTANT  ORGANISM CRITICAL RESULT CALLED TO, READ BACK BY AND VERIFIED WITH: [place] [id number] AT 46 AM BY CM Sent to [place] for further susceptibility testing. Performed at [facility], [place] [address] [place], [place] [address]    Report Status PENDING  Incomplete   Organism ID, Bacteria STENOTROPHOMONAS MALTOPHILIA  Final      Susceptibility   Stenotrophomonas maltophilia - MIC*    LEVOFLOXACIN >=8 RESISTANT Resistant     TRIMETH/SULFA 80 RESISTANT Resistant     * MODERATE STENOTROPHOMONAS MALTOPHILIA  Susceptibility, Aer + Anaerob     Status: Abnormal   Collection Time: 11/10/20  4:28 PM  Result Value Ref Range Status   Suscept, Aer + Anaerob Preliminary report (A)  Final    Comment: (NOTE) Performed At: [facility] [place], [place] [id number] Rush Farmer MD [id number]    Source of Sample SUSCEPTIBILITIES TO FOLLOW  Final    Comment: Performed at [facility], [place] [address], [place], [place] [address]  Susceptibility Result     Status: Abnormal   Collection Time: 11/10/20  4:28 PM  Result Value Ref Range Status   Suscept Result 1 Comment (A)  Final    Comment: (NOTE) Stenotrophomonas maltophilia Identification performed by account, not confirmed by this laboratory. Performed At: [facility] [place], [place] [id number] Rush Farmer MD [id number]   Surgical pcr screen     Status: None   Collection Time: 11/14/20  2:06 AM   Specimen: Nasal Mucosa; Nasal Swab  Result Value Ref Range Status   MRSA, PCR NEGATIVE NEGATIVE Final   Staphylococcus aureus NEGATIVE NEGATIVE Final    Comment: (NOTE) The Xpert SA Assay (FDA approved for NASAL specimens in patients 35 years of age and older), is one component of a comprehensive surveillance program. It is not intended to diagnose infection nor to guide or monitor treatment. Performed at [facility], [place] [address] [place], [place] [address]      Coagulation Studies: No results for input(s): LABPROT, INR in the last 72 hours.  Urinalysis: No results for input(s): COLORURINE, LABSPEC, PHURINE, GLUCOSEU, HGBUR, BILIRUBINUR, KETONESUR, PROTEINUR, UROBILINOGEN, NITRITE, LEUKOCYTESUR in the last 72 hours.  Invalid input(s): APPERANCEUR    Imaging: DG Abd 1 View  Result Date: 11/14/2020 CLINICAL DATA:  Feeding  tube placement EXAM: ABDOMEN - 1 VIEW COMPARISON:  None. FINDINGS: Soft feeding tube tip is at the gastroesophageal junction. Bowel gas pattern unremarkable. IMPRESSION: Soft feeding tube tip at the gastroesophageal junction. Electronically Signed   By: Nelson Chimes M.D.   On: 11/14/2020 14:24   DG Naso G Tube Plc W/Fl W/Rad  Result Date: 11/15/2020 CLINICAL DATA:  Feeding tube placement EXAM: NASO G TUBE PLACEMENT WITH FL AND WITH RAD CONTRAST:  No contrast used FLUOROSCOPY TIME:  Fluoroscopy Time:  4.4 minutes Radiation Exposure Index (if provided by the fluoroscopic device): 110 mGy Number of Acquired Spot Images: 1 COMPARISON:  Radiograph 01/16/2020 FINDINGS: Patient's LEFT nare was topically anesthetized with viscous lidocaine. Feeding tube was advanced into the stomach. Multiple attempts were made to advance into the proximal duodenum unsuccessfully. Amplatz wire was utilized without success. Multiple attempts resulted in tube coiling in patient's mouth. Feeding tube was ultimately positioned in the stomach. Stylet removed. IMPRESSION: Feeding tube advanced to the stomach.  Stylet removed. Unsuccessful attempts to advance into duodenum. Electronically Signed   By: Suzy Bouchard M.D.   On: 11/15/2020 09:08     Medications:   .  prismasol BGK 4/2.5 400 mL/hr at 11/16/20 0347  .  prismasol BGK 4/2.5 200 mL/hr at 11/15/20 2350  . sodium chloride 10 mL/hr at 11/16/20 1100  . dexmedetomidine (PRECEDEX) IV infusion 0.5 mcg/kg/hr (11/16/20 1100)  . famotidine (PEPCID) IV Stopped (11/15/20 2155)  . feeding supplement (PIVOT  1.5 CAL) 1,000 mL (11/15/20 1351)  . heparin 10,000 units/ 20 mL infusion syringe 1,000 Units/hr (11/16/20 0549)  . prismasol BGK 4/2.5 1,800 mL/hr at 11/16/20 0932   . albuterol  2.5 mg Nebulization QID  . chlorhexidine gluconate (MEDLINE KIT)  15 mL Mouth Rinse BID  . Chlorhexidine Gluconate Cloth  6 each Topical Q0600  . cloNIDine  0.3 mg Transdermal Weekly  . collagenase   Topical Daily  . fentaNYL  1 patch Transdermal Q72H  . heparin injection (subcutaneous)  5,000 Units Subcutaneous Q8H  . linagliptin  5 mg Per Tube Daily  . mouth rinse  15 mL Mouth Rinse 10 times per day  . methylPREDNISolone (SOLU-MEDROL) injection  40 mg Intravenous Q12H  . metoCLOPramide (REGLAN) injection  10 mg Intravenous Q6H  . minocycline  100 mg Per Tube Q12H  . oxyCODONE  5 mg Oral Q8H  . sodium chloride flush  10-40 mL Intracatheter Q12H   sodium chloride, heparin, heparin, heparin, HYDROmorphone (DILAUDID) injection, iohexol, lip balm, sodium chloride, sodium chloride flush, Thrombi-Pad  Assessment/ Plan:  1. AKI - acute kidney injury, nonoliguric w/ rising creat / BUN due to shock +/- hemodynamics/ +/-COVID-19 effects. [place] w/o hydro, normal appearing kidneys. UA +hematuria/ pyuria. Creat 1.1 on admit, worsening up to 5.4 w/ BUN 178 so pt was started on CRRT 12/16.  Patient is requiring CRRT anuric renal failure will need transition to intermittent hemodialysis 2. BP/ volume -appears to be being kept even on CRRT 3. COVID pna/ ARDS - per CCM, mult infiltrates on CXR. As above. S/p trach 12/23.  Goal per PCCM is to transition to PO pain meds to wake up a little more which is doing already 4. MDR Stenotrophomonas: ID back on board, getting antibiotics (minocycline).   5. Diarrhea - rectal tube 6. Slow gastric transit: has [place] 7. Dispo: remains in ICU     LOS: [place] _0 _1 :26 AM

## 2020-11-16 NOTE — Progress Notes (Signed)
NAME:  Jayceeon Shrieves, MRN:  [id number], DOB:  Feb 24, 1961, LOS: 67 ADMISSION DATE:  10/20/2020, CONSULTATION DATE:  11/29 REFERRING MD:  Sabra Heck, CHIEF COMPLAINT:  ARDS COVID 32   Brief History   59 year old male with no significant past medical history and a positive COVID-19 test on 11/26 presented to [facility] with profound hypoxemia on 11/28.  Intubated immediately upon arrival found to be in ARDS. Transferred to [facility] long hospital for ongoing ICU care in the early a.m. hours of 11/29.  Past Medical History  Obesity  Significant Hospital Events   11/28 Presented to [facility] ER, intubated for hypoxemia, empiric abx, shock on levo. COVID Rx 11/29 Persistent shock, central line placed, ARDS protocol continued, proning initiated 11/30 Pressor requirements continued, NMB initiated to facilitate ventilation tube feeds started, initiated proning 12/01 Stopping NMB, proning positioning protocol discontinued, free water for rising creatinine and sodium 12/02 PEEP trending down.  Renal function improved. Changed to versed from prop gtt d/t rising triglycerides  12/03 Remdesivir completed. Peep/FIO2 stable. Free water adjusted. Solumedrol reduced, fentanyl to Dilaudid, adding Oxycodone  12/04 Dyssynchrony, given paralytic overnight, changed to ketamine; vomited / aspirated, restarted paralytics 12/06 Proned, under NMB 12/07 Proning, NMB continued. Switched antibiotics for stenotrophomonas 12/13 Stopped oxycodone, added dilaudid oral, continuous nimbex infusion to prn, palliative care consulted 12/14 Changed to pressure control 12/15 Worsening vent synchrony after clamping OG tube 12/18 added reglan IV for ? Ileus developing  12/18 back on paralytics for "guppy breathing/ air stacking" on PCV ? ,mucus plug  12/19  Back on PCV and off paralytics  12/20 having intermittent fevers, atrial fibrillation with some ST changes.  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 Started on minocycline per ID 12/22 stable decreased steroid dosing. Clonazepam added 12/23 trach placed. Versed off. Changed dilaudid from 4mg -.3mg , added clonidine and increased depakote. Added midodrine. OGT out during surg. Post-pyloric dislodged. Tip in stomach. Attempting trickle feeds w/ reglan  Consults:   Nephrology 12/14 Palliative 12/13 ID signed off 12/17  Procedures:   ETT 11/28 >>trach 12/23 Bates CVL 11/29 >> 12/9 R PICC 12/9 >>  R IJ HD Cath 12/17 >>  Significant Diagnostic Tests:   ECHO 11/29 >> LVEF 50-55%, mild LVH, RV systolic function mildly reduced. Mild dilation of ascending aorta 85mm. LE Venous Duplex 12/8 >> negative for DVT   Micro Data:   COVID 11/28 >> Positive  Influenza A/B 11/28 >> negative  UC 11/28 >> negative  BCx2 11/28 >> negative  Tracheal aspirate 12/5 >> Stenotrophomonas maltophilia >> S-bactrim, levofloxacin UC 12/11 >> negative  Tracheal aspirate 12/11 >> Stenotrophomonas maltophilia >> S-bactrim, levofloxacin  BCx2 12/11 >> negative  Trach 12/ 19 >>> multidrug resistance stenotrophomonas  Antimicrobials/covid rx:   CTX 11/28 > 11/29 Azithromycin 11/28 > 11/29 Remdesivir 11/28 > 12/2 Prednisone 11/28 >  Baricitinib 12/4 > 12/5 Vanco 12/5 >> 12/7, 12/11 x1 Cefepime 12/5 >> 12/7, 12/11 >> 12/13 Levaquin 12/7 >> 12/16 Minocycline 12/21>>>   Interim history/subjective:  Waking up a bit more as sedation is weaned.  Working on going down for fluoro-guided NGT, remains on full vent support.  Objective   Blood pressure 125/78, pulse (!) 102, temperature (!) 97.1 F (36.2 C), temperature source Axillary, resp. rate (!) 24, height 6' (1.829 m), weight 109.9 kg, SpO2 99 %.    Vent Mode: PCV FiO2 (%):  [  40 %] 40 % Set Rate:  [15 bmp] 15  bmp PEEP:  [5 cmH20] 5 cmH20 Pressure Support:  [10 cmH20] 10 cmH20   Intake/Output Summary (Last 24 hours) at 11/16/2020 5638 Last data filed at 11/16/2020 0900 Gross per 24 hour  Intake 975.23 ml  Output 1998 ml  Net -1022.77 ml   Filed Weights   11/14/20 0239 11/15/20 0500 11/16/20 0500  Weight: 116.3 kg 112.7 kg 109.9 kg    Examination:  Constitutional: Chronically ill-appearing, on vent, sedated on Dilaudid drip Eyes: pupils pinpoint but reactive, not tracking Ears, nose, mouth, and throat: trach in place, dried blood around Cardiovascular: RRR, ext warm Respiratory: Mild accessory muscle use, diffuse scattered rhonchi Gastrointestinal: Soft, hypoactive BS Skin: No rashes, normal turgor Neurologic: RASS -3 does not follow commands Psychiatric: cannot assess  Labs show normal electrolytes, decreasing creatinine, increasing leukocytosis , improving thrombocytopenia  Resolved Hospital Problem list     Assessment & Plan:   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  Trach placed 12/23 by ENT -Tolerating pressure control of 8 , but very asynchronous on pressure support today -  Acute metabolic encephalopathy- related to prolonged critical illness, various metabolic perturbations, and prolonged use of sedatives. -Now that enteral access reestablished, start oxycodone and DC Dilaudid drip -Use Precedex if severe asynchrony (was on valproic acid, clonazepam)  Stenotrophomonas pneumonia vs. tracheitis - Minocycline with 7-14 day therapy planned per ID, reassess need for longer duration on day 7 -WBC rise concerning, follow  Ileus- postpyloric dobhoff placed by IR  -Restart TF, continue reglan  Post trach bleeding, anemia- seems to have eased up, monitor CBC  Multifactorial ATN- CRRT per nephrology  Best practice (evaluated daily)  Diet: TF once tube back  in Pain/Anxiety/Delirium protocol (if indicated): as above VAP protocol (if indicated): yes DVT prophylaxis: heparin sq  GI prophylaxis: famotidine Glucose control: off Mobility: bed rest Last date of multidisciplinary goals of care discussion 12/21 with sister.   Family and staff present n/a Summary of discussion n/a Follow up goals of care discussion due: 12/29; family still wants full scope of care  Code Status: full code  Disposition: ICU   Patient critically ill due to respiratory failure, failure to wean, AKI, infection Interventions to address this today-vent weaning, changing sedation Risk of deterioration without these interventions is high  I personally spent 31 minutes providing critical care not including any separately billable procedures   Kara Mead MD. FCCP. [facility] See Amion for pager  If no response to pager , please call [phone]  After 7:00 pm call Elink  [phone]    11/16/2020 9:58 AM

## 2020-11-16 NOTE — Plan of Care (Signed)
Pt more alert but not following commands. Stopped Dilaudid drip and Precedex started with scheduled Oxycodone.  Weaned during shift and tolerated well, some belly breathing noted. Tolerating tube feedings with no residuals at this time. Plan to stop CRRT on 12/26 and start transitioning patient for HD and transfer to [facility] Problem: Education: Goal: Knowledge of General Education information will improve Description: Including pain rating scale, medication(s)/side effects and non-pharmacologic comfort measures Outcome: Progressing   Problem: Health Behavior/Discharge Planning: Goal: Ability to manage health-related needs will improve Outcome: Progressing   Problem: Clinical Measurements: Goal: Ability to maintain clinical measurements within normal limits will improve Outcome: Progressing Goal: Will remain free from infection Outcome: Progressing Goal: Diagnostic test results will improve Outcome: Progressing Goal: Respiratory complications will improve Outcome: Progressing Goal: Cardiovascular complication will be avoided Outcome: Progressing   Problem: Activity: Goal: Risk for activity intolerance will decrease Outcome: Progressing   Problem: Nutrition: Goal: Adequate nutrition will be maintained Outcome: Progressing   Problem: Coping: Goal: Level of anxiety will decrease Outcome: Progressing   Problem: Elimination: Goal: Will not experience complications related to bowel motility Outcome: Progressing Goal: Will not experience complications related to urinary retention Outcome: Progressing   Problem: Pain Managment: Goal: General experience of comfort will improve Outcome: Progressing   Problem: Safety: Goal: Ability to remain free from injury will improve Outcome: Progressing   Problem: Skin Integrity: Goal: Risk for impaired skin integrity will decrease Outcome: Progressing   Problem: Education: Goal: Knowledge of risk factors and measures for prevention  of condition will improve Outcome: Progressing   Problem: Coping: Goal: Psychosocial and spiritual needs will be supported Outcome: Progressing   Problem: Respiratory: Goal: Will maintain a patent airway Outcome: Progressing Goal: Complications related to the disease process, condition or treatment will be avoided or minimized Outcome: Progressing   Problem: Education: Goal: Knowledge of risk factors and measures for prevention of condition will improve Outcome: Progressing   Problem: Coping: Goal: Psychosocial and spiritual needs will be supported Outcome: Progressing   Problem: Respiratory: Goal: Will maintain a patent airway Outcome: Progressing Goal: Complications related to the disease process, condition or treatment will be avoided or minimized Outcome: Progressing   Problem: Education: Goal: Knowledge of disease and its progression will improve Outcome: Progressing   Problem: Health Behavior/Discharge Planning: Goal: Ability to manage health-related needs will improve Outcome: Progressing   Problem: Clinical Measurements: Goal: Complications related to the disease process or treatment will be avoided or minimized Outcome: Progressing Goal: Dialysis access will remain free of complications Outcome: Progressing   Problem: Activity: Goal: Activity intolerance will improve Outcome: Progressing   Problem: Fluid Volume: Goal: Fluid volume balance will be maintained or improved Outcome: Progressing   Problem: Nutritional: Goal: Ability to make appropriate dietary choices will improve Outcome: Progressing   Problem: Respiratory: Goal: Respiratory symptoms related to disease process will be avoided Outcome: Progressing   Problem: Self-Concept: Goal: Body image disturbance will be avoided or minimized Outcome: Progressing   Problem: Urinary Elimination: Goal: Progression of disease will be identified and treated Outcome: Progressing   Problem:  Education: Goal: Knowledge about tracheostomy care/management will improve Outcome: Progressing   Problem: Health Behavior/Discharge Planning: Goal: Ability to manage tracheostomy will improve Outcome: Progressing   Problem: Respiratory: Goal: Patent airway maintenance will improve Outcome: Progressing   Problem: Role Relationship: Goal: Ability to communicate will improve Outcome: Progressing

## 2020-11-16 NOTE — Progress Notes (Signed)
Wasted IV Dilaudid 162ml witnessed by [facility] RN in steri cycle per protocol

## 2020-11-16 NOTE — Progress Notes (Signed)
1530 RT placed pt on 40% ATC. SATS 100%, HR 80, RR 27. Pt's breathing seems to be better. RT will continue to monitor

## 2020-11-16 NOTE — Progress Notes (Signed)
No gastric residual from 1900-0700

## 2020-11-17 ENCOUNTER — Inpatient Hospital Stay (HOSPITAL_COMMUNITY): Payer: BC Managed Care – PPO

## 2020-11-17 LAB — APTT: aPTT: 38 seconds — ABNORMAL HIGH (ref 24–36)

## 2020-11-17 LAB — BLOOD GAS, ARTERIAL
Acid-Base Excess: 2 mmol/L (ref 0.0–2.0)
Bicarbonate: 25.1 mmol/L (ref 20.0–28.0)
FIO2: 40
O2 Saturation: 93.8 %
Patient temperature: 98.6
pCO2 arterial: 35.7 mmHg (ref 32.0–48.0)
pH, Arterial: 7.461 — ABNORMAL HIGH (ref 7.350–7.450)
pO2, Arterial: 70.8 mmHg — ABNORMAL LOW (ref 83.0–108.0)

## 2020-11-17 LAB — RENAL FUNCTION PANEL
Albumin: 2.3 g/dL — ABNORMAL LOW (ref 3.5–5.0)
Albumin: 2.6 g/dL — ABNORMAL LOW (ref 3.5–5.0)
Anion gap: 10 (ref 5–15)
Anion gap: 9 (ref 5–15)
BUN: 45 mg/dL — ABNORMAL HIGH (ref 6–20)
BUN: 46 mg/dL — ABNORMAL HIGH (ref 6–20)
CO2: 25 mmol/L (ref 22–32)
CO2: 25 mmol/L (ref 22–32)
Calcium: 8.5 mg/dL — ABNORMAL LOW (ref 8.9–10.3)
Calcium: 8.9 mg/dL (ref 8.9–10.3)
Chloride: 102 mmol/L (ref 98–111)
Chloride: 102 mmol/L (ref 98–111)
Creatinine, Ser: 1.57 mg/dL — ABNORMAL HIGH (ref 0.61–1.24)
Creatinine, Ser: 1.57 mg/dL — ABNORMAL HIGH (ref 0.61–1.24)
GFR, Estimated: 51 mL/min — ABNORMAL LOW (ref >60)
GFR, Estimated: 51 mL/min — ABNORMAL LOW (ref >60)
Glucose, Bld: 162 mg/dL — ABNORMAL HIGH (ref 70–99)
Glucose, Bld: 110 mg/dL — ABNORMAL HIGH (ref 70–99)
Phosphorus: 3.4 mg/dL (ref 2.5–4.6)
Phosphorus: 3.1 mg/dL (ref 2.5–4.6)
Potassium: 4.5 mmol/L (ref 3.5–5.1)
Potassium: 5.2 mmol/L — ABNORMAL HIGH (ref 3.5–5.1)
Sodium: 137 mmol/L (ref 135–145)
Sodium: 136 mmol/L (ref 135–145)

## 2020-11-17 LAB — CBC WITH DIFFERENTIAL/PLATELET
Abs Immature Granulocytes: 1.09 10*3/uL — ABNORMAL HIGH (ref 0.00–0.07)
Basophils Absolute: 0.1 10*3/uL (ref 0.0–0.1)
Basophils Relative: 1 %
Eosinophils Absolute: 0.1 10*3/uL (ref 0.0–0.5)
Eosinophils Relative: 1 %
HCT: 27.6 % — ABNORMAL LOW (ref 39.0–52.0)
Hemoglobin: 8.6 g/dL — ABNORMAL LOW (ref 13.0–17.0)
Immature Granulocytes: 7 %
Lymphocytes Relative: 7 %
Lymphs Abs: 1 10*3/uL (ref 0.7–4.0)
MCH: 32.2 pg (ref 26.0–34.0)
MCHC: 31.2 g/dL (ref 30.0–36.0)
MCV: 103.4 fL — ABNORMAL HIGH (ref 80.0–100.0)
Monocytes Absolute: 1.1 10*3/uL — ABNORMAL HIGH (ref 0.1–1.0)
Monocytes Relative: 7 %
Neutro Abs: 11.4 10*3/uL — ABNORMAL HIGH (ref 1.7–7.7)
Neutrophils Relative %: 77 %
Platelets: 90 10*3/uL — ABNORMAL LOW (ref 150–400)
RBC: 2.67 MIL/uL — ABNORMAL LOW (ref 4.22–5.81)
RDW: 14.6 % (ref 11.5–15.5)
WBC: 14.8 10*3/uL — ABNORMAL HIGH (ref 4.0–10.5)
nRBC: 0 % (ref 0.0–0.2)

## 2020-11-17 LAB — GLUCOSE, CAPILLARY
Glucose-Capillary: 112 mg/dL — ABNORMAL HIGH (ref 70–99)
Glucose-Capillary: 118 mg/dL — ABNORMAL HIGH (ref 70–99)
Glucose-Capillary: 134 mg/dL — ABNORMAL HIGH (ref 70–99)
Glucose-Capillary: 136 mg/dL — ABNORMAL HIGH (ref 70–99)
Glucose-Capillary: 150 mg/dL — ABNORMAL HIGH (ref 70–99)
Glucose-Capillary: 93 mg/dL (ref 70–99)

## 2020-11-17 LAB — MAGNESIUM: Magnesium: 2.8 mg/dL — ABNORMAL HIGH (ref 1.7–2.4)

## 2020-11-17 MED ORDER — OXYCODONE HCL 5 MG PO TABS
5.0000 mg | ORAL_TABLET | Freq: Two times a day (BID) | ORAL | Status: AC
  Administered 2020-11-17 – 2020-11-18 (×2): 5 mg via ORAL
  Filled 2020-11-17 (×2): qty 1

## 2020-11-17 MED ORDER — SODIUM CHLORIDE 0.9% FLUSH: 3.0000 mL | INTRAVENOUS | Status: DC | PRN

## 2020-11-17 MED ORDER — SODIUM CHLORIDE 0.9 % IV SOLN
250.0000 mL | INTRAVENOUS | Status: DC | PRN
  Administered 2020-11-18: 23:00:00 250 mL via INTRAVENOUS

## 2020-11-17 MED ORDER — SODIUM CHLORIDE 0.9% FLUSH
3.0000 mL | Freq: Two times a day (BID) | INTRAVENOUS | Status: DC
  Administered 2020-11-17 – 2020-11-19 (×5): 3 mL via INTRAVENOUS

## 2020-11-17 MED ORDER — CHLORHEXIDINE GLUCONATE 0.12 % MT SOLN
OROMUCOSAL | Status: AC
  Administered 2020-11-17: 20:00:00 15 mL via OROMUCOSAL
  Filled 2020-11-17: qty 15

## 2020-11-17 NOTE — Progress Notes (Signed)
NAME:  [facility] Fig[facility], MRN:  [id number], DOB:  24-Feb-1961, LOS: 17 ADMISSION DATE:  10/20/2020, CONSULTATION DATE:  11/29 REFERRING MD:  Sabra Heck, CHIEF COMPLAINT:  ARDS COVID 86   Brief History   59 year old male with no significant past medical history and a positive COVID-19 test on 11/26 presented to [facility] with profound hypoxemia on 11/28.  Intubated immediately upon arrival found to be in ARDS. Transferred to [facility] long hospital for ongoing ICU care  11/29.  Past Medical History  Obesity  Significant Hospital Events   11/28 Presented to [facility] ER, intubated for hypoxemia, empiric abx, shock on levo. COVID Rx 11/29 Persistent shock, central line placed, ARDS protocol continued, proning initiated 11/30 Pressor requirements continued, NMB initiated to facilitate ventilation tube feeds started, initiated proning 12/01 Stopping NMB, proning positioning protocol discontinued, free water for rising creatinine and sodium 12/02 PEEP trending down.  Renal function improved. Changed to versed from prop gtt d/t rising triglycerides  12/03 Remdesivir completed. Peep/FIO2 stable. Free water adjusted. Solumedrol reduced, fentanyl to Dilaudid, adding Oxycodone  12/04 Dyssynchrony, given paralytic overnight, changed to ketamine; vomited / aspirated, restarted paralytics 12/06 Proned, under NMB 12/07 Proning, NMB continued. Switched antibiotics for stenotrophomonas 12/13 Stopped oxycodone, added dilaudid oral, continuous nimbex infusion to prn, palliative care consulted 12/14 Changed to pressure control 12/15 Worsening vent synchrony after clamping OG tube 12/18 added reglan IV for ? Ileus developing  12/18 back on paralytics for "guppy breathing/ air stacking" on PCV ? ,mucus plug  12/19  Back on PCV and off paralytics  12/20 having intermittent fevers, atrial fibrillation with some ST changes.  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 Started on minocycline per ID 12/22 stable decreased steroid dosing. Clonazepam added 12/23 trach placed. Versed off. Changed dilaudid from 4mg -.3mg , added clonidine and increased depakote. Added midodrine. OGT out during surg. Post-pyloric dislodged. Tip in stomach. Attempting trickle feeds w/ reglan   12/24 NG tube placed by IR 12/25 Dilaudid weaned to off, tolerated trach collar Consults:   Nephrology 12/14 Palliative 12/13 ID signed off 12/17  Procedures:   ETT 11/28 >>trach 12/23 Bates CVL 11/29 >> 12/9 R PICC 12/9 >>  R IJ HD Cath 12/17 >>  Significant Diagnostic Tests:   ECHO 11/29 >> LVEF 50-55%, mild LVH, RV systolic function mildly reduced. Mild dilation of ascending aorta 61mm. LE Venous Duplex 12/8 >> negative for DVT   Micro Data:   COVID 11/28 >> Positive  Influenza A/B 11/28 >> negative  UC 11/28 >> negative  BCx2 11/28 >> negative  Tracheal aspirate 12/5 >> Stenotrophomonas maltophilia >> S-bactrim, levofloxacin UC 12/11 >> negative  Tracheal aspirate 12/11 >> Stenotrophomonas maltophilia >> S-bactrim, levofloxacin  BCx2 12/11 >> negative  Trach 12/ 19 >>> multidrug resistance stenotrophomonas  Antimicrobials/covid rx:   CTX 11/28 > 11/29 Azithromycin 11/28 > 11/29 Remdesivir 11/28 > 12/2 Prednisone 11/28 >  Baricitinib 12/4 > 12/5 Vanco 12/5 >> 12/7, 12/11 x1 Cefepime 12/5 >> 12/7, 12/11 >> 12/13 Levaquin 12/7 >> 12/16 Minocycline 12/21>>>   Interim history/subjective:   Tolerated trach collar for 4 hours yesterday. Rested on pressure control, this morning back on pressure support. Afebrile Remains critically ill, low-dose Precedex started, blood pressure soft this a.m.   Objective   Blood pressure (!) 91/53, pulse 72, temperature (!) 97.3 F (36.3 C), temperature source  Axillary, resp. rate Marland Kitchen)  24, height 6' (1.829 m), weight 109.9 kg, SpO2 95 %.    Vent Mode: CPAP;PSV FiO2 (%):  [35 %-40 %] 40 % Set Rate:  [15 bmp] 15 bmp PEEP:  [5 cmH20-8 cmH20] 8 cmH20 Pressure Support:  [10 cmH20] 10 cmH20 Plateau Pressure:  [11 cmH20-20 cmH20] 11 cmH20   Intake/Output Summary (Last 24 hours) at 11/17/2020 0941 Last data filed at 11/17/2020 0900 Gross per 24 hour  Intake 1510.15 ml  Output 2652 ml  Net -1141.85 ml   Filed Weights   11/14/20 0239 11/15/20 0500 11/16/20 0500  Weight: 116.3 kg 112.7 kg 109.9 kg    Examination:  Constitutional: Chronically ill-appearing, on vent,  Eyes: pupils pinpoint but reactive, not tracking Ears, nose, mouth, and throat: trach in place, dried blood around Cardiovascular: RRR, ext warm Respiratory: Mild abdominal breathing, scattered rhonchi bilateral, minimal secretions Gastrointestinal: Soft, hypoactive BS Skin: No rashes, normal turgor Neurologic: RASS - 2, does not follow commands, on low-dose Precedex drip Psychiatric: cannot assess  Labs show normal electrolytes, creatinine continues to drop, decrease leukocytosis, drop in platelets from 1 70-90 Resolved Hospital Problem list   Post trach bleeding  Assessment & Plan:   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  Trach placed 12/23 by ENT -Tolerated 4 hours of trach collar 12/25, hoping to get there again today, currently on pressure support [id number]  Acute metabolic encephalopathy- related to prolonged critical illness, various metabolic perturbations, and prolonged use of sedatives. -Continue fentanyl patch, taper oxycodone to off  -Use Precedex if severe asynchrony with goal RASS 0 to -1 (was on valproic acid, clonazepam)  Multifactorial ATN- CRRT per nephrology , can transition to intermittent dialysis in my opinion   MDR Stenotrophomonas pneumonia  vs. tracheitis - Minocycline with 7-14 day therapy planned per ID, reassess need for longer duration on day 7 -WBC coming back down  Ileus- postpyloric dobhoff placed by IR  -continue  TF, dc reglan   Thrombocytopenia -sudden drop from 1 70-90,?  Minocycline induced, will send HIT panel and will have to stop heparin, incl  on CRRT  Best practice (evaluated daily)  Diet: TF once tube back in Pain/Anxiety/Delirium protocol (if indicated): as above VAP protocol (if indicated): yes DVT prophylaxis: heparin sq  GI prophylaxis: famotidine Glucose control: off Mobility: bed rest Last date of multidisciplinary goals of care discussion 12/21 with sister.   Family and staff present n/a Summary of discussion n/a Follow up goals of care discussion due: 12/29; family still wants full scope of care  Code Status: full code  Disposition: ICU   Patient critically ill due to respiratory failure, failure to wean, AKI, infection , thrombocytopenia Interventions to address this today-vent weaning, changing sedation Risk of deterioration without these interventions is high  I personally spent 32 minutes providing critical care not including any separately billable procedures   Kara Mead MD. FCCP. [facility] See Amion for pager  If no response to pager , please call [phone]  After 7:00 pm call Elink  [phone]    11/17/2020 9:41 AM

## 2020-11-17 NOTE — Progress Notes (Signed)
Patient ID: [facility], male   DOB: May 31, 1961, 59 y.o.   MRN: [id number]  [facility] Progress Note    Assessment/ Plan:   1. Acute kidney Injury: Anuric overnight.  Etiology suspected to be from sepsis/shock associated with COVID-19 infection.  No evidence of obstruction seen on earlier imaging.  Continue CRRT at this time until the filter clots and then hold this modality with the aim of transitioning to intermittent dialysis given improvement of his hemodynamic status.  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 2.  COVID-19 pneumonia/ARDS: Remains ventilator dependent status post tracheostomy on 12/23.  Continue efforts at weaning from ventilator per CCM protocol. 3.  Multidrug resistant stenotrophomonas infection: On antibiotic coverage with minocycline. 4.  Thrombocytopenia: Initially suspected to be associated with sepsis/severe infection however, this continues to decrease even after improvement of infection parameters-currently ongoing evaluation for heparin-induced thrombocytopenia and systemic/CRRT heparin discontinued. 5.  Critical illness anemia: We will give Aranesp today and continue monitoring for overt losses.  Transfusion if hemoglobin <7.0.  Subjective:   Thrombocytopenia noted on labs earlier today.  Remains hemodynamically stable with intermittent tachycardia.   Objective:   BP 120/68 (BP Location: Left Arm)   Pulse (!) 105   Temp 99.4 F (37.4 C) (Axillary)   Resp (!) 37   Ht 6' (1.829 m)   Wt 109.9 kg   SpO2 96%   BMI 32.86 kg/m   Intake/Output Summary (Last 24 hours) at 11/17/2020 1302 Last data filed at 11/17/2020 1200 Gross per 24 hour  Intake 1240.34 ml  Output 2295 ml  Net -1054.66 ml   Weight change:   Physical Exam: Gen: Appears comfortable on ventilator via tracheostomy. CVS: Regular tachycardia, S1 and S2 normal Resp: Coarse breath sounds bilaterally, no distinct rales or rhonchi Abd: Soft, mildly distended,  nontender, bowel sounds normal Ext: With 1+ anasarca  Imaging: DG Chest Port 1 View  Result Date: 11/17/2020 CLINICAL DATA:  Acute respiratory failure. EXAM: PORTABLE CHEST 1 VIEW COMPARISON:  11/12/2020 FINDINGS: 0424 hours. Low lung volumes. The cardio pericardial silhouette is enlarged. Diffuse interstitial and patchy basilar airspace disease is similar to prior. Feeding tube tip is in the stomach. Tracheostomy tube is new in the interval. Right PICC line tip is probably in the region of the innominate vein confluence, but catheter is not well seen over the mediastinum. Right IJ central line tip projects over the expected location of the low SVC given the leftward patient rotation on this study. IMPRESSION: 1. Interval tracheostomy tube placement. 2. Low volume film with persistent diffuse interstitial and patchy basilar airspace disease. Electronically Signed   By: Misty Stanley M.D.   On: 11/17/2020 05:03    Labs: BMET Recent Labs  Lab 11/14/20 0436 11/14/20 1630 11/15/20 0315 11/15/20 1617 11/16/20 0352 11/16/20 1549 11/17/20 0430  NA 135 135 134* 136 136 134* 137  K 4.1 4.7 4.2 4.4 4.2 4.7 4.5  CL 99 100 100 102 100 101 102  CO2 _0 GLUCOSE 137* 126* 105* 102* 115* 123* 162*  BUN 62* 69* 54* 53* 48* 46* 45*  CREATININE 2.13* 2.18* 2.02* 1.94* 1.68* 1.59* 1.57*  CALCIUM 8.8* 8.6* 8.4* 8.4* 9.1 8.9 8.5*  PHOS 4.1 4.9* 3.5 3.7 3.0 3.4 3.4   CBC Recent Labs  Lab 11/13/20 0322 11/15/20 0315 11/16/20 0352 11/17/20 0430  WBC 17.5* 14.3* 19.8* 14.8*  NEUTROABS  --  11.1* 14.2* 11.4*  HGB 9.4* 7.8* 9.3* 8.6*  HCT 30.1* 24.5* 29.3* 27.6*  MCV 105.6* 102.5* 102.1* 103.4*  PLT 135* 141* 173 90*   Medications:    . albuterol  2.5 mg Nebulization QID  . chlorhexidine gluconate (MEDLINE KIT)  15 mL Mouth Rinse BID  . Chlorhexidine Gluconate Cloth  6 each Topical Q0600  . cloNIDine  0.3 mg Transdermal Weekly  . collagenase   Topical Daily  . fentaNYL  1  patch Transdermal Q72H  . linagliptin  5 mg Per Tube Daily  . mouth rinse  15 mL Mouth Rinse 10 times per day  . methylPREDNISolone (SOLU-MEDROL) injection  40 mg Intravenous Q12H  . minocycline  100 mg Per Tube Q12H  . oxyCODONE  5 mg Oral Q12H  . sodium chloride flush  10-40 mL Intracatheter Q12H  . sodium chloride flush  3 mL Intravenous Q12H   Elmarie Shiley, MD 11/17/2020, 1:02 PM

## 2020-11-18 ENCOUNTER — Encounter (HOSPITAL_COMMUNITY): Payer: Self-pay | Admitting: Otolaryngology

## 2020-11-18 DIAGNOSIS — Z8619 Personal history of other infectious and parasitic diseases: Secondary | ICD-10-CM

## 2020-11-18 DIAGNOSIS — K567 Ileus, unspecified: Secondary | ICD-10-CM

## 2020-11-18 LAB — CBC WITH DIFFERENTIAL/PLATELET
Abs Immature Granulocytes: 1.3 10*3/uL — ABNORMAL HIGH (ref 0.00–0.07)
Basophils Absolute: 0.1 10*3/uL (ref 0.0–0.1)
Basophils Relative: 0 %
Eosinophils Absolute: 0.2 10*3/uL (ref 0.0–0.5)
Eosinophils Relative: 1 %
HCT: 26.9 % — ABNORMAL LOW (ref 39.0–52.0)
Hemoglobin: 8.3 g/dL — ABNORMAL LOW (ref 13.0–17.0)
Immature Granulocytes: 7 %
Lymphocytes Relative: 8 %
Lymphs Abs: 1.6 10*3/uL (ref 0.7–4.0)
MCH: 31.9 pg (ref 26.0–34.0)
MCHC: 30.9 g/dL (ref 30.0–36.0)
MCV: 103.5 fL — ABNORMAL HIGH (ref 80.0–100.0)
Monocytes Absolute: 2 10*3/uL — ABNORMAL HIGH (ref 0.1–1.0)
Monocytes Relative: 10 %
Neutro Abs: 14.7 10*3/uL — ABNORMAL HIGH (ref 1.7–7.7)
Neutrophils Relative %: 74 %
Platelets: 111 10*3/uL — ABNORMAL LOW (ref 150–400)
RBC: 2.6 MIL/uL — ABNORMAL LOW (ref 4.22–5.81)
RDW: 14.8 % (ref 11.5–15.5)
WBC: 19.8 10*3/uL — ABNORMAL HIGH (ref 4.0–10.5)
nRBC: 0.1 % (ref 0.0–0.2)

## 2020-11-18 LAB — GLUCOSE, CAPILLARY
Glucose-Capillary: 105 mg/dL — ABNORMAL HIGH (ref 70–99)
Glucose-Capillary: 109 mg/dL — ABNORMAL HIGH (ref 70–99)
Glucose-Capillary: 121 mg/dL — ABNORMAL HIGH (ref 70–99)
Glucose-Capillary: 132 mg/dL — ABNORMAL HIGH (ref 70–99)
Glucose-Capillary: 134 mg/dL — ABNORMAL HIGH (ref 70–99)
Glucose-Capillary: 135 mg/dL — ABNORMAL HIGH (ref 70–99)
Glucose-Capillary: 142 mg/dL — ABNORMAL HIGH (ref 70–99)

## 2020-11-18 LAB — MAGNESIUM: Magnesium: 2.7 mg/dL — ABNORMAL HIGH (ref 1.7–2.4)

## 2020-11-18 LAB — RENAL FUNCTION PANEL
Albumin: 2.2 g/dL — ABNORMAL LOW (ref 3.5–5.0)
Albumin: 2.4 g/dL — ABNORMAL LOW (ref 3.5–5.0)
Anion gap: 11 (ref 5–15)
Anion gap: 10 (ref 5–15)
BUN: 64 mg/dL — ABNORMAL HIGH (ref 6–20)
BUN: 88 mg/dL — ABNORMAL HIGH (ref 6–20)
CO2: 22 mmol/L (ref 22–32)
CO2: 24 mmol/L (ref 22–32)
Calcium: 8.9 mg/dL (ref 8.9–10.3)
Calcium: 9.2 mg/dL (ref 8.9–10.3)
Chloride: 103 mmol/L (ref 98–111)
Chloride: 102 mmol/L (ref 98–111)
Creatinine, Ser: 2.31 mg/dL — ABNORMAL HIGH (ref 0.61–1.24)
Creatinine, Ser: 2.92 mg/dL — ABNORMAL HIGH (ref 0.61–1.24)
GFR, Estimated: 32 mL/min — ABNORMAL LOW (ref >60)
GFR, Estimated: 24 mL/min — ABNORMAL LOW (ref >60)
Glucose, Bld: 112 mg/dL — ABNORMAL HIGH (ref 70–99)
Glucose, Bld: 114 mg/dL — ABNORMAL HIGH (ref 70–99)
Phosphorus: 4.6 mg/dL (ref 2.5–4.6)
Phosphorus: 6.3 mg/dL — ABNORMAL HIGH (ref 2.5–4.6)
Potassium: 5.4 mmol/L — ABNORMAL HIGH (ref 3.5–5.1)
Potassium: 6.2 mmol/L — ABNORMAL HIGH (ref 3.5–5.1)
Sodium: 136 mmol/L (ref 135–145)
Sodium: 136 mmol/L (ref 135–145)

## 2020-11-18 LAB — APTT: aPTT: 28 seconds (ref 24–36)

## 2020-11-18 LAB — HEPARIN INDUCED PLATELET AB (HIT ANTIBODY): Heparin Induced Plt Ab: 0.335 OD (ref 0.000–0.400)

## 2020-11-18 MED ORDER — METOPROLOL TARTRATE 5 MG/5ML IV SOLN
INTRAVENOUS | Status: AC
  Filled 2020-11-18: qty 5

## 2020-11-18 MED ORDER — DEXMEDETOMIDINE HCL IN NACL 400 MCG/100ML IV SOLN
0.2000 ug/kg/h | INTRAVENOUS | Status: DC
  Administered 2020-11-18 – 2020-11-20 (×3): 0.2 ug/kg/h via INTRAVENOUS
  Filled 2020-11-18: qty 200
  Filled 2020-11-18: qty 100

## 2020-11-18 MED ORDER — ZINC OXIDE 40 % EX OINT
TOPICAL_OINTMENT | CUTANEOUS | Status: DC | PRN
  Filled 2020-11-18 (×6): qty 57

## 2020-11-18 MED ORDER — INSULIN ASPART 100 UNIT/ML IV SOLN: 10.0000 [IU] | Freq: Once | INTRAVENOUS | Status: DC

## 2020-11-18 MED ORDER — SODIUM ZIRCONIUM CYCLOSILICATE 10 G PO PACK
10.0000 g | PACK | Freq: Once | ORAL | Status: AC
  Administered 2020-11-18: 18:00:00 10 g
  Filled 2020-11-18: qty 1

## 2020-11-18 MED ORDER — INSULIN ASPART 100 UNIT/ML IV SOLN
10.0000 [IU] | Freq: Once | INTRAVENOUS | Status: AC
  Administered 2020-11-18: 18:00:00 10 [IU] via INTRAVENOUS

## 2020-11-18 MED ORDER — PROSOURCE TF PO LIQD
90.0000 mL | Freq: Every day | ORAL | Status: DC
  Administered 2020-11-18 – 2020-11-21 (×9): 90 mL
  Filled 2020-11-18 (×9): qty 90

## 2020-11-18 MED ORDER — DEXTROSE 50 % IV SOLN
1.0000 | Freq: Once | INTRAVENOUS | Status: AC
  Administered 2020-11-18: 18:00:00 50 mL via INTRAVENOUS
  Filled 2020-11-18: qty 50

## 2020-11-18 NOTE — Progress Notes (Signed)
Nutrition Follow-up  DOCUMENTATION CODES:   Obesity unspecified  INTERVENTION:   -Pivot 1.5 @ 30 ml/hr via NGT - 90 ml Prosource TF 6 times daily via tube - Advancement to goal per CCM  Goal rate for TF regimen: Pivot 1.5 @ 65 ml/hr with 90 ml Prosource TF QID. This regimen will provide 2620 kcal, 223 grams protein, and 1184 ml free water.   NUTRITION DIAGNOSIS:   Increased nutrient needs related to acute illness,catabolic illness (COVID-19 infection) as evidenced by estimated needs.  Ongoing.  GOAL:   Provide needs based on ASPEN/SCCM guidelines  Ongoing.  MONITOR:   Vent status,Labs,Weight trends,Skin,Other (Comment) (plan concerning nutrition support)   ASSESSMENT:   59 year old male with no significant medical history. He was found to be COVID-19 positive on 11/26. He presented to [facility] with profound hypoxemia on 11/28 and was emergently intubated in the ED; found to be in ARDS and transferred to [facility].  Significant Events: 11/28 - admission; emergent intubation; OGT placed; transfer from [facility] to [facility] 11/29- trickle TF initiation; proned 11/30- initial RD assessment; proned 12/1- begin TF rate advancement; proning protocol stopped 12/3- TF held due to copious thick, tan secretions; re-started and advancing TF later in the day 12/5- TF held due to copious thick, tan secretions; re-started trickle TF later in the day; proned 12/6- proned; advancing TF rate 12/7- proned 12/8- TF held d/t large volume aspiration; proned; reglan started 12/14- TF increased from 10 ml/hr to 20 ml/hr 12/16- CRRT started  12/26 -CRRT stopped 12/23 - s/p tracheostomy placed, tube was dislodged 12/24 -NGT placed IR -unsuccessful at post-pyloric placement, tip in stomach  Per nephrology note, the plan will be for intermittent dialysis if pt improves. Per CCM, attempts to wean off vent continue. Tolerated weaning on trach collar for 4 hours 12/26.  Requires vent at night.   Continues Precedex for agitation.  Pivot 1.5 infusing at 30 ml/hr via NGT (providing 1080 kcals and 67g protein).  Will need to restart Prosource TF to meet protein needs (was stopped when tube dislodged 12/23).  Admission weight: 264 lbs Current weight: 242 lbs.  I/Os: -14.8L since 12/13  Medications: Precedex  Labs reviewed: CBGs: 134-135 Elevated K, Mg  Diet Order:   Diet Order            Diet NPO time specified  Diet effective now                 EDUCATION NEEDS:   No education needs have been identified at this time  Skin:  Skin Assessment: Skin Integrity Issues: Skin Integrity Issues:: DTI,Unstageable,Other (Comment) DTI: sacrum Unstageable: full thickness to R nose Other: MASD on right buttocks  Last BM:  12/27 -type 7  Height:   Ht Readings from Last 1 Encounters:  11/11/20 6' (1.829 m)    Weight:   Wt Readings from Last 1 Encounters:  11/18/20 109.9 kg   BMI:  Body mass index is 32.86 kg/m.  Estimated Nutritional Needs:   Kcal:  2850 kcal (30 kcal/kg adjBW)  Protein:  210-220g  Fluid:  >/= 4 L/day  Tilda Franco, MS, RD, LDN Inpatient Clinical Dietitian Contact information available via Amion

## 2020-11-18 NOTE — TOC Progression Note (Signed)
Transition of Care [facility]) - Progression Note    Patient Details  Name: Jackson Figueroa MRN: [id number] Date of Birth: 10-30-1961  [facility] of [facility]) CM/SW Contact  Golda Acre, RN Phone Number: 11/18/2020, 8:19 AM  Clinical Narrative:    Significant Hospital Events   11/28 Presented to [facility] ER, intubated for hypoxemia, empiric abx, shock on levo. COVID Rx 11/29 Persistent shock, central line placed, ARDS protocol continued, proning initiated 11/30 Pressor requirements continued, NMB initiated to facilitate ventilation tube feeds started, initiated proning 12/01 Stopping NMB, proning positioning protocol discontinued, free water for rising creatinine and sodium 12/02 PEEP trending down. Renal function improved. Changed to versed from prop gtt d/t rising triglycerides  12/03 Remdesivir completed. Peep/FIO2 stable. Free water adjusted. Solumedrol reduced, fentanyl to Dilaudid, adding Oxycodone 12/04 Dyssynchrony, given paralytic overnight, changed to ketamine; vomited / aspirated, restarted paralytics 12/06 Proned, under NMB 12/07 Proning, NMB continued. Switched antibiotics for stenotrophomonas 12/13 Stopped oxycodone, added dilaudid oral, continuous nimbex infusion to prn, palliative care consulted 12/14 Changed to pressure control 12/15 Worsening vent synchrony after clamping OG tube 12/18 added reglan IV for ? Ileus developing  12/18 back on paralytics for "guppy breathing/ air stacking" on PCV ? ,mucus plug  12/19  Back on PCV and off paralytics  12/20 having intermittent fevers, atrial fibrillation with some ST changes.  Still requiring breakthrough Dilaudid boluses for ventilator synchrony, still having Significant gastric residual, refluxing gastric content into mouth 12/21: Infectious disease reengaged for stenotrophomonas multidrug-resistant in sputum, added systemic steroids. Started on minocycline per ID 12/22 stable decreased steroid dosing. Clonazepam added 12/23  trach placed. Versed off. Changed dilaudid from 4mg -.3mg , added clonidine and increased depakote. Added midodrine. OGT out during surg. Post-pyloric dislodged. Tip in stomach. Attempting trickle feeds w/ reglan   12/24 NG tube placed by IR 12/25 Dilaudid weaned to off, tolerated trach collar PLAN: may ned snf due to time on vent and in bed Following progression as above.   Expected Discharge Plan: Home/Self Care    Expected Discharge Plan and Services Expected Discharge Plan: Home/Self Care   Discharge Planning Services: CM Consult   Living arrangements for the past 2 months: Single Family Home                                       Social Determinants of Health (SDOH) Interventions    Readmission Risk Interventions No flowsheet data found.

## 2020-11-18 NOTE — Progress Notes (Addendum)
Patient ID: Jackson Figueroa, male   DOB: 20-Feb-1961, 59 y.o.   MRN: [id number]  [facility] Progress Note    Assessment/ Plan:   1. Acute kidney Injury: Anuric overnight.  Etiology suspected to be from sepsis/shock associated with COVID-19 infection.  No evidence of obstruction seen on earlier imaging.  SP CRRT 12/16- 26. DC'd 12/26, hope is for transition to intermittent dialysis given improving hemodynamic status.   2.  COVID-19 pneumonia/ARDS: Remains ventilator dependent status post tracheostomy on 12/23.  Weaning efforts per CCM protocol. 3.  Multidrug resistant stenotrophomonas infection: On antibiotic coverage with minocycline. 4.  Thrombocytopenia: currently ongoing evaluation for heparin-induced thrombocytopenia. 5.  Critical illness anemia: continue monitoring for overt losses.  Transfusion if hemoglobin <7.0.  Kelly Splinter, MD 11/18/2020, 10:04 AM    Subjective:   On vent / trach, FiO2 40%. BP's low-normal. Not responding to commands.    Objective:   BP 123/70 (BP Location: Left Arm)   Pulse (!) 112   Temp 99.9 F (37.7 C) (Oral)   Resp (!) 27   Ht 6' (1.829 m)   Wt 109.9 kg   SpO2 97%   BMI 32.86 kg/m   Intake/Output Summary (Last 24 hours) at 11/18/2020 1001 Last data filed at 11/18/2020 0900 Gross per 24 hour  Intake 601.81 ml  Output 662 ml  Net -60.19 ml   Weight change:   Physical Exam: Gen: Appears comfortable on ventilator via tracheostomy. CVS: Regular tachycardia, S1 and S2 normal Resp: Coarse breath sounds bilaterally, no distinct rales or rhonchi Abd: Soft, mildly distended, nontender, bowel sounds normal Ext: no pitting edema UE's/ LE's  Imaging: DG Chest Port 1 View  Result Date: 11/17/2020 CLINICAL DATA:  Acute respiratory failure. EXAM: PORTABLE CHEST 1 VIEW COMPARISON:  11/12/2020 FINDINGS: 0424 hours. Low lung volumes. The cardio pericardial silhouette is enlarged. Diffuse interstitial and patchy basilar airspace disease is  similar to prior. Feeding tube tip is in the stomach. Tracheostomy tube is new in the interval. Right PICC line tip is probably in the region of the innominate vein confluence, but catheter is not well seen over the mediastinum. Right IJ central line tip projects over the expected location of the low SVC given the leftward patient rotation on this study. IMPRESSION: 1. Interval tracheostomy tube placement. 2. Low volume film with persistent diffuse interstitial and patchy basilar airspace disease. Electronically Signed   By: Misty Stanley M.D.   On: 11/17/2020 05:03    Labs: BMET Recent Labs  Lab 11/15/20 0315 11/15/20 1617 11/16/20 0352 11/16/20 1549 11/17/20 0430 11/17/20 1603 11/18/20 0440  NA 134* 136 136 134* 137 136 136  K 4.2 4.4 4.2 4.7 4.5 5.2* 5.4*  CL 100 102 100 101 102 102 103  CO2 _0 GLUCOSE 105* 102* 115* 123* 162* 110* 112*  BUN 54* 53* 48* 46* 45* 46* 64*  CREATININE 2.02* 1.94* 1.68* 1.59* 1.57* 1.57* 2.31*  CALCIUM 8.4* 8.4* 9.1 8.9 8.5* 8.9 8.9  PHOS 3.5 3.7 3.0 3.4 3.4 3.1 4.6   CBC Recent Labs  Lab 11/15/20 0315 11/16/20 0352 11/17/20 0430 11/18/20 0440  WBC 14.3* 19.8* 14.8* 19.8*  NEUTROABS 11.1* 14.2* 11.4* 14.7*  HGB 7.8* 9.3* 8.6* 8.3*  HCT 24.5* 29.3* 27.6* 26.9*  MCV 102.5* 102.1* 103.4* 103.5*  PLT 141* 173 90* 111*   Medications:    . albuterol  2.5 mg Nebulization QID  . chlorhexidine gluconate (MEDLINE KIT)  15 mL Mouth Rinse  BID  . Chlorhexidine Gluconate Cloth  6 each Topical Q0600  . cloNIDine  0.3 mg Transdermal Weekly  . collagenase   Topical Daily  . fentaNYL  1 patch Transdermal Q72H  . linagliptin  5 mg Per Tube Daily  . mouth rinse  15 mL Mouth Rinse 10 times per day  . methylPREDNISolone (SOLU-MEDROL) injection  40 mg Intravenous Q12H  . minocycline  100 mg Per Tube Q12H  . oxyCODONE  5 mg Oral Q12H  . sodium chloride flush  10-40 mL Intracatheter Q12H  . sodium chloride flush  3 mL Intravenous Q12H      

## 2020-11-18 NOTE — Progress Notes (Signed)
Hypokalemia noted  Give 1 ampoule of D50 and 10 units of insulin 10 of Lokelma

## 2020-11-18 NOTE — Progress Notes (Signed)
Report was given to [facility], Charity fundraiser. [facility] was called and given report. Patient has successfully been transferred with [facility].

## 2020-11-18 NOTE — Progress Notes (Signed)
Results for Jackson Figueroa, Jackson Figueroa (MRN [id number]) as of 11/18/2020 18:02  Ref. Range 11/18/2020 16:01  Sodium Latest Ref Range: 135 - 145 mmol/L 136  Potassium Latest Ref Range: 3.5 - 5.1 mmol/L 6.2 (H)  Chloride Latest Ref Range: 98 - 111 mmol/L 102  CO2 Latest Ref Range: 22 - 32 mmol/L 24  Glucose Latest Ref Range: 70 - 99 mg/dL 114 (H)  BUN Latest Ref Range: 6 - 20 mg/dL 88 (H)  Creatinine Latest Ref Range: 0.61 - 1.24 mg/dL 2.92 (H)  Calcium Latest Ref Range: 8.9 - 10.3 mg/dL 9.2  Anion gap Latest Ref Range: 5 - 15  10  Phosphorus Latest Ref Range: 2.5 - 4.6 mg/dL 6.3 (H)  Albumin Latest Ref Range: 3.5 - 5.0 g/dL 2.4 (L)  GFR, Estimated Latest Ref Range: >60 mL/min 24 (L)   PM renal panel noted and Dr. Joelyn Oms and Dr. Ander Slade made aware. Orders received for lokelma, insulin, D50, and transfer to [facility] for anticipated HD. Per Dr. Joelyn Oms if bed unavailable at [facility] will likely restart CRRT in AM.

## 2020-11-18 NOTE — Progress Notes (Signed)
Transfer orders placed  Transfer to [facility] for IHD

## 2020-11-18 NOTE — Progress Notes (Signed)
NAME:  Jackson Figueroa, MRN:  [id number], DOB:  09-01-1961, LOS: 29 ADMISSION DATE:  10/20/2020, CONSULTATION DATE:  11/29 REFERRING MD:  Hyacinth Meeker, CHIEF COMPLAINT:  ARDS COVID 88   Brief History   59 year old male with no significant past medical history and a positive COVID-19 test on 11/26 presented to [facility] with profound hypoxemia on 11/28.  Intubated immediately upon arrival found to be in ARDS. Transferred to [facility] for ongoing ICU care  11/29.  Past Medical History  Obesity  Significant Hospital Events   11/28 Presented to [facility] ER, intubated for hypoxemia, empiric abx, shock on levo. COVID Rx 11/29 Persistent shock, central line placed, ARDS protocol continued, proning initiated 11/30 Pressor requirements continued, NMB initiated to facilitate ventilation tube feeds started, initiated proning 12/01 Stopping NMB, proning positioning protocol discontinued, free water for rising creatinine and sodium 12/02 PEEP trending down.  Renal function improved. Changed to versed from prop gtt d/t rising triglycerides  12/03 Remdesivir completed. Peep/FIO2 stable. Free water adjusted. Solumedrol reduced, fentanyl to Dilaudid, adding Oxycodone  12/04 Dyssynchrony, given paralytic overnight, changed to ketamine; vomited / aspirated, restarted paralytics 12/06 Proned, under NMB 12/07 Proning, NMB continued. Switched antibiotics for stenotrophomonas 12/13 Stopped oxycodone, added dilaudid oral, continuous nimbex infusion to prn, palliative care consulted 12/14 Changed to pressure control 12/15 Worsening vent synchrony after clamping OG tube 12/18 added reglan IV for ? Ileus developing  12/18 back on paralytics for "guppy breathing/ air stacking" on PCV ? ,mucus plug  12/19  Back on PCV and off paralytics  12/20 having intermittent fevers, atrial fibrillation with some ST changes.  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 Started on minocycline per ID 12/22 stable decreased steroid dosing. Clonazepam added 12/23 trach placed. Versed off. Changed dilaudid from 4mg -.3mg , added clonidine and increased depakote. Added midodrine. OGT out during surg. Post-pyloric dislodged. Tip in stomach. Attempting trickle feeds w/ reglan   12/24 NG tube placed by IR 12/25 Dilaudid weaned to off, tolerated trach collar 12/26 tolerated weaning for about 4 hours on trach collar Consults:   Nephrology 12/14 Palliative 12/13 ID signed off 12/17  Procedures:   ETT 11/28 >>trach 12/23 Bates CVL 11/29 >> 12/9 R PICC 12/9 >>  R IJ HD Cath 12/17 >>  Significant Diagnostic Tests:   ECHO 11/29 >> LVEF 50-55%, mild LVH, RV systolic function mildly reduced. Mild dilation of ascending aorta 81mm. LE Venous Duplex 12/8 >> negative for DVT   Micro Data:   COVID 11/28 >> Positive  Influenza A/B 11/28 >> negative  UC 11/28 >> negative  BCx2 11/28 >> negative  Tracheal aspirate 12/5 >> Stenotrophomonas maltophilia >> S-bactrim, levofloxacin UC 12/11 >> negative  Tracheal aspirate 12/11 >> Stenotrophomonas maltophilia >> S-bactrim, levofloxacin  BCx2 12/11 >> negative  Trach 12/ 19 >>> multidrug resistance stenotrophomonas  Antimicrobials/covid rx:   CTX 11/28 > 11/29 Azithromycin 11/28 > 11/29 Remdesivir 11/28 > 12/2 Prednisone 11/28 >  Baricitinib 12/4 > 12/5 Vanco 12/5 >> 12/7, 12/11 x1 Cefepime 12/5 >> 12/7, 12/11 >> 12/13 Levaquin 12/7 >> 12/16 Minocycline 12/21>>>  Interim history/subjective:   Tolerating trach collar weaning during the day Remains on vent this morning Precedex started for agitation, occasionally causing low blood pressures, on hold at present  Objective   Blood pressure 119/74, pulse (!) 111, temperature 99.9 F (37.7 C),  temperature source  Oral, resp. rate (!) 36, height 6' (1.829 m), weight 109.9 kg, SpO2 93 %.    Vent Mode: PCV FiO2 (%):  [40 %] 40 % Set Rate:  [15 bmp] 15 bmp PEEP:  [5 cmH20-8 cmH20] 5 cmH20 Pressure Support:  [10 cmH20] 10 cmH20 Plateau Pressure:  [19 cmH20-20 cmH20] 19 cmH20   Intake/Output Summary (Last 24 hours) at 11/18/2020 [id number] Last data filed at 11/18/2020 0400 Gross per 24 hour  Intake 632.06 ml  Output 767 ml  Net -134.94 ml   Filed Weights   11/15/20 0500 11/16/20 0500 11/18/20 0446  Weight: 112.7 kg 109.9 kg 109.9 kg    Examination:  Constitutional: Chronically ill-appearing Eyes: Pupils reactive  ears, nose, mouth, and throat: Tracheostomy tube in place Cardiovascular: S1-S2 appreciated Respiratory: Mildly tachypneic, scattered rhonchi Gastrointestinal: Bowel sounds appreciated Skin: Warm and dry Neurologic: Eyes open, looks around but does not track or follow commands Psychiatric: cannot assess  Labs show normal electrolytes, creatinine continues to drop, decrease leukocytosis, drop in platelets noted-did trend 173-90-111  Resolved Hospital Problem list   Post trach bleeding  Assessment & Plan:   ARDS due to COVID-19 pneumonia -Profoundly deconditioned -Tolerating trach collar -On ventilator for resting at night -Requiring low-dose Precedex  Acute metabolic encephalopathy -Prolonged illness -Was on multiple medications for sedation -Currently on fentanyl patch, oxycodone being tapered off -Precedex at low doses(was on valproic acid, clonazepam)  Multifactorial ATN -CRRT held on 12/26 -Nephrology continues to follow -Goal is to transition to intermittent dialysis  MDR stenotrophomonas pneumonia versus tracheitis -On minocycline -WBC trending down  Ileus -Postpyloric tube in place -Continue tube feeds  Thrombocytopenia -HIT panel sent-follow panel  -Trend is stabilizing  Has been hospitalized for a long time wound will have profound  deconditioning that will require very long rehabilitation if at all possible Prognosis is guarded  Best practice (evaluated daily)  Diet: Continue tube feeds Pain/Anxiety/Delirium protocol (if indicated): as above VAP protocol (if indicated): yes DVT prophylaxis: heparin sq  GI prophylaxis: famotidine Glucose control: off Mobility: bed rest Last date of multidisciplinary goals of care discussion 12/21 with sister.   Family and staff present n/a Summary of discussion n/a Follow up goals of care discussion due: 12/29; family still wants full scope of care  Code Status: full code  Disposition: ICU  The patient is critically ill with multiple organ systems failure and requires high complexity decision making for assessment and support, frequent evaluation and titration of therapies, application of advanced monitoring technologies and extensive interpretation of multiple databases. Critical Care Time devoted to patient care services described in this note independent of APP/resident time (if applicable)  is 35 minutes.   Sherrilyn Rist MD [facility] Personal pager: [phone] If unanswered, please page CCM On-call: [phone]

## 2020-11-19 ENCOUNTER — Inpatient Hospital Stay (HOSPITAL_COMMUNITY): Payer: BC Managed Care – PPO

## 2020-11-19 LAB — RENAL FUNCTION PANEL
Albumin: 2.1 g/dL — ABNORMAL LOW (ref 3.5–5.0)
Albumin: 2.1 g/dL — ABNORMAL LOW (ref 3.5–5.0)
Albumin: 2 g/dL — ABNORMAL LOW (ref 3.5–5.0)
Anion gap: 14 (ref 5–15)
Anion gap: 14 (ref 5–15)
Anion gap: 9 (ref 5–15)
BUN: 106 mg/dL — ABNORMAL HIGH (ref 6–20)
BUN: 110 mg/dL — ABNORMAL HIGH (ref 6–20)
BUN: 48 mg/dL — ABNORMAL HIGH (ref 6–20)
CO2: 20 mmol/L — ABNORMAL LOW (ref 22–32)
CO2: 21 mmol/L — ABNORMAL LOW (ref 22–32)
CO2: 27 mmol/L (ref 22–32)
Calcium: 9.3 mg/dL (ref 8.9–10.3)
Calcium: 9.3 mg/dL (ref 8.9–10.3)
Calcium: 8.1 mg/dL — ABNORMAL LOW (ref 8.9–10.3)
Chloride: 101 mmol/L (ref 98–111)
Chloride: 101 mmol/L (ref 98–111)
Chloride: 100 mmol/L (ref 98–111)
Creatinine, Ser: 3.34 mg/dL — ABNORMAL HIGH (ref 0.61–1.24)
Creatinine, Ser: 3.4 mg/dL — ABNORMAL HIGH (ref 0.61–1.24)
Creatinine, Ser: 1.81 mg/dL — ABNORMAL HIGH (ref 0.61–1.24)
GFR, Estimated: 21 mL/min — ABNORMAL LOW (ref >60)
GFR, Estimated: 20 mL/min — ABNORMAL LOW (ref >60)
GFR, Estimated: 43 mL/min — ABNORMAL LOW (ref >60)
Glucose, Bld: 137 mg/dL — ABNORMAL HIGH (ref 70–99)
Glucose, Bld: 129 mg/dL — ABNORMAL HIGH (ref 70–99)
Glucose, Bld: 105 mg/dL — ABNORMAL HIGH (ref 70–99)
Phosphorus: 8.2 mg/dL — ABNORMAL HIGH (ref 2.5–4.6)
Phosphorus: 8.6 mg/dL — ABNORMAL HIGH (ref 2.5–4.6)
Phosphorus: 4.1 mg/dL (ref 2.5–4.6)
Potassium: 6.1 mmol/L — ABNORMAL HIGH (ref 3.5–5.1)
Potassium: 6.3 mmol/L (ref 3.5–5.1)
Potassium: 3.2 mmol/L — ABNORMAL LOW (ref 3.5–5.1)
Sodium: 135 mmol/L (ref 135–145)
Sodium: 136 mmol/L (ref 135–145)
Sodium: 136 mmol/L (ref 135–145)

## 2020-11-19 LAB — CBC WITH DIFFERENTIAL/PLATELET
Abs Immature Granulocytes: 1.32 10*3/uL — ABNORMAL HIGH (ref 0.00–0.07)
Basophils Absolute: 0.1 10*3/uL (ref 0.0–0.1)
Basophils Relative: 0 %
Eosinophils Absolute: 0.1 10*3/uL (ref 0.0–0.5)
Eosinophils Relative: 0 %
HCT: 25.6 % — ABNORMAL LOW (ref 39.0–52.0)
Hemoglobin: 8.1 g/dL — ABNORMAL LOW (ref 13.0–17.0)
Immature Granulocytes: 6 %
Lymphocytes Relative: 5 %
Lymphs Abs: 1.2 10*3/uL (ref 0.7–4.0)
MCH: 32.1 pg (ref 26.0–34.0)
MCHC: 31.6 g/dL (ref 30.0–36.0)
MCV: 101.6 fL — ABNORMAL HIGH (ref 80.0–100.0)
Monocytes Absolute: 1.5 10*3/uL — ABNORMAL HIGH (ref 0.1–1.0)
Monocytes Relative: 7 %
Neutro Abs: 18.7 10*3/uL — ABNORMAL HIGH (ref 1.7–7.7)
Neutrophils Relative %: 82 %
Platelets: 125 10*3/uL — ABNORMAL LOW (ref 150–400)
RBC: 2.52 MIL/uL — ABNORMAL LOW (ref 4.22–5.81)
RDW: 14.6 % (ref 11.5–15.5)
WBC: 22.8 10*3/uL — ABNORMAL HIGH (ref 4.0–10.5)
nRBC: 0 % (ref 0.0–0.2)

## 2020-11-19 LAB — GLUCOSE, CAPILLARY
Glucose-Capillary: 103 mg/dL — ABNORMAL HIGH (ref 70–99)
Glucose-Capillary: 106 mg/dL — ABNORMAL HIGH (ref 70–99)
Glucose-Capillary: 116 mg/dL — ABNORMAL HIGH (ref 70–99)
Glucose-Capillary: 122 mg/dL — ABNORMAL HIGH (ref 70–99)
Glucose-Capillary: 147 mg/dL — ABNORMAL HIGH (ref 70–99)
Glucose-Capillary: 95 mg/dL (ref 70–99)

## 2020-11-19 LAB — HEPATITIS B CORE ANTIBODY, IGM: Hep B C IgM: NONREACTIVE

## 2020-11-19 LAB — HEPATITIS B SURFACE ANTIGEN: Hepatitis B Surface Ag: NONREACTIVE

## 2020-11-19 LAB — MAGNESIUM: Magnesium: 3.1 mg/dL — ABNORMAL HIGH (ref 1.7–2.4)

## 2020-11-19 LAB — APTT: aPTT: 26 seconds (ref 24–36)

## 2020-11-19 LAB — HEPATITIS B SURFACE ANTIBODY,QUALITATIVE: Hep B S Ab: NONREACTIVE

## 2020-11-19 MED ORDER — HEPARIN SODIUM (PORCINE) 5000 UNIT/ML IJ SOLN
5000.0000 [IU] | Freq: Three times a day (TID) | INTRAMUSCULAR | Status: DC
  Administered 2020-11-19 – 2020-11-21 (×7): 5000 [IU] via SUBCUTANEOUS
  Filled 2020-11-19 (×7): qty 1

## 2020-11-19 MED ORDER — HEPARIN SODIUM (PORCINE) 1000 UNIT/ML IJ SOLN: 1000.0000 [IU] | Freq: Once | INTRAMUSCULAR | Status: AC

## 2020-11-19 MED ORDER — METHYLPREDNISOLONE SODIUM SUCC 40 MG IJ SOLR
40.0000 mg | INTRAMUSCULAR | Status: AC
  Administered 2020-11-20 – 2020-11-21 (×2): 40 mg via INTRAVENOUS
  Filled 2020-11-19 (×3): qty 1

## 2020-11-19 MED ORDER — FAMOTIDINE 40 MG/5ML PO SUSR
20.0000 mg | Freq: Every day | ORAL | Status: DC
  Administered 2020-11-20 – 2020-12-13 (×23): 20 mg
  Filled 2020-11-19 (×25): qty 2.5

## 2020-11-19 MED ORDER — SODIUM ZIRCONIUM CYCLOSILICATE 5 G PO PACK
10.0000 g | PACK | Freq: Once | ORAL | Status: AC
  Administered 2020-11-19: 05:00:00 10 g
  Filled 2020-11-19: qty 2

## 2020-11-19 MED ORDER — SODIUM CHLORIDE 0.9 % IV SOLN: INTRAVENOUS | Status: DC

## 2020-11-19 MED ORDER — SODIUM CHLORIDE 0.9 % IV BOLUS: 1000.0000 mL | Freq: Once | INTRAVENOUS | Status: DC

## 2020-11-19 MED ORDER — DARBEPOETIN ALFA 100 MCG/0.5ML IJ SOSY
100.0000 ug | PREFILLED_SYRINGE | INTRAMUSCULAR | Status: DC
  Filled 2020-11-19: qty 0.5

## 2020-11-19 MED ORDER — DARBEPOETIN ALFA 100 MCG/0.5ML IJ SOSY
PREFILLED_SYRINGE | INTRAMUSCULAR | Status: AC
  Administered 2020-11-19: 18:00:00 100 ug via INTRAVENOUS
  Filled 2020-11-19: qty 0.5

## 2020-11-19 MED ORDER — HEPARIN SODIUM (PORCINE) 1000 UNIT/ML IJ SOLN
INTRAMUSCULAR | Status: AC
  Administered 2020-11-19: 17:00:00 2100 [IU] via INTRAVENOUS
  Filled 2020-11-19: qty 4

## 2020-11-19 MED ORDER — FENTANYL 50 MCG/HR TD PT72
1.0000 | MEDICATED_PATCH | TRANSDERMAL | Status: DC
  Administered 2020-11-19: 10:00:00 1 via TRANSDERMAL
  Filled 2020-11-19: qty 1

## 2020-11-19 NOTE — Progress Notes (Addendum)
CRITICAL VALUE ALERT  Critical Value:  K= 6.3  Date & Time Notied:  11/19/2020 @ 0430.  Provider Notified: Pola Corn MD and on call renal MD Dr Marisue Humble  Orders Received/Actions taken: new stat orders from [facility]; waiting on call back from Renal   11/19/20 @ 0439: Dr Marisue Humble called back and is aware of AM potassium level. Confirmed that pt is to get HD this AM and to go ahead and give [facility] order that was written by [facility] MD.

## 2020-11-19 NOTE — Progress Notes (Signed)
Clarified with Dr Arlean Hopping the order of bolus and 126ml/hr continues NaCL and ordered to not give the bolus and just give the continues  100 ml/hr NaCl by primary RN

## 2020-11-19 NOTE — Progress Notes (Signed)
Patient ID: Jackson Figueroa, male   DOB: June 05, 1961, 59 y.o.   MRN: [id number]  [facility] Progress Note    Assessment/ Plan:   1. Acute kidney Injury: Anuric overnight.  Etiology suspected to be from sepsis/shock associated with COVID-19 infection.  No evidence of obstruction seen on earlier imaging.  SP CRRT 12/16- 12/26. Looks very dry pm exam, making some urine now off CRRT, which is dark. Will start IVF"s 100/hr and bolus 1 L NS.   Will get regular HD today. 2.  COVID-19 pneumonia/ARDS: Remains ventilator dependent status post tracheostomy on 12/23.  Weaning efforts per CCM protocol. 3.  Multidrug resistant stenotrophomonas infection: On antibiotic coverage with minocycline. 4.  Thrombocytopenia: currently ongoing evaluation for heparin-induced thrombocytopenia. 5.  Critical illness anemia: continue monitoring for overt losses.  Transfusion if hemoglobin <7.0.  Kelly Splinter, MD 11/19/2020, 12:59 PM    Subjective:   Made some urine, looks dark.  K+ up, for HD later today.    Objective:   BP 123/78 (BP Location: Left Arm)   Pulse (!) 111   Temp 98.6 F (37 C) (Axillary)   Resp (!) 29   Ht 6' (1.829 m)   Wt 108.3 kg   SpO2 99%   BMI 32.38 kg/m   Intake/Output Summary (Last 24 hours) at 11/19/2020 1259 Last data filed at 11/19/2020 1200 Gross per 24 hour  Intake 1110 ml  Output --  Net 1110 ml   Weight change: -1.6 kg  Physical Exam: Gen: Appears comfortable on ventilator via tracheostomy. CVS: Regular tachycardia, S1 and S2 normal Resp: Coarse breath sounds bilaterally, no distinct rales or rhonchi Abd: Soft, mildly distended, nontender, bowel sounds normal Ext: no edema UE's/ LE's, poor skin turgor  Imaging: DG Abd Portable 1V  Result Date: 11/19/2020 CLINICAL DATA:  Evaluate feeding tube placement. EXAM: PORTABLE ABDOMEN - 1 VIEW COMPARISON:  11/14/2020 FINDINGS: The tip of the feeding tube is not significantly changed in remains at the level of the GE  junction. Bowel gas pattern is unremarkable. IMPRESSION: Feeding tube tip remains at the GE junction. Electronically Signed   By: Kerby Moors M.D.   On: 11/19/2020 12:19    Labs: BMET Recent Labs  Lab 11/16/20 1549 11/17/20 0430 11/17/20 1603 11/18/20 0440 11/18/20 1601 11/19/20 0204 11/19/20 0328  NA 134* 137 136 136 136 135 136  K 4.7 4.5 5.2* 5.4* 6.2* 6.1* 6.3*  CL 101 102 102 103 102 101 101  CO2 _0 20* 21*  GLUCOSE 123* 162* 110* 112* 114* 137* 129*  BUN 46* 45* 46* 64* 88* 106* 110*  CREATININE 1.59* 1.57* 1.57* 2.31* 2.92* 3.34* 3.40*  CALCIUM 8.9 8.5* 8.9 8.9 9.2 9.3 9.3  PHOS 3.4 3.4 3.1 4.6 6.3* 8.2* 8.6*   CBC Recent Labs  Lab 11/16/20 0352 11/17/20 0430 11/18/20 0440 11/19/20 0204  WBC 19.8* 14.8* 19.8* 22.8*  NEUTROABS 14.2* 11.4* 14.7* 18.7*  HGB 9.3* 8.6* 8.3* 8.1*  HCT 29.3* 27.6* 26.9* 25.6*  MCV 102.1* 103.4* 103.5* 101.6*  PLT 173 90* 111* 125*   Medications:    . albuterol  2.5 mg Nebulization QID  . chlorhexidine gluconate (MEDLINE KIT)  15 mL Mouth Rinse BID  . Chlorhexidine Gluconate Cloth  6 each Topical Q0600  . famotidine  20 mg Per Tube QHS  . feeding supplement (PROSource TF)  90 mL Per Tube 6 X Daily  . fentaNYL  1 patch Transdermal Q72H  . heparin injection (subcutaneous)  5,000  Units Subcutaneous Q8H  . linagliptin  5 mg Per Tube Daily  . mouth rinse  15 mL Mouth Rinse 10 times per day  . [START ON 11/20/2020] methylPREDNISolone (SOLU-MEDROL) injection  40 mg Intravenous Q24H  . minocycline  100 mg Per Tube Q12H  . sodium chloride flush  10-40 mL Intracatheter Q12H  . sodium chloride flush  3 mL Intravenous Q12H     

## 2020-11-19 NOTE — Consult Note (Addendum)
WOC Nurse Consult Note: Patient transferred last night from [facility], now receiving care at [facility] 2M15 Reason for Consult: DTI sacrum and MASD to sacrum. 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 Pressure Injury POA: No Wound bed: the entire area is purple with fissures and skin loss noted to the intergluteal cleft, mostly MASD/IAD Drainage (amount, consistency, odor) None Dressing procedure/placement/frequency: #1 Clean the entire buttocks area with no rinse cleaner, pat dry. Apply Desitin (previously ordered) to prevent extended moisture associated with incontinence. Currently has a condom cath in place and a flexiseal. Apply daily or PRN soiling.  #2 Paint the left great toe with betadine and allow to air dry. Bilat feet are currently in Prevalon boots. Repeat daily #3 Paint the blackened area on the nose with a small amount of betadine and allow to air dry. Repeat daily.  #4 Clean the area surrounding the trach opening with NS, dry as much as possible then apply trach drawtex split 4 x 4 gauze [place] # [phone]) around the trach tube. Change daily or PRN soiling.   Monitor the wound area(s) for worsening of condition such as: Signs/symptoms of infection, increase in size, development of or worsening of odor, development of pain, or increased pain at the affected locations.   Notify the medical team if any of these develop.  Thank you for the consult. WOC nurse will continue to follow weekly Please re-consult the WOC team if needed.  [name] [name], MSN, RN, CMSRN, Angus Seller, [facility] Wound Treatment Associate Pager [phone]

## 2020-11-19 NOTE — Progress Notes (Signed)
Pharmacy Heparin Induced Thrombocytopenia (HIT) Note:  Jackson Figueroa is an 59 y.o. male being evaluated for HIT.   Auto-populate labs:  Heparin Induced Plt Ab  Date/Time Value Ref Range Status  11/17/2020 10:32 AM 0.335 0.000 - 0.400 OD Final    Comment:    (NOTE) Performed At: [facility] [address] [place], [place] [id number] Jolene Schimke MD [id number]    Recommendations (A or B) are based on available lab results (HIT antibody and/or SRA) and the HIT algorithm    A. HIT antibody result available  HIT ruled out    No SRA needed  Continue heparin / LMWH  No allergy documentation needed  Elmer Sow, PharmD, BCPS, BCCCP Clinical Pharmacist [phone]  Please check AMION for all [facility] Pharmacy numbers  11/19/2020 7:29 AM

## 2020-11-19 NOTE — Progress Notes (Signed)
NAME:  Jackson Figueroa, MRN:  [id number], DOB:  [birth year], LOS: 30 ADMISSION DATE:  10/20/2020, CONSULTATION DATE:  11/29 REFERRING MD:  Hyacinth Meeker, CHIEF COMPLAINT:  ARDS COVID 83   Brief History   59 year old male with no significant past medical history and a positive COVID-19 test on 11/26 presented to [facility] with profound hypoxemia on 11/28.  Intubated immediately upon arrival found to be in ARDS. Transferred to [facility] for ongoing ICU care  11/29.  Past Medical History  Obesity  Significant Hospital Events   11/28 Presented to [facility] ER, intubated for hypoxemia, empiric abx, shock on levo. COVID Rx 11/29 Persistent shock, central line placed, ARDS protocol continued, proning initiated 11/30 Pressor requirements continued, NMB initiated to facilitate ventilation tube feeds started, initiated proning 12/01 Stopping NMB, proning positioning protocol discontinued, free water for rising creatinine and sodium 12/02 PEEP trending down.  Renal function improved. Changed to versed from prop gtt d/t rising triglycerides  12/03 Remdesivir completed. Peep/FIO2 stable. Free water adjusted. Solumedrol reduced, fentanyl to Dilaudid, adding Oxycodone  12/04 Dyssynchrony, given paralytic overnight, changed to ketamine; vomited / aspirated, restarted paralytics 12/06 Proned, under NMB 12/07 Proning, NMB continued. Switched antibiotics for stenotrophomonas 12/13 Stopped oxycodone, added dilaudid oral, continuous nimbex infusion to prn, palliative care consulted 12/14 Changed to pressure control 12/15 Worsening vent synchrony after clamping OG tube 12/18 added reglan IV for ? Ileus developing  12/18 back on paralytics for "guppy breathing/ air stacking" on PCV ? ,mucus plug  12/19  Back on PCV and off paralytics  12/20 having intermittent fevers, atrial fibrillation with some ST changes.  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 Started on minocycline per ID 12/22 stable decreased steroid dosing. Clonazepam added 12/23 trach placed. Versed off. Changed dilaudid from 4mg -.3mg , added clonidine and increased depakote. Added midodrine. OGT out during surg. Post-pyloric dislodged. Tip in stomach. Attempting trickle feeds w/ reglan   12/24 NG tube placed by IR 12/25 Dilaudid weaned to off, tolerated trach collar 12/26 tolerated weaning for about 4 hours on trach collar Consults:   Nephrology 12/14 Palliative 12/13 ID signed off 12/17  Procedures:   ETT 11/28 >>trach 12/23 Bates CVL 11/29 >> 12/9 R PICC 12/9 >>  R IJ HD Cath 12/17 >>  Significant Diagnostic Tests:   ECHO 11/29 >> LVEF 50-55%, mild LVH, RV systolic function mildly reduced. Mild dilation of ascending aorta 58mm. LE Venous Duplex 12/8 >> negative for DVT   Micro Data:   COVID 11/28 >> Positive  Influenza A/B 11/28 >> negative  UC 11/28 >> negative  BCx2 11/28 >> negative  Tracheal aspirate 12/5 >> Stenotrophomonas maltophilia >> S-bactrim, levofloxacin UC 12/11 >> negative  Tracheal aspirate 12/11 >> Stenotrophomonas maltophilia >> S-bactrim, levofloxacin  BCx2 12/11 >> negative  Trach 12/ 19 >>> multidrug resistance stenotrophomonas  Antimicrobials/covid rx:   CTX 11/28 > 11/29 Azithromycin 11/28 > 11/29 Remdesivir 11/28 > 12/2 Prednisone 11/28 >  Baricitinib 12/4 > 12/5 Vanco 12/5 >> 12/7, 12/11 x1 Cefepime 12/5 >> 12/7, 12/11 >> 12/13 Levaquin 12/7 >> 12/16 Minocycline 12/21>>>  Interim history/subjective:  Transferred to [facility]. Rx meds for hyperkalemia. Not really responsive for me on precedex gtt  Objective   Blood pressure 102/69, pulse 95, temperature 98.3 F (36.8 C), temperature source Axillary, resp. rate (!) 22, height 6' (1.829 m), weight 108.3 kg,  SpO2 93 %.    Vent Mode: PCV FiO2 (%):  [28 %-40 %] 30 % Set Rate:  [15 bmp] 15 bmp PEEP:  [5 cmH20] 5 cmH20   Intake/Output Summary (Last 24 hours) at 11/19/2020 [id number] Last data filed at 11/19/2020 0700 Gross per 24 hour  Intake 1037.08 ml  Output --  Net 1037.08 ml   Filed Weights   11/16/20 0500 11/18/20 0446 11/19/20 0500  Weight: 109.9 kg 109.9 kg 108.3 kg    Examination:  Constitutional: ill appearing man on vent  Eyes: pupils pinpoint, reactive, equal, not tracking Ears, nose, mouth, and throat: MM dry, tracheostomy site with purulent output, removed some of the sutures to explore ostomy and relieve pressure on wound, I was unable to get one of the the stay sutures out, will ask ENT when they come by to help  Cardiovascular: RRR, ext warm Respiratory: Scattered rhonci, clear secretions from tracheostomy tube Gastrointestinal: Soft, +BS Skin: No rashes, normal turgor Neurologic: profoundly weak, unable to get him to follow commands Psychiatric: RASS -2  BUN/K/Cr up WBC up Plts improved  Resolved Hospital Problem list   Post trach bleeding  Assessment & Plan:   ARDS due to COVID-19 pneumonia Profound deconditioning Metabolic encephalopathy- combination ICU delirium, medication effects, this plus deconditioning remain large barrier to vent liberation Multifactorial ATN- now likely HD dependent, tx to [facility] 12/27 for iHD trial 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- on minocycline day 6 Thrombocytopenia- improving, HIT neg Borderline Bps WBC up- question ongoing sepsis, question trach site infection Chronic steroids- unclear why still on  Stop clonidine patch Drop fentanyl patch in half, continue PRNs Work toward [employer] trials May try to culture tracheostomy site Minocycline day 6/7 Will need months of rehab to regain any functionality provided no further complications  Best practice (evaluated daily)  Diet: Continue tube feeds Pain/Anxiety/Delirium protocol  (if indicated): as above VAP protocol (if indicated): yes DVT prophylaxis: heparin sq  GI prophylaxis: famotidine Glucose control: off Mobility: bed rest Last date of multidisciplinary goals of care discussion 12/21 with sister.   Family and staff present n/a Summary of discussion n/a Follow up goals of care discussion due: 12/29; family still wants full scope of care  Code Status: full code  Disposition: ICU   Patient critically ill due to COVID ARDS, metabolic encephalopathy Interventions to address this today sedation and vent weaning Risk of deterioration without these interventions is high  I personally spent 40 minutes providing critical care not including any separately billable procedures  Erskine Emery MD [facility] 11/19/2020 8:03 AM Personal pager: [phone] If unanswered, please page CCM On-call: [phone]

## 2020-11-19 NOTE — Progress Notes (Addendum)
Brief Nutrition Note  RN alerted RD to pt with unbridled small-bore NG tube in place. RN asking if NG tube can be bridled. Per chart review, NG tube placed by diagnostic radiology at [facility] Upon inspection, NG tube found to be secured with tape at 52 cm in the left nare. Question whether tube is in stomach at this cm marking. Discussed obtaining a KUB with CCM MD who agreed.  If NG tube in stomach per KUB, will plan to bridle tube today. If NG tube not in place, will plan to exchange for Cortrak tube and bridle tomorrow. Plan discussed with CCM MD and RN.  ADDENDUM (1338): KUB showing tube tip at GE junction. Discussed with RN who attempted to advance but tube came out of pt's mouth. RN to remove NG tube. Cortrak team consult placed.   Mertie Clause, MS, RD, LDN Inpatient Clinical Dietitian Please see AMiON for contact information.

## 2020-11-19 NOTE — Progress Notes (Signed)
eLink Physician-Brief Progress Note Patient Name: Jackson Figueroa DOB: [birth year] MRN: [id number]   Date of Service  11/19/2020  HPI/Events of Note  Hyperkalemia - K+ = 6.3.   eICU Interventions  Plan: 1. Lokelma 10 gm per tube now. 2. Bedside nurse to call nephrology to see what dialysis plans are.      Intervention Category Major Interventions: Electrolyte abnormality - evaluation and management  Jury Caserta Eugene 11/19/2020, 4:28 AM

## 2020-11-20 ENCOUNTER — Inpatient Hospital Stay (HOSPITAL_COMMUNITY): Payer: BC Managed Care – PPO

## 2020-11-20 LAB — CBC WITH DIFFERENTIAL/PLATELET
Abs Immature Granulocytes: 0.88 10*3/uL — ABNORMAL HIGH (ref 0.00–0.07)
Basophils Absolute: 0 10*3/uL (ref 0.0–0.1)
Basophils Relative: 0 %
Eosinophils Absolute: 0.4 10*3/uL (ref 0.0–0.5)
Eosinophils Relative: 3 %
HCT: 24.7 % — ABNORMAL LOW (ref 39.0–52.0)
Hemoglobin: 8.1 g/dL — ABNORMAL LOW (ref 13.0–17.0)
Immature Granulocytes: 6 %
Lymphocytes Relative: 9 %
Lymphs Abs: 1.2 10*3/uL (ref 0.7–4.0)
MCH: 32.8 pg (ref 26.0–34.0)
MCHC: 32.8 g/dL (ref 30.0–36.0)
MCV: 100 fL (ref 80.0–100.0)
Monocytes Absolute: 1.3 10*3/uL — ABNORMAL HIGH (ref 0.1–1.0)
Monocytes Relative: 9 %
Neutro Abs: 9.9 10*3/uL — ABNORMAL HIGH (ref 1.7–7.7)
Neutrophils Relative %: 73 %
Platelets: 118 10*3/uL — ABNORMAL LOW (ref 150–400)
RBC: 2.47 MIL/uL — ABNORMAL LOW (ref 4.22–5.81)
RDW: 14.7 % (ref 11.5–15.5)
WBC: 13.7 10*3/uL — ABNORMAL HIGH (ref 4.0–10.5)
nRBC: 0 % (ref 0.0–0.2)

## 2020-11-20 LAB — GLUCOSE, CAPILLARY
Glucose-Capillary: 101 mg/dL — ABNORMAL HIGH (ref 70–99)
Glucose-Capillary: 110 mg/dL — ABNORMAL HIGH (ref 70–99)
Glucose-Capillary: 112 mg/dL — ABNORMAL HIGH (ref 70–99)
Glucose-Capillary: 113 mg/dL — ABNORMAL HIGH (ref 70–99)
Glucose-Capillary: 115 mg/dL — ABNORMAL HIGH (ref 70–99)
Glucose-Capillary: 94 mg/dL (ref 70–99)

## 2020-11-20 LAB — SUSCEPTIBILITY, AER + ANAEROB

## 2020-11-20 LAB — RENAL FUNCTION PANEL
Albumin: 2 g/dL — ABNORMAL LOW (ref 3.5–5.0)
Albumin: 2 g/dL — ABNORMAL LOW (ref 3.5–5.0)
Anion gap: 13 (ref 5–15)
Anion gap: 12 (ref 5–15)
BUN: 62 mg/dL — ABNORMAL HIGH (ref 6–20)
BUN: 83 mg/dL — ABNORMAL HIGH (ref 6–20)
CO2: 24 mmol/L (ref 22–32)
CO2: 23 mmol/L (ref 22–32)
Calcium: 8.4 mg/dL — ABNORMAL LOW (ref 8.9–10.3)
Calcium: 8.6 mg/dL — ABNORMAL LOW (ref 8.9–10.3)
Chloride: 99 mmol/L (ref 98–111)
Chloride: 103 mmol/L (ref 98–111)
Creatinine, Ser: 2.34 mg/dL — ABNORMAL HIGH (ref 0.61–1.24)
Creatinine, Ser: 2.87 mg/dL — ABNORMAL HIGH (ref 0.61–1.24)
GFR, Estimated: 32 mL/min — ABNORMAL LOW (ref >60)
GFR, Estimated: 25 mL/min — ABNORMAL LOW (ref >60)
Glucose, Bld: 103 mg/dL — ABNORMAL HIGH (ref 70–99)
Glucose, Bld: 109 mg/dL — ABNORMAL HIGH (ref 70–99)
Phosphorus: 6.2 mg/dL — ABNORMAL HIGH (ref 2.5–4.6)
Phosphorus: 8.2 mg/dL — ABNORMAL HIGH (ref 2.5–4.6)
Potassium: 3.7 mmol/L (ref 3.5–5.1)
Potassium: 4.1 mmol/L (ref 3.5–5.1)
Sodium: 136 mmol/L (ref 135–145)
Sodium: 138 mmol/L (ref 135–145)

## 2020-11-20 LAB — APTT: aPTT: 29 seconds (ref 24–36)

## 2020-11-20 LAB — SUSCEPTIBILITY RESULT

## 2020-11-20 LAB — MAGNESIUM: Magnesium: 2.4 mg/dL (ref 1.7–2.4)

## 2020-11-20 MED ORDER — MIDODRINE HCL 5 MG PO TABS
10.0000 mg | ORAL_TABLET | Freq: Three times a day (TID) | ORAL | Status: DC
  Administered 2020-11-21 – 2020-11-23 (×7): 10 mg
  Filled 2020-11-20 (×8): qty 2

## 2020-11-20 MED ORDER — FENTANYL CITRATE (PF) 100 MCG/2ML IJ SOLN: 50.0000 ug | INTRAMUSCULAR | Status: DC | PRN

## 2020-11-20 MED ORDER — CHLORHEXIDINE GLUCONATE 0.12 % MT SOLN
OROMUCOSAL | Status: AC
  Filled 2020-11-20: qty 15

## 2020-11-20 MED ORDER — MIDODRINE HCL 5 MG PO TABS
10.0000 mg | ORAL_TABLET | Freq: Three times a day (TID) | ORAL | Status: DC
  Administered 2020-11-20 (×2): 10 mg via ORAL
  Filled 2020-11-20 (×2): qty 2

## 2020-11-20 MED FILL — Fentanyl Citrate Preservative Free (PF) Inj 100 MCG/2ML: INTRAMUSCULAR | Qty: 2 | Status: AC

## 2020-11-20 NOTE — Progress Notes (Signed)
[facility] PROGRESS NOTE  Assessment/ Plan: Pt is a 59 y.o. yo male with history of COVID-19, ARDS, mechanical ventilation, shock with dialysis dependent AKI.  #Acute kidney injury, dialysis dependent: ATN due to septic shock, COVID-19 infection.  Kidney ultrasound ruled out obstruction.  Required CRRT from 12/16-12/26.  Since then intermittent hemodialysis, last HD on 12/28 with no UF.  Volume status acceptable.  Started IV fluid on 12/28.  Urine output around 450 cc overnight.  Watch for renal recovery.  No need for dialysis today.  #ARDS due to COVID-19 pneumonia: Required mechanical ventilation, ECMO.  Currently has tracheostomy on vent.  Per PCCM.  #Acute metabolic encephalopathy combination of medication, ICU delirium, sedatives.  Monitor mental status.  Opens eyes.  #MDR stenotrophomonas pneumonia: Completed antibiotics per primary team.  #Severe physical deconditioning: PT OT, the team is consulting for PEG placement.  #Anemia due to critical illness: Transfusion as needed.  On ESA.  #Hypotension: Borderline BP.  Not on antihypertensive.  I will add midodrine from tube feeds.  Subjective: Seen and examined ICU.  Opens eyes with the name but not following commands.  Has tracheostomy.  Urine output around 450 cc.  Discussed with ICU team. Objective Vital signs in last 24 hours: Vitals:   11/20/20 0700 11/20/20 0739 11/20/20 0759 11/20/20 0800  BP: 103/67   (!) 94/59  Pulse: 96   91  Resp: (!) 22   (!) 30  Temp:   98.6 F (37 C)   TempSrc:   Axillary   SpO2: 99% 98%  96%  Weight:      Height:       Weight change:   Intake/Output Summary (Last 24 hours) at 11/20/2020 0857 Last data filed at 11/20/2020 0800 Gross per 24 hour  Intake 2325.92 ml  Output 650 ml  Net 1675.92 ml       Labs: Basic Metabolic Panel: Recent Labs  Lab 11/19/20 0328 11/19/20 1945 11/20/20 0441  NA 136 136 136  K 6.3* 3.2* 3.7  CL 101 100 99  CO2 21* 27 24   GLUCOSE 129* 105* 103*  BUN 110* 48* 62*  CREATININE 3.40* 1.81* 2.34*  CALCIUM 9.3 8.1* 8.4*  PHOS 8.6* 4.1 6.2*   Liver Function Tests: Recent Labs  Lab 11/19/20 0328 11/19/20 1945 11/20/20 0441  ALBUMIN 2.1* 2.0* 2.0*   No results for input(s): LIPASE, AMYLASE in the last 168 hours. No results for input(s): AMMONIA in the last 168 hours. CBC: Recent Labs  Lab 11/16/20 0352 11/17/20 0430 11/18/20 0440 11/19/20 0204 11/20/20 0441  WBC 19.8* 14.8* 19.8* 22.8* 13.7*  NEUTROABS 14.2* 11.4* 14.7* 18.7* 9.9*  HGB 9.3* 8.6* 8.3* 8.1* 8.1*  HCT 29.3* 27.6* 26.9* 25.6* 24.7*  MCV 102.1* 103.4* 103.5* 101.6* 100.0  PLT 173 90* 111* 125* 118*   Cardiac Enzymes: No results for input(s): CKTOTAL, CKMB, CKMBINDEX, TROPONINI in the last 168 hours. CBG: Recent Labs  Lab 11/19/20 1532 11/19/20 2012 11/19/20 2341 11/20/20 0409 11/20/20 0757  GLUCAP 103* 95 106* 94 110*    Iron Studies: No results for input(s): IRON, TIBC, TRANSFERRIN, FERRITIN in the last 72 hours. Studies/Results: DG Abd Portable 1V  Result Date: 11/19/2020 CLINICAL DATA:  Evaluate feeding tube placement. EXAM: PORTABLE ABDOMEN - 1 VIEW COMPARISON:  11/14/2020 FINDINGS: The tip of the feeding tube is not significantly changed in remains at the level of the GE junction. Bowel gas pattern is unremarkable. IMPRESSION: Feeding tube tip remains at the GE junction. Electronically Signed  By: Kerby Moors M.D.   On: 11/19/2020 12:19    Medications: Infusions: . sodium chloride 100 mL/hr at 11/20/20 0800  . dexmedetomidine (PRECEDEX) IV infusion 0.2 mcg/kg/hr (11/20/20 0800)  . feeding supplement (PIVOT 1.5 CAL) 1,000 mL (11/19/20 0012)  . sodium chloride      Scheduled Medications: . albuterol  2.5 mg Nebulization QID  . chlorhexidine gluconate (MEDLINE KIT)  15 mL Mouth Rinse BID  . Chlorhexidine Gluconate Cloth  6 each Topical Q0600  . darbepoetin (ARANESP) injection - DIALYSIS  100 mcg Intravenous Q  Tue-HD  . famotidine  20 mg Per Tube QHS  . feeding supplement (PROSource TF)  90 mL Per Tube 6 X Daily  . heparin injection (subcutaneous)  5,000 Units Subcutaneous Q8H  . linagliptin  5 mg Per Tube Daily  . mouth rinse  15 mL Mouth Rinse 10 times per day  . methylPREDNISolone (SOLU-MEDROL) injection  40 mg Intravenous Q24H  . sodium chloride flush  10-40 mL Intracatheter Q12H    have reviewed scheduled and prn medications.  Physical Exam: General: Has a tracheostomy, on vent, not in distress Heart:RRR, s1s2 nl Lungs: Coarse breath sound bilateral Abdomen:soft,  non-distended Extremities:No LE edema Dialysis Access: IJ temporary HD catheter  Adric Wrede Prasad Tomekia Helton 11/20/2020,8:57 AM  LOS: 31 days

## 2020-11-20 NOTE — Progress Notes (Addendum)
NAME:  Jackson Figueroa, MRN:  [id number], DOB:  [birth year], LOS: 58 ADMISSION DATE:  10/20/2020, CONSULTATION DATE:  11/29 REFERRING MD:  Sabra Heck, CHIEF COMPLAINT:  ARDS COVID 32   Brief History   59 year old male with no significant past medical history and a positive COVID-19 test on 11/26 presented to [facility] with profound hypoxemia on 11/28.  Intubated immediately upon arrival found to be in ARDS. Transferred to [facility] hospital for ongoing ICU care  11/29.  Past Medical History  Obesity  Significant Hospital Events   11/28 Presented to [facility] ER, intubated for hypoxemia, empiric abx, shock on levo. COVID Rx 11/29 Persistent shock, central line placed, ARDS protocol continued, proning initiated 11/30 Pressor requirements continued, NMB initiated to facilitate ventilation tube feeds started, initiated proning 12/01 Stopping NMB, proning positioning protocol discontinued, free water for rising creatinine and sodium 12/02 PEEP trending down.  Renal function improved. Changed to versed from prop gtt d/t rising triglycerides  12/03 Remdesivir completed. Peep/FIO2 stable. Free water adjusted. Solumedrol reduced, fentanyl to Dilaudid, adding Oxycodone  12/04 Dyssynchrony, given paralytic overnight, changed to ketamine; vomited / aspirated, restarted paralytics 12/06 Proned, under NMB 12/07 Proning, NMB continued. Switched antibiotics for stenotrophomonas 12/13 Stopped oxycodone, added dilaudid oral, continuous nimbex infusion to prn, palliative care consulted 12/14 Changed to pressure control 12/15 Worsening vent synchrony after clamping OG tube 12/18 added reglan IV for ? Ileus developing  12/18 back on paralytics for "guppy breathing/ air stacking" on PCV ? ,mucus plug  12/19  Back on PCV and off paralytics  12/20 having intermittent fevers, atrial fibrillation with some ST changes.  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 Started on minocycline per ID 12/22 stable decreased steroid dosing. Clonazepam added 12/23 trach placed. Versed off. Changed dilaudid from 4mg -.3mg , added clonidine and increased depakote. Added midodrine. OGT out during surg. Post-pyloric dislodged. Tip in stomach. Attempting trickle feeds w/ reglan   12/24 NG tube placed by IR 12/25 Dilaudid weaned to off, tolerated trach collar 12/26 tolerated weaning for about 4 hours on trach collar Consults:   Nephrology 12/14 Palliative 12/13 ID signed off 12/17  Procedures:   ETT 11/28 >>trach 12/23 Bates CVL 11/29 >> 12/9 R PICC 12/9 >>  R IJ HD Cath 12/17 >>  Significant Diagnostic Tests:   ECHO 11/29 >> LVEF 50-55%, mild LVH, RV systolic function mildly reduced. Mild dilation of ascending aorta 21mm. LE Venous Duplex 12/8 >> negative for DVT   Micro Data:   COVID 11/28 >> Positive  Influenza A/B 11/28 >> negative  UC 11/28 >> negative  BCx2 11/28 >> negative  Tracheal aspirate 12/5 >> Stenotrophomonas maltophilia >> S-bactrim, levofloxacin UC 12/11 >> negative  Tracheal aspirate 12/11 >> Stenotrophomonas maltophilia >> S-bactrim, levofloxacin  BCx2 12/11 >> negative  Trach 12/ 19 >>> multidrug resistance stenotrophomonas  Antimicrobials/covid rx:   CTX 11/28 > 11/29 Azithromycin 11/28 > 11/29 Remdesivir 11/28 > 12/2 Prednisone 11/28 >  Baricitinib 12/4 > 12/5 Vanco 12/5 >> 12/7, 12/11 x1 Cefepime 12/5 >> 12/7, 12/11 >> 12/13 Levaquin 12/7 >> 12/16 Minocycline 12/21>>>  Interim history/subjective:  More awake, able to not, 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  Objective   Blood pressure 104/68, pulse 99, temperature 98.7 F (37.1 C), temperature source Axillary, resp. rate (!) 22, height 6' (1.829 m), weight 108.3 kg, SpO2 99 %.  Vent Mode:  PCV FiO2 (%):  [30 %-35 %] 30 % Set Rate:  [15 bmp] 15 bmp PEEP:  [5 cmH20] 5 cmH20 Plateau Pressure:  [19 cmH20-20 cmH20] 20 cmH20   Intake/Output Summary (Last 24 hours) at 11/20/2020 2505 Last data filed at 11/20/2020 0600 Gross per 24 hour  Intake 2052.24 ml  Output 650 ml  Net 1402.24 ml   Filed Weights   11/16/20 0500 11/18/20 0446 11/19/20 0500  Weight: 109.9 kg 109.9 kg 108.3 kg    Examination: Constitutional: ill appearing man on vent  Eyes: able to track, pupils equal Ears, nose, mouth, and throat: trach in place, less secretions surrounding Cardiovascular: RRR, ext warm Respiratory: diminished, triggering vent Gastrointestinal: soft, hypoactive BS Skin: No rashes, normal turgor Neurologic: cannot get him to move ext Psychiatric: RASS 0  Labs looks better: Cr/WBC/BUN etc  Resolved Hospital Problem list   Post trach bleeding  Assessment & Plan:   ARDS due to COVID-19 pneumonia Profound deconditioning Metabolic encephalopathy- combination ICU delirium, medication effects, this plus deconditioning remain large barrier to vent liberation; improved with lightening sedation Multifactorial ATN- now 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- on minocycline day 6 Thrombocytopenia- improving, HIT neg Borderline Bps WBC up- question ongoing sepsis, question trach site infection Chronic steroids- unclear why still on  -IR PEG consult -Work toward [employer] trials -Minocycline day 7/7 -iHD as tolerated by Bps -PT/OT evals now that he is awake following commands -Will need months of rehab to regain any functionality provided no further complications  Best practice (evaluated daily)  Diet: Continue tube feeds Pain/Anxiety/Delirium protocol (if indicated): as above VAP protocol (if indicated): yes DVT prophylaxis: heparin sq  GI prophylaxis: famotidine Glucose control: off Mobility: bed rest Last date of  multidisciplinary goals of care discussion 12/29 with sister.   Family and staff present nurse Summary of discussion: hope for as much functional recovery as possible Follow up goals of care discussion due: 11/28/19 Code Status: full code  Disposition: ICU   Patient critically ill due to COVID ARDS, metabolic encephalopathy Interventions to address this today sedation and vent weaning Risk of deterioration without these interventions is high  I personally spent 32 minutes providing critical care not including any separately billable procedures  Myrla Halsted MD [facility] 11/20/2020 7:33 AM Personal pager: [phone] If unanswered, please page CCM On-call: #[phone]

## 2020-11-20 NOTE — Progress Notes (Signed)
Patient transported from 2M15 to CT and back with no complications noted.

## 2020-11-20 NOTE — Consult Note (Signed)
Chief Complaint: Patient was seen in consultation today for percutaneous gastrostomy placement.  Referring Physician(s): Ina Homes  Supervising Physician: Aletta Edouard  Patient Status: [facility] - In-pt  History of Present Illness: Jackson Figueroa is a 59 y.o. male with a past medical history significant for recent COVID-19 infection who presented to [facility] ED on 11/28 due to respiratory distress secondary to COVID-19. He was immediately intubated and subsequently developed ARDS. He eventually required tracheostomy placement on 12/23. IR has been asked to place a gastrostomy tube for ongoing nutritional support.   Patient seen in ICU, opens eyes to first name but does not track or follow commands. No family/staff at bedside. Discussed procedure via phone with patient's sister Neoma Laming who states she discussed g-tube placement with primary team as well as another family member who is a Immunologist and they feel this is the best for Jackson Figueroa right now. She is agreeable for [place] to proceed with g-tube placement.   Past Medical History:  Diagnosis Date  . Obesity     Past Surgical History:  Procedure Laterality Date  . TRACHEOSTOMY TUBE PLACEMENT N/A 11/14/2020   Procedure: TRACHEOSTOMY;  Surgeon: Melida Quitter, MD;  Location: [facility] ORS;  Service: ENT;  Laterality: N/A;    Allergies: Patient has no known allergies.  Medications: Prior to Admission medications   Medication Sig Start Date End Date Taking? Authorizing Provider  Multiple Vitamin (MULTIVITAMIN WITH MINERALS) TABS tablet Take 1 tablet by mouth daily.   Yes [provider]     History reviewed. No pertinent family history.  Social History   Socioeconomic History  . Marital status: Married    Spouse name: Not on file  . Number of children: Not on file  . Years of education: Not on file  . Highest education level: Not on file  Occupational History  . Not on file  Tobacco Use  . Smoking status: Never Smoker  . Smokeless  tobacco: Never Used  Vaping Use  . Vaping Use: Never used  Substance and Sexual Activity  . Alcohol use: Yes    Comment: occasional wine  . Drug use: Never  . Sexual activity: Not on file  Other Topics Concern  . Not on file  Social History Narrative  . Not on file   Social Determinants of Health   Financial Resource Strain: Not on file  Food Insecurity: Not on file  Transportation Needs: Not on file  Physical Activity: Not on file  Stress: Not on file  Social Connections: Not on file     Review of Systems: A 12 point ROS discussed and pertinent positives are indicated in the HPI above.  All other systems are negative.  Review of Systems  Unable to perform ROS: Mental status change    Vital Signs: BP (!) 94/59 (BP Location: Left Arm)   Pulse 87   Temp 98.6 F (37 C) (Axillary)   Resp (!) 27   Ht 6' (1.829 m)   Wt 238 lb 12.1 oz (108.3 kg)   SpO2 100%   BMI 32.38 kg/m   Physical Exam Vitals and nursing note reviewed.  Constitutional:      General: He is not in acute distress.    Appearance: He is ill-appearing.  HENT:     Head: Normocephalic.  Cardiovascular:     Rate and Rhythm: Normal rate and regular rhythm.  Pulmonary:     Comments: (+) trach Coarse breath sounds bilaterally Abdominal:     General: There is no distension.  Palpations: Abdomen is soft.  Skin:    General: Skin is warm and dry.  Neurological:     Mental Status: He is alert.      MD Evaluation Airway:  (tracheostomy) Heart: WNL Abdomen: WNL Chest/ Lungs: WNL ASA  Classification: 3 Mallampati/Airway Score:  (tracheostomy)   Imaging: CT ABDOMEN WO CONTRAST  Result Date: 11/20/2020 CLINICAL DATA:  Evaluate anatomy prior to potential percutaneous gastrostomy tube placement. EXAM: CT ABDOMEN WITHOUT CONTRAST TECHNIQUE: Multidetector CT imaging of the abdomen was performed following the standard protocol without IV contrast. COMPARISON:  Chest radiograph-11/17/2020 FINDINGS:  The lack of intravenous contrast limits the ability to evaluate solid abdominal organs. Lower chest: Limited visualization of the lower thorax demonstrates right basilar subpleural consolidative opacities, interseptal thickening and bronchiectasis within the imaged lung bases and right middle lobe. No pleural effusion. Borderline cardiomegaly. Trace amount of pericardial fluid, presumably physiologic. There is diffuse decreased attenuation of the intra cardiac blood pool suggestive of anemia. Tip of central venous catheter terminates within the superior aspect of the right atrium. Hepatobiliary: Normal hepatic contour. Normal noncontrast appearance of the gallbladder given degree of distention. No radiopaque gallstones. No ascites. Pancreas: Normal noncontrast appearance of the pancreas. Spleen: Borderline splenomegaly with the spleen measuring 13.2 cm in length. Adrenals/Urinary Tract: Punctate nonobstructing stones are seen within the inferior pole of the kidneys bilaterally (coronal images 58 and 59, series 5). No urinary obstruction. Dominant approximately 2.7 cm hypoattenuating (7 Hounsfield unit) exophytic lesion arising inferolateral aspect of the right kidney is incompletely characterized on the present examination though likely represents a renal cyst. Indeterminate left-sided renal lesions are identified with dominant hyperdense left-sided renal lesion measuring 3.6 x 2.8 cm (coronal image 50, series 5), potentially representative of a hyperdense cyst though incompletely characterized on the present examination. Normal noncontrast appearance of the bilateral adrenal glands. The urinary bladder was not imaged. Stomach/Bowel: While there is a moderate to large amount of mesenteric fat between the anterior wall of the stomach and ventral wall of the upper abdomen (measuring approximately 7.5 cm - axial image 20, series 3), there is no definitive interposition of either the hepatic parenchyma or the transverse  colon and the percutaneous window will likely be improved with gastric insufflation. Enteric contrast is seen throughout the transverse colon. No evidence of enteric obstruction. No hiatal hernia. Vascular/Lymphatic: Scattered atherosclerotic plaque within a normal caliber abdominal aorta. No bulky retroperitoneal or mesenteric lymphadenopathy on this noncontrast examination. Other: There is a minimal amount of subcutaneous edema about the midline of the low back. Musculoskeletal: No acute or aggressive osseous abnormalities. Stigmata of dish within the lower thoracic spine. IMPRESSION: 1. Gastric anatomy amenable to attempted percutaneous gastrostomy tube placement as indicated. 2. Right basilar subpleural consolidative opacities atelectasis versus infiltrate/aspiration. 3. Interstitial thickening and bronchiectasis within the imaged lung bases, nonspecific with broad differential considerations including atypical infection and fibrosis. Further evaluation with dedicated chest CT could be performed as indicated. 4. Bilateral nonobstructing nephrolithiasis. 5. Indeterminate left-sided renal lesions, potentially representative of hypertension renal cysts, though incompletely characterized on the present examination. Further evaluation with nonemergent abdominal MRI could be performed as indicated. 6. Aortic Atherosclerosis (ICD10-I70.0). Electronically Signed   By: Sandi Mariscal M.D.   On: 11/20/2020 09:07   DG Abd 1 View  Result Date: 11/14/2020 CLINICAL DATA:  Feeding tube placement EXAM: ABDOMEN - 1 VIEW COMPARISON:  None. FINDINGS: Soft feeding tube tip is at the gastroesophageal junction. Bowel gas pattern unremarkable. IMPRESSION: Soft feeding tube tip at the  gastroesophageal junction. Electronically Signed   By: Nelson Chimes M.D.   On: 11/14/2020 14:24   DG Abd 1 View  Result Date: 11/14/2020 CLINICAL DATA:  NG tube placement EXAM: ABDOMEN - 1 VIEW COMPARISON:  11/11/2020 FINDINGS: Weighted feeding  tube terminates in the distal gastric antrum. Nonobstructive bowel gas pattern. Degenerative changes of the lumbar spine. IMPRESSION: Weighted feeding tube terminates in the distal gastric antrum. Electronically Signed   By: Julian Hy M.D.   On: 11/14/2020 09:42   DG Abd 1 View  Result Date: 11/01/2020 CLINICAL DATA:  Nasogastric tube placement. EXAM: ABDOMEN - 1 VIEW COMPARISON:  Same day. FINDINGS: The bowel gas pattern is normal. Distal tip of nasogastric tube is seen in proximal stomach. Distal tip of feeding tube is either in the distal stomach or proximal duodenum. No radio-opaque calculi or other significant radiographic abnormality are seen. IMPRESSION: Distal tip of feeding tube seen in distal stomach or proximal duodenum. Distal tip of nasogastric tube seen in proximal stomach. Electronically Signed   By: Marijo Conception M.D.   On: 11/01/2020 18:51   DG Abd 1 View  Result Date: 11/01/2020 CLINICAL DATA:  Feeding tube. EXAM: ABDOMEN - 1 VIEW COMPARISON:  10/31/2020 and prior FINDINGS: Weighted enteric tube tip overlies the distal gastric body. Non weighted enteric tube tip and side hole overlie the gastric body. Paucity of upper abdominal bowel gas. Bilateral pulmonary opacities. Multilevel spondylosis. IMPRESSION: Weighted and non weighted enteric tube tips overlie the gastric body. Bilateral pulmonary opacities, better demonstrated on recent chest radiograph. Electronically Signed   By: Primitivo Gauze M.D.   On: 11/01/2020 16:14   DG Abd 1 View  Result Date: 10/31/2020 CLINICAL DATA:  Ileus EXAM: ABDOMEN - 1 VIEW COMPARISON:  None. FINDINGS: Orogastric or nasogastric tube enters the stomach with its tip in the midportion. Bowel gas pattern does not show evidence of ileus, obstruction or detectable free air. No abnormal soft tissue calcifications. Ordinary spinal curvature with mild degenerative changes. Bilateral pulmonary infiltrates as shown at chest radiography. IMPRESSION:  Orogastric or nasogastric tube enters the stomach with its tip in the midportion. Gas pattern unremarkable. Electronically Signed   By: Nelson Chimes M.D.   On: 10/31/2020 10:34   US RENAL  Result Date: 11/05/2020 CLINICAL DATA:  Acute kidney injury EXAM: RENAL / URINARY TRACT ULTRASOUND COMPLETE COMPARISON:  None. FINDINGS: Right Kidney: Renal measurements: 15.0 x 5.8 x 5.6 cm = volume: 254 mL. Echogenicity within normal limits. No mass or hydronephrosis visualized. Left Kidney: Renal measurements: 14.5 x 6.8 x 6.3 cm = volume: 321 mL. Echogenicity within normal limits. No mass or hydronephrosis visualized. Bladder: Not identified possibly empty. Other: Limited by patient size. IMPRESSION: Normal kidneys.  Bladder not visualized. Electronically Signed   By: Franchot Gallo M.D.   On: 11/05/2020 15:35   DG Chest Port 1 View  Result Date: 11/17/2020 CLINICAL DATA:  Acute respiratory failure. EXAM: PORTABLE CHEST 1 VIEW COMPARISON:  11/12/2020 FINDINGS: 0424 hours. Low lung volumes. The cardio pericardial silhouette is enlarged. Diffuse interstitial and patchy basilar airspace disease is similar to prior. Feeding tube tip is in the stomach. Tracheostomy tube is new in the interval. Right PICC line tip is probably in the region of the innominate vein confluence, but catheter is not well seen over the mediastinum. Right IJ central line tip projects over the expected location of the low SVC given the leftward patient rotation on this study. IMPRESSION: 1. Interval tracheostomy tube placement. 2. Low  volume film with persistent diffuse interstitial and patchy basilar airspace disease. Electronically Signed   By: Misty Stanley M.D.   On: 11/17/2020 05:03   DG Chest Port 1 View  Addendum Date: 11/12/2020   ADDENDUM REPORT: 11/12/2020 03:42 ADDENDUM: A right upper extremity PICC tip extends towards midline but without visible extension inferiorly into the SVC. Possibly terminating within the brachiocephalic vein  though difficult to fully assess given patient rotation. These results will be called to the ordering clinician or representative by the Radiologist Assistant, and communication documented in the PACS or [employer]. Electronically Signed   By: Lovena Le M.D.   On: 11/12/2020 03:42   Result Date: 11/12/2020 CLINICAL DATA:  PICC line placement, respiratory failure EXAM: PORTABLE CHEST 1 VIEW COMPARISON:  Radiograph 11/10/2020 FINDINGS: Patient is rotated in a steep right anterior obliquity superimposing portion of the mediastinum over the left lung and narrowing the right lung window. *A right IJ approach dual lumen dialysis catheter tip terminates in the expected location of the superior cavoatrial junction/right atrium. *Endotracheal tube tip is low in the trachea, approximately 1 cm from the expected location of the carina. Consider retraction 3-4 cm to the mid trachea. *Paired transesophageal tubes terminate below the margins of imaging, beyond the GE junction. *Additional telemetry leads and support devices overlie the chest. Persistent extensive heterogeneous interstitial and consolidative opacities throughout both lungs. No pneumothorax. No effusion. Enlarged cardiac silhouette is likely stable from prior though difficult to fully assess given accentuation by rotation. No acute osseous or soft tissue abnormality. IMPRESSION: 1. Patient rotated in a steep right anterior obliquity superimposing portion of the mediastinum over the left lung and narrowing the right lung window. 2. Endotracheal tube tip is low in the trachea, approximately 1 cm from the expected location of the carina. Consider retraction 3-4 cm to the mid trachea. 3. Right IJ catheter tip terminates at the superior cavoatrial junction/right atrium. 4. Persistent extensive heterogeneous interstitial and consolidative opacities throughout both lungs. Electronically Signed: By: Lovena Le M.D. On: 11/12/2020 02:25   DG CHEST PORT 1  VIEW  Result Date: 11/10/2020 CLINICAL DATA:  Acute respiratory distress. EXAM: PORTABLE CHEST 1 VIEW COMPARISON:  November 08, 2020 FINDINGS: There is stable endotracheal tube, nasogastric tube, right internal jugular venous catheter and right-sided PICC line positioning. Stable marked severity bilateral multifocal infiltrates are seen. There is no evidence of a pleural effusion or pneumothorax. The cardiac silhouette is mildly enlarged and unchanged in size. The visualized skeletal structures are unremarkable. IMPRESSION: Bilateral multifocal infiltrates, unchanged in severity when compared to the prior study. Electronically Signed   By: Virgina Norfolk M.D.   On: 11/10/2020 02:25   DG Chest Port 1 View  Result Date: 11/08/2020 CLINICAL DATA:  Hypoxia.  COVID-19 positive. EXAM: PORTABLE CHEST 1 VIEW COMPARISON:  November 07, 2020 FINDINGS: Endotracheal tube tip is 3.1 cm above the carina. Enteric tube tips are below the diaphragm. Central catheter tip is in the right atrium. No pneumothorax. Patchy airspace opacity bilaterally remains without change. Heart is prominent with pulmonary vascularity normal, stable. No adenopathy. No bone lesions. IMPRESSION: Tube and catheter positions as described without pneumothorax. Patchy airspace opacity bilaterally is likely due to underlying atypical organism pneumonia. Stable cardiac prominence. Overall appearance unchanged from 1 day prior. Electronically Signed   By: Lowella Grip III M.D.   On: 11/08/2020 08:05   DG CHEST PORT 1 VIEW  Result Date: 11/07/2020 CLINICAL DATA:  Central line placement. EXAM: PORTABLE CHEST 1  VIEW COMPARISON:  Radiograph yesterday. FINDINGS: Tip of the right internal jugular dual lumen catheter at the atrial caval junction. No visualized pneumothorax. Patient is significantly rotated. Endotracheal tube tip at the level of the clavicular heads. Enteric tube tip below the diaphragm not included in the field of view. No  significant change in heterogeneous bilateral lung opacities. Stable heart size and mediastinal contours. IMPRESSION: 1. Tip of the right internal jugular dual lumen catheter at the atrial caval junction. No visualized pneumothorax. 2. Stable heterogeneous bilateral lung opacities consistent with COVID pneumonia. 3. Endotracheal tube and enteric tube remain in place. Electronically Signed   By: Keith Rake M.D.   On: 11/07/2020 18:01   DG CHEST PORT 1 VIEW  Result Date: 11/06/2020 CLINICAL DATA:  Respiratory failure EXAM: PORTABLE CHEST 1 VIEW COMPARISON:  11/06/2020, 11/05/2020, 11/02/2020 FINDINGS: Endotracheal tube tip is just above the carina. Esophageal tube tip below the diaphragm but incompletely visualized. Slightly improved aeration at the right base. Extensive pulmonary infiltrates otherwise grossly unchanged. Stable cardiomediastinal silhouette. No pneumothorax. IMPRESSION: 1. Endotracheal tube tip just above the carina. Esophageal tube tip below the diaphragm but incompletely visualized. 2. Slightly improved aeration at the right base. Extensive pulmonary infiltrates otherwise grossly unchanged. Electronically Signed   By: Donavan Foil M.D.   On: 11/06/2020 18:42   DG Chest Port 1 View  Result Date: 11/06/2020 CLINICAL DATA:  59 year old male COVID-80. EXAM: PORTABLE CHEST 1 VIEW COMPARISON:  Portable chest 11/05/2020 and earlier. FINDINGS: Portable AP semi upright view at 0505 hours. Endotracheal tube tip at the level the clavicles. Enteric tube courses to the abdomen, tip not included. Right upper extremity PICC line in place. Another oblique tube projects over the right upper chest as before, etiology unclear. Lower lung volumes. Stable cardiac size and mediastinal contours. Coarse and confluent perihilar, peripheral left lung, and bi basilar predominant pulmonary opacity. Ventilation mildly improved compared to 11/02/2020. No pneumothorax or pleural effusion is evident. Stable  visualized osseous structures. Paucity of bowel gas in the upper abdomen. IMPRESSION: 1. Stable lines and tubes. 2. Lower lung volumes with bilateral COVID-19 pneumonia mildly improved compared to 11/02/2020. 3. No new cardiopulmonary abnormality. Electronically Signed   By: Genevie Ann M.D.   On: 11/06/2020 07:28   DG Chest Port 1 View  Result Date: 11/05/2020 CLINICAL DATA:  Respiratory failure. EXAM: PORTABLE CHEST 1 VIEW COMPARISON:  11/02/2020. FINDINGS: Endotracheal tube, NG tube, right PICC line stable position. Stable cardiomegaly. Prominent multifocal bilateral pulmonary infiltrates/edema again noted without interim change. No pleural effusion or pneumothorax. IMPRESSION: 1. Lines and tubes stable position. 2. Stable cardiomegaly. 3. Prominent multifocal bilateral pulmonary infiltrates/edema again noted without interim change. Electronically Signed   By: Marcello Moores  Register   On: 11/05/2020 06:06   DG CHEST PORT 1 VIEW  Result Date: 11/02/2020 CLINICAL DATA:  Hypoxia. EXAM: PORTABLE CHEST 1 VIEW COMPARISON:  October 31, 2020. FINDINGS: The heart size and mediastinal contours are within normal limits. Endotracheal and nasogastric tubes are unchanged in position. Right-sided PICC line is unchanged. Stable bilateral lung opacities are noted consistent with multifocal pneumonia. The visualized skeletal structures are unremarkable. IMPRESSION: Stable support apparatus. Stable bilateral lung opacities consistent with multifocal pneumonia. Electronically Signed   By: Marijo Conception M.D.   On: 11/02/2020 10:15   DG CHEST PORT 1 VIEW  Result Date: 10/31/2020 CLINICAL DATA:  Hypoxia EXAM: PORTABLE CHEST 1 VIEW COMPARISON:  10/28/2020 FINDINGS: Endotracheal tube, right central line and NG tube remain in place, unchanged. Patchy  bilateral airspace disease, improving on the right and worsening on the left. Heart is normal size. No effusions or pneumothorax. IMPRESSION: Patchy bilateral airspace disease,  improving on the right and worsening on the left. Electronically Signed   By: Rolm Baptise M.D.   On: 10/31/2020 10:24   DG Chest Port 1 View  Result Date: 10/28/2020 CLINICAL DATA:  Acute respiratory distress syndrome due to COVID 19 virus EXAM: PORTABLE CHEST 1 VIEW COMPARISON:  10/27/2020 FINDINGS: Endotracheal tube in good position. Right jugular central venous catheter tip in the SVC unchanged. NG tube in the stomach unchanged. Diffuse bilateral airspace disease with progression in the lung bases, right greater than left. No significant pleural effusion. No pneumothorax IMPRESSION: Progression of bibasilar airspace disease right greater than left. Support lines unchanged in position. Electronically Signed   By: Franchot Gallo M.D.   On: 10/28/2020 08:05   DG Chest Port 1 View  Result Date: 10/27/2020 CLINICAL DATA:  Acute respiratory distress syndrome EXAM: PORTABLE CHEST 1 VIEW COMPARISON:  Chest x-rays dated 10/26/2020 and 10/25/2020 FINDINGS: Endotracheal tube appears well positioned with tip just above the level of the carina. RIGHT IJ central line appears adequately positioned with tip at the level of the mid SVC. Enteric tube passes below the diaphragm. Patchy bilateral airspace opacities are not significantly changed. No new lung findings. No large pleural effusion or pneumothorax is seen. IMPRESSION: 1. Patchy bilateral airspace opacities, again most prominent at the RIGHT lung base, overall not significantly changed, compatible with multifocal pneumonia. 2. Support apparatus appears appropriately positioned. Electronically Signed   By: Franki Cabot M.D.   On: 10/27/2020 05:46   DG Chest Port 1 View  Result Date: 10/26/2020 CLINICAL DATA:  Adult respiratory distress syndrome EXAM: PORTABLE CHEST 1 VIEW COMPARISON:  Chest x-rays dated 10/25/2020 and 10/20/2020 FINDINGS: Endotracheal tube appears adequately positioned with tip above the level of the carina. Enteric tube passes below the  diaphragm. Heart size and mediastinal contours appear stable. Increasing airspace opacity within the RIGHT lower lobe. Stable patchy airspace opacities overlying the LEFT hilum and at the LEFT lung base. No pneumothorax is seen. No convincing pleural effusion. IMPRESSION: 1. Worsened airspace opacity at the RIGHT lung base, pneumonia versus asymmetric pulmonary edema. 2. Support apparatus appears appropriately positioned. Electronically Signed   By: Franki Cabot M.D.   On: 10/26/2020 05:04   DG CHEST PORT 1 VIEW  Result Date: 10/25/2020 CLINICAL DATA:  59 year old male with respiratory distress. EXAM: PORTABLE CHEST 1 VIEW COMPARISON:  Chest radiograph dated 10/25/2020. FINDINGS: Endotracheal tube with tip approximately 4 cm above the carina. Enteric tube extends below the diaphragm with tip beyond the inferior margin of the image. Right IJ central venous line in similar position. Bilateral interstitial and hazy airspace opacities similar or slightly progressed compared to the prior radiograph. No large pleural effusion or pneumothorax. Stable cardiac silhouette. No acute osseous pathology. IMPRESSION: Bilateral interstitial and hazy airspace opacities similar or slightly progressed compared to the prior radiograph. Electronically Signed   By: Anner Crete M.D.   On: 10/25/2020 15:56   DG Chest Port 1 View  Result Date: 10/25/2020 CLINICAL DATA:  Respiratory failure, ARDS EXAM: PORTABLE CHEST 1 VIEW COMPARISON:  10/24/2020 FINDINGS: Endotracheal tube seen 4.7 cm above the carina. Nasogastric tube extends into the upper abdomen beyond the margin of the examination. Right internal jugular central venous catheter tip noted within the superior cavoatrial junction. Pulmonary insufflation remain stable with mild elevation of the left hemidiaphragm. Multifocal pulmonary  infiltrates are again identified appear slightly improved at the left lung base and slightly progressive at the right lung base, likely  infectious or inflammatory in nature. No pneumothorax or pleural effusion. Cardiac size is mildly enlarged. IMPRESSION: Waxing and waning pulmonary infiltrates, likely infectious or inflammatory. Stable support lines and tubes. Electronically Signed   By: Fidela Salisbury MD   On: 10/25/2020 05:46   DG Chest Port 1 View  Result Date: 10/24/2020 CLINICAL DATA:  ARDS. EXAM: PORTABLE CHEST 1 VIEW COMPARISON:  10/22/2020. FINDINGS: Endotracheal tube, NG tube, right IJ line in stable position. Heart size stable. Low lung volumes. Bilateral multifocal pulmonary infiltrates again noted without interim change. No prominent pleural effusion. No pneumothorax. IMPRESSION: 1. Lines and tubes in stable position. 2. Low lung volumes. Bilateral multifocal pulmonary infiltrates again noted without interim change. Electronically Signed   By: Marcello Moores  Register   On: 10/24/2020 06:21   DG CHEST PORT 1 VIEW  Result Date: 10/22/2020 CLINICAL DATA:  Hypoxia.  Reported COVID-19 positive. EXAM: PORTABLE CHEST 1 VIEW COMPARISON:  October 21, 2020 FINDINGS: Endotracheal tube tip is 5.5 cm above the carina. Nasogastric tube tip is below the diaphragm. No pneumothorax. There is patchy airspace opacity in the mid and lower lung regions and right upper lobe without consolidation. Heart is upper normal in size with pulmonary vascularity normal. No adenopathy. No bone lesions. IMPRESSION: Tube positions as described without pneumothorax. Persistent multifocal airspace opacity, likely representing multifocal atypical organism pneumonia. No appreciable new opacity. Stable cardiac silhouette. Electronically Signed   By: Lowella Grip III M.D.   On: 10/22/2020 13:43   DG CHEST PORT 1 VIEW  Result Date: 10/21/2020 CLINICAL DATA:  Status post central line placement. EXAM: PORTABLE CHEST 1 VIEW COMPARISON:  Same day. FINDINGS: Stable cardiomediastinal silhouette. Endotracheal and nasogastric tubes are unchanged in position. Right internal  jugular catheter is noted with tip in expected position of the SVC. No pneumothorax or pleural effusion is noted. Stable bilateral lung opacities are noted concerning for multifocal pneumonia. Bony thorax is unremarkable. IMPRESSION: Stable support apparatus. Stable bilateral lung opacities concerning for multifocal pneumonia. No pneumothorax is noted. Electronically Signed   By: Marijo Conception M.D.   On: 10/21/2020 11:54   DG Abd Portable 1V  Result Date: 11/19/2020 CLINICAL DATA:  Evaluate feeding tube placement. EXAM: PORTABLE ABDOMEN - 1 VIEW COMPARISON:  11/14/2020 FINDINGS: The tip of the feeding tube is not significantly changed in remains at the level of the GE junction. Bowel gas pattern is unremarkable. IMPRESSION: Feeding tube tip remains at the GE junction. Electronically Signed   By: Kerby Moors M.D.   On: 11/19/2020 12:19   DG Abd Portable 1V  Result Date: 11/11/2020 CLINICAL DATA:  Ileus EXAM: PORTABLE ABDOMEN - 1 VIEW COMPARISON:  11/06/2020 and prior. FINDINGS: Weighted enteric tube tip overlies the proximal duodenum. Non weighted enteric tube tip and side hole overlie the gastric body. Paucity of abdominal bowel gas. IMPRESSION: Non weighted enteric tube tip and side hole overlie the gastric body. Weighted enteric tube tip overlies the proximal duodenum. Electronically Signed   By: Primitivo Gauze M.D.   On: 11/11/2020 11:09   DG Abd Portable 1V  Result Date: 11/06/2020 CLINICAL DATA:  Nasogastric tube placement EXAM: PORTABLE ABDOMEN - 1 VIEW COMPARISON:  November 01, 2020 FINDINGS: Nasogastric tube tip and side port are in the stomach. Feeding tube tip is not seen but is felt to be in the duodenum. Paucity of bowel gas noted. No  evident free air. Bibasilar atelectasis. IMPRESSION: Nasogastric tube tip and side port in stomach. Feeding tube tip not seen but likely in duodenum. Relative paucity of bowel gas potentially may indicate a degree of ileus or enteritis. Bowel  obstruction less likely. No free air evident on supine examination. Electronically Signed   By: Lowella Grip III M.D.   On: 11/06/2020 11:01   DG Abd Portable 1V  Result Date: 10/31/2020 CLINICAL DATA:  NG tube placement. EXAM: PORTABLE ABDOMEN - 1 VIEW COMPARISON:  Prior same day abdominal radiograph. FINDINGS: Stable positioning of non weighted enteric tube. Interval placement of weighted enteric tube with tip overlying the gastric antrum/pylorus. Paucity of abdominal bowel gas. Multilevel spondylosis. Bilateral pulmonary opacities. IMPRESSION: 1. Weighted enteric tube tip overlies the gastric antrum/pylorus. 2. Stable appearance of non weighted enteric tube. 3. Bilateral pulmonary opacities, better evaluated on same day chest radiograph. Electronically Signed   By: Primitivo Gauze M.D.   On: 10/31/2020 18:07   DG Naso G Tube Plc W/Fl W/Rad  Result Date: 11/15/2020 CLINICAL DATA:  Feeding tube placement EXAM: NASO G TUBE PLACEMENT WITH FL AND WITH RAD CONTRAST:  No contrast used FLUOROSCOPY TIME:  Fluoroscopy Time:  4.4 minutes Radiation Exposure Index (if provided by the fluoroscopic device): 110 mGy Number of Acquired Spot Images: 1 COMPARISON:  Radiograph 01/16/2020 FINDINGS: Patient's LEFT nare was topically anesthetized with viscous lidocaine. Feeding tube was advanced into the stomach. Multiple attempts were made to advance into the proximal duodenum unsuccessfully. Amplatz wire was utilized without success. Multiple attempts resulted in tube coiling in patient's mouth. Feeding tube was ultimately positioned in the stomach. Stylet removed. IMPRESSION: Feeding tube advanced to the stomach.  Stylet removed. Unsuccessful attempts to advance into duodenum. Electronically Signed   By: Suzy Bouchard M.D.   On: 11/15/2020 09:08   VAS [place] LOWER EXTREMITY VENOUS (DVT)  Result Date: 11/04/2020  Lower Venous DVT Study Indications: Concern for PE, elevated d-dimer, Covid-19.  Anticoagulation:  Heparin. Comparison Study: 10-30-2020 Prior LT lower venous study available. Performing Technologist: Darlin Coco RDMS  Examination Guidelines: A complete evaluation includes B-mode imaging, spectral Doppler, color Doppler, and power Doppler as needed of all accessible portions of each vessel. Bilateral testing is considered an integral part of a complete examination. Limited examinations for reoccurring indications may be performed as noted. The reflux portion of the exam is performed with the patient in reverse Trendelenburg.  +---------+---------------+---------+-----------+----------+--------------+ RIGHT    CompressibilityPhasicitySpontaneityPropertiesThrombus Aging +---------+---------------+---------+-----------+----------+--------------+ CFV      Full           Yes      Yes                                 +---------+---------------+---------+-----------+----------+--------------+ SFJ      Full                                                        +---------+---------------+---------+-----------+----------+--------------+ FV Prox  Full                                                        +---------+---------------+---------+-----------+----------+--------------+  FV Mid   Full                                                        +---------+---------------+---------+-----------+----------+--------------+ FV DistalFull                                                        +---------+---------------+---------+-----------+----------+--------------+ PFV      Full                                                        +---------+---------------+---------+-----------+----------+--------------+ POP      Full           Yes      Yes                                 +---------+---------------+---------+-----------+----------+--------------+ PTV      Full                                                         +---------+---------------+---------+-----------+----------+--------------+ PERO     Full                                                        +---------+---------------+---------+-----------+----------+--------------+   +---------+---------------+---------+-----------+----------+--------------+ LEFT     CompressibilityPhasicitySpontaneityPropertiesThrombus Aging +---------+---------------+---------+-----------+----------+--------------+ CFV      Full           Yes      Yes                                 +---------+---------------+---------+-----------+----------+--------------+ SFJ      Full                                                        +---------+---------------+---------+-----------+----------+--------------+ FV Prox  Full                                                        +---------+---------------+---------+-----------+----------+--------------+ FV Mid   Full                                                        +---------+---------------+---------+-----------+----------+--------------+  FV DistalFull                                                        +---------+---------------+---------+-----------+----------+--------------+ PFV      Full                                                        +---------+---------------+---------+-----------+----------+--------------+ POP      Full           Yes      Yes                                 +---------+---------------+---------+-----------+----------+--------------+ PTV      Full                                                        +---------+---------------+---------+-----------+----------+--------------+ PERO     Full                                                        +---------+---------------+---------+-----------+----------+--------------+     Summary: RIGHT: - There is no evidence of deep vein thrombosis in the lower extremity.  - No cystic structure found in  the popliteal fossa.  LEFT: - There is no evidence of deep vein thrombosis in the lower extremity.  - No cystic structure found in the popliteal fossa.  *See table(s) above for measurements and observations. Electronically signed by Ruta Hinds MD on 11/04/2020 at 49:45:42 PM.    Final    VAS [place] LOWER EXTREMITY VENOUS (DVT)  Result Date: 10/30/2020  Lower Venous DVT Study Indications: Edema.  Risk Factors: COVID 19 positive. Limitations: Poor ultrasound/tissue interface and patient immobility, patient positioning. Comparison Study: No prior studies. Performing Technologist: Oliver Hum RVT  Examination Guidelines: A complete evaluation includes B-mode imaging, spectral Doppler, color Doppler, and power Doppler as needed of all accessible portions of each vessel. Bilateral testing is considered an integral part of a complete examination. Limited examinations for reoccurring indications may be performed as noted. The reflux portion of the exam is performed with the patient in reverse Trendelenburg.  +-----+---------------+---------+-----------+----------+--------------+ RIGHTCompressibilityPhasicitySpontaneityPropertiesThrombus Aging +-----+---------------+---------+-----------+----------+--------------+ CFV  Full           Yes      Yes                                 +-----+---------------+---------+-----------+----------+--------------+   +---------+---------------+---------+-----------+----------+--------------+ LEFT     CompressibilityPhasicitySpontaneityPropertiesThrombus Aging +---------+---------------+---------+-----------+----------+--------------+ CFV      Full           Yes      Yes                                 +---------+---------------+---------+-----------+----------+--------------+  SFJ      Full                                                        +---------+---------------+---------+-----------+----------+--------------+ FV Prox  Full                                                         +---------+---------------+---------+-----------+----------+--------------+ FV Mid   Full                                                        +---------+---------------+---------+-----------+----------+--------------+ FV DistalFull                                                        +---------+---------------+---------+-----------+----------+--------------+ PFV      Full                                                        +---------+---------------+---------+-----------+----------+--------------+ POP      Full           Yes      Yes                                 +---------+---------------+---------+-----------+----------+--------------+ PTV      Full                                                        +---------+---------------+---------+-----------+----------+--------------+ PERO     Full                                                        +---------+---------------+---------+-----------+----------+--------------+     Summary: RIGHT: - No evidence of common femoral vein obstruction.  LEFT: - There is no evidence of deep vein thrombosis in the lower extremity. However, portions of this examination were limited- see technologist comments above.  - No cystic structure found in the popliteal fossa.  *See table(s) above for measurements and observations. Electronically signed by Lemar Livings MD on 10/30/2020 at 3:09:28 PM.    Final    ECHOCARDIOGRAM LIMITED  Result Date: 11/02/2020    ECHOCARDIOGRAM LIMITED REPORT   Patient Name:   ZAKI GERTSCH Date of Exam: 11/02/2020 Medical Rec #:  [id number]   Height:  72.0 in Accession #:    [id number]  Weight:       285.1 lb Date of Birth:  [birth year]    BSA:          2.477 m Patient Age:    59 years    BP:           153/73 mmHg Patient Gender: M           HR:           101 bpm. Exam Location:  Inpatient Procedure: Limited Echo, Limited Color Doppler and Cardiac Doppler  Indications:    Acute Respiratory Insufficiency 518.82 / R06.89  History:        Patient has prior history of Echocardiogram examinations, most                 recent 10/21/2020.  Sonographer:    Eulah Pont RDCS Referring Phys: [id number] Virl Axe CLARK IMPRESSIONS  1. Left ventricular ejection fraction, by estimation, is 55 to 60%. The left ventricle has normal function. The left ventricle has no regional wall motion abnormalities. There is mild concentric left ventricular hypertrophy. Left ventricular diastolic parameters are consistent with Grade I diastolic dysfunction (impaired relaxation). Elevated left atrial pressure.  2. Right ventricular systolic function is hyperdynamic. The right ventricular size is mildly enlarged. There is normal pulmonary artery systolic pressure.  3. Left atrial size was moderately dilated.  4. The mitral valve is normal in structure. No evidence of mitral valve regurgitation. No evidence of mitral stenosis.  5. The aortic valve is normal in structure. Aortic valve regurgitation is not visualized. No aortic stenosis is present.  6. The inferior vena cava is normal in size with greater than 50% respiratory variability, suggesting right atrial pressure of 3 mmHg. FINDINGS  Left Ventricle: Left ventricular ejection fraction, by estimation, is 55 to 60%. The left ventricle has normal function. The left ventricle has no regional wall motion abnormalities. The left ventricular internal cavity size was normal in size. There is  mild concentric left ventricular hypertrophy. Left ventricular diastolic parameters are consistent with Grade I diastolic dysfunction (impaired relaxation). Elevated left atrial pressure. Right Ventricle: The right ventricular size is mildly enlarged. No increase in right ventricular wall thickness. Right ventricular systolic function is hyperdynamic. There is normal pulmonary artery systolic pressure. Left Atrium: Left atrial size was moderately dilated. Right  Atrium: Right atrial size was normal in size. Pericardium: There is no evidence of pericardial effusion. Mitral Valve: The mitral valve is normal in structure. No evidence of mitral valve stenosis. Tricuspid Valve: The tricuspid valve is normal in structure. Tricuspid valve regurgitation is mild . No evidence of tricuspid stenosis. Aortic Valve: The aortic valve is normal in structure. Aortic valve regurgitation is not visualized. No aortic stenosis is present. Pulmonic Valve: The pulmonic valve was normal in structure. Pulmonic valve regurgitation is not visualized. No evidence of pulmonic stenosis. Aorta: The aortic root is normal in size and structure. Venous: The inferior vena cava is normal in size with greater than 50% respiratory variability, suggesting right atrial pressure of 3 mmHg. IAS/Shunts: No atrial level shunt detected by color flow Doppler. LEFT VENTRICLE PLAX 2D LVIDd:         4.80 cm  Diastology LVIDs:         3.00 cm  LV e' medial:    6.31 cm/s LV PW:         1.00 cm  LV E/e' medial:  13.0 LV IVS:  1.10 cm  LV e' lateral:   4.46 cm/s LVOT diam:     2.00 cm  LV E/e' lateral: 18.3 LV SV:         46 LV SV Index:   19 LVOT Area:     3.14 cm  RIGHT VENTRICLE RV S prime:     21.30 cm/s TAPSE (M-mode): 1.8 cm LEFT ATRIUM         Index LA diam:    4.80 cm 1.94 cm/m  AORTIC VALVE LVOT Vmax:   92.70 cm/s LVOT Vmean:  61.400 cm/s LVOT VTI:    0.148 m  AORTA Ao Root diam: 3.90 cm MITRAL VALVE MV Area (PHT): 3.91 cm    SHUNTS MV Decel Time: 194 msec    Systemic VTI:  0.15 m MV E velocity: 81.80 cm/s  Systemic Diam: 2.00 cm MV A velocity: 86.10 cm/s MV E/A ratio:  0.95 Ena Dawley MD Electronically signed by Ena Dawley MD Signature Date/Time: 11/02/2020/12:37:59 PM    Final    ECHOCARDIOGRAM LIMITED  Result Date: 10/21/2020    ECHOCARDIOGRAM LIMITED REPORT   Patient Name:   BRIX HAJEK Date of Exam: 10/21/2020 Medical Rec #:  [id number]   Height:       72.0 in Accession #:    [id number]   Weight:       264.6 lb Date of Birth:  07-25-1961    BSA:          2.400 m Patient Age:    30 years    BP:           98/70 mmHg Patient Gender: M           HR:           67 bpm. Exam Location:  Inpatient Procedure: Limited Echo, Limited Color Doppler and Cardiac Doppler Indications:    Shock [id number]  History:        Patient has no prior history of Echocardiogram examinations.                 COVID-19 Postive.  Sonographer:    Jonelle Sidle Dance Referring Phys: [id number] Barnes  1. Left ventricular ejection fraction, by estimation, is 50 to 55%. The left ventricle has low normal function. Left ventricular endocardial border not optimally defined to evaluate regional wall motion. There is mild left ventricular hypertrophy. Left ventricular diastolic parameters are indeterminate.  2. Right ventricular systolic function is mildly reduced. The right ventricular size is normal. Tricuspid regurgitation signal is inadequate for assessing PA pressure.  3. The mitral valve is normal in structure. No evidence of mitral valve regurgitation. No evidence of mitral stenosis.  4. The aortic valve was not well visualized. Aortic valve regurgitation is not visualized. No aortic stenosis is present.  5. Aortic dilatation noted. There is mild dilatation of the ascending aorta, measuring 38 mm. FINDINGS  Left Ventricle: Left ventricular ejection fraction, by estimation, is 50 to 55%. The left ventricle has low normal function. Left ventricular endocardial border not optimally defined to evaluate regional wall motion. The left ventricular internal cavity  size was normal in size. There is mild left ventricular hypertrophy. Left ventricular diastolic parameters are indeterminate. Right Ventricle: The right ventricular size is normal. Right ventricular systolic function is mildly reduced. Tricuspid regurgitation signal is inadequate for assessing PA pressure. Left Atrium: Left atrial size was normal in size. Right Atrium: Right  atrial size was normal in size. Pericardium: There is no evidence of pericardial effusion.  Presence of pericardial fat pad. Mitral Valve: The mitral valve is normal in structure. No evidence of mitral valve stenosis. Tricuspid Valve: The tricuspid valve is normal in structure. Tricuspid valve regurgitation is trivial. Aortic Valve: The aortic valve was not well visualized. Aortic valve regurgitation is not visualized. No aortic stenosis is present. Pulmonic Valve: The pulmonic valve was not well visualized. Pulmonic valve regurgitation is trivial. Aorta: The aortic root is normal in size and structure and aortic dilatation noted. There is mild dilatation of the ascending aorta, measuring 38 mm. Venous: IVC assessment for right atrial pressure unable to be performed due to mechanical ventilation. IAS/Shunts: The interatrial septum was not well visualized. LEFT VENTRICLE PLAX 2D LVIDd:         5.40 cm LVIDs:         4.10 cm LV PW:         1.30 cm LV IVS:        1.20 cm LVOT diam:     2.60 cm LV SV:         81 LV SV Index:   34 LVOT Area:     5.31 cm  RIGHT VENTRICLE          IVC RV Basal diam:  2.70 cm  IVC diam: 3.00 cm LEFT ATRIUM             Index       RIGHT ATRIUM           Index LA diam:        3.90 cm 1.63 cm/m  RA Area:     15.70 cm LA Vol (A2C):   75.6 ml 31.51 ml/m RA Volume:   34.00 ml  14.17 ml/m LA Vol (A4C):   51.9 ml 21.63 ml/m LA Biplane Vol: 67.7 ml 28.21 ml/m  AORTIC VALVE LVOT Vmax:   83.10 cm/s LVOT Vmean:  52.600 cm/s LVOT VTI:    0.153 m  AORTA Ao Root diam: 3.90 cm Ao Asc diam:  3.80 cm MITRAL VALVE MV Area (PHT): 3.21 cm    SHUNTS MV Decel Time: 236 msec    Systemic VTI:  0.15 m MV E velocity: 75.80 cm/s  Systemic Diam: 2.60 cm MV A velocity: 47.50 cm/s MV E/A ratio:  1.60 Oswaldo Milian MD Electronically signed by Oswaldo Milian MD Signature Date/Time: 10/21/2020/6:17:39 PM    Final    [place] EKG SITE RITE  Result Date: 10/30/2020 If Site Rite image not attached, placement  could not be confirmed due to current cardiac rhythm.   Labs:  CBC: Recent Labs    11/17/20 0430 11/18/20 0440 11/19/20 0204 11/20/20 0441  WBC 14.8* 19.8* 22.8* 13.7*  HGB 8.6* 8.3* 8.1* 8.1*  HCT 27.6* 26.9* 25.6* 24.7*  PLT 90* 111* 125* 118*    COAGS: Recent Labs    10/20/20 2042 11/02/20 0956 11/08/20 0513 11/17/20 0430 11/18/20 0440 11/19/20 0204 11/20/20 0441  INR 1.0 1.2  --   --   --   --   --   APTT 34 33   < > 38* 28 26 29    < > = values in this interval not displayed.    BMP: Recent Labs    11/19/20 0204 11/19/20 0328 11/19/20 1945 11/20/20 0441  NA 135 136 136 136  K 6.1* 6.3* 3.2* 3.7  CL 101 101 100 99  CO2 20* 21* 27 24  GLUCOSE 137* 129* 105* 103*  BUN 106* 110* 48* 62*  CALCIUM 9.3 9.3 8.1*  8.4*  CREATININE 3.34* 3.40* 1.81* 2.34*  GFRNONAA 21* 20* 43* 32*    LIVER FUNCTION TESTS: Recent Labs    11/08/20 0513 11/08/20 1623 11/09/20 0322 11/09/20 1600 11/10/20 0335 11/10/20 1843 11/11/20 0312 11/11/20 1826 11/19/20 0204 11/19/20 0328 11/19/20 1945 11/20/20 0441  BILITOT 0.6  --  0.3  --  0.4  --  0.5  --   --   --   --   --   AST 24  --  20  --  18  --  17  --   --   --   --   --   ALT 24  --  21  --  22  --  21  --   --   --   --   --   ALKPHOS 64  --  63  --  67  --  74  --   --   --   --   --   PROT 6.7  --  6.6  --  7.1  --  7.4  --   --   --   --   --   ALBUMIN 2.2*  2.2*   < > 2.0*  2.0*   < > 2.1*  2.1*   < > 2.1*  2.1*   < > 2.1* 2.1* 2.0* 2.0*   < > = values in this interval not displayed.    TUMOR MARKERS: No results for input(s): AFPTM, CEA, CA199, CHROMGRNA in the last 8760 hours.  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 IR has been asked to place a gastrostomy tube for ongoing nutritional support - patient history and imaging have been reviewed by Dr. Pascal Lux who approves patient's  anatomy for procedure.  Tentatively planned for g-tube placement 12/30 in IR - heparin SQ to be held AM of 12/30, tube feeds to be held midnight on 12/30, AM labs pending.  Risks and benefits discussed with the patient's sister Codee Tomkiewicz via phone including, but not limited to the need for a barium enema during the procedure, bleeding, infection, peritonitis, or damage to adjacent structures.  All of the patient's sister's questions were answered, patient's sister is agreeable to proceed.  Verbal consent obtained and placed in IR control room.  Thank you for this interesting consult.  I greatly enjoyed meeting Godofredo Heilbrun and look forward to participating in their care.  A copy of this report was sent to the requesting provider on this date.  Electronically Signed: Joaquim Nam, PA-C 11/20/2020, 9:56 AM   I spent a total of 40 Minutes  in face to face in clinical consultation, greater than 50% of which was counseling/coordinating care for gastrostomy placement.

## 2020-11-20 NOTE — Procedures (Signed)
Cortrak ° °Tube Type:  Cortrak - 43 inches °Tube Location:  Left nare °Initial Placement:  Stomach °Secured by: Bridle °Technique Used to Measure Tube Placement:  Documented cm marking at nare/ corner of mouth °Cortrak Secured At:  70 cm ° ° ° °Cortrak Tube Team Note: ° °Consult received to place a Cortrak feeding tube.  ° °X-ray is required, abdominal x-ray has been ordered by the Cortrak team. Please confirm tube placement before using the Cortrak tube.  ° °If the tube becomes dislodged please keep the tube and contact the Cortrak team at [email or web address] (password [email or web address]) for replacement.  °If after hours and replacement cannot be delayed, place a NG tube and confirm placement with an abdominal x-ray.  ° ° °Alorah Mcree MS, RD, LDN °Please refer to AMION for RD and/or RD on-call/weekend/after hours pager ° ° °

## 2020-11-21 ENCOUNTER — Inpatient Hospital Stay (HOSPITAL_COMMUNITY): Payer: BC Managed Care – PPO

## 2020-11-21 HISTORY — PX: IR GASTROSTOMY TUBE MOD SED: IMG625

## 2020-11-21 LAB — GLUCOSE, CAPILLARY
Glucose-Capillary: 133 mg/dL — ABNORMAL HIGH (ref 70–99)
Glucose-Capillary: 115 mg/dL — ABNORMAL HIGH (ref 70–99)
Glucose-Capillary: 125 mg/dL — ABNORMAL HIGH (ref 70–99)
Glucose-Capillary: 156 mg/dL — ABNORMAL HIGH (ref 70–99)
Glucose-Capillary: 90 mg/dL (ref 70–99)
Glucose-Capillary: 99 mg/dL (ref 70–99)

## 2020-11-21 LAB — RENAL FUNCTION PANEL
Albumin: 2 g/dL — ABNORMAL LOW (ref 3.5–5.0)
Albumin: 2.1 g/dL — ABNORMAL LOW (ref 3.5–5.0)
Anion gap: 14 (ref 5–15)
Anion gap: 13 (ref 5–15)
BUN: 102 mg/dL — ABNORMAL HIGH (ref 6–20)
BUN: 110 mg/dL — ABNORMAL HIGH (ref 6–20)
CO2: 22 mmol/L (ref 22–32)
CO2: 20 mmol/L — ABNORMAL LOW (ref 22–32)
Calcium: 8.6 mg/dL — ABNORMAL LOW (ref 8.9–10.3)
Calcium: 8.7 mg/dL — ABNORMAL LOW (ref 8.9–10.3)
Chloride: 104 mmol/L (ref 98–111)
Chloride: 109 mmol/L (ref 98–111)
Creatinine, Ser: 3.15 mg/dL — ABNORMAL HIGH (ref 0.61–1.24)
Creatinine, Ser: 3.22 mg/dL — ABNORMAL HIGH (ref 0.61–1.24)
GFR, Estimated: 22 mL/min — ABNORMAL LOW (ref >60)
GFR, Estimated: 22 mL/min — ABNORMAL LOW (ref >60)
Glucose, Bld: 125 mg/dL — ABNORMAL HIGH (ref 70–99)
Glucose, Bld: 115 mg/dL — ABNORMAL HIGH (ref 70–99)
Phosphorus: 8.6 mg/dL — ABNORMAL HIGH (ref 2.5–4.6)
Phosphorus: 8.9 mg/dL — ABNORMAL HIGH (ref 2.5–4.6)
Potassium: 3.9 mmol/L (ref 3.5–5.1)
Potassium: 4.5 mmol/L (ref 3.5–5.1)
Sodium: 140 mmol/L (ref 135–145)
Sodium: 142 mmol/L (ref 135–145)

## 2020-11-21 LAB — CBC WITH DIFFERENTIAL/PLATELET
Abs Immature Granulocytes: 0.61 10*3/uL — ABNORMAL HIGH (ref 0.00–0.07)
Basophils Absolute: 0 10*3/uL (ref 0.0–0.1)
Basophils Relative: 0 %
Eosinophils Absolute: 0.3 10*3/uL (ref 0.0–0.5)
Eosinophils Relative: 4 %
HCT: 24 % — ABNORMAL LOW (ref 39.0–52.0)
Hemoglobin: 7.4 g/dL — ABNORMAL LOW (ref 13.0–17.0)
Immature Granulocytes: 7 %
Lymphocytes Relative: 10 %
Lymphs Abs: 0.9 10*3/uL (ref 0.7–4.0)
MCH: 31.5 pg (ref 26.0–34.0)
MCHC: 30.8 g/dL (ref 30.0–36.0)
MCV: 102.1 fL — ABNORMAL HIGH (ref 80.0–100.0)
Monocytes Absolute: 1 10*3/uL (ref 0.1–1.0)
Monocytes Relative: 11 %
Neutro Abs: 6.5 10*3/uL (ref 1.7–7.7)
Neutrophils Relative %: 68 %
Platelets: 121 10*3/uL — ABNORMAL LOW (ref 150–400)
RBC: 2.35 MIL/uL — ABNORMAL LOW (ref 4.22–5.81)
RDW: 14.8 % (ref 11.5–15.5)
WBC: 9.4 10*3/uL (ref 4.0–10.5)
nRBC: 0 % (ref 0.0–0.2)

## 2020-11-21 LAB — MAGNESIUM: Magnesium: 2.5 mg/dL — ABNORMAL HIGH (ref 1.7–2.4)

## 2020-11-21 LAB — APTT: aPTT: 28 seconds (ref 24–36)

## 2020-11-21 LAB — PROTIME-INR
INR: 1.1 (ref 0.8–1.2)
Prothrombin Time: 13.8 seconds (ref 11.4–15.2)

## 2020-11-21 MED ORDER — LIDOCAINE HCL 1 % IJ SOLN
INTRAMUSCULAR | Status: AC
  Filled 2020-11-21: qty 20

## 2020-11-21 MED ORDER — VITAL 1.5 CAL PO LIQD
1000.0000 mL | ORAL | Status: DC
  Administered 2020-11-21 – 2020-12-05 (×13): 1000 mL
  Filled 2020-11-21 (×7): qty 1000

## 2020-11-21 MED ORDER — CEFAZOLIN (ANCEF) 1 G IV SOLR
INTRAVENOUS | Status: AC | PRN
  Administered 2020-11-21: 2 g

## 2020-11-21 MED ORDER — IOHEXOL 300 MG/ML  SOLN
50.0000 mL | Freq: Once | INTRAMUSCULAR | Status: AC | PRN
  Administered 2020-11-21: 15:00:00 5 mL

## 2020-11-21 MED ORDER — MIDAZOLAM HCL 2 MG/2ML IJ SOLN
INTRAMUSCULAR | Status: AC
  Filled 2020-11-21: qty 2

## 2020-11-21 MED ORDER — GLUCAGON HCL RDNA (DIAGNOSTIC) 1 MG IJ SOLR
INTRAMUSCULAR | Status: AC | PRN
  Administered 2020-11-21: 1 mg via INTRAVENOUS

## 2020-11-21 MED ORDER — HEPARIN SODIUM (PORCINE) 5000 UNIT/ML IJ SOLN
5000.0000 [IU] | Freq: Three times a day (TID) | INTRAMUSCULAR | Status: DC
  Administered 2020-11-21 – 2020-12-18 (×80): 5000 [IU] via SUBCUTANEOUS
  Filled 2020-11-21 (×77): qty 1

## 2020-11-21 MED ORDER — MIDAZOLAM HCL 2 MG/2ML IJ SOLN
INTRAMUSCULAR | Status: AC | PRN
  Administered 2020-11-21 (×2): 0.5 mg via INTRAVENOUS

## 2020-11-21 MED ORDER — PROSOURCE TF PO LIQD
90.0000 mL | Freq: Three times a day (TID) | ORAL | Status: DC
  Administered 2020-11-22 – 2020-12-11 (×59): 90 mL
  Filled 2020-11-21 (×56): qty 90

## 2020-11-21 MED ORDER — CEFAZOLIN SODIUM-DEXTROSE 2-4 GM/100ML-% IV SOLN
INTRAVENOUS | Status: AC
  Filled 2020-11-21: qty 100

## 2020-11-21 MED ORDER — GLUCAGON HCL RDNA (DIAGNOSTIC) 1 MG IJ SOLR
INTRAMUSCULAR | Status: AC
  Filled 2020-11-21: qty 1

## 2020-11-21 MED ORDER — FENTANYL CITRATE (PF) 100 MCG/2ML IJ SOLN
INTRAMUSCULAR | Status: AC
  Filled 2020-11-21: qty 2

## 2020-11-21 MED ORDER — FENTANYL CITRATE (PF) 100 MCG/2ML IJ SOLN
INTRAMUSCULAR | Status: AC | PRN
  Administered 2020-11-21 (×2): 25 ug via INTRAVENOUS

## 2020-11-21 NOTE — Evaluation (Signed)
Physical Therapy Evaluation Patient Details Name: Jackson Figueroa MRN: [id number] DOB: 07/08/1961 Today's Date: 11/21/2020   History of Present Illness  59 yo admitted 11/28 with respiratory distress and unresponsive at home with Covid 19. Pt with AKI and HD dependent. CRRT 12/16-12/26. Trach 12/23. encephalopathy since admission. PMHx: obesity  Clinical Impression  Pt with eyes open, mouth open and limited command following with delay. Pt with grossly 5 sec delay with single step commands with noted movement in bil hamstrings, bil tricep, bil ankles , left toes and left hand. Pt able to stick out tongue with delay and blink as well as nod to living with long term girlfriend. Pt with profound deconditioning and weakness from prolonged bedrest and medical complications of admission. Pt will benefit from acute therapy to maximize mobility, safety and function to decrease burden of care. Pt would benefit from Kreg tilt bed and discussed with Dr.Smith. Encouraged assisted activity with nursing and chair positioning throughout the day.  Trach collar FiO2 40%, SpO2 98% HR 77-80    Follow Up Recommendations LTACH;Supervision/Assistance - 24 hour    Equipment Recommendations  Hospital bed    Recommendations for Other Services OT consult;Speech consult     Precautions / Restrictions Precautions Precautions: Fall Precaution Comments: cortrak, trach collar, flexiseal      Mobility  Bed Mobility Overal bed mobility: Needs Assistance Bed Mobility: Supine to Sit     Supine to sit: Total assist     General bed mobility comments: total assist with bed functions to transition from supine to sitting and prop bil UE on pillows    Transfers                 General transfer comment: did not attempt due to profound weakness  Ambulation/Gait                Stairs            Wheelchair Mobility    Modified Rankin (Stroke Patients Only)       Balance Overall balance  assessment: Needs assistance   Sitting balance-Leahy Scale: Poor Sitting balance - Comments: trunk supported on surface in sitting                                     Pertinent Vitals/Pain Pain Assessment: No/denies pain (CPOT = 0)    Home Living Family/patient expects to be discharged to:: Private residence Living Arrangements: Spouse/significant other Available Help at Discharge: Family;Available PRN/intermittently             Additional Comments: pt unable to provide full PLOF and home setup but nodding in response to limited questions and per chart    Prior Function Level of Independence: Independent               Hand Dominance        Extremity/Trunk Assessment   Upper Extremity Assessment Upper Extremity Assessment: RUE deficits/detail;LUE deficits/detail RUE Deficits / Details: grossly 1/5 elbow extension, no noted grip, no elbow flexion, trace shoulder flexion LUE Deficits / Details: grossly 1/5 elbow extension, weak 2-/5 grip, no elbow flexion, no shoulder flexion    Lower Extremity Assessment Lower Extremity Assessment: RLE deficits/detail;LLE deficits/detail RLE Deficits / Details: no toe wiggle, trace ankle movement, 2-/5 hip abduct/ADD, trace hamstring activation, no active hip/knee flexion LLE Deficits / Details: toe wiggle 2/5, trace ankle movement, 2-/5 hip abduct/ADD, trace hamstring activation, no active  hip/knee flexion       Communication   Communication: Tracheostomy  Cognition Arousal/Alertness: Awake/alert Behavior During Therapy: Flat affect Overall Cognitive Status: Impaired/Different from baseline Area of Impairment: Following commands;Attention                   Current Attention Level: Focused   Following Commands: Follows one step commands inconsistently       General Comments: pt with delayed command following grossly 5 sec delay with profound weakness and inability to fully assess cognition. Pt able  to stick out his tongue, wiggle toes and squeeze left hand on commands with delay      General Comments      Exercises General Exercises - Upper Extremity Shoulder Flexion: PROM;Both;Seated;10 reps Elbow Flexion: PROM;Both;Seated;10 reps Elbow Extension: AAROM;Both;Seated;10 reps Wrist Flexion: PROM;Both;10 reps;Seated Digit Composite Flexion: PROM;Left;Seated;5 reps (AROM on LUE, PROM on RUE) General Exercises - Lower Extremity Ankle Circles/Pumps: PROM;AAROM;Right;10 reps;Seated (PROM on RLE) Short Arc Quad: PROM;Both;Seated;10 reps Hip ABduction/ADduction: AAROM;Both;10 reps;Seated Hip Flexion/Marching: PROM;Both;10 reps;Seated   Assessment/Plan    PT Assessment Patient needs continued PT services  PT Problem List Decreased mobility;Decreased strength;Decreased range of motion;Decreased activity tolerance;Cardiopulmonary status limiting activity;Decreased cognition;Decreased balance;Decreased knowledge of use of DME       PT Treatment Interventions DME instruction;Therapeutic exercise;Gait training;Cognitive remediation;Therapeutic activities;Patient/family education;Neuromuscular re-education;Functional mobility training;Balance training    PT Goals (Current goals can be found in the Care Plan section)  Acute Rehab PT Goals PT Goal Formulation: Patient unable to participate in goal setting Time For Goal Achievement: 12/05/20 Potential to Achieve Goals: Fair    Frequency Min 3X/week   Barriers to discharge Decreased caregiver support      Co-evaluation               AM-PAC PT "6 Clicks" Mobility  Outcome Measure Help needed turning from your back to your side while in a flat bed without using bedrails?: Total Help needed moving from lying on your back to sitting on the side of a flat bed without using bedrails?: Total Help needed moving to and from a bed to a chair (including a wheelchair)?: Total Help needed standing up from a chair using your arms (e.g.,  wheelchair or bedside chair)?: Total Help needed to walk in hospital room?: Total Help needed climbing 3-5 steps with a railing? : Total 6 Click Score: 6    End of Session   Activity Tolerance: Patient tolerated treatment well Patient left: in bed;with call bell/phone within reach Nurse Communication: Mobility status;Need for lift equipment PT Visit Diagnosis: Other abnormalities of gait and mobility (R26.89);Difficulty in walking, not elsewhere classified (R26.2);Other symptoms and signs involving the nervous system (R29.898)    Time: 8527-7824 PT Time Calculation (min) (ACUTE ONLY): 27 min   Charges:   PT Evaluation $PT Eval High Complexity: 1 High          Leighanna Kirn P, PT Acute Rehabilitation Services Pager: [phone] Office: [phone]   Enedina Finner Suad Autrey 11/21/2020, 10:37 AM

## 2020-11-21 NOTE — Progress Notes (Signed)
Nutrition Follow-up  DOCUMENTATION CODES:   Obesity unspecified  INTERVENTION:   After PEG placed, resume tube feeding: Vital 1.5 at 65 ml/h (1560 ml per day) Prosource TF 90 ml TID  Provides 2580 kcal, 171 gm protein, 1192 ml free water daily.  NUTRITION DIAGNOSIS:   Increased nutrient needs related to acute illness,catabolic illness (VBTYO-06 infection) as evidenced by estimated needs.  Ongoing   GOAL:   Provide needs based on ASPEN/SCCM guidelines  Met with TF  MONITOR:   Vent status,Labs,Weight trends,Skin,Other (Comment) (plan concerning nutrition support)  REASON FOR ASSESSMENT:   Ventilator,Consult Enteral/tube feeding initiation and management  ASSESSMENT:   59 year old male with no significant medical history. He was found to be COVID-19 positive on 11/26. He presented to [facility] with profound hypoxemia on 11/28 and was emergently intubated in the ED; found to be in ARDS and transferred to [facility].  Discussed patient in ICU rounds and with RN today. Patient transferred to [facility] on 12/27. Cortrak placed 12/29, tip in duodenal bulb. TF currently on hold for PEG placement today.   Patient is currently on trach collar.  He requires vent support at night.  Last HD was 12/28, no UF.  Labs reviewed. BUN 102, creat 3.15, phos 8.6, mag 2.5 CBG: [phone]  Medications reviewed and include Aranesp, Tradjenta.  Weight 108.5 kg, trending down.  NUTRITION - FOCUSED PHYSICAL EXAM:  Flowsheet Row Most Recent Value  Orbital Region Moderate depletion  Upper Arm Region No depletion  Thoracic and Lumbar Region Mild depletion  Buccal Region Mild depletion  Temple Region Moderate depletion  Clavicle Bone Region Moderate depletion  Clavicle and Acromion Bone Region Moderate depletion  Scapular Bone Region Mild depletion  Dorsal Hand No depletion  Patellar Region Mild depletion  Anterior Thigh Region Mild depletion  Posterior Calf Region Moderate depletion   Edema (RD Assessment) Moderate  Hair Reviewed  Eyes Reviewed  Mouth Reviewed  Skin Reviewed  Nails Reviewed       Diet Order:   Diet Order            Diet NPO time specified  Diet effective now                 EDUCATION NEEDS:   No education needs have been identified at this time  Skin:  Skin Assessment: Skin Integrity Issues: Skin Integrity Issues:: DTI,Unstageable,Other (Comment) DTI: sacrum, toe Unstageable: full thickness to R nose Other: MASD buttocks  Last BM:  12/29  Height:   Ht Readings from Last 1 Encounters:  11/11/20 6' (1.829 m)    Weight:   Wt Readings from Last 1 Encounters:  11/21/20 108.5 kg    BMI:  Body mass index is 32.44 kg/m.  Estimated Nutritional Needs:   Kcal:  1423-9532  Protein:  160-180 gm  Fluid:  >/= 2.5 L    Lucas Mallow, RD, LDN, CNSC Please refer to Amion for contact information.

## 2020-11-21 NOTE — Progress Notes (Addendum)
NAME:  Jackson Figueroa, MRN:  [id number], DOB:  11-04-1961, LOS: 32 ADMISSION DATE:  10/20/2020, CONSULTATION DATE:  11/29 REFERRING MD:  Hyacinth Meeker, CHIEF COMPLAINT:  ARDS COVID 43   Brief History   59 year old male with no significant past medical history and a positive COVID-19 test on 11/26 presented to [facility] with profound hypoxemia on 11/28.  Intubated immediately upon arrival found to be in ARDS. Transferred to [facility] long hospital for ongoing ICU care  11/29.  Past Medical History  Obesity  Significant Hospital Events   11/28 Presented to [facility] ER, intubated for hypoxemia, empiric abx, shock on levo. COVID Rx 11/29 Persistent shock, central line placed, ARDS protocol continued, proning initiated 11/30 Pressor requirements continued, NMB initiated to facilitate ventilation tube feeds started, initiated proning 12/01 Stopping NMB, proning positioning protocol discontinued, free water for rising creatinine and sodium 12/02 PEEP trending down.  Renal function improved. Changed to versed from prop gtt d/t rising triglycerides  12/03 Remdesivir completed. Peep/FIO2 stable. Free water adjusted. Solumedrol reduced, fentanyl to Dilaudid, adding Oxycodone  12/04 Dyssynchrony, given paralytic overnight, changed to ketamine; vomited / aspirated, restarted paralytics 12/06 Proned, under NMB 12/07 Proning, NMB continued. Switched antibiotics for stenotrophomonas 12/13 Stopped oxycodone, added dilaudid oral, continuous nimbex infusion to prn, palliative care consulted 12/14 Changed to pressure control 12/15 Worsening vent synchrony after clamping OG tube 12/18 added reglan IV for ? Ileus developing  12/18 back on paralytics for "guppy breathing/ air stacking" on PCV ? ,mucus plug  12/19  Back on PCV and off paralytics  12/20 having intermittent fevers, atrial fibrillation with some ST changes.  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 Started on minocycline per ID 12/22 stable decreased steroid dosing. Clonazepam added 12/23 trach placed. Versed off. Changed dilaudid from 4mg -.3mg , added clonidine and increased depakote. Added midodrine. OGT out during surg. Post-pyloric dislodged. Tip in stomach. Attempting trickle feeds w/ reglan   12/24 NG tube placed by IR 12/25 Dilaudid weaned to off, tolerated trach collar 12/26 tolerated weaning for about 4 hours on trach collar Consults:   Nephrology 12/14 Palliative 12/13 ID signed off 12/17  Procedures:   ETT 11/28 >>trach 12/23 Bates CVL 11/29 >> 12/9 R PICC 12/9 >>  R IJ HD Cath 12/17 >>  Significant Diagnostic Tests:   ECHO 11/29 >> LVEF 50-55%, mild LVH, RV systolic function mildly reduced. Mild dilation of ascending aorta 21mm. LE Venous Duplex 12/8 >> negative for DVT   Micro Data:   COVID 11/28 >> Positive  Influenza A/B 11/28 >> negative  UC 11/28 >> negative  BCx2 11/28 >> negative  Tracheal aspirate 12/5 >> Stenotrophomonas maltophilia >> S-bactrim, levofloxacin UC 12/11 >> negative  Tracheal aspirate 12/11 >> Stenotrophomonas maltophilia >> S-bactrim, levofloxacin  BCx2 12/11 >> negative  Trach 12/ 19 >>> multidrug resistance stenotrophomonas  Antimicrobials/covid rx:   CTX 11/28 > 11/29 Azithromycin 11/28 > 11/29 Remdesivir 11/28 > 12/2 Prednisone 11/28 >  Baricitinib 12/4 > 12/5 Vanco 12/5 >> 12/7, 12/11 x1 Cefepime 12/5 >> 12/7, 12/11 >> 12/13 Levaquin 12/7 >> 12/16 Minocycline 12/21>>>  Interim history/subjective:  No events, remains much more alert. Denies pain.  Profoundly weak.  Objective   Blood pressure 119/72, pulse 83, temperature 98.3 F (36.8 C), temperature source Axillary, resp. rate (!) 22, height 6' (1.829 m), weight 108.5 kg, SpO2 98 %.  Vent Mode:  PCV FiO2 (%):  [40 %] 40 % Set Rate:  [15 bmp] 15 bmp PEEP:  [5 cmH20] 5 cmH20 Plateau Pressure:  [14 cmH20] 14 cmH20   Intake/Output Summary (Last 24 hours) at 11/21/2020 0734 Last data filed at 11/21/2020 0600 Gross per 24 hour  Intake 2779.32 ml  Output 575 ml  Net 2204.32 ml   Filed Weights   11/18/20 0446 11/19/20 0500 11/21/20 0447  Weight: 109.9 kg 108.3 kg 108.5 kg    Examination: Constitutional: ill appearing man on vent  Eyes: able to track, pupils equal Ears, nose, mouth, and throat: trach in place, purulence around stoma site continues Cardiovascular: RRR, ext warm Respiratory: diminished, triggering vent Gastrointestinal: soft, hypoactive BS Skin: No rashes, normal turgor Neurologic: flicker movements of all 4 ext, able to shrug shoulders and nod appropraitely to questions Psychiatric: RASS 0  Labs looks stable  Resolved Hospital Problem list   Post trach bleeding  Assessment & Plan:   ARDS due to COVID-19 pneumonia Profound deconditioning Metabolic encephalopathy- combination ICU delirium, medication effects, this plus deconditioning remain large barrier to vent liberation; improved with lightening sedation Multifactorial ATN- now likely HD dependent, tx to [facility] 12/27 for iHD trial 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- finished course of minocycline Thrombocytopenia- improving, HIT neg Borderline Bps Question per-stoma tracheal site infection  -IR PEG consult: may be getting today -Continue TC trials - Culture trach site -iHD as tolerated by Bps -PT/OT -Will need months of rehab to regain any functionality provided no further complications  Best practice (evaluated daily)  Diet: Continue tube feeds Pain/Anxiety/Delirium protocol (if indicated): as above VAP protocol (if indicated): yes DVT prophylaxis: heparin sq  GI prophylaxis: famotidine Glucose control: off Mobility: bed rest Last date of multidisciplinary goals of care discussion  12/29 with sister.   Family and staff present nurse Summary of discussion: hope for as much functional recovery as possible Follow up goals of care discussion due: 11/28/19 Code Status: full code  Disposition: ICU   Patient critically ill due to COVID ARDS, metabolic encephalopathy Interventions to address this today include vent weaning Risk of deterioration without these interventions is high  I personally spent 37 minutes providing critical care not including any separately billable procedures  Erskine Emery MD [facility] 11/21/2020 7:34 AM Personal pager: [phone] If unanswered, please page CCM On-call: [phone]

## 2020-11-21 NOTE — Progress Notes (Signed)
[facility] NEPHROLOGY PROGRESS NOTE  Assessment/ Plan: Pt is a 59 y.o. yo male with history of COVID-19, ARDS, mechanical ventilation, shock with dialysis dependent AKI.  #Acute kidney injury, dialysis dependent: ATN due to septic shock, COVID-19 infection.  Kidney ultrasound ruled out obstruction.  Required CRRT from 12/16-12/26.  Since then intermittent hemodialysis, last HD on 12/28 with no UF.   Volume status acceptable.  Started IV fluid on 12/28.  Creatinine level mildly elevated however urine output increasing to 575 cc/24 hr.  Watch for renal recovery.  No need for dialysis today.  #ARDS due to COVID-19 pneumonia: Required mechanical ventilation, ECMO.  Currently has tracheostomy on vent.  Per PCCM.  #Acute metabolic encephalopathy combination of medication, ICU delirium, sedatives.  Monitor mental status.  Opens eyes.  #MDR stenotrophomonas pneumonia: Completed antibiotics per primary team.  #Severe physical deconditioning: PT OT, the team is planning PEG placement.  #Anemia due to critical illness: Transfusion as needed.  On ESA.  #Hypotension: Borderline BP.  Not on antihypertensive.  I will add midodrine from tube feeds.  Subjective: Seen and examined ICU.  Opens eyes only but not following commands.  Has tracheostomy.  Urine output around 575 cc.  Discussed with ICU team. Objective Vital signs in last 24 hours: Vitals:   11/21/20 0530 11/21/20 0600 11/21/20 0630 11/21/20 0700  BP: 104/71 112/69 109/70 119/72  Pulse: 76 81 82 83  Resp: (!) 23 20 (!) 21 (!) 22  Temp:    98.5 F (36.9 C)  TempSrc:    Axillary  SpO2: 97% 99% 98% 98%  Weight:      Height:       Weight change:   Intake/Output Summary (Last 24 hours) at 11/21/2020 0850 Last data filed at 11/21/2020 0600 Gross per 24 hour  Intake 2673.89 ml  Output 575 ml  Net 2098.89 ml       Labs: Basic Metabolic Panel: Recent Labs  Lab 11/20/20 0441 11/20/20 1734 11/21/20 0436  NA 136  138 140  K 3.7 4.1 3.9  CL 99 103 104  CO2 _0 GLUCOSE 103* 109* 125*  BUN 62* 83* 102*  CREATININE 2.34* 2.87* 3.15*  CALCIUM 8.4* 8.6* 8.6*  PHOS 6.2* 8.2* 8.6*   Liver Function Tests: Recent Labs  Lab 11/20/20 0441 11/20/20 1734 11/21/20 0436  ALBUMIN 2.0* 2.0* 2.0*   No results for input(s): LIPASE, AMYLASE in the last 168 hours. No results for input(s): AMMONIA in the last 168 hours. CBC: Recent Labs  Lab 11/17/20 0430 11/18/20 0440 11/19/20 0204 11/20/20 0441 11/21/20 0436  WBC 14.8* 19.8* 22.8* 13.7* 9.4  NEUTROABS 11.4* 14.7* 18.7* 9.9* 6.5  HGB 8.6* 8.3* 8.1* 8.1* 7.4*  HCT 27.6* 26.9* 25.6* 24.7* 24.0*  MCV 103.4* 103.5* 101.6* 100.0 102.1*  PLT 90* 111* 125* 118* 121*   Cardiac Enzymes: No results for input(s): CKTOTAL, CKMB, CKMBINDEX, TROPONINI in the last 168 hours. CBG: Recent Labs  Lab 11/20/20 1536 11/20/20 1940 11/20/20 2332 11/21/20 0333 11/21/20 0807  GLUCAP 113* 101* 112* 133* 115*    Iron Studies: No results for input(s): IRON, TIBC, TRANSFERRIN, FERRITIN in the last 72 hours. Studies/Results: CT ABDOMEN WO CONTRAST  Result Date: 11/20/2020 CLINICAL DATA:  Evaluate anatomy prior to potential percutaneous gastrostomy tube placement. EXAM: CT ABDOMEN WITHOUT CONTRAST TECHNIQUE: Multidetector CT imaging of the abdomen was performed following the standard protocol without IV contrast. COMPARISON:  Chest radiograph-11/17/2020 FINDINGS: The lack of intravenous contrast limits the ability to evaluate  solid abdominal organs. Lower chest: Limited visualization of the lower thorax demonstrates right basilar subpleural consolidative opacities, interseptal thickening and bronchiectasis within the imaged lung bases and right middle lobe. No pleural effusion. Borderline cardiomegaly. Trace amount of pericardial fluid, presumably physiologic. There is diffuse decreased attenuation of the intra cardiac blood pool suggestive of anemia. Tip of central  venous catheter terminates within the superior aspect of the right atrium. Hepatobiliary: Normal hepatic contour. Normal noncontrast appearance of the gallbladder given degree of distention. No radiopaque gallstones. No ascites. Pancreas: Normal noncontrast appearance of the pancreas. Spleen: Borderline splenomegaly with the spleen measuring 13.2 cm in length. Adrenals/Urinary Tract: Punctate nonobstructing stones are seen within the inferior pole of the kidneys bilaterally (coronal images 58 and 59, series 5). No urinary obstruction. Dominant approximately 2.7 cm hypoattenuating (7 Hounsfield unit) exophytic lesion arising inferolateral aspect of the right kidney is incompletely characterized on the present examination though likely represents a renal cyst. Indeterminate left-sided renal lesions are identified with dominant hyperdense left-sided renal lesion measuring 3.6 x 2.8 cm (coronal image 50, series 5), potentially representative of a hyperdense cyst though incompletely characterized on the present examination. Normal noncontrast appearance of the bilateral adrenal glands. The urinary bladder was not imaged. Stomach/Bowel: While there is a moderate to large amount of mesenteric fat between the anterior wall of the stomach and ventral wall of the upper abdomen (measuring approximately 7.5 cm - axial image 20, series 3), there is no definitive interposition of either the hepatic parenchyma or the transverse colon and the percutaneous window will likely be improved with gastric insufflation. Enteric contrast is seen throughout the transverse colon. No evidence of enteric obstruction. No hiatal hernia. Vascular/Lymphatic: Scattered atherosclerotic plaque within a normal caliber abdominal aorta. No bulky retroperitoneal or mesenteric lymphadenopathy on this noncontrast examination. Other: There is a minimal amount of subcutaneous edema about the midline of the low back. Musculoskeletal: No acute or aggressive  osseous abnormalities. Stigmata of dish within the lower thoracic spine. IMPRESSION: 1. Gastric anatomy amenable to attempted percutaneous gastrostomy tube placement as indicated. 2. Right basilar subpleural consolidative opacities atelectasis versus infiltrate/aspiration. 3. Interstitial thickening and bronchiectasis within the imaged lung bases, nonspecific with broad differential considerations including atypical infection and fibrosis. Further evaluation with dedicated chest CT could be performed as indicated. 4. Bilateral nonobstructing nephrolithiasis. 5. Indeterminate left-sided renal lesions, potentially representative of hypertension renal cysts, though incompletely characterized on the present examination. Further evaluation with nonemergent abdominal MRI could be performed as indicated. 6. Aortic Atherosclerosis (ICD10-I70.0). Electronically Signed   By: Sandi Mariscal M.D.   On: 11/20/2020 09:07   DG Abd Portable 1V  Result Date: 11/20/2020 CLINICAL DATA:  Feeding tube placement EXAM: PORTABLE ABDOMEN - 1 VIEW COMPARISON:  11/19/2020 FINDINGS: A central catheter is noted with tip projecting over the right atrium. A feeding tube is present entering the stomach and with tip in the expected vicinity of the duodenal bulb. Suspected mild cardiomegaly. Lower thoracic spondylosis. Stable hazy retro diaphragmatic density on the right. Paucity of bowel gas in the upper abdomen. IMPRESSION: 1. Feeding tube tip: duodenal bulb. 2. Stable hazy retro diaphragmatic density on the right. 3. Suspected mild cardiomegaly. Electronically Signed   By: Van Clines M.D.   On: 11/20/2020 12:53   DG Abd Portable 1V  Result Date: 11/19/2020 CLINICAL DATA:  Evaluate feeding tube placement. EXAM: PORTABLE ABDOMEN - 1 VIEW COMPARISON:  11/14/2020 FINDINGS: The tip of the feeding tube is not significantly changed in remains at the level  of the GE junction. Bowel gas pattern is unremarkable. IMPRESSION: Feeding tube tip  remains at the GE junction. Electronically Signed   By: Kerby Moors M.D.   On: 11/19/2020 12:19    Medications: Infusions: . sodium chloride 100 mL/hr at 11/21/20 0600  . feeding supplement (PIVOT 1.5 CAL) Stopped (11/21/20 0730)  . sodium chloride      Scheduled Medications: . albuterol  2.5 mg Nebulization QID  . chlorhexidine gluconate (MEDLINE KIT)  15 mL Mouth Rinse BID  . Chlorhexidine Gluconate Cloth  6 each Topical Q0600  . darbepoetin (ARANESP) injection - DIALYSIS  100 mcg Intravenous Q Tue-HD  . famotidine  20 mg Per Tube QHS  . feeding supplement (PROSource TF)  90 mL Per Tube 6 X Daily  . heparin injection (subcutaneous)  5,000 Units Subcutaneous Q8H  . linagliptin  5 mg Per Tube Daily  . mouth rinse  15 mL Mouth Rinse 10 times per day  . midodrine  10 mg Per Tube TID WC  . sodium chloride flush  10-40 mL Intracatheter Q12H    have reviewed scheduled and prn medications.  Physical Exam: General: Critically ill looking male, has  tracheostomy, on vent, not in distress Heart:RRR, s1s2 nl Lungs: Coarse breath sound bilateral Abdomen:soft,  non-distended Extremities:No LE edema Dialysis Access: IJ temporary HD catheter  Bryleigh Ottaway Prasad Hipolito Martinezlopez 11/21/2020,8:50 AM  LOS: 32 days

## 2020-11-21 NOTE — Procedures (Signed)
Interventional Radiology Procedure Note  Procedure: Placement of percutaneous 20F pull-through gastrostomy tube. Complications: None Recommendations: - NPO except for sips and chips remainder of today and overnight - Maintain G-tube to LWS until tomorrow morning  - May advance diet as tolerated and begin using tube tomorrow morning  Signed,  Shane Melby K. Macedonio Scallon, MD   

## 2020-11-22 LAB — AEROBIC CULTURE W GRAM STAIN (SUPERFICIAL SPECIMEN): Gram Stain: NONE SEEN

## 2020-11-22 LAB — CBC WITH DIFFERENTIAL/PLATELET
Abs Immature Granulocytes: 0.45 10*3/uL — ABNORMAL HIGH (ref 0.00–0.07)
Basophils Absolute: 0 10*3/uL (ref 0.0–0.1)
Basophils Relative: 0 %
Eosinophils Absolute: 0 10*3/uL (ref 0.0–0.5)
Eosinophils Relative: 0 %
HCT: 24.4 % — ABNORMAL LOW (ref 39.0–52.0)
Hemoglobin: 7.5 g/dL — ABNORMAL LOW (ref 13.0–17.0)
Immature Granulocytes: 4 %
Lymphocytes Relative: 6 %
Lymphs Abs: 0.7 10*3/uL (ref 0.7–4.0)
MCH: 31.4 pg (ref 26.0–34.0)
MCHC: 30.7 g/dL (ref 30.0–36.0)
MCV: 102.1 fL — ABNORMAL HIGH (ref 80.0–100.0)
Monocytes Absolute: 1 10*3/uL (ref 0.1–1.0)
Monocytes Relative: 9 %
Neutro Abs: 9.3 10*3/uL — ABNORMAL HIGH (ref 1.7–7.7)
Neutrophils Relative %: 81 %
Platelets: 175 10*3/uL (ref 150–400)
RBC: 2.39 MIL/uL — ABNORMAL LOW (ref 4.22–5.81)
RDW: 14.8 % (ref 11.5–15.5)
WBC: 11.4 10*3/uL — ABNORMAL HIGH (ref 4.0–10.5)
nRBC: 0.2 % (ref 0.0–0.2)

## 2020-11-22 LAB — RENAL FUNCTION PANEL
Albumin: 2.1 g/dL — ABNORMAL LOW (ref 3.5–5.0)
Albumin: 2.2 g/dL — ABNORMAL LOW (ref 3.5–5.0)
Anion gap: 14 (ref 5–15)
Anion gap: 13 (ref 5–15)
BUN: 112 mg/dL — ABNORMAL HIGH (ref 6–20)
BUN: 115 mg/dL — ABNORMAL HIGH (ref 6–20)
CO2: 20 mmol/L — ABNORMAL LOW (ref 22–32)
CO2: 20 mmol/L — ABNORMAL LOW (ref 22–32)
Calcium: 8.8 mg/dL — ABNORMAL LOW (ref 8.9–10.3)
Calcium: 8.7 mg/dL — ABNORMAL LOW (ref 8.9–10.3)
Chloride: 111 mmol/L (ref 98–111)
Chloride: 110 mmol/L (ref 98–111)
Creatinine, Ser: 3.4 mg/dL — ABNORMAL HIGH (ref 0.61–1.24)
Creatinine, Ser: 3.34 mg/dL — ABNORMAL HIGH (ref 0.61–1.24)
GFR, Estimated: 20 mL/min — ABNORMAL LOW (ref >60)
GFR, Estimated: 21 mL/min — ABNORMAL LOW (ref >60)
Glucose, Bld: 105 mg/dL — ABNORMAL HIGH (ref 70–99)
Glucose, Bld: 139 mg/dL — ABNORMAL HIGH (ref 70–99)
Phosphorus: 8.6 mg/dL — ABNORMAL HIGH (ref 2.5–4.6)
Phosphorus: 7.6 mg/dL — ABNORMAL HIGH (ref 2.5–4.6)
Potassium: 4.5 mmol/L (ref 3.5–5.1)
Potassium: 4.2 mmol/L (ref 3.5–5.1)
Sodium: 145 mmol/L (ref 135–145)
Sodium: 143 mmol/L (ref 135–145)

## 2020-11-22 LAB — GLUCOSE, CAPILLARY
Glucose-Capillary: 94 mg/dL (ref 70–99)
Glucose-Capillary: 104 mg/dL — ABNORMAL HIGH (ref 70–99)
Glucose-Capillary: 116 mg/dL — ABNORMAL HIGH (ref 70–99)
Glucose-Capillary: 132 mg/dL — ABNORMAL HIGH (ref 70–99)
Glucose-Capillary: 150 mg/dL — ABNORMAL HIGH (ref 70–99)

## 2020-11-22 LAB — MAGNESIUM: Magnesium: 2.7 mg/dL — ABNORMAL HIGH (ref 1.7–2.4)

## 2020-11-22 LAB — APTT: aPTT: 27 seconds (ref 24–36)

## 2020-11-22 MED ORDER — MINOCYCLINE HCL 50 MG PO CAPS
100.0000 mg | ORAL_CAPSULE | Freq: Two times a day (BID) | ORAL | Status: AC
  Administered 2020-11-22 – 2020-12-05 (×27): 100 mg
  Filled 2020-11-22 (×6): qty 2
  Filled 2020-11-22: qty 1
  Filled 2020-11-22 (×7): qty 2
  Filled 2020-11-22: qty 1
  Filled 2020-11-22 (×14): qty 2

## 2020-11-22 MED ORDER — SODIUM CHLORIDE 0.9 % IV SOLN
2.0000 g | INTRAVENOUS | Status: AC
  Administered 2020-11-22 – 2020-11-28 (×7): 2 g via INTRAVENOUS
  Filled 2020-11-22 (×7): qty 20

## 2020-11-22 MED ORDER — SODIUM CHLORIDE 3 % IN NEBU
4.0000 mL | INHALATION_SOLUTION | Freq: Two times a day (BID) | RESPIRATORY_TRACT | Status: AC
  Administered 2020-11-22 – 2020-11-24 (×4): 4 mL via RESPIRATORY_TRACT
  Filled 2020-11-22 (×6): qty 4

## 2020-11-22 MED ORDER — FREE WATER
200.0000 mL | Status: DC
  Administered 2020-11-22 – 2020-11-23 (×5): 200 mL

## 2020-11-22 NOTE — Progress Notes (Signed)
[facility] NEPHROLOGY PROGRESS NOTE  Assessment/ Plan: Pt is a 59 y.o. yo male with history of COVID-19, ARDS, mechanical ventilation, shock with dialysis dependent AKI.  #Acute kidney injury: severe requiring dialysis. ATN due to septic shock, COVID-19 infection.  Kidney ultrasound ruled out obstruction.  Required CRRT from 12/16-12/26.  Since then intermittent hemodialysis, last HD on 12/28 with no UF.   -Volume status acceptable -Creatinine slightly higher today but urine output is improving -Continue to hold on dialysis; ideally over the weekend -Maintain dialysis catheter for now  #ARDS due to COVID-19 pneumonia: Required mechanical ventilation, ECMO.  Currently has tracheostomy on vent.  Per PCCM.  #Acute metabolic encephalopathy combination of medication, ICU delirium, sedatives.  Monitor mental status.  Opens eyes seems to be improving  #MDR stenotrophomonas pneumonia: Completed antibiotics per primary team.  #Severe physical deconditioning: PT OT, the team is planning PEG placement.  #Anemia due to critical illness: Transfusion as needed.  On Aranesp 100 mcg weekly  #Hypotension: Borderline BP.  BP acceptable on midodrine 10 mg 3 times daily  Subjective: Minimally interactive today.  Urine output is improving.  No significant changes.  Objective Vital signs in last 24 hours: Vitals:   11/22/20 0700 11/22/20 0800 11/22/20 0822 11/22/20 0900  BP: 121/75 112/68  115/69  Pulse: 97 93 91 90  Resp: (!) 26 (!) 26 (!) 29 (!) 32  Temp: 99.3 F (37.4 C)     TempSrc: Axillary     SpO2: 98% 97% 97% 94%  Weight:      Height:       Weight change: 2.4 kg  Intake/Output Summary (Last 24 hours) at 11/22/2020 1034 Last data filed at 11/22/2020 0900 Gross per 24 hour  Intake 3298.14 ml  Output 925 ml  Net 2373.14 ml       Labs: Basic Metabolic Panel: Recent Labs  Lab 11/21/20 0436 11/21/20 1600 11/22/20 0601  NA 140 142 145  K 3.9 4.5 4.5  CL 104  109 111  CO2 22 20* 20*  GLUCOSE 125* 115* 105*  BUN 102* 110* 112*  CREATININE 3.15* 3.22* 3.40*  CALCIUM 8.6* 8.7* 8.8*  PHOS 8.6* 8.9* 8.6*   Liver Function Tests: Recent Labs  Lab 11/21/20 0436 11/21/20 1600 11/22/20 0601  ALBUMIN 2.0* 2.1* 2.1*   No results for input(s): LIPASE, AMYLASE in the last 168 hours. No results for input(s): AMMONIA in the last 168 hours. CBC: Recent Labs  Lab 11/18/20 0440 11/19/20 0204 11/20/20 0441 11/21/20 0436 11/22/20 0601  WBC 19.8* 22.8* 13.7* 9.4 11.4*  NEUTROABS 14.7* 18.7* 9.9* 6.5 9.3*  HGB 8.3* 8.1* 8.1* 7.4* 7.5*  HCT 26.9* 25.6* 24.7* 24.0* 24.4*  MCV 103.5* 101.6* 100.0 102.1* 102.1*  PLT 111* 125* 118* 121* 175   Cardiac Enzymes: No results for input(s): CKTOTAL, CKMB, CKMBINDEX, TROPONINI in the last 168 hours. CBG: Recent Labs  Lab 11/21/20 1551 11/21/20 1938 11/21/20 2325 11/22/20 0329 11/22/20 0713  GLUCAP 156* 90 99 94 104*    Iron Studies: No results for input(s): IRON, TIBC, TRANSFERRIN, FERRITIN in the last 72 hours. Studies/Results: IR GASTROSTOMY TUBE MOD SED  Result Date: 11/21/2020 INDICATION: Persistent dysphagia, in need of long-term nutrition. EXAM: Fluoroscopically guided placement of percutaneous pull-through gastrostomy tube Interventional Radiologist:  Criselda Peaches, MD MEDICATIONS: 2 g Ancef, 1 mg glucagon; Antibiotics were administered within 1 hour of the procedure. ANESTHESIA/SEDATION: Versed 1 mg IV; Fentanyl 50 mcg IV Moderate Sedation Time:  13 minutes The patient was continuously  monitored during the procedure by the interventional radiology nurse under my direct supervision. CONTRAST:  27mL OMNIPAQUE IOHEXOL 300 MG/ML  SOLN FLUOROSCOPY TIME:  Fluoroscopy Time: 3 minutes 6 seconds (37 mGy). COMPLICATIONS: None immediate. PROCEDURE: Informed written consent was obtained from the patient after a thorough discussion of the procedural risks, benefits and alternatives. All questions were  addressed. Maximal Sterile Barrier Technique was utilized including caps, mask, sterile gowns, sterile gloves, sterile drape, hand hygiene and skin antiseptic. A timeout was performed prior to the initiation of the procedure. Maximal barrier sterile technique utilized including caps, mask, sterile gowns, sterile gloves, large sterile drape, hand hygiene, and chlorhexadine skin prep. An angled catheter was advanced over a wire under fluoroscopic guidance through the nose, down the esophagus and into the body of the stomach. The stomach was then insufflated with several 100 ml of air. Fluoroscopy confirmed location of the gastric bubble, as well as inferior displacement of the barium stained colon. Under direct fluoroscopic guidance, a single T-tack was placed, and the anterior gastric wall drawn up against the anterior abdominal wall. Percutaneous access was then obtained into the mid gastric body with an 18 gauge sheath needle. Aspiration of air, and injection of contrast material under fluoroscopy confirmed needle placement. An Amplatz wire was advanced in the gastric body and the access needle exchanged for a 9-French vascular sheath. A snare device was advanced through the vascular sheath and an Amplatz wire advanced through the angled catheter. The Amplatz wire was successfully snared and this was pulled up through the esophagus and out the mouth. A 20-French Alinda Dooms MIC-PEG tube was then connected to the snare and pulled through the mouth, down the esophagus, into the stomach and out to the anterior abdominal wall. Hand injection of contrast material confirmed intragastric location. The T-tack retention suture was then cut. The pull through peg tube was then secured with the external bumper and capped. The patient will be observed for several hours with the newly placed tube on low wall suction to evaluate for any post procedure complication. The patient tolerated the procedure well, there is no  immediate complication. IMPRESSION: Successful placement of a 20 French pull through gastrostomy tube. Electronically Signed   By: Jacqulynn Cadet M.D.   On: 11/21/2020 17:38   DG Abd Portable 1V  Result Date: 11/20/2020 CLINICAL DATA:  Feeding tube placement EXAM: PORTABLE ABDOMEN - 1 VIEW COMPARISON:  11/19/2020 FINDINGS: A central catheter is noted with tip projecting over the right atrium. A feeding tube is present entering the stomach and with tip in the expected vicinity of the duodenal bulb. Suspected mild cardiomegaly. Lower thoracic spondylosis. Stable hazy retro diaphragmatic density on the right. Paucity of bowel gas in the upper abdomen. IMPRESSION: 1. Feeding tube tip: duodenal bulb. 2. Stable hazy retro diaphragmatic density on the right. 3. Suspected mild cardiomegaly. Electronically Signed   By: Van Clines M.D.   On: 11/20/2020 12:53    Medications: Infusions: . sodium chloride 100 mL/hr at 11/22/20 1032  . feeding supplement (VITAL 1.5 CAL) 1,000 mL (11/21/20 1724)  . sodium chloride      Scheduled Medications: . albuterol  2.5 mg Nebulization QID  . chlorhexidine gluconate (MEDLINE KIT)  15 mL Mouth Rinse BID  . Chlorhexidine Gluconate Cloth  6 each Topical Q0600  . darbepoetin (ARANESP) injection - DIALYSIS  100 mcg Intravenous Q Tue-HD  . famotidine  20 mg Per Tube QHS  . feeding supplement (PROSource TF)  90 mL Per Tube  TID  . free water  200 mL Per Tube Q4H  . heparin injection (subcutaneous)  5,000 Units Subcutaneous Q8H  . linagliptin  5 mg Per Tube Daily  . mouth rinse  15 mL Mouth Rinse 10 times per day  . midodrine  10 mg Per Tube TID WC  . minocycline  100 mg Per Tube Q12H  . sodium chloride flush  10-40 mL Intracatheter Q12H  . sodium chloride HYPERTONIC  4 mL Nebulization BID    have reviewed scheduled and prn medications.  Physical Exam: General: Critically ill looking male, has  tracheostomy, on vent, not in distress Heart: Normal rate, no  audible murmur Lungs: Coarse breath sound bilateral, bilateral chest rise, ventilated Abdomen:soft,  non-distended Extremities:No LE edema Dialysis Access: IJ temporary HD catheter  Kesia Dalto J Krisandra Bueno 11/22/2020,10:34 AM  LOS: 33 days

## 2020-11-22 NOTE — Evaluation (Addendum)
Occupational Therapy Evaluation Patient Details Name: Jackson Figueroa MRN: [id number] DOB: 06-Sep-1961 Today's Date: 11/22/2020    History of Present Illness 59 yo admitted 11/28 with respiratory distress and unresponsive at home with Covid 19. Pt with AKI and HD dependent. CRRT 12/16-12/26. Trach 12/23. encephalopathy since admission. PMHx: obesity   Clinical Impression   PTA, pt was independent and living with his girlfriend. Pt currently requiring Total A for ADLs and bed mobility. Use of kreg tilt bed for initiating standing position. Pt tolerating 10 minutes at 50 degrees; VSS throughout. Facilitating PROM of BUEs in standing. Pt would benefit from further acute OT to facilitate safe dc. Recommend dc to SNF for further OT to optimize safety, independence with ADLs, and return to PLOF.     Follow Up Recommendations  LTACH    Equipment Recommendations  Other (comment) (Defer to next venue)    Recommendations for Other Services PT consult     Precautions / Restrictions Precautions Precautions: Fall Precaution Comments: cortrak, trach collar, flexiseal      Mobility Bed Mobility Overal bed mobility: Needs Assistance Bed Mobility: Rolling Rolling: Total assist;+2 for physical assistance         General bed mobility comments: Total A for repositioning and rolling    Transfers                 General transfer comment: Use of kreg bed for supine to semi standing.    Balance                                           ADL either performed or assessed with clinical judgement   ADL Overall ADL's : Needs assistance/impaired                                       General ADL Comments: Total A for ADLs and bed mobility     Vision Baseline Vision/History: Wears glasses Wears Glasses: At all times Patient Visual Report: No change from baseline       Perception     Praxis      Pertinent Vitals/Pain Pain Assessment: No/denies pain      Hand Dominance Right   Extremity/Trunk Assessment Upper Extremity Assessment Upper Extremity Assessment: RUE deficits/detail;LUE deficits/detail RUE Deficits / Details: grossly 1/5 elbow extension, no noted grip, no elbow flexion, trace shoulder flexion RUE Coordination: decreased fine motor;decreased gross motor LUE Deficits / Details: grossly 1/5 elbow extension, weak 2-/5 grip, no elbow flexion, no shoulder flexion LUE Coordination: decreased fine motor;decreased gross motor   Lower Extremity Assessment Lower Extremity Assessment: Defer to PT evaluation       Communication Communication Communication: Tracheostomy   Cognition Arousal/Alertness: Awake/alert Behavior During Therapy: Flat affect Overall Cognitive Status: Difficult to assess Area of Impairment: Attention;Following commands                   Current Attention Level: Focused   Following Commands: Follows one step commands inconsistently       General Comments: Pt resting with head back and mouth open. Requiring increased time for follow cues.   General Comments       Exercises Exercises: Other exercises;General Upper Extremity General Exercises - Upper Extremity Shoulder Flexion: PROM;Both;10 reps;Standing Elbow Flexion: PROM;Both;10 reps;Standing Elbow Extension: PROM;Both;10 reps;Standing Wrist Flexion:  PROM;Both;10 reps;Standing Wrist Extension: PROM;Both;10 reps;Standing Digit Composite Flexion: PROM;Both;10 reps;Standing Composite Extension: PROM;Both;10 reps;Standing Other Exercises Other Exercises: Kreg tilt bed for supine to standing. 10 minutes in semi standing at 50*.   Shoulder Instructions      Home Living Family/patient expects to be discharged to:: Private residence Living Arrangements: Spouse/significant other Available Help at Discharge: Family;Available PRN/intermittently Type of Home: House Home Access: Stairs to enter     Home Layout: Multi-level (Baseline, main  level with mother in law suit, and bedroom on the second) Alternate Level Stairs-Number of Steps: Flight   Bathroom Shower/Tub: Producer, television/film/video: Standard     Home Equipment: Information systems manager - built in   Additional Comments: Information provided by girlfriend      Prior Functioning/Environment Level of Independence: Independent        Comments: 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 Problem List: Decreased strength;Decreased activity tolerance;Decreased range of motion;Impaired balance (sitting and/or standing);Decreased knowledge of use of DME or AE;Decreased knowledge of precautions      OT Treatment/Interventions: Self-care/ADL training;Therapeutic exercise;Energy conservation;DME and/or AE instruction;Therapeutic activities;Patient/family education    OT Goals(Current goals can be found in the care plan section) Acute Rehab OT Goals Patient Stated Goal: Go home OT Goal Formulation: With patient/family Time For Goal Achievement: 12/06/20 Potential to Achieve Goals: Good  OT Frequency: Min 2X/week   Barriers to D/C:            Co-evaluation              AM-PAC OT "6 Clicks" Daily Activity     Outcome Measure Help from another person eating meals?: Total Help from another person taking care of personal grooming?: Total Help from another person toileting, which includes using toliet, bedpan, or urinal?: Total Help from another person bathing (including washing, rinsing, drying)?: Total Help from another person to put on and taking off regular upper body clothing?: Total Help from another person to put on and taking off regular lower body clothing?: Total 6 Click Score: 6   End of Session Equipment Utilized During Treatment: Oxygen Nurse Communication: Mobility status;Other (comment) (Instructions for use of kreg bed)  Activity Tolerance: Patient tolerated treatment well Patient left: in bed;with call bell/phone within reach  OT Visit  Diagnosis: Other abnormalities of gait and mobility (R26.89);Unsteadiness on feet (R26.81);Muscle weakness (generalized) (M62.81)                Time: 2423-5361 OT Time Calculation (min): 45 min Charges:  OT General Charges $OT Visit: 1 Visit OT Evaluation $OT Eval High Complexity: 1 High OT Treatments $Therapeutic Activity: 23-37 mins  Kahliya Fraleigh MSOT, OTR/L Acute Rehab Pager: [phone] Office: [phone]  Theodoro Grist Arilynn Blakeney 11/22/2020, 5:18 PM

## 2020-11-22 NOTE — Progress Notes (Addendum)
NAME:  Jackson Figueroa, MRN:  [id number], DOB:  [birth year], LOS: 7 ADMISSION DATE:  10/20/2020, CONSULTATION DATE:  11/29 REFERRING MD:  Sabra Heck, CHIEF COMPLAINT:  ARDS COVID 3   Brief History   59 year old male with no significant past medical history and a positive COVID-19 test on 11/26 presented to [facility] with profound hypoxemia on 11/28.  Intubated immediately upon arrival found to be in ARDS. Transferred to [facility] long hospital for ongoing ICU care  11/29.  Past Medical History  Obesity  Significant Hospital Events   11/28 Presented to [facility] ER, intubated for hypoxemia, empiric abx, shock on levo. COVID Rx 11/29 Persistent shock, central line placed, ARDS protocol continued, proning initiated 11/30 Pressor requirements continued, NMB initiated to facilitate ventilation tube feeds started, initiated proning 12/01 Stopping NMB, proning positioning protocol discontinued, free water for rising creatinine and sodium 12/02 PEEP trending down.  Renal function improved. Changed to versed from prop gtt d/t rising triglycerides  12/03 Remdesivir completed. Peep/FIO2 stable. Free water adjusted. Solumedrol reduced, fentanyl to Dilaudid, adding Oxycodone  12/04 Dyssynchrony, given paralytic overnight, changed to ketamine; vomited / aspirated, restarted paralytics 12/06 Proned, under NMB 12/07 Proning, NMB continued. Switched antibiotics for stenotrophomonas 12/13 Stopped oxycodone, added dilaudid oral, continuous nimbex infusion to prn, palliative care consulted 12/14 Changed to pressure control 12/15 Worsening vent synchrony after clamping OG tube 12/18 added reglan IV for ? Ileus developing  12/18 back on paralytics for "guppy breathing/ air stacking" on PCV ? ,mucus plug  12/19  Back on PCV and off paralytics  12/20 having intermittent fevers, atrial fibrillation with some ST changes.  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 Started on minocycline per ID 12/22 stable decreased steroid dosing. Clonazepam added 12/23 trach placed. Versed off. Changed dilaudid from 4mg -.3mg , added clonidine and increased depakote. Added midodrine. OGT out during surg. Post-pyloric dislodged. Tip in stomach. Attempting trickle feeds w/ reglan  12/24 NG tube placed by IR 12/25 Dilaudid weaned to off, tolerated trach collar 12/26 tolerated weaning for about 4 hours on trach collar 12/30 G tube with IR Consults:   Nephrology 12/14 Palliative 12/13 ID signed off 12/17  Procedures:   ETT 11/28 >>trach 12/23 [facility] CVL 11/29 >> 12/9 R PICC 12/9 >>  R IJ HD Cath 12/17 >> G tube 12/30>> Significant Diagnostic Tests:   ECHO 11/29 >> LVEF 50-55%, mild LVH, RV systolic function mildly reduced. Mild dilation of ascending aorta 60mm. 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  Objective   Blood  pressure 121/75, pulse 97, temperature 99.3 F (37.4 C), temperature source Axillary, resp. rate (!) 26, height 6' (1.829 m), weight 110.9 kg, SpO2 98 %.    Vent Mode: PCV FiO2 (%):  [28 %-40 %] 40 % Set Rate:  [15 bmp] 15 bmp PEEP:  [5 cmH20] 5 cmH20   Intake/Output Summary (Last 24 hours) at 11/22/2020 [id number] Last data filed at 11/22/2020 0600 Gross per 24 hour  Intake 3044.03 ml  Output 925 ml  Net 2119.03 ml   Filed Weights   11/19/20 0500 11/21/20 0447 11/22/20 0500  Weight: 108.3 kg 108.5 kg 110.9 kg    Examination:  Constitutional: chronically ill, very debilitated appearing middle aged M, reclined in bed trach/vent NAD  HEENT: Anicteric sclera. Cortrak secure. Copious thick  purulent secretions from trach stoma. R IJ HD cath  Cardiovascular: RRR s1s2 no rgm cap refill < 3 sec  Respiratory: diminished, triggering vent Gastrointestinal: soft, hypoactive BS Skin: brown betadine discoloration over foot wounds  Neurologic: Awake alert. Following commands-- tracking, Shrugs and weakly nods/shakes head, Flicker BUE BLE.  Psychiatric: Calm, flat   Resolved Hospital Problem list   Post trach bleeding Thrombocytopenia (HIT neg)   Assessment & Plan:   Metabolic encephalopathy Multifactorial - ICU delirium / med side effects/ renal failure / infectious P -delirium precautions -minimal sedation as tolerated -HD and abx as below   ARDS due to COVID-19 pneumonia VDRF  S/p tracheostomy P -Continue attempts at TCT  -Pulm hygiene   MDR stenotrophomonas PNA -mino 12/21-12/28 Tracheal stoma infection - strep mitis/oralis, rare steno  P -restarting mino 12/31 -WOC following  AKI: Multifactorial ATN- now likely HD dependent, tx to [facility] 12/27 for iHD trial -has temp HD cath RIJ P -Nephro following -iHD per nephro  Profound deconditioning Inadequate PO intake in setting of trach/vent  S/p PEG with IR 12/30 P -EN per RDN -- PEG ok for use 12/31  -PT/OT -Will need  months of rehab to regain any functionality provided no further complications  Tissue injuries -sacrum, toes P -WOC following    L/T/D -cortrak - remove 12/31 now that G tube in place  -G tube placed 12/30, ok for use 12/31  -R IJ temp HD cath placed 12/16 -- remove 12/31  -RUE PICC 12/22 - remove if get another PIV   Best practice (evaluated daily)  Diet: Continue tube feeds Pain/Anxiety/Delirium protocol (if indicated): as above VAP protocol (if indicated): yes DVT prophylaxis: heparin sq  GI prophylaxis: famotidine Glucose control: off Mobility: bed rest Last date of multidisciplinary goals of care discussion 12/29 with sister.   Family and staff present nurse Summary of discussion: hope for as much functional recovery as possible Follow up goals of care discussion due: 11/28/19 Code Status: full code  Disposition: ICU  CRITICAL CARE Performed by: Cristal Generous   Total critical care time: 38  minutes  Critical care time was exclusive of separately billable procedures and treating other patients. Critical care was necessary to treat or prevent imminent or life-threatening deterioration.  Critical care was time spent personally by me on the following activities: development of treatment plan with patient and/or surrogate as well as nursing, discussions with consultants, evaluation of patient's response to treatment, examination of patient, obtaining history from patient or surrogate, ordering and performing treatments and interventions, ordering and review of laboratory studies, ordering and review of radiographic studies, pulse oximetry and re-evaluation of patient's condition.  Eliseo Gum MSN, AGACNP-BC [facility] [phone] If no answer, [phone] 11/22/2020, 8:06 AM

## 2020-11-22 NOTE — Progress Notes (Signed)
Palliative Medicine RN Note: Discussed pt in team rounds.  Family was very clear with Dr Neale Burly that they wish to continue aggressive care, and note that he is making progress, albeit very slowly. Our team will sign off. If his family asks to speak to [place] again, we will be happy to re-engage. However, at this time, there is no further role for PMT.  Margret Chance Evalena Fujii, RN, BSN, [facility] Palliative Medicine Team 11/22/2020 11:11 AM Office [phone]

## 2020-11-22 NOTE — Progress Notes (Signed)
IR.  History of dysphagia s/p percutaneous gastrostomy tube placement in IR 11/21/2020.  Patient awake and alert sitting in bed, eyes open, nonverbal but nods appropriately to yes/no questions. Gastrostomy tube site with minimal amounts of dried blood under bumper, no active bleeding, erythema, or drainage noted.  Gastrostomy tube stable- tube is ready for use, please ensure bumper is cinched to skin to prevent leakage. Further plans per CCM/nephrology- appreciate and agree with management. Please call IR with questions/concerns.    Waylan Boga Chavis Tessler, PA-C 11/22/2020, 10:45 AM

## 2020-11-23 LAB — CBC WITH DIFFERENTIAL/PLATELET
Abs Immature Granulocytes: 0.43 10*3/uL — ABNORMAL HIGH (ref 0.00–0.07)
Basophils Absolute: 0 10*3/uL (ref 0.0–0.1)
Basophils Relative: 0 %
Eosinophils Absolute: 0.1 10*3/uL (ref 0.0–0.5)
Eosinophils Relative: 1 %
HCT: 23.3 % — ABNORMAL LOW (ref 39.0–52.0)
Hemoglobin: 7.4 g/dL — ABNORMAL LOW (ref 13.0–17.0)
Immature Granulocytes: 4 %
Lymphocytes Relative: 7 %
Lymphs Abs: 0.8 10*3/uL (ref 0.7–4.0)
MCH: 32.2 pg (ref 26.0–34.0)
MCHC: 31.8 g/dL (ref 30.0–36.0)
MCV: 101.3 fL — ABNORMAL HIGH (ref 80.0–100.0)
Monocytes Absolute: 1.3 10*3/uL — ABNORMAL HIGH (ref 0.1–1.0)
Monocytes Relative: 10 %
Neutro Abs: 9.8 10*3/uL — ABNORMAL HIGH (ref 1.7–7.7)
Neutrophils Relative %: 78 %
Platelets: 196 10*3/uL (ref 150–400)
RBC: 2.3 MIL/uL — ABNORMAL LOW (ref 4.22–5.81)
RDW: 15 % (ref 11.5–15.5)
WBC: 12.5 10*3/uL — ABNORMAL HIGH (ref 4.0–10.5)
nRBC: 0 % (ref 0.0–0.2)

## 2020-11-23 LAB — RENAL FUNCTION PANEL
Albumin: 2.2 g/dL — ABNORMAL LOW (ref 3.5–5.0)
Albumin: 2.3 g/dL — ABNORMAL LOW (ref 3.5–5.0)
Anion gap: 12 (ref 5–15)
Anion gap: 12 (ref 5–15)
BUN: 119 mg/dL — ABNORMAL HIGH (ref 6–20)
BUN: 119 mg/dL — ABNORMAL HIGH (ref 6–20)
CO2: 20 mmol/L — ABNORMAL LOW (ref 22–32)
CO2: 20 mmol/L — ABNORMAL LOW (ref 22–32)
Calcium: 9.1 mg/dL (ref 8.9–10.3)
Calcium: 9.2 mg/dL (ref 8.9–10.3)
Chloride: 114 mmol/L — ABNORMAL HIGH (ref 98–111)
Chloride: 113 mmol/L — ABNORMAL HIGH (ref 98–111)
Creatinine, Ser: 3.38 mg/dL — ABNORMAL HIGH (ref 0.61–1.24)
Creatinine, Ser: 3.07 mg/dL — ABNORMAL HIGH (ref 0.61–1.24)
GFR, Estimated: 20 mL/min — ABNORMAL LOW (ref >60)
GFR, Estimated: 23 mL/min — ABNORMAL LOW (ref >60)
Glucose, Bld: 150 mg/dL — ABNORMAL HIGH (ref 70–99)
Glucose, Bld: 134 mg/dL — ABNORMAL HIGH (ref 70–99)
Phosphorus: 7.3 mg/dL — ABNORMAL HIGH (ref 2.5–4.6)
Phosphorus: 6.4 mg/dL — ABNORMAL HIGH (ref 2.5–4.6)
Potassium: 4.3 mmol/L (ref 3.5–5.1)
Potassium: 4.2 mmol/L (ref 3.5–5.1)
Sodium: 146 mmol/L — ABNORMAL HIGH (ref 135–145)
Sodium: 145 mmol/L (ref 135–145)

## 2020-11-23 LAB — GLUCOSE, CAPILLARY
Glucose-Capillary: 111 mg/dL — ABNORMAL HIGH (ref 70–99)
Glucose-Capillary: 117 mg/dL — ABNORMAL HIGH (ref 70–99)
Glucose-Capillary: 131 mg/dL — ABNORMAL HIGH (ref 70–99)
Glucose-Capillary: 143 mg/dL — ABNORMAL HIGH (ref 70–99)
Glucose-Capillary: 150 mg/dL — ABNORMAL HIGH (ref 70–99)
Glucose-Capillary: 156 mg/dL — ABNORMAL HIGH (ref 70–99)

## 2020-11-23 LAB — APTT: aPTT: 30 seconds (ref 24–36)

## 2020-11-23 LAB — MAGNESIUM: Magnesium: 2.6 mg/dL — ABNORMAL HIGH (ref 1.7–2.4)

## 2020-11-23 MED ORDER — FREE WATER
400.0000 mL | Status: DC
  Administered 2020-11-23 – 2020-11-26 (×27): 400 mL

## 2020-11-23 MED ORDER — ACETAMINOPHEN 160 MG/5ML PO SOLN
650.0000 mg | Freq: Four times a day (QID) | ORAL | Status: DC | PRN
  Administered 2020-11-23 – 2020-11-24 (×3): 650 mg
  Filled 2020-11-23 (×3): qty 20.3

## 2020-11-23 MED ORDER — DEXTROSE 5 % IV BOLUS
500.0000 mL | Freq: Once | INTRAVENOUS | Status: AC
  Administered 2020-11-23: 500 mL via INTRAVENOUS

## 2020-11-23 MED ORDER — SODIUM CHLORIDE 0.9 % IV SOLN
INTRAVENOUS | Status: DC | PRN
  Administered 2020-11-23 – 2020-12-05 (×3): 500 mL via INTRAVENOUS

## 2020-11-23 NOTE — Progress Notes (Signed)
eLink Physician-Brief Progress Note Patient Name: Jackson Figueroa DOB: May 21, 1961 MRN: [id number]   Date of Service  11/23/2020  HPI/Events of Note  Request for tylenol  eICU Interventions  Order placed     Intervention Category Minor Interventions: Routine modifications to care plan (e.g. PRN medications for pain, fever)  Oretha Milch 11/23/2020, 9:27 PM

## 2020-11-23 NOTE — Progress Notes (Signed)
[facility] NEPHROLOGY PROGRESS NOTE  Assessment/ Plan: Pt is a 60 y.o. yo male with history of COVID-19, ARDS, mechanical ventilation, shock with dialysis dependent AKI.  #Acute kidney injury: severe requiring dialysis. ATN due to septic shock, COVID-19 infection.  Kidney ultrasound ruled out obstruction.  Required CRRT from 12/16-12/26.  Since then intermittent hemodialysis, last HD on 12/28 with no UF.   -Appears dehydrated today w/ higher Na and BUN -Crt slightly better but BUN slightly higher today -Try hydration and CTM -Hold dialysis for now w/ hopeful recovery -Maintain dialysis catheter for now  #ARDS due to COVID-19 pneumonia: Required mechanical ventilation, ECMO.  Currently has tracheostomy on vent.  Per PCCM.  #Acute metabolic encephalopathy combination of medication, ICU delirium, sedatives.  Monitor mental status.  Opens eyes seems to be improving  #MDR stenotrophomonas pneumonia: Completed antibiotics per primary team.  #Severe physical deconditioning: PT OT, the team is planning PEG placement.  #Anemia due to critical illness: Transfusion as needed.  On Aranesp 100 mcg weekly  #Hypotension: Borderline BP.  BP much higher at this point. Will stop midodrine  #Hypernatremia: mild at 146. 500cc of d5w and increase fwf  Subjective: UOP continues to be good. Crt slightly less but Na and BUN higher today.  Objective Vital signs in last 24 hours: Vitals:   11/23/20 0500 11/23/20 0600 11/23/20 0700 11/23/20 0800  BP: (!) 147/80 136/79 (!) 144/77 (!) 151/84  Pulse: 82 82 82 92  Resp: (!) 21 (!) 26 20 (!) 25  Temp:    98.5 F (36.9 C)  TempSrc:    Axillary  SpO2: 97% 94% 97% 93%  Weight:      Height:       Weight change:   Intake/Output Summary (Last 24 hours) at 11/23/2020 0831 Last data filed at 11/23/2020 0800 Gross per 24 hour  Intake 1329.13 ml  Output 925 ml  Net 404.13 ml       Labs: Basic Metabolic Panel: Recent Labs  Lab  11/22/20 0601 11/22/20 1835 11/23/20 0148  NA 145 143 146*  K 4.5 4.2 4.3  CL 111 110 114*  CO2 20* 20* 20*  GLUCOSE 105* 139* 150*  BUN 112* 115* 119*  CREATININE 3.40* 3.34* 3.38*  CALCIUM 8.8* 8.7* 9.1  PHOS 8.6* 7.6* 7.3*   Liver Function Tests: Recent Labs  Lab 11/22/20 0601 11/22/20 1835 11/23/20 0148  ALBUMIN 2.1* 2.2* 2.2*   No results for input(s): LIPASE, AMYLASE in the last 168 hours. No results for input(s): AMMONIA in the last 168 hours. CBC: Recent Labs  Lab 11/19/20 0204 11/20/20 0441 11/21/20 0436 11/22/20 0601 11/23/20 0148  WBC 22.8* 13.7* 9.4 11.4* 12.5*  NEUTROABS 18.7* 9.9* 6.5 9.3* 9.8*  HGB 8.1* 8.1* 7.4* 7.5* 7.4*  HCT 25.6* 24.7* 24.0* 24.4* 23.3*  MCV 101.6* 100.0 102.1* 102.1* 101.3*  PLT 125* 118* 121* 175 196   Cardiac Enzymes: No results for input(s): CKTOTAL, CKMB, CKMBINDEX, TROPONINI in the last 168 hours. CBG: Recent Labs  Lab 11/22/20 1544 11/22/20 2233 11/23/20 0031 11/23/20 0447 11/23/20 0804  GLUCAP 132* 150* 150* 143* 156*    Iron Studies: No results for input(s): IRON, TIBC, TRANSFERRIN, FERRITIN in the last 72 hours. Studies/Results: IR GASTROSTOMY TUBE MOD SED  Result Date: 11/21/2020 INDICATION: Persistent dysphagia, in need of long-term nutrition. EXAM: Fluoroscopically guided placement of percutaneous pull-through gastrostomy tube Interventional Radiologist:  Criselda Peaches, MD MEDICATIONS: 2 g Ancef, 1 mg glucagon; Antibiotics were administered within 1 hour of the  procedure. ANESTHESIA/SEDATION: Versed 1 mg IV; Fentanyl 50 mcg IV Moderate Sedation Time:  13 minutes The patient was continuously monitored during the procedure by the interventional radiology nurse under my direct supervision. CONTRAST:  71m OMNIPAQUE IOHEXOL 300 MG/ML  SOLN FLUOROSCOPY TIME:  Fluoroscopy Time: 3 minutes 6 seconds (37 mGy). COMPLICATIONS: None immediate. PROCEDURE: Informed written consent was obtained from the patient after a  thorough discussion of the procedural risks, benefits and alternatives. All questions were addressed. Maximal Sterile Barrier Technique was utilized including caps, mask, sterile gowns, sterile gloves, sterile drape, hand hygiene and skin antiseptic. A timeout was performed prior to the initiation of the procedure. Maximal barrier sterile technique utilized including caps, mask, sterile gowns, sterile gloves, large sterile drape, hand hygiene, and chlorhexadine skin prep. An angled catheter was advanced over a wire under fluoroscopic guidance through the nose, down the esophagus and into the body of the stomach. The stomach was then insufflated with several 100 ml of air. Fluoroscopy confirmed location of the gastric bubble, as well as inferior displacement of the barium stained colon. Under direct fluoroscopic guidance, a single T-tack was placed, and the anterior gastric wall drawn up against the anterior abdominal wall. Percutaneous access was then obtained into the mid gastric body with an 18 gauge sheath needle. Aspiration of air, and injection of contrast material under fluoroscopy confirmed needle placement. An Amplatz wire was advanced in the gastric body and the access needle exchanged for a 9-French vascular sheath. A snare device was advanced through the vascular sheath and an Amplatz wire advanced through the angled catheter. The Amplatz wire was successfully snared and this was pulled up through the esophagus and out the mouth. A 20-French KAlinda DoomsMIC-PEG tube was then connected to the snare and pulled through the mouth, down the esophagus, into the stomach and out to the anterior abdominal wall. Hand injection of contrast material confirmed intragastric location. The T-tack retention suture was then cut. The pull through peg tube was then secured with the external bumper and capped. The patient will be observed for several hours with the newly placed tube on low wall suction to evaluate for any  post procedure complication. The patient tolerated the procedure well, there is no immediate complication. IMPRESSION: Successful placement of a 20 French pull through gastrostomy tube. Electronically Signed   By: HJacqulynn CadetM.D.   On: 11/21/2020 17:38    Medications: Infusions: . cefTRIAXone (ROCEPHIN)  IV Stopped (11/22/20 1405)  . feeding supplement (VITAL 1.5 CAL) 1,000 mL (11/23/20 0446)    Scheduled Medications: . albuterol  2.5 mg Nebulization QID  . chlorhexidine gluconate (MEDLINE KIT)  15 mL Mouth Rinse BID  . Chlorhexidine Gluconate Cloth  6 each Topical Q0600  . darbepoetin (ARANESP) injection - DIALYSIS  100 mcg Intravenous Q Tue-HD  . famotidine  20 mg Per Tube QHS  . feeding supplement (PROSource TF)  90 mL Per Tube TID  . free water  400 mL Per Tube Q3H  . heparin injection (subcutaneous)  5,000 Units Subcutaneous Q8H  . linagliptin  5 mg Per Tube Daily  . mouth rinse  15 mL Mouth Rinse 10 times per day  . midodrine  10 mg Per Tube TID WC  . minocycline  100 mg Per Tube Q12H  . sodium chloride flush  10-40 mL Intracatheter Q12H  . sodium chloride HYPERTONIC  4 mL Nebulization BID    have reviewed scheduled and prn medications.  Physical Exam: General: Critically ill looking male,  has  tracheostomy, on vent, not in distress Heart: Normal rate, no audible murmur Lungs: Coarse breath sound bilateral, bilateral chest rise, ventilated Abdomen:soft,  non-distended Extremities:No LE edema Dialysis Access: IJ temporary HD catheter  Shaune Pollack Weronika Birch 11/23/2020,8:31 AM  LOS: 34 days

## 2020-11-23 NOTE — Progress Notes (Signed)
Physical Therapy Treatment Patient Details Name: Jackson Figueroa MRN: [id number] DOB: [birth year] Today's Date: 11/23/2020    History of Present Illness 60 yo admitted 11/28 with respiratory distress and unresponsive at home with Covid 19. Pt with AKI and HD dependent. CRRT 12/16-12/26. Trach 12/23. encephalopathy since admission. PEG 12/30.PMHx: obesity    PT Comments    Pt with progression in function and bil LE strength this session. Pt activating hamstrings and quads on commands in supine and standing. Pt with limited to no activation of bil UE beyond grip on left hand this session. Pt able to tilt to 45 degrees and will need strapping at axilla next session as pt with tendency to engage abs to lean forward but lacks control into extension. Pt also with hyperextension of right knee in standing and needs towel roll at right knee for standing. Pt with increased alertness and nodding in standing with pt closing mouth in full standing. Will continue to follow.   Supine 135/78 15 degree tilt 140/95 30degree tilt 137/94 45 degrees after 10 total min in tilt 154/106 HR 97 SpO2 98% on 28% FiO2    Follow Up Recommendations  LTACH;Supervision/Assistance - 24 hour     Equipment Recommendations  Hospital bed;Wheelchair (measurements PT);Wheelchair cushion (measurements PT)    Recommendations for Other Services       Precautions / Restrictions Precautions Precautions: Fall Precaution Comments: PEG, trach collar, flexiseal Restrictions Weight Bearing Restrictions: No    Mobility  Bed Mobility Overal bed mobility: Needs Assistance Bed Mobility: Rolling Rolling: Total assist;+2 for physical assistance         General bed mobility comments: Total A for repositioning and rolling  Transfers Overall transfer level: Needs assistance   Transfers: Sit to/from Stand Sit to Stand: Total assist         General transfer comment: Kreg bed to tilt from supine to standing at 45  degrees  Ambulation/Gait             General Gait Details: unable   Stairs             Wheelchair Mobility    Modified Rankin (Stroke Patients Only)       Balance Overall balance assessment: Needs assistance         Standing balance support: No upper extremity supported;During functional activity Standing balance-Leahy Scale: Zero Standing balance comment: left lean in standing. Pt able to initiate core engagement to bring trunk forward and limited trunk extension to return to support against surface                            Cognition Arousal/Alertness: Awake/alert Behavior During Therapy: Flat affect Overall Cognitive Status: Difficult to assess Area of Impairment: Attention;Following commands                   Current Attention Level: Focused   Following Commands: Follows one step commands inconsistently       General Comments: able to bend knees with assist and assist with lifting legs on command. Delayed and weak grip on left to command. Delay grossly 15 sec with response to trunk positioning      Exercises General Exercises - Lower Extremity Heel Slides: AAROM;Both;Standing;5 reps    General Comments        Pertinent Vitals/Pain Pain Assessment: No/denies pain (CPOT = 0)    Home Living  Prior Function            PT Goals (current goals can now be found in the care plan section) Progress towards PT goals: Progressing toward goals    Frequency    Min 3X/week      PT Plan Current plan remains appropriate    Co-evaluation              AM-PAC PT "6 Clicks" Mobility   Outcome Measure  Help needed turning from your back to your side while in a flat bed without using bedrails?: Total Help needed moving from lying on your back to sitting on the side of a flat bed without using bedrails?: Total Help needed moving to and from a bed to a chair (including a wheelchair)?:  Total Help needed standing up from a chair using your arms (e.g., wheelchair or bedside chair)?: Total Help needed to walk in hospital room?: Total Help needed climbing 3-5 steps with a railing? : Total 6 Click Score: 6    End of Session   Activity Tolerance: Patient tolerated treatment well Patient left: in bed;with call bell/phone within reach Nurse Communication: Mobility status;Need for lift equipment PT Visit Diagnosis: Other abnormalities of gait and mobility (R26.89);Difficulty in walking, not elsewhere classified (R26.2);Other symptoms and signs involving the nervous system (R29.898);Muscle weakness (generalized) (M62.81)     Time: 4944-9675 PT Time Calculation (min) (ACUTE ONLY): 39 min  Charges:  $Therapeutic Activity: 38-52 mins                     Emogene Muratalla P, PT Acute Rehabilitation Services Pager: [phone] Office: [phone]    Jaslene Marsteller B Onesimo Lingard 11/23/2020, 10:39 AM

## 2020-11-23 NOTE — Progress Notes (Signed)
NAME:  Jackson Figueroa, MRN:  [id number], DOB:  Jul 28, 1961, LOS: 34 ADMISSION DATE:  10/20/2020, CONSULTATION DATE:  11/29 REFERRING MD:  Hyacinth Meeker, CHIEF COMPLAINT:  ARDS COVID 4   Brief History   60 year old male with no significant past medical history and a positive COVID-19 test on 11/26 presented to [facility] with profound hypoxemia on 11/28.  Intubated immediately upon arrival found to be in ARDS. Transferred to [facility] long hospital for ongoing ICU care  11/29.  Past Medical History  Obesity  Significant Hospital Events   11/28 Presented to [facility] ER, intubated for hypoxemia, empiric abx, shock on levo. COVID Rx 11/29 Persistent shock, central line placed, ARDS protocol continued, proning initiated 11/30 Pressor requirements continued, NMB initiated to facilitate ventilation tube feeds started, initiated proning 12/01 Stopping NMB, proning positioning protocol discontinued, free water for rising creatinine and sodium 12/02 PEEP trending down.  Renal function improved. Changed to versed from prop gtt d/t rising triglycerides  12/03 Remdesivir completed. Peep/FIO2 stable. Free water adjusted. Solumedrol reduced, fentanyl to Dilaudid, adding Oxycodone  12/04 Dyssynchrony, given paralytic overnight, changed to ketamine; vomited / aspirated, restarted paralytics 12/06 Proned, under NMB 12/07 Proning, NMB continued. Switched antibiotics for stenotrophomonas 12/13 Stopped oxycodone, added dilaudid oral, continuous nimbex infusion to prn, palliative care consulted 12/14 Changed to pressure control 12/15 Worsening vent synchrony after clamping OG tube 12/18 added reglan IV for ? Ileus developing  12/18 back on paralytics for "guppy breathing/ air stacking" on PCV ? ,mucus plug  12/19  Back on PCV and off paralytics  12/20 having intermittent fevers, atrial fibrillation with some ST changes.  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 Started on minocycline per ID 12/22 stable decreased steroid dosing. Clonazepam added 12/23 trach placed. Versed off. Changed dilaudid from 4mg -.3mg , added clonidine and increased depakote. Added midodrine. OGT out during surg. Post-pyloric dislodged. Tip in stomach. Attempting trickle feeds w/ reglan  12/24 NG tube placed by IR 12/25 Dilaudid weaned to off, tolerated trach collar 12/26 tolerated weaning for about 4 hours on trach collar 12/30 G tube with IR Consults:   Nephrology 12/14 Palliative 12/13 ID signed off 12/17  Procedures:   ETT 11/28 >>trach 12/23 Bates CVL 11/29 >> 12/9 R PICC 12/9 >>  R IJ HD Cath 12/17 >> G tube 12/30>> Significant Diagnostic Tests:   ECHO 11/29 >> LVEF 50-55%, mild LVH, RV systolic function mildly reduced. Mild dilation of ascending aorta 38mm. LE Venous Duplex 12/8 >> negative for DVT   Micro Data:   COVID 11/28 >> Positive  Influenza A/B 11/28 >> negative  UC 11/28 >> negative  BCx2 11/28 >> negative  Tracheal aspirate 12/5 >> Stenotrophomonas maltophilia >> S-bactrim, levofloxacin UC 12/11 >> negative  Tracheal aspirate 12/11 >> Stenotrophomonas maltophilia >> S-bactrim, levofloxacin  BCx2 12/11 >> negative  Trach asp 12/ 19 >>> multidrug resistance stenotrophomonas Tracheal stoma 12/28> MDR moderate strep mitis/oralis, rare steno   Antimicrobials/covid rx:   CTX 11/28 > 11/29 Azithromycin 11/28 > 11/29 Remdesivir 11/28 > 12/2 Prednisone 11/28 >  Baricitinib 12/4 > 12/5 Vanco 12/5 >> 12/7, 12/11 x1 Cefepime 12/5 >> 12/7, 12/11 >> 12/13 Levaquin 12/7 >> 12/16 Minocycline 12/21>12/28, 12/31>>   Rocephin 12/31>>  Interim history/subjective:   Trach collar majority of 12/31 day, 12/31 night and continues on trach collar this morning (1/1)   Started back on  mino  yesterday (12/31) for steno, rocephin for mdr strep   HD cath and PICC removed 12/31   Objective   Blood pressure 136/79, pulse 82, temperature 98.6 F (37 C), temperature source Axillary, resp. rate (!) 26, height 6' (1.829 m), weight 110.9 kg, SpO2 94 %.    FiO2 (%):  [28 %] 28 %   Intake/Output Summary (Last 24 hours) at 11/23/2020 0727 Last data filed at 11/23/2020 0600 Gross per 24 hour  Intake 1029.72 ml  Output 925 ml  Net 104.72 ml   Filed Weights   11/19/20 0500 11/21/20 0447 11/22/20 0500  Weight: 108.3 kg 108.5 kg 110.9 kg    Examination:  Constitutional: chronically ill, debilitated appearing middle aged M trach collar reclined in bed  HEENT: Anicteric sclera. Trach secure, thick purulent secretions around trach.  Cardiovascular: rrr s1s2 no rgm  Respiratory: Symmetrical chest expansion, no accessory muscle use on TC, diminished bibasilar sounds  Gastrointestinal: soft ndnt + Gtube Skin: betadine discoloration over foot wounds. Pale, clean, dry Neurologic: Awake alert following commands, flicker BUE BLE. PERRL Psychiatric: Calm, cooperative, flat   Resolved Hospital Problem list   Post trach bleeding Thrombocytopenia (HIT neg)   Assessment & Plan:   Metabolic encephalopathy, improving Multifactorial - ICU delirium / med side effects/ renal failure / infectious P -delirium precautions -minimal sedation as tolerated -HD and abx as below   ARDS due to COVID-19 pneumonia S/p tracheostomy -trach collar started 12/31 1000 P -Continue Trach collar. If can remain on > 24 hr consider transfer out of ICU  -Pulm hygiene, CPT, hypertonic   MDR stenotrophomonas PNA -mino 12/21-12/28 Tracheal stoma infection - mdr strep mitis/oralis, rare steno  P  -Mino, rocephin  -WOC following  AKI: Multifactorial ATN -RIJ HD cath removed 12/31  Hypernatremia, mild P -Nephro following -if requires further HD, tunneled HD cath  -getting bolus of D5, incr FWF   Profound  deconditioning Inadequate PO intake in setting of trach/vent  S/p PEG with IR 12/30 P -EN per RDN -- PEG ok for use 12/31  -PT/OT -Will need months of rehab to regain any functionality provided no further complications -Kreg bed   Tissue injuries -sacrum, toes P -WOC following   Anemia -critical illness, lab draws P -stable, continue to trend  -aranesp 123mcg weekly    L/T/D -Central access (PICC, temp HD cath) removed 12/31 -G tube placed 12/30, ok for use 12/31  -Trach by ENT    Best practice (evaluated daily)  Diet: Continue tube feeds Pain/Anxiety/Delirium protocol (if indicated): as above VAP protocol (if indicated): yes DVT prophylaxis: heparin sq  GI prophylaxis: famotidine Glucose control: off Mobility: bed rest Last date of multidisciplinary goals of care discussion 12/29 with sister.   Family and staff present nurse Summary of discussion: hope for as much functional recovery as possible Follow up goals of care discussion due: 11/28/19 Code Status: full code  Disposition: ICU  CRITICAL CARE Performed by: Cristal Generous   Total critical care time: 33 minutes  Critical care time was exclusive of separately billable procedures and treating other patients.  Critical care was necessary to treat or prevent imminent or life-threatening deterioration.  Critical care was time spent personally by me on the following activities: development of treatment plan with patient and/or surrogate as well as nursing, discussions with consultants, evaluation of patient's response to treatment, examination of patient, obtaining history from patient or surrogate, ordering and performing treatments and interventions, ordering and review of laboratory studies, ordering and  review of radiographic studies, pulse oximetry and re-evaluation of patient's condition.  Eliseo Gum MSN, AGACNP-BC [place] [id number] If no answer, [phone] 11/23/2020,  10:09 AM

## 2020-11-24 LAB — RENAL FUNCTION PANEL
Albumin: 2.3 g/dL — ABNORMAL LOW (ref 3.5–5.0)
Anion gap: 14 (ref 5–15)
BUN: 117 mg/dL — ABNORMAL HIGH (ref 6–20)
CO2: 19 mmol/L — ABNORMAL LOW (ref 22–32)
Calcium: 9.1 mg/dL (ref 8.9–10.3)
Chloride: 111 mmol/L (ref 98–111)
Creatinine, Ser: 2.86 mg/dL — ABNORMAL HIGH (ref 0.61–1.24)
GFR, Estimated: 25 mL/min — ABNORMAL LOW (ref >60)
Glucose, Bld: 132 mg/dL — ABNORMAL HIGH (ref 70–99)
Phosphorus: 6.3 mg/dL — ABNORMAL HIGH (ref 2.5–4.6)
Potassium: 4.2 mmol/L (ref 3.5–5.1)
Sodium: 144 mmol/L (ref 135–145)

## 2020-11-24 LAB — GLUCOSE, CAPILLARY
Glucose-Capillary: 119 mg/dL — ABNORMAL HIGH (ref 70–99)
Glucose-Capillary: 128 mg/dL — ABNORMAL HIGH (ref 70–99)
Glucose-Capillary: 137 mg/dL — ABNORMAL HIGH (ref 70–99)
Glucose-Capillary: 119 mg/dL — ABNORMAL HIGH (ref 70–99)
Glucose-Capillary: 130 mg/dL — ABNORMAL HIGH (ref 70–99)
Glucose-Capillary: 109 mg/dL — ABNORMAL HIGH (ref 70–99)
Glucose-Capillary: 114 mg/dL — ABNORMAL HIGH (ref 70–99)

## 2020-11-24 LAB — CULTURE, RESPIRATORY W GRAM STAIN

## 2020-11-24 MED ORDER — QUETIAPINE FUMARATE 50 MG PO TABS
50.0000 mg | ORAL_TABLET | Freq: Every day | ORAL | Status: DC
  Administered 2020-11-24 – 2020-11-28 (×5): 50 mg via ORAL
  Filled 2020-11-24 (×5): qty 1

## 2020-11-24 NOTE — Progress Notes (Signed)
[facility] NEPHROLOGY PROGRESS NOTE  Assessment/ Plan: Pt is a 60 y.o. yo male with history of COVID-19, ARDS, mechanical ventilation, shock with dialysis dependent AKI.  #Acute kidney injury: severe requiring dialysis. ATN due to septic shock, COVID-19 infection.  Kidney ultrasound ruled out obstruction.  Required CRRT from 12/16-12/26.  Since then intermittent hemodialysis, last HD on 12/28 with no UF.   -Hydration yesterday and labs improved. IVFs prn -Crt and BUN downtrending slowly -Continue to hold dialysis for now w/ hopeful recovery -No HD access at this time; [facility] if needed  #ARDS due to COVID-19 pneumonia: Required mechanical ventilation, ECMO.  Currently has tracheostomy on vent.  Per PCCM.  #Acute metabolic encephalopathy combination of medication, ICU delirium, sedatives.  Monitor mental status.  Overall imrpving.  #Severe physical deconditioning: PT OT, the team is planning PEG placement.  #Anemia due to critical illness: Transfusion as needed.  On Aranesp 100 mcg weekly  #Hypotension: Borderline BP.  BP stale off midodrine  #Hypernatremia: mild elevation yesterday now improved with hydration  Subjective: UOP continues to be good. Crt and BUN lower today. Na improved.  Objective Vital signs in last 24 hours: Vitals:   11/24/20 0330 11/24/20 0700 11/24/20 0800 11/24/20 0830  BP:  (!) 145/114 139/85   Pulse:  90 90   Resp:  (!) 24 (!) 28   Temp: 98.9 F (37.2 C) 98.5 F (36.9 C)    TempSrc: Axillary Axillary    SpO2:  100% 100% 100%  Weight:      Height:       Weight change:   Intake/Output Summary (Last 24 hours) at 11/24/2020 0840 Last data filed at 11/24/2020 0800 Gross per 24 hour  Intake 4751.09 ml  Output 1908 ml  Net 2843.09 ml       Labs: Basic Metabolic Panel: Recent Labs  Lab 11/23/20 0148 11/23/20 1657 11/24/20 0342  NA 146* 145 144  K 4.3 4.2 4.2  CL 114* 113* 111  CO2 20* 20* 19*  GLUCOSE 150* 134* 132*  BUN 119*  119* 117*  CREATININE 3.38* 3.07* 2.86*  CALCIUM 9.1 9.2 9.1  PHOS 7.3* 6.4* 6.3*   Liver Function Tests: Recent Labs  Lab 11/23/20 0148 11/23/20 1657 11/24/20 0342  ALBUMIN 2.2* 2.3* 2.3*   No results for input(s): LIPASE, AMYLASE in the last 168 hours. No results for input(s): AMMONIA in the last 168 hours. CBC: Recent Labs  Lab 11/19/20 0204 11/20/20 0441 11/21/20 0436 11/22/20 0601 11/23/20 0148  WBC 22.8* 13.7* 9.4 11.4* 12.5*  NEUTROABS 18.7* 9.9* 6.5 9.3* 9.8*  HGB 8.1* 8.1* 7.4* 7.5* 7.4*  HCT 25.6* 24.7* 24.0* 24.4* 23.3*  MCV 101.6* 100.0 102.1* 102.1* 101.3*  PLT 125* 118* 121* 175 196   Cardiac Enzymes: No results for input(s): CKTOTAL, CKMB, CKMBINDEX, TROPONINI in the last 168 hours. CBG: Recent Labs  Lab 11/23/20 1547 11/23/20 2002 11/23/20 2358 11/24/20 0332 11/24/20 0751  GLUCAP 117* 131* 119* 128* 137*    Iron Studies: No results for input(s): IRON, TIBC, TRANSFERRIN, FERRITIN in the last 72 hours. Studies/Results: No results found.  Medications: Infusions: . sodium chloride Stopped (11/23/20 1257)  . cefTRIAXone (ROCEPHIN)  IV Stopped (11/23/20 1159)  . feeding supplement (VITAL 1.5 CAL) 1,000 mL (11/24/20 0129)    Scheduled Medications: . albuterol  2.5 mg Nebulization QID  . chlorhexidine gluconate (MEDLINE KIT)  15 mL Mouth Rinse BID  . Chlorhexidine Gluconate Cloth  6 each Topical Q0600  . darbepoetin (ARANESP) injection -  DIALYSIS  100 mcg Intravenous Q Tue-HD  . famotidine  20 mg Per Tube QHS  . feeding supplement (PROSource TF)  90 mL Per Tube TID  . free water  400 mL Per Tube Q3H  . heparin injection (subcutaneous)  5,000 Units Subcutaneous Q8H  . linagliptin  5 mg Per Tube Daily  . mouth rinse  15 mL Mouth Rinse 10 times per day  . minocycline  100 mg Per Tube Q12H  . QUEtiapine  50 mg Oral QHS  . sodium chloride flush  10-40 mL Intracatheter Q12H  . sodium chloride HYPERTONIC  4 mL Nebulization BID    have reviewed  scheduled and prn medications.  Physical Exam: General: Critically ill looking male, has  tracheostomy, on vent, not in distress Heart: Normal rate, no audible murmur Lungs: Coarse breath sound bilateral, bilateral chest rise Abdomen:soft,  non-distended Extremities:No LE edema Dialysis Access: none  Shaune Pollack Rae Plotner 11/24/2020,8:40 AM  LOS: 35 days

## 2020-11-24 NOTE — Progress Notes (Signed)
NAME:  Jackson Figueroa, MRN:  [id number], DOB:  25-May-1961, LOS: 35 ADMISSION DATE:  10/20/2020, CONSULTATION DATE:  11/29 REFERRING MD:  Hyacinth Meeker, CHIEF COMPLAINT:  ARDS COVID 58   Brief History:  60 year old male with no significant past medical history and a positive COVID-19 test on 11/26 presented to [facility] with profound hypoxemia on 11/28. Intubated immediately upon arrival found to be in ARDS. Transferred to [facility] long hospital for ongoing ICU care  11/29.  Past Medical History:  Obesity   Significant Hospital Events:  11/28 Presented to [facility] ER, intubated for hypoxemia, empiric abx, shock on levo. COVID Rx 11/29 Persistent shock, central line placed, ARDS protocol continued, proning initiated 11/30 Pressor requirements continued, NMB initiated to facilitate ventilation tube feeds started, initiated proning 12/01 Stopping NMB, proning positioning protocol discontinued, free water for rising creatinine and sodium 12/02 PEEP trending down. Renal function improved. Changed to versed from prop gtt d/t rising triglycerides  12/03 Remdesivir completed. Peep/FIO2 stable. Free water adjusted. Solumedrol reduced, fentanyl to Dilaudid, adding Oxycodone 12/04 Dyssynchrony, given paralytic overnight, changed to ketamine; vomited / aspirated, restarted paralytics 12/06 Proned, under NMB 12/07 Proning, NMB continued. Switched antibiotics for stenotrophomonas 12/13 Stopped oxycodone, added dilaudid oral, continuous nimbex infusion to prn, palliative care consulted 12/14 Changed to pressure control 12/15 Worsening vent synchrony after clamping OG tube 12/18 added reglan IV for ? Ileus developing  12/18 back on paralytics for "guppy breathing/ air stacking" on PCV ? ,mucus plug  12/19  Back on PCV and off paralytics  12/20 having intermittent fevers, atrial fibrillation with some ST changes.  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 Started on minocycline per ID 12/22 stable decreased steroid dosing. Clonazepam added 12/23 trach placed. Versed off. Changed dilaudid from 4mg -.3mg , added clonidine and increased depakote. Added midodrine. OGT out during surg. Post-pyloric dislodged. Tip in stomach. Attempting trickle feeds w/ reglan  12/24 NG tube placed by IR 12/25 Dilaudid weaned to off, tolerated trach collar 12/26 tolerated weaning for about 4 hours on trach collar 12/30 G tube with IR  Consults:   Nephrology 12/14 Palliative 12/13 ID signed off 12/17  Procedures:  ETT 11/28 >>trach 12/23 Bates CVL 11/29 >> 12/9 R PICC 12/9 >>  R IJ HD Cath 12/17 >> G tube 12/30>>  Significant Diagnostic Tests:   ECHO 11/29 >>LVEF 50-55%, mild LVH, RV systolic function mildly reduced. Mild dilation of ascending aorta 33mm. 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 rate (!) 33, height 6' (1.829 m), weight 110.9 kg, SpO2 98 %.    Vent Mode: PCV FiO2 (%):  [28 %-40 %] 40 % Set Rate:  [15 bmp] 15 bmp PEEP:  [5 cmH20] 5 cmH20 Plateau Pressure:  [18 cmH20] 18 cmH20   Intake/Output Summary (Last 24 hours) at 11/24/2020 [id number] Last data filed at 11/24/2020 0600 Gross per 24 hour  Intake 5070.53 ml  Output 1941.3 ml  Net 3129.23 ml   Filed Weights   11/19/20 0500 11/21/20 0447 11/22/20 0500  Weight: 108.3 kg 108.5 kg 110.9 kg    Examination:  General:  In bed on vent HENT: NCAT Tracheostomy in place PULM: Rhonchi bilaterally B, vent supported breathing CV: RRR, no mgr GI: BS+, soft, nontender MSK: normal bulk and tone Neuro: awake, looks at me when I call his name, muscle weakness   Resolved Hospital Problem list     Assessment & Plan:   Metabolic encephalopathy Try to re-establish sleep wake cycle Start seroquel qHS  ARDS due to COVID 19 Trach care per routine Move to trach collar again today When using full vent support, use pressure control ventilation  MDR Stenoptrophomonas pneumonia Ceftriaxone and minocycline per ID  AKI in HD HD decision per renal Hoping for renal recovery  Profound deconditioning PT following  Anemia of chronic illness Monitor for bleeding Transfuse PRBC for Hgb < 7 gm/dL  Tissue injury sacrum, toes Wound care   Best practice (evaluated daily)  Diet: tube feeding Pain/Anxiety/Delirium protocol (if indicated): no PAD, minimize sedation VAP protocol (if indicated): yes DVT prophylaxis: Sub q heparin GI prophylaxis: Pantoprazole for stress ulcer prophylaxis Glucose control: monitor Mobility: PT working with patient Disposition: LTACH  Goals of Care:  Last date of multidisciplinary goals of care discussion: 12/29 Family and staff present: sister, Bowser Summary of discussion: full code, hoping for functional recovery Follow up  goals of care discussion due: 1/5 Code Status: full  Labs   CBC: Recent Labs  Lab 11/19/20 0204 11/20/20 0441 11/21/20 0436 11/22/20 0601 11/23/20 0148  WBC 22.8* 13.7* 9.4 11.4* 12.5*  NEUTROABS 18.7* 9.9* 6.5 9.3* 9.8*  HGB 8.1* 8.1* 7.4* 7.5* 7.4*  HCT 25.6* 24.7* 24.0* 24.4* 23.3*  MCV 101.6* 100.0 102.1* 102.1* 101.3*  PLT 125* 118* 121* [id number]    Basic Metabolic Panel: Recent Labs  Lab 11/19/20 0204 11/19/20 0328 11/20/20 0441 11/20/20 1734 11/21/20 0436 11/21/20 1600 11/22/20 0601 11/22/20 1835 11/23/20 0148 11/23/20 1657 11/24/20 0342  NA 135   < > 136   < > 140   < > 145 143 146* 145 144  K 6.1*   < > 3.7   < > 3.9   < > 4.5 4.2 4.3 4.2 4.2  CL 101   < > 99   < > 104   < > 111 110 114* 113* 111  CO2 20*   < > 24   < > 22   < > 20* 20* 20* 20* 19*  GLUCOSE 137*   < > 103*   < > 125*   < > 105* 139* 150* 134* 132*  BUN 106*   < > 62*   < > 102*   < > 112* 115* 119* 119* 117*  CREATININE 3.34*   < > 2.34*   < > 3.15*   < > 3.40* 3.34* 3.38* 3.07* 2.86*  CALCIUM 9.3   < > 8.4*   < > 8.6*   < >  8.8* 8.7* 9.1 9.2 9.1  MG 3.1*  --  2.4  --  2.5*  --  2.7*  --  2.6*  --   --   PHOS 8.2*   < > 6.2*   < > 8.6*   < > 8.6* 7.6* 7.3* 6.4* 6.3*   < > = values in this interval not displayed.   GFR: Estimated Creatinine Clearance: 36.6 mL/min (A) (by C-G formula based on SCr of 2.86 mg/dL (H)). Recent Labs  Lab 11/20/20 0441 11/21/20 0436 11/22/20 0601 11/23/20 0148  WBC 13.7* 9.4 11.4* 12.5*    Liver Function Tests: Recent Labs  Lab 11/22/20 0601 11/22/20 1835 11/23/20 0148 11/23/20 1657 11/24/20 0342  ALBUMIN 2.1* 2.2* 2.2* 2.3* 2.3*   No results for input(s): LIPASE, AMYLASE in the last 168 hours. No results for input(s): AMMONIA in the last 168 hours.  ABG    Component Value Date/Time   PHART 7.461 (H) 11/17/2020 1155   PCO2ART 35.7 11/17/2020 1155   PO2ART 70.8 (L) 11/17/2020 1155   HCO3 25.1 11/17/2020 1155   ACIDBASEDEF 1.3 11/10/2020 0625    O2SAT 93.8 11/17/2020 1155     Coagulation Profile: Recent Labs  Lab 11/21/20 0954  INR 1.1    Cardiac Enzymes: No results for input(s): CKTOTAL, CKMB, CKMBINDEX, TROPONINI in the last 168 hours.  HbA1C: Hgb A1c MFr Bld  Date/Time Value Ref Range Status  10/22/2020 03:38 AM 5.3 4.8 - 5.6 % Final    Comment:    (NOTE)         Prediabetes: 5.7 - 6.4         Diabetes: >6.4         Glycemic control for adults with diabetes: <7.0     CBG: Recent Labs  Lab 11/23/20 1211 11/23/20 1547 11/23/20 2002 11/23/20 2358 11/24/20 0332  GLUCAP 111* 117* 131* 119* 128*     Critical care time: 35 minutes     Roselie Awkward, MD [place] Pager: [phone] Cell: [phone] If no response, call [phone]

## 2020-11-25 LAB — RENAL FUNCTION PANEL
Albumin: 2.2 g/dL — ABNORMAL LOW (ref 3.5–5.0)
Anion gap: 14 (ref 5–15)
BUN: 113 mg/dL — ABNORMAL HIGH (ref 6–20)
CO2: 18 mmol/L — ABNORMAL LOW (ref 22–32)
Calcium: 9.1 mg/dL (ref 8.9–10.3)
Chloride: 109 mmol/L (ref 98–111)
Creatinine, Ser: 2.3 mg/dL — ABNORMAL HIGH (ref 0.61–1.24)
GFR, Estimated: 32 mL/min — ABNORMAL LOW (ref >60)
Glucose, Bld: 139 mg/dL — ABNORMAL HIGH (ref 70–99)
Phosphorus: 6.5 mg/dL — ABNORMAL HIGH (ref 2.5–4.6)
Potassium: 4.1 mmol/L (ref 3.5–5.1)
Sodium: 141 mmol/L (ref 135–145)

## 2020-11-25 LAB — GLUCOSE, CAPILLARY
Glucose-Capillary: 104 mg/dL — ABNORMAL HIGH (ref 70–99)
Glucose-Capillary: 114 mg/dL — ABNORMAL HIGH (ref 70–99)
Glucose-Capillary: 119 mg/dL — ABNORMAL HIGH (ref 70–99)
Glucose-Capillary: 131 mg/dL — ABNORMAL HIGH (ref 70–99)
Glucose-Capillary: 84 mg/dL (ref 70–99)

## 2020-11-25 MED ORDER — DARBEPOETIN ALFA 100 MCG/0.5ML IJ SOSY
100.0000 ug | PREFILLED_SYRINGE | INTRAMUSCULAR | Status: DC
  Administered 2020-11-26 – 2020-12-03 (×2): 100 ug via SUBCUTANEOUS
  Filled 2020-11-25 (×2): qty 0.5

## 2020-11-25 NOTE — Progress Notes (Signed)
Physical Therapy Treatment Patient Details Name: Jackson Figueroa MRN: [id number] DOB: Jan 04, 1961 Today's Date: 11/25/2020    History of Present Illness 60 yo admitted 11/28 with respiratory distress and unresponsive at home with Covid 19. Pt with AKI and HD dependent. CRRT 12/16-12/26. Trach 12/23. encephalopathy since admission. PEG 12/30.PMHx: obesity    PT Comments    Pt pleasant, awake and responding with mouthing words but unintelligible. Pt able to follow commands to lift head from surface and move bil feet but limited engagement in rest of legs and nothing of notable movement in arms today. Pt tolerated tilting from 15-40 degrees for 15 min total limited by HR up to 135. Will continue to follow. Pt positioned in chair position end of session.   Supine 140/84 30 degrees 128/84, HR 122 40 degrees 124/82, HR 129-135, 98% SpO2, with FiO2 40% on vent PC  end of session HR 113, SpO2 97%   Follow Up Recommendations  LTACH;Supervision/Assistance - 24 hour     Equipment Recommendations  Hospital bed;Wheelchair (measurements PT);Wheelchair cushion (measurements PT)    Recommendations for Other Services       Precautions / Restrictions Precautions Precautions: Fall Precaution Comments: PEG, trach, vent, flexiseal Restrictions Weight Bearing Restrictions: No    Mobility  Bed Mobility Overal bed mobility: Needs Assistance       Supine to sit: Total assist     General bed mobility comments: total assist with use of bed to transition from supine to sitting  Transfers                 General transfer comment: Kreg bed to tilt from supine to standing at 40 degrees  Ambulation/Gait                 Stairs             Wheelchair Mobility    Modified Rankin (Stroke Patients Only)       Balance Overall balance assessment: Needs assistance   Sitting balance-Leahy Scale: Zero Sitting balance - Comments: trunk supported on surface in sitting      Standing balance-Leahy Scale: Zero Standing balance comment: pt with strapping of trunk and legs in standing                            Cognition Arousal/Alertness: Awake/alert Behavior During Therapy: Flat affect Overall Cognitive Status: Difficult to assess Area of Impairment: Attention;Following commands                   Current Attention Level: Focused   Following Commands: Follows one step commands inconsistently       General Comments: pt lifing head on command and moving feet. No active movement bil UE this session, limited command following with hip movement no quad activation on command      Exercises General Exercises - Lower Extremity Ankle Circles/Pumps: AAROM;Both;10 reps;Supine Heel Slides: AAROM;Both;10 reps;Supine Hip ABduction/ADduction: Both;10 reps;PROM;Supine    General Comments        Pertinent Vitals/Pain Pain Assessment: No/denies pain    Home Living                      Prior Function            PT Goals (current goals can now be found in the care plan section) Progress towards PT goals: Progressing toward goals    Frequency    Min 3X/week  PT Plan Current plan remains appropriate    Co-evaluation              AM-PAC PT "6 Clicks" Mobility   Outcome Measure  Help needed turning from your back to your side while in a flat bed without using bedrails?: Total Help needed moving from lying on your back to sitting on the side of a flat bed without using bedrails?: Total Help needed moving to and from a bed to a chair (including a wheelchair)?: Total Help needed standing up from a chair using your arms (e.g., wheelchair or bedside chair)?: Total Help needed to walk in hospital room?: Total Help needed climbing 3-5 steps with a railing? : Total 6 Click Score: 6    End of Session   Activity Tolerance: Patient tolerated treatment well Patient left: in bed;with call bell/phone within reach (in  chair position) Nurse Communication: Mobility status;Need for lift equipment PT Visit Diagnosis: Other abnormalities of gait and mobility (R26.89);Difficulty in walking, not elsewhere classified (R26.2);Other symptoms and signs involving the nervous system (R29.898);Muscle weakness (generalized) (M62.81)     Time: 1027-2536 PT Time Calculation (min) (ACUTE ONLY): 42 min  Charges:  $Therapeutic Exercise: 8-22 mins $Therapeutic Activity: 23-37 mins                     Jackson Figueroa P, PT Acute Rehabilitation Services Pager: [phone] Office: [phone]    Wilbon Obenchain B Pegah Segel 11/25/2020, 10:57 AM

## 2020-11-25 NOTE — Consult Note (Signed)
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 Great improvement. No need for further treatment.  #3 Black and purple wounds to the left great toe anterior and posterior and abrasions to the right great toe. Continue betadine.  #4 MASD to the trach collar surrounding area, which has extensive secretions and excoriated skin. Continue Drawtex split gauze.  #5 Partial thickness MASD left posterior proximal thigh just below the gluteal fold. Pink moist. May use Desitin PRN or Foam dressing.  Pressure Injury POA: No Drainage (amount, consistency, odor) None Dressing procedure/placement/frequency: Continue previous orders.   Monitor the wound area(s) for worsening of condition such as: Signs/symptoms of infection, increase in size, development of or worsening of odor, development of pain, or increased pain at the affected locations.   Notify the medical team if any of these develop.  Thank you for the consult. WOC nurse will continue to follow weekly Please re-consult the WOC team if needed.  [name] [name], MSN, RN, CMSRN, Angus Seller, [facility] Wound Treatment Associate Pager [phone]

## 2020-11-25 NOTE — Progress Notes (Signed)
Pt placed on 50% trach collar.

## 2020-11-25 NOTE — Progress Notes (Signed)
[facility] Progress Note    Assessment/ Plan:   Pt is a 60 y.o. yo male with history of COVID-19, ARDS, mechanical ventilation, shock with dialysis dependent AKI.  #Acute kidney injury: severe requiring dialysis. ATN due to septic shock, COVID-19 infection.  Kidney ultrasound ruled out obstruction.  Required CRRT from 12/16-12/26.  Since then intermittent hemodialysis, last HD on 12/28 with no UF.   -Crt and BUN downtrending slowly - likely has increased insensible losses still, FWF has been increased and can use IVFs prn as well -Continue to hold dialysis for now -No HD access at this time  #ARDS due to COVID-19 pneumonia: Required mechanical ventilation.  Currently has tracheostomy on vent.  Per PCCM.  #Acute metabolic encephalopathy combination of medication, ICU delirium, sedatives.  Monitor mental status.  Overall imrpving.  #Severe physical deconditioning: PT OT, PEG 12/30  #Anemia due to critical illness: Transfusion as needed.  On Aranesp 100 mcg weekly  #Hypotension: Borderline BP.  off midodrine at this time  #Hypernatremia: improved   Subjective:    Seen in room.  Cr slowly continues to improve.    Objective:   BP (!) 135/95   Pulse (!) 134   Temp 99.1 F (37.3 C) (Oral)   Resp (!) 29   Ht 6' (1.829 m)   Wt 110.9 kg   SpO2 96%   BMI 33.16 kg/m   Intake/Output Summary (Last 24 hours) at 11/25/2020 1112 Last data filed at 11/25/2020 1000 Gross per 24 hour  Intake 4617.87 ml  Output 3425 ml  Net 1192.87 ml   Weight change:   Physical Exam: Gen: NAD, sitting in bed NECK:+ trach in place CVS: tachycardic Resp: tachypneic Abd: soft Ext: trace LE edema  Imaging: No results found.  Labs: BMET Recent Labs  Lab 11/21/20 1600 11/22/20 0601 11/22/20 1835 11/23/20 0148 11/23/20 1657 11/24/20 0342 11/25/20 0135  NA 142 145 143 146* 145 144 141  K 4.5 4.5 4.2 4.3 4.2 4.2 4.1  CL 109 111 110 114* 113* 111 109  CO2 20* 20*  20* 20* 20* 19* 18*  GLUCOSE 115* 105* 139* 150* 134* 132* 139*  BUN 110* 112* 115* 119* 119* 117* 113*  CREATININE 3.22* 3.40* 3.34* 3.38* 3.07* 2.86* 2.30*  CALCIUM 8.7* 8.8* 8.7* 9.1 9.2 9.1 9.1  PHOS 8.9* 8.6* 7.6* 7.3* 6.4* 6.3* 6.5*   CBC Recent Labs  Lab 11/20/20 0441 11/21/20 0436 11/22/20 0601 11/23/20 0148  WBC 13.7* 9.4 11.4* 12.5*  NEUTROABS 9.9* 6.5 9.3* 9.8*  HGB 8.1* 7.4* 7.5* 7.4*  HCT 24.7* 24.0* 24.4* 23.3*  MCV 100.0 102.1* 102.1* 101.3*  PLT 118* 121* 175 196    Medications:    . albuterol  2.5 mg Nebulization QID  . chlorhexidine gluconate (MEDLINE KIT)  15 mL Mouth Rinse BID  . Chlorhexidine Gluconate Cloth  6 each Topical Q0600  . darbepoetin (ARANESP) injection - DIALYSIS  100 mcg Intravenous Q Tue-HD  . famotidine  20 mg Per Tube QHS  . feeding supplement (PROSource TF)  90 mL Per Tube TID  . free water  400 mL Per Tube Q3H  . heparin injection (subcutaneous)  5,000 Units Subcutaneous Q8H  . linagliptin  5 mg Per Tube Daily  . mouth rinse  15 mL Mouth Rinse 10 times per day  . minocycline  100 mg Per Tube Q12H  . QUEtiapine  50 mg Oral QHS  . sodium chloride flush  10-40 mL Intracatheter Q12H  Madelon Lips, MD 11/25/2020, 11:12 AM

## 2020-11-25 NOTE — Progress Notes (Signed)
NAME:  Jackson Figueroa, MRN:  [id number], DOB:  22-Jan-1961, LOS: 59 ADMISSION DATE:  10/20/2020, CONSULTATION DATE:  11/29 REFERRING MD:  Sabra Heck, CHIEF COMPLAINT:  ARDS COVID 17   Brief History:  60 year old male with no significant past medical history and a positive COVID-19 test on 11/26 presented to [facility] with profound hypoxemia on 11/28. Intubated immediately upon arrival found to be in ARDS. Transferred to [facility] long hospital for ongoing ICU care  11/29.  Past Medical History:  Obesity   Significant Hospital Events:  11/28 Presented to [facility] ER, intubated for hypoxemia, empiric abx, shock on levo. COVID Rx 11/29 Persistent shock, central line placed, ARDS protocol continued, proning initiated 11/30 Pressor requirements continued, NMB initiated to facilitate ventilation tube feeds started, initiated proning 12/01 Stopping NMB, proning positioning protocol discontinued, free water for rising creatinine and sodium 12/02 PEEP trending down. Renal function improved. Changed to versed from prop gtt d/t rising triglycerides  12/03 Remdesivir completed. Peep/FIO2 stable. Free water adjusted. Solumedrol reduced, fentanyl to Dilaudid, adding Oxycodone 12/04 Dyssynchrony, given paralytic overnight, changed to ketamine; vomited / aspirated, restarted paralytics 12/06 Proned, under NMB 12/07 Proning, NMB continued. Switched antibiotics for stenotrophomonas 12/13 Stopped oxycodone, added dilaudid oral, continuous nimbex infusion to prn, palliative care consulted 12/14 Changed to pressure control 12/15 Worsening vent synchrony after clamping OG tube 12/18 added reglan IV for ? Ileus developing  12/18 back on paralytics for "guppy breathing/ air stacking" on PCV ? ,mucus plug  12/19  Back on PCV and off paralytics  12/20 having intermittent fevers, atrial fibrillation with some ST changes.  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 Started on minocycline per ID 12/22 stable decreased steroid dosing. Clonazepam added 12/23 trach placed. Versed off. Changed dilaudid from 4mg -.3mg , added clonidine and increased depakote. Added midodrine. OGT out during surg. Post-pyloric dislodged. Tip in stomach. Attempting trickle feeds w/ reglan  12/24 NG tube placed by IR 12/25 Dilaudid weaned to off, tolerated trach collar 12/26 tolerated weaning for about 4 hours on trach collar 12/30 G tube with IR  Consults:   Nephrology 12/14 Palliative 12/13 ID signed off 12/17  Procedures:  ETT 11/28 >>trach 12/23 Bates CVL 11/29 >> 12/9 R PICC 12/9 >>  R IJ HD Cath 12/17 >> G tube 12/30>>  Significant Diagnostic Tests:   ECHO 11/29 >>LVEF 50-55%, mild LVH, RV systolic function mildly reduced. Mild dilation of ascending aorta 36mm. LE Venous Duplex 12/8 >> negative for DVT   Micro Data:   COVID 11/28 >> Positive  Influenza A/B 11/28 >> negative  UC 11/28 >> negative  BCx2 11/28 >> negative  Tracheal aspirate 12/5 >> Stenotrophomonas maltophilia >> S-bactrim, levofloxacin UC 12/11 >> negative  Tracheal aspirate 12/11 >> Stenotrophomonas maltophilia >> S-bactrim, levofloxacin  BCx2 12/11 >> negative  Trach asp 12/ 19 >>> multidrug resistance stenotrophomonas Tracheal stoma 12/28> MDR moderate strep mitis/oralis, rare steno  Antimicrobials:   CTX 11/28 > 11/29 Azithromycin 11/28 > 11/29 Remdesivir 11/28 > 12/2 Prednisone 11/28 >  Baricitinib 12/4 > 12/5 Vanco 12/5 >> 12/7, 12/11 x1 Cefepime 12/5 >> 12/7, 12/11 >> 12/13 Levaquin 12/7 >> 12/16 Minocycline 12/21>12/28, 12/31>>   Rocephin 12/31>>  Interim History / Subjective:   No overnight issues. Low grade fever with tachycardia this morning. Denies pain.   Objective   Blood pressure (!) 135/91,  pulse 93, temperature  99.2 F (37.3 C), temperature source Axillary, resp. rate (!) 27, height 6' (1.829 m), weight 110.9 kg, SpO2 100 %.    Vent Mode: Stand-by FiO2 (%):  [40 %-60 %] 60 % Set Rate:  [15 bmp] 15 bmp PEEP:  [5 cmH20] 5 cmH20 Plateau Pressure:  [9 cmH20] 9 cmH20   Intake/Output Summary (Last 24 hours) at 11/25/2020 1749 Last data filed at 11/25/2020 1600 Gross per 24 hour  Intake 4100 ml  Output 2975 ml  Net 1125 ml   Filed Weights   11/19/20 0500 11/21/20 0447 11/22/20 0500  Weight: 108.3 kg 108.5 kg 110.9 kg    Examination:  General:  In bed on vent HENT: NCAT Tracheostomy in place PULM: Rhonchi bilaterally B, vent supported breathing CV: tachycardic, regular, no mgr GI: BS+, soft, nontender MSK: normal bulk and tone Neuro: awake, denies pain, follows commands   Resolved Hospital Problem list     Assessment & Plan:   Acute Metabolic encephalopathy Try to re-establish sleep wake cycle Continue seroquel qHS  ARDS due to COVID 19 Trach care per routine Move to trach collar again today When using full vent support, use pressure control ventilation. Rest on vent at night tonight.  MDR Stenoptrophomonas pneumonia Ceftriaxone and minocycline per ID  AKI in HD HD decision per renal Hoping for renal recovery. No HD access at this time.   Profound deconditioning PT following  Anemia of chronic illness Monitor for bleeding Transfuse PRBC for Hgb < 7 gm/dL  Tissue injury sacrum, toes Wound care   Best practice (evaluated daily)  Diet: tube feeding Pain/Anxiety/Delirium protocol (if indicated): no PAD, minimize sedation VAP protocol (if indicated): yes DVT prophylaxis: Sub q heparin GI prophylaxis: Pantoprazole for stress ulcer prophylaxis Glucose control: monitor Mobility: PT working with patient Disposition: LTACH  Goals of Care:  Last date of multidisciplinary goals of care discussion: 12/29 Family and staff present: sister,  Bowser Summary of discussion: full code, hoping for functional recovery Follow up goals of care discussion due: 1/5 Code Status: full  Labs   CBC: Recent Labs  Lab 11/19/20 0204 11/20/20 0441 11/21/20 0436 11/22/20 0601 11/23/20 0148  WBC 22.8* 13.7* 9.4 11.4* 12.5*  NEUTROABS 18.7* 9.9* 6.5 9.3* 9.8*  HGB 8.1* 8.1* 7.4* 7.5* 7.4*  HCT 25.6* 24.7* 24.0* 24.4* 23.3*  MCV 101.6* 100.0 102.1* 102.1* 101.3*  PLT 125* 118* 121* 175 [id number]    Basic Metabolic Panel: Recent Labs  Lab 11/19/20 0204 11/19/20 0328 11/20/20 0441 11/20/20 1734 11/21/20 0436 11/21/20 1600 11/22/20 0601 11/22/20 1835 11/23/20 0148 11/23/20 1657 11/24/20 0342 11/25/20 0135  NA 135   < > 136   < > 140   < > 145 143 146* 145 144 141  K 6.1*   < > 3.7   < > 3.9   < > 4.5 4.2 4.3 4.2 4.2 4.1  CL 101   < > 99   < > 104   < > 111 110 114* 113* 111 109  CO2 20*   < > 24   < > 22   < > 20* 20* 20* 20* 19* 18*  GLUCOSE 137*   < > 103*   < > 125*   < > 105* 139* 150* 134* 132* 139*  BUN 106*   < > 62*   < > 102*   < > 112* 115* 119* 119* 117* 113*  CREATININE 3.34*   < > 2.34*   < > 3.15*   < > 3.40*  3.34* 3.38* 3.07* 2.86* 2.30*  CALCIUM 9.3   < > 8.4*   < > 8.6*   < > 8.8* 8.7* 9.1 9.2 9.1 9.1  MG 3.1*  --  2.4  --  2.5*  --  2.7*  --  2.6*  --   --   --   PHOS 8.2*   < > 6.2*   < > 8.6*   < > 8.6* 7.6* 7.3* 6.4* 6.3* 6.5*   < > = values in this interval not displayed.   GFR: Estimated Creatinine Clearance: 45.6 mL/min (A) (by C-G formula based on SCr of 2.3 mg/dL (H)). Recent Labs  Lab 11/20/20 0441 11/21/20 0436 11/22/20 0601 11/23/20 0148  WBC 13.7* 9.4 11.4* 12.5*    Liver Function Tests: Recent Labs  Lab 11/22/20 1835 11/23/20 0148 11/23/20 1657 11/24/20 0342 11/25/20 0135  ALBUMIN 2.2* 2.2* 2.3* 2.3* 2.2*   No results for input(s): LIPASE, AMYLASE in the last 168 hours. No results for input(s): AMMONIA in the last 168 hours.  ABG    Component Value Date/Time   PHART 7.461 (H)  11/17/2020 1155   PCO2ART 35.7 11/17/2020 1155   PO2ART 70.8 (L) 11/17/2020 1155   HCO3 25.1 11/17/2020 1155   ACIDBASEDEF 1.3 11/10/2020 0625   O2SAT 93.8 11/17/2020 1155     Coagulation Profile: Recent Labs  Lab 11/21/20 0954  INR 1.1    Cardiac Enzymes: No results for input(s): CKTOTAL, CKMB, CKMBINDEX, TROPONINI in the last 168 hours.  HbA1C: Hgb A1c MFr Bld  Date/Time Value Ref Range Status  10/22/2020 03:38 AM 5.3 4.8 - 5.6 % Final    Comment:    (NOTE)         Prediabetes: 5.7 - 6.4         Diabetes: >6.4         Glycemic control for adults with diabetes: <7.0     CBG: Recent Labs  Lab 11/24/20 1941 11/24/20 2312 11/25/20 0825 11/25/20 1134 11/25/20 1548  GLUCAP 109* 114* 114* 119* 84     The patient is critically ill with multiple organ systems failure and requires high complexity decision making for assessment and support, frequent evaluation and titration of therapies, application of advanced monitoring technologies and extensive interpretation of multiple databases.   Critical Care Time devoted to patient care services described in this note is 36 minutes. This time reflects time of care of this signee Charlott Holler . This critical care time does not reflect separately billable procedures or procedure time, teaching time or supervisory time of PA/NP/Med student/Med Resident etc but could involve care discussion time.  Mickel Baas Pulmonary and Critical Care Medicine 11/25/2020 5:49 PM  Pager: [phone] After hours pager: [phone]

## 2020-11-26 ENCOUNTER — Encounter (HOSPITAL_COMMUNITY): Payer: Self-pay | Admitting: Specialist

## 2020-11-26 LAB — RENAL FUNCTION PANEL
Albumin: 2.3 g/dL — ABNORMAL LOW (ref 3.5–5.0)
Anion gap: 14 (ref 5–15)
BUN: 109 mg/dL — ABNORMAL HIGH (ref 6–20)
CO2: 17 mmol/L — ABNORMAL LOW (ref 22–32)
Calcium: 9 mg/dL (ref 8.9–10.3)
Chloride: 106 mmol/L (ref 98–111)
Creatinine, Ser: 1.99 mg/dL — ABNORMAL HIGH (ref 0.61–1.24)
GFR, Estimated: 38 mL/min — ABNORMAL LOW (ref >60)
Glucose, Bld: 139 mg/dL — ABNORMAL HIGH (ref 70–99)
Phosphorus: 6.3 mg/dL — ABNORMAL HIGH (ref 2.5–4.6)
Potassium: 4.4 mmol/L (ref 3.5–5.1)
Sodium: 137 mmol/L (ref 135–145)

## 2020-11-26 LAB — GLUCOSE, CAPILLARY
Glucose-Capillary: 103 mg/dL — ABNORMAL HIGH (ref 70–99)
Glucose-Capillary: 105 mg/dL — ABNORMAL HIGH (ref 70–99)
Glucose-Capillary: 107 mg/dL — ABNORMAL HIGH (ref 70–99)
Glucose-Capillary: 109 mg/dL — ABNORMAL HIGH (ref 70–99)
Glucose-Capillary: 117 mg/dL — ABNORMAL HIGH (ref 70–99)
Glucose-Capillary: 123 mg/dL — ABNORMAL HIGH (ref 70–99)

## 2020-11-26 MED ORDER — FREE WATER
200.0000 mL | Status: DC
  Administered 2020-11-26 – 2020-12-01 (×27): 200 mL

## 2020-11-26 MED ORDER — SODIUM BICARBONATE 650 MG PO TABS
650.0000 mg | ORAL_TABLET | Freq: Two times a day (BID) | ORAL | Status: DC
  Administered 2020-11-26 – 2020-11-29 (×7): 650 mg via ORAL
  Filled 2020-11-26 (×7): qty 1

## 2020-11-26 NOTE — Progress Notes (Signed)
RT NOTE: PT working with patient. RT will place patient on ATC when PT done. Vitals are stable. RT will continue to monitor.

## 2020-11-26 NOTE — Progress Notes (Signed)
[facility] Progress Note    Assessment/ Plan:   Pt is a 60 y.o. yo male with history of COVID-19, ARDS, mechanical ventilation, shock with dialysis dependent AKI.  #Acute kidney injury: severe and required dialysis. ATN due to septic shock, COVID-19 infection.  Kidney ultrasound ruled out obstruction.  Required CRRT from 12/16-12/26.  Since then intermittent hemodialysis, last HD on 12/28 with no UF.   -Crt and BUN downtrending slowly - likely has increased insensible losses still, FWF has been increased and can use IVFs prn as well - improving and do not forsee him needing more RRT if trajectory continues this way - HD cath has been removed  #ARDS due to COVID-19 pneumonia: Required mechanical ventilation.  Currently has tracheostomy and is weaning.  Per PCCM.  #Acute metabolic encephalopathy combination of medication, ICU delirium, sedatives.  Monitor mental status.  Overall improving.  #Severe physical deconditioning: PT OT, PEG 12/30  #Anemia due to critical illness: Transfusion as needed.  On Aranesp 100 mcg weekly  #Hypotension: Borderline BP.  off midodrine at this time  #Hypernatremia: improved  # metabolic acidosis: will add a little bicarb  #Dispo: will sign off.  Call with questions.     Subjective:    On TC today.  Cr and BUN better after increasing free water.  UOP good.    Objective:   BP 123/85   Pulse 95   Temp 98 F (36.7 C) (Oral)   Resp (!) 28   Ht 6' (1.829 m)   Wt 110.9 kg   SpO2 100%   BMI 33.16 kg/m   Intake/Output Summary (Last 24 hours) at 11/26/2020 1028 Last data filed at 11/26/2020 0800 Gross per 24 hour  Intake 1430 ml  Output 1580 ml  Net -150 ml   Weight change:   Physical Exam: Gen: NAD, sitting in bed NECK:+ trach in place, on TC CVS: RRR Resp: coarse bilateral breath sounds Abd: soft Ext: trace LE edema  Imaging: No results found.  Labs: BMET Recent Labs  Lab 11/22/20 0601 11/22/20 1835  11/23/20 0148 11/23/20 1657 11/24/20 0342 11/25/20 0135 11/26/20 0147  NA 145 143 146* 145 144 141 137  K 4.5 4.2 4.3 4.2 4.2 4.1 4.4  CL 111 110 114* 113* 111 109 106  CO2 20* 20* 20* 20* 19* 18* 17*  GLUCOSE 105* 139* 150* 134* 132* 139* 139*  BUN 112* 115* 119* 119* 117* 113* 109*  CREATININE 3.40* 3.34* 3.38* 3.07* 2.86* 2.30* 1.99*  CALCIUM 8.8* 8.7* 9.1 9.2 9.1 9.1 9.0  PHOS 8.6* 7.6* 7.3* 6.4* 6.3* 6.5* 6.3*   CBC Recent Labs  Lab 11/20/20 0441 11/21/20 0436 11/22/20 0601 11/23/20 0148  WBC 13.7* 9.4 11.4* 12.5*  NEUTROABS 9.9* 6.5 9.3* 9.8*  HGB 8.1* 7.4* 7.5* 7.4*  HCT 24.7* 24.0* 24.4* 23.3*  MCV 100.0 102.1* 102.1* 101.3*  PLT 118* 121* 175 196    Medications:    . albuterol  2.5 mg Nebulization QID  . chlorhexidine gluconate (MEDLINE KIT)  15 mL Mouth Rinse BID  . Chlorhexidine Gluconate Cloth  6 each Topical Q0600  . darbepoetin (ARANESP) injection - DIALYSIS  100 mcg Subcutaneous Q Tue-1800  . famotidine  20 mg Per Tube QHS  . feeding supplement (PROSource TF)  90 mL Per Tube TID  . free water  400 mL Per Tube Q3H  . heparin injection (subcutaneous)  5,000 Units Subcutaneous Q8H  . linagliptin  5 mg Per Tube Daily  . mouth rinse    15 mL Mouth Rinse 10 times per day  . minocycline  100 mg Per Tube Q12H  . QUEtiapine  50 mg Oral QHS  . sodium chloride flush  10-40 mL Intracatheter Q12H       , MD 11/26/2020, 10:28 AM  

## 2020-11-26 NOTE — Progress Notes (Addendum)
NAME:  Jackson Figueroa, MRN:  [id number], DOB:  October 06, 1961, LOS: 37 ADMISSION DATE:  10/20/2020, CONSULTATION DATE:  11/29 REFERRING MD:  Hyacinth Meeker, CHIEF COMPLAINT:  ARDS COVID 12   Brief History:  60 year old male with no significant past medical history and a positive COVID-19 test on 11/26 presented to [facility] with profound hypoxemia on 11/28. Intubated immediately upon arrival found to be in ARDS. Transferred to [facility] long hospital for ongoing ICU care  11/29.  Past Medical History:  Obesity   Significant Hospital Events:  11/28 Presented to [facility] ER, intubated for hypoxemia, empiric abx, shock on levo. COVID Rx 11/29 Persistent shock, central line placed, ARDS protocol continued, proning initiated 11/30 Pressor requirements continued, NMB initiated to facilitate ventilation tube feeds started, initiated proning 12/01 Stopping NMB, proning positioning protocol discontinued, free water for rising creatinine and sodium 12/02 PEEP trending down. Renal function improved. Changed to versed from prop gtt d/t rising triglycerides  12/03 Remdesivir completed. Peep/FIO2 stable. Free water adjusted. Solumedrol reduced, fentanyl to Dilaudid, adding Oxycodone 12/04 Dyssynchrony, given paralytic overnight, changed to ketamine; vomited / aspirated, restarted paralytics 12/06 Proned, under NMB 12/07 Proning, NMB continued. Switched antibiotics for stenotrophomonas 12/13 Stopped oxycodone, added dilaudid oral, continuous nimbex infusion to prn, palliative care consulted 12/14 Changed to pressure control 12/15 Worsening vent synchrony after clamping OG tube 12/18 added reglan IV for ? Ileus developing  12/18 back on paralytics for "guppy breathing/ air stacking" on PCV ? ,mucus plug  12/19  Back on PCV and off paralytics  12/20 having intermittent fevers, atrial fibrillation with some ST changes.  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 Started on minocycline per ID 12/22 stable decreased steroid dosing. Clonazepam added 12/23 trach placed. Versed off. Changed dilaudid from 4mg -.3mg , added clonidine and increased depakote. Added midodrine. OGT out during surg. Post-pyloric dislodged. Tip in stomach. Attempting trickle feeds w/ reglan  12/24 NG tube placed by IR 12/25 Dilaudid weaned to off, tolerated trach collar 12/26 tolerated weaning for about 4 hours on trach collar 12/30 G tube with IR  Consults:   Nephrology 12/14 Palliative 12/13 ID signed off 12/17  Procedures:  ETT 11/28 >>trach 12/23 Bates CVL 11/29 >> 12/9 R PICC 12/9 >>  R IJ HD Cath 12/17 >> G tube 12/30>>  Significant Diagnostic Tests:   ECHO 11/29 >>LVEF 50-55%, mild LVH, RV systolic function mildly reduced. Mild dilation of ascending aorta 58mm. LE Venous Duplex 12/8 >> negative for DVT   Micro Data:   COVID 11/28 >> Positive  Influenza A/B 11/28 >> negative  UC 11/28 >> negative  BCx2 11/28 >> negative  Tracheal aspirate 12/5 >> Stenotrophomonas maltophilia >> S-bactrim, levofloxacin UC 12/11 >> negative  Tracheal aspirate 12/11 >> Stenotrophomonas maltophilia >> S-bactrim, levofloxacin  BCx2 12/11 >> negative  Trach asp 12/ 19 >>> multidrug resistance stenotrophomonas Tracheal stoma 12/28> MDR moderate strep mitis/oralis, rare steno  Antimicrobials:   CTX 11/28 > 11/29 Azithromycin 11/28 > 11/29 Remdesivir 11/28 > 12/2 Prednisone 11/28 >  Baricitinib 12/4 > 12/5 Vanco 12/5 >> 12/7, 12/11 x1 Cefepime 12/5 >> 12/7, 12/11 >> 12/13 Levaquin 12/7 >> 12/16 Minocycline 12/21>12/28, 12/31>>   Rocephin 12/31>>  Interim History / Subjective:  Awake and alert Tolerating ATC 40% Tolerated tilting per PT without significant change in HR Copious secretions, strong  cough Thin and clear secretions Creatinine 1.99>>  Down Trending Last HD was 12/28 with No UF  Net + 9374 Gap 14 WBC 12.5, T max 99.5 K is 4.2 HGB 7.4   Objective   Blood pressure 135/84, pulse (!) 103, temperature 98 F (36.7 C), temperature source Oral, resp. rate (!) 35, height 6' (1.829 m), weight 110.9 kg, SpO2 100 %.    Vent Mode: PCV FiO2 (%):  [40 %-60 %] 40 % Set Rate:  [15 bmp] 15 bmp PEEP:  [5 cmH20] 5 cmH20   Intake/Output Summary (Last 24 hours) at 11/26/2020 1038 Last data filed at 11/26/2020 0800 Gross per 24 hour  Intake 1430 ml  Output 1580 ml  Net -150 ml   Filed Weights   11/19/20 0500 11/21/20 0447 11/22/20 0500  Weight: 108.3 kg 108.5 kg 110.9 kg    Examination:  General:  Supine in bed on ATC 40%, in NAD HENT: NCAT Tracheostomy in place and secure, MM pink and moist PULM:Bilateral chest excursion,  Rhonchi bilaterally B, copious thin white to clear secretions,  CV: S1, S2, RRR, tachycardic,  no mgr GI: BS+, soft, non tender, tolerating TF MSK: normal bulk and tone, weak and deconditioned after long critical illness Neuro: awake, denies pain, follows commands   Resolved Hospital Problem list     Assessment & Plan:   Acute Metabolic encephalopathy Try to re-establish sleep wake cycle Continue seroquel qHS  ARDS due to COVID 19 Trach care per routine Continue ATS trials When using full vent support, use pressure control ventilation. Rest on vent at night tonight.  MDR Stenoptrophomonas pneumonia Ceftriaxone and minocycline per ID  AKI in HD Last iHD 12/28 Renal function slowly improving HD decision per renal Hoping for renal recovery.    Profound deconditioning PT following  Anemia of chronic illness Monitor for bleeding Transfuse PRBC for Hgb < 7 gm/dL Monitor platelets as did drop to 90K on 12/26  Tissue injury sacrum, toes Wound care  Will need LTAC placement . Dr. Shearon Stalls to discuss with Social Work at 11 am rounds  11/26/2020. Best practice (evaluated daily)  Diet: tube feeding Pain/Anxiety/Delirium protocol (if indicated): no PAD, minimize sedation VAP protocol (if indicated): yes DVT prophylaxis: Sub q heparin GI prophylaxis: Pantoprazole for stress ulcer prophylaxis Glucose control: monitor Mobility: PT working with patient Disposition: LTAC  I have updated patient's sister in full by phone on 11/26/2020. She verbalized understanding of the update. And had no further questions at completion of the call.    Goals of Care:  Last date of multidisciplinary goals of care discussion: 12/29 Family and staff present: sister, Bowser Summary of discussion: full code, hoping for functional recovery Follow up goals of care discussion due: 1/5 Code Status: full  Labs   CBC: Recent Labs  Lab 11/20/20 0441 11/21/20 0436 11/22/20 0601 11/23/20 0148  WBC 13.7* 9.4 11.4* 12.5*  NEUTROABS 9.9* 6.5 9.3* 9.8*  HGB 8.1* 7.4* 7.5* 7.4*  HCT 24.7* 24.0* 24.4* 23.3*  MCV 100.0 102.1* 102.1* 101.3*  PLT 118* 121* 175 [id number]    Basic Metabolic Panel: Recent Labs  Lab 11/20/20 0441 11/20/20 1734 11/21/20 0436 11/21/20 1600 11/22/20 0601 11/22/20 1835 11/23/20 0148 11/23/20 1657 11/24/20 0342 11/25/20 0135 11/26/20 0147  NA 136   < > 140   < > 145   < > 146* 145 144 141 137  K 3.7   < > 3.9   < > 4.5   < > 4.3 4.2 4.2 4.1 4.4  CL 99   < > 104   < >  111   < > 114* 113* 111 109 106  CO2 24   < > 22   < > 20*   < > 20* 20* 19* 18* 17*  GLUCOSE 103*   < > 125*   < > 105*   < > 150* 134* 132* 139* 139*  BUN 62*   < > 102*   < > 112*   < > 119* 119* 117* 113* 109*  CREATININE 2.34*   < > 3.15*   < > 3.40*   < > 3.38* 3.07* 2.86* 2.30* 1.99*  CALCIUM 8.4*   < > 8.6*   < > 8.8*   < > 9.1 9.2 9.1 9.1 9.0  MG 2.4  --  2.5*  --  2.7*  --  2.6*  --   --   --   --   PHOS 6.2*   < > 8.6*   < > 8.6*   < > 7.3* 6.4* 6.3* 6.5* 6.3*   < > = values in this interval not displayed.   GFR: Estimated Creatinine  Clearance: 52.7 mL/min (A) (by C-G formula based on SCr of 1.99 mg/dL (H)). Recent Labs  Lab 11/20/20 0441 11/21/20 0436 11/22/20 0601 11/23/20 0148  WBC 13.7* 9.4 11.4* 12.5*    Liver Function Tests: Recent Labs  Lab 11/23/20 0148 11/23/20 1657 11/24/20 0342 11/25/20 0135 11/26/20 0147  ALBUMIN 2.2* 2.3* 2.3* 2.2* 2.3*   No results for input(s): LIPASE, AMYLASE in the last 168 hours. No results for input(s): AMMONIA in the last 168 hours.  ABG    Component Value Date/Time   PHART 7.461 (H) 11/17/2020 1155   PCO2ART 35.7 11/17/2020 1155   PO2ART 70.8 (L) 11/17/2020 1155   HCO3 25.1 11/17/2020 1155   ACIDBASEDEF 1.3 11/10/2020 0625   O2SAT 93.8 11/17/2020 1155     Coagulation Profile: Recent Labs  Lab 11/21/20 0954  INR 1.1    Cardiac Enzymes: No results for input(s): CKTOTAL, CKMB, CKMBINDEX, TROPONINI in the last 168 hours.  HbA1C: Hgb A1c MFr Bld  Date/Time Value Ref Range Status  10/22/2020 03:38 AM 5.3 4.8 - 5.6 % Final    Comment:    (NOTE)         Prediabetes: 5.7 - 6.4         Diabetes: >6.4         Glycemic control for adults with diabetes: <7.0     CBG: Recent Labs  Lab 11/25/20 1548 11/25/20 1945 11/25/20 2335 11/26/20 0330 11/26/20 0752  GLUCAP 84 104* 131* 109* 117*     The patient is critically ill with multiple organ systems failure and requires high complexity decision making for assessment and support, frequent evaluation and titration of therapies, application of advanced monitoring technologies and extensive interpretation of multiple databases.   Critical Care Time devoted to patient care services described in this note is 32 minutes. This time reflects time of care of this [place]ley NP . This critical care time does not reflect separately billable procedures or procedure time, but  could involve care discussion time.   Magdalen Spatz, MSN, AGACNP-BC [place] for personal  pager 11/26/2020 10:38 AM

## 2020-11-26 NOTE — Progress Notes (Signed)
Physical Therapy Treatment Patient Details Name: Jackson Figueroa MRN: [id number] DOB: 04/12/1961 Today's Date: 11/26/2020    History of Present Illness 60 yo admitted 11/28 with respiratory distress and unresponsive at home with Covid 19. Pt with AKI and HD dependent. CRRT 12/16-12/26. Trach 12/23. encephalopathy since admission. PEG 12/30.PMHx: obesity    PT Comments    Pt with increased tolerance for tilt to 45 degrees with max HR 128. Pt with increased interaction, facial expression and attempting to mouth words. Pt with noted activation of all extremities this session from trace to 2/5 strength and able to perform bil UE exercises in supine and standing. Pt even performed mini squats at 30 degree tilt. Pt continues to have difficulty with neck and trunk control with cues and assist to control head grossly every 3-5 sec and maintained left lean. Will continue to work toward activity progression.   123/85 (97) supine 107/79 (85) 30 degrees 108/76 (84) 35 degrees 112/75 (85) 45 degrees 129/91 (102) end of session in chair position  HR 96-128 SpO2 100% on 40% PC vent     Follow Up Recommendations  LTACH;Supervision/Assistance - 24 hour     Equipment Recommendations  Hospital bed;Wheelchair (measurements PT);Wheelchair cushion (measurements PT)    Recommendations for Other Services       Precautions / Restrictions Precautions Precautions: Fall Precaution Comments: PEG, trach, vent, flexiseal    Mobility  Bed Mobility Overal bed mobility: Needs Assistance Bed Mobility: Rolling Rolling: Total assist;+2 for physical assistance         General bed mobility comments: total assist with use of bed to transition from supine to sitting  Transfers     Transfers: Sit to/from Stand Sit to Stand: Total assist         General transfer comment: Kreg bed to tilt from supine to standing at 45 degrees  Ambulation/Gait             General Gait Details: unable   Stairs              Wheelchair Mobility    Modified Rankin (Stroke Patients Only)       Balance Overall balance assessment: Needs assistance   Sitting balance-Leahy Scale: Zero Sitting balance - Comments: trunk supported on surface in sitting with tendency for left lean   Standing balance support: No upper extremity supported;During functional activity Standing balance-Leahy Scale: Zero Standing balance comment: pt with strapping of trunk and legs in standing                            Cognition Arousal/Alertness: Awake/alert Behavior During Therapy: Flat affect Overall Cognitive Status: Difficult to assess Area of Impairment: Attention;Following commands                   Current Attention Level: Sustained   Following Commands: Follows one step commands inconsistently       General Comments: pt lifing head on command and moving all extremities at times with delay. Pt with facial expressions to indicate reception to joking and education. Pt able to appropriately select location out of choices with yes/no. Attempting at times to mouth statements but unintelligible      Exercises General Exercises - Upper Extremity Shoulder Flexion: AAROM;Both;Seated;10 reps Elbow Flexion: AAROM;Both;Standing;10 reps Elbow Extension: AAROM;Both;Standing;10 reps Digit Composite Flexion: AROM;Both;Supine;5 reps General Exercises - Lower Extremity Ankle Circles/Pumps: AAROM;Both;10 reps;Supine Heel Slides: AAROM;Both;10 reps;Supine Hip ABduction/ADduction: AAROM;Both;Supine;10 reps Mini-Sqauts: AAROM;Both;10 reps;Standing  General Comments        Pertinent Vitals/Pain Pain Assessment: No/denies pain    Home Living                      Prior Function            PT Goals (current goals can now be found in the care plan section) Progress towards PT goals: Progressing toward goals    Frequency    Min 3X/week      PT Plan Current plan remains  appropriate    Co-evaluation              AM-PAC PT "6 Clicks" Mobility   Outcome Measure  Help needed turning from your back to your side while in a flat bed without using bedrails?: Total Help needed moving from lying on your back to sitting on the side of a flat bed without using bedrails?: Total Help needed moving to and from a bed to a chair (including a wheelchair)?: Total Help needed standing up from a chair using your arms (e.g., wheelchair or bedside chair)?: Total Help needed to walk in hospital room?: Total Help needed climbing 3-5 steps with a railing? : Total 6 Click Score: 6    End of Session   Activity Tolerance: Patient tolerated treatment well Patient left: in bed;with call bell/phone within reach;with nursing/sitter in room Nurse Communication: Mobility status;Need for lift equipment PT Visit Diagnosis: Other abnormalities of gait and mobility (R26.89);Difficulty in walking, not elsewhere classified (R26.2);Other symptoms and signs involving the nervous system (R29.898);Muscle weakness (generalized) (M62.81)     Time: [id number] PT Time Calculation (min) (ACUTE ONLY): 46 min  Charges:  $Therapeutic Exercise: 23-37 mins $Therapeutic Activity: 8-22 mins                     Macdonald Rigor P, PT Acute Rehabilitation Services Pager: [phone] Office: [phone]    Dhaval Woo B Filmore Molyneux 11/26/2020, 12:59 PM

## 2020-11-26 NOTE — TOC Progression Note (Signed)
Transition of Care [facility]) - Progression Note    Patient Details  Name: Billey Wojciak MRN: [id number] Date of Birth: [birth year]  Transition of [facility]) CM/SW Contact  Glennon Mac, RN Phone Number: 11/26/2020, 3:32 PM  Clinical Narrative:  Noted TOC c/s for LTAC.  Unfortunately, pt is uninsured, and must have payor for LTAC admission.  Have left message with [employer] financial counseling to evaluate patient for Medicaid, as pt will ultimately need SNF placement.       Expected Discharge Plan: Skilled Nursing Facility Barriers to Discharge: Continued Medical Work up  Expected Discharge Plan and Services Expected Discharge Plan: Skilled Nursing Facility   Discharge Planning Services: CM Consult   Living arrangements for the past 2 months: Single Family Home                                       Social Determinants of Health (SDOH) Interventions    Readmission Risk Interventions No flowsheet data found.  Quintella Baton, RN, BSN  Trauma/Neuro ICU Case Manager [phone]

## 2020-11-27 ENCOUNTER — Inpatient Hospital Stay (HOSPITAL_COMMUNITY): Payer: BC Managed Care – PPO

## 2020-11-27 LAB — CBC
HCT: 24.3 % — ABNORMAL LOW (ref 39.0–52.0)
Hemoglobin: 7.9 g/dL — ABNORMAL LOW (ref 13.0–17.0)
MCH: 33.1 pg (ref 26.0–34.0)
MCHC: 32.5 g/dL (ref 30.0–36.0)
MCV: 101.7 fL — ABNORMAL HIGH (ref 80.0–100.0)
Platelets: 164 10*3/uL (ref 150–400)
RBC: 2.39 MIL/uL — ABNORMAL LOW (ref 4.22–5.81)
RDW: 15.6 % — ABNORMAL HIGH (ref 11.5–15.5)
WBC: 7.2 10*3/uL (ref 4.0–10.5)
nRBC: 0 % (ref 0.0–0.2)

## 2020-11-27 LAB — GLUCOSE, CAPILLARY
Glucose-Capillary: 111 mg/dL — ABNORMAL HIGH (ref 70–99)
Glucose-Capillary: 120 mg/dL — ABNORMAL HIGH (ref 70–99)
Glucose-Capillary: 120 mg/dL — ABNORMAL HIGH (ref 70–99)
Glucose-Capillary: 122 mg/dL — ABNORMAL HIGH (ref 70–99)
Glucose-Capillary: 124 mg/dL — ABNORMAL HIGH (ref 70–99)
Glucose-Capillary: 126 mg/dL — ABNORMAL HIGH (ref 70–99)

## 2020-11-27 LAB — COMPREHENSIVE METABOLIC PANEL
ALT: 29 U/L (ref 0–44)
AST: 26 U/L (ref 15–41)
Albumin: 2.3 g/dL — ABNORMAL LOW (ref 3.5–5.0)
Alkaline Phosphatase: 76 U/L (ref 38–126)
Anion gap: 14 (ref 5–15)
BUN: 100 mg/dL — ABNORMAL HIGH (ref 6–20)
CO2: 18 mmol/L — ABNORMAL LOW (ref 22–32)
Calcium: 9.2 mg/dL (ref 8.9–10.3)
Chloride: 108 mmol/L (ref 98–111)
Creatinine, Ser: 1.78 mg/dL — ABNORMAL HIGH (ref 0.61–1.24)
GFR, Estimated: 44 mL/min — ABNORMAL LOW (ref >60)
Glucose, Bld: 140 mg/dL — ABNORMAL HIGH (ref 70–99)
Potassium: 4.4 mmol/L (ref 3.5–5.1)
Sodium: 140 mmol/L (ref 135–145)
Total Bilirubin: 0.6 mg/dL (ref 0.3–1.2)
Total Protein: 5.9 g/dL — ABNORMAL LOW (ref 6.5–8.1)

## 2020-11-27 LAB — MAGNESIUM: Magnesium: 2 mg/dL (ref 1.7–2.4)

## 2020-11-27 LAB — PHOSPHORUS: Phosphorus: 7.2 mg/dL — ABNORMAL HIGH (ref 2.5–4.6)

## 2020-11-27 NOTE — Progress Notes (Signed)
Occupational Therapy Treatment Patient Details Name: Jackson Figueroa MRN: [id number] DOB: 12-31-1960 Today's Date: 11/27/2020    History of present illness 60 yo admitted 11/28 with respiratory distress and unresponsive at home with Covid 19. Pt with AKI and HD dependent. CRRT 12/16-12/26. Trach 12/23. encephalopathy since admission. PEG 12/30.PMHx: obesity   OT comments  Pt progressing. Pt appears to eye roll and try to smile as feedback. Pt following ~75% of commands, but fatigues easily. Pt attempting to mouth words, but unintelligible. Tilt to standing to 33* x2 times; unable to keep RLE from hyperextending after repositioning ~ 1 min each at 20*, 25* and 33*. Pt tolerating standing wtih KREG bed; BP, WFL 130/67. Pt would benefit from continued OT skilled services for ADL, mobility and safety. OT following.    Follow Up Recommendations  LTACH;SNF;Supervision/Assistance - 24 hour    Equipment Recommendations  Other (comment) (continue to assess)    Recommendations for Other Services      Precautions / Restrictions Precautions Precautions: Fall Precaution Comments: PEG, trach, vent, flexiseal Restrictions Weight Bearing Restrictions: No       Mobility Bed Mobility Overal bed mobility: Needs Assistance             General bed mobility comments: totalA for scooting towards [facility]  Transfers                 General transfer comment: Tilt to standing to 33* x2 times; unable to keep RLE from hyperextending after repositioning    Balance Overall balance assessment: Needs assistance           Standing balance-Leahy Scale: Zero Standing balance comment: pt with strapping of trunk and legs in standing at 33*                           ADL either performed or assessed with clinical judgement   ADL Overall ADL's : Needs assistance/impaired                                     Functional mobility during ADLs: Total assistance;+2 for physical  assistance;+2 for safety/equipment General ADL Comments: Total A for ADLs and bed mobility     Vision   Vision Assessment?: No apparent visual deficits Additional Comments: Continue to assess; pt appearsto scan room well   Perception     Praxis      Cognition Arousal/Alertness: Awake/alert Behavior During Therapy: Flat affect Overall Cognitive Status: Difficult to assess Area of Impairment: Following commands;Attention                   Current Attention Level: Sustained   Following Commands: Follows one step commands with increased time       General Comments: Pt appears to eye roll and try to smile as feedback. Pt following ~75% of commands, but fatigues easily. Pt attempting to mouth words, but unintelligible.        Exercises Exercises: Other exercises;General Upper Extremity General Exercises - Upper Extremity Elbow Flexion: Both;10 reps;AAROM;Supine Elbow Extension: AAROM;Both;Standing;10 reps Wrist Flexion: AAROM;Both;10 reps;Supine Wrist Extension: AAROM;Both;10 reps;Supine Digit Composite Flexion: AAROM;Both;10 reps;Supine Composite Extension: AAROM;Both;Supine Other Exercises Other Exercises: Kreg tilt bed 33* for ~3 mins at a time; was not able to position RLE well enough for therapy.   Shoulder Instructions       General Comments Pt tolerating standing wtih KREG bed; BP, WFL  130/67    Pertinent Vitals/ Pain       Pain Assessment: Faces Faces Pain Scale: Hurts little more Pain Location: RLE in "standing" tilted >20* Pain Descriptors / Indicators: Discomfort Pain Intervention(s): Monitored during session;Repositioned  Home Living                                          Prior Functioning/Environment              Frequency  Min 2X/week        Progress Toward Goals  OT Goals(current goals can now be found in the care plan section)  Progress towards OT goals: Progressing toward goals  Acute Rehab OT  Goals Patient Stated Goal: Go home OT Goal Formulation: With patient/family Time For Goal Achievement: 12/06/20 Potential to Achieve Goals: Good ADL Goals Pt Will Perform Grooming: with mod assist;sitting;bed level Pt Will Perform Upper Body Bathing: with mod assist;sitting;bed level Pt/caregiver will Perform Home Exercise Program: Increased strength;Both right and left upper extremity;With minimal assist;With written HEP provided Additional ADL Goal #1: Pt will tolerate 20 minutes in semi-standing position with tilt bed at ~55* Additional ADL Goal #2: Pt will perform bed mobility with Mod A +2 in preapration for ADLs  Plan Discharge plan remains appropriate    Co-evaluation                 AM-PAC OT "6 Clicks" Daily Activity     Outcome Measure   Help from another person eating meals?: Total Help from another person taking care of personal grooming?: Total Help from another person toileting, which includes using toliet, bedpan, or urinal?: Total Help from another person bathing (including washing, rinsing, drying)?: Total Help from another person to put on and taking off regular upper body clothing?: Total Help from another person to put on and taking off regular lower body clothing?: Total 6 Click Score: 6    End of Session Equipment Utilized During Treatment: Oxygen  OT Visit Diagnosis: Other abnormalities of gait and mobility (R26.89);Unsteadiness on feet (R26.81);Muscle weakness (generalized) (M62.81)   Activity Tolerance Patient tolerated treatment well;Patient limited by pain   Patient Left in bed;with call bell/phone within reach   Nurse Communication Mobility status        Time: [id number] OT Time Calculation (min): 56 min  Charges: OT General Charges $OT Visit: 1 Visit OT Treatments $Self Care/Home Management : 8-22 mins $Therapeutic Activity: 23-37 mins $Therapeutic Exercise: 8-22 mins  Jefferey Pica, OTR/L Acute Rehabilitation Services Pager:  [phone] Office: [phone]    Grizel Vesely C 11/27/2020, 5:47 PM

## 2020-11-27 NOTE — Progress Notes (Signed)
NAME:  Jackson Figueroa, MRN:  [id number], DOB:  [birth year], LOS: 38 ADMISSION DATE:  10/20/2020, CONSULTATION DATE:  11/29 REFERRING MD:  Hyacinth Meeker, CHIEF COMPLAINT:  ARDS COVID 16   Brief History:  60 year old male with no significant past medical history and a positive COVID-19 test on 11/26 presented to [facility] with profound hypoxemia on 11/28. Intubated immediately upon arrival found to be in ARDS. Transferred to [facility] for ongoing ICU care  11/29.  Past Medical History:  Obesity   Significant Hospital Events:  11/28 Presented to [facility] ER, intubated for hypoxemia, empiric abx, shock on levo. COVID Rx 11/29 Persistent shock, central line placed, ARDS protocol continued, proning initiated 11/30 Pressor requirements continued, NMB initiated to facilitate ventilation tube feeds started, initiated proning 12/01 Stopping NMB, proning positioning protocol discontinued, free water for rising creatinine and sodium 12/02 PEEP trending down. Renal function improved. Changed to versed from prop gtt d/t rising triglycerides  12/03 Remdesivir completed. Peep/FIO2 stable. Free water adjusted. Solumedrol reduced, fentanyl to Dilaudid, adding Oxycodone 12/04 Dyssynchrony, given paralytic overnight, changed to ketamine; vomited / aspirated, restarted paralytics 12/06 Proned, under NMB 12/07 Proning, NMB continued. Switched antibiotics for stenotrophomonas 12/13 Stopped oxycodone, added dilaudid oral, continuous nimbex infusion to prn, palliative care consulted 12/14 Changed to pressure control 12/15 Worsening vent synchrony after clamping OG tube 12/18 added reglan IV for ? Ileus developing  12/18 back on paralytics for "guppy breathing/ air stacking" on PCV ? ,mucus plug  12/19  Back on PCV and off paralytics  12/20 having intermittent fevers, atrial fibrillation with some ST changes.  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 Started on minocycline per ID 12/22 stable decreased steroid dosing. Clonazepam added 12/23 trach placed. Versed off. Changed dilaudid from 4mg -.3mg , added clonidine and increased depakote. Added midodrine. OGT out during surg. Post-pyloric dislodged. Tip in stomach. Attempting trickle feeds w/ reglan  12/24 NG tube placed by IR 12/25 Dilaudid weaned to off, tolerated trach collar 12/26 tolerated weaning for about 4 hours on trach collar 12/30 G tube with IR  Consults:   Nephrology 12/14 Palliative 12/13 ID signed off 12/17  Procedures:  ETT 11/28 >>trach 12/23 Bates CVL 11/29 >> 12/9 R PICC 12/9 >>  R IJ HD Cath 12/17 >> G tube 12/30>>  Significant Diagnostic Tests:   ECHO 11/29 >>LVEF 50-55%, mild LVH, RV systolic function mildly reduced. Mild dilation of ascending aorta 36mm. LE Venous Duplex 12/8 >> negative for DVT   Micro Data:   COVID 11/28 >> Positive  Influenza A/B 11/28 >> negative  UC 11/28 >> negative  BCx2 11/28 >> negative  Tracheal aspirate 12/5 >> Stenotrophomonas maltophilia >> S-bactrim, levofloxacin UC 12/11 >> negative  Tracheal aspirate 12/11 >> Stenotrophomonas maltophilia >> S-bactrim, levofloxacin  BCx2 12/11 >> negative  Trach asp 12/ 19 >>> multidrug resistance stenotrophomonas Tracheal stoma 12/28> MDR moderate strep mitis/oralis, rare steno  Antimicrobials:   CTX 11/28 > 11/29 Azithromycin 11/28 > 11/29 Remdesivir 11/28 > 12/2 Prednisone 11/28 >  Baricitinib 12/4 > 12/5 Vanco 12/5 >> 12/7, 12/11 x1 Cefepime 12/5 >> 12/7, 12/11 >> 12/13 Levaquin 12/7 >> 12/16 Minocycline 12/21>12/28, 12/31>>   Rocephin 12/31>>  Interim History / Subjective:  Awake, alert, on TC this morning 40% and working with OT.    Objective   Blood pressure 127/85, pulse (!) 104, temperature 99.5  F (37.5 C), temperature source Oral, resp. rate (!) 24, height 6' (1.829 m), weight 110.9 kg, SpO2 100 %.    Vent Mode: PCV FiO2 (%):  [40 %] 40 % Set Rate:  [15 bmp] 15 bmp PEEP:  [5 cmH20] 5 cmH20   Intake/Output Summary (Last 24 hours) at 11/27/2020 1423 Last data filed at 11/27/2020 1300 Gross per 24 hour  Intake 2005 ml  Output 2750 ml  Net -745 ml   Filed Weights   11/19/20 0500 11/21/20 0447 11/22/20 0500  Weight: 108.3 kg 108.5 kg 110.9 kg    Examination:  General:  Supine in bed on ATC 40%, in NAD HENT: NCAT Tracheostomy in place and secure, MM pink and moist PULM:Bilateral chest excursion,  Rhonchi bilaterally B, copious thin white to clear secretions,  CV: S1, S2, RRR, tachycardic,  no mgr GI: BS+, soft, non tender, tolerating TF MSK: normal bulk and tone, weak and deconditioned after long critical illness Neuro: awake, denies pain, follows commands   Resolved Hospital Problem list     Assessment & Plan:  ARDS due to COVID 26 Trach care per routine Continue TC trials. Did 16 hours yesterday. Would like to do 20 hours tonight.  When using full vent support, use pressure control ventilation. Rest on vent at night tonight.  Acute Metabolic encephalopathy Try to re-establish sleep wake cycle Continue seroquel qHS  MDR Stenoptrophomonas pneumonia Ceftriaxone and minocycline per ID  AKI in HD Last iHD 12/28 Renal function slowly improving HD decision per renal. No urgent needs. Appears to be recovering.   Profound deconditioning PT/OT following  Anemia of chronic illness Monitor for bleeding Transfuse PRBC for Hgb < 7 gm/dL  Tissue injury sacrum, toes Wound care   Best practice (evaluated daily)  Diet: tube feeding Pain/Anxiety/Delirium protocol (if indicated): no PAD, minimize sedation VAP protocol (if indicated): yes DVT prophylaxis: Sub q heparin GI prophylaxis: Pantoprazole for stress ulcer prophylaxis Glucose control: monitor Mobility: PT  working with patient Disposition: SNF. Uninsured and not eligible for LTACH   Goals of Care:  Last date of multidisciplinary goals of care discussion: 12/29 Family and staff present: sister, Bowser Summary of discussion: full code, hoping for functional recovery. patinet's family updated again 1/4 over the phone, Follow up goals of care discussion due: 1/5 Code Status: full  Labs   CBC: Recent Labs  Lab 11/21/20 0436 11/22/20 0601 11/23/20 0148 11/27/20 0138  WBC 9.4 11.4* 12.5* 7.2  NEUTROABS 6.5 9.3* 9.8*  --   HGB 7.4* 7.5* 7.4* 7.9*  HCT 24.0* 24.4* 23.3* 24.3*  MCV 102.1* 102.1* 101.3* 101.7*  PLT 121* 175 196 [id number]    Basic Metabolic Panel: Recent Labs  Lab 11/21/20 0436 11/21/20 1600 11/22/20 0601 11/22/20 1835 11/23/20 0148 11/23/20 1657 11/24/20 0342 11/25/20 0135 11/26/20 0147 11/27/20 0138  NA 140   < > 145   < > 146* 145 144 141 137 140  K 3.9   < > 4.5   < > 4.3 4.2 4.2 4.1 4.4 4.4  CL 104   < > 111   < > 114* 113* 111 109 106 108  CO2 22   < > 20*   < > 20* 20* 19* 18* 17* 18*  GLUCOSE 125*   < > 105*   < > 150* 134* 132* 139* 139* 140*  BUN 102*   < > 112*   < > 119* 119* 117* 113* 109* 100*  CREATININE 3.15*   < > 3.40*   < >  3.38* 3.07* 2.86* 2.30* 1.99* 1.78*  CALCIUM 8.6*   < > 8.8*   < > 9.1 9.2 9.1 9.1 9.0 9.2  MG 2.5*  --  2.7*  --  2.6*  --   --   --   --  2.0  PHOS 8.6*   < > 8.6*   < > 7.3* 6.4* 6.3* 6.5* 6.3* 7.2*   < > = values in this interval not displayed.   GFR: Estimated Creatinine Clearance: 58.9 mL/min (A) (by C-G formula based on SCr of 1.78 mg/dL (H)). Recent Labs  Lab 11/21/20 0436 11/22/20 0601 11/23/20 0148 11/27/20 0138  WBC 9.4 11.4* 12.5* 7.2    Liver Function Tests: Recent Labs  Lab 11/23/20 1657 11/24/20 0342 11/25/20 0135 11/26/20 0147 11/27/20 0138  AST  --   --   --   --  26  ALT  --   --   --   --  29  ALKPHOS  --   --   --   --  76  BILITOT  --   --   --   --  0.6  PROT  --   --   --   --  5.9*   ALBUMIN 2.3* 2.3* 2.2* 2.3* 2.3*   No results for input(s): LIPASE, AMYLASE in the last 168 hours. No results for input(s): AMMONIA in the last 168 hours.  ABG    Component Value Date/Time   PHART 7.461 (H) 11/17/2020 1155   PCO2ART 35.7 11/17/2020 1155   PO2ART 70.8 (L) 11/17/2020 1155   HCO3 25.1 11/17/2020 1155   ACIDBASEDEF 1.3 11/10/2020 0625   O2SAT 93.8 11/17/2020 1155     Coagulation Profile: Recent Labs  Lab 11/21/20 0954  INR 1.1    Cardiac Enzymes: No results for input(s): CKTOTAL, CKMB, CKMBINDEX, TROPONINI in the last 168 hours.  HbA1C: Hgb A1c MFr Bld  Date/Time Value Ref Range Status  10/22/2020 03:38 AM 5.3 4.8 - 5.6 % Final    Comment:    (NOTE)         Prediabetes: 5.7 - 6.4         Diabetes: >6.4         Glycemic control for adults with diabetes: <7.0     CBG: Recent Labs  Lab 11/26/20 1937 11/26/20 2338 11/27/20 0332 11/27/20 0717 11/27/20 1156  GLUCAP 103* 123* 122* 111* 126*     The patient is critically ill with multiple organ systems failure and requires high complexity decision making for assessment and support, frequent evaluation and titration of therapies, application of advanced monitoring technologies and extensive interpretation of multiple databases.   Critical Care Time devoted to patient care services described in this note is 31 minutes. This time reflects time of care of this Benson NP . This critical care time does not reflect separately billable procedures or procedure time, but  could involve care discussion time.   Lenice Llamas, MD Pulmonary and [place] Pager: see AMION [phone]

## 2020-11-28 LAB — RENAL FUNCTION PANEL
Albumin: 2.3 g/dL — ABNORMAL LOW (ref 3.5–5.0)
Anion gap: 12 (ref 5–15)
BUN: 90 mg/dL — ABNORMAL HIGH (ref 6–20)
CO2: 19 mmol/L — ABNORMAL LOW (ref 22–32)
Calcium: 9.2 mg/dL (ref 8.9–10.3)
Chloride: 114 mmol/L — ABNORMAL HIGH (ref 98–111)
Creatinine, Ser: 1.6 mg/dL — ABNORMAL HIGH (ref 0.61–1.24)
GFR, Estimated: 50 mL/min — ABNORMAL LOW (ref >60)
Glucose, Bld: 137 mg/dL — ABNORMAL HIGH (ref 70–99)
Phosphorus: 5.7 mg/dL — ABNORMAL HIGH (ref 2.5–4.6)
Potassium: 4.3 mmol/L (ref 3.5–5.1)
Sodium: 145 mmol/L (ref 135–145)

## 2020-11-28 LAB — GLUCOSE, CAPILLARY
Glucose-Capillary: 130 mg/dL — ABNORMAL HIGH (ref 70–99)
Glucose-Capillary: 117 mg/dL — ABNORMAL HIGH (ref 70–99)
Glucose-Capillary: 131 mg/dL — ABNORMAL HIGH (ref 70–99)
Glucose-Capillary: 107 mg/dL — ABNORMAL HIGH (ref 70–99)
Glucose-Capillary: 135 mg/dL — ABNORMAL HIGH (ref 70–99)

## 2020-11-28 NOTE — Progress Notes (Signed)
Nutrition Follow-up  DOCUMENTATION CODES:   Non-severe (moderate) malnutrition in context of acute illness/injury  INTERVENTION:   Continue TF via G-tube:  Vital 1.5 at 65 ml/h (1560 ml per day) Prosource TF 90 ml TID  Provides 2580 kcal, 171 gm protein, 1192 ml free water daily.  Recommend getting updated weight.   NUTRITION DIAGNOSIS:   Moderate Malnutrition related to acute illness (COVID-19) as evidenced by moderate fat depletion,moderate muscle depletion,mild fat depletion,mild muscle depletion.  Ongoing  GOAL:   Provide needs based on ASPEN/SCCM guidelines  Met via TF  MONITOR:   Vent status,Labs,Weight trends,TF tolerance,Skin  REASON FOR ASSESSMENT:   Ventilator,Consult Enteral/tube feeding initiation and management  ASSESSMENT:   60 year old male with no significant medical history. He was found to be COVID-19 positive on 11/26. He presented to [facility] with profound hypoxemia on 11/28 and was emergently intubated in the ED; found to be in ARDS and transferred to [facility].  Discussed pt in ICU rounds. PEG placed 12/30. Pt currently on trach collar at 40%. Vent support at night. No longer receiving HD. Renal signed off. HD cath and PICC removed 12/31. Pt continues to working with OT/PT but remains very weak d/t profound deconditioning.   Pt currently receiving tube feeds via G-tube of Vital 1.5 at goal of 65 mL/hr x 24 hours. PS TF 90 mL TID. 200 mL FWF q 4 hours.   Weight at admission: 120 kg. Last weight on 12/31: 110 kg. Recommend getting updated weight to monitor weight trends.   Labs reviewed: Chloride: 114, Phosphorus 5.7, CBG's x 24 hours: 117-131  Medications reviewed and include: Sodium bicarbonate BID, Pepcid, Aranesp, Tradjenta  Diet Order:   Diet Order            Diet NPO time specified  Diet effective now                Nutrition Focused Physical Exam completed 12/30:  Flowsheet Row Most Recent Value  Orbital Region Moderate  depletion  Upper Arm Region No depletion  Thoracic and Lumbar Region Mild depletion  Buccal Region Mild depletion  Temple Region Moderate depletion  Clavicle Bone Region Moderate depletion  Clavicle and Acromion Bone Region Moderate depletion  Scapular Bone Region Mild depletion  Dorsal Hand No depletion  Patellar Region Mild depletion  Anterior Thigh Region Mild depletion  Posterior Calf Region Moderate depletion  Edema (RD Assessment) Moderate  Hair Reviewed  Eyes Reviewed  Mouth Reviewed  Skin Reviewed  Nails Reviewed       EDUCATION NEEDS:   No education needs have been identified at this time  Skin:  Skin Assessment: Skin Integrity Issues: Skin Integrity Issues:: DTI,Unstageable,Other (Comment) DTI: sacrum, toe Unstageable: full thickness to R nose Other: MASD buttocks  Last BM:  11/28/2020  Height:   Ht Readings from Last 1 Encounters:  11/11/20 6' (1.829 m)    Weight:   Wt Readings from Last 1 Encounters:  11/22/20 110.9 kg     BMI:  Body mass index is 33.16 kg/m.  Estimated Nutritional Needs:   Kcal:  0623-7628  Protein:  160-180 gm  Fluid:  >/= 2.5 L    Ronnald Nian, Dietetic Intern Pager: [phone] If unavailable: [phone]

## 2020-11-28 NOTE — Progress Notes (Signed)
Patient placed back on vent due to increased WOB. Patient currently tolerating well.

## 2020-11-28 NOTE — Progress Notes (Signed)
Physical Therapy Treatment Patient Details Name: Jackson Figueroa MRN: [id number] DOB: [birth year] Today's Date: 11/28/2020    History of Present Illness 60 yo admitted 11/28 with respiratory distress and unresponsive at home with Covid 19. Pt with AKI and HD dependent. CRRT 12/16-12/26. Trach 12/23. encephalopathy since admission. PEG 12/30.PMHx: obesity    PT Comments    Pt awake, alert and mouthing words throughout session. Pt with maintained gross weakness throughout with very limited grip and bil UE strength but improving bil LE strength and able to perform mini squats at 30 and 40 degree tilt. Pt placed in chair position end of session and hope to attempt increased mobility/transfer attempts next session. Pt encouraged to continue HEP as pt mobility will be very limited until generalized strength improves.   15 degree tilt for 7 min 114/79, HR 109 30 degree tilt for 3 min 113/85 40 degree tilt 8 min 112/75, HR 130   Follow Up Recommendations  Supervision/Assistance - 24 hour;SNF     Equipment Recommendations  Hospital bed;Wheelchair (measurements PT);Wheelchair cushion (measurements PT)    Recommendations for Other Services       Precautions / Restrictions Precautions Precautions: Fall Precaution Comments: PEG, trach, flexiseal    Mobility  Bed Mobility               General bed mobility comments: did not attempt  Transfers     Transfers: Sit to/from Stand Sit to Stand: Total assist         General transfer comment: Tilt to standing to 40 degrees  Ambulation/Gait             General Gait Details: unable   Stairs             Wheelchair Mobility    Modified Rankin (Stroke Patients Only)       Balance Overall balance assessment: Needs assistance     Sitting balance - Comments: trunk supported on surface in sitting with tendency for left lean, pillow under left flank to achieve midline and maintain with tilt   Standing balance support: No  upper extremity supported;During functional activity Standing balance-Leahy Scale: Zero Standing balance comment: pt with strapping of trunk and legs in standing at 40 degrees, with increase to 45 pt with flexed head and shoulders and unable to extend against surface                            Cognition Arousal/Alertness: Awake/alert Behavior During Therapy: Flat affect Overall Cognitive Status: Difficult to assess Area of Impairment: Following commands;Attention;Orientation                 Orientation Level: Disoriented to;Time;Place (pt stating year as 2023, correct month not day and oriented to hospital but not name) Current Attention Level: Sustained   Following Commands: Follows one step commands with increased time       General Comments: pt mouthing words throught with grossly 50% intelligible, pt following command for bil LE and UE movement with delay and fatigue limited also by weakness      Exercises General Exercises - Upper Extremity Shoulder Flexion: AAROM;Both;10 reps;Standing Elbow Flexion: Both;10 reps;AAROM;Standing Elbow Extension: AAROM;Both;Standing;10 reps Wrist Flexion: AAROM;Both;5 reps;Standing Digit Composite Flexion: AAROM;Both;5 reps;Seated General Exercises - Lower Extremity Short Arc Quad: AAROM;Both;Seated;10 reps Heel Slides: AAROM;Both;10 reps;Supine Hip ABduction/ADduction: AAROM;Both;Supine;10 reps    General Comments        Pertinent Vitals/Pain Pain Assessment: No/denies pain    Home  Living                      Prior Function            PT Goals (current goals can now be found in the care plan section) Progress towards PT goals: Progressing toward goals    Frequency    Min 3X/week      PT Plan Current plan remains appropriate    Co-evaluation              AM-PAC PT "6 Clicks" Mobility   Outcome Measure  Help needed turning from your back to your side while in a flat bed without using  bedrails?: Total Help needed moving from lying on your back to sitting on the side of a flat bed without using bedrails?: Total Help needed moving to and from a bed to a chair (including a wheelchair)?: Total Help needed standing up from a chair using your arms (e.g., wheelchair or bedside chair)?: Total Help needed to walk in hospital room?: Total Help needed climbing 3-5 steps with a railing? : Total 6 Click Score: 6    End of Session   Activity Tolerance: Patient tolerated treatment well Patient left: in bed;with call bell/phone within reach Nurse Communication: Mobility status;Need for lift equipment PT Visit Diagnosis: Other abnormalities of gait and mobility (R26.89);Difficulty in walking, not elsewhere classified (R26.2);Other symptoms and signs involving the nervous system (R29.898);Muscle weakness (generalized) (M62.81)     Time: 6384-6659 PT Time Calculation (min) (ACUTE ONLY): 46 min  Charges:  $Therapeutic Exercise: 23-37 mins $Therapeutic Activity: 8-22 mins                     Maisyn Nouri P, PT Acute Rehabilitation Services Pager: [phone] Office: [phone]    Glyn Zendejas B Teryn Boerema 11/28/2020, 1:46 PM

## 2020-11-28 NOTE — Progress Notes (Signed)
Patient placed on 40% trach collar, currently tolerating well all vitals are stable. RT will continue to monitor.

## 2020-11-28 NOTE — Progress Notes (Signed)
NAME:  Jackson Figueroa, MRN:  [id number], DOB:  Apr 08, 1961, LOS: 67 ADMISSION DATE:  10/20/2020, CONSULTATION DATE:  11/29 REFERRING MD:  Sabra Heck, CHIEF COMPLAINT:  ARDS COVID 96   Brief History:  60 year old male with no significant past medical history and a positive COVID-19 test on 11/26 presented to [facility] with profound hypoxemia on 11/28. Intubated immediately upon arrival found to be in ARDS. Transferred to [facility] long hospital for ongoing ICU care  11/29.  Past Medical History:  Obesity   Significant Hospital Events:  11/28 Presented to [facility] ER, intubated for hypoxemia, empiric abx, shock on levo. COVID Rx 11/29 Persistent shock, central line placed, ARDS protocol continued, proning initiated 11/30 Pressor requirements continued, NMB initiated to facilitate ventilation tube feeds started, initiated proning 12/01 Stopping NMB, proning positioning protocol discontinued, free water for rising creatinine and sodium 12/02 PEEP trending down. Renal function improved. Changed to versed from prop gtt d/t rising triglycerides  12/03 Remdesivir completed. Peep/FIO2 stable. Free water adjusted. Solumedrol reduced, fentanyl to Dilaudid, adding Oxycodone 12/04 Dyssynchrony, given paralytic overnight, changed to ketamine; vomited / aspirated, restarted paralytics 12/06 Proned, under NMB 12/07 Proning, NMB continued. Switched antibiotics for stenotrophomonas 12/13 Stopped oxycodone, added dilaudid oral, continuous nimbex infusion to prn, palliative care consulted 12/14 Changed to pressure control 12/15 Worsening vent synchrony after clamping OG tube 12/18 added reglan IV for ? Ileus developing  12/18 back on paralytics for "guppy breathing/ air stacking" on PCV ? ,mucus plug  12/19  Back on PCV and off paralytics  12/20 having intermittent fevers, atrial fibrillation with some ST changes.  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 Started on minocycline per ID 12/22 stable decreased steroid dosing. Clonazepam added 12/23 trach placed. Versed off. Changed dilaudid from 4mg -.3mg , added clonidine and increased depakote. Added midodrine. OGT out during surg. Post-pyloric dislodged. Tip in stomach. Attempting trickle feeds w/ reglan  12/24 NG tube placed by IR 12/25 Dilaudid weaned to off, tolerated trach collar 12/26 tolerated weaning for about 4 hours on trach collar 12/30 G tube with IR  Consults:   Nephrology 12/14 Palliative 12/13 ID signed off 12/17  Procedures:  ETT 11/28 >>trach 12/23 Bates CVL 11/29 >> 12/9 R PICC 12/9 >>  R IJ HD Cath 12/17 >> G tube 12/30>>  Significant Diagnostic Tests:   ECHO 11/29 >>LVEF 50-55%, mild LVH, RV systolic function mildly reduced. Mild dilation of ascending aorta 60mm. LE Venous Duplex 12/8 >> negative for DVT   Micro Data:   COVID 11/28 >> Positive  Influenza A/B 11/28 >> negative  UC 11/28 >> negative  BCx2 11/28 >> negative  Tracheal aspirate 12/5 >> Stenotrophomonas maltophilia >> S-bactrim, levofloxacin UC 12/11 >> negative  Tracheal aspirate 12/11 >> Stenotrophomonas maltophilia >> S-bactrim, levofloxacin  BCx2 12/11 >> negative  Trach asp 12/ 19 >>> multidrug resistance stenotrophomonas Tracheal stoma 12/28> MDR moderate strep mitis/oralis, rare steno  Antimicrobials:   CTX 11/28 > 11/29 Azithromycin 11/28 > 11/29 Remdesivir 11/28 > 12/2 Prednisone 11/28 >  Baricitinib 12/4 > 12/5 Vanco 12/5 >> 12/7, 12/11 x1 Cefepime 12/5 >> 12/7, 12/11 >> 12/13 Levaquin 12/7 >> 12/16 Minocycline 12/21>12/28, 12/31>>   Rocephin 12/31>>  Interim History / Subjective:  Awake, alert, on TC this morning 40% and working with OT.    Objective   Blood pressure 119/77, pulse (!) 103, temperature 99.1  F (37.3 C), temperature source Axillary, resp. rate (!) 25, height 6' (1.829 m), weight 110.9 kg, SpO2 98 %.    Vent Mode: PCV FiO2 (%):  [40 %] 40 % Set Rate:  [15 bmp] 15 bmp PEEP:  [5 cmH20] 5 cmH20   Intake/Output Summary (Last 24 hours) at 11/28/2020 0938 Last data filed at 11/28/2020 0700 Gross per 24 hour  Intake 1290 ml  Output 2675 ml  Net -1385 ml   Filed Weights   11/19/20 0500 11/21/20 0447 11/22/20 0500  Weight: 108.3 kg 108.5 kg 110.9 kg    Examination:  General:  Supine in bed on ATC 40%, in NAD HENT: NCAT Tracheostomy in place and secure, MM pink and moist PULM:Bilateral chest excursion,  Rhonchi bilaterally B, copious thin white to clear secretions,  CV: S1, S2, RRR, tachycardic,  no mgr GI: BS+, soft, non tender, tolerating TF MSK: normal bulk and tone, weak and deconditioned after long critical illness Neuro: awake, denies pain, follows commands   Resolved Hospital Problem list   Oliguric AKI - improved. Renal signed off.   Assessment & Plan:  Acute hypoxemic and hypercapnic respiratory failure ARDS due to COVID 19 Trach care per routine Continue TC trials. Did about 20 hours yesterday (until midnight.) Will continue tonight for another 20 hours. Tomorrow if does ok needs 24 stretch.  When using full vent support, use pressure control ventilation. Rest on vent at night tonight. Will consult SLP for PMV use.   Acute Metabolic encephalopathy improving Try to re-establish sleep wake cycle Continue seroquel qHS  MDR Stenoptrophomonas pneumonia Ceftriaxone and minocycline per ID  Profound deconditioning PT/OT following  Anemia of chronic illness Monitor for bleeding Transfuse PRBC for Hgb < 7 gm/dL  Tissue injury sacrum, toes Wound care  Best practice (evaluated daily)  Diet: tube feeding Pain/Anxiety/Delirium protocol (if indicated): no PAD, minimize sedation VAP protocol (if indicated): yes DVT prophylaxis: Sub q heparin GI prophylaxis:  Pantoprazole for stress ulcer prophylaxis Glucose control: monitor Mobility: PT working with patient Disposition: SNF. Uninsured and not eligible for LTACH   Goals of Care:  Last date of multidisciplinary goals of care discussion: 11/27/20 Family and staff present: girlfriend Summary of discussion: full code, hoping for functional recovery. Updated girlfriend that plan is for vent liberation. not eligible for ltach Follow up goals of care discussion due: 12/04/20 Code Status: full  Labs   CBC: Recent Labs  Lab 11/22/20 0601 11/23/20 0148 11/27/20 0138  WBC 11.4* 12.5* 7.2  NEUTROABS 9.3* 9.8*  --   HGB 7.5* 7.4* 7.9*  HCT 24.4* 23.3* 24.3*  MCV 102.1* 101.3* 101.7*  PLT 175 196 [id number]    Basic Metabolic Panel: Recent Labs  Lab 11/22/20 0601 11/22/20 1835 11/23/20 0148 11/23/20 1657 11/24/20 0342 11/25/20 0135 11/26/20 0147 11/27/20 0138 11/28/20 0146  NA 145   < > 146*   < > 144 141 137 140 145  K 4.5   < > 4.3   < > 4.2 4.1 4.4 4.4 4.3  CL 111   < > 114*   < > 111 109 106 108 114*  CO2 20*   < > 20*   < > 19* 18* 17* 18* 19*  GLUCOSE 105*   < > 150*   < > 132* 139* 139* 140* 137*  BUN 112*   < > 119*   < > 117* 113* 109* 100* 90*  CREATININE 3.40*   < > 3.38*   < > 2.86* 2.30* 1.99* 1.78*  1.60*  CALCIUM 8.8*   < > 9.1   < > 9.1 9.1 9.0 9.2 9.2  MG 2.7*  --  2.6*  --   --   --   --  2.0  --   PHOS 8.6*   < > 7.3*   < > 6.3* 6.5* 6.3* 7.2* 5.7*   < > = values in this interval not displayed.   GFR: Estimated Creatinine Clearance: 65.5 mL/min (A) (by C-G formula based on SCr of 1.6 mg/dL (H)). Recent Labs  Lab 11/22/20 0601 11/23/20 0148 11/27/20 0138  WBC 11.4* 12.5* 7.2    Liver Function Tests: Recent Labs  Lab 11/24/20 0342 11/25/20 0135 11/26/20 0147 11/27/20 0138 11/28/20 0146  AST  --   --   --  26  --   ALT  --   --   --  29  --   ALKPHOS  --   --   --  76  --   BILITOT  --   --   --  0.6  --   PROT  --   --   --  5.9*  --   ALBUMIN 2.3* 2.2*  2.3* 2.3* 2.3*   No results for input(s): LIPASE, AMYLASE in the last 168 hours. No results for input(s): AMMONIA in the last 168 hours.  ABG    Component Value Date/Time   PHART 7.461 (H) 11/17/2020 1155   PCO2ART 35.7 11/17/2020 1155   PO2ART 70.8 (L) 11/17/2020 1155   HCO3 25.1 11/17/2020 1155   ACIDBASEDEF 1.3 11/10/2020 0625   O2SAT 93.8 11/17/2020 1155     Coagulation Profile: Recent Labs  Lab 11/21/20 0954  INR 1.1    Cardiac Enzymes: No results for input(s): CKTOTAL, CKMB, CKMBINDEX, TROPONINI in the last 168 hours.  HbA1C: Hgb A1c MFr Bld  Date/Time Value Ref Range Status  10/22/2020 03:38 AM 5.3 4.8 - 5.6 % Final    Comment:    (NOTE)         Prediabetes: 5.7 - 6.4         Diabetes: >6.4         Glycemic control for adults with diabetes: <7.0     CBG: Recent Labs  Lab 11/27/20 1643 11/27/20 1951 11/27/20 2330 11/28/20 0331 11/28/20 0820  GLUCAP 120* 124* 120* 130* 117*     The patient is critically ill with multiple organ systems failure and requires high complexity decision making for assessment and support, frequent evaluation and titration of therapies, application of advanced monitoring technologies and extensive interpretation of multiple databases.   Critical Care Time devoted to patient care services described in this note is 33 minutes. This time reflects time of care of this signee Charlott Holler NP . This critical care time does not reflect separately billable procedures or procedure time, but  could involve care discussion time.   Durel Salts, MD Pulmonary and Critical Care Medicine  HealthCare Pager: see AMION Office:[phone]

## 2020-11-28 NOTE — Therapy (Addendum)
Pt taken off the vent at 2350 and placed on 40% ATC currently tolerating well with no distress noted. Will continue to monitor.

## 2020-11-29 LAB — GLUCOSE, CAPILLARY
Glucose-Capillary: 113 mg/dL — ABNORMAL HIGH (ref 70–99)
Glucose-Capillary: 115 mg/dL — ABNORMAL HIGH (ref 70–99)
Glucose-Capillary: 117 mg/dL — ABNORMAL HIGH (ref 70–99)
Glucose-Capillary: 118 mg/dL — ABNORMAL HIGH (ref 70–99)
Glucose-Capillary: 124 mg/dL — ABNORMAL HIGH (ref 70–99)
Glucose-Capillary: 126 mg/dL — ABNORMAL HIGH (ref 70–99)
Glucose-Capillary: 135 mg/dL — ABNORMAL HIGH (ref 70–99)

## 2020-11-29 MED ORDER — SODIUM BICARBONATE 650 MG PO TABS
650.0000 mg | ORAL_TABLET | Freq: Two times a day (BID) | ORAL | Status: DC
  Administered 2020-11-29 – 2020-12-14 (×30): 650 mg
  Filled 2020-11-29 (×30): qty 1

## 2020-11-29 MED ORDER — QUETIAPINE FUMARATE 50 MG PO TABS
50.0000 mg | ORAL_TABLET | Freq: Every day | ORAL | Status: DC
  Administered 2020-11-29 – 2020-12-13 (×15): 50 mg
  Filled 2020-11-29 (×15): qty 1

## 2020-11-29 NOTE — Evaluation (Signed)
Clinical/Bedside Swallow Evaluation Patient Details  Name: Jackson Figueroa MRN: [id number] Date of Birth: 02/13/1961  Today's Date: 11/29/2020 Time: SLP Start Time (ACUTE ONLY): 1450 SLP Stop Time (ACUTE ONLY): 1514 SLP Time Calculation (min) (ACUTE ONLY): 24 min  Past Medical History:  Past Medical History:  Diagnosis Date  . Obesity    Past Surgical History:  Past Surgical History:  Procedure Laterality Date  . IR GASTROSTOMY TUBE MOD SED  11/21/2020  . TRACHEOSTOMY TUBE PLACEMENT N/A 11/14/2020   Procedure: TRACHEOSTOMY;  Surgeon: Melida Quitter, MD;  Location: [facility]S;  Service: ENT;  Laterality: N/A;   HPI:  60 yo admitted 11/28 with respiratory distress and unresponsive at home with Covid 19. Pt with AKI and HD dependent. CRRT 12/16-12/26. Intubated 11/28 until trach 12/23. Encephalopathy since admission. PEG 12/30. PMHx: obesity   Assessment / Plan / Recommendation Clinical Impression  Swallowing evaluation was performed with cuff deflated and PMV in place. Pt's vocal quality sounds pretty clear considering prolonged length of intubation, although it has also been ~2 weeks since he was trached. Suspect a component of deconditioning and post-extubation dysphagia as evidenced by minimal hyolaryngeal movement noted to palpation and frequent coughing observed wtih ice chips. Recommend that he remain NPO for now, with likely need for instrumental testing. Will f/u for readiness. SLP Visit Diagnosis: Dysphagia, unspecified (R13.10)    Aspiration Risk  Moderate aspiration risk    Diet Recommendation NPO   Medication Administration: Via alternative means    Other  Recommendations Oral Care Recommendations: Oral care QID   Follow up Recommendations Skilled Nursing facility      Frequency and Duration min 2x/week  2 weeks       Prognosis Prognosis for Safe Diet Advancement: Good Barriers to Reach Goals: Cognitive deficits      Swallow Study   General HPI: 60 yo admitted 11/28  with respiratory distress and unresponsive at home with Covid 19. Pt with AKI and HD dependent. CRRT 12/16-12/26. Intubated 11/28 until trach 12/23. Encephalopathy since admission. PEG 12/30. PMHx: obesity Type of Study: Bedside Swallow Evaluation Previous Swallow Assessment: none in chart Diet Prior to this Study: NPO;PEG tube Temperature Spikes Noted: No Respiratory Status: Trach;Trach Collar Trach Size and Type: Cuff;#8;Deflated;With PMSV in place History of Recent Intubation: Yes Length of Intubations (days): 24 days Date extubated:  (trach 12/23) Behavior/Cognition: Alert;Cooperative;Confused Oral Cavity Assessment: Dry Oral Care Completed by SLP: Yes Oral Cavity - Dentition: Adequate natural dentition Vision: Functional for self-feeding Self-Feeding Abilities: Total assist Patient Positioning: Upright in bed Baseline Vocal Quality: Normal Volitional Cough: Strong Volitional Swallow: Unable to elicit    Oral/Motor/Sensory Function Overall Oral Motor/Sensory Function: Within functional limits   Ice Chips Ice chips: Impaired Presentation: Spoon Pharyngeal Phase Impairments: Decreased hyoid-laryngeal movement;Cough - Immediate;Cough - Delayed   Thin Liquid Thin Liquid: Not tested    Nectar Thick Nectar Thick Liquid: Not tested   Honey Thick Honey Thick Liquid: Not tested   Puree Puree: Not tested   Solid     Solid: Not tested      [name]., M.A. [place] Pager [phone] Office [phone]  11/29/2020,4:02 PM

## 2020-11-29 NOTE — Progress Notes (Signed)
NAME:  Jackson Figueroa, MRN:  [id number], DOB:  1961-07-15, LOS: 102 ADMISSION DATE:  10/20/2020, CONSULTATION DATE:  11/29 REFERRING MD:  Sabra Heck, CHIEF COMPLAINT:  ARDS COVID 93   Brief History:  60 year old male with no significant past medical history and a positive COVID-19 test on 11/26 presented to [facility] with profound hypoxemia on 11/28. Intubated immediately upon arrival found to be in ARDS. Transferred to [facility] long hospital for ongoing ICU care  11/29.  Past Medical History:  Obesity   Significant Hospital Events:  11/28 Presented to [facility] ER, intubated for hypoxemia, empiric abx, shock on levo. COVID Rx 11/29 Persistent shock, central line placed, ARDS protocol continued, proning initiated 11/30 Pressor requirements continued, NMB initiated to facilitate ventilation tube feeds started, initiated proning 12/01 Stopping NMB, proning positioning protocol discontinued, free water for rising creatinine and sodium 12/02 PEEP trending down. Renal function improved. Changed to versed from prop gtt d/t rising triglycerides  12/03 Remdesivir completed. Peep/FIO2 stable. Free water adjusted. Solumedrol reduced, fentanyl to Dilaudid, adding Oxycodone 12/04 Dyssynchrony, given paralytic overnight, changed to ketamine; vomited / aspirated, restarted paralytics 12/06 Proned, under NMB 12/07 Proning, NMB continued. Switched antibiotics for stenotrophomonas 12/13 Stopped oxycodone, added dilaudid oral, continuous nimbex infusion to prn, palliative care consulted 12/14 Changed to pressure control 12/15 Worsening vent synchrony after clamping OG tube 12/18 added reglan IV for ? Ileus developing  12/18 back on paralytics for "guppy breathing/ air stacking" on PCV ? ,mucus plug  12/19  Back on PCV and off paralytics  12/20 having intermittent fevers, atrial fibrillation with some ST changes.  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 Started on minocycline per ID 12/22 stable decreased steroid dosing. Clonazepam added 12/23 trach placed. Versed off. Changed dilaudid from 4mg -.3mg , added clonidine and increased depakote. Added midodrine. OGT out during surg. Post-pyloric dislodged. Tip in stomach. Attempting trickle feeds w/ reglan  12/24 NG tube placed by IR 12/25 Dilaudid weaned to off, tolerated trach collar 12/26 tolerated weaning for about 4 hours on trach collar 12/30 G tube with IR  Consults:   Nephrology 12/14 Palliative 12/13 ID signed off 12/17  Procedures:  ETT 11/28 >>trach 12/23 Bates CVL 11/29 >> 12/9 R PICC 12/9 >>  R IJ HD Cath 12/17 >> G tube 12/30>>  Significant Diagnostic Tests:   ECHO 11/29 >>LVEF 50-55%, mild LVH, RV systolic function mildly reduced. Mild dilation of ascending aorta 87mm. LE Venous Duplex 12/8 >> negative for DVT   Micro Data:   COVID 11/28 >> Positive  Influenza A/B 11/28 >> negative  UC 11/28 >> negative  BCx2 11/28 >> negative  Tracheal aspirate 12/5 >> Stenotrophomonas maltophilia >> S-bactrim, levofloxacin UC 12/11 >> negative  Tracheal aspirate 12/11 >> Stenotrophomonas maltophilia >> S-bactrim, levofloxacin  BCx2 12/11 >> negative  Trach asp 12/ 19 >>> multidrug resistance stenotrophomonas Tracheal stoma 12/28> MDR moderate strep mitis/oralis, rare steno  Antimicrobials:   CTX 11/28 > 11/29 Azithromycin 11/28 > 11/29 Remdesivir 11/28 > 12/2 Prednisone 11/28 >  Baricitinib 12/4 > 12/5 Vanco 12/5 >> 12/7, 12/11 x1 Cefepime 12/5 >> 12/7, 12/11 >> 12/13 Levaquin 12/7 >> 12/16 Minocycline 12/21>12/28, 12/31>>   Rocephin 12/31>>  Interim History / Subjective:  Awake, alert, on vent this morning and then transitioned to TC 40%.   PT is working with patient and awaiting speech therapy with  PMV  No acute events overnight.   Objective   Blood pressure 133/84, pulse 98, temperature 99.2 F (37.3 C), temperature source Axillary, resp. rate 19, height 6' (1.829 m), weight 110.9 kg, SpO2 96 %.    Vent Mode: PCV FiO2 (%):  [40 %] 40 % Set Rate:  [15 bmp] 15 bmp PEEP:  [5 cmH20] 5 cmH20 Plateau Pressure:  [16 cmH20] 16 cmH20   Intake/Output Summary (Last 24 hours) at 11/29/2020 0806 Last data filed at 11/29/2020 0500 Gross per 24 hour  Intake 1750.06 ml  Output 2025 ml  Net -274.94 ml   Filed Weights   11/19/20 0500 11/21/20 0447 11/22/20 0500  Weight: 108.3 kg 108.5 kg 110.9 kg    Examination:  General:  Supine in bed on ATC 40%, in NAD HENT: NCAT Tracheostomy in place and secure, MM pink and moist PULM:Bilateral chest excursion,  Course breath sounds bilaterally, moderate thin white to clear secretions  CV: S1, S2, RRR, tachycardic,  no mgr GI: BS+, soft, non tender, tolerating TF MSK: normal bulk and tone, weak and deconditioned after long critical illness Neuro: awake, denies pain, follows commands   Resolved Hospital Problem list   Oliguric AKI - improved. Renal signed off.   Assessment & Plan:  Acute hypoxemic and hypercapnic respiratory failure ARDS due to COVID 19 Trach care per routine Continue TC trials  Will see if patient can remain off vent tonight.  If he tires out, place back on pressure control vent support Will consult SLP for PMV use.   Acute Metabolic encephalopathy improving Try to re-establish sleep wake cycle Continue seroquel qHS  MDR Stenoptrophomonas pneumonia Ceftriaxone and minocycline per ID  Profound deconditioning PT/OT following  Anemia of chronic illness Monitor for bleeding Transfuse PRBC for Hgb < 7 gm/dL  Tissue injury sacrum, toes Wound care  Best practice (evaluated daily)  Diet: tube feeding Pain/Anxiety/Delirium protocol (if indicated): no PAD, minimize sedation VAP protocol (if indicated): yes DVT  prophylaxis: Sub q heparin GI prophylaxis: Pantoprazole for stress ulcer prophylaxis Glucose control: monitor Mobility: PT working with patient Disposition: SNF. Uninsured and not eligible for LTACH   Goals of Care:  Last date of multidisciplinary goals of care discussion: 11/27/20 Family and staff present: girlfriend Summary of discussion: full code, hoping for functional recovery. Updated girlfriend that plan is for vent liberation. not eligible for ltach Follow up goals of care discussion due: 12/04/20 Code Status: full  Labs   CBC: Recent Labs  Lab 11/23/20 0148 11/27/20 0138  WBC 12.5* 7.2  NEUTROABS 9.8*  --   HGB 7.4* 7.9*  HCT 23.3* 24.3*  MCV 101.3* 101.7*  PLT 196 425    Basic Metabolic Panel: Recent Labs  Lab 11/23/20 0148 11/23/20 1657 11/24/20 0342 11/25/20 0135 11/26/20 0147 11/27/20 0138 11/28/20 0146  NA 146*   < > 144 141 137 140 145  K 4.3   < > 4.2 4.1 4.4 4.4 4.3  CL 114*   < > 111 109 106 108 114*  CO2 20*   < > 19* 18* 17* 18* 19*  GLUCOSE 150*   < > 132* 139* 139* 140* 137*  BUN 119*   < > 117* 113* 109* 100* 90*  CREATININE 3.38*   < > 2.86* 2.30* 1.99* 1.78* 1.60*  CALCIUM 9.1   < > 9.1 9.1 9.0 9.2 9.2  MG 2.6*  --   --   --   --  2.0  --   PHOS 7.3*   < >  6.3* 6.5* 6.3* 7.2* 5.7*   < > = values in this interval not displayed.   GFR: Estimated Creatinine Clearance: 65.5 mL/min (A) (by C-G formula based on SCr of 1.6 mg/dL (H)). Recent Labs  Lab 11/23/20 0148 11/27/20 0138  WBC 12.5* 7.2    Liver Function Tests: Recent Labs  Lab 11/24/20 0342 11/25/20 0135 11/26/20 0147 11/27/20 0138 11/28/20 0146  AST  --   --   --  26  --   ALT  --   --   --  29  --   ALKPHOS  --   --   --  76  --   BILITOT  --   --   --  0.6  --   PROT  --   --   --  5.9*  --   ALBUMIN 2.3* 2.2* 2.3* 2.3* 2.3*   No results for input(s): LIPASE, AMYLASE in the last 168 hours. No results for input(s): AMMONIA in the last 168 hours.  ABG    Component  Value Date/Time   PHART 7.461 (H) 11/17/2020 1155   PCO2ART 35.7 11/17/2020 1155   PO2ART 70.8 (L) 11/17/2020 1155   HCO3 25.1 11/17/2020 1155   ACIDBASEDEF 1.3 11/10/2020 0625   O2SAT 93.8 11/17/2020 1155     Coagulation Profile: No results for input(s): INR, PROTIME in the last 168 hours.  Cardiac Enzymes: No results for input(s): CKTOTAL, CKMB, CKMBINDEX, TROPONINI in the last 168 hours.  HbA1C: Hgb A1c MFr Bld  Date/Time Value Ref Range Status  10/22/2020 03:38 AM 5.3 4.8 - 5.6 % Final    Comment:    (NOTE)         Prediabetes: 5.7 - 6.4         Diabetes: >6.4         Glycemic control for adults with diabetes: <7.0     CBG: Recent Labs  Lab 11/28/20 1103 11/28/20 1540 11/28/20 2040 11/29/20 0006 11/29/20 0423  GLUCAP 131* 107* 135* 118* 115*     CC time: 40 minutes  Freda Jackson, MD [place] [facility] Office: [phone]   See Amion for Pager Details

## 2020-11-29 NOTE — TOC Progression Note (Signed)
Transition of Care [facility]) - Progression Note    Patient Details  Name: Jackson Figueroa MRN: [id number] Date of Birth: [birth year]  Transition of Care [facility]) CM/SW [place], RN Phone Number: 11/29/2020, 9:43 AM  Clinical Narrative:     Has been referred to [facility] for Medicaid, patient is back on full support via trach.  May need snf vent bed, but hopefully can wean off to trach collar. PT working with patient on strengthening.   Expected Discharge Plan: [place] Barriers to Discharge: Continued Medical Work up  Expected Discharge Plan and Services Expected Discharge Plan: [place]   Discharge Planning Services: CM Consult   Living arrangements for the past 2 months: Single Family Home                                       Social Determinants of Health (SDOH) Interventions    Readmission Risk Interventions No flowsheet data found.

## 2020-11-29 NOTE — Evaluation (Signed)
Clinical/Bedside Swallow Evaluation Patient Details  Name: Jackson Figueroa MRN: [id number] Date of Birth: [birth year]  Today's Date: 11/29/2020 Time: SLP Start Time (ACUTE ONLY): 1438 SLP Stop Time (ACUTE ONLY): 1450 SLP Time Calculation (min) (ACUTE ONLY): 12 min  Past Medical History:  Past Medical History:  Diagnosis Date  . Obesity    Past Surgical History:  Past Surgical History:  Procedure Laterality Date  . IR GASTROSTOMY TUBE MOD SED  11/21/2020  . TRACHEOSTOMY TUBE PLACEMENT N/A 11/14/2020   Procedure: TRACHEOSTOMY;  Surgeon: Melida Quitter, MD;  Location: [facility];  Service: ENT;  Laterality: N/A;   HPI:  60 yo admitted 11/28 with respiratory distress and unresponsive at home with Covid 19. Pt with AKI and HD dependent. CRRT 12/16-12/26. Intubated 11/28 until trach 12/23. Encephalopathy since admission. PEG 12/30. PMHx: obesity   Assessment / Plan / Recommendation Clinical Impression  Pt tolerated cuff deflation and PMV placement well, as evidenced by immediate production of phonation, stable VS, and no evidence of back pressure. Thick secretions were expelled via trach upon cuff deflation, but otherwise no audible secretions were noted. He has some confusion and at times forgets that he can vocalize, needing Mod cues throughout the session to either initiate voicing or increase volume to improve intelligibility. Recommend wearing PMV as much as tolerated, with intermittent supervision from staff. SLP Visit Diagnosis: Aphonia (R49.1)    Aspiration Risk       Diet Recommendation          Other  Recommendations     Follow up Recommendations Skilled Nursing facility      Frequency and Duration min 2x/week          Prognosis        Swallow Study   General HPI: 60 yo admitted 11/28 with respiratory distress and unresponsive at home with Covid 19. Pt with AKI and HD dependent. CRRT 12/16-12/26. Intubated 11/28 until trach 12/23. Encephalopathy since admission. PEG 12/30.  PMHx: obesity    Oral/Motor/Sensory Function     Ice Chips     Thin Liquid      Nectar Thick     Honey Thick     Puree     Solid            [name]., M.A. [place] [facility] Services Pager [phone] Office [phone]  11/29/2020,3:42 PM

## 2020-11-29 NOTE — Therapy (Signed)
Pt placed back on full support at 0440. See flowsheet

## 2020-11-29 NOTE — Progress Notes (Signed)
Occupational Therapy Treatment Patient Details Name: Jackson Figueroa MRN: [id number] DOB: September 09, 1961 Today's Date: 11/29/2020    History of present illness 60 yo admitted 11/28 with respiratory distress and unresponsive at home with Covid 19. Pt with AKI and HD dependent. CRRT 12/16-12/26. Trach 12/23. encephalopathy since admission. PEG 12/30.PMHx: obesity   OT comments  Pt seen in conjunction with PT to maximize pts activity tolerance as pt continues to present with generalized weakness and decreased activity tolerance. Pt able to mobilize from supine>EOB with MAX A +2, needing MAX A +1 to maintain static sitting EOB for UB/LB therex. Pt currently requires total A for BADLs at time time. HR 109-135 with mobility, SpO2 91-96% 40 % trach collar   144/79 supine , 141/83 EOB , 131/90 sitting in bed. Pt making efforts to verbalize with therapists during session however ~ 50% intelligible speech. Pt would continue to benefit from skilled occupational therapy while admitted and after d/c to address the below listed limitations in order to improve overall functional mobility and facilitate independence with BADL participation. DC plan remains appropriate, will follow acutely per POC.    Follow Up Recommendations  LTACH;SNF;Supervision/Assistance - 24 hour    Equipment Recommendations  Other (comment) (continue to assess)    Recommendations for Other Services      Precautions / Restrictions Precautions Precautions: Fall Precaution Comments: PEG, trach, flexiseal Restrictions Weight Bearing Restrictions: No       Mobility Bed Mobility Overal bed mobility: Needs Assistance Bed Mobility: Rolling;Sidelying to Sit;Sit to Sidelying Rolling: Max assist;+2 for physical assistance Sidelying to sit: Max assist;+2 for physical assistance     Sit to sidelying: Max assist;+2 for physical assistance General bed mobility comments: physical assist to bend knees, roll bil x 2, reach for rail. Physical assist  to clear legs off bed and elevate trunk. Assist to lift legs and control trunk. total +2 assist to slide toward [facility]  Transfers                 General transfer comment: NT    Balance Overall balance assessment: Needs assistance   Sitting balance-Leahy Scale: Zero Sitting balance - Comments: EOB 14 min with max assist with cues and assist for scapular retraction, neck extension (pt with very kyphotic trunk in sitting but reports not present PTA)                                   ADL either performed or assessed with clinical judgement   ADL Overall ADL's : Needs assistance/impaired             Lower Body Bathing: Total assistance;Bed level Lower Body Bathing Details (indicate cue type and reason): simulated via posterior pericare from bed level             Toileting- Clothing Manipulation and Hygiene: Total assistance;Bed level Toileting - Clothing Manipulation Details (indicate cue type and reason): posterior pericare from leeaking flexiseal from bed level     Functional mobility during ADLs: Maximal assistance;+2 for physical assistance (bed mobility only; supine>EOB) General ADL Comments: session focus bed mobility as precursor to higher level functional mobiity tasks. pt continues to persent with gross deconditioning and decreased activity tolerance     Vision       Perception     Praxis      Cognition Arousal/Alertness: Awake/alert Behavior During Therapy: Flat affect Overall Cognitive Status: Difficult to assess Area of Impairment:  Following commands;Attention                   Current Attention Level: Sustained   Following Commands: Follows one step commands with increased time       General Comments: pt mouthing words throught with grossly 50% intelligible, pt following command for bil LE and UE movement with delay and fatigue limited also by weakness        Exercises General Exercises - Upper Extremity Shoulder  Flexion: AAROM;Both;10 reps;Seated Shoulder Extension: AROM;Both;Seated;10 reps Elbow Flexion: Both;10 reps;AAROM;Seated Elbow Extension: AAROM;Both;10 reps;Seated Wrist Flexion: AAROM;Both;5 reps;Seated Wrist Extension: AAROM;Both;10 reps;Seated Digit Composite Flexion: AAROM;Both;5 reps;Seated Composite Extension: AAROM;Both;Seated;5 reps    Shoulder Instructions       General Comments HR 109-135 with mobility, SpO2 91-96% 40 % trach collar   144/79 supine   141/83 EOB   131/90 sitting in bed    Pertinent Vitals/ Pain       Pain Assessment: Faces Faces Pain Scale: Hurts little more Pain Location: periodic grimace with movement Pain Descriptors / Indicators: Grimacing Pain Intervention(s): Repositioned;Monitored during session  Home Living                                          Prior Functioning/Environment              Frequency  Min 2X/week        Progress Toward Goals  OT Goals(current goals can now be found in the care plan section)  Progress towards OT goals: Progressing toward goals  Acute Rehab OT Goals Patient Stated Goal: Go home OT Goal Formulation: With patient/family Time For Goal Achievement: 12/06/20 Potential to Achieve Goals: Good  Plan Discharge plan remains appropriate;Frequency remains appropriate    Co-evaluation      Reason for Co-Treatment: Complexity of the patient's impairments (multi-system involvement);For patient/therapist safety PT goals addressed during session: Mobility/safety with mobility;Strengthening/ROM;Balance        AM-PAC OT "6 Clicks" Daily Activity     Outcome Measure   Help from another person eating meals?: Total (PEG) Help from another person taking care of personal grooming?: Total Help from another person toileting, which includes using toliet, bedpan, or urinal?: Total Help from another person bathing (including washing, rinsing, drying)?: Total Help from another person to put on and  taking off regular upper body clothing?: Total Help from another person to put on and taking off regular lower body clothing?: Total 6 Click Score: 6    End of Session Equipment Utilized During Treatment: Oxygen;Other (comment) (trach collar 40% FiO2)  OT Visit Diagnosis: Other abnormalities of gait and mobility (R26.89);Unsteadiness on feet (R26.81);Muscle weakness (generalized) (M62.81)   Activity Tolerance Patient tolerated treatment well   Patient Left in bed;with call bell/phone within reach   Nurse Communication Mobility status        Time: 1540-0867 OT Time Calculation (min): 42 min  Charges: OT General Charges $OT Visit: 1 Visit OT Treatments $Therapeutic Activity: 8-22 mins  Harley Alto., COTA/L Acute Rehabilitation Services [phone]    Precious Haws 11/29/2020, 10:37 AM

## 2020-11-29 NOTE — TOC Progression Note (Signed)
Transition of Care [facility]) - Progression Note    Patient Details  Name: Jackson Figueroa MRN: [id number] Date of Birth: Jan 07, 1961  Transition of Care [facility]) CM/SW [place], RN Phone Number: 11/29/2020, 12:55 PM  Clinical Narrative:    Received email from [facility] if patient can sign, she is unable to reach family. Plan to have her come to bedside and  RN at bedside assist in identification and explanation to patient. Let the RN know this may happen today.    Expected Discharge Plan: [place] Barriers to Discharge: Continued Medical Work up  Expected Discharge Plan and Services Expected Discharge Plan: [place]   Discharge Planning Services: CM Consult   Living arrangements for the past 2 months: Single Family Home                                       Social Determinants of Health (SDOH) Interventions    Readmission Risk Interventions No flowsheet data found.

## 2020-11-29 NOTE — Progress Notes (Signed)
PSMV removed per pt request as he would like to get some rest. Pt tolerated well

## 2020-11-29 NOTE — Progress Notes (Signed)
Physical Therapy Treatment Patient Details Name: Jackson Figueroa MRN: [id number] DOB: Oct 10, 1961 Today's Date: 11/29/2020    History of Present Illness 60 yo admitted 11/28 with respiratory distress and unresponsive at home with Covid 19. Pt with AKI and HD dependent. CRRT 12/16-12/26. Trach 12/23. encephalopathy since admission. PEG 12/30.PMHx: obesity    PT Comments    Pt able to progress to transfers in bed and to EOb with +2 assist today. Pt with continued attempts of communication but unable to clearly mouth words. Pt with poor trunk control with assist for sitting balance with slow and continued improvement in bil LE strength. Will continue to progress function and encouraged assisting nursing with mobility. Pt in chair position in bed end of session.   HR 109-135 SpO2 91-96% on 40% trach collar 144/79 supine 141/83 EOB 131/90 sitting in bed end of session     Follow Up Recommendations  Supervision/Assistance - 24 hour;SNF     Equipment Recommendations  Hospital bed;Wheelchair (measurements PT);Wheelchair cushion (measurements PT)    Recommendations for Other Services       Precautions / Restrictions Precautions Precautions: Fall Precaution Comments: PEG, trach, flexiseal    Mobility  Bed Mobility Overal bed mobility: Needs Assistance Bed Mobility: Rolling;Sidelying to Sit;Sit to Sidelying Rolling: Max assist;+2 for physical assistance Sidelying to sit: Max assist;+2 for physical assistance     Sit to sidelying: Max assist;+2 for physical assistance General bed mobility comments: physical assist to bend knees, roll bil x 2, reach for rail. Physical assist to clear legs off bed and elevate trunk. Assist to lift legs and control trunk. total +2 assist to slide toward [facility]  Transfers                 General transfer comment: unable  Ambulation/Gait                 Stairs             Wheelchair Mobility    Modified Rankin (Stroke Patients  Only)       Balance Overall balance assessment: Needs assistance   Sitting balance-Leahy Scale: Zero Sitting balance - Comments: EOB 14 min with max assist with cues and assist for scapular retraction, neck extension (pt with very kyphotic trunk in sitting but reports not present PTA)                                    Cognition Arousal/Alertness: Awake/alert Behavior During Therapy: Flat affect Overall Cognitive Status: Difficult to assess Area of Impairment: Following commands;Attention                   Current Attention Level: Sustained   Following Commands: Follows one step commands with increased time       General Comments: pt mouthing words throught with grossly 50% intelligible, pt following command for bil LE and UE movement with delay and fatigue limited also by weakness      Exercises General Exercises - Lower Extremity Short Arc Quad: AAROM;Both;Seated;10 reps Heel Slides: AAROM;Both;10 reps;Supine    General Comments        Pertinent Vitals/Pain Pain Assessment: Faces Pain Location: periodic grimace with movement Pain Descriptors / Indicators: Grimacing Pain Intervention(s): Monitored during session;Repositioned    Home Living                      Prior Function  PT Goals (current goals can now be found in the care plan section) Progress towards PT goals: Progressing toward goals    Frequency    Min 3X/week      PT Plan Current plan remains appropriate    Co-evaluation PT/OT/SLP Co-Evaluation/Treatment: Yes Reason for Co-Treatment: Complexity of the patient's impairments (multi-system involvement);For patient/therapist safety PT goals addressed during session: Mobility/safety with mobility;Strengthening/ROM;Balance        AM-PAC PT "6 Clicks" Mobility   Outcome Measure  Help needed turning from your back to your side while in a flat bed without using bedrails?: A Lot Help needed moving from  lying on your back to sitting on the side of a flat bed without using bedrails?: Total Help needed moving to and from a bed to a chair (including a wheelchair)?: Total Help needed standing up from a chair using your arms (e.g., wheelchair or bedside chair)?: Total Help needed to walk in hospital room?: Total Help needed climbing 3-5 steps with a railing? : Total 6 Click Score: 7    End of Session   Activity Tolerance: Patient tolerated treatment well Patient left: in bed;with call bell/phone within reach Nurse Communication: Mobility status;Need for lift equipment PT Visit Diagnosis: Other abnormalities of gait and mobility (R26.89);Difficulty in walking, not elsewhere classified (R26.2);Other symptoms and signs involving the nervous system (R29.898);Muscle weakness (generalized) (M62.81)     Time: 8469-6295 PT Time Calculation (min) (ACUTE ONLY): 43 min  Charges:  $Therapeutic Activity: 23-37 mins                     Wynonia Medero P, PT Acute Rehabilitation Services Pager: [phone] Office: [place] Jackson Figueroa 11/29/2020, 9:58 AM

## 2020-11-30 DIAGNOSIS — E440 Moderate protein-calorie malnutrition: Secondary | ICD-10-CM | POA: Insufficient documentation

## 2020-11-30 LAB — GLUCOSE, CAPILLARY
Glucose-Capillary: 108 mg/dL — ABNORMAL HIGH (ref 70–99)
Glucose-Capillary: 135 mg/dL — ABNORMAL HIGH (ref 70–99)
Glucose-Capillary: 106 mg/dL — ABNORMAL HIGH (ref 70–99)
Glucose-Capillary: 125 mg/dL — ABNORMAL HIGH (ref 70–99)
Glucose-Capillary: 130 mg/dL — ABNORMAL HIGH (ref 70–99)

## 2020-11-30 MED ORDER — ACETAMINOPHEN 160 MG/5ML PO SOLN
650.0000 mg | Freq: Four times a day (QID) | ORAL | Status: DC | PRN
  Administered 2020-12-09 – 2020-12-12 (×4): 650 mg
  Filled 2020-11-30 (×5): qty 20.3

## 2020-11-30 NOTE — Progress Notes (Signed)
NAME:  Jackson Figueroa, MRN:  [id number], DOB:  01/12/1961, LOS: 32 ADMISSION DATE:  10/20/2020, CONSULTATION DATE:  11/29 REFERRING MD:  Sabra Heck, CHIEF COMPLAINT:  ARDS COVID 26   Brief History:  60 year old male with no significant past medical history and a positive COVID-19 test on 11/26 presented to [facility] with profound hypoxemia on 11/28. Intubated immediately upon arrival found to be in ARDS. Transferred to [facility] long hospital for ongoing ICU care  11/29.  Past Medical History:  Obesity   Significant Hospital Events:  11/28 Presented to [facility] ER, intubated for hypoxemia, empiric abx, shock on levo. COVID Rx 11/29 Persistent shock, central line placed, ARDS protocol continued, proning initiated 11/30 Pressor requirements continued, NMB initiated to facilitate ventilation tube feeds started, initiated proning 12/01 Stopping NMB, proning positioning protocol discontinued, free water for rising creatinine and sodium 12/02 PEEP trending down. Renal function improved. Changed to versed from prop gtt d/t rising triglycerides  12/03 Remdesivir completed. Peep/FIO2 stable. Free water adjusted. Solumedrol reduced, fentanyl to Dilaudid, adding Oxycodone 12/04 Dyssynchrony, given paralytic overnight, changed to ketamine; vomited / aspirated, restarted paralytics 12/06 Proned, under NMB 12/07 Proning, NMB continued. Switched antibiotics for stenotrophomonas 12/13 Stopped oxycodone, added dilaudid oral, continuous nimbex infusion to prn, palliative care consulted 12/14 Changed to pressure control 12/15 Worsening vent synchrony after clamping OG tube 12/18 added reglan IV for ? Ileus developing  12/18 back on paralytics for "guppy breathing/ air stacking" on PCV ? ,mucus plug  12/19  Back on PCV and off paralytics  12/20 having intermittent fevers, atrial fibrillation with some ST changes.  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 Started on minocycline per ID 12/22 stable decreased steroid dosing. Clonazepam added 12/23 trach placed. Versed off. Changed dilaudid from 4mg -.3mg , added clonidine and increased depakote. Added midodrine. OGT out during surg. Post-pyloric dislodged. Tip in stomach. Attempting trickle feeds w/ reglan  12/24 NG tube placed by IR 12/25 Dilaudid weaned to off, tolerated trach collar 12/26 tolerated weaning for about 4 hours on trach collar 12/30 G tube with IR  Consults:   Nephrology 12/14 Palliative 12/13 ID signed off 12/17  Procedures:  ETT 11/28 >>trach 12/23 [place] CVL 11/29 >> 12/9 R PICC 12/9 >>  R IJ HD Cath 12/17 >> G tube 12/30>>  Significant Diagnostic Tests:   ECHO 11/29 >>LVEF 50-55%, mild LVH, RV systolic function mildly reduced. Mild dilation of ascending aorta 37mm. LE Venous Duplex 12/8 >> negative for DVT   Micro Data:   COVID 11/28 >> Positive  Influenza A/B 11/28 >> negative  UC 11/28 >> negative  BCx2 11/28 >> negative  Tracheal aspirate 12/5 >> Stenotrophomonas maltophilia >> S-bactrim, levofloxacin UC 12/11 >> negative  Tracheal aspirate 12/11 >> Stenotrophomonas maltophilia >> S-bactrim, levofloxacin  BCx2 12/11 >> negative  Trach asp 12/ 19 >>> multidrug resistance stenotrophomonas Tracheal stoma 12/28> MDR moderate strep mitis/oralis, rare steno  Antimicrobials:   CTX 11/28 > 11/29 Azithromycin 11/28 > 11/29 Remdesivir 11/28 > 12/2 Prednisone 11/28 >  Baricitinib 12/4 > 12/5 Vanco 12/5 >> 12/7, 12/11 x1 Cefepime 12/5 >> 12/7, 12/11 >> 12/13 Levaquin 12/7 >> 12/16 Minocycline 12/21>12/28, 12/31>>   Rocephin 12/31>>  Interim History / Subjective:  Awake, alert, has mreained on trach collar 40% for >24 hours without issues.   Patient tolerating PMV valve. Remains NPO due to  swallowing weakness.  No acute events overnight.   Objective   Blood pressure (!) 139/105, pulse (!) 118, temperature 99.1 F (37.3 C), temperature source Oral, resp. rate (!) 23, height 6' (1.829 m), weight 110.9 kg, SpO2 94 %.    Vent Mode: Stand-by FiO2 (%):  [28 %-40 %] 28 %   Intake/Output Summary (Last 24 hours) at 11/30/2020 1125 Last data filed at 11/30/2020 0600 Gross per 24 hour  Intake 1620 ml  Output 2450 ml  Net -830 ml   Filed Weights   11/19/20 0500 11/21/20 0447 11/22/20 0500  Weight: 108.3 kg 108.5 kg 110.9 kg    Examination:  General:  Supine in bed on ATC 40%, in NAD HENT: NCAT Tracheostomy in place and secure, MM pink and moist. Thick oral secretions. PULM: course breath sounds. No wheezing or ronchi CV: S1S2, RRR, tachycardic,  no mgr GI: BS+, soft, non tender, tolerating TF MSK: normal bulk and tone, weak and deconditioned Neuro: awake, denies pain, follows commands   Resolved Hospital Problem list   Oliguric AKI - improved. Renal signed off.   Assessment & Plan:  Acute hypoxemic and hypercapnic respiratory failure ARDS due to COVID 19 Routine trach care Tolerating trach collar 40% SLP following for PMV and swallow evals  Acute Metabolic encephalopathy Improved Try to re-establish sleep wake cycle Continue seroquel qHS  MDR Stenoptrophomonas pneumonia Ceftriaxone and minocycline per ID  Profound deconditioning PT/OT following  Anemia of chronic illness Monitor for bleeding Transfuse PRBC for Hgb < 7 gm/dL  Tissue injury sacrum, toes Wound care  Best practice (evaluated daily)  Diet: tube feeding Pain/Anxiety/Delirium protocol (if indicated): no PAD, minimize sedation VAP protocol (if indicated): yes DVT prophylaxis: Sub q heparin GI prophylaxis: Pantoprazole for stress ulcer prophylaxis Glucose control: monitor Mobility: PT working with patient Disposition: Transfer to step down unit. Long term will require SNF. Uninsured and not  eligible for LTACH   Goals of Care:  Last date of multidisciplinary goals of care discussion: 11/27/20 Family and staff present: girlfriend Summary of discussion: full code, hoping for functional recovery. Updated girlfriend that plan is for vent liberation. not eligible for ltach Follow up goals of care discussion due: 12/04/20 Code Status: full  Labs   CBC: Recent Labs  Lab 11/27/20 0138  WBC 7.2  HGB 7.9*  HCT 24.3*  MCV 101.7*  PLT 409    Basic Metabolic Panel: Recent Labs  Lab 11/24/20 0342 11/25/20 0135 11/26/20 0147 11/27/20 0138 11/28/20 0146  NA 144 141 137 140 145  K 4.2 4.1 4.4 4.4 4.3  CL 111 109 106 108 114*  CO2 19* 18* 17* 18* 19*  GLUCOSE 132* 139* 139* 140* 137*  BUN 117* 113* 109* 100* 90*  CREATININE 2.86* 2.30* 1.99* 1.78* 1.60*  CALCIUM 9.1 9.1 9.0 9.2 9.2  MG  --   --   --  2.0  --   PHOS 6.3* 6.5* 6.3* 7.2* 5.7*   GFR: Estimated Creatinine Clearance: 65.5 mL/min (A) (by C-G formula based on SCr of 1.6 mg/dL (H)). Recent Labs  Lab 11/27/20 0138  WBC 7.2    Liver Function Tests: Recent Labs  Lab 11/24/20 0342 11/25/20 0135 11/26/20 0147 11/27/20 0138 11/28/20 0146  AST  --   --   --  26  --   ALT  --   --   --  29  --   ALKPHOS  --   --   --  46  --   BILITOT  --   --   --  0.6  --   PROT  --   --   --  5.9*  --   ALBUMIN 2.3* 2.2* 2.3* 2.3* 2.3*   No results for input(s): LIPASE, AMYLASE in the last 168 hours. No results for input(s): AMMONIA in the last 168 hours.  ABG    Component Value Date/Time   PHART 7.461 (H) 11/17/2020 1155   PCO2ART 35.7 11/17/2020 1155   PO2ART 70.8 (L) 11/17/2020 1155   HCO3 25.1 11/17/2020 1155   ACIDBASEDEF 1.3 11/10/2020 0625   O2SAT 93.8 11/17/2020 1155     Coagulation Profile: No results for input(s): INR, PROTIME in the last 168 hours.  Cardiac Enzymes: No results for input(s): CKTOTAL, CKMB, CKMBINDEX, TROPONINI in the last 168 hours.  HbA1C: Hgb A1c MFr Bld  Date/Time Value Ref  Range Status  10/22/2020 03:38 AM 5.3 4.8 - 5.6 % Final    Comment:    (NOTE)         Prediabetes: 5.7 - 6.4         Diabetes: >6.4         Glycemic control for adults with diabetes: <7.0     CBG: Recent Labs  Lab 11/29/20 1652 11/29/20 2122 11/29/20 2350 11/30/20 0433 11/30/20 0830  GLUCAP 117* 113* Dona Ana, MD [facility] Office: [phone]   See Amion for Pager Details

## 2020-12-01 DIAGNOSIS — J9600 Acute respiratory failure, unspecified whether with hypoxia or hypercapnia: Secondary | ICD-10-CM

## 2020-12-01 DIAGNOSIS — T17908D Unspecified foreign body in respiratory tract, part unspecified causing other injury, subsequent encounter: Secondary | ICD-10-CM

## 2020-12-01 DIAGNOSIS — J80 Acute respiratory distress syndrome: Secondary | ICD-10-CM

## 2020-12-01 DIAGNOSIS — U071 COVID-19: Secondary | ICD-10-CM

## 2020-12-01 LAB — CBC
HCT: 30.5 % — ABNORMAL LOW (ref 39.0–52.0)
Hemoglobin: 9.1 g/dL — ABNORMAL LOW (ref 13.0–17.0)
MCH: 32.5 pg (ref 26.0–34.0)
MCHC: 29.8 g/dL — ABNORMAL LOW (ref 30.0–36.0)
MCV: 108.9 fL — ABNORMAL HIGH (ref 80.0–100.0)
Platelets: 213 10*3/uL (ref 150–400)
RBC: 2.8 MIL/uL — ABNORMAL LOW (ref 4.22–5.81)
RDW: 15.6 % — ABNORMAL HIGH (ref 11.5–15.5)
WBC: 9 10*3/uL (ref 4.0–10.5)
nRBC: 0 % (ref 0.0–0.2)

## 2020-12-01 LAB — BASIC METABOLIC PANEL
Anion gap: 12 (ref 5–15)
BUN: 87 mg/dL — ABNORMAL HIGH (ref 6–20)
CO2: 20 mmol/L — ABNORMAL LOW (ref 22–32)
Calcium: 9.6 mg/dL (ref 8.9–10.3)
Chloride: 126 mmol/L — ABNORMAL HIGH (ref 98–111)
Creatinine, Ser: 1.45 mg/dL — ABNORMAL HIGH (ref 0.61–1.24)
GFR, Estimated: 56 mL/min — ABNORMAL LOW (ref >60)
Glucose, Bld: 126 mg/dL — ABNORMAL HIGH (ref 70–99)
Potassium: 4.7 mmol/L (ref 3.5–5.1)
Sodium: 158 mmol/L — ABNORMAL HIGH (ref 135–145)

## 2020-12-01 LAB — GLUCOSE, CAPILLARY
Glucose-Capillary: 127 mg/dL — ABNORMAL HIGH (ref 70–99)
Glucose-Capillary: 129 mg/dL — ABNORMAL HIGH (ref 70–99)
Glucose-Capillary: 136 mg/dL — ABNORMAL HIGH (ref 70–99)
Glucose-Capillary: 118 mg/dL — ABNORMAL HIGH (ref 70–99)
Glucose-Capillary: 120 mg/dL — ABNORMAL HIGH (ref 70–99)
Glucose-Capillary: 111 mg/dL — ABNORMAL HIGH (ref 70–99)

## 2020-12-01 MED ORDER — DEXTROSE 5 % IV SOLN: INTRAVENOUS | Status: DC

## 2020-12-01 MED ORDER — FREE WATER
400.0000 mL | Status: DC
  Administered 2020-12-01 – 2020-12-04 (×18): 400 mL

## 2020-12-01 NOTE — Progress Notes (Signed)
PROGRESS NOTE    Jackson Figueroa  WJX:[id number] DOB: [birth year] DOA: 10/20/2020 PCP: Patient, No Pcp Per  Brief Narrative: 60 year old male with no significant past medical history was admitted to [facility] with on 11/28 with severe hypoxic respiratory failure from Covid pneumonia, he tested positive on 11/26 PTA. -Intubated on arrival and transferred to [facility] long hospital 11/29 -Prolonged respiratory failure, eventually had a tracheostomy on 12/23 and PEG tube placed on 12/30 [facility] course also complicated by severe AKI due to ATN from septic shock and Covid, required CRRT from 12/16-12/26, subsequently started intermittent dialysis, last HD 12/28, HD catheter was removed on 1/4 -Transferred from PCCM to [facility] service today 1/9  Detailed course 11/28 Presented to [facility] ER, intubated for hypoxemia, empiric abx, shock on levo. COVID Rx 11/29 Persistent shock, central line placed, ARDS protocol continued 11/30 Pressor requirements continued, NMB initiated to facilitate ventilation tube feeds started, initiated proning 12/01 Stopping NMB, proning positioning protocol discontinued, free water for rising creatinine and sodium 12/02 PEEP trending down. Renal function improved. Changed to versed from prop gtt d/t rising triglycerides  12/03 Remdesivir completed. Peep/FIO2 stable. Free water adjusted. Solumedrol reduced, fentanyl to Dilaudid, adding Oxycodone 12/04 Dyssynchrony, given paralytic overnight, changed to ketamine; vomited / aspirated, restarted paralytics 12/06 Proned, under NMB 12/07 Proning, NMB continued. Switched antibiotics for stenotrophomonas 12/13 Stopped oxycodone, added dilaudid oral, continuous nimbex infusion to prn, palliative care consulted 12/14 Changed to pressure control 12/15 Worsening vent synchrony after clamping OG tube 12/18 back on paralytics for "guppy breathing/ air stacking" on PCV ? ,mucus plug  12/19 Back on PCV and off paralytics  12/20 having  intermittent fevers, atrial fibrillation with some ST changes. Still requiring breakthrough Dilaudid boluses for ventilator synchrony, still having Significant gastric residual, refluxing gastric content into mouth 12/21: Infectious disease consulted for stenotrophomonas multidrug-resistant in sputum, added systemic steroids. Started on minocycline per ID 12/22 Clonazepam added 12/23 trach placed. Versed off. Changed dilaudid from $RemoveBefor'4mg'yjCNFsbfJXll$ -.$Re'3mg'uyM$ , added clonidine and increased depakote. Added midodrine.  12/25 Dilaudid weaned to off, tolerated trach collar 12/26 tolerated weaning for about 4 hours on trach collar 12/30 G tube with IR 1/9: Tx to [facility] svc  Assessment & Plan:   Acute hypoxic and hypercarbic respiratory failure ARDS due to COVID-19 -Completed therapy for Covid including IV steroids, baricitinib, remdesivir -Underwent tracheostomy on 12/23, continue routine trach care -Stable on trach collar, O2 35%, was intermittently requiring nightly ventilation till 1/6 -SLP following for PMV and swallow assessments -Increased secretions, continue frequent suctioning, pulmonary toilet -Pulmonary will continue to follow for trach management  Acute metabolic encephalopathy -In the setting of prolonged mechanical ventilation and Covid pneumonia/respiratory failure -Improving -Minimize sedation -Low-dose Seroquel nightly -Dysphagia-continue tube feeds via PEG  Hypernatremia -Increased free water add D5 water infusion today  MDR stenotrophomonas pneumonia -Seen by ID this admission, started on minocycline 12/21 for 14-day course, has completed over 2 weeks of this -Has completed multiple additional antibiotic courses for aspiration pneumonia this admission while in ICU  Severe acute kidney injury ATN from Covid and shock -Resolved, required CRRT from 12/16-12/26, subsequently started intermittent dialysis, last HD 12/28, HD catheter was removed on 1/4  Critical illness neuropathy Severe  deconditioning -Patient has minimal motor strength in upper and lower extremities -Anticipate will need prolonged rehabilitation  Anemia due to acute illness -Monitor  Pressure injury sacrum, toes -Continue wound care  Moderate protein calorie malnutrition -Continue tube feeds via PEG tube   DVT prophylaxis: Subcutaneous heparin Code Status: Full code  Family Communication: No family at bedside, Disposition Plan:  Status is: Inpatient  Remains inpatient appropriate because:Inpatient level of care appropriate due to severity of illness   Dispo: The patient is from: Home              Anticipated d/c is to: LTAC              Anticipated d/c date is: > 3 days              Patient currently is not medically stable to d/c.  Consultants:   PCCM, nephrology, palliative medicine   Procedures: Tracheostomy 12/23  PEG tube 12/30  Antimicrobials:    Subjective: -Having thick frequent secretions requiring suctioning  Objective: Vitals:   12/01/20 0800 12/01/20 0832 12/01/20 0900 12/01/20 1000  BP: 133/87  (!) 124/96 (!) 128/96  Pulse: (!) 118 (!) 120 (!) 118 (!) 115  Resp: (!) 26 (!) 28 (!) 25 (!) 24  Temp: 99.2 F (37.3 C)     TempSrc: Oral     SpO2: 95% 96% 96% 93%  Weight:      Height:        Intake/Output Summary (Last 24 hours) at 12/01/2020 1022 Last data filed at 12/01/2020 1000 Gross per 24 hour  Intake 2295 ml  Output 2850 ml  Net -555 ml   Filed Weights   11/19/20 0500 11/21/20 0447 11/22/20 0500  Weight: 108.3 kg 108.5 kg 110.9 kg    Examination:  General exam: Chronically ill middle-aged male sitting up in bed, awake, somewhat alert, oriented to self and place HEENT: Tracheostomy with trach collar, thick yellow purulent secretions noted CVS: S1-S2, regular rate rhythm Lungs: Few scattered rhonchi and conducted upper airway sounds Abdomen: Soft, nontender, bowel sounds present Extremities: No edema Neuro: Profound upper and lower extremity  weakness Psychiatry: Flat affect    Data Reviewed:   CBC: Recent Labs  Lab 11/27/20 0138 12/01/20 0131  WBC 7.2 9.0  HGB 7.9* 9.1*  HCT 24.3* 30.5*  MCV 101.7* 108.9*  PLT 164 427   Basic Metabolic Panel: Recent Labs  Lab 11/25/20 0135 11/26/20 0147 11/27/20 0138 11/28/20 0146 12/01/20 0131  NA 141 137 140 145 158*  K 4.1 4.4 4.4 4.3 4.7  CL 109 106 108 114* 126*  CO2 18* 17* 18* 19* 20*  GLUCOSE 139* 139* 140* 137* 126*  BUN 113* 109* 100* 90* 87*  CREATININE 2.30* 1.99* 1.78* 1.60* 1.45*  CALCIUM 9.1 9.0 9.2 9.2 9.6  MG  --   --  2.0  --   --   PHOS 6.5* 6.3* 7.2* 5.7*  --    GFR: Estimated Creatinine Clearance: 72.3 mL/min (A) (by C-G formula based on SCr of 1.45 mg/dL (H)). Liver Function Tests: Recent Labs  Lab 11/25/20 0135 11/26/20 0147 11/27/20 0138 11/28/20 0146  AST  --   --  26  --   ALT  --   --  29  --   ALKPHOS  --   --  76  --   BILITOT  --   --  0.6  --   PROT  --   --  5.9*  --   ALBUMIN 2.2* 2.3* 2.3* 2.3*   No results for input(s): LIPASE, AMYLASE in the last 168 hours. No results for input(s): AMMONIA in the last 168 hours. Coagulation Profile: No results for input(s): INR, PROTIME in the last 168 hours. Cardiac Enzymes: No results for input(s): CKTOTAL, CKMB, CKMBINDEX, TROPONINI in the last  168 hours. BNP (last 3 results) No results for input(s): PROBNP in the last 8760 hours. HbA1C: No results for input(s): HGBA1C in the last 72 hours. CBG: Recent Labs  Lab 11/30/20 1539 11/30/20 1935 11/30/20 2323 12/01/20 0438 12/01/20 0757  GLUCAP 106* 125* 130* 127* 129*   Lipid Profile: No results for input(s): CHOL, HDL, LDLCALC, TRIG, CHOLHDL, LDLDIRECT in the last 72 hours. Thyroid Function Tests: No results for input(s): TSH, T4TOTAL, FREET4, T3FREE, THYROIDAB in the last 72 hours. Anemia Panel: No results for input(s): VITAMINB12, FOLATE, FERRITIN, TIBC, IRON, RETICCTPCT in the last 72 hours. Urine analysis:    Component  Value Date/Time   COLORURINE YELLOW 11/02/2020 1054   APPEARANCEUR CLOUDY (A) 11/02/2020 1054   LABSPEC 1.012 11/02/2020 1054   PHURINE 5.0 11/02/2020 1054   GLUCOSEU NEGATIVE 11/02/2020 1054   HGBUR LARGE (A) 11/02/2020 1054   BILIRUBINUR NEGATIVE 11/02/2020 1054   [place] 11/02/2020 1054   PROTEINUR 30 (A) 11/02/2020 1054   NITRITE NEGATIVE 11/02/2020 1054   LEUKOCYTESUR TRACE (A) 11/02/2020 1054   Sepsis Labs: $RemoveBefo'@LABRCNTIP'NLuAZdlBzxp$ (procalcitonin:4,lacticidven:4)  )No results found for this or any previous visit (from the past 240 hour(s)).       Radiology Studies: No results found.      Scheduled Meds: . albuterol  2.5 mg Nebulization QID  . chlorhexidine gluconate (MEDLINE KIT)  15 mL Mouth Rinse BID  . Chlorhexidine Gluconate Cloth  6 each Topical Q0600  . darbepoetin (ARANESP) injection - DIALYSIS  100 mcg Subcutaneous Q Tue-1800  . famotidine  20 mg Per Tube QHS  . feeding supplement (PROSource TF)  90 mL Per Tube TID  . free water  400 mL Per Tube Q4H  . heparin injection (subcutaneous)  5,000 Units Subcutaneous Q8H  . linagliptin  5 mg Per Tube Daily  . mouth rinse  15 mL Mouth Rinse 10 times per day  . minocycline  100 mg Per Tube Q12H  . QUEtiapine  50 mg Per Tube QHS  . sodium bicarbonate  650 mg Per Tube BID  . sodium chloride flush  10-40 mL Intracatheter Q12H   Continuous Infusions: . sodium chloride Stopped (11/24/20 1418)  . dextrose    . feeding supplement (VITAL 1.5 CAL) 1,000 mL (11/30/20 1637)     LOS: 42 days    Time spent: 43min Domenic Polite, MD [facility]  12/01/2020, 10:22 AM

## 2020-12-02 LAB — COMPREHENSIVE METABOLIC PANEL
ALT: 50 U/L — ABNORMAL HIGH (ref 0–44)
AST: 31 U/L (ref 15–41)
Albumin: 2.6 g/dL — ABNORMAL LOW (ref 3.5–5.0)
Alkaline Phosphatase: 83 U/L (ref 38–126)
Anion gap: 12 (ref 5–15)
BUN: 80 mg/dL — ABNORMAL HIGH (ref 6–20)
CO2: 20 mmol/L — ABNORMAL LOW (ref 22–32)
Calcium: 9.5 mg/dL (ref 8.9–10.3)
Chloride: 122 mmol/L — ABNORMAL HIGH (ref 98–111)
Creatinine, Ser: 1.43 mg/dL — ABNORMAL HIGH (ref 0.61–1.24)
GFR, Estimated: 57 mL/min — ABNORMAL LOW (ref >60)
Glucose, Bld: 152 mg/dL — ABNORMAL HIGH (ref 70–99)
Potassium: 4.5 mmol/L (ref 3.5–5.1)
Sodium: 154 mmol/L — ABNORMAL HIGH (ref 135–145)
Total Bilirubin: 0.3 mg/dL (ref 0.3–1.2)
Total Protein: 6.3 g/dL — ABNORMAL LOW (ref 6.5–8.1)

## 2020-12-02 LAB — CBC
HCT: 31.5 % — ABNORMAL LOW (ref 39.0–52.0)
Hemoglobin: 9.2 g/dL — ABNORMAL LOW (ref 13.0–17.0)
MCH: 31.9 pg (ref 26.0–34.0)
MCHC: 29.2 g/dL — ABNORMAL LOW (ref 30.0–36.0)
MCV: 109.4 fL — ABNORMAL HIGH (ref 80.0–100.0)
Platelets: 212 10*3/uL (ref 150–400)
RBC: 2.88 MIL/uL — ABNORMAL LOW (ref 4.22–5.81)
RDW: 15.1 % (ref 11.5–15.5)
WBC: 9.2 10*3/uL (ref 4.0–10.5)
nRBC: 0 % (ref 0.0–0.2)

## 2020-12-02 LAB — GLUCOSE, CAPILLARY
Glucose-Capillary: 117 mg/dL — ABNORMAL HIGH (ref 70–99)
Glucose-Capillary: 123 mg/dL — ABNORMAL HIGH (ref 70–99)
Glucose-Capillary: 125 mg/dL — ABNORMAL HIGH (ref 70–99)
Glucose-Capillary: 125 mg/dL — ABNORMAL HIGH (ref 70–99)
Glucose-Capillary: 130 mg/dL — ABNORMAL HIGH (ref 70–99)
Glucose-Capillary: 96 mg/dL (ref 70–99)

## 2020-12-02 MED ORDER — DEXTROSE 5 % IV SOLN: INTRAVENOUS | Status: AC

## 2020-12-02 MED ORDER — WHITE PETROLATUM EX OINT
TOPICAL_OINTMENT | CUTANEOUS | Status: AC
  Administered 2020-12-02: 0.2
  Filled 2020-12-02: qty 28.35

## 2020-12-02 MED ORDER — INFLUENZA VAC SPLIT QUAD 0.5 ML IM SUSY
0.5000 mL | PREFILLED_SYRINGE | INTRAMUSCULAR | Status: AC
  Administered 2020-12-05: 0.5 mL via INTRAMUSCULAR
  Filled 2020-12-02: qty 0.5

## 2020-12-02 NOTE — Consult Note (Signed)
[place] Nurse wound follow up Wound type:sacral DTI with MASD Measurement:area affected over the sacrum and bilateral buttocks; 2 areas of partial thickness skin one on the right buttock and one on the left buttock; right nare with crusted black area Wound bed: clean and pink over the buttocks; nare is healing Drainage (amount, consistency, odor) none Periwound: intact  Dressing procedure/placement/frequency: Continue zinc based moisture barrier cream to the buttocks; BID and PRN after each episode of incontinence DC betadine to the right nare; begin Vaseline to the nare daily.  [facility] Nurse team will follow with you and see patient within 10 days for wound assessments.  Please notify [facility] nurses of any acute changes in the wounds or any new areas of concern [place] MSN, [place], [place], [place]

## 2020-12-02 NOTE — Progress Notes (Signed)
Physical Therapy Treatment Patient Details Name: Jackson Figueroa MRN: [id number] DOB: Jul 07, 1961 Today's Date: 12/02/2020    History of Present Illness 60 yo admitted 11/28 with respiratory distress and unresponsive at home with Covid 19. Pt with AKI and HD dependent. CRRT 12/16-12/26. Trach 12/23. encephalopathy since admission. PEG 12/30.PMHx: obesity    PT Comments    Pt progressing with all mobility, strengthening and cognition. Pt with PMSV on throughout session and able to communicate, follow commands and demonstrate increased ability with bed mobility and sitting balance today. Encouraged continued chair positioning and scapular retraction with blanket roll throughout the day. Pt educated for need to continue to move extremities and to use his voice to ask for things. Goals updated.   Pt on trach collar throughout 35% Supine 135/89, 95%, HR 102 Tilt 15 degrees 109/82, HR 114 after 6 min 127/86 30 degrees total of 8 min with initial BP 152/132 and end 107/79, HR 119 45 degrees 4 min with HR 133, BP 124/76    Follow Up Recommendations  Supervision/Assistance - 24 hour;SNF     Equipment Recommendations  Hospital bed;Wheelchair (measurements PT);Wheelchair cushion (measurements PT)    Recommendations for Other Services       Precautions / Restrictions Precautions Precautions: Fall Precaution Comments: PEG, trach, flexiseal Restrictions Weight Bearing Restrictions: No    Mobility  Bed Mobility Overal bed mobility: Needs Assistance Bed Mobility: Rolling;Sidelying to Sit;Sit to Supine Rolling: Mod assist;+2 for physical assistance Sidelying to sit: Max assist;+2 for physical assistance   Sit to supine: Max assist;+2 for physical assistance   General bed mobility comments: pt able to assist with bending knees, looking for and reaching for rail to assist with rolling bil today. Side to sit with max assist to bring legs off of bed and elevate trunk. Max +2 to control trunk and  elevate legs to return to supine. Pt able to sit eOB grossly 5 min with periods of minguard up to 15 sec with cues for posture  Transfers Overall transfer level: Needs assistance   Transfers: Sit to/from Stand Sit to Stand: Total assist         General transfer comment: total assist with tilt feature to 45 degrees. Total tilt 18 min, 15 degrees 6 min, 30 degrees 8 min, 45 degrees 4 min  Ambulation/Gait             General Gait Details: unable   Stairs             Wheelchair Mobility    Modified Rankin (Stroke Patients Only)       Balance Overall balance assessment: Needs assistance   Sitting balance-Leahy Scale: Zero Sitting balance - Comments: EOB 5 min with max to minguard assist   Standing balance support: No upper extremity supported;During functional activity Standing balance-Leahy Scale: Zero Standing balance comment: tilt bed with strapping, tendency for left lean                            Cognition Arousal/Alertness: Awake/alert Behavior During Therapy: Flat affect Overall Cognitive Status: Impaired/Different from baseline Area of Impairment: Orientation;Memory;Following commands;Safety/judgement;Problem solving                 Orientation Level: Disoriented to;Time;Place             General Comments: Pt with PMSV throughout session stating date as February and "[place]" for location. Pt following 90% of commands today with decreased awareness of deficits and  impaired balance      Exercises General Exercises - Upper Extremity Shoulder Flexion: AAROM;Both;10 reps;Standing Elbow Flexion: Both;10 reps;AAROM;Standing Elbow Extension: AAROM;Both;10 reps;Standing Digit Composite Flexion: Both;AROM;10 reps;Standing General Exercises - Lower Extremity Heel Slides: AAROM;Both;10 reps;Supine Mini-Sqauts: AROM;Both;10 reps;Standing (10 reps at 30 degrees and 10 reps at 45 degrees)    General Comments        Pertinent  Vitals/Pain Pain Assessment: No/denies pain    Home Living                      Prior Function            PT Goals (current goals can now be found in the care plan section) Acute Rehab PT Goals Time For Goal Achievement: 12/16/20 Potential to Achieve Goals: Fair Additional Goals Additional Goal #1: Pt will follow single step commands 3/5 trials Progress towards PT goals: Progressing toward goals    Frequency    Min 3X/week      PT Plan Current plan remains appropriate    Co-evaluation              AM-PAC PT "6 Clicks" Mobility   Outcome Measure  Help needed turning from your back to your side while in a flat bed without using bedrails?: A Lot Help needed moving from lying on your back to sitting on the side of a flat bed without using bedrails?: A Lot Help needed moving to and from a bed to a chair (including a wheelchair)?: Total Help needed standing up from a chair using your arms (e.g., wheelchair or bedside chair)?: Total Help needed to walk in hospital room?: Total Help needed climbing 3-5 steps with a railing? : Total 6 Click Score: 8    End of Session   Activity Tolerance: Patient tolerated treatment well Patient left: in bed;with call bell/phone within reach (with blanket roll on spine to promote scapular retraction with RN aware for removal x 1 hr) Nurse Communication: Mobility status;Need for lift equipment PT Visit Diagnosis: Other abnormalities of gait and mobility (R26.89);Difficulty in walking, not elsewhere classified (R26.2);Other symptoms and signs involving the nervous system (R29.898);Muscle weakness (generalized) (M62.81)     Time: 0727-0825 PT Time Calculation (min) (ACUTE ONLY): 58 min  Charges:  $Therapeutic Exercise: 23-37 mins $Therapeutic Activity: 23-37 mins                     Delona Clasby P, PT Acute Rehabilitation Services Pager: [phone] Office: [place] [facility] [place] 12/02/2020, 10:04 AM

## 2020-12-02 NOTE — Progress Notes (Signed)
  Speech Language Pathology Treatment: Dysphagia;Passy Muir Speaking valve  Patient Details Name: Jackson Figueroa MRN: [id number] DOB: [birth year] Today's Date: 12/02/2020 Time: 0867-6195 SLP Time Calculation (min) (ACUTE ONLY): 40 min  Assessment / Plan / Recommendation Clinical Impression  Pt was seen at bedside to assess readiness for po intake and PMSV tolerance. RN reports pt is wearing PMSV all waking hours, and has tolerated it very well. Good strong voice quality and cough noted. Oral care was completed with suction, following which pt accepted trials of ice chips, nectar thick liquid, and puree textures. Wet voice quality was noted after each consistency, as well as delayed cough response, raising concern for airway compromise. Recommend continuing with PO trials with SLP until pt is ready for instrumental study. Girlfriend present during this session.   HPI HPI: 60 yo admitted 11/28 with respiratory distress and unresponsive at home with Covid 19. Pt with AKI and HD dependent. CRRT 12/16-12/26. Intubated 11/28 until trach 12/23. Encephalopathy since admission. PEG 12/30. PMHx: obesity      SLP Plan  Continue with current plan of care       Recommendations  Diet recommendations: NPO Medication Administration: Via alternative means      Patient may use Passy-Muir Speech Valve: During all waking hours (remove during sleep) PMSV Supervision: Intermittent MD: Please consider changing trach tube to : Smaller size;Cuffless         Oral Care Recommendations: Oral care QID Follow up Recommendations: Skilled Nursing facility SLP Visit Diagnosis: Dysphagia, unspecified (R13.10) Plan: Continue with current plan of care       [place]rier. Quentin Ore, [facility], [place] Speech Language Pathologist Office: [phone] Pager: [phone]  Shonna Chock 12/02/2020, 4:00 PM

## 2020-12-02 NOTE — Progress Notes (Signed)
PROGRESS NOTE    Jackson Figueroa  NWG:[id number] DOB: Mar 18, 1961 DOA: 10/20/2020 PCP: Patient, No Pcp Per  Brief Narrative: 60 year old male with no significant past medical history was admitted to [facility] with on 11/28 with severe hypoxic respiratory failure from Covid pneumonia, he tested positive on 11/26 PTA. -Intubated on arrival and transferred to [facility] long hospital 11/29 -Prolonged respiratory failure, eventually had a tracheostomy on 12/23 and PEG tube placed on 12/30 [facility] course also complicated by severe AKI due to ATN from septic shock and Covid, required CRRT from 12/16-12/26, subsequently started intermittent dialysis, last HD 12/28, HD catheter was removed on 1/4 -Transferred from PCCM to [facility] service today 1/9  Detailed course 11/28 Presented to [facility] ER, intubated for hypoxemia, empiric abx, shock on levo. COVID Rx 11/29 Persistent shock, central line placed, ARDS protocol continued 11/30 Pressor requirements continued, NMB initiated to facilitate ventilation tube feeds started, initiated proning 12/01 Stopping NMB, proning positioning protocol discontinued, free water for rising creatinine and sodium 12/02 PEEP trending down. Renal function improved. Changed to versed from prop gtt d/t rising triglycerides  12/03 Remdesivir completed. Peep/FIO2 stable. Free water adjusted. Solumedrol reduced, fentanyl to Dilaudid, adding Oxycodone 12/04 Dyssynchrony, given paralytic overnight, changed to ketamine; vomited / aspirated, restarted paralytics 12/06 Proned, under NMB 12/07 Proning, NMB continued. Switched antibiotics for stenotrophomonas 12/13 Stopped oxycodone, added dilaudid oral, continuous nimbex infusion to prn, palliative care consulted 12/14 Changed to pressure control 12/15 Worsening vent synchrony after clamping OG tube 12/18 back on paralytics for "guppy breathing/ air stacking" on PCV ? ,mucus plug  12/19 Back on PCV and off paralytics  12/20 having  intermittent fevers, atrial fibrillation with some ST changes. Still requiring breakthrough Dilaudid boluses for ventilator synchrony, still having Significant gastric residual, refluxing gastric content into mouth 12/21: Infectious disease consulted for stenotrophomonas multidrug-resistant in sputum, added systemic steroids. Started on minocycline per ID 12/22 Clonazepam added 12/23 trach placed. Versed off. Changed dilaudid from $RemoveBefor'4mg'DWgbhwEDeWmX$ -.$Re'3mg'Jtg$ , added clonidine and increased depakote. Added midodrine.  12/25 Dilaudid weaned to off, tolerated trach collar 12/26 tolerated weaning for about 4 hours on trach collar 12/30 G tube with IR 1/9: Tx to [facility] svc  Assessment & Plan:   Acute hypoxic and hypercarbic respiratory failure ARDS due to COVID-19 -Completed therapy for Covid including IV steroids, baricitinib, remdesivir -Underwent tracheostomy on 12/23, continue routine trach care -Stable on trach collar-O2 35%, off nightly ventilation since 1/6 -SLP following for PMV and swallow assessments -Increased secretions, continue frequent suctioning, pulmonary toilet -Pulmonary will continue to follow for trach management hi Mickel Baas  Acute metabolic encephalopathy -In the setting of prolonged mechanical ventilation and Covid pneumonia/respiratory failure -Improving -Minimize sedation -Low-dose Seroquel nightly -Dysphagia-continue tube feeds via PEG  Hypernatremia -Improving but persists, continue D5 water infusion, free water increased yesterday  MDR stenotrophomonas pneumonia -Seen by ID this admission, started on minocycline 12/21 for 14-day course, has completed over 2 weeks of this -Has completed multiple additional antibiotic courses for aspiration pneumonia this admission while in ICU -Will discuss with pharmacy team  Severe acute kidney injury ATN from Covid and shock -Resolved, required CRRT from 12/16-12/26, subsequently started intermittent dialysis, last HD 12/28, HD catheter was  removed on 1/4 -Creatinine improved and stable, 1.4 now  Critical illness neuropathy Severe deconditioning -Patient has minimal motor strength in upper and lower extremities -Anticipate will need prolonged rehabilitation  Anemia due to acute illness -Stable, monitor  Pressure injury sacrum, toes -Continue wound care  Moderate protein calorie malnutrition -Continue tube  feeds via PEG tube   DVT prophylaxis: Subcutaneous heparin Code Status: Full code Family Communication: No family at bedside, Disposition Plan:  Status is: Inpatient  Remains inpatient appropriate because:Inpatient level of care appropriate due to severity of illness   Dispo: The patient is from: Home              Anticipated d/c is to: LTAC              Anticipated d/c date is: > 3 days              Patient currently is not medically stable to d/c.  Consultants:   PCCM, nephrology, palliative medicine   Procedures: Tracheostomy 12/23  PEG tube 12/30  Antimicrobials:    Subjective: -Having thick frequent secretions requiring suctioning  Objective: Vitals:   12/02/20 0700 12/02/20 0738 12/02/20 0800 12/02/20 0852  BP: 126/81  107/79   Pulse: (!) 109  (!) 119   Resp: (!) 28  (!) 21   Temp:  97.7 F (36.5 C)    TempSrc:  Axillary    SpO2: 96%  94% 97%  Weight:      Height:        Intake/Output Summary (Last 24 hours) at 12/02/2020 1046 Last data filed at 12/02/2020 0300 Gross per 24 hour  Intake 3181.79 ml  Output 1825 ml  Net 1356.79 ml   Filed Weights   11/19/20 0500 11/21/20 0447 11/22/20 0500  Weight: 108.3 kg 108.5 kg 110.9 kg    Examination:  General exam: Chronically ill middle-aged male laying in bed, somnolent easily arousable, oriented to self and place HEENT: Tracheostomy with trach collar, thick yellow purulent secretions noted CVS: S1-S2, regular rate rhythm Lungs: Few scattered rhonchi and conducted upper airway sounds  Abdomen: Soft, nontender, bowel sounds  present Extremities: No edema Neuro: Significant upper and lower extremity weakness Psych: Flat affect   Data Reviewed:   CBC: Recent Labs  Lab 11/27/20 0138 12/01/20 0131 12/02/20 0625  WBC 7.2 9.0 9.2  HGB 7.9* 9.1* 9.2*  HCT 24.3* 30.5* 31.5*  MCV 101.7* 108.9* 109.4*  PLT 164 213 923   Basic Metabolic Panel: Recent Labs  Lab 11/26/20 0147 11/27/20 0138 11/28/20 0146 12/01/20 0131 12/02/20 0625  NA 137 140 145 158* 154*  K 4.4 4.4 4.3 4.7 4.5  CL 106 108 114* 126* 122*  CO2 17* 18* 19* 20* 20*  GLUCOSE 139* 140* 137* 126* 152*  BUN 109* 100* 90* 87* 80*  CREATININE 1.99* 1.78* 1.60* 1.45* 1.43*  CALCIUM 9.0 9.2 9.2 9.6 9.5  MG  --  2.0  --   --   --   PHOS 6.3* 7.2* 5.7*  --   --    GFR: Estimated Creatinine Clearance: 73.3 mL/min (A) (by C-G formula based on SCr of 1.43 mg/dL (H)). Liver Function Tests: Recent Labs  Lab 11/26/20 0147 11/27/20 0138 11/28/20 0146 12/02/20 0625  AST  --  26  --  31  ALT  --  29  --  50*  ALKPHOS  --  76  --  83  BILITOT  --  0.6  --  0.3  PROT  --  5.9*  --  6.3*  ALBUMIN 2.3* 2.3* 2.3* 2.6*   No results for input(s): LIPASE, AMYLASE in the last 168 hours. No results for input(s): AMMONIA in the last 168 hours. Coagulation Profile: No results for input(s): INR, PROTIME in the last 168 hours. Cardiac Enzymes: No results for input(s): CKTOTAL,  CKMB, CKMBINDEX, TROPONINI in the last 168 hours. BNP (last 3 results) No results for input(s): PROBNP in the last 8760 hours. HbA1C: No results for input(s): HGBA1C in the last 72 hours. CBG: Recent Labs  Lab 12/01/20 1540 12/01/20 1930 12/01/20 2318 12/02/20 0324 12/02/20 0735  GLUCAP 118* 120* 111* 125* 130*   Lipid Profile: No results for input(s): CHOL, HDL, LDLCALC, TRIG, CHOLHDL, LDLDIRECT in the last 72 hours. Thyroid Function Tests: No results for input(s): TSH, T4TOTAL, FREET4, T3FREE, THYROIDAB in the last 72 hours. Anemia Panel: No results for input(s):  VITAMINB12, FOLATE, FERRITIN, TIBC, IRON, RETICCTPCT in the last 72 hours. Urine analysis:    Component Value Date/Time   COLORURINE YELLOW 11/02/2020 1054   APPEARANCEUR CLOUDY (A) 11/02/2020 1054   LABSPEC 1.012 11/02/2020 1054   PHURINE 5.0 11/02/2020 1054   GLUCOSEU NEGATIVE 11/02/2020 1054   HGBUR LARGE (A) 11/02/2020 1054   BILIRUBINUR NEGATIVE 11/02/2020 1054   [place] 11/02/2020 1054   PROTEINUR 30 (A) 11/02/2020 1054   NITRITE NEGATIVE 11/02/2020 1054   LEUKOCYTESUR TRACE (A) 11/02/2020 1054   Sepsis Labs: $RemoveBefo'@LABRCNTIP'gKunnTLMhyr$ (procalcitonin:4,lacticidven:4)  )No results found for this or any previous visit (from the past 240 hour(s)).   Scheduled Meds: . albuterol  2.5 mg Nebulization QID  . chlorhexidine gluconate (MEDLINE KIT)  15 mL Mouth Rinse BID  . Chlorhexidine Gluconate Cloth  6 each Topical Q0600  . darbepoetin (ARANESP) injection - DIALYSIS  100 mcg Subcutaneous Q Tue-1800  . famotidine  20 mg Per Tube QHS  . feeding supplement (PROSource TF)  90 mL Per Tube TID  . free water  400 mL Per Tube Q4H  . heparin injection (subcutaneous)  5,000 Units Subcutaneous Q8H  . linagliptin  5 mg Per Tube Daily  . mouth rinse  15 mL Mouth Rinse 10 times per day  . minocycline  100 mg Per Tube Q12H  . QUEtiapine  50 mg Per Tube QHS  . sodium bicarbonate  650 mg Per Tube BID  . sodium chloride flush  10-40 mL Intracatheter Q12H   Continuous Infusions: . sodium chloride Stopped (11/24/20 1418)  . dextrose 50 mL/hr at 12/02/20 0900  . feeding supplement (VITAL 1.5 CAL) 1,000 mL (12/02/20 0443)     LOS: 43 days    Time spent: 73min Domenic Polite, MD [facility]  12/02/2020, 10:46 AM

## 2020-12-03 LAB — CBC
HCT: 28 % — ABNORMAL LOW (ref 39.0–52.0)
Hemoglobin: 9 g/dL — ABNORMAL LOW (ref 13.0–17.0)
MCH: 33.7 pg (ref 26.0–34.0)
MCHC: 32.1 g/dL (ref 30.0–36.0)
MCV: 104.9 fL — ABNORMAL HIGH (ref 80.0–100.0)
Platelets: 213 10*3/uL (ref 150–400)
RBC: 2.67 MIL/uL — ABNORMAL LOW (ref 4.22–5.81)
RDW: 15.1 % (ref 11.5–15.5)
WBC: 8.3 10*3/uL (ref 4.0–10.5)
nRBC: 0.2 % (ref 0.0–0.2)

## 2020-12-03 LAB — BASIC METABOLIC PANEL
Anion gap: 9 (ref 5–15)
BUN: 81 mg/dL — ABNORMAL HIGH (ref 6–20)
CO2: 20 mmol/L — ABNORMAL LOW (ref 22–32)
Calcium: 9.2 mg/dL (ref 8.9–10.3)
Chloride: 121 mmol/L — ABNORMAL HIGH (ref 98–111)
Creatinine, Ser: 1.32 mg/dL — ABNORMAL HIGH (ref 0.61–1.24)
GFR, Estimated: >60 mL/min (ref >60)
Glucose, Bld: 132 mg/dL — ABNORMAL HIGH (ref 70–99)
Potassium: 4.5 mmol/L (ref 3.5–5.1)
Sodium: 150 mmol/L — ABNORMAL HIGH (ref 135–145)

## 2020-12-03 LAB — GLUCOSE, CAPILLARY
Glucose-Capillary: 122 mg/dL — ABNORMAL HIGH (ref 70–99)
Glucose-Capillary: 122 mg/dL — ABNORMAL HIGH (ref 70–99)
Glucose-Capillary: 110 mg/dL — ABNORMAL HIGH (ref 70–99)
Glucose-Capillary: 114 mg/dL — ABNORMAL HIGH (ref 70–99)
Glucose-Capillary: 99 mg/dL (ref 70–99)
Glucose-Capillary: 107 mg/dL — ABNORMAL HIGH (ref 70–99)

## 2020-12-03 NOTE — Progress Notes (Signed)
Physical Therapy Treatment Patient Details Name: Jackson Figueroa MRN: [id number] DOB: 1961/03/11 Today's Date: 12/03/2020    History of Present Illness 60 yo admitted 11/28 with respiratory distress and unresponsive at home with Covid 19. Pt with AKI and HD dependent. CRRT 12/16-12/26. Trach 12/23. encephalopathy since admission. PEG 12/30.PMHx: obesity    PT Comments    Pt pleasant with improved cognition, strength and ability to tolerate increased standing. Pt able to tilt to 48 degrees and activate quads as well as with SLR today. Encouraged continued bil UE and LE strengthening and towel roll placed end of session to promote scapular retraction with RN aware.   HR 104, SpO2 98% on trach collar 40% and BP 129/86 supine Tilt 15 degrees 117/89, 5 min 30 degrees 126/90, 6 min 48 degrees 114/94, 3 min with HR 130   Follow Up Recommendations  Supervision/Assistance - 24 hour;SNF     Equipment Recommendations  Hospital bed;Wheelchair (measurements PT);Wheelchair cushion (measurements PT)    Recommendations for Other Services       Precautions / Restrictions Precautions Precautions: Fall Precaution Comments: PEG, trach, flexiseal    Mobility  Bed Mobility Overal bed mobility: Needs Assistance Bed Mobility: Rolling Rolling: Mod assist         General bed mobility comments: pt able to roll to right with mod assist, max assist to roll left due to lack of right grip strength to grasp rail  Transfers                 General transfer comment: total assist with tilt feature to 48 degrees  Ambulation/Gait                 Stairs             Wheelchair Mobility    Modified Rankin (Stroke Patients Only)       Balance Overall balance assessment: Needs assistance           Standing balance-Leahy Scale: Zero Standing balance comment: tilt bed with strapping, tendency for left lean                            Cognition Arousal/Alertness:  Awake/alert Behavior During Therapy: Flat affect Overall Cognitive Status: Impaired/Different from baseline Area of Impairment: Orientation;Memory;Following commands;Safety/judgement;Problem solving                 Orientation Level: Disoriented to;Place Current Attention Level: Sustained Memory: Decreased short-term memory Following Commands: Follows one step commands with increased time     Problem Solving: Slow processing General Comments: pt oriented to month, hospital and situation but asking if he is moving rooms now      Exercises General Exercises - Upper Extremity Shoulder Flexion: AAROM;Both;10 reps;Standing Elbow Flexion: Both;10 reps;AAROM;Standing Elbow Extension: AAROM;Both;10 reps;Standing General Exercises - Lower Extremity Heel Slides: AAROM;Both;Supine;15 reps Hip ABduction/ADduction: AAROM;Both;Supine;10 reps Straight Leg Raises: AAROM;Both;Supine;5 reps Mini-Sqauts: AROM;Both;10 reps;Standing (35 degrees and 48 degrees 10 reps at each angle)    General Comments        Pertinent Vitals/Pain Pain Assessment: No/denies pain    Home Living                      Prior Function            PT Goals (current goals can now be found in the care plan section) Progress towards PT goals: Progressing toward goals    Frequency    Min  3X/week      PT Plan Current plan remains appropriate    Co-evaluation              AM-PAC PT "6 Clicks" Mobility   Outcome Measure  Help needed turning from your back to your side while in a flat bed without using bedrails?: A Lot Help needed moving from lying on your back to sitting on the side of a flat bed without using bedrails?: A Lot Help needed moving to and from a bed to a chair (including a wheelchair)?: Total Help needed standing up from a chair using your arms (e.g., wheelchair or bedside chair)?: Total Help needed to walk in hospital room?: Total Help needed climbing 3-5 steps with a  railing? : Total 6 Click Score: 8    End of Session   Activity Tolerance: Patient tolerated treatment well Patient left: in bed;with call bell/phone within reach Nurse Communication: Mobility status;Need for lift equipment PT Visit Diagnosis: Other abnormalities of gait and mobility (R26.89);Difficulty in walking, not elsewhere classified (R26.2);Other symptoms and signs involving the nervous system (R29.898);Muscle weakness (generalized) (M62.81)     Time: 8299-3716 PT Time Calculation (min) (ACUTE ONLY): 37 min  Charges:  $Therapeutic Exercise: 8-22 mins $Therapeutic Activity: 8-22 mins                     Amen Dargis P, PT Acute Rehabilitation Services Pager: [phone] Office: [facility] Daijanae Rafalski 12/03/2020, 1:37 PM

## 2020-12-03 NOTE — Progress Notes (Signed)
PROGRESS NOTE    Jackson Figueroa  OIN:[id number] DOB: [birth year] DOA: 10/20/2020 PCP: Patient, No Pcp Per  Brief Narrative: 60 year old male with no significant past medical history was admitted to [facility] with on 11/28 with severe hypoxic respiratory failure, ARDS/Covid pneumonia, he tested positive on 11/26 PTA. -Intubated on arrival and transferred to [facility] long hospital 11/29 -Prolonged respiratory failure, eventually had a tracheostomy on 12/23 and PEG tube placed on 12/30 [facility] course also complicated by severe AKI due to ATN from septic shock and Covid, required CRRT from 12/16-12/26, subsequently started intermittent dialysis, last HD 12/28, HD catheter was removed on 1/4. 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, started on minocycline 12/21 -Transferred from PCCM to [facility] service  1/9  Assessment & Plan:   Acute hypoxic and hypercarbic respiratory failure ARDS due to COVID-19 -Completed therapy for Covid including IV steroids, baricitinib, remdesivir -Underwent tracheostomy on 12/23, continue routine trach care -Stable on trach collar-O2 35%, off nightly ventilation since 1/6 -SLP following for PMV and swallow assessments -Increased secretions, continue frequent suctioning, pulmonary toilet -Pulmonary will continue to follow for trach management   Acute metabolic encephalopathy -In the setting of prolonged mechanical ventilation and Covid pneumonia/respiratory failure -Improving -Minimize sedation -Low-dose Seroquel nightly -Dysphagia-continue tube feeds via PEG  Hypernatremia -Improving, continue D5 water for few more hours today, free water increased, BMP in a.m.  MDR stenotrophomonas pneumonia -Seen by ID this admission, started on minocycline 12/21 this was then stopped on 12/28, repeat Cx w/ stenotrophomonas again, restarted on minocycline for 2 week course on 12/31, to complete course 1/31 -Has completed multiple  additional antibiotic courses for aspiration pneumonia this admission while in ICU -stable now, continue pulm toilet  Severe acute kidney injury ATN from Covid and shock -Resolved, required CRRT from 12/16-12/26, subsequently started intermittent dialysis, last HD 12/28, HD catheter was removed on 1/4 -Creatinine improved and stable, 1.4 now  Critical illness neuropathy Severe deconditioning -Patient has minimal motor strength in upper and lower extremities -Anticipate will need prolonged rehabilitation  Anemia due to acute illness -Stable, monitor  Pressure injury sacrum, toes -Continue wound care  Moderate protein calorie malnutrition -Continue tube feeds via PEG tube -SLP following  DVT prophylaxis: Subcutaneous heparin Code Status: Full code Family Communication: No family at bedside, Disposition Plan:  Status is: Inpatient  Remains inpatient appropriate because:Inpatient level of care appropriate due to severity of illness   Dispo: The patient is from: Home              Anticipated d/c is to: will need SNF that manages trach pts              Anticipated d/c date is: > 3 days              Patient currently is not medically stable to d/c.  Consultants:   PCCM, nephrology, palliative medicine   Procedures: Tracheostomy 12/23  PEG tube 12/30  Antimicrobials:    Subjective: -Okay today, no events overnight Objective: Vitals:   12/03/20 0700 12/03/20 0801 12/03/20 1113 12/03/20 1136  BP: 128/82     Pulse: (!) 105     Resp: (!) 26     Temp:  98.7 F (37.1 C) 98.9 F (37.2 C)   TempSrc:  Oral Oral   SpO2: 97%   98%  Weight:      Height:        Intake/Output Summary (Last 24 hours) at 12/03/2020 1221 Last data filed at 12/03/2020 [phone]  Gross per 24 hour  Intake 2679.82 ml  Output 2750 ml  Net -70.18 ml   Filed Weights   11/19/20 0500 11/21/20 0447 11/22/20 0500  Weight: 108.3 kg 108.5 kg 110.9 kg    Examination:  General exam: Chronically  middle-aged male laying in bed, awake alert oriented to self, place and partly to time, much more alert and responsive today HEENT: Trach collar with scant secretions CVS: S1-S2, regular rate rhythm Lungs: Few scattered rhonchi, conducted upper airway sounds Abdomen: Soft, nontender, bowel sounds present Extremities: No edema Skin: Pressure wounds and scabs on both feet Neuro: Significant upper and lower extremity weakness Psych: Flat affect   Data Reviewed:   CBC: Recent Labs  Lab 11/27/20 0138 12/01/20 0131 12/02/20 0625 12/03/20 0217  WBC 7.2 9.0 9.2 8.3  HGB 7.9* 9.1* 9.2* 9.0*  HCT 24.3* 30.5* 31.5* 28.0*  MCV 101.7* 108.9* 109.4* 104.9*  PLT 164 213 212 007   Basic Metabolic Panel: Recent Labs  Lab 11/27/20 0138 11/28/20 0146 12/01/20 0131 12/02/20 0625 12/03/20 0217  NA 140 145 158* 154* 150*  K 4.4 4.3 4.7 4.5 4.5  CL 108 114* 126* 122* 121*  CO2 18* 19* 20* 20* 20*  GLUCOSE 140* 137* 126* 152* 132*  BUN 100* 90* 87* 80* 81*  CREATININE 1.78* 1.60* 1.45* 1.43* 1.32*  CALCIUM 9.2 9.2 9.6 9.5 9.2  MG 2.0  --   --   --   --   PHOS 7.2* 5.7*  --   --   --    GFR: Estimated Creatinine Clearance: 79.4 mL/min (A) (by C-G formula based on SCr of 1.32 mg/dL (H)). Liver Function Tests: Recent Labs  Lab 11/27/20 0138 11/28/20 0146 12/02/20 0625  AST 26  --  31  ALT 29  --  50*  ALKPHOS 76  --  83  BILITOT 0.6  --  0.3  PROT 5.9*  --  6.3*  ALBUMIN 2.3* 2.3* 2.6*   No results for input(s): LIPASE, AMYLASE in the last 168 hours. No results for input(s): AMMONIA in the last 168 hours. Coagulation Profile: No results for input(s): INR, PROTIME in the last 168 hours. Cardiac Enzymes: No results for input(s): CKTOTAL, CKMB, CKMBINDEX, TROPONINI in the last 168 hours. BNP (last 3 results) No results for input(s): PROBNP in the last 8760 hours. HbA1C: No results for input(s): HGBA1C in the last 72 hours. CBG: Recent Labs  Lab 12/02/20 1947 12/02/20 2342  12/03/20 0340 12/03/20 0758 12/03/20 1112  GLUCAP 96 117* 122* 122* 110*   Lipid Profile: No results for input(s): CHOL, HDL, LDLCALC, TRIG, CHOLHDL, LDLDIRECT in the last 72 hours. Thyroid Function Tests: No results for input(s): TSH, T4TOTAL, FREET4, T3FREE, THYROIDAB in the last 72 hours. Anemia Panel: No results for input(s): VITAMINB12, FOLATE, FERRITIN, TIBC, IRON, RETICCTPCT in the last 72 hours. Urine analysis:    Component Value Date/Time   COLORURINE YELLOW 11/02/2020 1054   APPEARANCEUR CLOUDY (A) 11/02/2020 1054   LABSPEC 1.012 11/02/2020 1054   PHURINE 5.0 11/02/2020 1054   GLUCOSEU NEGATIVE 11/02/2020 1054   HGBUR LARGE (A) 11/02/2020 1054   BILIRUBINUR NEGATIVE 11/02/2020 1054   [place] 11/02/2020 1054   PROTEINUR 30 (A) 11/02/2020 1054   NITRITE NEGATIVE 11/02/2020 1054   LEUKOCYTESUR TRACE (A) 11/02/2020 1054   Sepsis Labs: _0 (procalcitonin:4,lacticidven:4)  )No results found for this or any previous visit (from the past 240 hour(s)).   Scheduled Meds: . albuterol  2.5 mg Nebulization QID  . chlorhexidine  gluconate (MEDLINE KIT)  15 mL Mouth Rinse BID  . Chlorhexidine Gluconate Cloth  6 each Topical Q0600  . darbepoetin (ARANESP) injection - DIALYSIS  100 mcg Subcutaneous Q Tue-1800  . famotidine  20 mg Per Tube QHS  . feeding supplement (PROSource TF)  90 mL Per Tube TID  . free water  400 mL Per Tube Q4H  . heparin injection (subcutaneous)  5,000 Units Subcutaneous Q8H  . influenza vac split quadrivalent PF  0.5 mL Intramuscular Tomorrow-1000  . linagliptin  5 mg Per Tube Daily  . mouth rinse  15 mL Mouth Rinse 10 times per day  . minocycline  100 mg Per Tube Q12H  . QUEtiapine  50 mg Per Tube QHS  . sodium bicarbonate  650 mg Per Tube BID  . sodium chloride flush  10-40 mL Intracatheter Q12H   Continuous Infusions: . sodium chloride Stopped (11/24/20 1418)  . feeding supplement (VITAL 1.5 CAL) 1,000 mL (12/02/20 2352)      LOS: 44 days    Time spent: 80mn PDomenic Polite MD [facility]  12/03/2020, 12:21 PM

## 2020-12-04 LAB — BASIC METABOLIC PANEL
Anion gap: 11 (ref 5–15)
BUN: 78 mg/dL — ABNORMAL HIGH (ref 6–20)
CO2: 22 mmol/L (ref 22–32)
Calcium: 9.2 mg/dL (ref 8.9–10.3)
Chloride: 118 mmol/L — ABNORMAL HIGH (ref 98–111)
Creatinine, Ser: 1.19 mg/dL (ref 0.61–1.24)
GFR, Estimated: >60 mL/min (ref >60)
Glucose, Bld: 124 mg/dL — ABNORMAL HIGH (ref 70–99)
Potassium: 4.7 mmol/L (ref 3.5–5.1)
Sodium: 151 mmol/L — ABNORMAL HIGH (ref 135–145)

## 2020-12-04 LAB — GLUCOSE, CAPILLARY
Glucose-Capillary: 108 mg/dL — ABNORMAL HIGH (ref 70–99)
Glucose-Capillary: 102 mg/dL — ABNORMAL HIGH (ref 70–99)
Glucose-Capillary: 107 mg/dL — ABNORMAL HIGH (ref 70–99)
Glucose-Capillary: 103 mg/dL — ABNORMAL HIGH (ref 70–99)
Glucose-Capillary: 103 mg/dL — ABNORMAL HIGH (ref 70–99)
Glucose-Capillary: 114 mg/dL — ABNORMAL HIGH (ref 70–99)

## 2020-12-04 MED ORDER — FREE WATER
500.0000 mL | Status: DC
  Administered 2020-12-04 – 2020-12-14 (×50): 500 mL

## 2020-12-04 MED ORDER — DEXTROSE 5 % IV SOLN: INTRAVENOUS | Status: AC

## 2020-12-04 MED ORDER — LOPERAMIDE HCL 1 MG/7.5ML PO SUSP
1.0000 mg | Freq: Every day | ORAL | Status: AC
  Administered 2020-12-04 – 2020-12-05 (×2): 1 mg
  Filled 2020-12-04 (×2): qty 7.5

## 2020-12-04 MED ORDER — ALBUTEROL SULFATE (2.5 MG/3ML) 0.083% IN NEBU
2.5000 mg | INHALATION_SOLUTION | Freq: Four times a day (QID) | RESPIRATORY_TRACT | Status: DC | PRN
  Administered 2020-12-06: 2.5 mg via RESPIRATORY_TRACT
  Filled 2020-12-04: qty 3

## 2020-12-04 MED ORDER — LOPERAMIDE HCL 1 MG/7.5ML PO SUSP: 1.0000 mg | Freq: Every day | ORAL | Status: DC

## 2020-12-04 NOTE — NC FL2 (Signed)
[place] MEDICAID FL2 LEVEL OF CARE SCREENING TOOL     IDENTIFICATION  Patient Name: Jackson Figueroa Birthdate: Jun 21, 1961 Sex: male Admission Date (Current Location): 10/20/2020  [facility] and [place] Number:  [employer] and Address:  [facility], [place] [address], [place], [place] [address]      Provider Number: [id number]  Attending Physician Name and Address:  Domenic Polite, MD  Relative Name and Phone Number:  Neoma Laming, sister, [phone]    Current Level of Care: Hospital Recommended Level of Care: [facility] Prior Approval Number:    Date Approved/Denied:   PASRR Number: [id number]  Discharge Plan: SNF    Current Diagnoses: Patient Active Problem List   Diagnosis Date Noted  . Malnutrition of moderate degree 11/30/2020  . 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 Ileus ([place])   . Acute respiratory failure with hypoxemia ([place])   . AKI (acute kidney injury) ([place]) 11/11/2020  . Protein calorie malnutrition ([place]) 11/11/2020  . Acute metabolic encephalopathy 94/80/1655  . Acute respiratory distress syndrome (ARDS) due to COVID-19 virus ([place])   . Septic shock ([place])   . Acute respiratory failure due to COVID-19 [facility]) 10/20/2020    Orientation RESPIRATION BLADDER Height & Weight     Self,Time,Place  Tracheostomy (Placed 11/14/20, 28% FiO2) Incontinent,External catheter Weight: 244 lb 7.8 oz (110.9 kg) Height:  6' (182.9 cm)  BEHAVIORAL SYMPTOMS/MOOD NEUROLOGICAL BOWEL NUTRITION STATUS      Incontinent (rectal tube) Feeding tube (feeding supplement (VITAL 1.5 CAL) liquid 1,000 mL)  AMBULATORY STATUS COMMUNICATION OF NEEDS Skin   Extensive Assist Verbally (trach) PU Stage and Appropriate Care                       Personal Care Assistance Level of Assistance  Bathing,Feeding,Dressing Bathing Assistance: Maximum assistance Feeding assistance: Limited assistance Dressing Assistance: Limited  assistance     Functional Limitations Info  Speech     Speech Info: Impaired Systems developer)    SPECIAL CARE FACTORS FREQUENCY  PT (By licensed PT),OT (By licensed OT)     PT Frequency: 5x/week OT Frequency: 5x/week            Contractures Contractures Info: Not present    Additional Factors Info  Code Status,Allergies,Psychotropic,Isolation Precautions Code Status Info: Full Allergies Info: NKA Psychotropic Info: Seroquel   Isolation Precautions Info: OMDRO     Current Medications (12/04/2020):  This is the current hospital active medication list Current Facility-Administered Medications  Medication Dose Route Frequency Provider Last Rate Last Admin  . 0.9 %  sodium chloride infusion   Intravenous PRN Candee Furbish, MD   Stopped at 11/24/20 1418  . acetaminophen (TYLENOL) 160 MG/5ML solution 650 mg  650 mg Per Tube Q6H PRN Freda Jackson B, MD      . albuterol (PROVENTIL) (2.5 MG/3ML) 0.083% nebulizer solution 2.5 mg  2.5 mg Nebulization QID Melida Quitter, MD   2.5 mg at 12/04/20 0814  . chlorhexidine gluconate (MEDLINE KIT) (PERIDEX) 0.12 % solution 15 mL  15 mL Mouth Rinse BID Tanda Rockers, MD   15 mL at 12/04/20 0815  . Chlorhexidine Gluconate Cloth 2 % PADS 6 each  6 each Topical Q0600 Melida Quitter, MD   6 each at 12/03/20 2200  . Darbepoetin Alfa (ARANESP) injection 100 mcg  100 mcg Subcutaneous Q Tue-1800 Spero Geralds, MD   100 mcg at 12/03/20 1757  . famotidine (PEPCID) 40 MG/5ML suspension  20 mg  20 mg Per Tube QHS Smith, Daniel C, MD   20 mg at 12/03/20 2129  . feeding supplement (PROSource TF) liquid 90 mL  90 mL Per Tube TID Smith, Daniel C, MD   90 mL at 12/04/20 0946  . feeding supplement (VITAL 1.5 CAL) liquid 1,000 mL  1,000 mL Per Tube Continuous Smith, Daniel C, MD 65 mL/hr at 12/04/20 1007 1,000 mL at 12/04/20 1007  . free water 500 mL  500 mL Per Tube Q4H Joseph, Preetha, MD   500 mL at 12/04/20 0830  . heparin injection 5,000 Units  5,000 Units  Subcutaneous Q8H Bitonti, Michael T, RPH   5,000 Units at 12/04/20 0507  . HYDROmorphone (DILAUDID) injection 1 mg  1 mg Intravenous Q3H PRN Alva, Rakesh V, MD   1 mg at 11/26/20 2348  . influenza vac split quadrivalent PF (FLUARIX) injection 0.5 mL  0.5 mL Intramuscular Tomorrow-1000 Joseph, Preetha, MD      . iohexol (OMNIPAQUE) 300 MG/ML solution 50 mL  50 mL Per Tube Once PRN Edmunds, J, MD      . linagliptin (TRADJENTA) tablet 5 mg  5 mg Per Tube Daily Bates, Dwight, MD   5 mg at 12/04/20 0948  . lip balm (CARMEX) ointment 1 application  1 application Topical PRN Bates, Dwight, MD   1 application at 11/19/20 1539  . liver oil-zinc oxide (DESITIN) 40 % ointment   Topical PRN Olalere, Adewale A, MD   Given at 11/19/20 1833  . MEDLINE mouth rinse  15 mL Mouth Rinse 10 times per day Wert, Michael B, MD   15 mL at 12/04/20 0948  . minocycline (MINOCIN) capsule 100 mg  100 mg Per Tube Q12H Smith, Daniel C, MD   100 mg at 12/04/20 0948  . QUEtiapine (SEROQUEL) tablet 50 mg  50 mg Per Tube QHS Dewald, Jonathan B, MD   50 mg at 12/03/20 2129  . sodium bicarbonate tablet 650 mg  650 mg Per Tube BID Dewald, Jonathan B, MD   650 mg at 12/04/20 0948  . sodium chloride flush (NS) 0.9 % injection 10-40 mL  10-40 mL Intracatheter Q12H Bates, Dwight, MD   10 mL at 12/04/20 0949  . sodium chloride flush (NS) 0.9 % injection 10-40 mL  10-40 mL Intracatheter PRN Bates, Dwight, MD   20 mL at 11/15/20 0544  . Thrombi-Pad 3"X3" pad 1 each  1 each Topical PRN Bates, Dwight, MD         Discharge Medications: Please see discharge summary for a list of discharge medications.  Relevant Imaging Results:  Relevant Lab Results:   Additional Information SSN: [id number]. Unvaccinated (had covid on 10/20/20)   S , LCSW    

## 2020-12-04 NOTE — Progress Notes (Signed)
Spoke with family member Engineer, building services), who was very concerned about patient remaining in ICU rather than being transferred to Rehab. Patient is extremely weak, which family member is aware of. Family requests that MD and Case Management call. States she has not heard from anyone in days. She is not next of kin, but next of kin is in [place] and [name] states that she in not helpful in working on bettering the care for the patient.

## 2020-12-04 NOTE — TOC Progression Note (Signed)
Transition of Care [facility]) - Progression Note    Patient Details  Name: Jackson Figueroa MRN: [id number] Date of Birth: 23-Nov-1961  Transition of Care [facility]) CM/SW Contact  Carles Collet, RN Phone Number: 12/04/2020, 3:06 PM  Clinical Narrative:    Coverage listed as BCBS, which opens up potential for LTAC placement. Case referred to [facility] who will review for medical eligibility. CSW following for potential SNF placement as well, for facility that will accept TC.    Expected Discharge Plan: [place] Barriers to Discharge: Continued Medical Work up  Expected Discharge Plan and Services Expected Discharge Plan: [place]   Discharge Planning Services: CM Consult   Living arrangements for the past 2 months: Single Family Home                                       Social Determinants of Health (SDOH) Interventions    Readmission Risk Interventions No flowsheet data found.

## 2020-12-04 NOTE — Progress Notes (Signed)
Occupational Therapy Treatment Patient Details Name: Jackson Figueroa MRN: [id number] DOB: Jul 18, 1961 Today's Date: 12/04/2020    History of present illness 60 yo admitted 11/28 with respiratory distress and unresponsive at home with Covid 19. Pt with AKI and HD dependent. CRRT 12/16-12/26. Trach 12/23. encephalopathy since admission. PEG 12/30.PMHx: obesity   OT comments  Pt making steady progress towards OT goals this session. Pt continues to present with gross deconditioning and decreased activity tolerance. Session focus on sitting balance and bed mobility as precursor to higher level functional mobility tasks. Pt currently requires MAX - total A +2 for bed mobility and MAX - MIN A for sitting balance EOB. Pt able to complete therex as indicated below. Encouraged pt to work on AROM scapular protraction<>retraction with BUEs placed on bed rails. Pt initially reports dizziness when sitting EOB however symptoms resolved with BP from EOB 136/123.  Pt would continue to benefit from skilled occupational therapy while admitted and after d/c to address the below listed limitations in order to improve overall functional mobility and facilitate independence with BADL participation. DC plan remains appropriate, will follow acutely per POC.     Follow Up Recommendations  LTACH;SNF;Supervision/Assistance - 24 hour    Equipment Recommendations  Other (comment) (continue to assess)    Recommendations for Other Services      Precautions / Restrictions Precautions Precautions: Fall Precaution Comments: PEG, trach, flexiseal, condom cath Restrictions Weight Bearing Restrictions: No       Mobility Bed Mobility Overal bed mobility: Needs Assistance Bed Mobility: Rolling;Sidelying to Sit;Sit to Sidelying Rolling: Mod assist;Max assist Sidelying to sit: Max assist;+2 for physical assistance;HOB elevated     Sit to sidelying: +2 for physical assistance;Total assist;HOB elevated General bed mobility  comments: pt able to roll to R with mod assist, max assist to roll left due to lack of right grip strength to grasp rail. MAX A +2 to transition from sidelying to sitting needing assist to elevate trunk with HOB elevated to 30* and assist to advance BLEs to EOB,total A +2 to return to sidelying  Transfers                 General transfer comment: NT    Balance Overall balance assessment: Needs assistance Sitting-balance support: Bilateral upper extremity supported;Feet supported Sitting balance-Leahy Scale: Poor Sitting balance - Comments: pt sat EOb ~ 8 mins with up to MAX A but progressing to MIN A at times. Pt able to lean laterally R<>L and shift trunk anteriorly and posteriorly and return to midline with MIN A and no LOB                                   ADL either performed or assessed with clinical judgement   ADL Overall ADL's : Needs assistance/impaired     Grooming: Minimal assistance;Sitting Grooming Details (indicate cue type and reason): MIN A to bring suction to mouth to manage secretions                   Toilet Transfer Details (indicate cue type and reason): unable to to transfer however pt requried MAX A to sit EOB         Functional mobility during ADLs: Maximal assistance;+2 for physical assistance (bed mobility only; supine<>EOB) General ADL Comments: pt continues to present with gross deconditioning needing MAX A +2 for bed mobility to transition EOB for seated therex  Vision       Perception     Praxis      Cognition Arousal/Alertness: Awake/alert Behavior During Therapy: WFL for tasks assessed/performed Overall Cognitive Status: Impaired/Different from baseline Area of Impairment: Following commands;Problem solving;Memory;Attention                   Current Attention Level: Focused Memory: Decreased short-term memory (unable to recall being NPO) Following Commands: Follows one step commands with increased  time     Problem Solving: Slow processing General Comments: pt making more efforts to verbalize during session stating needs and following commands with increased time,some difficulty mantain attention during session        Exercises Other Exercises Other Exercises: seated marches with Active assist x10 reps BLEs Other Exercises: LAQ from EOB x10 reps Other Exercises: lateral leans x5 each side with MIN A to return to mid line Other Exercises: scapular protraction<>retractions with hands on knees x10 reps   Shoulder Instructions       General Comments VSS,  pt on 28% trach collar, BP from EOB 136/123 after initial reports of dizziness    Pertinent Vitals/ Pain       Pain Assessment: Faces Faces Pain Scale: No hurt  Home Living                                          Prior Functioning/Environment              Frequency  Min 2X/week        Progress Toward Goals  OT Goals(current goals can now be found in the care plan section)  Progress towards OT goals: Progressing toward goals  Acute Rehab OT Goals Patient Stated Goal: Go home OT Goal Formulation: With patient/family Time For Goal Achievement: 12/06/20 Potential to Achieve Goals: Good  Plan Discharge plan remains appropriate;Frequency remains appropriate    Co-evaluation                 AM-PAC OT "6 Clicks" Daily Activity     Outcome Measure   Help from another person eating meals?: Total (NPO) Help from another person taking care of personal grooming?: Total Help from another person toileting, which includes using toliet, bedpan, or urinal?: Total Help from another person bathing (including washing, rinsing, drying)?: Total Help from another person to put on and taking off regular upper body clothing?: Total Help from another person to put on and taking off regular lower body clothing?: Total 6 Click Score: 6    End of Session Equipment Utilized During Treatment:  Oxygen;Other (comment) (trach collar 28%)  OT Visit Diagnosis: Other abnormalities of gait and mobility (R26.89);Unsteadiness on feet (R26.81);Muscle weakness (generalized) (M62.81)   Activity Tolerance Patient tolerated treatment well   Patient Left in bed;with call bell/phone within reach   Nurse Communication Mobility status;Other (comment) (condom cath needs to be applied)        Time: 1950-9326 OT Time Calculation (min): 49 min  Charges: OT General Charges $OT Visit: 1 Visit OT Treatments $Therapeutic Activity: 38-52 mins  Harley Alto., COTA/L Acute Rehabilitation Services [phone]    Precious Haws 12/04/2020, 4:22 PM

## 2020-12-04 NOTE — Progress Notes (Addendum)
PROGRESS NOTE    Quamel Fitzmaurice  ZOX:[id number] DOB: [birth year] DOA: 10/20/2020 PCP: Patient, No Pcp Per  Brief Narrative: 60 year old male with no significant past medical history was admitted to [facility] with on 11/28 with severe hypoxic respiratory failure, ARDS/Covid pneumonia, he tested positive on 11/26 PTA. -Intubated on arrival and transferred to [facility] 11/29 -Prolonged respiratory failure, eventually had a tracheostomy on 12/23 and PEG tube placed on 12/30 [facility] course also complicated by severe AKI due to ATN from septic shock and Covid, required CRRT from 12/16-12/26, subsequently started intermittent dialysis, last HD 12/28, HD catheter was removed on 1/4. 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, started on minocycline 12/21 -Transferred from PCCM to [facility] service  1/10  Assessment & Plan:   Acute hypoxic and hypercarbic respiratory failure ARDS due to COVID-19 -Completed therapy for Covid including IV steroids, baricitinib, remdesivir -Underwent tracheostomy on 12/23, continue routine trach care -Stable on trach collar-O2 35%, off nightly ventilation since 1/6 -SLP following for PMV and swallow assessments -Increased secretions, continue frequent suctioning, pulmonary toilet -Pulmonary will continue to follow for trach management  -Discharge planning, TOC following  Acute metabolic encephalopathy -In the setting of prolonged mechanical ventilation and Covid pneumonia/respiratory failure -Improving -Continue low-dose Seroquel nightly -Dysphagia-continue tube feeds via PEG  Hypernatremia -Secondary to diarrhea and increased insensible losses - increase free water, restart D5 water for 12 hours -BMP in a.m.  MDR stenotrophomonas pneumonia -Seen by ID this admission, started on minocycline 12/21 stopped on 12/28, repeat Cx w/ stenotrophomonas again, restarted on minocycline for 2 week course on 12/31, to  complete course 1/13 -Has completed multiple additional antibiotic courses for aspiration pneumonia this admission while in ICU -stable now, continue pulm toilet  Chronic diarrhea -Secondary to tube feeds, continue Flexi-Seal, add Imodium today -Clinically do not suspect infectious diarrhea at this time  Severe acute kidney injury ATN from Covid and shock -Resolved, required CRRT from 12/16-12/26, subsequently started intermittent dialysis, last HD 12/28, HD catheter was removed on 1/4 -Creatinine improved and stable, 1.4 now  Critical illness neuropathy Severe deconditioning -Patient has minimal motor strength in upper and lower extremities -Anticipate will need prolonged rehabilitation  Anemia due to acute illness -Stable, monitor  Pressure injury sacrum, toes -Continue wound care  Moderate protein calorie malnutrition -Continue tube feeds via PEG tube -SLP following  DVT prophylaxis: Subcutaneous heparin Code Status: Full code Family Communication: No family at bedside, Disposition Plan:  Status is: Inpatient  Remains inpatient appropriate because:Inpatient level of care appropriate due to severity of illness   Dispo: The patient is from: Home              Anticipated d/c is to: will need SNF that manages trach pts              Anticipated d/c date is: > 3 days              Patient currently is not medically stable to d/c.  Consultants:   PCCM, nephrology, palliative medicine   Procedures: Tracheostomy 12/23  PEG tube 12/30  Antimicrobials:    Subjective: -Feels okay overall, no events overnight, continues to have considerable amounts of diarrhea Objective: Vitals:   12/04/20 0453 12/04/20 0500 12/04/20 0600 12/04/20 0727  BP: 114/90 (!) 130/95 115/86   Pulse: 97 (!) 103 (!) 101   Resp: (!) 23 (!) 23 (!) 23   Temp:    98 F (36.7 C)  TempSrc:    Oral  SpO2: 100% 100% 100%   Weight:      Height:        Intake/Output Summary (Last 24 hours) at  12/04/2020 1115 Last data filed at 12/04/2020 0600 Gross per 24 hour  Intake 1375 ml  Output 1225 ml  Net 150 ml   Filed Weights   11/19/20 0500 11/21/20 0447 11/22/20 0500  Weight: 108.3 kg 108.5 kg 110.9 kg    Examination:  General exam: Chronically ill middle-aged male, laying in bed, awake alert oriented to self, place and partly to time, much more alert and interactive HEENT: Trach collar with scant secretions CVS: S1-S2, regular rate rhythm Lungs: Rare scattered rhonchi otherwise clear Abdomen: Soft, nontender, bowel sounds present Extremities: No edema Skin: Pressure wounds and scabs on both feet Neuro: Significant upper and lower extremity weakness Psych: Flat affect   Data Reviewed:   CBC: Recent Labs  Lab 12/01/20 0131 12/02/20 0625 12/03/20 0217  WBC 9.0 9.2 8.3  HGB 9.1* 9.2* 9.0*  HCT 30.5* 31.5* 28.0*  MCV 108.9* 109.4* 104.9*  PLT 213 212 641   Basic Metabolic Panel: Recent Labs  Lab 11/28/20 0146 12/01/20 0131 12/02/20 0625 12/03/20 0217 12/04/20 0601  NA 145 158* 154* 150* 151*  K 4.3 4.7 4.5 4.5 4.7  CL 114* 126* 122* 121* 118*  CO2 19* 20* 20* 20* 22  GLUCOSE 137* 126* 152* 132* 124*  BUN 90* 87* 80* 81* 78*  CREATININE 1.60* 1.45* 1.43* 1.32* 1.19  CALCIUM 9.2 9.6 9.5 9.2 9.2  PHOS 5.7*  --   --   --   --    GFR: Estimated Creatinine Clearance: 88.1 mL/min (by C-G formula based on SCr of 1.19 mg/dL). Liver Function Tests: Recent Labs  Lab 11/28/20 0146 12/02/20 0625  AST  --  31  ALT  --  50*  ALKPHOS  --  83  BILITOT  --  0.3  PROT  --  6.3*  ALBUMIN 2.3* 2.6*   No results for input(s): LIPASE, AMYLASE in the last 168 hours. No results for input(s): AMMONIA in the last 168 hours. Coagulation Profile: No results for input(s): INR, PROTIME in the last 168 hours. Cardiac Enzymes: No results for input(s): CKTOTAL, CKMB, CKMBINDEX, TROPONINI in the last 168 hours. BNP (last 3 results) No results for input(s): PROBNP in the last  8760 hours. HbA1C: No results for input(s): HGBA1C in the last 72 hours. CBG: Recent Labs  Lab 12/03/20 1516 12/03/20 1943 12/03/20 2325 12/04/20 0323 12/04/20 0716  GLUCAP 114* 99 107* 108* 102*   Lipid Profile: No results for input(s): CHOL, HDL, LDLCALC, TRIG, CHOLHDL, LDLDIRECT in the last 72 hours. Thyroid Function Tests: No results for input(s): TSH, T4TOTAL, FREET4, T3FREE, THYROIDAB in the last 72 hours. Anemia Panel: No results for input(s): VITAMINB12, FOLATE, FERRITIN, TIBC, IRON, RETICCTPCT in the last 72 hours. Urine analysis:    Component Value Date/Time   COLORURINE YELLOW 11/02/2020 1054   APPEARANCEUR CLOUDY (A) 11/02/2020 1054   LABSPEC 1.012 11/02/2020 1054   PHURINE 5.0 11/02/2020 1054   GLUCOSEU NEGATIVE 11/02/2020 1054   HGBUR LARGE (A) 11/02/2020 1054   BILIRUBINUR NEGATIVE 11/02/2020 1054   Pettibone 11/02/2020 1054   PROTEINUR 30 (A) 11/02/2020 1054   NITRITE NEGATIVE 11/02/2020 1054   LEUKOCYTESUR TRACE (A) 11/02/2020 1054   Sepsis Labs: $[email or web address]'[id number]$ (procalcitonin:4,lacticidven:4)  )No results found for this or any previous visit (from the past 240 hour(s)).   Scheduled Meds: . albuterol  2.5 mg Nebulization QID  .  chlorhexidine gluconate (MEDLINE KIT)  15 mL Mouth Rinse BID  . Chlorhexidine Gluconate Cloth  6 each Topical Q0600  . darbepoetin (ARANESP) injection - DIALYSIS  100 mcg Subcutaneous Q Tue-1800  . famotidine  20 mg Per Tube QHS  . feeding supplement (PROSource TF)  90 mL Per Tube TID  . free water  500 mL Per Tube Q4H  . heparin injection (subcutaneous)  5,000 Units Subcutaneous Q8H  . influenza vac split quadrivalent PF  0.5 mL Intramuscular Tomorrow-1000  . linagliptin  5 mg Per Tube Daily  . mouth rinse  15 mL Mouth Rinse 10 times per day  . minocycline  100 mg Per Tube Q12H  . QUEtiapine  50 mg Per Tube QHS  . sodium bicarbonate  650 mg Per Tube BID  . sodium chloride flush  10-40 mL Intracatheter Q12H    Continuous Infusions: . sodium chloride Stopped (11/24/20 1418)  . feeding supplement (VITAL 1.5 CAL) 1,000 mL (12/04/20 1007)     LOS: 45 days    Time spent: 70min Domenic Polite, MD [facility]  12/04/2020, 11:15 AM

## 2020-12-05 DIAGNOSIS — G9341 Metabolic encephalopathy: Secondary | ICD-10-CM

## 2020-12-05 LAB — BASIC METABOLIC PANEL
Anion gap: 10 (ref 5–15)
BUN: 69 mg/dL — ABNORMAL HIGH (ref 6–20)
CO2: 21 mmol/L — ABNORMAL LOW (ref 22–32)
Calcium: 9.1 mg/dL (ref 8.9–10.3)
Chloride: 116 mmol/L — ABNORMAL HIGH (ref 98–111)
Creatinine, Ser: 1.13 mg/dL (ref 0.61–1.24)
GFR, Estimated: >60 mL/min (ref >60)
Glucose, Bld: 109 mg/dL — ABNORMAL HIGH (ref 70–99)
Potassium: 4.6 mmol/L (ref 3.5–5.1)
Sodium: 147 mmol/L — ABNORMAL HIGH (ref 135–145)

## 2020-12-05 LAB — GLUCOSE, CAPILLARY
Glucose-Capillary: 91 mg/dL (ref 70–99)
Glucose-Capillary: 104 mg/dL — ABNORMAL HIGH (ref 70–99)
Glucose-Capillary: 111 mg/dL — ABNORMAL HIGH (ref 70–99)
Glucose-Capillary: 119 mg/dL — ABNORMAL HIGH (ref 70–99)
Glucose-Capillary: 122 mg/dL — ABNORMAL HIGH (ref 70–99)

## 2020-12-05 LAB — CBC
HCT: 30.9 % — ABNORMAL LOW (ref 39.0–52.0)
Hemoglobin: 9.4 g/dL — ABNORMAL LOW (ref 13.0–17.0)
MCH: 32.9 pg (ref 26.0–34.0)
MCHC: 30.4 g/dL (ref 30.0–36.0)
MCV: 108 fL — ABNORMAL HIGH (ref 80.0–100.0)
Platelets: 205 10*3/uL (ref 150–400)
RBC: 2.86 MIL/uL — ABNORMAL LOW (ref 4.22–5.81)
RDW: 15 % (ref 11.5–15.5)
WBC: 7.1 10*3/uL (ref 4.0–10.5)
nRBC: 0 % (ref 0.0–0.2)

## 2020-12-05 MED ORDER — OSMOLITE 1.5 CAL PO LIQD
1000.0000 mL | ORAL | Status: DC
  Administered 2020-12-05 – 2020-12-09 (×5): 1000 mL
  Filled 2020-12-05 (×11): qty 1000

## 2020-12-05 NOTE — Plan of Care (Signed)
  Problem: Respiratory: Goal: Respiratory symptoms related to disease process will be avoided Outcome: Progressing   Problem: Urinary Elimination: Goal: Progression of disease will be identified and treated Outcome: Progressing   Problem: Respiratory: Goal: Patent airway maintenance will improve Outcome: Progressing   Problem: Role Relationship: Goal: Ability to communicate will improve Outcome: Progressing

## 2020-12-05 NOTE — Progress Notes (Signed)
PROGRESS NOTE    Jackson Figueroa  UXN:[id number] DOB: 05/29/1961 DOA: 10/20/2020 PCP: Patient, No Pcp Per  Brief Narrative: 59 year old male with no significant past medical history was admitted to [facility] with on 11/28 with severe hypoxic respiratory failure, ARDS/Covid pneumonia, he tested positive on 11/26 PTA. -Intubated on arrival and transferred to [facility] long hospital 11/29 -Prolonged respiratory failure, eventually had a tracheostomy on 12/23 and PEG tube placed on 12/30 [facility] course also complicated by severe AKI due to ATN from septic shock and Covid, required CRRT from 12/16-12/26, subsequently started intermittent dialysis, last HD 12/28, HD catheter was removed on 1/4. 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, started on minocycline 12/21 -Transferred from PCCM to [facility] service  1/10 Awaiting Rehab placement, may need LTACH  Assessment & Plan:   Acute hypoxic and hypercarbic respiratory failure ARDS due to COVID-19 -Completed therapy for Covid including IV steroids, baricitinib, remdesivir -Underwent tracheostomy on 12/23, continue routine trach care -Stable on trach collar-O2 35%, off nightly ventilation since 1/6 -SLP following for PMV and swallow assessments -Increased secretions, continue frequent suctioning, pulmonary toilet -Pulmonary will continue to follow for trach management  -Discharge planning, TOC following 1/13 No change   Acute metabolic encephalopathy -In the setting of prolonged mechanical ventilation and Covid pneumonia/respiratory failure -Improving -Continue low-dose Seroquel nightly -Dysphagia-continue tube feeds via PEG 1/13 Will need MBS tomorrow, SLP following.   Hypernatremia -Secondary to diarrhea and increased insensible losses - increase free water, restart D5 water for 12 hours -BMP in a.m. 1/13 Sodium improved to 147, continue free water, repeat labs in am.   MDR stenotrophomonas  pneumonia -Seen by ID this admission, started on minocycline 12/21 stopped on 12/28, repeat Cx w/ stenotrophomonas again, restarted on minocycline for 2 week course on 12/31, to complete course 1/13 -Has completed multiple additional antibiotic courses for aspiration pneumonia this admission while in ICU -stable now, continue pulm toilet  Chronic diarrhea -Secondary to tube feeds, continue Flexi-Seal, add Imodium today -Clinically do not suspect infectious diarrhea at this time 1/13 Will continue imodium as needed.   Severe acute kidney injury ATN from Covid and shock -Resolved, required CRRT from 12/16-12/26, subsequently started intermittent dialysis, last HD 12/28, HD catheter was removed on 1/4 -Creatinine improved and stable, 1.13  now  Critical illness neuropathy Severe deconditioning -Patient has minimal motor strength in upper and lower extremities -Anticipate will need prolonged rehabilitation  Anemia due to acute illness -Stable, monitor  Pressure injury sacrum, toes -Continue wound care -Per nursing, patient already with offloading mattress  Moderate protein calorie malnutrition -Continue tube feeds via PEG tube -SLP following  DVT prophylaxis: Subcutaneous heparin Code Status: Full code Family Communication: His sister was updated over the phone on 1/13 Disposition Plan: Likely LTACH, CM following.  Status is: Inpatient  Remains inpatient appropriate because:Inpatient level of care appropriate due to severity of illness   Dispo: The patient is from: Home              Anticipated d/c is to: will need SNF that manages trach pts v LTACH              Anticipated d/c date is: > 3 days              Patient currently is not medically stable to d/c.  Consultants:   PCCM, nephrology, palliative medicine   Procedures: Tracheostomy 12/23  PEG tube 12/30  Antimicrobials:    Subjective: Patient would like to start rehab soon.  Denies abdominal pain.    Objective: Vitals:   12/05/20 1100 12/05/20 1200 12/05/20 1600 12/05/20 1700  BP: (!) 124/100 (!) 135/99 (!) 116/93   Pulse: (!) 105 (!) 108 (!) 109 100  Resp: (!) 22 20 (!) 23   Temp:   98.6 F (37 C) 98.2 F (36.8 C)  TempSrc:   Oral Other (Comment)  SpO2: 99% 97% 100%   Weight:      Height:        Intake/Output Summary (Last 24 hours) at 12/05/2020 1952 Last data filed at 12/05/2020 1800 Gross per 24 hour  Intake 4095.9 ml  Output 1550 ml  Net 2545.9 ml   Filed Weights   11/19/20 0500 11/21/20 0447 11/22/20 0500  Weight: 108.3 kg 108.5 kg 110.9 kg    Examination:  General exam: Chronically ill middle-aged male, resting in bed, in NAD.    HEENT: Trach collar in place CVS: S1-S2 present, mild tachycarda Lungs: No wheezing, no rhonchi Abdomen: Soft, nontender Extremities: No edema Neuro: Significant upper and lower extremity weakness Psych: Appropriate mood and affect, reported as improved  Data Reviewed:   CBC: Recent Labs  Lab 12/01/20 0131 12/02/20 0625 12/03/20 0217 12/05/20 0143  WBC 9.0 9.2 8.3 7.1  HGB 9.1* 9.2* 9.0* 9.4*  HCT 30.5* 31.5* 28.0* 30.9*  MCV 108.9* 109.4* 104.9* 108.0*  PLT 213 212 213 353   Basic Metabolic Panel: Recent Labs  Lab 12/01/20 0131 12/02/20 0625 12/03/20 0217 12/04/20 0601 12/05/20 0143  NA 158* 154* 150* 151* 147*  K 4.7 4.5 4.5 4.7 4.6  CL 126* 122* 121* 118* 116*  CO2 20* 20* 20* 22 21*  GLUCOSE 126* 152* 132* 124* 109*  BUN 87* 80* 81* 78* 69*  CREATININE 1.45* 1.43* 1.32* 1.19 1.13  CALCIUM 9.6 9.5 9.2 9.2 9.1   GFR: Estimated Creatinine Clearance: 92.7 mL/min (by C-G formula based on SCr of 1.13 mg/dL). Liver Function Tests: Recent Labs  Lab 12/02/20 0625  AST 31  ALT 50*  ALKPHOS 83  BILITOT 0.3  PROT 6.3*  ALBUMIN 2.6*   No results for input(s): LIPASE, AMYLASE in the last 168 hours. No results for input(s): AMMONIA in the last 168 hours. Coagulation Profile: No results for input(s): INR,  PROTIME in the last 168 hours. Cardiac Enzymes: No results for input(s): CKTOTAL, CKMB, CKMBINDEX, TROPONINI in the last 168 hours. BNP (last 3 results) No results for input(s): PROBNP in the last 8760 hours. HbA1C: No results for input(s): HGBA1C in the last 72 hours. CBG: Recent Labs  Lab 12/04/20 2329 12/05/20 0329 12/05/20 0745 12/05/20 1143 12/05/20 1638  GLUCAP 114* 91 104* 111* 119*   Lipid Profile: No results for input(s): CHOL, HDL, LDLCALC, TRIG, CHOLHDL, LDLDIRECT in the last 72 hours. Thyroid Function Tests: No results for input(s): TSH, T4TOTAL, FREET4, T3FREE, THYROIDAB in the last 72 hours. Anemia Panel: No results for input(s): VITAMINB12, FOLATE, FERRITIN, TIBC, IRON, RETICCTPCT in the last 72 hours. Urine analysis:    Component Value Date/Time   COLORURINE YELLOW 11/02/2020 1054   APPEARANCEUR CLOUDY (A) 11/02/2020 1054   LABSPEC 1.012 11/02/2020 1054   PHURINE 5.0 11/02/2020 1054   GLUCOSEU NEGATIVE 11/02/2020 1054   HGBUR LARGE (A) 11/02/2020 1054   BILIRUBINUR NEGATIVE 11/02/2020 1054   [place] 11/02/2020 1054   PROTEINUR 30 (A) 11/02/2020 1054   NITRITE NEGATIVE 11/02/2020 1054   LEUKOCYTESUR TRACE (A) 11/02/2020 1054   Sepsis Labs: $[email or web address]$ (procalcitonin:4,lacticidven:4)  )No results found for this or any previous  visit (from the past 240 hour(s)).   Scheduled Meds: . chlorhexidine gluconate (MEDLINE KIT)  15 mL Mouth Rinse BID  . Chlorhexidine Gluconate Cloth  6 each Topical Q0600  . darbepoetin (ARANESP) injection - DIALYSIS  100 mcg Subcutaneous Q Tue-1800  . famotidine  20 mg Per Tube QHS  . feeding supplement (PROSource TF)  90 mL Per Tube TID  . free water  500 mL Per Tube Q4H  . heparin injection (subcutaneous)  5,000 Units Subcutaneous Q8H  . linagliptin  5 mg Per Tube Daily  . mouth rinse  15 mL Mouth Rinse 10 times per day  . QUEtiapine  50 mg Per Tube QHS  . sodium bicarbonate  650 mg Per Tube BID  . sodium chloride  flush  10-40 mL Intracatheter Q12H   Continuous Infusions: . sodium chloride 10 mL/hr at 12/05/20 0600  . feeding supplement (OSMOLITE 1.5 CAL) 1,000 mL (12/05/20 1749)     LOS: 46 days    Time spent: 35 minutes  Blain Pais, MD [facility]  12/05/2020, 7:52 PM

## 2020-12-05 NOTE — Progress Notes (Signed)
  Speech Language Pathology Treatment: Dysphagia  Patient Details Name: Jackson Figueroa MRN: [id number] DOB: January 19, 1961 Today's Date: 12/05/2020 Time: 2595-6387 SLP Time Calculation (min) (ACUTE ONLY): 23 min  Assessment / Plan / Recommendation Clinical Impression  Pt was seen for dysphagia treatment with his significant other present for part of the session. Pt reported that he was "dying to have some ice chips" today. PMSV has been in place all day per RN and pt; it remained donned during this session with stable vitals. Oral care was provided. Pt tolerated regular texture solids, 1/2 tsp boluses of puree solids and 4/7 boluses of thin liquids via tsp without overt s/sx of aspiration. Mastication was [facility] and no significant oral residue was noted. Pt inconsistently exhibited throat clearing with thin liquids via tsp and coughing was inconsistently observed with thin liquids via straw. However, vocal quality remained clear. Pt's swallow function appears improved compared to that demonstrated on 1/10. A modified barium swallow study is recommended to further assess physiology and help guide intervention.    HPI HPI: Pt is a 60 y.o. with PMH obesity who was admitted 11/28 with respiratory distress and unresponsive at home with Covid 19. Pt with AKI and HD dependent. CRRT 12/16-12/26. Intubated 11/28 until trach 12/23. Encephalopathy since admission. PEG 12/30. 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      SLP Plan  MBS       Recommendations  Diet recommendations: NPO Medication Administration: Via alternative means      Patient may use Passy-Muir Speech Valve: During all waking hours (remove during sleep) PMSV Supervision: Intermittent MD: Please consider changing trach tube to : Smaller size;Cuffless         Oral Care Recommendations: Oral care QID Follow up Recommendations: Skilled Nursing facility SLP Visit Diagnosis:  Dysphagia, unspecified (R13.10) Plan: MBS       Jackson Figueroa I. Hardin Negus, [place], [place] Office number [phone] Pager [place] 12/05/2020, 6:01 PM

## 2020-12-05 NOTE — Progress Notes (Signed)
Rehab Admissions Coordinator Note:  Per PT recommendation, this patient was screened by Raechel Ache for appropriateness for an Inpatient Acute Rehab Consult.  Per chart review, LTAC is being considered; given pt's extensive weakness and multiple medical issues, anticipate he may need an extended facility stay (instead of short term, intensive CIR program). With his insurance, it is unlikely his payor source would approve CIR and then SNF for additional rehab (again, anticipate pt would need longer than a 2-3 week CIR stay before returning home). Would recommended alternative level of care.   Raechel Ache 12/05/2020, 2:24 PM  I can be reached at [phone].

## 2020-12-05 NOTE — Progress Notes (Signed)
Nutrition Follow-up  DOCUMENTATION CODES:   Non-severe (moderate) malnutrition in context of acute illness/injury  INTERVENTION:   Continue TF via G-tube:  Change to Osmolite 1.5 at 65 ml/h (1560 ml per day) Prosource TF 90 ml TID  Provides 2580 kcal, 164 gm protein, 1189 ml free water daily.  Recommend getting updated weight.   NUTRITION DIAGNOSIS:   Moderate Malnutrition related to acute illness (COVID-19) as evidenced by moderate fat depletion,moderate muscle depletion,mild fat depletion,mild muscle depletion.  Ongoing  GOAL:   Provide needs based on ASPEN/SCCM guidelines  Met with TF  MONITOR:   Vent status,Labs,Weight trends,TF tolerance,Skin  REASON FOR ASSESSMENT:   Ventilator,Consult Enteral/tube feeding initiation and management  ASSESSMENT:   60 year old male with no significant medical history. He was found to be COVID-19 positive on 11/26. He presented to [facility] with profound hypoxemia on 11/28 and was emergently intubated in the ED; found to be in ARDS and transferred to [facility].  Discussed pt in ICU rounds.  Patient has been on trach collar for several days. Transferring to [facility] [place] today. He remains deconditioned.   PEG in place. Receiving Vital 1.5 at 65 ml/h with Prosource TF 90 ml TID. Free water 500 ml every 4 hours. Tolerating without difficulty.   No new weight available.  Weight on admission: 120 kg. Last weight on 12/31: 110 kg. Recommend getting updated weight to monitor weight trends.   Labs reviewed. Na 147 (H) CBG: 91-104  Medications reviewed and include Aranesp, Pepcid, Tradjenta, Immodium. Free water flushes 500 ml every 4 hours added 1/12 for hypernatremia (151). Sodium level down to 147 today.  Diet Order:   Diet Order            Diet NPO time specified  Diet effective now                 EDUCATION NEEDS:   No education needs have been identified at this time  Skin:  Skin Assessment: Skin Integrity  Issues: Skin Integrity Issues:: DTI,Unstageable,Other (Comment) DTI: sacrum, toe Unstageable: full thickness to R nose Other: MASD buttocks  Last BM:  1/12 Rectal tube  Height:   Ht Readings from Last 1 Encounters:  11/11/20 6' (1.829 m)    Weight:   Wt Readings from Last 1 Encounters:  11/22/20 110.9 kg     BMI:  Body mass index is 33.16 kg/m.  Estimated Nutritional Needs:   Kcal:  0947-0962  Protein:  160-180 gm  Fluid:  >/= 2.5 L   Lucas Mallow, RD, LDN, CNSC Please refer to Amion for contact information.

## 2020-12-05 NOTE — Progress Notes (Signed)
Physical Therapy Treatment Patient Details Name: Jackson Figueroa MRN: [id number] DOB: 1961/07/07 Today's Date: 12/05/2020    History of Present Illness 60 yo admitted 11/28 with respiratory distress and unresponsive at home with Covid 19. Pt with AKI and HD dependent. CRRT 12/16-12/26. Trach 12/23. encephalopathy since admission. PEG 12/30.PMHx: obesity    PT Comments    Pt pleasant with improved bil UE strength and cognition as well as functional mobility. Pt able to sit EOB with minguard assist after initial assist to gain balance and transfer to chair with max +2 assist. Pt progressing well with maintained poor grip strength on right inhibiting his upper body assist with mobility. Will continue to follow and RN educated for need for lift back to bed.   HR 119 BP 116/76 in chair 96% on 28% FiO2    Follow Up Recommendations  CIR;Supervision/Assistance - 24 hour;LTACH     Equipment Recommendations  Hospital bed;Wheelchair (measurements PT);Wheelchair cushion (measurements PT)    Recommendations for Other Services       Precautions / Restrictions Precautions Precautions: Fall Precaution Comments: PEG, trach, flexiseal, condom cath    Mobility  Bed Mobility Overal bed mobility: Needs Assistance Bed Mobility: Rolling;Sidelying to Sit Rolling: Mod assist Sidelying to sit: Max assist;+2 for physical assistance       General bed mobility comments: mod assist to bend knee, reach for rail and roll toward left. Max +2 assist to rise from side to sitting with assist to bring legs off of side and lift trunk  Transfers Overall transfer level: Needs assistance   Transfers: Sit to/from Stand;Stand Pivot Transfers Sit to Stand: Max assist;+2 physical assistance Stand pivot transfers: Max assist;+2 physical assistance       General transfer comment: max +2 physical assist with bil knees blocked, pad under sacrum and belt to stand from bed x 2 and pivot toward right to drop arm  recliner  Ambulation/Gait             General Gait Details: unable   Stairs             Wheelchair Mobility    Modified Rankin (Stroke Patients Only)       Balance Overall balance assessment: Needs assistance Sitting-balance support: Bilateral upper extremity supported;Feet supported Sitting balance-Leahy Scale: Poor Sitting balance - Comments: pt EOB 5 min with progression from min to minguard assist   Standing balance support: Bilateral upper extremity supported Standing balance-Leahy Scale: Zero Standing balance comment: bil UE support and max +2 assist                            Cognition Arousal/Alertness: Awake/alert Behavior During Therapy: WFL for tasks assessed/performed Overall Cognitive Status: Impaired/Different from baseline Area of Impairment: Safety/judgement                       Following Commands: Follows one step commands with increased time Safety/Judgement: Decreased awareness of deficits   Problem Solving: Slow processing General Comments: pt oriented to month, year, place and situation but not day      Exercises General Exercises - Lower Extremity Long Arc Quad: AAROM;Both;Seated;10 reps Heel Slides: AAROM;Both;Supine;15 reps Hip ABduction/ADduction: AAROM;Both;Supine;15 reps Hip Flexion/Marching: AAROM;Both;Seated;10 reps    General Comments        Pertinent Vitals/Pain Pain Assessment: No/denies pain    Home Living  Prior Function            PT Goals (current goals can now be found in the care plan section) Progress towards PT goals: Progressing toward goals    Frequency    Min 3X/week      PT Plan Discharge plan needs to be updated    Co-evaluation              AM-PAC PT "6 Clicks" Mobility   Outcome Measure  Help needed turning from your back to your side while in a flat bed without using bedrails?: A Lot Help needed moving from lying on your  back to sitting on the side of a flat bed without using bedrails?: A Lot Help needed moving to and from a bed to a chair (including a wheelchair)?: A Lot Help needed standing up from a chair using your arms (e.g., wheelchair or bedside chair)?: Total Help needed to walk in hospital room?: Total Help needed climbing 3-5 steps with a railing? : Total 6 Click Score: 9    End of Session Equipment Utilized During Treatment: Gait belt Activity Tolerance: Patient tolerated treatment well Patient left: in chair;with call bell/phone within reach;with chair alarm set Nurse Communication: Mobility status;Need for lift equipment PT Visit Diagnosis: Other abnormalities of gait and mobility (R26.89);Difficulty in walking, not elsewhere classified (R26.2);Other symptoms and signs involving the nervous system (R29.898);Muscle weakness (generalized) (M62.81)     Time: 1761-6073 PT Time Calculation (min) (ACUTE ONLY): 41 min  Charges:  $Therapeutic Exercise: 8-22 mins $Therapeutic Activity: 23-37 mins                     Gennett Garcia P, PT Acute Rehabilitation Services Pager: [phone] Office: [phone]    Myran Arcia B Phil Corti 12/05/2020, 8:50 AM

## 2020-12-06 ENCOUNTER — Inpatient Hospital Stay (HOSPITAL_COMMUNITY): Payer: BC Managed Care – PPO

## 2020-12-06 DIAGNOSIS — J9601 Acute respiratory failure with hypoxia: Secondary | ICD-10-CM

## 2020-12-06 LAB — GLUCOSE, CAPILLARY
Glucose-Capillary: 108 mg/dL — ABNORMAL HIGH (ref 70–99)
Glucose-Capillary: 109 mg/dL — ABNORMAL HIGH (ref 70–99)
Glucose-Capillary: 115 mg/dL — ABNORMAL HIGH (ref 70–99)
Glucose-Capillary: 119 mg/dL — ABNORMAL HIGH (ref 70–99)
Glucose-Capillary: 119 mg/dL — ABNORMAL HIGH (ref 70–99)
Glucose-Capillary: 83 mg/dL (ref 70–99)
Glucose-Capillary: 92 mg/dL (ref 70–99)

## 2020-12-06 MED ORDER — RESOURCE THICKENUP CLEAR PO POWD
ORAL | Status: DC | PRN
  Filled 2020-12-06: qty 125

## 2020-12-06 NOTE — Progress Notes (Signed)
Occupational Therapy Treatment Patient Details Name: Jackson Figueroa MRN: [id number] DOB: February 16, 1961 Today's Date: 12/06/2020    History of present illness 60 yo admitted 11/28 with respiratory distress and unresponsive at home with Covid 19. Pt with AKI and HD dependent. CRRT 12/16-12/26. Trach 12/23. encephalopathy since admission. PEG 12/30.PMHx: obesity   OT comments  Pt progressing gradually towards OT goals. Pt appropriate and conversing throughout session though limited by fatigue. Pt's wife present and supportive. Pt continues to require Total A for LB ADLs in bed due to significant weakness. Pt able to direct others in oral care routine and requires Mod A to complete due to fatigue and weakness (most notably shoulder flexion weakness). Encouraged pt/spouse in completion of AAROM/AROM exercises and slow stretching outside of therapy sessions to improve functional abilities.    Follow Up Recommendations  LTACH;SNF;Supervision/Assistance - 24 hour    Equipment Recommendations  Hospital bed;Wheelchair (measurements OT);Wheelchair cushion (measurements OT)    Recommendations for Other Services      Precautions / Restrictions Precautions Precautions: Fall Precaution Comments: PEG, trach Restrictions Weight Bearing Restrictions: No       Mobility Bed Mobility Overal bed mobility: Needs Assistance Bed Mobility: Rolling Rolling: Mod assist         General bed mobility comments: Mod A to roll side to side for peri care, able to reach to bed rails to assist.  Transfers                      Balance                                           ADL either performed or assessed with clinical judgement   ADL Overall ADL's : Needs assistance/impaired     Grooming: Moderate assistance;Bed level;Oral care Grooming Details (indicate cue type and reason): Mod A overall for oral care routine with mouthwash swab and suction. able to bend elbow but due to  shoulder weakness, unable to bring to mouth without HOH assist. Pt fatigued quickly with task. max A to wash face at end                     Toileting- Clothing Manipulation and Hygiene: Total assistance;Bed level Toileting - Clothing Manipulation Details (indicate cue type and reason): Total A at bed level for posterior hygiene with NT assisting.       General ADL Comments: Continues to be limited by decreased strength/endurance in all extremities/trunk. Transitioned to progressive unit     Vision   Vision Assessment?: No apparent visual deficits   Perception     Praxis      Cognition Arousal/Alertness: Awake/alert Behavior During Therapy: WFL for tasks assessed/performed Overall Cognitive Status: Impaired/Different from baseline Area of Impairment: Safety/judgement                         Safety/Judgement: Decreased awareness of deficits     General Comments: Able to converse appropriately, follow commands wtih cues. did not assess higher level cognitive features        Exercises     Shoulder Instructions       General Comments Pt's wife present and supportive. Educated on AROM exercises she can assist pt with, as well as slow stretching. Collaborated with pt/spouse on goals with indication of desire to be able to use  phone. With phone held in pt's lap, pt able to use digits to put in passcode, swipe but incoordinated. Discussed voice control options. VSS on supplemental O2    Pertinent Vitals/ Pain       Pain Assessment: Faces Faces Pain Scale: Hurts little more Pain Location: bottom during peri care Pain Descriptors / Indicators: Grimacing Pain Intervention(s): Monitored during session  Home Living                                          Prior Functioning/Environment              Frequency  Min 2X/week        Progress Toward Goals  OT Goals(current goals can now be found in the care plan section)  Progress  towards OT goals: Progressing toward goals  Acute Rehab OT Goals Patient Stated Goal: increase strength OT Goal Formulation: With patient/family Time For Goal Achievement: 12/20/20 Potential to Achieve Goals: Good ADL Goals Pt Will Perform Grooming: with supervision;bed level;sitting Pt Will Perform Upper Body Bathing: with min assist;bed level;sitting  Plan Discharge plan remains appropriate;Frequency remains appropriate    Co-evaluation                 AM-PAC OT "6 Clicks" Daily Activity     Outcome Measure   Help from another person eating meals?: Total (NPO) Help from another person taking care of personal grooming?: A Lot Help from another person toileting, which includes using toliet, bedpan, or urinal?: Total Help from another person bathing (including washing, rinsing, drying)?: Total Help from another person to put on and taking off regular upper body clothing?: Total Help from another person to put on and taking off regular lower body clothing?: Total 6 Click Score: 7    End of Session Equipment Utilized During Treatment: Oxygen  OT Visit Diagnosis: Other abnormalities of gait and mobility (R26.89);Unsteadiness on feet (R26.81);Muscle weakness (generalized) (M62.81)   Activity Tolerance Patient tolerated treatment well   Patient Left in bed;with call bell/phone within reach;with family/visitor present   Nurse Communication          Time: 6967-8938 OT Time Calculation (min): 46 min  Charges: OT General Charges $OT Visit: 1 Visit OT Treatments $Self Care/Home Management : 23-37 mins $Therapeutic Activity: 8-22 mins  Layla Maw, OTR/L   Layla Maw 12/06/2020, 3:13 PM

## 2020-12-06 NOTE — Progress Notes (Signed)
NAME:  Jackson Figueroa, MRN:  [id number], DOB:  25-Apr-1961, LOS: 55 ADMISSION DATE:  10/20/2020, CONSULTATION DATE:  11/29 REFERRING MD:  Sabra Heck, CHIEF COMPLAINT:  ARDS COVID 10   Brief History:  60 year old male with no significant past medical history and a positive COVID-19 test on 11/26 presented to [facility] with profound hypoxemia on 11/28. Intubated immediately upon arrival found to be in ARDS. Transferred to [facility] long hospital for ongoing ICU care  11/29.  Past Medical History:  Obesity   Significant Hospital Events:  11/28 Presented to [facility] ER, intubated for hypoxemia, empiric abx, shock on levo. COVID Rx 11/29 Persistent shock, central line placed, ARDS protocol continued, proning initiated 11/30 Pressor requirements continued, NMB initiated to facilitate ventilation tube feeds started, initiated proning 12/01 Stopping NMB, proning positioning protocol discontinued, free water for rising creatinine and sodium 12/02 PEEP trending down. Renal function improved. Changed to versed from prop gtt d/t rising triglycerides  12/03 Remdesivir completed. Peep/FIO2 stable. Free water adjusted. Solumedrol reduced, fentanyl to Dilaudid, adding Oxycodone 12/04 Dyssynchrony, given paralytic overnight, changed to ketamine; vomited / aspirated, restarted paralytics 12/06 Proned, under NMB 12/07 Proning, NMB continued. Switched antibiotics for stenotrophomonas 12/13 Stopped oxycodone, added dilaudid oral, continuous nimbex infusion to prn, palliative care consulted 12/14 Changed to pressure control 12/15 Worsening vent synchrony after clamping OG tube 12/18 added reglan IV for ? Ileus developing  12/18 back on paralytics for "guppy breathing/ air stacking" on PCV ? ,mucus plug  12/19  Back on PCV and off paralytics  12/20 having intermittent fevers, atrial fibrillation with some ST changes.  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 Started on minocycline per ID 12/22 stable decreased steroid dosing. Clonazepam added 12/23 trach placed. Versed off. Changed dilaudid from 4mg -.3mg , added clonidine and increased depakote. Added midodrine. OGT out during surg. Post-pyloric dislodged. Tip in stomach. Attempting trickle feeds w/ reglan  12/24 NG tube placed by IR 12/25 Dilaudid weaned to off, tolerated trach collar 12/26 tolerated weaning for about 4 hours on trach collar 12/30 G tube with IR  Consults:   Nephrology 12/14 Palliative 12/13 ID signed off 12/17  Procedures:  ETT 11/28 >>trach 12/23 Bates CVL 11/29 >> 12/9 R PICC 12/9 >>  R IJ HD Cath 12/17 >> G tube 12/30>>  Significant Diagnostic Tests:   ECHO 11/29 >>LVEF 50-55%, mild LVH, RV systolic function mildly reduced. Mild dilation of ascending aorta 62mm. LE Venous Duplex 12/8 >> negative for DVT   Micro Data:   COVID 11/28 >> Positive  Influenza A/B 11/28 >> negative  UC 11/28 >> negative  BCx2 11/28 >> negative  Tracheal aspirate 12/5 >> Stenotrophomonas maltophilia >> S-bactrim, levofloxacin UC 12/11 >> negative  Tracheal aspirate 12/11 >> Stenotrophomonas maltophilia >> S-bactrim, levofloxacin  BCx2 12/11 >> negative  Trach asp 12/ 19 >>> multidrug resistance stenotrophomonas Tracheal stoma 12/28> MDR moderate strep mitis/oralis, rare steno  Antimicrobials:   CTX 11/28 > 11/29 Azithromycin 11/28 > 11/29 Remdesivir 11/28 > 12/2 Prednisone 11/28 >  Baricitinib 12/4 > 12/5 Vanco 12/5 >> 12/7, 12/11 x1 Cefepime 12/5 >> 12/7, 12/11 >> 12/13 Levaquin 12/7 >> 12/16 Minocycline 12/21>12/28, 12/31>>   Rocephin 12/31>>  Interim History / Subjective:  Awake alert and interactive PMV in place  Objective   Blood pressure (!) 136/96, pulse (!) 101, temperature 98.7 F (37.1 C),  temperature source  Oral, resp. rate 20, height 6' (1.829 m), weight 110.9 kg, SpO2 98 %.    FiO2 (%):  [28 %] 28 %   Intake/Output Summary (Last 24 hours) at 12/06/2020 1111 Last data filed at 12/06/2020 0411 Gross per 24 hour  Intake 553.71 ml  Output 850 ml  Net -296.29 ml   Filed Weights   11/19/20 0500 11/21/20 0447 11/22/20 0500  Weight: 108.3 kg 108.5 kg 110.9 kg    Examination:  General: Supine, PMV in place HENT: Tracheostomy in place PULM: Decreased air movement, no added sounds CV: S1-S2 appreciated GI: BS+, soft, non tender, tolerating TF MSK: normal bulk and tone, weak and deconditioned after long critical illness Neuro: Awake and alert and interactive   Resolved Hospital Problem list   Oliguric AKI - improved. Renal signed off.   Assessment & Plan:  Acute hypoxemic and hypercapnic respiratory failure ARDS due to COVID-19 -Routine trach care -Has been using PMV  Acute metabolic encephalopathy -Improving  MDR stenotrophomonas pneumonia -On minocycline  Profound deconditioning PT/OT following  Anemia of chronic illness Transfuse per protocol  Tissue injury sacrum, toes Wound care  Has not required a vent so can potentially be downsized to a size 6 cuffless trach  Best practice (evaluated daily)  Diet: tube feeding Pain/Anxiety/Delirium protocol (if indicated): no PAD, minimize sedation VAP protocol (if indicated): yes DVT prophylaxis: Sub q heparin GI prophylaxis: Pantoprazole for stress ulcer prophylaxis Glucose control: monitor Mobility: PT working with patient Disposition: SNF. Uninsured and not eligible for [facility]

## 2020-12-06 NOTE — Progress Notes (Signed)
Physical Therapy Treatment Patient Details Name: Jackson Figueroa MRN: [id number] DOB: Oct 20, 1961 Today's Date: 12/06/2020    History of Present Illness 60 yo admitted 11/28 with respiratory distress and unresponsive at home with Covid 19. Pt with AKI and HD dependent. CRRT 12/16-12/26. Trach 12/23. encephalopathy since admission. PEG 12/30.PMHx: obesity    PT Comments    Pt with improving bil UE strength with maintained weakness, improved sitting balance and improving cognition and awareness. Pt able to transfer to chair again today but decreased ability to engage quads and stand today with need for lateral transfer. Pt will benefit from continued tilting, transfers and HEP to overall improve function. Encouraged continued attempts for all extremity movement throughout the day.   SpO2 88% on RA on arrival as trach collar off trach. On 28% FiO2 SpO2 95-99%  HR 124 with activity BP 109/36   Follow Up Recommendations  Supervision/Assistance - 24 hour;LTACH     Equipment Recommendations  Hospital bed;Wheelchair (measurements PT);Wheelchair cushion (measurements PT)    Recommendations for Other Services       Precautions / Restrictions Precautions Precautions: Fall Precaution Comments: PEG, trach, flexiseal, condom cath    Mobility  Bed Mobility Overal bed mobility: Needs Assistance Bed Mobility: Rolling;Sidelying to Sit Rolling: Mod assist Sidelying to sit: Max assist       General bed mobility comments: rolling bil with assist to bend knee, cues for sequence and using bil UE to assist and guide arms to rail. Pt able to roll trunk without assist with assistance for pelvis. Max assist to bring legs off bed and elevate trunk to sitting  Transfers Overall transfer level: Needs assistance   Transfers: Lateral/Scoot Transfers Sit to Stand: Max assist;+2 physical assistance        Lateral/Scoot Transfers: Max assist;+2 physical assistance General transfer comment: max +2 physical  assist with bil knees blocked, pad under sacrum and belt to stand from bed x 2 with pt unable to engage and assist on first trial and 2nd able to clear buttock but not fully stand. Pt then pivoted with sequential scoots from bed to drop arm recliner. RN and NT aware of need for maxi Move to return to bed in <2hrs due to sacral skin breakdown  Ambulation/Gait             General Gait Details: unable   Stairs             Wheelchair Mobility    Modified Rankin (Stroke Patients Only)       Balance Overall balance assessment: Needs assistance Sitting-balance support: No upper extremity supported;Feet supported Sitting balance-Leahy Scale: Fair Sitting balance - Comments: EOB 8 min with minguard for safety and cues for neck extension     Standing balance-Leahy Scale: Zero Standing balance comment: bil UE support and max +2 assist                            Cognition Arousal/Alertness: Awake/alert Behavior During Therapy: WFL for tasks assessed/performed Overall Cognitive Status: Impaired/Different from baseline                     Current Attention Level: Selective Memory: Decreased short-term memory Following Commands: Follows one step commands with increased time Safety/Judgement: Decreased awareness of deficits   Problem Solving: Slow processing        Exercises General Exercises - Lower Extremity Long Arc Quad: AAROM;Both;Seated;10 reps Heel Slides: AAROM;Both;Supine;15 reps Hip Flexion/Marching: AAROM;Both;Seated;10 reps  General Comments        Pertinent Vitals/Pain Pain Assessment: No/denies pain    Home Living                      Prior Function            PT Goals (current goals can now be found in the care plan section) Progress towards PT goals: Progressing toward goals    Frequency    Min 3X/week      PT Plan Current plan remains appropriate    Co-evaluation              AM-PAC PT "6  Clicks" Mobility   Outcome Measure  Help needed turning from your back to your side while in a flat bed without using bedrails?: A Lot Help needed moving from lying on your back to sitting on the side of a flat bed without using bedrails?: A Lot Help needed moving to and from a bed to a chair (including a wheelchair)?: A Lot Help needed standing up from a chair using your arms (e.g., wheelchair or bedside chair)?: Total Help needed to walk in hospital room?: Total Help needed climbing 3-5 steps with a railing? : Total 6 Click Score: 9    End of Session Equipment Utilized During Treatment: Gait belt Activity Tolerance: Patient tolerated treatment well Patient left: in chair;with call bell/phone within reach;with chair alarm set Nurse Communication: Mobility status;Need for lift equipment PT Visit Diagnosis: Other abnormalities of gait and mobility (R26.89);Difficulty in walking, not elsewhere classified (R26.2);Other symptoms and signs involving the nervous system (R29.898);Muscle weakness (generalized) (M62.81)     Time: 9179-1505 PT Time Calculation (min) (ACUTE ONLY): 50 min  Charges:  $Therapeutic Exercise: 8-22 mins $Therapeutic Activity: 23-37 mins                     Alexus Galka P, PT Acute Rehabilitation Services Pager: [phone] Office: [phone]    Minh Roanhorse B Linda Biehn 12/06/2020, 9:27 AM

## 2020-12-06 NOTE — Progress Notes (Addendum)
PROGRESS NOTE    Jackson Figueroa  XBD:[id number] DOB: Dec 23, 1960 DOA: 10/20/2020 PCP: Patient, No Pcp Per  Brief Narrative: 60 year old male with no significant past medical history was admitted to [facility] with on 11/28 with severe hypoxic respiratory failure, ARDS/Covid pneumonia, he tested positive on 11/26 PTA. -Intubated on arrival and transferred to [facility] long hospital 11/29 -Prolonged respiratory failure, eventually had a tracheostomy on 12/23 and PEG tube placed on 12/30 [facility] course also complicated by severe AKI due to ATN from septic shock and Covid, required CRRT from 12/16-12/26, subsequently started intermittent dialysis, last HD 12/28, HD catheter was removed on 1/4. 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, started on minocycline 12/21 -Transferred from PCCM to [facility] service  1/10 Awaiting Rehab placement, may need LTACH  Assessment & Plan:   Acute hypoxic and hypercarbic respiratory failure ARDS due to COVID-19 -Completed therapy for Covid including IV steroids, baricitinib, remdesivir -Underwent tracheostomy on 12/23, continue routine trach care -Stable on trach collar-O2 35%, off nightly ventilation since 1/6 -SLP following for PMV and swallow assessments -Increased secretions, continue frequent suctioning, pulmonary toilet -Pulmonary will continue to follow for trach management  -Discharge planning, TOC following 1/13 No change  1/14 Stable  Acute metabolic encephalopathy -In the setting of prolonged mechanical ventilation and Covid pneumonia/respiratory failure -Improving -Continue low-dose Seroquel nightly -Dysphagia-continue tube feeds via PEG 1/13 Will need MBS tomorrow, SLP following.  1/14 Will start pudding and fluids thickened to nectar consistency   Hypernatremia -Secondary to diarrhea and increased insensible losses - increase free water, restart D5 water for 12 hours -BMP in a.m. 1/13 Sodium improved  to 147, continue free water, repeat labs in am.  1/14 Will repeat labs in am, continue free water.   MDR stenotrophomonas pneumonia -Seen by ID this admission, started on minocycline 12/21 stopped on 12/28, repeat Cx w/ stenotrophomonas again, restarted on minocycline for 2 week course on 12/31, to complete course 1/13 -Has completed multiple additional antibiotic courses for aspiration pneumonia this admission while in ICU -stable now, continue pulm toilet  Chronic diarrhea -Secondary to tube feeds, continue Flexi-Seal, add Imodium today -Clinically do not suspect infectious diarrhea at this time 1/13 Will continue imodium as needed.  1/14 Continue FMS and imodium as needed.   Severe acute kidney injury ATN from Covid and shock -Resolved, required CRRT from 12/16-12/26, subsequently started intermittent dialysis, last HD 12/28, HD catheter was removed on 1/4 -Creatinine improved and stable, 1.13  Now 1/14 recheck labs for Scr in am  Critical illness neuropathy Severe deconditioning -Patient has minimal motor strength in upper and lower extremities -Anticipate will need prolonged rehabilitation  Anemia due to acute illness -Stable, monitor  Pressure injury sacrum, toes -Continue wound care -Per nursing, patient already with offloading mattress  Moderate protein calorie malnutrition -Continue tube feeds via PEG tube -SLP following 1/14 Starting nectar thick liquids today  DVT prophylaxis: Subcutaneous heparin Code Status: Full code Family Communication: His sister was updated over the phone on 1/13 Disposition Plan: Likely LTACH, CM following.  Status is: Inpatient  Remains inpatient appropriate because:Inpatient level of care appropriate due to severity of illness   Dispo: The patient is from: Home              Anticipated d/c is to: will need SNF that manages trach pts v LTACH              Anticipated d/c date is: > 3 days  Patient currently is not  medically stable to d/c.  Consultants:   PCCM, nephrology, palliative medicine   Procedures: Tracheostomy 12/23  PEG tube 12/30  Antimicrobials:    Subjective: Patient reports having a busy day today with SLP, PT, and OT. He feels tired but willing to work with therapies, eager to go to Rehab as soon as possible.  This afternoon, per nursing report, patient had a pea size dark material out of his nostril while he was coughing but with no active bleeding.   Objective: Vitals:   12/06/20 0922 12/06/20 1601 12/06/20 1650 12/06/20 1730  BP:  (!) 135/101 109/82   Pulse:  (!) 107 (!) 107   Resp:  (!) _0 Temp:  98.5 F (36.9 C)    TempSrc:  Oral    SpO2: 98% 93% 96%   Weight:      Height:        Intake/Output Summary (Last 24 hours) at 12/06/2020 1909 Last data filed at 12/06/2020 0411 Gross per 24 hour  Intake --  Output 850 ml  Net -850 ml   Filed Weights   11/19/20 0500 11/21/20 0447 11/22/20 0500  Weight: 108.3 kg 108.5 kg 110.9 kg    Examination:  General exam: Chronically ill middle-aged male, resting in bed, in NAD.    HEENT: Trach collar in place CVS: S1-S2 present, mild tachycarda Lungs: No wheezing, no rhonchi Abdomen: Soft, nontender Extremities: No edema Neuro: Significant upper and lower extremity weakness Psych: Appropriate mood and affect, reported as improved  Data Reviewed:   CBC: Recent Labs  Lab 12/01/20 0131 12/02/20 0625 12/03/20 0217 12/05/20 0143  WBC 9.0 9.2 8.3 7.1  HGB 9.1* 9.2* 9.0* 9.4*  HCT 30.5* 31.5* 28.0* 30.9*  MCV 108.9* 109.4* 104.9* 108.0*  PLT 213 212 213 790   Basic Metabolic Panel: Recent Labs  Lab 12/01/20 0131 12/02/20 0625 12/03/20 0217 12/04/20 0601 12/05/20 0143  NA 158* 154* 150* 151* 147*  K 4.7 4.5 4.5 4.7 4.6  CL 126* 122* 121* 118* 116*  CO2 20* 20* 20* 22 21*  GLUCOSE 126* 152* 132* 124* 109*  BUN 87* 80* 81* 78* 69*  CREATININE 1.45* 1.43* 1.32* 1.19 1.13  CALCIUM 9.6 9.5 9.2 9.2 9.1    GFR: Estimated Creatinine Clearance: 92.7 mL/min (by C-G formula based on SCr of 1.13 mg/dL). Liver Function Tests: Recent Labs  Lab 12/02/20 0625  AST 31  ALT 50*  ALKPHOS 83  BILITOT 0.3  PROT 6.3*  ALBUMIN 2.6*   No results for input(s): LIPASE, AMYLASE in the last 168 hours. No results for input(s): AMMONIA in the last 168 hours. Coagulation Profile: No results for input(s): INR, PROTIME in the last 168 hours. Cardiac Enzymes: No results for input(s): CKTOTAL, CKMB, CKMBINDEX, TROPONINI in the last 168 hours. BNP (last 3 results) No results for input(s): PROBNP in the last 8760 hours. HbA1C: No results for input(s): HGBA1C in the last 72 hours. CBG: Recent Labs  Lab 12/06/20 0028 12/06/20 0403 12/06/20 0726 12/06/20 1211 12/06/20 1559  GLUCAP 119* 108* 119* 115* 109*   Lipid Profile: No results for input(s): CHOL, HDL, LDLCALC, TRIG, CHOLHDL, LDLDIRECT in the last 72 hours. Thyroid Function Tests: No results for input(s): TSH, T4TOTAL, FREET4, T3FREE, THYROIDAB in the last 72 hours. Anemia Panel: No results for input(s): VITAMINB12, FOLATE, FERRITIN, TIBC, IRON, RETICCTPCT in the last 72 hours. Urine analysis:    Component Value Date/Time   COLORURINE YELLOW 11/02/2020 1054   APPEARANCEUR  CLOUDY (A) 11/02/2020 1054   LABSPEC 1.012 11/02/2020 1054   PHURINE 5.0 11/02/2020 1054   GLUCOSEU NEGATIVE 11/02/2020 1054   HGBUR LARGE (A) 11/02/2020 1054   BILIRUBINUR NEGATIVE 11/02/2020 1054   [place] 11/02/2020 1054   PROTEINUR 30 (A) 11/02/2020 1054   NITRITE NEGATIVE 11/02/2020 1054   LEUKOCYTESUR TRACE (A) 11/02/2020 1054   Sepsis Labs: _0 (procalcitonin:4,lacticidven:4)  )No results found for this or any previous visit (from the past 240 hour(s)).   Scheduled Meds: . chlorhexidine gluconate (MEDLINE KIT)  15 mL Mouth Rinse BID  . Chlorhexidine Gluconate Cloth  6 each Topical Q0600  . famotidine  20 mg Per Tube QHS  . feeding  supplement (PROSource TF)  90 mL Per Tube TID  . free water  500 mL Per Tube Q4H  . heparin injection (subcutaneous)  5,000 Units Subcutaneous Q8H  . linagliptin  5 mg Per Tube Daily  . mouth rinse  15 mL Mouth Rinse 10 times per day  . QUEtiapine  50 mg Per Tube QHS  . sodium bicarbonate  650 mg Per Tube BID  . sodium chloride flush  10-40 mL Intracatheter Q12H   Continuous Infusions: . sodium chloride 10 mL/hr at 12/05/20 0600  . feeding supplement (OSMOLITE 1.5 CAL) 1,000 mL (12/05/20 1749)     LOS: 47 days    Time spent: 25 minutes  Blain Pais, MD [facility]  12/06/2020, 7:09 PM

## 2020-12-06 NOTE — Progress Notes (Signed)
Pt no longer needing HD due to resolved AKI. Ok to [employer] [facility] per Dr. Hayes Ludwig.  Onnie Boer, PharmD, BCIDP, AAHIVP, CPP Infectious Disease Pharmacist 12/06/2020 10:14 AM

## 2020-12-06 NOTE — Progress Notes (Signed)
  Speech Language Pathology Treatment: Dysphagia  Patient Details Name: Jackson Figueroa MRN: [id number] DOB: 1961-09-16 Today's Date: 12/06/2020 Time: 1710-1730 SLP Time Calculation (min) (ACUTE ONLY): 20 min  Assessment / Plan / Recommendation Clinical Impression  Pt was seen for dysphagia treatment. He was educated regarding the results of the modified barium swallow study, diet recommendations, and plan of care. He verbalized understanding regarding areas of education and all of his questions were answered. No s/sx of aspiration were noted with nectar thick liquids when an effortful swallow was utilized, but slight throat clearing was noted once. Pt's nurse was educated regarding recommendation for floor-stock puree and nectar thick liquids as requested. Thickener has been ordered. SLP will follow for dysphagia treatment and subsequent initiation of a diet.    HPI HPI: Pt is a 60 y.o. with PMH obesity who was admitted 11/28 with respiratory distress and unresponsive at home with Covid 19. Pt with AKI and HD dependent. CRRT 12/16-12/26. Intubated 11/28 until trach 12/23. Encephalopathy since admission. PEG 12/30. 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      SLP Plan  Continue with current plan of care       Recommendations  Diet recommendations:  (Pt may have floor-stock puree and liquids thickened to nectar consistency) Medication Administration: Via alternative means      Patient may use Passy-Muir Speech Valve: During all waking hours (remove during sleep) PMSV Supervision: Intermittent MD: Please consider changing trach tube to : Smaller size;Cuffless         Oral Care Recommendations: Oral care QID Follow up Recommendations: Skilled Nursing facility SLP Visit Diagnosis: Dysphagia, unspecified (R13.10) Plan: Continue with current plan of care       Tyree Fluharty I. Hardin Negus, [place], [place] Office number [phone] Pager [place] 12/06/2020, 5:49 PM

## 2020-12-06 NOTE — Progress Notes (Signed)
Modified Barium Swallow Progress Note  Patient Details  Name: Jackson Figueroa MRN: [id number] Date of Birth: May 19, 1961  Today's Date: 12/06/2020  Modified Barium Swallow completed.  Full report located under Chart Review in the Imaging Section.  Brief recommendations include the following:  Clinical Impression  Pt presents with pharyngeal dysphagia characterized by reduced lingual retraction, reduced anterior laryngeal movement, reduced laryngeal elevation, and a pharyngeal delay. He demonstrated vallecular residue and pyriform sinus residue which increased with bolus size and with advancement of bolus consistency. Residue was reduced, but not eliminated with secondary swallows and with liquid washes. Penetration (PAS 5) and aspiration (PAS 7) were noted with thin liquids. Penetration (PAS 3) was observed with larger boluses of nectar thick liquids and a single instance of aspiration (PAS 7) was noted when consecutive swallows of nectar thick liquids were cued. All instances of aspiration resulted in coughing though the immediacy of coughing was inconsistent. Pt's cough was strong and effective in expelling the aspirate in the majority of cases. Penetration and aspiration of nectar thick liquids were eliminated with use of an effortful swallow. Pt's status will be liberalized from NPO to allow intake of floorstock purees (i.e., applesauce and pudding) and nectar thick liquids over the weekend and it is anticipated that a p.o. diet will be initiated next week. SLP will continue to follow pt.    Swallow Evaluation Recommendations       SLP Diet Recommendations:  (Pt may have floorstock purees and nectar thick liquids with strict observance of swallowing precautions.)   Liquid Administration via: Cup;Straw   Medication Administration: Via alternative means   Supervision: Full assist for feeding   Compensations: Slow rate;Small sips/bites, effortful swallow   Postural Changes: Remain semi-upright  after after feeds/meals (Comment);Seated upright at 90 degrees   Oral Care Recommendations: Oral care QID;Oral care before and after PO       Jackson Figueroa I. Hardin Negus, [place], [place] Office number [phone] Pager [phone]  Horton Marshall 12/06/2020,2:17 PM

## 2020-12-07 LAB — GLUCOSE, CAPILLARY
Glucose-Capillary: 85 mg/dL (ref 70–99)
Glucose-Capillary: 86 mg/dL (ref 70–99)
Glucose-Capillary: 105 mg/dL — ABNORMAL HIGH (ref 70–99)
Glucose-Capillary: 138 mg/dL — ABNORMAL HIGH (ref 70–99)
Glucose-Capillary: 135 mg/dL — ABNORMAL HIGH (ref 70–99)
Glucose-Capillary: 133 mg/dL — ABNORMAL HIGH (ref 70–99)

## 2020-12-07 LAB — BASIC METABOLIC PANEL
Anion gap: 10 (ref 5–15)
BUN: 62 mg/dL — ABNORMAL HIGH (ref 6–20)
CO2: 23 mmol/L (ref 22–32)
Calcium: 9 mg/dL (ref 8.9–10.3)
Chloride: 113 mmol/L — ABNORMAL HIGH (ref 98–111)
Creatinine, Ser: 1.13 mg/dL (ref 0.61–1.24)
GFR, Estimated: >60 mL/min (ref >60)
Glucose, Bld: 91 mg/dL (ref 70–99)
Potassium: 4.5 mmol/L (ref 3.5–5.1)
Sodium: 146 mmol/L — ABNORMAL HIGH (ref 135–145)

## 2020-12-07 LAB — PHOSPHORUS: Phosphorus: 4.7 mg/dL — ABNORMAL HIGH (ref 2.5–4.6)

## 2020-12-07 LAB — MAGNESIUM: Magnesium: 2.1 mg/dL (ref 1.7–2.4)

## 2020-12-07 NOTE — Progress Notes (Signed)
PROGRESS NOTE    Jackson Figueroa  ZHY:[id number] DOB: September 12, 1961 DOA: 10/20/2020 PCP: Patient, No Pcp Per  Brief Narrative: 60 year old male with no significant past medical history was admitted to [facility] with on 11/28 with severe hypoxic respiratory failure, ARDS/Covid pneumonia, he tested positive on 11/26 PTA. -Intubated on arrival and transferred to [facility] long hospital 11/29 -Prolonged respiratory failure, eventually had a tracheostomy on 12/23 and PEG tube placed on 12/30 [facility] course also complicated by severe AKI due to ATN from septic shock and Covid, required CRRT from 12/16-12/26, subsequently started intermittent dialysis, last HD 12/28, HD catheter was removed on 1/4. 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, started on minocycline 12/21 -Transferred from PCCM to [facility] service  1/10 Awaiting Rehab placement, may need LTACH  Assessment & Plan:   Acute hypoxic and hypercarbic respiratory failure ARDS due to COVID-19 -Completed therapy for Covid including IV steroids, baricitinib, remdesivir -Underwent tracheostomy on 12/23, continue routine trach care -Stable on trach collar-O2 35%, off nightly ventilation since 1/6 -SLP following for PMV and swallow assessments -Increased secretions, continue frequent suctioning, pulmonary toilet -Pulmonary will continue to follow for trach management  -Discharge planning, TOC following 1/13 No change  1/14 Stable 1/15 Stable, no changes  Acute metabolic encephalopathy -In the setting of prolonged mechanical ventilation and Covid pneumonia/respiratory failure -Improving -Continue low-dose Seroquel nightly -Dysphagia-continue tube feeds via PEG 1/13 Will need MBS tomorrow, SLP following.  1/14 Will start pudding and fluids thickened to nectar consistency  1/15 Tolerating some po intake, will need ongoing SLP eval.   Hypernatremia -Secondary to diarrhea and increased insensible losses -  increase free water, restart D5 water for 12 hours -BMP in a.m. 1/13 Sodium improved to 147, continue free water, repeat labs in am.  1/14 Will repeat labs in am, continue free water.  1/15 Sodium level has improved to 146, continue po intake  MDR stenotrophomonas pneumonia -Seen by ID this admission, started on minocycline 12/21 stopped on 12/28, repeat Cx w/ stenotrophomonas again, restarted on minocycline for 2 week course on 12/31, to complete course 1/13 -Has completed multiple additional antibiotic courses for aspiration pneumonia this admission while in ICU -stable now, continue pulm toilet  Chronic diarrhea -Secondary to tube feeds, continue Flexi-Seal, add Imodium today -Clinically do not suspect infectious diarrhea at this time 1/13 Will continue imodium as needed.  1/14 Continue FMS and imodium as needed.  1/15 Anticipate that this will improve as he increases his po intake.   Severe acute kidney injury ATN from Covid and shock -Resolved, required CRRT from 12/16-12/26, subsequently started intermittent dialysis, last HD 12/28, HD catheter was removed on 1/4 -Creatinine improved and stable, 1.13  Now 1/14 recheck labs for Scr in am 1/15 Scr remains stable  Critical illness neuropathy Severe deconditioning -Patient has minimal motor strength in upper and lower extremities -Anticipate will need prolonged rehabilitation  Anemia due to acute illness -Stable, monitor  Pressure injury sacrum, toes -Continue wound care -Per nursing, patient already with offloading mattress  Moderate protein calorie malnutrition -Continue tube feeds via PEG tube -SLP following 1/14 Starting nectar thick liquids today 1/15 Continue po and TF nutrition   DVT prophylaxis: Subcutaneous heparin Code Status: Full code Family Communication: His sister was updated over the phone on 1/13 Disposition Plan: Likely LTACH, CM following.  Status is: Inpatient  Remains inpatient appropriate  because:Inpatient level of care appropriate due to severity of illness   Dispo: The patient is from: Home  Anticipated d/c is to: will need SNF that manages trach pts v LTACH              Anticipated d/c date is: > 3 days              Patient currently is not medically stable to d/c.  Consultants:   PCCM, nephrology, palliative medicine   Procedures: Tracheostomy 12/23  PEG tube 12/30  Antimicrobials:    Subjective: He reports feeling better today, sitting up in bed, in NAD. Reports that he had a dry nasal mucus with some blood in it yesterday but has not had nasal congestion or bleeding today.   Objective: Vitals:   12/07/20 1132 12/07/20 1200 12/07/20 1235 12/07/20 1502  BP: 134/88 107/80  115/90  Pulse: (!) 124  (!) 106 (!) 110  Resp: (!) $RemoveB'24 20 18 19  'osQfHzAq$ Temp: 98.7 F (37.1 C)   97.8 F (36.6 C)  TempSrc: Oral   Oral  SpO2: 96%  96% 99%  Weight:      Height:        Intake/Output Summary (Last 24 hours) at 12/07/2020 1801 Last data filed at 12/07/2020 0518 Gross per 24 hour  Intake -  Output 800 ml  Net -800 ml   Filed Weights   11/19/20 0500 11/21/20 0447 11/22/20 0500  Weight: 108.3 kg 108.5 kg 110.9 kg    Examination:  General exam: Chronically ill middle-aged male, Sitting in bed, in NAD.    HEENT: Trach collar in place CVS: S1-S2 present, mild tachycarda Lungs: No wheezing, no rhonchi Abdomen: Soft, nontender Extremities: No edema Neuro: Significant upper and lower extremity weakness Psych: Appropriate mood and affect, reported as improved  Data Reviewed:   CBC: Recent Labs  Lab 12/01/20 0131 12/02/20 0625 12/03/20 0217 12/05/20 0143  WBC 9.0 9.2 8.3 7.1  HGB 9.1* 9.2* 9.0* 9.4*  HCT 30.5* 31.5* 28.0* 30.9*  MCV 108.9* 109.4* 104.9* 108.0*  PLT 213 212 213 621   Basic Metabolic Panel: Recent Labs  Lab 12/02/20 0625 12/03/20 0217 12/04/20 0601 12/05/20 0143 12/07/20 0146  NA 154* 150* 151* 147* 146*  K 4.5 4.5 4.7 4.6  4.5  CL 122* 121* 118* 116* 113*  CO2 20* 20* 22 21* 23  GLUCOSE 152* 132* 124* 109* 91  BUN 80* 81* 78* 69* 62*  CREATININE 1.43* 1.32* 1.19 1.13 1.13  CALCIUM 9.5 9.2 9.2 9.1 9.0  MG  --   --   --   --  2.1  PHOS  --   --   --   --  4.7*   GFR: Estimated Creatinine Clearance: 92.7 mL/min (by C-G formula based on SCr of 1.13 mg/dL). Liver Function Tests: Recent Labs  Lab 12/02/20 0625  AST 31  ALT 50*  ALKPHOS 83  BILITOT 0.3  PROT 6.3*  ALBUMIN 2.6*   No results for input(s): LIPASE, AMYLASE in the last 168 hours. No results for input(s): AMMONIA in the last 168 hours. Coagulation Profile: No results for input(s): INR, PROTIME in the last 168 hours. Cardiac Enzymes: No results for input(s): CKTOTAL, CKMB, CKMBINDEX, TROPONINI in the last 168 hours. BNP (last 3 results) No results for input(s): PROBNP in the last 8760 hours. HbA1C: No results for input(s): HGBA1C in the last 72 hours. CBG: Recent Labs  Lab 12/06/20 2310 12/07/20 0310 12/07/20 0715 12/07/20 1214 12/07/20 1635  GLUCAP 92 85 86 105* 138*   Lipid Profile: No results for input(s): CHOL, HDL, LDLCALC, TRIG, CHOLHDL, LDLDIRECT  in the last 72 hours. Thyroid Function Tests: No results for input(s): TSH, T4TOTAL, FREET4, T3FREE, THYROIDAB in the last 72 hours. Anemia Panel: No results for input(s): VITAMINB12, FOLATE, FERRITIN, TIBC, IRON, RETICCTPCT in the last 72 hours. Urine analysis:    Component Value Date/Time   COLORURINE YELLOW 11/02/2020 1054   APPEARANCEUR CLOUDY (A) 11/02/2020 1054   LABSPEC 1.012 11/02/2020 1054   PHURINE 5.0 11/02/2020 1054   GLUCOSEU NEGATIVE 11/02/2020 1054   HGBUR LARGE (A) 11/02/2020 1054   BILIRUBINUR NEGATIVE 11/02/2020 1054   [place] 11/02/2020 1054   PROTEINUR 30 (A) 11/02/2020 1054   NITRITE NEGATIVE 11/02/2020 1054   LEUKOCYTESUR TRACE (A) 11/02/2020 1054   Sepsis Labs: $RemoveBefo'@[email or web address]'[id number]$ (procalcitonin:4,lacticidven:4)  )No results found for this or  any previous visit (from the past 240 hour(s)).   Scheduled Meds: . chlorhexidine gluconate (MEDLINE KIT)  15 mL Mouth Rinse BID  . Chlorhexidine Gluconate Cloth  6 each Topical Q0600  . famotidine  20 mg Per Tube QHS  . feeding supplement (PROSource TF)  90 mL Per Tube TID  . free water  500 mL Per Tube Q4H  . heparin injection (subcutaneous)  5,000 Units Subcutaneous Q8H  . linagliptin  5 mg Per Tube Daily  . mouth rinse  15 mL Mouth Rinse 10 times per day  . QUEtiapine  50 mg Per Tube QHS  . sodium bicarbonate  650 mg Per Tube BID  . sodium chloride flush  10-40 mL Intracatheter Q12H   Continuous Infusions: . sodium chloride 10 mL/hr at 12/05/20 0600  . feeding supplement (OSMOLITE 1.5 CAL) 1,000 mL (12/07/20 1021)     LOS: 48 days    Time spent: 25 minutes  Blain Pais, MD [facility]  12/07/2020, 6:01 PM

## 2020-12-08 DIAGNOSIS — E440 Moderate protein-calorie malnutrition: Secondary | ICD-10-CM

## 2020-12-08 DIAGNOSIS — A419 Sepsis, unspecified organism: Secondary | ICD-10-CM

## 2020-12-08 DIAGNOSIS — R6521 Severe sepsis with septic shock: Secondary | ICD-10-CM

## 2020-12-08 LAB — GLUCOSE, CAPILLARY
Glucose-Capillary: 111 mg/dL — ABNORMAL HIGH (ref 70–99)
Glucose-Capillary: 115 mg/dL — ABNORMAL HIGH (ref 70–99)
Glucose-Capillary: 124 mg/dL — ABNORMAL HIGH (ref 70–99)
Glucose-Capillary: 128 mg/dL — ABNORMAL HIGH (ref 70–99)
Glucose-Capillary: 92 mg/dL (ref 70–99)
Glucose-Capillary: 95 mg/dL (ref 70–99)

## 2020-12-08 MED ORDER — DOCUSATE SODIUM 50 MG/5ML PO LIQD
50.0000 mg | Freq: Two times a day (BID) | ORAL | Status: DC
  Administered 2020-12-09 – 2020-12-13 (×8): 50 mg
  Filled 2020-12-08 (×9): qty 10

## 2020-12-08 NOTE — Progress Notes (Signed)
PROGRESS NOTE    Jackson Figueroa  OQH:[id number] DOB: [birth year] DOA: 10/20/2020 PCP: Patient, No Pcp Per    Brief Narrative:  60 year old male with no significant past medical history was admitted to [facility] with on 11/28 with severe hypoxic respiratory failure, ARDS/Covid pneumonia, he tested positive on 11/26 PTA. -Intubated on arrival and transferred to [facility] long hospital 11/29 -Prolonged respiratory failure, eventually had a tracheostomy on 12/23 and PEG tube placed on 12/30 [facility] course also complicated by severe AKI due to ATN from septic shock and Covid, required CRRT from 12/16-12/26, subsequently started intermittent dialysis, last HD 12/28, HD catheter was removed on 1/4. 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, started on minocycline 12/21 -Transferred from PCCM to [facility] service  1/10 Awaiting Rehab placement, may need LTACH   Assessment & Plan:   Active Problems:   Acute respiratory failure due to COVID-19 [facility])   Acute respiratory distress syndrome (ARDS) due to COVID-19 virus (HCC)   Septic shock (HCC)   AKI (acute kidney injury) ([place])   Protein calorie malnutrition (HCC)   Acute metabolic encephalopathy   Ileus (HCC)   Acute respiratory failure with hypoxemia (HCC)   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  Clinically improved.  Stable on trach collar and now on 28% FiO2. Seen by speech, passed swallow evaluation.  Started on pure with nectar thick. Continue PEG tube feeding, optimized on current doses. Start mobility with PT OT.  Acute metabolic encephalopathy: Improved.  On low-dose Seroquel at night.  Gradually taper off.  Acute renal failure with hypernatremia: Improved.  Normalized.  MDR stenotrophomonas pneumonia: Completed antibiotic  therapy.  Critical illness neuropathy/severe deconditioning: Need aggressive rehab.  Pressure injuries of sacrum and toes, not present on admission: Local wound care.   DVT prophylaxis: heparin injection 5,000 Units Start: 11/21/20 2200   Code Status: Full code Family Communication: None Disposition Plan: Status is: Inpatient  Remains inpatient appropriate because:Unsafe d/c plan   Dispo: The patient is from: Home              Anticipated d/c is to: LTAC              Anticipated d/c date is: 2 days              Patient currently is medically stable to d/c.         Consultants:   PCCM  Nephrology  Palliative medicine  Procedures:   Tracheostomy 12/23  PEG tube placement 12/30  Intermittent hemodialysis and now off.  Antimicrobials:   Completed all antibiotics.   Subjective: Patient seen and examined.  No overnight events.  He feels weak overall but denies any complaints.  Patient is highly motivated and looking forward to go to rehab. He believes on holistic approach and different types of therapies like acupuncture and massage therapies. Patient wants to use some stool softener to avoid constipation.  No more diarrhea.  Objective: Vitals:   12/08/20 0011 12/08/20 0324 12/08/20 0848 12/08/20 0915  BP: 125/86 128/90 122/88   Pulse: (!) 116 (!) 104 (!) 102 89  Resp: 20 19 (!) 26 (!) 24  Temp: 97.8 F (36.6 C) 97.8 F (36.6 C) 98 F (36.7 C)   TempSrc: Oral Oral Oral   SpO2: 94% 94%  96%  Weight:      Height:  Intake/Output Summary (Last 24 hours) at 12/08/2020 1105 Last data filed at 12/08/2020 0530 Gross per 24 hour  Intake --  Output 500 ml  Net -500 ml   Filed Weights   11/19/20 0500 11/21/20 0447 11/22/20 0500  Weight: 108.3 kg 108.5 kg 110.9 kg    Examination:  General exam: Appears calm and comfortable  Chronically sick looking.  Not in any distress.  He is propped up in bed, able to have conversation. Respiratory system:  Clear to auscultation. Respiratory effort normal.  Trach collar in place.  Able to talk with PMV valve.  Some conducted airway sounds. Cardiovascular system: S1 & S2 heard, RRR. No JVD, murmurs, rubs, gallops or clicks. No pedal edema. Gastrointestinal system: Abdomen is nondistended, soft and nontender. No organomegaly or masses felt. Normal bowel sounds heard. PEG tube site clean and dry. Central nervous system: Alert and oriented. No focal neurological deficits. Extremities: Symmetric 5 x 5 power. Skin: No rashes, lesions or ulcers Psychiatry: Judgement and insight appear normal. Mood & affect appropriate.     Data Reviewed: I have personally reviewed following labs and imaging studies  CBC: Recent Labs  Lab 12/02/20 0625 12/03/20 0217 12/05/20 0143  WBC 9.2 8.3 7.1  HGB 9.2* 9.0* 9.4*  HCT 31.5* 28.0* 30.9*  MCV 109.4* 104.9* 108.0*  PLT 212 213 735   Basic Metabolic Panel: Recent Labs  Lab 12/02/20 0625 12/03/20 0217 12/04/20 0601 12/05/20 0143 12/07/20 0146  NA 154* 150* 151* 147* 146*  K 4.5 4.5 4.7 4.6 4.5  CL 122* 121* 118* 116* 113*  CO2 20* 20* 22 21* 23  GLUCOSE 152* 132* 124* 109* 91  BUN 80* 81* 78* 69* 62*  CREATININE 1.43* 1.32* 1.19 1.13 1.13  CALCIUM 9.5 9.2 9.2 9.1 9.0  MG  --   --   --   --  2.1  PHOS  --   --   --   --  4.7*   GFR: Estimated Creatinine Clearance: 92.7 mL/min (by C-G formula based on SCr of 1.13 mg/dL). Liver Function Tests: Recent Labs  Lab 12/02/20 0625  AST 31  ALT 50*  ALKPHOS 83  BILITOT 0.3  PROT 6.3*  ALBUMIN 2.6*   No results for input(s): LIPASE, AMYLASE in the last 168 hours. No results for input(s): AMMONIA in the last 168 hours. Coagulation Profile: No results for input(s): INR, PROTIME in the last 168 hours. Cardiac Enzymes: No results for input(s): CKTOTAL, CKMB, CKMBINDEX, TROPONINI in the last 168 hours. BNP (last 3 results) No results for input(s): PROBNP in the last 8760 hours. HbA1C: No results  for input(s): HGBA1C in the last 72 hours. CBG: Recent Labs  Lab 12/07/20 1635 12/07/20 2005 12/07/20 2315 12/08/20 0344 12/08/20 0850  GLUCAP 138* 135* 133* 92 115*   Lipid Profile: No results for input(s): CHOL, HDL, LDLCALC, TRIG, CHOLHDL, LDLDIRECT in the last 72 hours. Thyroid Function Tests: No results for input(s): TSH, T4TOTAL, FREET4, T3FREE, THYROIDAB in the last 72 hours. Anemia Panel: No results for input(s): VITAMINB12, FOLATE, FERRITIN, TIBC, IRON, RETICCTPCT in the last 72 hours. Sepsis Labs: No results for input(s): PROCALCITON, LATICACIDVEN in the last 168 hours.  No results found for this or any previous visit (from the past 240 hour(s)).       Radiology Studies: No results found.      Scheduled Meds: . chlorhexidine gluconate (MEDLINE KIT)  15 mL Mouth Rinse BID  . Chlorhexidine Gluconate Cloth  6 each Topical Q0600  .  docusate  50 mg Oral BID  . famotidine  20 mg Per Tube QHS  . feeding supplement (PROSource TF)  90 mL Per Tube TID  . free water  500 mL Per Tube Q4H  . heparin injection (subcutaneous)  5,000 Units Subcutaneous Q8H  . linagliptin  5 mg Per Tube Daily  . mouth rinse  15 mL Mouth Rinse 10 times per day  . QUEtiapine  50 mg Per Tube QHS  . sodium bicarbonate  650 mg Per Tube BID  . sodium chloride flush  10-40 mL Intracatheter Q12H   Continuous Infusions: . sodium chloride 10 mL/hr at 12/05/20 0600  . feeding supplement (OSMOLITE 1.5 CAL) 1,000 mL (12/08/20 0245)     LOS: 49 days    Time spent: 25 minutes    [name] Merino, MD [facility] Pager [phone]

## 2020-12-09 DIAGNOSIS — J9602 Acute respiratory failure with hypercapnia: Secondary | ICD-10-CM

## 2020-12-09 LAB — GLUCOSE, CAPILLARY
Glucose-Capillary: 102 mg/dL — ABNORMAL HIGH (ref 70–99)
Glucose-Capillary: 103 mg/dL — ABNORMAL HIGH (ref 70–99)
Glucose-Capillary: 104 mg/dL — ABNORMAL HIGH (ref 70–99)
Glucose-Capillary: 115 mg/dL — ABNORMAL HIGH (ref 70–99)
Glucose-Capillary: 70 mg/dL (ref 70–99)
Glucose-Capillary: 99 mg/dL (ref 70–99)

## 2020-12-09 NOTE — Progress Notes (Signed)
NAME:  Jackson Figueroa, MRN:  [id number], DOB:  [birth year], LOS: 28 ADMISSION DATE:  10/20/2020, CONSULTATION DATE:  11/29 REFERRING MD:  Sabra Heck, CHIEF COMPLAINT:  ARDS COVID 58   Brief History:  60 year old male with no significant past medical history and a positive COVID-19 test on 11/26 presented to [facility] with profound hypoxemia on 11/28. Intubated immediately upon arrival found to be in ARDS. Transferred to [facility] long hospital for ongoing ICU care  11/29.  Past Medical History:  Obesity   Significant Hospital Events:  11/28 Presented to [facility] ER, intubated for hypoxemia, empiric abx, shock on levo. COVID Rx 11/29 Persistent shock, central line placed, ARDS protocol continued, proning initiated 11/30 Pressor requirements continued, NMB initiated to facilitate ventilation tube feeds started, initiated proning 12/01 Stopping NMB, proning positioning protocol discontinued, free water for rising creatinine and sodium 12/02 PEEP trending down. Renal function improved. Changed to versed from prop gtt d/t rising triglycerides  12/03 Remdesivir completed. Peep/FIO2 stable. Free water adjusted. Solumedrol reduced, fentanyl to Dilaudid, adding Oxycodone 12/04 Dyssynchrony, given paralytic overnight, changed to ketamine; vomited / aspirated, restarted paralytics 12/06 Proned, under NMB 12/07 Proning, NMB continued. Switched antibiotics for stenotrophomonas 12/13 Stopped oxycodone, added dilaudid oral, continuous nimbex infusion to prn, palliative care consulted 12/14 Changed to pressure control 12/15 Worsening vent synchrony after clamping OG tube 12/18 added reglan IV for ? Ileus developing  12/18 back on paralytics for "guppy breathing/ air stacking" on PCV ? ,mucus plug  12/19  Back on PCV and off paralytics  12/20 having intermittent fevers, atrial fibrillation with some ST changes.  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 Started on minocycline per ID 12/22 stable decreased steroid dosing. Clonazepam added 12/23 trach placed. Versed off. Changed dilaudid from 4mg -.3mg , added clonidine and increased depakote. Added midodrine. OGT out during surg. Post-pyloric dislodged. Tip in stomach. Attempting trickle feeds w/ reglan  12/24 NG tube placed by IR 12/25 Dilaudid weaned to off, tolerated trach collar 12/26 tolerated weaning for about 4 hours on trach collar 12/30 G tube with IR 1/14 downsized to #6 cuffless 1/17 room air  Consults:   Nephrology 12/14 Palliative 12/13 ID signed off 12/17  Procedures:  ETT 11/28 >>trach 12/23 [place] CVL 11/29 >> 12/9 R PICC 12/9 >>  R IJ HD Cath 12/17 >> G tube 12/30>>  Significant Diagnostic Tests:   ECHO 11/29 >>LVEF 50-55%, mild LVH, RV systolic function mildly reduced. Mild dilation of ascending aorta 41mm. LE Venous Duplex 12/8 >> negative for DVT   Micro Data:   COVID 11/28 >> Positive  Influenza A/B 11/28 >> negative  UC 11/28 >> negative  BCx2 11/28 >> negative  Tracheal aspirate 12/5 >> Stenotrophomonas maltophilia >> S-bactrim, levofloxacin UC 12/11 >> negative  Tracheal aspirate 12/11 >> Stenotrophomonas maltophilia >> S-bactrim, levofloxacin  BCx2 12/11 >> negative  Trach asp 12/ 19 >>> multidrug resistance stenotrophomonas Tracheal stoma 12/28> MDR moderate strep mitis/oralis, rare steno  Antimicrobials:   CTX 11/28 > 11/29 Azithromycin 11/28 > 11/29 Remdesivir 11/28 > 12/2 Prednisone 11/28 >  Baricitinib 12/4 > 12/5 Vanco 12/5 >> 12/7, 12/11 x1 Cefepime 12/5 >> 12/7, 12/11 >> 12/13 Levaquin 12/7 >> 12/16 Minocycline 12/21>12/28, 12/31>>  1/13 Rocephin 12/31>>  Interim History / Subjective:  Awake alert and interactive PMV in place Swallowing Complains of R hand being  weak  Objective  Blood pressure 106/90, pulse (!) 107, temperature 98.7 F (37.1 C), temperature source Oral, resp. rate (!) 27, height 6' (1.829 m), weight 110.9 kg, SpO2 94 %.    FiO2 (%):  [21 %-28 %] 21 %   Intake/Output Summary (Last 24 hours) at 12/09/2020 1044 Last data filed at 12/09/2020 0421 Gross per 24 hour  Intake 2718.03 ml  Output 600 ml  Net 2118.03 ml   Filed Weights   11/19/20 0500 11/21/20 0447 11/22/20 0500  Weight: 108.3 kg 108.5 kg 110.9 kg    Examination:  General:  Resting comfortably in bed HENT: NCAT tracheostomy clear PULM: CTA B, normal effort CV: RRR, no mgr GI: BS+, soft, nontender MSK: able to move arms at shoulders, able to grab food some Neuro: awake, alert, no distress, [facility] Problem list   Oliguric AKI - improved. Renal signed off.   Assessment & Plan:  Acute hypoxemic and hypercapnic respiratory failure ARDS due to COVID-19 1/17 significant improvement Needs to maintain tracheostomy until he is up walking around Maintain #6 cuffless tracheostomy in place Tracheostomy care per routine PMV  Acute metabolic encephalopathy: improved Physical deconditioning Continue PT effort  MDR stenotrophomonas pneumonia > off minocycline Monitor off minocycline  Will see weekly  Best practice (evaluated daily)  Per TR[facility]   Roselie Awkward, MD [facility] Pager: [phone] Cell: [phone] If no response, call [phone]

## 2020-12-09 NOTE — Consult Note (Signed)
[place] Nurse wound follow up Patient receiving care in [facility] 2W23 Wound type:sacral DTI with MASD Wound bed: clean and pink over the buttocks;  3 areas on the buttocks appears to be from friction and shear with no drainage.  Toes are dry and stable.  Periwound: intact white from zinc based moisture barrier cream. Dressing procedure/placement/frequency: Continue zinc based moisture barrier cream (Desitin) to the buttocks; BID and PRN after each episode of incontinence WOC nurse will reassess on Thursday to confirm wounds in the buttock area are stable.   Monitor the wound area(s) for worsening of condition such as: Signs/symptoms of infection, increase in size, development of or worsening of odor, development of pain, or increased pain at the affected locations.   Notify the medical team if any of these develop.  Thank you for the consult. [place] nurse will follow weekly. Please re-consult the [place] team if needed.  Cathlean Marseilles Tamala Julian, MSN, RN, [place], Lysle Pearl, [facility] Wound Treatment Associate Pager [phone]

## 2020-12-09 NOTE — TOC Progression Note (Signed)
Transition of Care [facility]) - Progression Note    Patient Details  Name: Jackson Figueroa MRN: [id number] Date of Birth: June 15, 1961  Transition of Care [facility]) CM/SW [place], [place] Phone Number: 12/09/2020, 4:13 PM  Clinical Narrative:    CSW was notified by dr. Parks Ranger pt was now on room air and would be ready for SNF. He noted pt wanted a facility near [place]. CSW faxed to the appropriate facilities. Will provide bed offers when available.   Expected Discharge Plan: [place] Barriers to Discharge: Continued Medical Work up  Expected Discharge Plan and Services Expected Discharge Plan: [place]   Discharge Planning Services: CM Consult   Living arrangements for the past 2 months: Single Family Home                                       Social Determinants of Health (SDOH) Interventions    Readmission Risk Interventions No flowsheet data found.

## 2020-12-09 NOTE — Progress Notes (Signed)
Physical Therapy Treatment Patient Details Name: Jackson Figueroa MRN: [id number] DOB: 1961/07/30 Today's Date: 12/09/2020    History of Present Illness 60 yo admitted 11/28 with respiratory distress and unresponsive at home with Covid 19. Pt with AKI and HD dependent. CRRT 12/16-12/26. Trach 12/23. encephalopathy since admission. PEG 12/30.PMHx: obesity    PT Comments    Pt with improved bed mobility, sitting balance and standing tolerance today. Pt able to sit EOb without physical assist with good neck control and in 45 degree tilt maintain neck and trunk control. Pt fatigues quickly with quad activation at 45 degrees needing constant cues to contraction and fatigued after 6 min. Pt encouraged to continue to progress bil UE and LE use and mobility.  Pt on RA at 96% during session with HR max 124  15 degree tilt 0740 BP 12/87, HR 110 35 degree tilt 3 min, HR 118, BP 102/89 45 degree tilt 6 min, HR 110    Follow Up Recommendations  Supervision/Assistance - 24 hour;LTACH     Equipment Recommendations  Hospital bed;Wheelchair (measurements PT);Wheelchair cushion (measurements PT)    Recommendations for Other Services       Precautions / Restrictions Precautions Precautions: Fall Precaution Comments: PEG, trach    Mobility  Bed Mobility Overal bed mobility: Needs Assistance Bed Mobility: Rolling;Sidelying to Sit;Sit to Sidelying Rolling: Min assist Sidelying to sit: Max assist     Sit to sidelying: Mod assist General bed mobility comments: pt able to roll bil with assist only to bend knees and increased time. pt able to transition from side to sit with max assist to bring legs off bed and elevate trunk. Returning to sidelying pt able to control trunk to surface with physical assist to bring bil LE onto surface. Scooting toward [facility] mod assist with pt assisting by pushing with legs  Transfers Overall transfer level: Needs assistance     Sit to Stand: Total assist          General transfer comment: total assist of tilt bed to transition to 35 degree for 3 min then 45 degree tilt for 6 min  Ambulation/Gait                 Stairs             Wheelchair Mobility    Modified Rankin (Stroke Patients Only)       Balance Overall balance assessment: Needs assistance Sitting-balance support: No upper extremity supported;Feet supported Sitting balance-Leahy Scale: Fair Sitting balance - Comments: EOB 3 min minguard with cues for trunk extension                                    Cognition Arousal/Alertness: Awake/alert Behavior During Therapy: WFL for tasks assessed/performed Overall Cognitive Status: Impaired/Different from baseline                   Orientation Level: Disoriented to;Time             General Comments: pt with appropriate conversation, oriented other than to date and able to recognize and state physical limitations, PMSV used throughout      Exercises General Exercises - Lower Extremity Heel Slides: AAROM;Both;Supine;10 reps Mini-Sqauts: AROM;Both;10 reps;Standing (performed at 35 degrees and 45 degrees)    General Comments        Pertinent Vitals/Pain Pain Assessment: No/denies pain    Home Living  Prior Function            PT Goals (current goals can now be found in the care plan section) Progress towards PT goals: Progressing toward goals    Frequency    Min 3X/week      PT Plan Current plan remains appropriate    Co-evaluation              AM-PAC PT "6 Clicks" Mobility   Outcome Measure  Help needed turning from your back to your side while in a flat bed without using bedrails?: A Little Help needed moving from lying on your back to sitting on the side of a flat bed without using bedrails?: A Lot Help needed moving to and from a bed to a chair (including a wheelchair)?: Total Help needed standing up from a chair using your arms  (e.g., wheelchair or bedside chair)?: Total Help needed to walk in hospital room?: Total Help needed climbing 3-5 steps with a railing? : Total 6 Click Score: 9    End of Session   Activity Tolerance: Patient tolerated treatment well Patient left: in bed;with call bell/phone within reach Nurse Communication: Mobility status;Need for lift equipment PT Visit Diagnosis: Other abnormalities of gait and mobility (R26.89);Difficulty in walking, not elsewhere classified (R26.2);Other symptoms and signs involving the nervous system (R29.898);Muscle weakness (generalized) (M62.81)     Time: 9169-4503 PT Time Calculation (min) (ACUTE ONLY): 47 min  Charges:  $Therapeutic Exercise: 8-22 mins $Therapeutic Activity: 23-37 mins                     Ryann Pauli P, PT Acute Rehabilitation Services Pager: [phone] Office: [phone]    Amia Rynders B Sabrena Gavitt 12/09/2020, 11:03 AM

## 2020-12-09 NOTE — Progress Notes (Signed)
  Speech Language Pathology Treatment: Dysphagia  Patient Details Name: Jackson Figueroa MRN: [id number] DOB: [birth year] Today's Date: 12/09/2020 Time: 0277-4128 SLP Time Calculation (min) (ACUTE ONLY): 17 min  Assessment / Plan / Recommendation Clinical Impression  Pt was seen for dysphagia treatment. Floorstock purees and nectar thick liquids were recommended over the weekend. However, it appears that a dysphagia 1 diet with nectar thick liquids was initiated on 1/16 and that he has been receiving full meals. Pt reported that he has been tolerating meals pretty well and has been remembering to use the effortful swallow ~90% of the time. Per the pt, when he forgets he is still asymptomatic of aspiration "for the most part". Pt inconsistently exhibited coughing with dysphagia 2 solids and reported that "it feels like something is stuck".  Puree solids and nectar thick liquids were tolerated without overt s/sx of aspiration when an effortful swallow was implemented. Pt reported feeling fatigued following trials, but stated that this has been improving since 1/14. Pt's current diet of dysphagia 1 (puree) solids and thin liquids may be continued at this time. SLP will continue to follow pt.    HPI HPI: Pt is a 60 y.o. with PMH obesity who was admitted 11/28 with respiratory distress and unresponsive at home with Covid 19. Pt with AKI and HD dependent. CRRT 12/16-12/26. Intubated 11/28 until trach 12/23. Encephalopathy since admission. PEG 12/30. 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      SLP Plan  Continue with current plan of care       Recommendations  Diet recommendations: Nectar-thick liquid;Dysphagia 1 (puree) Liquids provided via: Cup;Straw Medication Administration: Via alternative means Compensations: Slow rate;Small sips/bites;Effortful swallow;Follow solids with liquid      Patient may use Passy-Muir Speech Valve:  During all waking hours (remove during sleep) PMSV Supervision: Intermittent MD: Please consider changing trach tube to : Smaller size;Cuffless         Oral Care Recommendations: Oral care BID Follow up Recommendations: Skilled Nursing facility SLP Visit Diagnosis: Dysphagia, unspecified (R13.10) Plan: Continue with current plan of care       Novi Calia I. Hardin Negus, [place], [place] Office number [phone] Pager [place] 12/09/2020, 1:04 PM

## 2020-12-09 NOTE — Progress Notes (Signed)
PROGRESS NOTE    Jackson Figueroa  DXA:[id number] DOB: 07/25/1961 DOA: 10/20/2020 PCP: Patient, No Pcp Per    Brief Narrative:  60 year old male with no significant past medical history was admitted to [facility] with on 11/28 with severe hypoxic respiratory failure, ARDS/Covid pneumonia, he tested positive on 11/26 PTA. Significant events, 11/29, intubated on arrival and transfer to [facility].  Prolonged respiratory failure. 12/23, eventual tracheostomy, gradually weaning and now almost on room air with trach. 12/30, PEG tube placed.  Tolerating tube feeding.  Also started eating dysphagia diet. 12/16 -20/94, course complicated by severe AKI due to ATN from septic shock and COVID, required CRRT and then intermittent hemodialysis.  Last hemodialysis on 12/28.  Hemodialysis catheter removed and now renal functions improved. 1/10, transferred to general medical bed. Medically stable and waiting for inpatient skilled nursing facility.   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  Clinically improved.  Stable on trach collar and now on room air. Seen by speech, passed swallow evaluation.  Started on pure with nectar thick. Continue PEG tube feeding, optimized on current doses. Start mobility with PT OT. Refer to inpatient therapies at a skilled nursing facility. Severely debilitated. Will continue tube feeding until he establishes good oral intake. Followed  by PCCM, they may change to cuffless trach since he is on room air now.  Acute metabolic encephalopathy: Improved.  On low-dose Seroquel at night.  Tolerating well.  We will continue until rehab then will need gradual tapering off.  Acute renal failure with hypernatremia: Improved.  Normalized.  Recheck renal functions and electrolytes tomorrow.  MDR stenotrophomonas pneumonia: Completed antibiotic therapy.  Critical illness neuropathy/severe deconditioning: Need aggressive rehab.  Pressure injuries of sacrum and toes, not present on admission: Local wound care.   DVT prophylaxis: heparin injection 5,000 Units Start: 11/21/20 2200   Code Status: Full code Family Communication: None Disposition Plan: Status is: Inpatient  Remains inpatient appropriate because:Unsafe d/c plan   Dispo: The patient is from: Home              Anticipated d/c is to: Skilled nursing facility.              Anticipated d/c date is: When bed is available.              Patient currently is medically stable to d/c.  Medically stable to DC to skilled level of care.         Consultants:   PCCM  Nephrology  Palliative medicine  Procedures:   Tracheostomy 12/23  PEG tube placement 12/30  Intermittent hemodialysis and now off.  Last dialysis on 12/28.  Antimicrobials:   Completed all antibiotics.   Subjective: Patient seen and examined.  No overnight events.  Highly motivated but very weak. Since last night he is on room air.  Able to eat food with thickened liquids.  Objective: Vitals:   12/08/20 2331 12/08/20 2353 12/09/20 0302 12/09/20 0712  BP: 124/87 96/69 109/70 106/90  Pulse: (!) 117 100 (!) 105 (!) 107  Resp: (!) _0 (!) 27  Temp:  98.9 F (37.2 C) 98.7 F (37.1 C)   TempSrc:   Oral   SpO2: 93% 96% 97% 94%  Weight:      Height:        Intake/Output Summary (Last 24 hours) at 12/09/2020 1046 Last data filed at 12/09/2020 0421 Gross per 24 hour  Intake 2718.03 ml   Output 600 ml  Net 2118.03 ml   Filed Weights   11/19/20 0500 11/21/20 0447 11/22/20 0500  Weight: 108.3 kg 108.5 kg 110.9 kg    Examination:  General exam: Appears calm and comfortable  Chronically sick looking.  Not in any distress.   Can have normal conversation without distress. Respiratory system: Clear to auscultation. Respiratory effort normal.  Trach collar in place.  Able to talk with PMV valve.  On room air today. Cardiovascular system: S1 & S2 heard, RRR. No JVD, murmurs, rubs, gallops or clicks. No pedal edema. Gastrointestinal system: Abdomen is nondistended, soft and nontender. No organomegaly or masses felt. Normal bowel sounds heard. PEG tube site clean and dry. Central nervous system: Alert and oriented. No focal neurological deficits. Profound generalized weakness. Psychiatry: Judgement and insight appear normal. Mood & affect appropriate.     Data Reviewed: I have personally reviewed following labs and imaging studies  CBC: Recent Labs  Lab 12/03/20 0217 12/05/20 0143  WBC 8.3 7.1  HGB 9.0* 9.4*  HCT 28.0* 30.9*  MCV 104.9* 108.0*  PLT 213 376   Basic Metabolic Panel: Recent Labs  Lab 12/03/20 0217 12/04/20 0601 12/05/20 0143 12/07/20 0146  NA 150* 151* 147* 146*  K 4.5 4.7 4.6 4.5  CL 121* 118* 116* 113*  CO2 20* 22 21* 23  GLUCOSE 132* 124* 109* 91  BUN 81* 78* 69* 62*  CREATININE 1.32* 1.19 1.13 1.13  CALCIUM 9.2 9.2 9.1 9.0  MG  --   --   --  2.1  PHOS  --   --   --  4.7*   GFR: Estimated Creatinine Clearance: 92.7 mL/min (by C-G formula based on SCr of 1.13 mg/dL). Liver Function Tests: No results for input(s): AST, ALT, ALKPHOS, BILITOT, PROT, ALBUMIN in the last 168 hours. No results for input(s): LIPASE, AMYLASE in the last 168 hours. No results for input(s): AMMONIA in the last 168 hours. Coagulation Profile: No results for input(s): INR, PROTIME in the last 168 hours. Cardiac Enzymes: No results for input(s): CKTOTAL, CKMB,  CKMBINDEX, TROPONINI in the last 168 hours. BNP (last 3 results) No results for input(s): PROBNP in the last 8760 hours. HbA1C: No results for input(s): HGBA1C in the last 72 hours. CBG: Recent Labs  Lab 12/08/20 1641 12/08/20 2041 12/08/20 2354 12/09/20 0351 12/09/20 0746  GLUCAP 95 128* 124* 115* 103*   Lipid Profile: No results for input(s): CHOL, HDL, LDLCALC, TRIG, CHOLHDL, LDLDIRECT in the last 72 hours. Thyroid Function Tests: No results for input(s): TSH, T4TOTAL, FREET4, T3FREE, THYROIDAB in the last 72 hours. Anemia Panel: No results for input(s): VITAMINB12, FOLATE, FERRITIN, TIBC, IRON, RETICCTPCT in the last 72 hours. Sepsis Labs: No results for input(s): PROCALCITON, LATICACIDVEN in the last 168 hours.  No results found for this or any previous visit (from the past 240 hour(s)).       Radiology Studies: No results found.      Scheduled Meds: . chlorhexidine gluconate (MEDLINE KIT)  15 mL Mouth Rinse BID  .  Chlorhexidine Gluconate Cloth  6 each Topical Q0600  . docusate  50 mg Per Tube BID  . famotidine  20 mg Per Tube QHS  . feeding supplement (PROSource TF)  90 mL Per Tube TID  . free water  500 mL Per Tube Q4H  . heparin injection (subcutaneous)  5,000 Units Subcutaneous Q8H  . linagliptin  5 mg Per Tube Daily  . mouth rinse  15 mL Mouth Rinse 10 times per day  . QUEtiapine  50 mg Per Tube QHS  . sodium bicarbonate  650 mg Per Tube BID  . sodium chloride flush  10-40 mL Intracatheter Q12H   Continuous Infusions: . sodium chloride 10 mL/hr at 12/05/20 0600  . feeding supplement (OSMOLITE 1.5 CAL) Stopped (12/09/20 0700)     LOS: 50 days    Time spent: 25 minutes    [name] Merino, MD [facility] Pager [phone]

## 2020-12-10 LAB — CBC WITH DIFFERENTIAL/PLATELET
Abs Immature Granulocytes: 0.15 10*3/uL — ABNORMAL HIGH (ref 0.00–0.07)
Basophils Absolute: 0 10*3/uL (ref 0.0–0.1)
Basophils Relative: 0 %
Eosinophils Absolute: 0.5 10*3/uL (ref 0.0–0.5)
Eosinophils Relative: 6 %
HCT: 32 % — ABNORMAL LOW (ref 39.0–52.0)
Hemoglobin: 10 g/dL — ABNORMAL LOW (ref 13.0–17.0)
Immature Granulocytes: 2 %
Lymphocytes Relative: 13 %
Lymphs Abs: 1.1 10*3/uL (ref 0.7–4.0)
MCH: 32.6 pg (ref 26.0–34.0)
MCHC: 31.3 g/dL (ref 30.0–36.0)
MCV: 104.2 fL — ABNORMAL HIGH (ref 80.0–100.0)
Monocytes Absolute: 0.8 10*3/uL (ref 0.1–1.0)
Monocytes Relative: 9 %
Neutro Abs: 5.7 10*3/uL (ref 1.7–7.7)
Neutrophils Relative %: 70 %
Platelets: 221 10*3/uL (ref 150–400)
RBC: 3.07 MIL/uL — ABNORMAL LOW (ref 4.22–5.81)
RDW: 15.2 % (ref 11.5–15.5)
WBC: 8.2 10*3/uL (ref 4.0–10.5)
nRBC: 0 % (ref 0.0–0.2)

## 2020-12-10 LAB — BASIC METABOLIC PANEL
Anion gap: 11 (ref 5–15)
BUN: 49 mg/dL — ABNORMAL HIGH (ref 6–20)
CO2: 23 mmol/L (ref 22–32)
Calcium: 8.8 mg/dL — ABNORMAL LOW (ref 8.9–10.3)
Chloride: 101 mmol/L (ref 98–111)
Creatinine, Ser: 1.01 mg/dL (ref 0.61–1.24)
GFR, Estimated: >60 mL/min (ref >60)
Glucose, Bld: 107 mg/dL — ABNORMAL HIGH (ref 70–99)
Potassium: 4.5 mmol/L (ref 3.5–5.1)
Sodium: 135 mmol/L (ref 135–145)

## 2020-12-10 LAB — GLUCOSE, CAPILLARY
Glucose-Capillary: 100 mg/dL — ABNORMAL HIGH (ref 70–99)
Glucose-Capillary: 106 mg/dL — ABNORMAL HIGH (ref 70–99)
Glucose-Capillary: 107 mg/dL — ABNORMAL HIGH (ref 70–99)
Glucose-Capillary: 119 mg/dL — ABNORMAL HIGH (ref 70–99)
Glucose-Capillary: 132 mg/dL — ABNORMAL HIGH (ref 70–99)
Glucose-Capillary: 97 mg/dL (ref 70–99)

## 2020-12-10 LAB — PHOSPHORUS: Phosphorus: 4.6 mg/dL (ref 2.5–4.6)

## 2020-12-10 LAB — MAGNESIUM: Magnesium: 1.8 mg/dL (ref 1.7–2.4)

## 2020-12-10 NOTE — Progress Notes (Signed)
PROGRESS NOTE    Jackson Figueroa  PFX:[id number] DOB: [birth year] DOA: 10/20/2020 PCP: Patient, No Pcp Per    Brief Narrative:  60 year old male with no significant past medical history was admitted to [facility] with on 11/28 with severe hypoxic respiratory failure, ARDS/Covid pneumonia, he tested positive on 11/26 PTA. Significant events, 11/29, intubated on arrival and transfer to [facility].  Prolonged respiratory failure. 12/23, eventual tracheostomy, gradually weaning and now almost on room air with trach. 12/30, PEG tube placed.  Tolerating tube feeding.  Also started eating dysphagia diet. 12/16 -32/99, course complicated by severe AKI due to ATN from septic shock and COVID, required CRRT and then intermittent hemodialysis.  Last hemodialysis on 12/28.  Hemodialysis catheter removed and now renal functions improved. 1/10, transferred to general medical bed. 1/ 14, tracheostomy downsized to #6 cuffless. 1/17, remains on room air since then. Medically stable and waiting for inpatient skilled nursing facility.   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  Clinically improved.  Stable on trach collar and now on room air. Seen by speech, passed swallow evaluation.  Started on pure with nectar thick. Continue PEG tube feeding, optimized on current doses. Continue to work with PT OT. Refer to inpatient  therapies at a skilled nursing facility. Severely debilitated. Will continue tube feeding until he establishes good oral intake. Patient currently has cuffless trach, on room air and normal speech with PMV and eating nectar thick.  Acute metabolic encephalopathy: Improved.  On low-dose Seroquel at night.  Tolerating well.  We will continue until rehab then will need gradual tapering off.  Acute renal failure with hypernatremia: Improved.  Normalized.   MDR stenotrophomonas pneumonia: Completed antibiotic therapy.  Critical illness neuropathy/severe deconditioning: Needs aggressive rehab.  Pressure injuries of sacrum and toes, not present on admission: Local wound care.   DVT prophylaxis: heparin injection 5,000 Units Start: 11/21/20 2200   Code Status: Full code Family Communication: None Disposition Plan: Status is: Inpatient  Remains inpatient appropriate because:Unsafe d/c plan   Dispo: The patient is from: Home              Anticipated d/c is to: Skilled nursing facility.              Anticipated d/c date is: When bed is available.              Patient currently is medically stable to d/c.  Medically stable to DC to skilled level of care.    Consultants:   PCCM  Nephrology  Palliative medicine  Procedures:   Tracheostomy 12/23  PEG tube placement 12/30  Intermittent hemodialysis and now off.  Last dialysis on 12/28.  Antimicrobials:   Completed all antibiotics.   Subjective: Patient seen and examined.  No overnight events.  Today he is sitting in chair after more than a month.  He was able to hold his head.  He had some choking episode with taking big  pills last night otherwise able to swallow nectar thick without difficulty.  He is excited to get out of the hospital and wants some rehab at [place] area.  Objective: Vitals:   12/10/20 0351 12/10/20 0627 12/10/20 0737 12/10/20 0741  BP:  120/90  124/79  Pulse: (!) 111 (!) 110 (!) 111 (!) 107  Resp:  (!) 24 (!) 22 (!) 26 20  Temp:    98.6 F (37 C)  TempSrc:    Oral  SpO2:  98% 94% 94%  Weight:      Height:        Intake/Output Summary (Last 24 hours) at 12/10/2020 0937 Last data filed at 12/09/2020 1500 Gross per 24 hour  Intake 172.25 ml  Output --  Net 172.25 ml   Filed Weights   11/19/20 0500 11/21/20 0447 11/22/20 0500  Weight: 108.3 kg 108.5 kg 110.9 kg    Examination:  General exam: Appears calm and comfortable  Chronically sick looking.  Not in any distress.   Can have normal conversation without distress.  Profoundly debilitated. Respiratory system: Clear to auscultation. Respiratory effort normal.  Trach collar in place.  Able to talk with PMV valve.  On room air today. Cardiovascular system: S1 & S2 heard, RRR. No JVD, murmurs, rubs, gallops or clicks. No pedal edema. Gastrointestinal system: Abdomen is nondistended, soft and nontender. No organomegaly or masses felt. Normal bowel sounds heard. PEG tube site clean and dry. Central nervous system: Alert and oriented.  Profound generalized weakness. Psychiatry: Judgement and insight appear normal. Mood & affect appropriate.     Data Reviewed: I have personally reviewed following labs and imaging studies  CBC: Recent Labs  Lab 12/05/20 0143 12/10/20 0335  WBC 7.1 8.2  NEUTROABS  --  5.7  HGB 9.4* 10.0*  HCT 30.9* 32.0*  MCV 108.0* 104.2*  PLT 205 419   Basic Metabolic Panel: Recent Labs  Lab 12/04/20 0601 12/05/20 0143 12/07/20 0146 12/10/20 0335  NA 151* 147* 146* 135  K 4.7 4.6 4.5 4.5  CL 118* 116* 113* 101  CO2 22 21* 23 23  GLUCOSE 124* 109* 91 107*  BUN 78* 69* 62* 49*  CREATININE 1.19 1.13 1.13 1.01  CALCIUM 9.2 9.1 9.0 8.8*  MG  --   --  2.1 1.8  PHOS  --   --  4.7* 4.6   GFR: Estimated Creatinine Clearance: 103.8 mL/min (by C-G formula based on SCr of 1.01 mg/dL). Liver Function Tests: No results for input(s): AST, ALT, ALKPHOS, BILITOT, PROT, ALBUMIN in the last 168  hours. No results for input(s): LIPASE, AMYLASE in the last 168 hours. No results for input(s): AMMONIA in the last 168 hours. Coagulation Profile: No results for input(s): INR, PROTIME in the last 168 hours. Cardiac Enzymes: No results for input(s): CKTOTAL, CKMB, CKMBINDEX, TROPONINI in the last 168 hours. BNP (last 3 results) No results for input(s): PROBNP in the last 8760 hours. HbA1C: No results for input(s): HGBA1C in the last 72 hours. CBG: Recent Labs  Lab 12/09/20 1631 12/09/20 2037 12/09/20 2356 12/10/20 0348 12/10/20 0738  GLUCAP 70 99 102* 106* 132*   Lipid Profile: No results for input(s): CHOL, HDL, LDLCALC, TRIG, CHOLHDL, LDLDIRECT in the last 72 hours. Thyroid Function Tests: No results for input(s): TSH, T4TOTAL, FREET4, T3FREE, THYROIDAB in the last 72 hours. Anemia Panel: No results for input(s): VITAMINB12, FOLATE, FERRITIN, TIBC, IRON, RETICCTPCT in the last 72 hours. Sepsis Labs: No results for input(s): PROCALCITON, LATICACIDVEN in the last  168 hours.  No results found for this or any previous visit (from the past 240 hour(s)).       Radiology Studies: No results found.      Scheduled Meds: . chlorhexidine gluconate (MEDLINE KIT)  15 mL Mouth Rinse BID  . Chlorhexidine Gluconate Cloth  6 each Topical Q0600  . docusate  50 mg Per Tube BID  . famotidine  20 mg Per Tube QHS  . feeding supplement (PROSource TF)  90 mL Per Tube TID  . free water  500 mL Per Tube Q4H  . heparin injection (subcutaneous)  5,000 Units Subcutaneous Q8H  . linagliptin  5 mg Per Tube Daily  . mouth rinse  15 mL Mouth Rinse 10 times per day  . QUEtiapine  50 mg Per Tube QHS  . sodium bicarbonate  650 mg Per Tube BID  . sodium chloride flush  10-40 mL Intracatheter Q12H   Continuous Infusions: . sodium chloride 10 mL/hr at 12/05/20 0600  . feeding supplement (OSMOLITE 1.5 CAL) 1,000 mL (12/09/20 1633)     LOS: 51 days    Time spent: 25 minutes    [name] Merino, MD [facility] Pager [phone]

## 2020-12-10 NOTE — Progress Notes (Signed)
Physical Therapy Treatment Patient Details Name: Jackson Figueroa MRN: [id number] DOB: 05/24/1961 Today's Date: 12/10/2020    History of Present Illness 60 yo admitted 11/28 with respiratory distress and unresponsive at home with Covid 19. Pt with AKI and HD dependent. CRRT 12/16-12/26. Trach 12/23. encephalopathy since admission. PEG 12/30.PMHx: obesity    PT Comments    Pt pleasant with improved cognition and strength. Pt able to perform rolling, transition to sitting and sit EOB without physical assist. Pt assisted to rise from surface with max +2 assist but unable to fully stand and will continue to benefit from tilting with staff and progressing bil LE strength to be able to fully stand from EOB. Pt with progressing bil UE and grip strength noted as well. Pt in chair end of session on pillows with lift pad and nursing staff aware of need for lift back to bed. Will continue to follow and progress function.   HR 123 SpO2 96% on RA    Follow Up Recommendations  Supervision/Assistance - 24 hour;LTACH     Equipment Recommendations  Hospital bed;Wheelchair (measurements PT);Wheelchair cushion (measurements PT);Other (comment) (hoyer lift)    Recommendations for Other Services       Precautions / Restrictions Precautions Precautions: Fall Precaution Comments: PEG, trach, sacral wounds Restrictions Weight Bearing Restrictions: No    Mobility  Bed Mobility Overal bed mobility: Needs Assistance Bed Mobility: Rolling;Sidelying to Sit Rolling: Min assist Sidelying to sit: Max assist       General bed mobility comments: pt able to roll bil with assist only to bend knees and increased time. pt able to transition from side to sit with max assist to elevate trunk, pt able to shift legs off bed with increased time. Max +2 to scoot at EOB to position off rail. EOB grossly 8 min without physical assist  Transfers Overall transfer level: Needs assistance     Sit to Stand: Max assist;+2  physical assistance Stand pivot transfers: Max assist;+2 physical assistance       General transfer comment: max +2 to rise from bed x 2 with bil knees blocked, pad and cues for sequence. Pt able to clear sacrum but could not fully extend knees. Max +2 with pad to pivot to chair  Ambulation/Gait             General Gait Details: unable   Stairs             Wheelchair Mobility    Modified Rankin (Stroke Patients Only)       Balance Overall balance assessment: Needs assistance Sitting-balance support: No upper extremity supported;Feet supported Sitting balance-Leahy Scale: Fair Sitting balance - Comments: EOB 8 min with guarding for safety     Standing balance-Leahy Scale: Zero Standing balance comment: bil UE support and max +2 assist                            Cognition Arousal/Alertness: Awake/alert Behavior During Therapy: WFL for tasks assessed/performed Overall Cognitive Status: Impaired/Different from baseline Area of Impairment: Safety/judgement                   Current Attention Level: Selective   Following Commands: Follows one step commands with increased time Safety/Judgement: Decreased awareness of deficits   Problem Solving: Requires verbal cues General Comments: pt oriented and appropriate for conversation today with improved command following and awareness      Exercises General Exercises - Lower Extremity Long Arc  Quad: AROM;Both;Seated;15 reps Hip Flexion/Marching: AAROM;Both;Seated;15 reps    General Comments        Pertinent Vitals/Pain Pain Score: 5  Pain Location: sacrum in sitting, repositioned with pillows Pain Descriptors / Indicators: Aching;Sore Pain Intervention(s): Limited activity within patient's tolerance;Repositioned    Home Living                      Prior Function            PT Goals (current goals can now be found in the care plan section) Progress towards PT goals:  Progressing toward goals    Frequency    Min 3X/week      PT Plan Current plan remains appropriate    Co-evaluation              AM-PAC PT "6 Clicks" Mobility   Outcome Measure  Help needed turning from your back to your side while in a flat bed without using bedrails?: A Little Help needed moving from lying on your back to sitting on the side of a flat bed without using bedrails?: A Lot Help needed moving to and from a bed to a chair (including a wheelchair)?: Total Help needed standing up from a chair using your arms (e.g., wheelchair or bedside chair)?: Total Help needed to walk in hospital room?: Total Help needed climbing 3-5 steps with a railing? : Total 6 Click Score: 9    End of Session   Activity Tolerance: Patient tolerated treatment well Patient left: in chair;with call bell/phone within reach;with chair alarm set Nurse Communication: Mobility status;Need for lift equipment PT Visit Diagnosis: Other abnormalities of gait and mobility (R26.89);Difficulty in walking, not elsewhere classified (R26.2);Other symptoms and signs involving the nervous system (R29.898);Muscle weakness (generalized) (M62.81)     Time: 4765-4650 PT Time Calculation (min) (ACUTE ONLY): 35 min  Charges:  $Therapeutic Exercise: 8-22 mins $Therapeutic Activity: 8-22 mins                     Anaih Brander P, PT Acute Rehabilitation Services Pager: [phone] Office: [place]ling 12/10/2020, 10:17 AM

## 2020-12-11 LAB — GLUCOSE, CAPILLARY
Glucose-Capillary: 104 mg/dL — ABNORMAL HIGH (ref 70–99)
Glucose-Capillary: 113 mg/dL — ABNORMAL HIGH (ref 70–99)
Glucose-Capillary: 103 mg/dL — ABNORMAL HIGH (ref 70–99)
Glucose-Capillary: 118 mg/dL — ABNORMAL HIGH (ref 70–99)
Glucose-Capillary: 143 mg/dL — ABNORMAL HIGH (ref 70–99)

## 2020-12-11 LAB — TSH: TSH: 2.027 u[IU]/mL (ref 0.350–4.500)

## 2020-12-11 NOTE — Progress Notes (Signed)
PROGRESS NOTE    Jackson Figueroa  IOX:[id number] DOB: May 16, 1961 DOA: 10/20/2020 PCP: Patient, No Pcp Per    Brief Narrative:  60 year old male with no significant past medical history was admitted to [facility] with on 11/28 with severe hypoxic respiratory failure, ARDS/Covid pneumonia, he tested positive on 11/26 PTA. Significant events, 11/29, intubated on arrival and transfer to [facility].  Prolonged respiratory failure. 12/23, eventual tracheostomy, gradually weaning and now almost on room air with trach. 12/30, PEG tube placed.  Tolerating tube feeding.  Also started eating dysphagia diet. 12/16 -26/83, course complicated by severe AKI due to ATN from septic shock and COVID, required CRRT and then intermittent hemodialysis.  Last hemodialysis on 12/28.  Hemodialysis catheter removed and now renal functions improved. 1/10, transferred to general medical bed. 1/ 14, tracheostomy downsized to #6 cuffless. 1/17, remains on room air since then. Medically stable and waiting for inpatient skilled nursing facility.   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  Clinically improved.  Stable on trach collar and now on room air. Seen by speech, passed swallow evaluation.  Started on pure with nectar thick. Continue PEG tube feeding, optimized on current doses. Continue to work with PT OT. Refer to inpatient  therapies at a skilled nursing facility. Severely debilitated. Will continue tube feeding until he establishes good oral intake. Patient currently has cuffless trach, on room air and normal speech with PMV and eating nectar thick.  Acute metabolic encephalopathy: Improved.  On low-dose Seroquel at night.  Tolerating well.  We will continue until rehab then will need gradual tapering off.  Acute renal failure with hypernatremia: Improved.  Normalized.   MDR stenotrophomonas pneumonia: Completed antibiotic therapy.  Critical illness neuropathy/severe deconditioning: Needs aggressive rehab. He has sinus tachycardia, will check TSH.  Currently no indication for treatment.  Asymptomatic.  Pressure injuries of sacrum and toes, not present on admission: Local wound care.   DVT prophylaxis: heparin injection 5,000 Units Start: 11/21/20 2200   Code Status: Full code Family Communication: None Disposition Plan: Status is: Inpatient  Remains inpatient appropriate because:Unsafe d/c plan   Dispo: The patient is from: Home              Anticipated d/c is to: Skilled nursing facility.              Anticipated d/c date is: When bed is available.              Patient currently is medically stable to d/c.  Medically stable to DC to skilled level of care.    Consultants:   PCCM  Nephrology  Palliative medicine  Procedures:   Tracheostomy 12/23  PEG tube placement 12/30  Intermittent hemodialysis and now off.  Last dialysis on 12/28.  Antimicrobials:   Completed all antibiotics.   Subjective: Patient seen and examined.  Denies any complaints.  No other overnight events.  Objective: Vitals:   12/11/20 0235 12/11/20 0329  12/11/20 0907 12/11/20 1252  BP: 110/80  121/84 117/78  Pulse: (!) 113 (!) 114 (!) 113 (!) 114  Resp: $Remo'20 20 18 19  'XWNVp$ Temp: 97.7 F (36.5 C)  98.6 F (37 C) 98.6 F (37 C)  TempSrc: Oral  Oral Oral  SpO2: 98% 96% 96% 96%  Weight:      Height:       No  intake or output data in the 24 hours ending 12/11/20 1319 Filed Weights   11/19/20 0500 11/21/20 0447 11/22/20 0500  Weight: 108.3 kg 108.5 kg 110.9 kg    Examination:  Physical Exam Constitutional:      Appearance: Normal appearance.     Comments: Pretty debilitated gentleman.  On room air.  HENT:     Head: Normocephalic.     Comments: Trach collar, on room air. Cardiovascular:     Rate and Rhythm: Regular rhythm.  Pulmonary:     Effort: Pulmonary effort is normal.     Breath sounds: Normal breath sounds.  Abdominal:     Comments:  PEG tube site clean and dry.  Tube feeding infusing   Neurological:     Mental Status: He is alert and oriented to person, place, and time.     Comments: Generalized weakness.       Data Reviewed: I have personally reviewed following labs and imaging studies  CBC: Recent Labs  Lab 12/05/20 0143 12/10/20 0335  WBC 7.1 8.2  NEUTROABS  --  5.7  HGB 9.4* 10.0*  HCT 30.9* 32.0*  MCV 108.0* 104.2*  PLT 205 846   Basic Metabolic Panel: Recent Labs  Lab 12/05/20 0143 12/07/20 0146 12/10/20 0335  NA 147* 146* 135  K 4.6 4.5 4.5  CL 116* 113* 101  CO2 21* 23 23  GLUCOSE 109* 91 107*  BUN 69* 62* 49*  CREATININE 1.13 1.13 1.01  CALCIUM 9.1 9.0 8.8*  MG  --  2.1 1.8  PHOS  --  4.7* 4.6   GFR: Estimated Creatinine Clearance: 103.8 mL/min (by C-G formula based on SCr of 1.01 mg/dL). Liver Function Tests: No results for input(s): AST, ALT, ALKPHOS, BILITOT, PROT, ALBUMIN in the last 168 hours. No results for input(s): LIPASE, AMYLASE in the last 168 hours. No results for input(s): AMMONIA in the last 168 hours. Coagulation Profile: No results for input(s): INR, PROTIME in the last 168 hours. Cardiac Enzymes: No results for input(s): CKTOTAL, CKMB, CKMBINDEX, TROPONINI in the last 168 hours. BNP (last 3 results) No results for input(s): PROBNP in the last 8760 hours. HbA1C: No results for input(s): HGBA1C in the last 72  hours. CBG: Recent Labs  Lab 12/10/20 2028 12/10/20 2354 12/11/20 0240 12/11/20 0905 12/11/20 1250  GLUCAP 97 107* 104* 113* 103*   Lipid Profile: No results for input(s): CHOL, HDL, LDLCALC, TRIG, CHOLHDL, LDLDIRECT in the last 72 hours. Thyroid Function Tests: No results for input(s): TSH, T4TOTAL, FREET4, T3FREE, THYROIDAB in the last 72 hours. Anemia Panel: No results for input(s): VITAMINB12, FOLATE, FERRITIN, TIBC, IRON, RETICCTPCT in the last 72 hours. Sepsis Labs: No results for input(s): PROCALCITON, LATICACIDVEN in the last 168 hours.  No results found for this or any previous visit (from the past 240 hour(s)).       Radiology Studies: No results found.      Scheduled Meds: . chlorhexidine gluconate (MEDLINE KIT)  15 mL Mouth Rinse BID  . Chlorhexidine Gluconate Cloth  6 each Topical Q0600  . docusate  50 mg Per Tube  BID  . famotidine  20 mg Per Tube QHS  . feeding supplement (PROSource TF)  90 mL Per Tube TID  . free water  500 mL Per Tube Q4H  . heparin injection (subcutaneous)  5,000 Units Subcutaneous Q8H  . linagliptin  5 mg Per Tube Daily  . mouth rinse  15 mL Mouth Rinse 10 times per day  . QUEtiapine  50 mg Per Tube QHS  . sodium bicarbonate  650 mg Per Tube BID  . sodium chloride flush  10-40 mL Intracatheter Q12H   Continuous Infusions: . sodium chloride 10 mL/hr at 12/05/20 0600  . feeding supplement (OSMOLITE 1.5 CAL) 1,000 mL (12/09/20 1633)     LOS: 52 days    Time spent: 25 minutes    [name] Merino, MD [facility] Pager [phone]

## 2020-12-11 NOTE — Progress Notes (Signed)
Occupational Therapy Treatment Patient Details Name: Jackson Figueroa MRN: [id number] DOB: 1961-08-17 Today's Date: 12/11/2020    History of present illness 60 yo admitted 11/28 with respiratory distress and unresponsive at home with Covid 19. Pt with AKI and HD dependent. CRRT 12/16-12/26. Trach 12/23. encephalopathy since admission. PEG 12/30.PMHx: obesity   OT comments  Pt with gradual progress towards OT goals, remains motivated to improve functional abilities. Pt remains limited by decreased endurance and muscle atrophy requiring +2 physical assistance for bed mobility and standing attempts. Pt able to demonstrate unsupported sitting balance intermittently but fatigues quickly. Pt is also demonstrating improved UE strength and muscle activation in upper arm. Educated on manual resistance, stretching and coordination tasks that pt can complete with wife's assistance. Plan to compile relevant HEPs and provide instruction during next session.    Follow Up Recommendations  LTACH;SNF;Supervision/Assistance - 24 hour    Equipment Recommendations  Hospital bed;Wheelchair (measurements OT);Wheelchair cushion (measurements OT)    Recommendations for Other Services      Precautions / Restrictions Precautions Precautions: Fall Precaution Comments: PEG, trach, sacral wounds Restrictions Weight Bearing Restrictions: No       Mobility Bed Mobility Overal bed mobility: Needs Assistance Bed Mobility: Rolling;Sit to Supine;Sidelying to Sit Rolling: Mod assist (for LE management) Sidelying to sit: Max assist;+2 for physical assistance   Sit to supine: Max assist;+2 for physical assistance      Transfers Overall transfer level: Needs assistance Equipment used: 2 person hand held assist Transfers: Sit to/from Stand Sit to Stand: Max assist;+2 physical assistance;From elevated surface         General transfer comment: PT/OT provide bilateral knee block and use of chuck pad to assist in hip  extension. Pt remains unable to bring trunk into full extension but does clear buttocks from bed    Balance Overall balance assessment: Needs assistance Sitting-balance support: Single extremity supported;Bilateral upper extremity supported;Feet supported Sitting balance-Leahy Scale: Poor Sitting balance - Comments: reliant on UE support of minA   Standing balance support: Bilateral upper extremity supported Standing balance-Leahy Scale: Zero Standing balance comment: totalA x2                           ADL either performed or assessed with clinical judgement   ADL Overall ADL's : Needs assistance/impaired   Eating/Feeding Details (indicate cue type and reason): able to demo hand to mouth motion though difficulty sustaining due to weakness/fatigue                                   General ADL Comments: Session focused on core control, transfer attempts and UE HEP beginning instruction. Pt reports improving ability to use phone- able to pay electric bill without assist on phone today     Vision   Vision Assessment?: No apparent visual deficits   Perception     Praxis      Cognition Arousal/Alertness: Awake/alert Behavior During Therapy: WFL for tasks assessed/performed Overall Cognitive Status: Impaired/Different from baseline Area of Impairment: Safety/judgement                         Safety/Judgement: Decreased awareness of deficits;Decreased awareness of safety     General Comments: A&Ox4, very pleasant and motivated to improve strength. Some decreased awareness of safety with lines, skin integrity but overall functional cognition  Exercises     Shoulder Instructions       General Comments 120s sitting EOB, 130s with standing attempts, O2 97% on RA. Guided pt in manual resistance exercises, stretching techniques and coordination tasks (finger to nose to finger) to assist with strength, coordination, endurance     Pertinent Vitals/ Pain       Pain Assessment: Faces Faces Pain Scale: Hurts even more Pain Location: back in sitting Pain Descriptors / Indicators: Grimacing Pain Intervention(s): Monitored during session;Repositioned  Home Living                                          Prior Functioning/Environment              Frequency  Min 2X/week        Progress Toward Goals  OT Goals(current goals can now be found in the care plan section)  Progress towards OT goals: Progressing toward goals  Acute Rehab OT Goals Patient Stated Goal: increase strength OT Goal Formulation: With patient/family Time For Goal Achievement: 12/20/20 Potential to Achieve Goals: Good ADL Goals Pt Will Perform Grooming: with supervision;bed level;sitting Pt Will Perform Upper Body Bathing: with min assist;bed level;sitting Pt/caregiver will Perform Home Exercise Program: Increased strength;Both right and left upper extremity;With minimal assist;With written HEP provided Additional ADL Goal #1: Pt will tolerate 20 minutes in semi-standing position with tilt bed at ~55* Additional ADL Goal #2: Pt will perform bed mobility with Mod A +2 in preapration for ADLs  Plan Discharge plan remains appropriate;Frequency remains appropriate    Co-evaluation    PT/OT/SLP Co-Evaluation/Treatment: Yes Reason for Co-Treatment: Complexity of the patient's impairments (multi-system involvement);For patient/therapist safety;To address functional/ADL transfers PT goals addressed during session: Mobility/safety with mobility;Strengthening/ROM;Balance OT goals addressed during session: ADL's and self-care;Strengthening/ROM      AM-PAC OT "6 Clicks" Daily Activity     Outcome Measure   Help from another person eating meals?: A Lot Help from another person taking care of personal grooming?: A Lot Help from another person toileting, which includes using toliet, bedpan, or urinal?: Total Help from  another person bathing (including washing, rinsing, drying)?: Total Help from another person to put on and taking off regular upper body clothing?: Total Help from another person to put on and taking off regular lower body clothing?: Total 6 Click Score: 8    End of Session Equipment Utilized During Treatment: Gait belt  OT Visit Diagnosis: Other abnormalities of gait and mobility (R26.89);Unsteadiness on feet (R26.81);Muscle weakness (generalized) (M62.81)   Activity Tolerance Patient tolerated treatment well   Patient Left in bed;with call bell/phone within reach;Other (comment) (in sidelying)   Nurse Communication Mobility status        Time: 1049-1130 OT Time Calculation (min): 41 min  Charges: OT General Charges $OT Visit: 1 Visit OT Treatments $Therapeutic Activity: 8-22 mins  Layla Maw, OTR/L   Layla Maw 12/11/2020, 12:39 PM

## 2020-12-11 NOTE — Progress Notes (Signed)
Physical Therapy Treatment Patient Details Name: Jackson Figueroa MRN: [id number] DOB: January 13, 1961 Today's Date: 12/11/2020    History of Present Illness 60 yo admitted 11/28 with respiratory distress and unresponsive at home with Covid 19. Pt with AKI and HD dependent. CRRT 12/16-12/26. Trach 12/23. encephalopathy since admission. PEG 12/30.PMHx: obesity    PT Comments    Pt remains generally weak but does demonstrate some improved use of UEs this session. Pt able to tolerate brief periods of sitting without physical assistance but fatigues quickly. Pt continues to require 2 person assist to attempt standing or transfers due to significant LE muscle wasting. Pt will benefit from continued acute PT POC and aggressive mobilization to improve strength and activity tolerance while also reducing caregiver burden.  Follow Up Recommendations  SNF;LTACH (pending medical necessity)     Equipment Recommendations  Hospital bed;Wheelchair (measurements PT);Wheelchair cushion (measurements PT);Other (comment) (mechanical lift)    Recommendations for Other Services       Precautions / Restrictions Precautions Precautions: Fall Precaution Comments: PEG, trach, sacral wounds Restrictions Weight Bearing Restrictions: No    Mobility  Bed Mobility Overal bed mobility: Needs Assistance Bed Mobility: Rolling;Sit to Supine;Sidelying to Sit Rolling: Mod assist (for LE management) Sidelying to sit: Max assist;+2 for physical assistance   Sit to supine: Max assist;+2 for physical assistance      Transfers Overall transfer level: Needs assistance Equipment used: 2 person hand held assist Transfers: Sit to/from Stand Sit to Stand: Max assist;+2 physical assistance;From elevated surface         General transfer comment: PT/OT provide bilateral knee block and use of chuck pad to assist in hip extension. Pt remains unable to bring trunk into full extension but does clear buttocks from  bed  Ambulation/Gait                 Stairs             Wheelchair Mobility    Modified Rankin (Stroke Patients Only)       Balance Overall balance assessment: Needs assistance Sitting-balance support: Single extremity supported;Bilateral upper extremity supported;Feet supported Sitting balance-Leahy Scale: Poor Sitting balance - Comments: reliant on UE support of minA   Standing balance support: Bilateral upper extremity supported Standing balance-Leahy Scale: Zero Standing balance comment: totalA x2                            Cognition Arousal/Alertness: Awake/alert Behavior During Therapy: WFL for tasks assessed/performed Overall Cognitive Status: Within Functional Limits for tasks assessed                                        Exercises      General Comments General comments (skin integrity, edema, etc.): pt tachy up to 136 with mobility, 110s for HR when in supine      Pertinent Vitals/Pain Pain Assessment: Faces Faces Pain Scale: Hurts even more Pain Location: back in sitting Pain Descriptors / Indicators: Grimacing Pain Intervention(s): Monitored during session    Home Living                      Prior Function            PT Goals (current goals can now be found in the care plan section) Acute Rehab PT Goals Patient Stated Goal: increase strength Progress towards  PT goals: Progressing toward goals (very slowly, some improvement in UE strength)    Frequency    Min 3X/week      PT Plan Current plan remains appropriate    Co-evaluation PT/OT/SLP Co-Evaluation/Treatment: Yes Reason for Co-Treatment: Complexity of the patient's impairments (multi-system involvement);For patient/therapist safety;To address functional/ADL transfers PT goals addressed during session: Mobility/safety with mobility;Strengthening/ROM;Balance        AM-PAC PT "6 Clicks" Mobility   Outcome Measure  Help needed  turning from your back to your side while in a flat bed without using bedrails?: A Lot Help needed moving from lying on your back to sitting on the side of a flat bed without using bedrails?: A Lot Help needed moving to and from a bed to a chair (including a wheelchair)?: Total Help needed standing up from a chair using your arms (e.g., wheelchair or bedside chair)?: A Lot Help needed to walk in hospital room?: Total Help needed climbing 3-5 steps with a railing? : Total 6 Click Score: 9    End of Session Equipment Utilized During Treatment: Gait belt Activity Tolerance: Patient tolerated treatment well Patient left: in bed;with call bell/phone within reach;with bed alarm set Nurse Communication: Mobility status;Need for lift equipment PT Visit Diagnosis: Other abnormalities of gait and mobility (R26.89);Difficulty in walking, not elsewhere classified (R26.2);Other symptoms and signs involving the nervous system (R29.898);Muscle weakness (generalized) (M62.81)     Time: 3267-1245 PT Time Calculation (min) (ACUTE ONLY): 43 min  Charges:  $Therapeutic Activity: 23-37 mins                     Zenaida Niece, PT, DPT Acute Rehabilitation Pager: [phone]    Zenaida Niece 12/11/2020, 12:09 PM

## 2020-12-12 LAB — GLUCOSE, CAPILLARY
Glucose-Capillary: 87 mg/dL (ref 70–99)
Glucose-Capillary: 89 mg/dL (ref 70–99)
Glucose-Capillary: 93 mg/dL (ref 70–99)
Glucose-Capillary: 94 mg/dL (ref 70–99)
Glucose-Capillary: 98 mg/dL (ref 70–99)

## 2020-12-12 MED ORDER — ENSURE ENLIVE PO LIQD
237.0000 mL | Freq: Three times a day (TID) | ORAL | Status: DC
  Administered 2020-12-13 – 2020-12-18 (×16): 237 mL via ORAL

## 2020-12-12 MED ORDER — METOPROLOL TARTRATE 12.5 MG HALF TABLET
12.5000 mg | ORAL_TABLET | Freq: Two times a day (BID) | ORAL | Status: DC
  Administered 2020-12-12 – 2020-12-18 (×13): 12.5 mg via ORAL
  Filled 2020-12-12 (×13): qty 1

## 2020-12-12 MED ORDER — ORAL CARE MOUTH RINSE
15.0000 mL | Freq: Two times a day (BID) | OROMUCOSAL | Status: DC
  Administered 2020-12-13 – 2020-12-18 (×10): 15 mL via OROMUCOSAL

## 2020-12-12 MED ORDER — ADULT MULTIVITAMIN W/MINERALS CH
1.0000 | ORAL_TABLET | Freq: Every day | ORAL | Status: DC
  Administered 2020-12-13 – 2020-12-18 (×6): 1 via ORAL
  Filled 2020-12-12 (×7): qty 1

## 2020-12-12 NOTE — Progress Notes (Signed)
  Speech Language Pathology Treatment: Dysphagia  Patient Details Name: Jackson Figueroa MRN: [id number] DOB: 1960-12-29 Today's Date: 12/12/2020 Time: 1478-2956 SLP Time Calculation (min) (ACUTE ONLY): 22 min  Assessment / Plan / Recommendation Clinical Impression  Pt was seen for dysphagia treatment. He was alert and cooperative during the session. He denied any difficulty with p.o. intake, but described the food as "unpallatable". Pt stated that he has been using the effortful swallow "for the most part". Images from the modified barium swallow study were reviewed to reinforce the necessity for swallowing precautions in reducing aspiration risk and he verbalized understanding. Pt tolerated puree solids, nectar thick liquids, and thin liquids via cup without overt s/sx of aspiration. He inconsistently exhibited throat clearing/coughing with dysphagia 3 solids, regular texture solids, and with thin liquids via straw, suggesting possible aspiration. Pt's diet will be advanced to dysphagia 2 solids and thin liquids via cup. SLP will continue to follow pt.    HPI HPI: Pt is a 60 y.o. with PMH obesity who was admitted 11/28 with respiratory distress and unresponsive at home with Covid 19. Pt with AKI and HD dependent. CRRT 12/16-12/26. Intubated 11/28 until trach 12/23. Encephalopathy since admission. PEG 12/30. 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      SLP Plan  Continue with current plan of care       Recommendations  Diet recommendations: Dysphagia 2 (fine chop);Thin liquid Liquids provided via: Cup;Straw Medication Administration: Whole meds with puree Supervision: Staff to assist with self feeding Compensations: Slow rate;Small sips/bites;Effortful swallow;Follow solids with liquid      Patient may use Passy-Muir Speech Valve: During all waking hours (remove during sleep) PMSV Supervision: Intermittent MD: Please  consider changing trach tube to : Smaller size;Cuffless         Oral Care Recommendations: Oral care BID Follow up Recommendations: Skilled Nursing facility SLP Visit Diagnosis: Dysphagia, unspecified (R13.10) Plan: Continue with current plan of care       Nalani Andreen I. Hardin Negus, [place], [place] Office number [phone] Pager [place] 12/12/2020, 11:54 AM

## 2020-12-12 NOTE — Progress Notes (Signed)
Occupational Therapy Treatment  On OT entry, pt reporting need for bedpan. Pt assisted on bedpan and continues to require Total A for LB ADLs bed level due to significant weakness, including toileting tasks. Pt overall Supervision for self feeding, cues for techniques to decrease spillage and Min A for doffing/donning new hospital gown in bed. As planned, provided pt with various HEPs including light resistance with yellow band for B UE, theraputty for coordination and squeeze ball for grip strength. Guided pt in scapular and shoulder AAROM strategies to trial, as well as frequency recommendations to slowly improve endurance.     12/12/20 1500  OT Visit Information  Last OT Received On 12/12/20  Assistance Needed +2  History of Present Illness 60 yo admitted 11/28 with respiratory distress and unresponsive at home with Covid 19. Pt with AKI and HD dependent. CRRT 12/16-12/26. Trach 12/23. encephalopathy since admission. PEG 12/30.PMHx: obesity  Precautions  Precautions Fall  Precaution Comments PEG, trach, sacral wounds  Pain Assessment  Pain Assessment Faces  Faces Pain Scale 2  Pain Location buttocks  Pain Descriptors / Indicators Grimacing  Pain Intervention(s) Monitored during session;Other (comment) (notified RN pt requesting prescription barrier cream)  Cognition  Arousal/Alertness Awake/alert  Behavior During Therapy WFL for tasks assessed/performed  Overall Cognitive Status Within Functional Limits for tasks assessed  Upper Extremity Assessment  Upper Extremity Assessment RUE deficits/detail;LUE deficits/detail  RUE Deficits / Details fair grasp, 3/5 wrist, elbow 3/5, able to demo gravity minimized shoulder flexion/abduction demo muscle activation. PROM WFL. reports ulnar aspect of hand/forearm numb  RUE Sensation decreased light touch  RUE Coordination decreased fine motor;decreased gross motor  LUE Deficits / Details fair grasp, 3/5 wrist, elbow 3/5, able to demo gravity  minimized shoulder flexion/abduction demo muscle activation. PROM WFL  LUE Coordination decreased fine motor;decreased gross motor  Lower Extremity Assessment  Lower Extremity Assessment Defer to PT evaluation  ADL  Overall ADL's  Needs assistance/impaired  Eating/Feeding Supervision/ safety;Bed level  Eating/Feeding Details (indicate cue type and reason) Supervision, pt able to reach, grasp juice cup and bring to mouth. Spillage noted and pt fatigues with self feeding meal  Toileting- Clothing Manipulation and Hygiene Total assistance;Bed level  Toileting - Clothing Manipulation Details (indicate cue type and reason) Total A for bed pan use and clean up  General ADL Comments Session focused on HEP provision and instruction. Continues to require extensive assist for LB ADLs bed level due to significant weakness  Bed Mobility  Overal bed mobility Needs Assistance  Bed Mobility Rolling  Rolling Min assist  General bed mobility comments Min A to roll to R side with assist for LE  Restrictions  Weight Bearing Restrictions No  Vision- Assessment  Vision Assessment? No apparent visual deficits  General Comments  General comments (skin integrity, edema, etc.) Provided HEP for UE strengthening with yellow theraband attached to B bed rails, theraputty (yellow) for fine motor coordination and strength as well as squeeze ball to improve grasp. Provided manual massage to R UE due to discomfort/tingling sensations with pt demo improved use of UE  OT - End of Session  Activity Tolerance Patient tolerated treatment well  Patient left in bed;with call bell/phone within reach  Nurse Communication Mobility status;Other (comment) (request for prescription barrier cream)  OT Assessment/Plan  OT Plan Discharge plan remains appropriate;Frequency remains appropriate  OT Visit Diagnosis Other abnormalities of gait and mobility (R26.89);Unsteadiness on feet (R26.81);Muscle weakness (generalized) (M62.81)  OT  Frequency (ACUTE ONLY) Min 2X/week  [facility]  bed;Wheelchair (measurements OT);Wheelchair cushion (measurements OT)  AM-PAC OT "6 Clicks" Daily Activity Outcome Measure (Version 2)  Help from another person eating meals? 3  Help from another person taking care of personal grooming? 2  Help from another person toileting, which includes using toliet, bedpan, or urinal? 1  Help from another person bathing (including washing, rinsing, drying)? 1  Help from another person to put on and taking off regular upper body clothing? 1  Help from another person to put on and taking off regular lower body clothing? 1  6 Click Score 9  OT Goal Progression  Progress towards OT goals Progressing toward goals  Acute Rehab OT Goals  Patient Stated Goal increase strength  OT Goal Formulation With patient/family  Time For Goal Achievement 12/20/20  Potential to Achieve Goals Good  ADL Goals  Pt Will Perform Grooming with supervision;bed level;sitting  Pt Will Perform Upper Body Bathing with min assist;bed level;sitting  Pt/caregiver will Perform Home Exercise Program Increased strength;Both right and left upper extremity;With minimal assist;With written HEP provided  Additional ADL Goal #1 Pt will tolerate 20 minutes in semi-standing position with tilt bed at ~55*  Additional ADL Goal #2 Pt will perform bed mobility with Mod A +2 in preapration for ADLs  OT Time Calculation  OT Start Time (ACUTE ONLY) 1419  OT Stop Time (ACUTE ONLY) 1516  OT Time Calculation (min) 57 min  OT General Charges  $OT Visit 1 Visit  OT Treatments  $Self Care/Home Management  23-37 mins  $Therapeutic Exercise 23-37 mins

## 2020-12-12 NOTE — Progress Notes (Addendum)
Physical Therapy Treatment Patient Details Name: Roran Wegner MRN: [id number] DOB: 10-26-1961 Today's Date: 12/12/2020    History of Present Illness 60 yo admitted 11/28 with respiratory distress and unresponsive at home with Covid 19. Pt with AKI and HD dependent. CRRT 12/16-12/26. Trach 12/23. encephalopathy since admission. PEG 12/30.PMHx: obesity    PT Comments    Pt pleasant and continues to demonstrate improvement daily. PT with improved ability to roll, transition to sitting, maintain sitting balance and increased tolerance for standing at 45 degrees today. Pt educated for all HEP and placed in chair position with trunk strapping (per pt request ) to sit up and eat for breakfast. Encouraged continued mobility throughout the day and request nursing to place into chair position for all meals and tilt.  HR 104-126 Pt on RA throughout with SpO2 >90%    Follow Up Recommendations  SNF;LTACH;Supervision/Assistance - 24 hour     Equipment Recommendations  Hospital bed;Wheelchair (measurements PT);Wheelchair cushion (measurements PT);Other (comment) (hoyer)    Recommendations for Other Services       Precautions / Restrictions Precautions Precautions: Fall Precaution Comments: PEG, trach, sacral wounds    Mobility  Bed Mobility Overal bed mobility: Needs Assistance Bed Mobility: Rolling;Sidelying to Sit;Sit to Sidelying Rolling: Min assist Sidelying to sit: Mod assist   Sit to supine: Mod assist   General bed mobility comments: pt able to roll bil without physical assist with increased time. Transition from side to sitting pt able to get legs off EOB and required assist to elevate trunk from surface. Return to bed pt required assist to lift legs to surface with ability to control trunk. Max assist to slide toward [facility]  Transfers                 General transfer comment: max assist with tilt bed to achieve 45 degrees for 8 min with cues to reengage quads in standing with  improved control and muscle contraction. Bil UE support on railings to maintain trunk position without trunk strap  Ambulation/Gait                 Stairs             Wheelchair Mobility    Modified Rankin (Stroke Patients Only)       Balance     Sitting balance-Leahy Scale: Fair Sitting balance - Comments: sitting EOB with and without bil UE support   Standing balance support: Bilateral upper extremity supported Standing balance-Leahy Scale: Zero Standing balance comment: strapping of kreg bed                            Cognition Arousal/Alertness: Awake/alert Behavior During Therapy: WFL for tasks assessed/performed Overall Cognitive Status: Within Functional Limits for tasks assessed                                        Exercises General Exercises - Upper Extremity Chair Push Up: AROM;Both;10 reps;Seated General Exercises - Lower Extremity Long Arc Quad: AROM;AAROM;Left;Right;Seated;15 reps (AAROM on LLE) Heel Slides: AROM;AAROM;Right;Left;Supine;15 reps (AAROm on left) Hip ABduction/ADduction: AROM;Supine;Both;15 reps Straight Leg Raises: AAROM;Both;Supine;5 reps Heel Raises: AROM;Both;15 reps;Seated Mini-Sqauts: AROM;Both;Standing;10 reps (10 at 35 degrees and 10 at 45 degrees)    General Comments        Pertinent Vitals/Pain Pain Assessment: No/denies pain    Home Living  Prior Function            PT Goals (current goals can now be found in the care plan section) Acute Rehab PT Goals Time For Goal Achievement: 12/26/20 Progress towards PT goals: Progressing toward goals    Frequency    Min 3X/week      PT Plan Current plan remains appropriate    Co-evaluation              AM-PAC PT "6 Clicks" Mobility   Outcome Measure  Help needed turning from your back to your side while in a flat bed without using bedrails?: A Little Help needed moving from lying on your  back to sitting on the side of a flat bed without using bedrails?: A Lot Help needed moving to and from a bed to a chair (including a wheelchair)?: Total Help needed standing up from a chair using your arms (e.g., wheelchair or bedside chair)?: Total Help needed to walk in hospital room?: Total Help needed climbing 3-5 steps with a railing? : Total 6 Click Score: 9    End of Session   Activity Tolerance: Patient tolerated treatment well Patient left: in bed;with call bell/phone within reach Nurse Communication: Mobility status;Need for lift equipment PT Visit Diagnosis: Other abnormalities of gait and mobility (R26.89);Difficulty in walking, not elsewhere classified (R26.2);Other symptoms and signs involving the nervous system (R29.898);Muscle weakness (generalized) (M62.81)     Time: 1478-2956 PT Time Calculation (min) (ACUTE ONLY): 55 min  Charges:  $Therapeutic Exercise: 23-37 mins $Therapeutic Activity: 23-37 mins                     Lennart Gladish P, PT Acute Rehabilitation Services Pager: [phone] Office: [place] 12/12/2020, 9:06 AM

## 2020-12-12 NOTE — Consult Note (Addendum)
[place] Nurse wound follow up Patient receiving care in [facility]W23 Wound type:sacral DTI with MASD Wound bed: clean and pink over the buttocks;  3 areas on the buttocks appears to be from friction and shear with no drainage. Much improvement noted today. Toes are black dry and stable.  Periwound:intactwhite from zinc based moisture barrier cream. Dressing procedure/placement/frequency: Continue zinc based moisture barrier cream (Desitin) to the buttocks; BID and PRN after each episode of incontinence. Remind patient to turn side to side to prevent worsening of wounds.  All wounds are stable. No need for WOC to continue to follow. Signing off at this time.  Monitor the wound area(s) for worsening of condition such as: Signs/symptoms of infection, increase in size, development of or worsening of odor, development of pain, or increased pain at the affected locations.   Notify the medical team if any of these develop.  Thank you for the consult. Please re-consult the [place] team if needed.  Cathlean Marseilles Tamala Julian, MSN, RN, [place], Lysle Pearl, [facility] Wound Treatment Associate Pager [phone]

## 2020-12-12 NOTE — Progress Notes (Signed)
Inpatient Rehabilitation Admissions Coordinator  Inpatient rehab consult received. Patient not yet at a level to pursue the intensity required of an inpt rehab admit. I will follow his progress. I have alerted Dr. Sloan Leiter, acute team and TOC.  Danne Baxter, RN, MSN Rehab Admissions Coordinator [phone] 12/12/2020 1:01 PM

## 2020-12-12 NOTE — Progress Notes (Signed)
Nutrition Follow-up  DOCUMENTATION CODES:   Non-severe (moderate) malnutrition in context of acute illness/injury  INTERVENTION:   Ensure Enlive po TID, each supplement provides 350 kcal and 20 grams of protein  Magic cup TID with meals, each supplement provides 290 kcal and 9 grams of protein  MVI with minerals daily  NUTRITION DIAGNOSIS:   Moderate Malnutrition related to acute illness (COVID-19) as evidenced by moderate fat depletion,moderate muscle depletion,mild fat depletion,mild muscle depletion.  ongoing  GOAL:   Provide needs based on ASPEN/SCCM guidelines  progressing  MONITOR:   Vent status,Labs,Weight trends,TF tolerance,Skin  REASON FOR ASSESSMENT:   Ventilator,Consult Enteral/tube feeding initiation and management  ASSESSMENT:   60 year old male with no significant medical history. He was found to be COVID-19 positive on 11/26. He presented to [facility] with profound hypoxemia on 11/28 and was emergently intubated in the ED; found to be in ARDS and transferred to [facility].  Pt's trach downsized to #6 cuffless on 1/14 and pt has been on room air since 1/17. Pt previously receiving TF via G-tube; however, diet advanced to dysphagia 2 with thin liquids today and TF was discontinued by MD.   UOP: 301ml x24 hours  Labs reviewed. Medications: colace, pepcid  Diet Order:   Diet Order            DIET DYS 2 Room service appropriate? Yes with Assist; Fluid consistency: Thin  Diet effective now                 EDUCATION NEEDS:   No education needs have been identified at this time  Skin:  Skin Assessment: Skin Integrity Issues: Skin Integrity Issues:: DTI,Unstageable,Other (Comment) DTI: sacrum, toe Unstageable: full thickness to R nose Other: MASD buttocks  Last BM:  1/14  Height:   Ht Readings from Last 1 Encounters:  11/11/20 6' (1.829 m)    Weight:   Wt Readings from Last 1 Encounters:  11/22/20 110.9 kg   BMI:  Body mass  index is 33.16 kg/m.  Estimated Nutritional Needs:   Kcal:  6834-1962  Protein:  160-180 gm  Fluid:  >/= 2.5 L    Larkin Ina, MS, RD, LDN RD pager number and weekend/on-call pager number located in [place].

## 2020-12-12 NOTE — Progress Notes (Signed)
PROGRESS NOTE    Jackson Figueroa  CBJ:[id number] DOB: July 27, 1961 DOA: 10/20/2020 PCP: Patient, No Pcp Per    Brief Narrative:  60 year old male with no significant past medical history was admitted to [facility] with on 11/28 with severe hypoxic respiratory failure, ARDS/Covid pneumonia, he tested positive on 11/26 PTA. Significant events, 11/29, intubated on arrival and transfer to [facility].  Prolonged respiratory failure. 12/23, eventual tracheostomy, gradually weaning and now almost on room air with trach. 12/30, PEG tube placed.  Tolerating tube feeding.  Also started eating dysphagia diet. 12/16 -16/07, course complicated by severe AKI due to ATN from septic shock and COVID, required CRRT and then intermittent hemodialysis.  Last hemodialysis on 12/28.  Hemodialysis catheter removed and now renal functions improved. 1/10, transferred to general medical bed. 1/ 14, tracheostomy downsized to #6 cuffless. 1/17, remains on room air since then. 1/20, patient able to eat dysphagia 2 diet.  Currently on room air.  Highly motivated for rehab.   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  Clinically improved.  Stable on trach collar and now on room air. Seen by speech, passed swallow evaluation.  Advanced to dysphagia 2 diet today. Patient is adequately able to eat, discontinue PEG tube feeding today. Continue  to work with PT OT. Patient currently has cuffless trach, on room air and normal speech with PMV and eating dysphagia diet. Patient is very appropriate candidate for acute inpatient rehab.  We will send referral.  Acute metabolic encephalopathy: Improved.  On low-dose Seroquel at night.  Tolerating well.  We will continue until rehab then will need gradual tapering off.  Acute renal failure with hypernatremia: Improved.  Normalized.   MDR stenotrophomonas pneumonia: Completed antibiotic therapy.  Critical illness neuropathy/severe deconditioning: Needs aggressive rehab. He has sinus tachycardia, TSH is normal.  Continues to have sinus tachycardia.  We will add low-dose metoprolol.  Echocardiogram with normal ejection fraction.  Pressure injuries of sacrum and toes, not present on admission: Local wound care.   DVT prophylaxis: heparin injection 5,000 Units Start: 11/21/20 2200   Code Status: Full code Family Communication: None, patient is communicating. Disposition Plan: Status is: Inpatient  Remains inpatient appropriate because:Unsafe d/c plan   Dispo: The patient is from: Home              Anticipated d/c is to: Skilled nursing facility versus acute inpatient rehab..              Anticipated d/c date is: When bed is available.              Patient currently is medically stable to d/c.  Medically stable to DC to next level of care.    Consultants:   PCCM  Nephrology  Palliative medicine  Procedures:   Tracheostomy 12/23  PEG tube placement 12/30  Intermittent hemodialysis and now off.  Last dialysis on 12/28.  Antimicrobials:   Completed all antibiotics.  Subjective: Patient seen and examined.  No overnight events.  Very happy to be able to eat without coughing or choking. He was very excited to be able to work with physical therapy and hold his head straight.  Objective: Vitals:   12/12/20 0433 12/12/20 0856 12/12/20 1027 12/12/20 1200  BP: 125/87      Pulse: (!) 105 (!) 110 (!) 126   Resp: 17   20  Temp: 97.9 F (36.6 C)     TempSrc: Oral     SpO2: 90%     Weight:      Height:        Intake/Output Summary (Last 24 hours) at 12/12/2020 1311 Last data filed at 12/12/2020 1009 Gross per 24 hour  Intake 10 ml  Output 3050 ml  Net -3040 ml   Filed Weights   11/19/20 0500 11/21/20 0447 11/22/20 0500  Weight: 108.3 kg 108.5 kg 110.9 kg    Examination:  Physical Exam Constitutional:      Appearance: Normal appearance.     Comments: Pretty debilitated gentleman.  On room air.  HENT:     Head: Normocephalic.     Comments: Trach collar, on room air. Cardiovascular:     Rate and Rhythm: Regular rhythm.  Pulmonary:     Effort: Pulmonary effort is normal.     Breath sounds: Normal breath sounds.  Abdominal:     Comments:  PEG tube site clean and dry.   Neurological:     Mental Status: He is alert and oriented to person, place, and time.     Comments: Generalized weakness.   He has weakness of all muscle groups.    Data Reviewed: I have personally reviewed following labs and imaging studies  CBC: Recent Labs  Lab 12/10/20 0335  WBC 8.2  NEUTROABS 5.7  HGB 10.0*  HCT 32.0*  MCV 104.2*  PLT 332   Basic Metabolic Panel: Recent Labs  Lab 12/07/20 0146 12/10/20 0335  NA 146* 135  K 4.5 4.5  CL 113* 101  CO2 23 23  GLUCOSE 91 107*  BUN 62* 49*  CREATININE 1.13 1.01  CALCIUM 9.0 8.8*  MG 2.1 1.8  PHOS 4.7* 4.6   GFR: Estimated Creatinine Clearance: 103.8 mL/min (by C-G formula based on SCr of 1.01 mg/dL). Liver Function Tests: No results for input(s): AST, ALT, ALKPHOS, BILITOT, PROT, ALBUMIN in the last 168 hours. No results for input(s): LIPASE, AMYLASE in the last 168 hours. No results for input(s): AMMONIA in the last 168 hours. Coagulation Profile: No results for input(s): INR, PROTIME in the last 168 hours. Cardiac Enzymes: No results for input(s): CKTOTAL, CKMB, CKMBINDEX, TROPONINI in the last  168 hours. BNP (last 3 results) No results for input(s): PROBNP in the last 8760 hours. HbA1C: No results for input(s): HGBA1C in the last 72 hours. CBG: Recent Labs  Lab 12/11/20 1250 12/11/20 1644 12/11/20 1959 12/12/20 0741 12/12/20 1130  GLUCAP 103* 118* 143* 94 93   Lipid Profile: No results for input(s): CHOL, HDL, LDLCALC, TRIG, CHOLHDL, LDLDIRECT in the last 72 hours. Thyroid Function Tests: Recent Labs    12/11/20 1508  TSH 2.027   Anemia Panel: No results for input(s): VITAMINB12, FOLATE, FERRITIN, TIBC, IRON, RETICCTPCT in the last 72 hours. Sepsis Labs: No results for input(s): PROCALCITON, LATICACIDVEN in the last 168 hours.  No results found for this or any previous visit (from the past 240 hour(s)).       Radiology Studies: No results found.  Scheduled Meds: . chlorhexidine gluconate (MEDLINE KIT)  15 mL Mouth Rinse BID  . Chlorhexidine Gluconate Cloth  6 each Topical Q0600  . docusate  50 mg Per Tube BID  . famotidine  20 mg Per Tube QHS  . free water  500 mL Per Tube Q4H  . heparin injection (subcutaneous)  5,000 Units Subcutaneous Q8H  . mouth rinse  15 mL Mouth Rinse 10 times per day  . metoprolol tartrate  12.5 mg Oral BID  . QUEtiapine  50 mg Per Tube QHS  . sodium bicarbonate  650 mg Per Tube BID  . sodium chloride flush  10-40 mL Intracatheter Q12H   Continuous Infusions: . sodium chloride 10 mL/hr at 12/05/20 0600     LOS: 53 days    Time spent: 25 minutes    [name] Merino, MD [facility] Pager [phone]

## 2020-12-13 LAB — GLUCOSE, CAPILLARY
Glucose-Capillary: 75 mg/dL (ref 70–99)
Glucose-Capillary: 79 mg/dL (ref 70–99)
Glucose-Capillary: 82 mg/dL (ref 70–99)
Glucose-Capillary: 102 mg/dL — ABNORMAL HIGH (ref 70–99)

## 2020-12-13 MED ORDER — SODIUM CHLORIDE 0.9 % IV BOLUS
250.0000 mL | Freq: Once | INTRAVENOUS | Status: AC
  Administered 2020-12-13: 250 mL via INTRAVENOUS

## 2020-12-13 NOTE — TOC Progression Note (Addendum)
Transition of Care [facility]) - Progression Note    Patient Details  Name: Jackson Figueroa MRN: [id number] Date of Birth: [birth year]  Transition of Care [facility]) CM/SW [place], RN Phone Number: 12/13/2020, 8:15 AM  Clinical Narrative:    Case management and MSW on Difficult to Place Team assumed care of the patient for transitions of care planning.  I spoke with Anderson Malta, Lyndon with [facility] will reassess patient's rehab needs today.  Patient's family would like patient placed close to [place] , [place] area  I will assess for possible bed availability in the area for possible SNF placement.  12/13/2020 9528-  CM faxed out clinicals in the hub to [facility] [place] [facility] in [place], [place], [place], [facility] in [place], [place], [place] [facility] in [place], [place], and [place] [facility] in [place].  CM and CSW will continue to follow for SNF placement  Expected Discharge Plan: [place] Barriers to Discharge: Continued Medical Work up  Expected Discharge Plan and Services Expected Discharge Plan: [place]   Discharge Planning Services: CM Consult   Living arrangements for the past 2 months: Single Family Home                                       Social Determinants of Health (SDOH) Interventions    Readmission Risk Interventions No flowsheet data found.

## 2020-12-13 NOTE — Progress Notes (Signed)
Physical Therapy Treatment Patient Details Name: Jackson Figueroa MRN: [id number] DOB: Oct 24, 1961 Today's Date: 12/13/2020    History of Present Illness 60 yo admitted 11/28 with respiratory distress and unresponsive at home with Covid 19. Pt with AKI and HD dependent. CRRT 12/16-12/26. Trach 12/23. encephalopathy since admission. PEG 12/30.PMHx: obesity    PT Comments    Pt playing country music during today's session with continued progression with all mobility and strengthening. Pt able to sit and initiate pre standing with anterior translation and sacral lift. Focused on pre standing and strengthening as well as side to and from sit today to progress function. Educated for continued HEP and activity throughout the day.  HR 125-135 today     Follow Up Recommendations  LTACH;Supervision/Assistance - 24 hour     Equipment Recommendations  Hospital bed;Wheelchair (measurements PT);Wheelchair cushion (measurements PT);Other (comment) (hoyer)    Recommendations for Other Services       Precautions / Restrictions Precautions Precautions: Fall Precaution Comments: PEG, trach, sacral wounds Restrictions Weight Bearing Restrictions: No    Mobility  Bed Mobility Overal bed mobility: Needs Assistance Bed Mobility: Rolling;Sidelying to Sit;Sit to Sidelying Rolling: Min assist Sidelying to sit: Mod assist   Sit to supine: Mod assist   General bed mobility comments: min assist to roll bil x 2 trials. Assist to lift trunk from surface from sidely to sit x 2 trials and sit to sidely assist to lift legs to surface x 2 trials  Transfers Overall transfer level: Needs assistance               General transfer comment: partial sit to stand x 10 trials with mod +2 assist with bil knees blocked and pad at sacrum. Pt initiating anterior translation and partial sacral lift unabl to fully clear but increasing lift and bil UE and LE engagement with progressive trials  Ambulation/Gait                  Stairs             Wheelchair Mobility    Modified Rankin (Stroke Patients Only)       Balance Overall balance assessment: Needs assistance   Sitting balance-Leahy Scale: Fair Sitting balance - Comments: sitting EOB with and without bil UE support                                    Cognition Arousal/Alertness: Awake/alert Behavior During Therapy: WFL for tasks assessed/performed Overall Cognitive Status: Within Functional Limits for tasks assessed                                        Exercises General Exercises - Lower Extremity Short Arc QuadSinclair Ship;Both;Seated;10 reps Heel Slides: AROM;AAROM;Right;Left;Supine;10 reps (aAROm on left) Hip ABduction/ADduction: AROM;Supine;Both;10 reps    General Comments        Pertinent Vitals/Pain Pain Score: 4  Pain Location: buttocks Pain Descriptors / Indicators: Grimacing;Aching;Guarding Pain Intervention(s): Limited activity within patient's tolerance;Monitored during session;Repositioned    Home Living                      Prior Function            PT Goals (current goals can now be found in the care plan section) Progress towards PT goals: Progressing toward goals  Frequency    Min 3X/week      PT Plan Current plan remains appropriate    Co-evaluation              AM-PAC PT "6 Clicks" Mobility   Outcome Measure  Help needed turning from your back to your side while in a flat bed without using bedrails?: A Little Help needed moving from lying on your back to sitting on the side of a flat bed without using bedrails?: A Lot Help needed moving to and from a bed to a chair (including a wheelchair)?: Total Help needed standing up from a chair using your arms (e.g., wheelchair or bedside chair)?: Total Help needed to walk in hospital room?: Total Help needed climbing 3-5 steps with a railing? : Total 6 Click Score: 9    End of Session    Activity Tolerance: Patient tolerated treatment well Patient left: in bed;with call bell/phone within reach Nurse Communication: Mobility status;Need for lift equipment PT Visit Diagnosis: Other abnormalities of gait and mobility (R26.89);Difficulty in walking, not elsewhere classified (R26.2);Other symptoms and signs involving the nervous system (R29.898);Muscle weakness (generalized) (M62.81)     Time: 6270-3500 PT Time Calculation (min) (ACUTE ONLY): 38 min  Charges:  $Therapeutic Exercise: 8-22 mins $Therapeutic Activity: 23-37 mins                     Carlotta Telfair P, PT Acute Rehabilitation Services Pager: [phone] Office: Alfred B Venida [place] 12/13/2020, 10:12 AM

## 2020-12-13 NOTE — Progress Notes (Signed)
[facility] PROGRESS NOTE  Jackson Figueroa WUJ:[id number] DOB: [birth year] DOA: 10/20/2020 PCP: Patient, No Pcp Per       Status: Remains inpatient appropriate because:Unsafe d/c plan and Inpatient level of care appropriate due to severity of illness   Dispo: The patient is from: Home              Anticipated d/c is to: SNF              Anticipated d/c date is: > 3 days              Patient currently is medically stable to d/c.  Barriers to discharge: Patient profoundly deconditioned after severe COVID-pneumonia illness that required mechanical ventilation, trach and PEG.  He is clinically improving and likely will have tracheostomy tube removed soon.  He is tolerating a regular diet but will definitely need aggressive physical rehabilitation before he can return home to independent living.   Code Status: Full Family Communication: Patient only DVT prophylaxis: SQ heparin Vaccination status: Has not been vaccinated against COVID.  But has been admitted for COVID-pneumonia 10/18/2020.  Will be eligible for COVID vaccination on 01/18/2021  Foley catheter: No-external urinary catheter  HPI: 60 year old male patient with no significant past medical history was admitted to [facility] on 11/28 with severe hypoxemic respiratory failure secondary to COVID-pneumonia with ARDS.  He initially tested positive on 11/26.  Subsequently he was intubated on 11/29 and transferred to [facility].  Was unable to be weaned from the ventilator and a tracheostomy was placed on 12/23.  Since that time he has been liberated from the ventilator and weaned to room air and tolerating PMV on trach.  Course also complicated by severe acute kidney injury secondary to ATN in context of septic shock and COVID physiology.  He did require CRRT and then intermittent hemodialysis with last dialysis treatment on 12/28.  Temporary dialysis catheter has been removed and renal function has stabilized.  He also  required a PEG tube and is now tolerating solid diet.  Transferred out to a regular medical bed on 1/10.   Subjective: Alert sitting up in bed listening to country music on the radio.  Nursing staff because had utilized bedpan and had overflow of urine onto bed.  Discussed discharge options and states he does have a girlfriend that lives with him but she is disabled and would not be able to assist much with physical care.  Objective: Vitals:   12/12/20 2305 12/13/20 0400  BP:    Pulse: 82 80  Resp: 20 16  Temp:    SpO2: 93% 93%    Intake/Output Summary (Last 24 hours) at 12/13/2020 0740 Last data filed at 12/12/2020 2130 Gross per 24 hour  Intake 110 ml  Output 3050 ml  Net -2940 ml   Filed Weights   11/19/20 0500 11/21/20 0447 11/22/20 0500  Weight: 108.3 kg 108.5 kg 110.9 kg    Exam:  Constitutional: NAD, calm, comfortable Respiratory: clear to auscultation bilaterally, no wheezing, no crackles. Normal respiratory effort. No accessory muscle use.  #6 cuffless trach-PMV in place-patient phonating easily Cardiovascular: Regular rate and rhythm, no murmurs / rubs / gallops. No extremity edema. 2+ pedal pulses.  Abdomen: no tenderness, no masses palpated.  Bowel sounds positive.  PEG tube site unremarkable. Musculoskeletal: no clubbing / cyanosis. No joint deformity upper and lower extremities. Good ROM,  Normal muscle tone.  Skin: no rashes, lesions, ulcers. No induration Neurologic: CN 2-12 grossly intact. Sensation  intact, Strength 3/5 x all 4 extremities.  Psychiatric: Normal judgment and insight. Alert and oriented x 3. Normal mood.    Assessment/Plan: Acute problems: Acute hypoxic and hypercapnic respiratory failure secondary to COVID-19 ARDS -Stable on room air, trach now capped, anticipate removal soon  Acute metabolic encephalopathy secondary to recent ICU stay and COVID -Mentation is currently appropriate -On low-dose nocturnal Seroquel which can likely be tapered  and discontinued soon  Profound physical deconditioning/persistent tachycardia -PT OT have evaluated patient and recommend SNF versus CIR -Patient would have limited resources at home in regards to assisting with physical needs after discharge -Currently deconditioned enough that even sitting up in the bed eating causes extreme fatigue -Continue low-dose beta-blocker-this admission unremarkable  Sacral deep tissue injury/upper medial thigh maceration/DTI toes Pressure Injury 10/29/20 Sacrum Mid Deep Tissue Pressure Injury - Purple or maroon localized area of discolored intact skin or blood-filled blister due to damage of underlying soft tissue from pressure and/or shear. purple (Active)  Date First Assessed/Time First Assessed: 10/29/20 1300   Location: Sacrum  Location Orientation: Mid  Staging: Deep Tissue Pressure Injury - Purple or maroon localized area of discolored intact skin or blood-filled blister due to damage of underlying ...    Assessments 10/29/2020  4:05 PM 12/12/2020 11:00 AM  Dressing Type -- Foam - Lift dressing to assess site every shift  Dressing -- Clean;Dry;Intact  Dressing Change Frequency -- Daily  State of Healing -- Early/partial granulation  Site / Wound Assessment -- Dry;Clean  Peri-wound Assessment -- Intact  Wound Length (cm) 2 cm --  Wound Width (cm) 0.5 cm --  Wound Depth (cm) 0 cm --  Wound Surface Area (cm^2) 1 cm^2 --  Wound Volume (cm^3) 0 cm^3 --  Margins -- Unattached edges (unapproximated)  Drainage Amount -- None  Drainage Description -- Serosanguineous  Treatment -- Other (Comment)     No Linked orders to display     Pressure Injury 11/06/20 Nose Right Unstageable - Full thickness tissue loss in which the base of the injury is covered by slough (yellow, tan, gray, green or brown) and/or eschar (tan, brown or black) in the wound bed. Red/black area above R nare (Active)  Date First Assessed/Time First Assessed: 11/06/20 1656   Location: Nose   Location Orientation: Right  Staging: Unstageable - Full thickness tissue loss in which the base of the injury is covered by slough (yellow, tan, gray, green or brown) and/or esc...    Assessments 11/06/2020  4:57 PM 12/05/2020  2:00 PM  Dressing Type None None  Site / Wound Assessment Clean;Dry;Black;Red Clean  Peri-wound Assessment Intact Intact  Wound Length (cm) 1 cm --  Wound Width (cm) 0.75 cm --  Wound Depth (cm) 0 cm --  Wound Surface Area (cm^2) 0.75 cm^2 --  Wound Volume (cm^3) 0 cm^3 --     No Linked orders to display     Wound / Incision (Open or Dehisced) 11/18/20 (MASD) Moisture Associated Skin Damage Buttocks Left (Active)  Date First Assessed/Time First Assessed: 11/18/20 0815   Wound Type: (MASD) Moisture Associated Skin Damage  Location: Buttocks  Location Orientation: Left  Present on Admission: No    Assessments 11/18/2020  8:15 AM 12/12/2020  9:30 PM  Dressing Type Moisture barrier Foam - Lift dressing to assess site every shift  Dressing Status None Clean;Dry;Intact  Site / Wound Assessment Red;Friable;Pink;Bleeding --  Wound Length (cm) 4 cm --  Wound Width (cm) 4 cm --  Wound Depth (cm) 0 cm --  Wound Volume (cm^3) 0 cm^3 --  Wound Surface Area (cm^2) 16 cm^2 --  Margins Unattached edges (unapproximated) --  Drainage Amount Scant --  Drainage Description Sanguineous --  Treatment Cleansed;Other (Comment) --     No Linked orders to display     Wound / Incision (Open or Dehisced) 11/18/20 Skin tear Vertebral column Upper skin tear (Active)  Date First Assessed/Time First Assessed: 11/18/20 2300   Wound Type: Skin tear  Location: Vertebral column  Location Orientation: Upper  Wound Description (Comments): skin tear  Present on Admission: (c) Yes    Assessments 11/18/2020 11:30 PM 12/05/2020  2:00 PM  Dressing Type Foam - Lift dressing to assess site every shift Foam - Lift dressing to assess site every shift  Dressing Changed New --  Dressing Status  Clean;Dry;Intact Clean;Dry;Intact  Dressing Change Frequency PRN PRN  Site / Wound Assessment Pale;Pink Pale;Pink  Peri-wound Assessment -- Intact  Wound Length (cm) 6 cm --  Wound Width (cm) 1 cm --  Wound Depth (cm) 0 cm --  Wound Volume (cm^3) 0 cm^3 --  Wound Surface Area (cm^2) 6 cm^2 --  Margins Unattached edges (unapproximated) Unattached edges (unapproximated)  Drainage Amount None None  Treatment -- Cleansed     No Linked orders to display     Pressure Injury 11/18/20 Toe (Comment  which one) Anterior;Right Deep Tissue Pressure Injury - Purple or maroon localized area of discolored intact skin or blood-filled blister due to damage of underlying soft tissue from pressure and/or shear. DTI left  (Active)  Date First Assessed/Time First Assessed: 11/18/20 2330   Location: (c) Toe (Comment  which one)  Location Orientation: Anterior;Right  Staging: Deep Tissue Pressure Injury - Purple or maroon localized area of discolored intact skin or blood-filled bli...    Assessments 11/18/2020 11:30 PM 12/05/2020  2:00 PM  Dressing Type None --  Dressing Change Frequency -- Daily  State of Healing Eschar Epithelialized  Site / Wound Assessment Clean;Dry Clean;Dry  Peri-wound Assessment -- Intact  Wound Length (cm) 4 cm --  Wound Width (cm) 3 cm --  Wound Depth (cm) 0 cm --  Wound Surface Area (cm^2) 12 cm^2 --  Wound Volume (cm^3) 0 cm^3 --  Margins -- Unattached edges (unapproximated)  Drainage Amount None None  Treatment -- Cleansed     No Linked orders to display     Wound / Incision (Open or Dehisced) 12/02/20 Skin tear Penis red, pink (Active)  Date First Assessed/Time First Assessed: 12/02/20 0800   Wound Type: Skin tear  Location: Penis  Wound Description (Comments): red, pink    Assessments 12/02/2020  8:00 AM 12/05/2020  2:00 PM  Dressing Type None None  Site / Wound Assessment Painful;Pink;Red Pale;Pink  Peri-wound Assessment Intact Intact  Drainage Amount None None   Treatment Cleansed Cleansed     No Linked orders to display      Other problems: Acute kidney injury secondary to sepsis physiology from COVID and ATN -Briefly required CRRT and intermittent hemodialysis -Dialysis with stable renal function  MDR stenotrophomonas pneumonia -Has completed all antibiotics but remains on contact isolation  Dysphagia -Resolved, tolerating regular diet   Data Reviewed: Basic Metabolic Panel: Recent Labs  Lab 12/07/20 0146 12/10/20 0335  NA 146* 135  K 4.5 4.5  CL 113* 101  CO2 23 23  GLUCOSE 91 107*  BUN 62* 49*  CREATININE 1.13 1.01  CALCIUM 9.0 8.8*  MG 2.1 1.8  PHOS 4.7* 4.6   Liver Function  Tests: No results for input(s): AST, ALT, ALKPHOS, BILITOT, PROT, ALBUMIN in the last 168 hours. No results for input(s): LIPASE, AMYLASE in the last 168 hours. No results for input(s): AMMONIA in the last 168 hours. CBC: Recent Labs  Lab 12/10/20 0335  WBC 8.2  NEUTROABS 5.7  HGB 10.0*  HCT 32.0*  MCV 104.2*  PLT 221   Cardiac Enzymes: No results for input(s): CKTOTAL, CKMB, CKMBINDEX, TROPONINI in the last 168 hours. BNP (last 3 results) Recent Labs    10/20/20 2043  BNP 47.0    ProBNP (last 3 results) No results for input(s): PROBNP in the last 8760 hours.  CBG: Recent Labs  Lab 12/12/20 0741 12/12/20 1130 12/12/20 1633 12/12/20 1941 12/12/20 2338  GLUCAP 94 93 87 98 89    No results found for this or any previous visit (from the past 240 hour(s)).   Studies: No results found.  Scheduled Meds:  chlorhexidine gluconate (MEDLINE KIT)  15 mL Mouth Rinse BID   Chlorhexidine Gluconate Cloth  6 each Topical Q0600   docusate  50 mg Per Tube BID   famotidine  20 mg Per Tube QHS   feeding supplement  237 mL Oral TID BM   free water  500 mL Per Tube Q4H   heparin injection (subcutaneous)  5,000 Units Subcutaneous Q8H   mouth rinse  15 mL Mouth Rinse BID   metoprolol tartrate  12.5 mg Oral BID    multivitamin with minerals  1 tablet Oral Daily   QUEtiapine  50 mg Per Tube QHS   sodium bicarbonate  650 mg Per Tube BID   sodium chloride flush  10-40 mL Intracatheter Q12H   Continuous Infusions:  sodium chloride 10 mL/hr at 12/05/20 0600    Active Problems:   Acute respiratory failure due to COVID-19 [facility])   Acute respiratory distress syndrome (ARDS) due to COVID-19 virus ([place])   Septic shock ([place])   AKI (acute kidney injury) ([place])   Protein calorie malnutrition ([place])   Acute metabolic encephalopathy   Ileus ([place])   Acute respiratory failure with hypoxemia ([place])   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  Last dialysis on 12/28.   Antibiotics: Anti-infectives (From admission, onward)   Start     Dose/Rate Route Frequency Ordered Stop   11/22/20 1200  cefTRIAXone (ROCEPHIN) 2 g in sodium chloride 0.9 % 100 mL IVPB        2 g 200 mL/hr over 30 Minutes Intravenous Every 24 hours 11/22/20 1110 11/28/20 1323   11/22/20 1000  minocycline (MINOCIN) capsule 100 mg        100 mg Per Tube Every 12 hours 11/22/20 0754 12/05/20 0951   11/21/20 1454  ceFAZolin (ANCEF) powder          As needed 11/21/20 1454 11/21/20 1454   11/21/20 1448  ceFAZolin (ANCEF) 2-4 GM/100ML-% IVPB       Note to Pharmacy: Arlean Hopping   : cabinet override      11/21/20 1448 11/22/20 0259   11/15/20 1130  minocycline (MINOCIN) capsule 100 mg        100 mg Per Tube Every 12 hours 11/15/20 1030 11/20/20 0751   11/14/20 1700  doxycycline (VIBRAMYCIN) 100 mg in sodium chloride 0.9 %  250 mL IVPB  Status:  Discontinued        100 mg 125 mL/hr over 120 Minutes Intravenous 2 times daily 11/14/20 1621 11/15/20 1029   11/13/20 0500  minocycline  (MINOCIN) capsule 100 mg  Status:  Discontinued        100 mg Per Tube 2 times daily 11/12/20 1511 11/14/20 1619   11/12/20 1600  minocycline (MINOCIN) capsule 200 mg        200 mg Per Tube  Once 11/12/20 1511 11/12/20 1604   11/03/20 1600  levofloxacin (LEVAQUIN) IVPB 750 mg        750 mg 100 mL/hr over 90 Minutes Intravenous Every 48 hours 11/02/20 1010 11/05/20 1752   11/02/20 1015  ceFEPIme (MAXIPIME) 2 g in sodium chloride 0.9 % 100 mL IVPB  Status:  Discontinued        2 g 200 mL/hr over 30 Minutes Intravenous Every 12 hours 11/02/20 0926 11/04/20 1321   11/02/20 1015  vancomycin (VANCOREADY) IVPB 2000 mg/400 mL        2,000 mg 200 mL/hr over 120 Minutes Intravenous  Once 11/02/20 0926 11/02/20 1414   11/02/20 1011  vancomycin variable dose per unstable renal function (pharmacist dosing)  Status:  Discontinued         Does not apply See admin instructions 11/02/20 1011 11/03/20 1055   10/29/20 1500  levofloxacin (LEVAQUIN) IVPB 750 mg  Status:  Discontinued        750 mg 100 mL/hr over 90 Minutes Intravenous Every 24 hours 10/29/20 1400 11/02/20 1010   10/27/20 2300  vancomycin (VANCOCIN) IVPB 1000 mg/200 mL premix  Status:  Discontinued        1,000 mg 200 mL/hr over 60 Minutes Intravenous Every 12 hours 10/27/20 1352 10/28/20 1442   10/27/20 1800  ceFEPIme (MAXIPIME) 2 g in sodium chloride 0.9 % 100 mL IVPB  Status:  Discontinued        2 g 200 mL/hr over 30 Minutes Intravenous Every 8 hours 10/27/20 1345 10/29/20 1400   10/27/20 1015  vancomycin (VANCOREADY) IVPB 2000 mg/400 mL        2,000 mg 200 mL/hr over 120 Minutes Intravenous STAT 10/27/20 0925 10/27/20 1247   10/27/20 1000  ceFEPIme (MAXIPIME) 2 g in sodium chloride 0.9 % 100 mL IVPB        2 g 200 mL/hr over 30 Minutes Intravenous STAT 10/27/20 0925 10/27/20 1043   10/21/20 1000  remdesivir 100 mg in sodium chloride 0.9 % 100 mL IVPB       "Followed by" Linked Group Details   100 mg 200 mL/hr over 30 Minutes  Intravenous Daily 10/20/20 2224 10/24/20 1101   10/20/20 2315  vancomycin (VANCOCIN) IVPB 1000 mg/200 mL premix        1,000 mg 200 mL/hr over 60 Minutes Intravenous  Once 10/20/20 2309 10/21/20 0223   10/20/20 2315  piperacillin-tazobactam (ZOSYN) IVPB 3.375 g        3.375 g 100 mL/hr over 30 Minutes Intravenous  Once 10/20/20 2309 10/21/20 0021   10/20/20 2300  remdesivir 100 mg in sodium chloride 0.9 % 100 mL IVPB       "Followed by" Linked Group Details   100 mg 200 mL/hr over 30 Minutes Intravenous Every 30 min 10/20/20 2224 10/21/20 0124   10/20/20 2045  cefTRIAXone (ROCEPHIN) 2 g in sodium chloride 0.9 % 100 mL IVPB  Status:  Discontinued        2 g 200 mL/hr  over 30 Minutes Intravenous Every 24 hours 10/20/20 2043 10/21/20 1028   10/20/20 2045  azithromycin (ZITHROMAX) 500 mg in sodium chloride 0.9 % 250 mL IVPB  Status:  Discontinued        500 mg 250 mL/hr over 60 Minutes Intravenous Every 24 hours 10/20/20 2043 10/21/20 1028        Time spent: 35 minutes    Erin Hearing ANP  [facility] 7 am - 330 pm/M-F for direct patient care and secure chat Please refer to Amion for contact info 54  days

## 2020-12-14 LAB — GLUCOSE, CAPILLARY
Glucose-Capillary: 88 mg/dL (ref 70–99)
Glucose-Capillary: 128 mg/dL — ABNORMAL HIGH (ref 70–99)
Glucose-Capillary: 156 mg/dL — ABNORMAL HIGH (ref 70–99)
Glucose-Capillary: 91 mg/dL (ref 70–99)

## 2020-12-14 MED ORDER — ACETAMINOPHEN 325 MG PO TABS
650.0000 mg | ORAL_TABLET | Freq: Four times a day (QID) | ORAL | Status: DC | PRN
  Administered 2020-12-15 – 2020-12-18 (×4): 650 mg via ORAL
  Filled 2020-12-14 (×4): qty 2

## 2020-12-14 MED ORDER — QUETIAPINE FUMARATE 50 MG PO TABS
50.0000 mg | ORAL_TABLET | Freq: Every day | ORAL | Status: DC
  Administered 2020-12-14: 50 mg via ORAL
  Filled 2020-12-14: qty 1

## 2020-12-14 MED ORDER — DOCUSATE SODIUM 50 MG PO CAPS
50.0000 mg | ORAL_CAPSULE | Freq: Two times a day (BID) | ORAL | Status: DC
  Administered 2020-12-15 – 2020-12-18 (×8): 50 mg via ORAL
  Filled 2020-12-14 (×11): qty 1

## 2020-12-14 MED ORDER — FAMOTIDINE 20 MG PO TABS
20.0000 mg | ORAL_TABLET | Freq: Two times a day (BID) | ORAL | Status: DC
  Administered 2020-12-14 – 2020-12-18 (×8): 20 mg via ORAL
  Filled 2020-12-14 (×9): qty 1

## 2020-12-14 NOTE — Progress Notes (Signed)
PROGRESS NOTE    Jackson Figueroa  VPX:[id number] DOB: 08/28/1961 DOA: 10/20/2020 PCP: Patient, No Pcp Per    Brief Narrative:  60 year old male with no significant past medical history was admitted to [facility] with on 11/28 with severe hypoxic respiratory failure, ARDS/Covid pneumonia, he tested positive on 11/26 PTA. Significant events, 11/29, intubated on arrival and transfer to [facility].  Prolonged respiratory failure. 12/23, eventual tracheostomy, gradually weaning and now almost on room air with trach. 12/30, PEG tube placed.  Tolerating tube feeding.  Also started eating dysphagia diet. 12/16 -46/27, course complicated by severe AKI due to ATN from septic shock and COVID, required CRRT and then intermittent hemodialysis.  Last hemodialysis on 12/28.  Hemodialysis catheter removed and now renal functions improved. 1/10, transferred to general medical bed. 1/ 14, tracheostomy downsized to #6 cuffless. 1/17, remains on room air since then. 1/20, patient able to eat dysphagia 2 diet.  Currently on room air.  Highly motivated for rehab.   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  Clinically improved.  Stable on trach collar and now on room air. Seen by speech, passed swallow evaluation.  Patient consuming 100% of the meal by mouth, discontinue PEG tube feeding. Flush every day. Continue to work with  PT OT. Patient currently has cuffless trach, on room air and normal speech with PMV and eating dysphagia diet. Patient is very appropriate candidate for acute inpatient rehab.   If remains in the hospital, will ask PCCM to decannulate next week.  Acute metabolic encephalopathy: Improved.  On low-dose Seroquel at night.  Tolerating well.  We will continue until rehab then will need gradual tapering off.  Acute renal failure with hypernatremia: Improved.  Normalized.   MDR stenotrophomonas pneumonia: Completed antibiotic therapy.  Critical illness neuropathy/severe deconditioning: Needs aggressive rehab. He has sinus tachycardia, TSH is normal.  Continues to have sinus tachycardia. On metoprolol. Echocardiogram with normal ejection fraction.  Pressure injuries of sacrum and toes, not present on admission: Local wound care.   DVT prophylaxis: heparin injection 5,000 Units Start: 11/21/20 2200   Code Status: Full code Family Communication: None, patient is communicating with his girlfriend. Disposition Plan: Status is: Inpatient  Remains inpatient appropriate because:Unsafe d/c plan   Dispo: The patient is from: Home              Anticipated d/c is to: Skilled nursing facility versus acute inpatient rehab..              Anticipated d/c date is: When bed is available.              Patient currently is medically stable to d/c.  Medically stable to DC to next level of care.    Consultants:   PCCM  Nephrology  Palliative medicine  Procedures:   Tracheostomy 12/23  PEG tube placement 12/30  Intermittent hemodialysis and now off.  Last dialysis on 12/28.  Antimicrobials:  Completed all antibiotics.   Subjective: Patient seen and examined. No overnight events.  Objective: Vitals:   12/14/20 0459 12/14/20 0808 12/14/20 0840 12/14/20 1125  BP: 112/83  111/87   Pulse: (!) 104  (!) 113   Resp: $Remo'20 20 20 'EMenn$ (!) 23  Temp: 98 F (36.7 C)  98.7 F (37.1 C)   TempSrc: Oral   Oral   SpO2: 95% 96% 100% 98%  Weight:      Height:        Intake/Output Summary (Last 24 hours) at 12/14/2020 1306 Last data filed at 12/14/2020 0622 Gross per 24 hour  Intake 360 ml  Output 1050 ml  Net -690 ml   Filed Weights   11/19/20 0500 11/21/20 0447 11/22/20 0500  Weight: 108.3 kg 108.5 kg 110.9 kg    Examination:  Physical Exam Constitutional:      Appearance: Normal appearance.     Comments: Pretty debilitated gentleman.  On room air.  HENT:     Head: Normocephalic.     Comments: Trach collar, on room air. Cardiovascular:     Rate and Rhythm: Regular rhythm.  Pulmonary:     Effort: Pulmonary effort is normal.     Breath sounds: Normal breath sounds.  Abdominal:     Comments:  PEG tube site clean and dry.   Neurological:     Mental Status: He is alert and oriented to person, place, and time.     Comments: Generalized weakness.   He has weakness of all muscle groups.    Data Reviewed: I have personally reviewed following labs and imaging studies  CBC: Recent Labs  Lab 12/10/20 0335  WBC 8.2  NEUTROABS 5.7  HGB 10.0*  HCT 32.0*  MCV 104.2*  PLT 161   Basic Metabolic Panel: Recent Labs  Lab 12/10/20 0335  NA 135  K 4.5  CL 101  CO2 23  GLUCOSE 107*  BUN 49*  CREATININE 1.01  CALCIUM 8.8*  MG 1.8  PHOS 4.6   GFR: Estimated Creatinine Clearance: 103.8 mL/min (by C-G formula based on SCr of 1.01 mg/dL). Liver Function Tests: No results for input(s): AST, ALT, ALKPHOS, BILITOT, PROT, ALBUMIN in the last 168 hours. No results for input(s): LIPASE, AMYLASE in the last 168 hours. No results for input(s): AMMONIA in the last 168 hours. Coagulation Profile: No results for input(s): INR, PROTIME in the last 168 hours. Cardiac Enzymes: No results for input(s): CKTOTAL, CKMB, CKMBINDEX, TROPONINI in the last 168 hours. BNP (last 3 results) No results for input(s): PROBNP in the last 8760 hours. HbA1C: No results for input(s): HGBA1C in the  last 72 hours. CBG: Recent Labs  Lab 12/13/20 1229 12/13/20 1725 12/13/20 2006 12/14/20 0837 12/14/20 1203  GLUCAP 79 82 102* 88 128*   Lipid Profile: No results for input(s): CHOL, HDL, LDLCALC, TRIG, CHOLHDL, LDLDIRECT in the last 72 hours. Thyroid Function Tests: Recent Labs    12/11/20 1508  TSH 2.027   Anemia Panel: No results for input(s): VITAMINB12, FOLATE, FERRITIN, TIBC, IRON, RETICCTPCT in the last 72 hours. Sepsis Labs: No results for input(s): PROCALCITON, LATICACIDVEN in the last 168 hours.  No results found for this or any previous visit (from the past 240 hour(s)).       Radiology Studies: No results found.      Scheduled Meds: . chlorhexidine gluconate (MEDLINE KIT)  15 mL Mouth Rinse BID  . Chlorhexidine Gluconate Cloth  6 each Topical Q0600  . docusate  50 mg Per  Tube BID  . famotidine  20 mg Per Tube QHS  . feeding supplement  237 mL Oral TID BM  . free water  500 mL Per Tube Q4H  . heparin injection (subcutaneous)  5,000 Units Subcutaneous Q8H  . mouth rinse  15 mL Mouth Rinse BID  . metoprolol tartrate  12.5 mg Oral BID  . multivitamin with minerals  1 tablet Oral Daily  . QUEtiapine  50 mg Per Tube QHS  . sodium bicarbonate  650 mg Per Tube BID  . sodium chloride flush  10-40 mL Intracatheter Q12H   Continuous Infusions: . sodium chloride 10 mL/hr at 12/05/20 0600     LOS: 55 days    Time spent: 25 minutes    [name] Merino, MD [facility] Pager [phone]

## 2020-12-15 LAB — GLUCOSE, CAPILLARY
Glucose-Capillary: 115 mg/dL — ABNORMAL HIGH (ref 70–99)
Glucose-Capillary: 133 mg/dL — ABNORMAL HIGH (ref 70–99)
Glucose-Capillary: 135 mg/dL — ABNORMAL HIGH (ref 70–99)
Glucose-Capillary: 87 mg/dL (ref 70–99)
Glucose-Capillary: 98 mg/dL (ref 70–99)

## 2020-12-15 MED ORDER — QUETIAPINE FUMARATE 25 MG PO TABS
25.0000 mg | ORAL_TABLET | Freq: Every day | ORAL | Status: DC
Start: 2020-12-15 — End: 2020-12-18
  Administered 2020-12-15 – 2020-12-17 (×3): 25 mg via ORAL
  Filled 2020-12-15 (×3): qty 1

## 2020-12-15 NOTE — Plan of Care (Signed)
  Problem: Nutritional: Goal: Ability to make appropriate dietary choices will improve Outcome: Progressing   Problem: Activity: Goal: Ability to tolerate increased activity will improve Outcome: Progressing   Problem: Respiratory: Goal: Patent airway maintenance will improve Outcome: Progressing   Problem: Role Relationship: Goal: Ability to communicate will improve Outcome: Progressing

## 2020-12-15 NOTE — Progress Notes (Signed)
Patient seen and examined.  No overnight events.  He is on room air.  Denies any complaints other than being weak.  Eating regular diet.  PEG tube is capped.  Looking forward for rehab.  Plan: Continue oral care.  Waiting for SNF availability.  Good candidate for CIR. Will talk to [facility] tomorrow whether he is ready to decannulate his tracheostomy.  Total time spent: 15 minutes  Physical Exam Constitutional:      Appearance: Normal appearance.  HENT:     Head: Atraumatic.  Cardiovascular:     Rate and Rhythm: Normal rate and regular rhythm.  Pulmonary:     Breath sounds: Normal breath sounds.  Neurological:     Mental Status: He is alert.     Comments: Frail and debilitated.  Generalized weakness all over muscle groups.

## 2020-12-15 NOTE — Plan of Care (Signed)
  Problem: Education: Goal: Knowledge of General Education information will improve Description: Including pain rating scale, medication(s)/side effects and non-pharmacologic comfort measures Outcome: Progressing   Problem: Health Behavior/Discharge Planning: Goal: Ability to manage health-related needs will improve Outcome: Progressing   Problem: Clinical Measurements: Goal: Ability to maintain clinical measurements within normal limits will improve Outcome: Progressing Goal: Will remain free from infection Outcome: Progressing Goal: Diagnostic test results will improve Outcome: Progressing Goal: Respiratory complications will improve Outcome: Progressing Goal: Cardiovascular complication will be avoided Outcome: Progressing   Problem: Activity: Goal: Risk for activity intolerance will decrease Outcome: Progressing   Problem: Nutrition: Goal: Adequate nutrition will be maintained Outcome: Progressing   Problem: Coping: Goal: Level of anxiety will decrease Outcome: Progressing   Problem: Elimination: Goal: Will not experience complications related to bowel motility Outcome: Progressing Goal: Will not experience complications related to urinary retention Outcome: Progressing   Problem: Pain Managment: Goal: General experience of comfort will improve Outcome: Progressing   Problem: Safety: Goal: Ability to remain free from injury will improve Outcome: Progressing   Problem: Skin Integrity: Goal: Risk for impaired skin integrity will decrease Outcome: Progressing   Problem: Education: Goal: Knowledge of risk factors and measures for prevention of condition will improve Outcome: Progressing   Problem: Coping: Goal: Psychosocial and spiritual needs will be supported Outcome: Progressing   Problem: Respiratory: Goal: Will maintain a patent airway Outcome: Progressing Goal: Complications related to the disease process, condition or treatment will be avoided or  minimized Outcome: Progressing   Problem: Education: Goal: Knowledge of risk factors and measures for prevention of condition will improve Outcome: Progressing   Problem: Coping: Goal: Psychosocial and spiritual needs will be supported Outcome: Progressing   Problem: Respiratory: Goal: Will maintain a patent airway Outcome: Progressing Goal: Complications related to the disease process, condition or treatment will be avoided or minimized Outcome: Progressing   Problem: Education: Goal: Knowledge of disease and its progression will improve Outcome: Progressing   Problem: Health Behavior/Discharge Planning: Goal: Ability to manage health-related needs will improve Outcome: Progressing   Problem: Clinical Measurements: Goal: Complications related to the disease process or treatment will be avoided or minimized Outcome: Progressing Goal: Dialysis access will remain free of complications Outcome: Progressing   Problem: Activity: Goal: Activity intolerance will improve Outcome: Progressing   Problem: Fluid Volume: Goal: Fluid volume balance will be maintained or improved Outcome: Progressing   Problem: Nutritional: Goal: Ability to make appropriate dietary choices will improve Outcome: Progressing   Problem: Respiratory: Goal: Respiratory symptoms related to disease process will be avoided Outcome: Progressing   Problem: Self-Concept: Goal: Body image disturbance will be avoided or minimized Outcome: Progressing   Problem: Urinary Elimination: Goal: Progression of disease will be identified and treated Outcome: Progressing   Problem: Education: Goal: Knowledge about tracheostomy care/management will improve Outcome: Progressing   Problem: Activity: Goal: Ability to tolerate increased activity will improve Outcome: Progressing   Problem: Health Behavior/Discharge Planning: Goal: Ability to manage tracheostomy will improve Outcome: Progressing   Problem:  Respiratory: Goal: Patent airway maintenance will improve Outcome: Progressing   Problem: Role Relationship: Goal: Ability to communicate will improve Outcome: Progressing

## 2020-12-16 DIAGNOSIS — J9600 Acute respiratory failure, unspecified whether with hypoxia or hypercapnia: Secondary | ICD-10-CM | POA: Diagnosis not present

## 2020-12-16 DIAGNOSIS — R5381 Other malaise: Secondary | ICD-10-CM

## 2020-12-16 DIAGNOSIS — Z430 Encounter for attention to tracheostomy: Secondary | ICD-10-CM

## 2020-12-16 DIAGNOSIS — U071 COVID-19: Secondary | ICD-10-CM | POA: Diagnosis not present

## 2020-12-16 LAB — GLUCOSE, CAPILLARY
Glucose-Capillary: 76 mg/dL (ref 70–99)
Glucose-Capillary: 87 mg/dL (ref 70–99)
Glucose-Capillary: 98 mg/dL (ref 70–99)
Glucose-Capillary: 141 mg/dL — ABNORMAL HIGH (ref 70–99)
Glucose-Capillary: 76 mg/dL (ref 70–99)
Glucose-Capillary: 100 mg/dL — ABNORMAL HIGH (ref 70–99)

## 2020-12-16 NOTE — Progress Notes (Signed)
Occupational Therapy Treatment Patient Details Name: Jackson Figueroa MRN: [id number] DOB: 12-17-1960 Today's Date: 12/16/2020    History of present illness 60 yo admitted 11/28 with respiratory distress and unresponsive at home with Covid 19. Pt with AKI and HD dependent. CRRT 12/16-12/26. Trach 12/23. encephalopathy since admission. PEG 12/30.PMHx: obesity   OT comments  Pt progressing well towards OT goals, continued improving strength in B UE noted. Pt able to hold B shoulders in flexion at 90* for a few seconds and assist with slow decent back at pt's side. Coordinated with nursing staff to assist with transition to sizewise bed with pt reporting fatigue after prolonged sitting in chair. With trial of various positioning, pt overall Mod A x 2-3 for sit to stand in [place] and transfer back to bed. Pt able to initiate lifting bottom from chair but requires physical assistance at posterior region to achieve full stance. Reinforced UE HEP, as well as simple activities that can improve fine motor coordination with pt able to demo holding pen, opening containers, etc. Provided built up grips to trial on utensils during meals to assist with efficiency of task. With noted progression and pt's motivation to return to PLOF, recommend CIR consult. Pt would benefit from consistent, intensive therapies. Plan to progress sitting/standing tolerance during ADLs in future sessions.    Follow Up Recommendations  CIR;Supervision/Assistance - 24 hour    Equipment Recommendations  Hospital bed;Wheelchair (measurements OT);Wheelchair cushion (measurements OT)    Recommendations for Other Services      Precautions / Restrictions Precautions Precautions: Fall Precaution Comments: PEG capped, trach, sacral wounds Restrictions Weight Bearing Restrictions: No       Mobility Bed Mobility Overal bed mobility: Needs Assistance Bed Mobility: Sit to Sidelying Rolling: Min guard       Sit to sidelying: Min  assist General bed mobility comments: Min A for negotiation of B LE back into bed due to weakness. Able to roll at min guard for repositioning of pads  Transfers Overall transfer level: Needs assistance Equipment used: Ambulation equipment used Transfers: Sit to/from Stand Sit to Stand: Mod assist;+2 safety/equipment;+2 physical assistance         General transfer comment: Pt fatigued from sitting in recliner. With various respositioning attempts, pt able to stand in [place] with Mod A x 2-3 (RN and NT assisting as well). Requires increased assist to rise buttocks to full stance due to weakness. Transferred back to bed via St[place]    Balance Overall balance assessment: Needs assistance Sitting-balance support: Bilateral upper extremity supported;Feet supported Sitting balance-Leahy Scale: Fair Sitting balance - Comments: able to sit EOB without physical assist, benefits from UE support EOB   Standing balance support: Bilateral upper extremity supported Standing balance-Leahy Scale: Poor Standing balance comment: standing with stedy and bil UE                           ADL either performed or assessed with clinical judgement   ADL Overall ADL's : Needs assistance/impaired Eating/Feeding: Set up;Bed level Eating/Feeding Details (indicate cue type and reason): pt reports difficulty holding to utensils, using L rather than R UE (R handed) and attempting to coordinate R UE use into tasks. Provided red foam grips to trial for improved efficiency of self feeding. NT aware               Upper Body Dressing Details (indicate cue type and reason): simulated with pt reaching to remove electrodes from torso, improving  use of B UE                   General ADL Comments: Pt continues to be limited by decreased strength, coordination, endurance and balance though improving. Session focused on transfer to new sizewise bed. Pt fatigued after sitting up in chair     Vision    Vision Assessment?: No apparent visual deficits   Perception     Praxis      Cognition Arousal/Alertness: Awake/alert Behavior During Therapy: WFL for tasks assessed/performed Overall Cognitive Status: Within Functional Limits for tasks assessed                                 General Comments: A&Ox4, very pleasant and motivated to improve strength.        Exercises     Shoulder Instructions       General Comments Reapplied yellow theraband to new bed, encouraged continued use of HEPs for strength as well as other tasks that assist with improving coordination (writing, opening containers, etc).    Pertinent Vitals/ Pain       Pain Assessment: Faces Faces Pain Scale: Hurts a little bit Pain Location: buttocks Pain Descriptors / Indicators: Sore Pain Intervention(s): Monitored during session;Repositioned  Home Living                                          Prior Functioning/Environment              Frequency  Min 2X/week        Progress Toward Goals  OT Goals(current goals can now be found in the care plan section)  Progress towards OT goals: Progressing toward goals  Acute Rehab OT Goals Patient Stated Goal: increase strength OT Goal Formulation: With patient/family Time For Goal Achievement: 12/20/20 Potential to Achieve Goals: Good ADL Goals Pt Will Perform Grooming: with supervision;bed level;sitting Pt Will Perform Upper Body Bathing: with min assist;bed level;sitting Pt/caregiver will Perform Home Exercise Program: Increased strength;Both right and left upper extremity;With minimal assist;With written HEP provided Additional ADL Goal #2: Pt will perform bed mobility with Mod A +2 in preapration for ADLs  Plan Discharge plan needs to be updated    Co-evaluation                 AM-PAC OT "6 Clicks" Daily Activity     Outcome Measure   Help from another person eating meals?: A Little Help from another  person taking care of personal grooming?: A Lot Help from another person toileting, which includes using toliet, bedpan, or urinal?: Total Help from another person bathing (including washing, rinsing, drying)?: A Lot Help from another person to put on and taking off regular upper body clothing?: A Lot Help from another person to put on and taking off regular lower body clothing?: Total 6 Click Score: 11    End of Session Equipment Utilized During Treatment: Gait belt;Other (comment) Charlaine Dalton)  OT Visit Diagnosis: Other abnormalities of gait and mobility (R26.89);Unsteadiness on feet (R26.81);Muscle weakness (generalized) (M62.81)   Activity Tolerance Patient tolerated treatment well;Patient limited by fatigue   Patient Left in bed;with call bell/phone within reach   Nurse Communication Mobility status;Other (comment) (sizewise bed footboard extension if possible)        Time: 1610-9604 OT Time Calculation (min): 32 min  Charges: OT General Charges $OT Visit: 1 Visit OT Treatments $Therapeutic Activity: 8-22 mins $Therapeutic Exercise: 8-22 mins  Layla Maw, OTR/L   Layla Maw 12/16/2020, 3:14 PM

## 2020-12-16 NOTE — Progress Notes (Addendum)
Physical Therapy Treatment Patient Details Name: Jackson Figueroa MRN: [id number] DOB: 07/28/1961 Today's Date: 12/16/2020    History of Present Illness 60 yo admitted 11/28 with respiratory distress and unresponsive at home with Covid 19. Pt with AKI and HD dependent. CRRT 12/16-12/26. Trach 12/23. encephalopathy since admission. PEG 12/30.PMHx: obesity    PT Comments    Pt very pleasant and motivated to move. Continued progression with all mobility this session with significant progression of transition to sitting and standing today. Pt with repeated standing and able to maintain up to 8 sec in standing. Pt educated for all HEP and progressive mobility with pt able to achieve lateral scoot EOB with min assist today! Pt would highly benefit from CIR stay to be able to perform transfers at a level safe for return home.   HR 104-124 RA throughout with SpO2 >95%    Follow Up Recommendations  CIR;Supervision/Assistance - 24 hour     Equipment Recommendations  3in1 (PT);Wheelchair (measurements PT);Wheelchair cushion (measurements PT)    Recommendations for Other Services       Precautions / Restrictions Precautions Precautions: Fall Precaution Comments: PEG capped, trach, sacral wounds    Mobility  Bed Mobility Overal bed mobility: Needs Assistance Bed Mobility: Rolling;Sidelying to Sit Rolling: Min guard Sidelying to sit: Mod assist       General bed mobility comments: pt able to roll bil with use of rail, increased time and momentum. From side to sit pt able to slide legs off EOB and get onto elbow with mod assist to fully lift trunk to sitting. Pt with fair sitting balance EOB but unable to reach outside BOS. Scoot at EOB with min assist for sacral shift  Transfers Overall transfer level: Needs assistance   Transfers: Sit to/from Stand;Stand Pivot Transfers Sit to Stand: Min assist;Mod assist;+2 physical assistance         General transfer comment: from bed elevated  grossly 3 inches pt able to stand with min +2 assist with bil Knees blocked x 2 and come to full standing with bil UE hooked on therapist. Pt then stood x 2 with stedy with mod +2 assist and performed better with UE hooking than use of Stedy bar to rise. Pivot from bed to chair with stedy. Final stand in stedy 8 sec with tactile cues for upright  Ambulation/Gait             General Gait Details: not yet ready   Stairs             Wheelchair Mobility    Modified Rankin (Stroke Patients Only)       Balance Overall balance assessment: Needs assistance   Sitting balance-Leahy Scale: Fair Sitting balance - Comments: sitting EOB with and without bil UE support   Standing balance support: Bilateral upper extremity supported Standing balance-Leahy Scale: Poor Standing balance comment: standing with stedy and bil UE support at EOB                            Cognition Arousal/Alertness: Awake/alert Behavior During Therapy: WFL for tasks assessed/performed Overall Cognitive Status: Within Functional Limits for tasks assessed                                        Exercises General Exercises - Lower Extremity Long Arc QuadBarbaraann Boys;Both;20 reps;Seated (pt able to achieve SAQ  AROM and AAROm to complete full ROM) Heel Slides: AROM;Both;Supine;20 reps Hip ABduction/ADduction: AAROM;Both;20 reps;Seated Hip Flexion/Marching: AAROM;Both;Seated;20 reps Toe Raises: AROM;Both;Seated;20 reps Heel Raises: Both;20 reps;Seated;AROM    General Comments        Pertinent Vitals/Pain Pain Score: 5  Pain Location: buttocks Pain Descriptors / Indicators: Grimacing;Aching;Guarding Pain Intervention(s): Limited activity within patient's tolerance;Monitored during session;Repositioned    Home Living                      Prior Function            PT Goals (current goals can now be found in the care plan section) Acute Rehab PT Goals Time For Goal  Achievement: 12/30/20 Potential to Achieve Goals: Good Additional Goals Additional Goal #1:  (met) Progress towards PT goals: Goals met and updated - see care plan    Frequency    Min 3X/week      PT Plan Discharge plan needs to be updated    Co-evaluation              AM-PAC PT "6 Clicks" Mobility   Outcome Measure  Help needed turning from your back to your side while in a flat bed without using bedrails?: A Little Help needed moving from lying on your back to sitting on the side of a flat bed without using bedrails?: A Lot Help needed moving to and from a bed to a chair (including a wheelchair)?: A Lot Help needed standing up from a chair using your arms (e.g., wheelchair or bedside chair)?: A Lot Help needed to walk in hospital room?: Total Help needed climbing 3-5 steps with a railing? : Total 6 Click Score: 11    End of Session   Activity Tolerance: Patient tolerated treatment well Patient left: in chair;with call bell/phone within reach;with chair alarm set Nurse Communication: Mobility status;Need for lift equipment PT Visit Diagnosis: Other abnormalities of gait and mobility (R26.89);Difficulty in walking, not elsewhere classified (R26.2);Other symptoms and signs involving the nervous system (R29.898);Muscle weakness (generalized) (M62.81)     Time: 7544-9201 PT Time Calculation (min) (ACUTE ONLY): 42 min  Charges:  $Therapeutic Exercise: 8-22 mins $Therapeutic Activity: 23-37 mins                     Allisa Einspahr P, PT Acute Rehabilitation Services Pager: [phone] Office: [phone]    Sandy Salaam Vali Capano 12/16/2020, 9:54 AM

## 2020-12-16 NOTE — TOC Progression Note (Signed)
Transition of Care (TOC) - Progression Note    Patient Details  Name: Jackson Figueroa MRN: [id number] Date of Birth: 02/27/1961  Transition of Care (TOC) CM/SW Contact   R Stubbldfield, RN Phone Number: 12/16/2020, 1:05 PM  Clinical Narrative:    Case management met with the patient at the bedside regarding transitions of care.  The patient is hopeful to be evaluated by CIR today, considering he has not received any bed offers for SNf placement in the area.  The patient is doing better with PT/OT and was able to stand with OT today.  The patient states that he lives with his girfriend, Jackson Figueroa, at the home who is home during the day.  I called [facility] and patient was set up with a hospital follow up appointment - noted in discharge instructions.  Case management and MSW with Difficult to Place Team will continue to follow the patient for transition of care needs.   Expected Discharge Plan: Skilled Nursing Facility Barriers to Discharge: Continued Medical Work up  Expected Discharge Plan and Services Expected Discharge Plan: Skilled Nursing Facility   Discharge Planning Services: CM Consult   Living arrangements for the past 2 months: Single Family Home                                       Social Determinants of Health (SDOH) Interventions    Readmission Risk Interventions No flowsheet data found.  

## 2020-12-16 NOTE — Progress Notes (Signed)
Inpatient Rehabilitation Admissions Coordinator   I was notified by PT today of pt's improvement in function. I will meet him today for full assessment for possible CIR candidacy.  Danne Baxter, RN, MSN Rehab Admissions Coordinator [phone] 12/16/2020 10:04 AM

## 2020-12-16 NOTE — Progress Notes (Signed)
NAME:  Jackson Figueroa, MRN:  [id number], DOB:  05-07-1961, LOS: 65 ADMISSION DATE:  10/20/2020, CONSULTATION DATE:  11/29 REFERRING MD:  Sabra Heck, CHIEF COMPLAINT:  ARDS COVID 72   Brief History:  60 year old male with no significant past medical history and a positive COVID-19 test on 11/26 presented to [facility] with profound hypoxemia on 11/28. Intubated immediately upon arrival found to be in ARDS. Transferred to [facility] for ongoing ICU care  11/29.  Past Medical History:  Obesity   Significant Hospital Events:  11/28 Presented to [facility] ER, intubated for hypoxemia, empiric abx, shock on levo. COVID Rx 11/29 Persistent shock, central line placed, ARDS protocol continued, proning initiated 11/30 Pressor requirements continued, NMB initiated to facilitate ventilation tube feeds started, initiated proning 12/01 Stopping NMB, proning positioning protocol discontinued, free water for rising creatinine and sodium 12/02 PEEP trending down. Renal function improved. Changed to versed from prop gtt d/t rising triglycerides  12/03 Remdesivir completed. Peep/FIO2 stable. Free water adjusted. Solumedrol reduced, fentanyl to Dilaudid, adding Oxycodone 12/04 Dyssynchrony, given paralytic overnight, changed to ketamine; vomited / aspirated, restarted paralytics 12/06 Proned, under NMB 12/07 Proning, NMB continued. Switched antibiotics for stenotrophomonas 12/13 Stopped oxycodone, added dilaudid oral, continuous nimbex infusion to prn, palliative care consulted 12/14 Changed to pressure control 12/15 Worsening vent synchrony after clamping OG tube 12/18 added reglan IV for ? Ileus developing  12/18 back on paralytics for "guppy breathing/ air stacking" on PCV ? ,mucus plug  12/19  Back on PCV and off paralytics  12/20 having intermittent fevers, atrial fibrillation with some ST changes.  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 Started on minocycline per ID 12/22 stable decreased steroid dosing. Clonazepam added 12/23 trach placed. Versed off. Changed dilaudid from 4mg -.3mg , added clonidine and increased depakote. Added midodrine. OGT out during surg. Post-pyloric dislodged. Tip in stomach. Attempting trickle feeds w/ reglan  12/24 NG tube placed by IR 12/25 Dilaudid weaned to off, tolerated trach collar 12/26 tolerated weaning for about 4 hours on trach collar 12/30 G tube with IR 1/14 downsized to #6 cuffless 1/17 room air  Consults:   Nephrology 12/14 Palliative 12/13 ID signed off 12/17  Procedures:  ETT 11/28 >>trach 12/23 Bates CVL 11/29 >> 12/9 R PICC 12/9 >>  R IJ HD Cath 12/17 >> G tube 12/30>>  Significant Diagnostic Tests:   ECHO 11/29 >>LVEF 50-55%, mild LVH, RV systolic function mildly reduced. Mild dilation of ascending aorta 69mm. LE Venous Duplex 12/8 >> negative for DVT   Micro Data:   COVID 11/28 >> Positive  Influenza A/B 11/28 >> negative  UC 11/28 >> negative  BCx2 11/28 >> negative  Tracheal aspirate 12/5 >> Stenotrophomonas maltophilia >> S-bactrim, levofloxacin UC 12/11 >> negative  Tracheal aspirate 12/11 >> Stenotrophomonas maltophilia >> S-bactrim, levofloxacin  BCx2 12/11 >> negative  Trach asp 12/ 19 >>> multidrug resistance stenotrophomonas Tracheal stoma 12/28> MDR moderate strep mitis/oralis, rare steno  Antimicrobials:   CTX 11/28 > 11/29 Azithromycin 11/28 > 11/29 Remdesivir 11/28 > 12/2 Prednisone 11/28 >  Baricitinib 12/4 > 12/5 Vanco 12/5 >> 12/7, 12/11 x1 Cefepime 12/5 >> 12/7, 12/11 >> 12/13 Levaquin 12/7 >> 12/16 Minocycline 12/21>12/28, 12/31>>  1/13 Rocephin 12/31>>  Interim History / Subjective:  Pt able to stand with OT, reported plan for ENT to remove trach  today  Objective  Blood pressure 124/86, pulse 89, temperature 98.6 F (37 C), temperature source Oral, resp. rate 20, height 6' (1.829 m), weight 110.9 kg, SpO2 100 %.    FiO2 (%):  [21 %] 21 %   Intake/Output Summary (Last 24 hours) at 12/16/2020 1114 Last data filed at 12/16/2020 0941 Gross per 24 hour  Intake 10 ml  Output 1450 ml  Net -1440 ml   Filed Weights   11/19/20 0500 11/21/20 0447 11/22/20 0500  Weight: 108.3 kg 108.5 kg 110.9 kg    Examination: General:  Generally well-appearing M sitting up in chair in no distress HEENT: MM pink/moist, trach in place Neuro: awake, alert, conversational CV: s1s2 rrr, no m/r/g PULM:  Clear bilaterally GI: soft, bsx4 active  Extremities: warm/dry, no edema  Skin: no rashes or lesions    Resolved Hospital Problem list   Oliguric AKI - improved. Renal signed off.   Assessment & Plan:    Acute hypoxemic and hypercapnic respiratory failure ARDS due to COVID-19 Continuing to improve P: -continue routine trach care, maintain #6 cuffless removal per ENT -PMV  Acute metabolic encephalopathy: improved Continue PT/OT  MDR stenotrophomonas pneumonia > off minocycline Monitor off abx  Best practice (evaluated daily)  Per [facility]   CRITICAL CARE Performed by: Otilio Carpen Clemon Devaul   Total critical care time: 30 minutes  Critical care time was exclusive of separately billable procedures and treating other patients.    Otilio Carpen Lakendria Nicastro, PA-C [facility]

## 2020-12-16 NOTE — Progress Notes (Signed)
  Speech Language Pathology Treatment: Dysphagia;Passy Muir Speaking valve  Patient Details Name: Jackson Figueroa MRN: [id number] DOB: [birth year] Today's Date: 12/16/2020 Time: 0712-1975 SLP Time Calculation (min) (ACUTE ONLY): 17 min  Assessment / Plan / Recommendation Clinical Impression  Pt in bed wearing PMV and states he sleeps with it and did not know removal at night was recommended. His vocal quality was normal and intelligible and vital signs and respirations stable. Pt able to recall that plan is to cap his trach. RT arrived during session with cap, placed and he tolerated well with 95% consistently.  Overall,he was independent during swallow intervention. He masticated and propelled trial of solid texture with complete oral clearance and independently used tongue sweep. He recalled effortful swallow strategy and stated he was executing this and denied globus pharyngeal sensation. Before upgrading to regular, recommend brief period with Dys 3 texture, continue thin liquids.    HPI HPI: Pt is a 60 y.o. with PMH obesity who was admitted 11/28 with respiratory distress and unresponsive at home with Covid 19. Pt with AKI and HD dependent. CRRT 12/16-12/26. Intubated 11/28 until trach 12/23. Encephalopathy since admission. PEG 12/30. 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      SLP Plan  Continue with current plan of care       Recommendations  Diet recommendations: Dysphagia 3 (mechanical soft);Thin liquid Liquids provided via: Cup;Straw Medication Administration: Whole meds with puree Supervision: Patient able to self feed Compensations: Slow rate;Small sips/bites;Effortful swallow;Follow solids with liquid Postural Changes and/or Swallow Maneuvers: Seated upright 90 degrees      Patient may use Passy-Muir Speech Valve:  (trach now capped)         General recommendations: Rehab consult Oral Care  Recommendations: Oral care BID Follow up Recommendations: Inpatient Rehab SLP Visit Diagnosis: Dysphagia, unspecified (R13.10);Aphonia (R49.1) Plan: Continue with current plan of care                      Houston Siren 12/16/2020, 3:52 PM  Orbie Pyo Colvin Caroli.Ed Risk analyst [phone] Office [phone]

## 2020-12-16 NOTE — Plan of Care (Signed)
  Problem: Education: Goal: Knowledge of General Education information will improve Description: Including pain rating scale, medication(s)/side effects and non-pharmacologic comfort measures Outcome: Progressing   Problem: Clinical Measurements: Goal: Diagnostic test results will improve Outcome: Progressing   Problem: Clinical Measurements: Goal: Respiratory complications will improve Outcome: Progressing   Problem: Clinical Measurements: Goal: Cardiovascular complication will be avoided Outcome: Progressing   Problem: Respiratory: Goal: Will maintain a patent airway Outcome: Progressing   Problem: Respiratory: Goal: Complications related to the disease process, condition or treatment will be avoided or minimized Outcome: Progressing

## 2020-12-16 NOTE — Progress Notes (Signed)
[facility] PROGRESS NOTE  Jackson Figueroa JTT:[id number] DOB: [birth year] DOA: 10/20/2020 PCP: Patient, No Pcp Per       Status: Remains inpatient appropriate because:Unsafe d/c plan and Inpatient level of care appropriate due to severity of illness   Dispo: The patient is from: Home              Anticipated d/c is to: SNF              Anticipated d/c date is: > 3 days              Patient currently is medically stable to d/c.  Barriers to discharge: Patient profoundly deconditioned after severe COVID-pneumonia illness that required mechanical ventilation, trach and PEG.  He is clinically improving and likely will have tracheostomy tube removed soon.  He is tolerating a regular diet but will definitely need aggressive physical rehabilitation before he can return home to independent living.   Code Status: Full Family Communication: Patient only DVT prophylaxis: SQ heparin Vaccination status: Has not been vaccinated against COVID.  But has been admitted for COVID-pneumonia 10/18/2020.  Will be eligible for COVID vaccination on 01/18/2021  Foley catheter: No-external urinary catheter  HPI: 60 year old male patient with no significant past medical history was admitted to [facility] on 11/28 with severe hypoxemic respiratory failure secondary to COVID-pneumonia with ARDS.  He initially tested positive on 11/26.  Subsequently he was intubated on 11/29 and transferred to [facility].  Was unable to be weaned from the ventilator and a tracheostomy was placed on 12/23.  Since that time he has been liberated from the ventilator and weaned to room air and tolerating PMV on trach.  Course also complicated by severe acute kidney injury secondary to ATN in context of septic shock and COVID physiology.  He did require CRRT and then intermittent hemodialysis with last dialysis treatment on 12/28.  Temporary dialysis catheter has been removed and renal function has stabilized.  He also  required a PEG tube and is now tolerating solid diet.  Transferred out to a regular medical bed on 1/10.   Subjective: In chair.  States he stood for the first time today and is quite excited.  Agreeable to possible further rehabilitative therapies and CIR. verbalized.  Objective: Vitals:   12/16/20 0440 12/16/20 0750  BP: 107/84 124/86  Pulse: 89 89  Resp: 20 20  Temp: 98.6 F (37 C) 98.6 F (37 C)  SpO2: 98% 100%    Intake/Output Summary (Last 24 hours) at 12/16/2020 0092 Last data filed at 12/16/2020 0155 Gross per 24 hour  Intake --  Output 1450 ml  Net -1450 ml   Filed Weights   11/19/20 0500 11/21/20 0447 11/22/20 0500  Weight: 108.3 kg 108.5 kg 110.9 kg    Exam:  Constitutional: Alert, no acute distress, calm Respiratory: Lateral lung sounds clear to auscultation, #6 cuffless trach-PMV in place-patient phonating easily Cardiovascular:.  Heart sounds S1-S2, pulses regular nontachycardic at rest, extremities warm to touch with adequate capillary refill Abdomen: Soft and nontender.  Bowel sounds present.  Tolerating oral diet. Neurologic: CN 2-12 grossly intact. Sensation intact, Strength 3/5 x all 4 extremities.  Psychiatric: Normal judgment and insight. Alert and oriented x 3. Normal mood.    Assessment/Plan: Acute problems: Acute hypoxic and hypercapnic respiratory failure secondary to COVID-19 ARDS -Stable on room air, trach now capped, anticipate removal soon -1/24 have contacted trach team to determine readiness for decannulation  Acute metabolic encephalopathy secondary to recent  ICU stay and COVID -Mentation is currently appropriate -On low-dose nocturnal Seroquel which can likely be tapered and discontinued soon  Profound physical deconditioning/persistent tachycardia -PT OT have evaluated patient and recommend SNF versus CIR -Patient would have limited resources at home in regards to assisting with physical needs after discharge -Currently  deconditioned enough that even sitting up in the bed eating causes extreme fatigue -Continue low-dose beta-blocker-this admission unremarkable  Sacral deep tissue injury/upper medial thigh maceration/DTI toes Pressure Injury 10/29/20 Sacrum Mid Deep Tissue Pressure Injury - Purple or maroon localized area of discolored intact skin or blood-filled blister due to damage of underlying soft tissue from pressure and/or shear. purple (Active)  Date First Assessed/Time First Assessed: 10/29/20 1300   Location: Sacrum  Location Orientation: Mid  Staging: Deep Tissue Pressure Injury - Purple or maroon localized area of discolored intact skin or blood-filled blister due to damage of underlying ...    Assessments 10/29/2020  4:05 PM 12/15/2020  8:00 AM  Dressing Type -- Foam - Lift dressing to assess site every shift  Dressing -- Changed  Wound Length (cm) 2 cm --  Wound Width (cm) 0.5 cm --  Wound Depth (cm) 0 cm --  Wound Surface Area (cm^2) 1 cm^2 --  Wound Volume (cm^3) 0 cm^3 --  Drainage Amount -- None     No Linked orders to display     Pressure Injury 11/06/20 Nose Right Unstageable - Full thickness tissue loss in which the base of the injury is covered by slough (yellow, tan, gray, green or brown) and/or eschar (tan, brown or black) in the wound bed. Red/black area above R nare (Active)  Date First Assessed/Time First Assessed: 11/06/20 1656   Location: Nose  Location Orientation: Right  Staging: Unstageable - Full thickness tissue loss in which the base of the injury is covered by slough (yellow, tan, gray, green or brown) and/or esc...    Assessments 11/06/2020  4:57 PM 12/05/2020  2:00 PM  Dressing Type None None  Site / Wound Assessment Clean;Dry;Black;Red Clean  Peri-wound Assessment Intact Intact  Wound Length (cm) 1 cm --  Wound Width (cm) 0.75 cm --  Wound Depth (cm) 0 cm --  Wound Surface Area (cm^2) 0.75 cm^2 --  Wound Volume (cm^3) 0 cm^3 --     No Linked orders to display      Wound / Incision (Open or Dehisced) 11/18/20 (MASD) Moisture Associated Skin Damage Buttocks Left (Active)  Date First Assessed/Time First Assessed: 11/18/20 0815   Wound Type: (MASD) Moisture Associated Skin Damage  Location: Buttocks  Location Orientation: Left  Present on Admission: No    Assessments 11/18/2020  8:15 AM 12/15/2020  8:00 AM  Dressing Type Moisture barrier Moisture barrier  Dressing Changed -- Changed  Dressing Status None None  Dressing Change Frequency -- Daily  Site / Wound Assessment Red;Friable;Pink;Bleeding --  Wound Length (cm) 4 cm --  Wound Width (cm) 4 cm --  Wound Depth (cm) 0 cm --  Wound Volume (cm^3) 0 cm^3 --  Wound Surface Area (cm^2) 16 cm^2 --  Margins Unattached edges (unapproximated) --  Drainage Amount Scant Scant  Drainage Description Sanguineous Sanguineous  Treatment Cleansed;Other (Comment) --     No Linked orders to display     Wound / Incision (Open or Dehisced) 11/18/20 Skin tear Vertebral column Upper skin tear (Active)  Date First Assessed/Time First Assessed: 11/18/20 2300   Wound Type: Skin tear  Location: Vertebral column  Location Orientation: Upper  Wound  Description (Comments): skin tear  Present on Admission: (c) Yes    Assessments 11/18/2020 11:30 PM 12/05/2020  2:00 PM  Dressing Type Foam - Lift dressing to assess site every shift Foam - Lift dressing to assess site every shift  Dressing Changed New --  Dressing Status Clean;Dry;Intact Clean;Dry;Intact  Dressing Change Frequency PRN PRN  Site / Wound Assessment Pale;Pink Pale;Pink  Peri-wound Assessment -- Intact  Wound Length (cm) 6 cm --  Wound Width (cm) 1 cm --  Wound Depth (cm) 0 cm --  Wound Volume (cm^3) 0 cm^3 --  Wound Surface Area (cm^2) 6 cm^2 --  Margins Unattached edges (unapproximated) Unattached edges (unapproximated)  Drainage Amount None None  Treatment -- Cleansed     No Linked orders to display     Pressure Injury 11/18/20 Toe (Comment  which one)  Anterior;Right Deep Tissue Pressure Injury - Purple or maroon localized area of discolored intact skin or blood-filled blister due to damage of underlying soft tissue from pressure and/or shear. DTI left  (Active)  Date First Assessed/Time First Assessed: 11/18/20 2330   Location: (c) Toe (Comment  which one)  Location Orientation: Anterior;Right  Staging: Deep Tissue Pressure Injury - Purple or maroon localized area of discolored intact skin or blood-filled bli...    Assessments 11/18/2020 11:30 PM 12/15/2020  8:00 AM  Dressing Type None None  Dressing -- Changed;Other (Comment)  Dressing Change Frequency -- Daily  State of Healing Eschar --  Site / Wound Assessment Clean;Dry --  Wound Length (cm) 4 cm --  Wound Width (cm) 3 cm --  Wound Depth (cm) 0 cm --  Wound Surface Area (cm^2) 12 cm^2 --  Wound Volume (cm^3) 0 cm^3 --  Drainage Amount None None     No Linked orders to display     Wound / Incision (Open or Dehisced) 12/02/20 Skin tear Penis red, pink (Active)  Date First Assessed/Time First Assessed: 12/02/20 0800   Wound Type: Skin tear  Location: Penis  Wound Description (Comments): red, pink    Assessments 12/02/2020  8:00 AM 12/05/2020  2:00 PM  Dressing Type None None  Site / Wound Assessment Painful;Pink;Red Pale;Pink  Peri-wound Assessment Intact Intact  Drainage Amount None None  Treatment Cleansed Cleansed     No Linked orders to display      Other problems: Acute kidney injury secondary to sepsis physiology from COVID and ATN -Briefly required CRRT and intermittent hemodialysis -Dialysis with stable renal function  MDR stenotrophomonas pneumonia -Has completed all antibiotics but remains on contact isolation  Dysphagia -Resolved, tolerating regular diet   Data Reviewed: Basic Metabolic Panel: Recent Labs  Lab 12/10/20 0335  NA 135  K 4.5  CL 101  CO2 23  GLUCOSE 107*  BUN 49*  CREATININE 1.01  CALCIUM 8.8*  MG 1.8  PHOS 4.6   Liver Function  Tests: No results for input(s): AST, ALT, ALKPHOS, BILITOT, PROT, ALBUMIN in the last 168 hours. No results for input(s): LIPASE, AMYLASE in the last 168 hours. No results for input(s): AMMONIA in the last 168 hours. CBC: Recent Labs  Lab 12/10/20 0335  WBC 8.2  NEUTROABS 5.7  HGB 10.0*  HCT 32.0*  MCV 104.2*  PLT 221   Cardiac Enzymes: No results for input(s): CKTOTAL, CKMB, CKMBINDEX, TROPONINI in the last 168 hours. BNP (last 3 results) Recent Labs    10/20/20 2043  BNP 47.0    ProBNP (last 3 results) No results for input(s): PROBNP in the last 8760  hours.  CBG: Recent Labs  Lab 12/15/20 1712 12/15/20 1927 12/15/20 2350 12/16/20 0435 12/16/20 0739  GLUCAP 135* 98 133* 76 87    No results found for this or any previous visit (from the past 240 hour(s)).   Studies: No results found.  Scheduled Meds: . chlorhexidine gluconate (MEDLINE KIT)  15 mL Mouth Rinse BID  . Chlorhexidine Gluconate Cloth  6 each Topical Q0600  . docusate sodium  50 mg Oral BID  . famotidine  20 mg Oral BID  . feeding supplement  237 mL Oral TID BM  . heparin injection (subcutaneous)  5,000 Units Subcutaneous Q8H  . mouth rinse  15 mL Mouth Rinse BID  . metoprolol tartrate  12.5 mg Oral BID  . multivitamin with minerals  1 tablet Oral Daily  . QUEtiapine  25 mg Oral QHS  . sodium chloride flush  10-40 mL Intracatheter Q12H   Continuous Infusions: . 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  Last dialysis on 12/28.   Antibiotics: Anti-infectives (From admission, onward)   Start     Dose/Rate Route Frequency Ordered Stop   11/22/20 1200  cefTRIAXone (ROCEPHIN) 2 g in sodium chloride 0.9 % 100 mL IVPB        2 g 200 mL/hr over 30 Minutes Intravenous Every 24 hours 11/22/20 1110 11/28/20 1323   11/22/20 1000  minocycline (MINOCIN) capsule 100 mg        100 mg Per Tube Every 12 hours 11/22/20 0754 12/05/20 0951   11/21/20 1454  ceFAZolin (ANCEF) powder          As needed 11/21/20 1454 11/21/20 1454   11/21/20 1448  ceFAZolin (ANCEF) 2-4 GM/100ML-% IVPB       Note to Pharmacy: Arlean Hopping   : cabinet override      11/21/20 1448 11/22/20 0259   11/15/20 1130  minocycline (MINOCIN) capsule 100 mg        100 mg Per Tube Every 12 hours 11/15/20 1030 11/20/20 0751   11/14/20 1700  doxycycline (VIBRAMYCIN) 100 mg in sodium chloride 0.9 % 250 mL IVPB  Status:  Discontinued        100 mg 125 mL/hr over 120 Minutes Intravenous 2 times daily 11/14/20 1621 11/15/20 1029   11/13/20 0500  minocycline (MINOCIN) capsule 100 mg  Status:  Discontinued        100 mg Per Tube 2 times daily 11/12/20 1511 11/14/20 1619   11/12/20 1600  minocycline (MINOCIN) capsule 200 mg        200 mg Per Tube  Once 11/12/20 1511 11/12/20 1604   11/03/20 1600  levofloxacin (LEVAQUIN) IVPB 750 mg        750 mg 100 mL/hr over 90 Minutes Intravenous Every 48 hours 11/02/20 1010 11/05/20 1752   11/02/20 1015  ceFEPIme (MAXIPIME) 2 g in sodium  chloride 0.9 % 100 mL IVPB  Status:  Discontinued        2 g 200 mL/hr over 30 Minutes Intravenous Every 12 hours 11/02/20 0926 11/04/20 1321   11/02/20 1015  vancomycin (VANCOREADY) IVPB 2000 mg/400 mL        2,000 mg 200 mL/hr over 120 Minutes Intravenous  Once 11/02/20 0926 11/02/20 1414    11/02/20 1011  vancomycin variable dose per unstable renal function (pharmacist dosing)  Status:  Discontinued         Does not apply See admin instructions 11/02/20 1011 11/03/20 1055   10/29/20 1500  levofloxacin (LEVAQUIN) IVPB 750 mg  Status:  Discontinued        750 mg 100 mL/hr over 90 Minutes Intravenous Every 24 hours 10/29/20 1400 11/02/20 1010   10/27/20 2300  vancomycin (VANCOCIN) IVPB 1000 mg/200 mL premix  Status:  Discontinued        1,000 mg 200 mL/hr over 60 Minutes Intravenous Every 12 hours 10/27/20 1352 10/28/20 1442   10/27/20 1800  ceFEPIme (MAXIPIME) 2 g in sodium chloride 0.9 % 100 mL IVPB  Status:  Discontinued        2 g 200 mL/hr over 30 Minutes Intravenous Every 8 hours 10/27/20 1345 10/29/20 1400   10/27/20 1015  vancomycin (VANCOREADY) IVPB 2000 mg/400 mL        2,000 mg 200 mL/hr over 120 Minutes Intravenous STAT 10/27/20 0925 10/27/20 1247   10/27/20 1000  ceFEPIme (MAXIPIME) 2 g in sodium chloride 0.9 % 100 mL IVPB        2 g 200 mL/hr over 30 Minutes Intravenous STAT 10/27/20 0925 10/27/20 1043   10/21/20 1000  remdesivir 100 mg in sodium chloride 0.9 % 100 mL IVPB       "Followed by" Linked Group Details   100 mg 200 mL/hr over 30 Minutes Intravenous Daily 10/20/20 2224 10/24/20 1101   10/20/20 2315  vancomycin (VANCOCIN) IVPB 1000 mg/200 mL premix        1,000 mg 200 mL/hr over 60 Minutes Intravenous  Once 10/20/20 2309 10/21/20 0223   10/20/20 2315  piperacillin-tazobactam (ZOSYN) IVPB 3.375 g        3.375 g 100 mL/hr over 30 Minutes Intravenous  Once 10/20/20 2309 10/21/20 0021   10/20/20 2300  remdesivir 100 mg in sodium chloride 0.9 % 100 mL IVPB       "Followed by" Linked Group Details   100 mg 200 mL/hr over 30 Minutes Intravenous Every 30 min 10/20/20 2224 10/21/20 0124   10/20/20 2045  cefTRIAXone (ROCEPHIN) 2 g in sodium chloride 0.9 % 100 mL IVPB  Status:  Discontinued        2 g 200 mL/hr over 30 Minutes Intravenous Every 24 hours  10/20/20 2043 10/21/20 1028   10/20/20 2045  azithromycin (ZITHROMAX) 500 mg in sodium chloride 0.9 % 250 mL IVPB  Status:  Discontinued        500 mg 250 mL/hr over 60 Minutes Intravenous Every 24 hours 10/20/20 2043 10/21/20 1028       Time spent: 25 minutes    Erin Hearing ANP  [facility] 7 am - 330 pm/M-F for direct patient care and secure chat Please refer to Amion for contact info 57  days

## 2020-12-16 NOTE — Progress Notes (Signed)
   12/16/20 1524  Therapy Vitals  Pulse Rate 84  Resp 14  Patient Position (if appropriate) Lying  MEWS Score/Color  MEWS Score 0  MEWS Score Color Green  Respiratory Assessment  Assessment Type Assess only  Respiratory Pattern Regular;Unlabored  Chest Assessment Chest expansion symmetrical  Cough None  Bilateral Breath Sounds Clear  Oxygen Therapy/Pulse Ox  $ Pulse Ox Multi Deter  Yes  O2 Device (S)  Room Air (Capping trials initiated.)  SpO2 95 %  Safety Instructions Yes (Comment)  Respiratory  Airway LDA Tracheostomy

## 2020-12-16 NOTE — Progress Notes (Signed)
Inpatient Rehabilitation Admissions Coordinator    I met at bedside with patient to discuss goals and expectations of a possible CIR admit. Patient prefers CIR rather than SNF. His girlfriend can provide supervision to light min assist at d/c. I will contact Acute therapy to assist with obtaining updated OT assessments and recommendations before pursuing insurance approval for a possible Cir admit. I will discuss with Dr. Swartz.   , RN, MSN Rehab Admissions Coordinator [phone] 12/16/2020 1:23 PM  

## 2020-12-17 ENCOUNTER — Inpatient Hospital Stay (HOSPITAL_COMMUNITY): Payer: BC Managed Care – PPO

## 2020-12-17 DIAGNOSIS — J80 Acute respiratory distress syndrome: Secondary | ICD-10-CM | POA: Diagnosis not present

## 2020-12-17 DIAGNOSIS — Z430 Encounter for attention to tracheostomy: Secondary | ICD-10-CM | POA: Diagnosis not present

## 2020-12-17 DIAGNOSIS — U071 COVID-19: Secondary | ICD-10-CM | POA: Diagnosis not present

## 2020-12-17 LAB — GLUCOSE, CAPILLARY
Glucose-Capillary: 101 mg/dL — ABNORMAL HIGH (ref 70–99)
Glucose-Capillary: 106 mg/dL — ABNORMAL HIGH (ref 70–99)
Glucose-Capillary: 109 mg/dL — ABNORMAL HIGH (ref 70–99)
Glucose-Capillary: 88 mg/dL (ref 70–99)
Glucose-Capillary: 94 mg/dL (ref 70–99)
Glucose-Capillary: 99 mg/dL (ref 70–99)

## 2020-12-17 NOTE — Plan of Care (Signed)
  Problem: Education: Goal: Knowledge of General Education information will improve Description Including pain rating scale, medication(s)/side effects and non-pharmacologic comfort measures Outcome: Progressing   

## 2020-12-17 NOTE — Progress Notes (Signed)
Inpatient Rehabilitation Admissions Coordinator  I contacted pt's girlfriend by phone to discuss CIR rehab venue goals and expectations of a possible admit. She is in agreement. I will begin insurance authorization with [employer] for a possible admit pending their approval.  Danne Baxter, RN, MSN Rehab Admissions Coordinator [phone] 12/17/2020 11:55 AM

## 2020-12-17 NOTE — Plan of Care (Signed)
  Problem: Education: Goal: Knowledge of disease and its progression will improve Outcome: Progressing   Problem: Activity: Goal: Activity intolerance will improve Outcome: Progressing   Problem: Nutritional: Goal: Ability to make appropriate dietary choices will improve Outcome: Progressing

## 2020-12-17 NOTE — Progress Notes (Signed)
Pt. Requested to change bed from a sizewise to a regular bed. Change bed per pt. Request.

## 2020-12-17 NOTE — Progress Notes (Signed)
NAME:  Jackson Figueroa, MRN:  [id number], DOB:  1960/11/24, LOS: 28 ADMISSION DATE:  10/20/2020, CONSULTATION DATE:  11/29 REFERRING MD:  Sabra Heck, CHIEF COMPLAINT:  ARDS COVID 13   Brief History:  60 year old male with no significant past medical history and a positive COVID-19 test on 11/26 presented to [facility] with profound hypoxemia on 11/28. Intubated immediately upon arrival found to be in ARDS. Transferred to [facility] for ongoing ICU care  11/29.  Past Medical History:  Obesity   Significant Hospital Events:  11/28 Presented to [facility] ER, intubated for hypoxemia, empiric abx, shock on levo. COVID Rx 11/29 Persistent shock, central line placed, ARDS protocol continued, proning initiated 11/30 Pressor requirements continued, NMB initiated to facilitate ventilation tube feeds started, initiated proning 12/01 Stopping NMB, proning positioning protocol discontinued, free water for rising creatinine and sodium 12/02 PEEP trending down. Renal function improved. Changed to versed from prop gtt d/t rising triglycerides  12/03 Remdesivir completed. Peep/FIO2 stable. Free water adjusted. Solumedrol reduced, fentanyl to Dilaudid, adding Oxycodone 12/04 Dyssynchrony, given paralytic overnight, changed to ketamine; vomited / aspirated, restarted paralytics 12/06 Proned, under NMB 12/07 Proning, NMB continued. Switched antibiotics for stenotrophomonas 12/13 Stopped oxycodone, added dilaudid oral, continuous nimbex infusion to prn, palliative care consulted 12/14 Changed to pressure control 12/15 Worsening vent synchrony after clamping OG tube 12/18 added reglan IV for ? Ileus developing  12/18 back on paralytics for "guppy breathing/ air stacking" on PCV ? ,mucus plug  12/19  Back on PCV and off paralytics  12/20 having intermittent fevers, atrial fibrillation with some ST changes.  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 Started on minocycline per ID 12/22 stable decreased steroid dosing. Clonazepam added 12/23 trach placed. Versed off. Changed dilaudid from 4mg -.3mg , added clonidine and increased depakote. Added midodrine. OGT out during surg. Post-pyloric dislodged. Tip in stomach. Attempting trickle feeds w/ reglan  12/24 NG tube placed by IR 12/25 Dilaudid weaned to off, tolerated trach collar 12/26 tolerated weaning for about 4 hours on trach collar 12/30 G tube with IR 1/14 downsized to #6 cuffless 1/17 room air  Consults:   Nephrology 12/14 Palliative 12/13 ID signed off 12/17  Procedures:  ETT 11/28 >>trach 12/23 [place]>>> CVL 11/29 >> 12/9 R PICC 12/9 >>  R IJ HD Cath 12/17 >> G tube 12/30>>  Significant Diagnostic Tests:   ECHO 11/29 >>LVEF 50-55%, mild LVH, RV systolic function mildly reduced. Mild dilation of ascending aorta 76mm. LE Venous Duplex 12/8 >> negative for DVT   Micro Data:   COVID 11/28 >> Positive  Influenza A/B 11/28 >> negative  UC 11/28 >> negative  BCx2 11/28 >> negative  Tracheal aspirate 12/5 >> Stenotrophomonas maltophilia >> S-bactrim, levofloxacin UC 12/11 >> negative  Tracheal aspirate 12/11 >> Stenotrophomonas maltophilia >> S-bactrim, levofloxacin  BCx2 12/11 >> negative  Trach asp 12/ 19 >>> multidrug resistance stenotrophomonas Tracheal stoma 12/28> MDR moderate strep mitis/oralis, rare steno  Antimicrobials:   CTX 11/28 > 11/29 Azithromycin 11/28 > 11/29 Remdesivir 11/28 > 12/2 Prednisone 11/28 >  Baricitinib 12/4 > 12/5 Vanco 12/5 >> 12/7, 12/11 x1 Cefepime 12/5 >> 12/7, 12/11 >> 12/13 Levaquin 12/7 >> 12/16 Minocycline 12/21>12/28, 12/31>>  1/13 Rocephin 12/31>>off  Interim History / Subjective:  Tolerated trach capped overnight.  Had breakfast/lunch without difficulty.   Denies SOB, difficulty swallowing.  Voice strong,  good cough.   Objective   Blood pressure 116/74, pulse 93, temperature 97.9 F (36.6 C), temperature source Oral, resp. rate 17, height 6' (1.829 m), weight 110.9 kg, SpO2 95 %.        Intake/Output Summary (Last 24 hours) at 12/17/2020 1332 Last data filed at 12/17/2020 0617 Gross per 24 hour  Intake --  Output 1500 ml  Net -1500 ml   Filed Weights   11/19/20 0500 11/21/20 0447 11/22/20 0500  Weight: 108.3 kg 108.5 kg 110.9 kg    Examination: General:  Generally well-appearing M sitting up in bed, NAD  HEENT: MM pink/moist, trach in place, capped Neuro: awake, alert, conversational CV: s1s2 rrr, no m/r/g PULM:  resps even non labored on RA with trach capped, strong cough, strong voice  GI: soft, bsx4 active  Extremities: warm/dry, no edema  Skin: no rashes or lesions    Resolved Hospital Problem list   Oliguric AKI - improved. Renal signed off.   Assessment & Plan:   Admitted 11/28 with Covid pneumonia, course complicated by ARDS, prolonged mechanical ventilation requiring tracheostomy on 12/23 , MDR stenotrophomonas HAP   Acute hypoxemic and hypercapnic respiratory failure ARDS due to COVID-19 Continuing to improve P: - decannulate per RT  - pulmonary hygiene  - ongoing rehab efforts   Acute metabolic encephalopathy: improved Continue PT/OT  MDR stenotrophomonas pneumonia > off minocycline Monitor off abx  Best practice (evaluated daily)  Per [place], NP Pulmonary/Critical Care Medicine  12/17/2020  1:32 PM

## 2020-12-17 NOTE — Progress Notes (Signed)
Patient decannulated per MD order. Patient capped for over 24 hours prior to removal. Patients vitals are stable on room air. RN at bedside and aware.

## 2020-12-17 NOTE — Progress Notes (Signed)
Physical Therapy Treatment Patient Details Name: Jackson Figueroa MRN: [id number] DOB: December 16, 1960 Today's Date: 12/17/2020    History of Present Illness 60 yo admitted 11/28 with respiratory distress and unresponsive at home with Covid 19. Pt with AKI and HD dependent. CRRT 12/16-12/26. Trach 12/23. encephalopathy since admission. PEG 12/30.PMHx: obesity    PT Comments    Pt pleasant despite having been in chair longer than anticipated yesterday. Pt with continued improvement in all mobility with initiation of lateral scoots eOB today. Deferred OOB to chair due to sore sacrum. Pt with ability to perform supine bed mobility without assist and progressing with transfers. Pt educated for continued HEP and progression and eager to hear if CIR accepting.     Follow Up Recommendations  CIR;Supervision/Assistance - 24 hour     Equipment Recommendations  3in1 (PT);Wheelchair (measurements PT);Wheelchair cushion (measurements PT)    Recommendations for Other Services       Precautions / Restrictions Precautions Precaution Comments: PEG capped, trach capped, sacral wounds    Mobility  Bed Mobility Overal bed mobility: Needs Assistance Bed Mobility: Rolling;Sidelying to Sit;Sit to Supine Rolling: Min guard Sidelying to sit: Mod assist   Sit to supine: Mod assist   General bed mobility comments: pt able to roll bil and scoot himself up in bed with head rail with increased time. Transition to sitting required mod assist to elevate trunk and return to bed required assist to lift legs to surface.  Transfers Overall transfer level: Needs assistance   Transfers: Sit to/from Stand;Lateral/Scoot Transfers Sit to Stand: Mod assist;+2 safety/equipment;+2 physical assistance        Lateral/Scoot Transfers: Min assist General transfer comment: pt able to stand from EOB x 4 trials with knees blocked and assist of pad with mod-max assist pending fatigue. Max time in standing 6 sec with bil arms  hooked and cues to extend hips. EOB pt able to perform lateral scoots x 5 each direction with assist of pad with pt using bil UE  Ambulation/Gait             General Gait Details: not yet ready   Stairs             Wheelchair Mobility    Modified Rankin (Stroke Patients Only)       Balance     Sitting balance-Leahy Scale: Fair Sitting balance - Comments: EOB without physical support, bil UE assist for lateral scooting   Standing balance support: Bilateral upper extremity supported Standing balance-Leahy Scale: Poor Standing balance comment: bil UE support                            Cognition Arousal/Alertness: Awake/alert Behavior During Therapy: WFL for tasks assessed/performed Overall Cognitive Status: Within Functional Limits for tasks assessed                                        Exercises General Exercises - Lower Extremity Long Arc QuadSinclair Ship;Both;20 reps;Seated Hip ABduction/ADduction: Both;20 reps;AROM;Supine Hip Flexion/Marching: AAROM;Both;Seated;20 reps Toe Raises: AROM;Both;Seated;20 reps Heel Raises: Both;20 reps;Seated;AROM    General Comments        Pertinent Vitals/Pain      Home Living                      Prior Function  PT Goals (current goals can now be found in the care plan section) Progress towards PT goals: Progressing toward goals    Frequency    Min 3X/week      PT Plan Current plan remains appropriate    Co-evaluation              AM-PAC PT "6 Clicks" Mobility   Outcome Measure  Help needed turning from your back to your side while in a flat bed without using bedrails?: A Little Help needed moving from lying on your back to sitting on the side of a flat bed without using bedrails?: A Lot Help needed moving to and from a bed to a chair (including a wheelchair)?: A Lot Help needed standing up from a chair using your arms (e.g., wheelchair or bedside  chair)?: A Lot Help needed to walk in hospital room?: Total Help needed climbing 3-5 steps with a railing? : Total 6 Click Score: 11    End of Session Equipment Utilized During Treatment: Gait belt Activity Tolerance: Patient tolerated treatment well Patient left: in bed;with call bell/phone within reach;with bed alarm set Nurse Communication: Mobility status;Need for lift equipment PT Visit Diagnosis: Other abnormalities of gait and mobility (R26.89);Difficulty in walking, not elsewhere classified (R26.2);Other symptoms and signs involving the nervous system (R29.898);Muscle weakness (generalized) (M62.81)     Time: 4081-4481 PT Time Calculation (min) (ACUTE ONLY): 32 min  Charges:  $Therapeutic Exercise: 8-22 mins $Therapeutic Activity: 8-22 mins                     Prentiss Hammett P, PT Acute Rehabilitation Services Pager: [phone] Office: [place] 12/17/2020, 9:19 AM

## 2020-12-17 NOTE — Progress Notes (Signed)
PROGRESS NOTE    Jackson Figueroa  QVZ:[id number] DOB: 13-Jul-1961 DOA: 10/20/2020 PCP: Patient, No Pcp Per    Brief Narrative:  60 year old male with no significant past medical history was admitted to [facility] with on 11/28 with severe hypoxic respiratory failure, ARDS/Covid pneumonia, he tested positive on 11/26 PTA. Significant events, 11/29, intubated on arrival and transfer to [facility].  Prolonged respiratory failure. 12/23, eventual tracheostomy, gradually weaning and now almost on room air with trach. 12/30, PEG tube placed.  Tolerating tube feeding.  Also started eating dysphagia diet. 12/16 -32/95, course complicated by severe AKI due to ATN from septic shock and COVID, required CRRT and then intermittent hemodialysis.  Last hemodialysis on 12/28.  Hemodialysis catheter removed and now renal functions improved. 1/10, transferred to general medical bed. 1/ 14, tracheostomy downsized to #6 cuffless. 1/17, remains on room air since then. 1/20, patient able to eat dysphagia 2 diet.  Currently on room air.  Highly motivated for rehab. 1/25, tracheostomy is capped with plan to remove today.  He is eating adequate food.  Waiting for bed at CIR.   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  Clinically improved.  Stable on trach collar and now on room  air. Seen by speech, passed swallow evaluation.  Patient consuming 100% of the meal by mouth, discontinue PEG tube feeding. Flush every day. Continue to work with PT OT.  Acute metabolic encephalopathy: Improved.  On low-dose Seroquel at night.  Tolerating well.  We will continue until rehab then will need gradual tapering off.  Acute renal failure with hypernatremia: Improved.  Normalized.  Patient received dialysis in the ICU.  MDR stenotrophomonas pneumonia: Completed antibiotic therapy.  Critical illness neuropathy/severe deconditioning: Needs aggressive rehab. He has sinus tachycardia, TSH is normal.  Rate controlled on metoprolol.  Echocardiogram with normal ejection fraction.  Pressure injuries of sacrum and toes, not present on admission: Local wound care.  Offered COVID-19 vaccine and declined.   DVT prophylaxis: heparin injection 5,000 Units Start: 11/21/20 2200   Code Status: Full code Family Communication: None, patient is communicating with his girlfriend. Disposition Plan: Status is: Inpatient  Remains inpatient appropriate because:Unsafe d/c plan   Dispo: The patient is from: Home              Anticipated d/c is to: Acute inpatient rehab.              Anticipated d/c date is: When bed is available.              Patient currently is medically stable to d/c.  Medically stable to DC to next level of care.    Consultants:   PCCM  Nephrology  Palliative medicine  Procedures:   Tracheostomy 12/23  PEG tube placement 12/30  Intermittent hemodialysis and now off.  Last dialysis on 12/28.  Antimicrobials:   Completed all antibiotics.  Subjective: Patient seen and examined.  Lurline Idol is capped and he is having no problem.  Looking forward to go to rehab.  Objective: Vitals:   12/16/20 2043 12/16/20 2338 12/17/20 0338 12/17/20 0412  BP: 115/76   116/74  Pulse: 90 82 92 93  Resp: _0 Temp:    97.9 F (36.6 C)  TempSrc:    Oral  SpO2: 95%    95%  Weight:      Height:        Intake/Output Summary (Last 24 hours) at 12/17/2020 1139 Last data filed at 12/17/2020 0617 Gross per 24 hour  Intake -  Output 1500 ml  Net -1500 ml   Filed Weights   11/19/20 0500 11/21/20 0447 11/22/20 0500  Weight: 108.3 kg 108.5 kg 110.9 kg    Examination:  Physical Exam Constitutional:      Appearance: Normal appearance.     Comments: Pretty debilitated gentleman.  On room air.  HENT:     Head: Normocephalic.     Comments: Trach collar, on room air. Cardiovascular:     Rate and Rhythm: Regular rhythm.  Pulmonary:     Effort: Pulmonary effort is normal.     Breath sounds: Normal breath sounds.  Abdominal:     Comments:  PEG tube site clean and dry.   Neurological:     Mental Status: He is alert and oriented to person, place, and time.     Comments: Generalized weakness.   He has weakness of all muscle groups.    Data Reviewed: I have personally reviewed following labs and imaging studies  CBC: No results for input(s): WBC, NEUTROABS, HGB, HCT, MCV, PLT in the last 168 hours. Basic Metabolic Panel: No results for input(s): NA, K, CL, CO2, GLUCOSE, BUN, CREATININE, CALCIUM, MG, PHOS in the last 168 hours. GFR: Estimated Creatinine Clearance: 103.8 mL/min (by C-G formula based on SCr of 1.01 mg/dL). Liver Function Tests: No results for input(s): AST, ALT, ALKPHOS, BILITOT, PROT, ALBUMIN in the last 168 hours. No results for input(s): LIPASE, AMYLASE in the last 168 hours. No results for input(s): AMMONIA in the last 168 hours. Coagulation Profile: No results for input(s): INR, PROTIME in the last 168 hours. Cardiac Enzymes: No results for input(s): CKTOTAL, CKMB, CKMBINDEX, TROPONINI in the last 168 hours. BNP (last 3 results) No results for input(s): PROBNP in the last 8760 hours. HbA1C: No results for input(s): HGBA1C in the last 72 hours. CBG: Recent Labs  Lab 12/16/20 1501 12/16/20 1911 12/16/20 2258  12/17/20 0315 12/17/20 0723  GLUCAP 141* 76 100* 88 99   Lipid Profile: No results for input(s): CHOL, HDL, LDLCALC, TRIG, CHOLHDL, LDLDIRECT in the last 72 hours. Thyroid Function Tests: No results for input(s): TSH, T4TOTAL, FREET4, T3FREE, THYROIDAB in the last 72 hours. Anemia Panel: No results for input(s): VITAMINB12, FOLATE, FERRITIN, TIBC, IRON, RETICCTPCT in the last 72 hours. Sepsis Labs: No results for input(s): PROCALCITON, LATICACIDVEN in the last 168 hours.  No results found for this or any previous visit (from the past 240 hour(s)).       Radiology Studies: DG Chest Port 1 View  Result Date: 12/17/2020 CLINICAL DATA:  Acute respiratory failure.  History of COVID. EXAM: PORTABLE CHEST 1 VIEW COMPARISON:  11/27/2020. FINDINGS: Tracheostomy tube in stable position. Heart size normal. Persistent but improved bilateral interstitial prominence. No pleural effusion or pneumothorax. Degenerative change thoracic spine. IMPRESSION: 1. Tracheostomy tube in stable position. 2. Persistent but improved bilateral interstitial  prominence. Findings suggest improving interstitial edema and or pneumonitis. Electronically Signed   By: Phillips   On: 12/17/2020 06:34        Scheduled Meds: . chlorhexidine gluconate (MEDLINE KIT)  15 mL Mouth Rinse BID  . Chlorhexidine Gluconate Cloth  6 each Topical Q0600  . docusate sodium  50 mg Oral BID  . famotidine  20 mg Oral BID  . feeding supplement  237 mL Oral TID BM  . heparin injection (subcutaneous)  5,000 Units Subcutaneous Q8H  . mouth rinse  15 mL Mouth Rinse BID  . metoprolol tartrate  12.5 mg Oral BID  . multivitamin with minerals  1 tablet Oral Daily  . QUEtiapine  25 mg Oral QHS  . sodium chloride flush  10-40 mL Intracatheter Q12H   Continuous Infusions: . sodium chloride 10 mL/hr at 12/05/20 0600     LOS: 58 days    Time spent: 25 minutes    [name] Merino, MD [facility] Pager [phone]

## 2020-12-18 ENCOUNTER — Encounter (HOSPITAL_COMMUNITY): Payer: Self-pay | Admitting: Physical Medicine and Rehabilitation

## 2020-12-18 ENCOUNTER — Inpatient Hospital Stay (HOSPITAL_COMMUNITY)
Admission: RE | Admit: 2020-12-18 | Discharge: 2021-01-24 | DRG: 945 | Disposition: A | Discharge status: Home / Self Care | Payer: BC Managed Care – PPO | Source: Intra-hospital | Attending: Physical Medicine and Rehabilitation | Admitting: Physical Medicine and Rehabilitation

## 2020-12-18 ENCOUNTER — Other Ambulatory Visit: Payer: Self-pay

## 2020-12-18 ENCOUNTER — Encounter (HOSPITAL_COMMUNITY): Payer: Self-pay | Admitting: Specialist

## 2020-12-18 DIAGNOSIS — D62 Acute posthemorrhagic anemia: Secondary | ICD-10-CM

## 2020-12-18 DIAGNOSIS — Z931 Gastrostomy status: Secondary | ICD-10-CM | POA: Diagnosis not present

## 2020-12-18 DIAGNOSIS — M62838 Other muscle spasm: Secondary | ICD-10-CM | POA: Diagnosis not present

## 2020-12-18 DIAGNOSIS — M13162 Monoarthritis, not elsewhere classified, left knee: Secondary | ICD-10-CM | POA: Diagnosis not present

## 2020-12-18 DIAGNOSIS — Z930 Tracheostomy status: Secondary | ICD-10-CM | POA: Diagnosis not present

## 2020-12-18 DIAGNOSIS — U099 Post covid-19 condition, unspecified: Secondary | ICD-10-CM | POA: Diagnosis present

## 2020-12-18 DIAGNOSIS — S83207A Unspecified tear of unspecified meniscus, current injury, left knee, initial encounter: Secondary | ICD-10-CM

## 2020-12-18 DIAGNOSIS — Z79899 Other long term (current) drug therapy: Secondary | ICD-10-CM | POA: Diagnosis not present

## 2020-12-18 DIAGNOSIS — G7281 Critical illness myopathy: Secondary | ICD-10-CM | POA: Diagnosis present

## 2020-12-18 DIAGNOSIS — I129 Hypertensive chronic kidney disease with stage 1 through stage 4 chronic kidney disease, or unspecified chronic kidney disease: Secondary | ICD-10-CM | POA: Diagnosis present

## 2020-12-18 DIAGNOSIS — I9589 Other hypotension: Secondary | ICD-10-CM | POA: Diagnosis not present

## 2020-12-18 DIAGNOSIS — S83512A Sprain of anterior cruciate ligament of left knee, initial encounter: Secondary | ICD-10-CM | POA: Diagnosis not present

## 2020-12-18 DIAGNOSIS — N189 Chronic kidney disease, unspecified: Secondary | ICD-10-CM

## 2020-12-18 DIAGNOSIS — Z87891 Personal history of nicotine dependence: Secondary | ICD-10-CM | POA: Diagnosis not present

## 2020-12-18 DIAGNOSIS — W1830XA Fall on same level, unspecified, initial encounter: Secondary | ICD-10-CM | POA: Diagnosis not present

## 2020-12-18 DIAGNOSIS — Y92238 Other place in hospital as the place of occurrence of the external cause: Secondary | ICD-10-CM | POA: Diagnosis not present

## 2020-12-18 DIAGNOSIS — S83412A Sprain of medial collateral ligament of left knee, initial encounter: Secondary | ICD-10-CM | POA: Diagnosis not present

## 2020-12-18 DIAGNOSIS — Z23 Encounter for immunization: Secondary | ICD-10-CM

## 2020-12-18 DIAGNOSIS — R1310 Dysphagia, unspecified: Secondary | ICD-10-CM | POA: Diagnosis not present

## 2020-12-18 DIAGNOSIS — N183 Chronic kidney disease, stage 3 unspecified: Secondary | ICD-10-CM | POA: Diagnosis present

## 2020-12-18 DIAGNOSIS — G5621 Lesion of ulnar nerve, right upper limb: Secondary | ICD-10-CM

## 2020-12-18 DIAGNOSIS — M792 Neuralgia and neuritis, unspecified: Secondary | ICD-10-CM | POA: Diagnosis not present

## 2020-12-18 DIAGNOSIS — R7989 Other specified abnormal findings of blood chemistry: Secondary | ICD-10-CM | POA: Diagnosis not present

## 2020-12-18 DIAGNOSIS — S83282A Other tear of lateral meniscus, current injury, left knee, initial encounter: Secondary | ICD-10-CM | POA: Diagnosis present

## 2020-12-18 DIAGNOSIS — D631 Anemia in chronic kidney disease: Secondary | ICD-10-CM | POA: Diagnosis present

## 2020-12-18 DIAGNOSIS — N3941 Urge incontinence: Secondary | ICD-10-CM | POA: Diagnosis not present

## 2020-12-18 DIAGNOSIS — Z431 Encounter for attention to gastrostomy: Secondary | ICD-10-CM | POA: Diagnosis not present

## 2020-12-18 DIAGNOSIS — G6281 Critical illness polyneuropathy: Secondary | ICD-10-CM | POA: Diagnosis present

## 2020-12-18 DIAGNOSIS — R5381 Other malaise: Principal | ICD-10-CM | POA: Diagnosis present

## 2020-12-18 DIAGNOSIS — G4700 Insomnia, unspecified: Secondary | ICD-10-CM | POA: Diagnosis not present

## 2020-12-18 DIAGNOSIS — S83232A Complex tear of medial meniscus, current injury, left knee, initial encounter: Secondary | ICD-10-CM | POA: Diagnosis not present

## 2020-12-18 DIAGNOSIS — D638 Anemia in other chronic diseases classified elsewhere: Secondary | ICD-10-CM

## 2020-12-18 DIAGNOSIS — L89896 Pressure-induced deep tissue damage of other site: Secondary | ICD-10-CM | POA: Diagnosis present

## 2020-12-18 DIAGNOSIS — M2342 Loose body in knee, left knee: Secondary | ICD-10-CM | POA: Diagnosis not present

## 2020-12-18 DIAGNOSIS — D6489 Other specified anemias: Secondary | ICD-10-CM | POA: Diagnosis present

## 2020-12-18 DIAGNOSIS — G8929 Other chronic pain: Secondary | ICD-10-CM | POA: Diagnosis present

## 2020-12-18 DIAGNOSIS — M25462 Effusion, left knee: Secondary | ICD-10-CM | POA: Diagnosis present

## 2020-12-18 LAB — GLUCOSE, CAPILLARY: Glucose-Capillary: 93 mg/dL (ref 70–99)

## 2020-12-18 MED ORDER — ACETAMINOPHEN 325 MG PO TABS
325.0000 mg | ORAL_TABLET | ORAL | Status: DC | PRN
  Administered 2021-01-11 – 2021-01-17 (×3): 650 mg via ORAL
  Filled 2020-12-18 (×3): qty 2

## 2020-12-18 MED ORDER — FLEET ENEMA 7-19 GM/118ML RE ENEM: 1.0000 | ENEMA | Freq: Once | RECTAL | Status: DC | PRN

## 2020-12-18 MED ORDER — POLYETHYLENE GLYCOL 3350 17 G PO PACK: 17.0000 g | PACK | Freq: Every day | ORAL | Status: DC | PRN

## 2020-12-18 MED ORDER — ENOXAPARIN SODIUM 40 MG/0.4ML ~~LOC~~ SOLN
40.0000 mg | SUBCUTANEOUS | Status: DC
  Administered 2020-12-19 – 2021-01-08 (×21): 40 mg via SUBCUTANEOUS
  Filled 2020-12-18 (×21): qty 0.4

## 2020-12-18 MED ORDER — CHLORHEXIDINE GLUCONATE 0.12% ORAL RINSE (MEDLINE KIT)
15.0000 mL | Freq: Two times a day (BID) | OROMUCOSAL | Status: DC
  Administered 2020-12-19 – 2020-12-20 (×3): 15 mL via OROMUCOSAL
  Filled 2020-12-18: qty 15

## 2020-12-18 MED ORDER — ALBUTEROL SULFATE (2.5 MG/3ML) 0.083% IN NEBU: 2.5000 mg | INHALATION_SOLUTION | Freq: Four times a day (QID) | RESPIRATORY_TRACT | 12 refills | Status: DC | PRN

## 2020-12-18 MED ORDER — ADULT MULTIVITAMIN W/MINERALS CH
1.0000 | ORAL_TABLET | Freq: Every day | ORAL | Status: DC
  Administered 2020-12-19 – 2021-01-24 (×37): 1 via ORAL
  Filled 2020-12-18 (×37): qty 1

## 2020-12-18 MED ORDER — DIPHENHYDRAMINE HCL 12.5 MG/5ML PO ELIX: 12.5000 mg | ORAL_SOLUTION | Freq: Four times a day (QID) | ORAL | Status: DC | PRN

## 2020-12-18 MED ORDER — ZINC OXIDE 40 % EX OINT
TOPICAL_OINTMENT | CUTANEOUS | Status: DC | PRN
  Filled 2020-12-18: qty 57

## 2020-12-18 MED ORDER — PROCHLORPERAZINE 25 MG RE SUPP: 12.5000 mg | Freq: Four times a day (QID) | RECTAL | Status: DC | PRN

## 2020-12-18 MED ORDER — RESOURCE THICKENUP CLEAR PO POWD: 1.0000 | ORAL | Status: DC | PRN

## 2020-12-18 MED ORDER — ACETAMINOPHEN 325 MG PO TABS: 650.0000 mg | ORAL_TABLET | Freq: Four times a day (QID) | ORAL | 0 refills | Status: DC | PRN

## 2020-12-18 MED ORDER — DOCUSATE SODIUM 50 MG PO CAPS: 50.0000 mg | ORAL_CAPSULE | Freq: Two times a day (BID) | ORAL | 0 refills | Status: DC

## 2020-12-18 MED ORDER — METOPROLOL TARTRATE 25 MG PO TABS: 12.5000 mg | ORAL_TABLET | Freq: Two times a day (BID) | ORAL | Status: DC

## 2020-12-18 MED ORDER — LIP MEDEX EX OINT: 1.0000 "application " | TOPICAL_OINTMENT | CUTANEOUS | 0 refills | Status: DC | PRN

## 2020-12-18 MED ORDER — FAMOTIDINE 20 MG PO TABS
20.0000 mg | ORAL_TABLET | Freq: Two times a day (BID) | ORAL | Status: DC
  Administered 2020-12-18 – 2021-01-24 (×74): 20 mg via ORAL
  Filled 2020-12-18 (×74): qty 1

## 2020-12-18 MED ORDER — QUETIAPINE FUMARATE 25 MG PO TABS
25.0000 mg | ORAL_TABLET | Freq: Every day | ORAL | Status: DC
  Administered 2020-12-18 – 2021-01-02 (×16): 25 mg via ORAL
  Filled 2020-12-18 (×16): qty 1

## 2020-12-18 MED ORDER — BISACODYL 10 MG RE SUPP: 10.0000 mg | Freq: Every day | RECTAL | Status: DC | PRN

## 2020-12-18 MED ORDER — ENSURE ENLIVE PO LIQD
237.0000 mL | Freq: Three times a day (TID) | ORAL | Status: DC
  Administered 2020-12-19: 237 mL via ORAL

## 2020-12-18 MED ORDER — METOPROLOL TARTRATE 12.5 MG HALF TABLET
12.5000 mg | ORAL_TABLET | Freq: Two times a day (BID) | ORAL | Status: DC
  Administered 2020-12-18 – 2021-01-01 (×23): 12.5 mg via ORAL
  Filled 2020-12-18 (×32): qty 1

## 2020-12-18 MED ORDER — ORAL CARE MOUTH RINSE
15.0000 mL | Freq: Two times a day (BID) | OROMUCOSAL | Status: DC
  Administered 2020-12-18 – 2021-01-24 (×27): 15 mL via OROMUCOSAL

## 2020-12-18 MED ORDER — ALUM & MAG HYDROXIDE-SIMETH 200-200-20 MG/5ML PO SUSP: 30.0000 mL | ORAL | Status: DC | PRN

## 2020-12-18 MED ORDER — PROCHLORPERAZINE EDISYLATE 10 MG/2ML IJ SOLN: 5.0000 mg | Freq: Four times a day (QID) | INTRAMUSCULAR | Status: DC | PRN

## 2020-12-18 MED ORDER — ZINC OXIDE 40 % EX OINT: TOPICAL_OINTMENT | CUTANEOUS | 0 refills | Status: DC | PRN

## 2020-12-18 MED ORDER — FAMOTIDINE 20 MG PO TABS: 20.0000 mg | ORAL_TABLET | Freq: Two times a day (BID) | ORAL | 0 refills | Status: DC

## 2020-12-18 MED ORDER — LIP MEDEX EX OINT: 1.0000 "application " | TOPICAL_OINTMENT | CUTANEOUS | Status: DC | PRN

## 2020-12-18 MED ORDER — QUETIAPINE FUMARATE 25 MG PO TABS: 25.0000 mg | ORAL_TABLET | Freq: Every day | ORAL | Status: DC

## 2020-12-18 MED ORDER — GUAIFENESIN-DM 100-10 MG/5ML PO SYRP: 5.0000 mL | ORAL_SOLUTION | Freq: Four times a day (QID) | ORAL | Status: DC | PRN

## 2020-12-18 MED ORDER — ALBUTEROL SULFATE (2.5 MG/3ML) 0.083% IN NEBU: 2.5000 mg | INHALATION_SOLUTION | Freq: Four times a day (QID) | RESPIRATORY_TRACT | Status: DC | PRN

## 2020-12-18 MED ORDER — DOCUSATE SODIUM 50 MG PO CAPS
50.0000 mg | ORAL_CAPSULE | Freq: Two times a day (BID) | ORAL | Status: DC
  Administered 2020-12-18 – 2021-01-07 (×40): 50 mg via ORAL
  Filled 2020-12-18 (×41): qty 1

## 2020-12-18 MED ORDER — CHLORHEXIDINE GLUCONATE 0.12% ORAL RINSE (MEDLINE KIT): 15.0000 mL | Freq: Two times a day (BID) | OROMUCOSAL | 0 refills | Status: DC

## 2020-12-18 MED ORDER — PROCHLORPERAZINE MALEATE 5 MG PO TABS: 5.0000 mg | ORAL_TABLET | Freq: Four times a day (QID) | ORAL | Status: DC | PRN

## 2020-12-18 MED ORDER — TRAZODONE HCL 50 MG PO TABS: 25.0000 mg | ORAL_TABLET | Freq: Every evening | ORAL | Status: DC | PRN

## 2020-12-18 MED ORDER — RESOURCE THICKENUP CLEAR PO POWD: ORAL | Status: DC | PRN

## 2020-12-18 MED ORDER — CHLORHEXIDINE GLUCONATE CLOTH 2 % EX PADS: 6.0000 | MEDICATED_PAD | Freq: Every day | CUTANEOUS | Status: DC

## 2020-12-18 MED ORDER — ENSURE ENLIVE PO LIQD: 237.0000 mL | Freq: Three times a day (TID) | ORAL | 12 refills | Status: DC

## 2020-12-18 NOTE — Progress Notes (Signed)
Jamse Arn, MD  Physician  Physical Medicine and Rehabilitation  PMR Pre-admission     Signed  Date of Service:  12/18/2020 12:52 PM           Signed          Show:Clear all $RemoveBefore'[x]'VOIyWGdFBvyeb$ Manual$R'[x]'aA$ Templa'[x]'$ Copied  Added by: $RemoveB'[x]'QUTZpzGB$ Jackson Gong, RN$RemoveBeforeDE'[x]'sijXRIjHIFWsyLC$ Jamse Arn, MD   '[]'$ Hover for details  PMR Admission Coordinator Pre-Admission Assessment   Patient: Jackson Figueroa is an 60 y.o., male MRN: [id number] DOB: [birth year] Height: 6' (182.9 cm) Weight: 110.9 kg   Insurance Information HMO:     PPO: yes     PCP:      IPA:      80/20:      OTHER:  PRIMARY: [employer]      Policy#: [id number]      Subscriber: pt CM Name: Raquel Sarna      Phone#: [phone]     Fax#: [phone] Pre-Cert#: [id number] approved for 7 days      Employer:  Benefits:  Phone #: [phone]     Name: 1/15 Eff. Date: 04/23/2020    Deduct: $1500      Out of Pocket Max: $3000      Life Max: none CIR: 80%      SNF: 80% 120 day limit Outpatient: 80%     Co-Pay: visits per medical neccesity Home Health: 80%      Co-Pay: 120 visit limit DME: 80%     Co-Pay: 20% Providers: in network  SECONDARY: none   Financial Counselor:       Phone#:    The Engineer, petroleum" for patients in Inpatient Rehabilitation Facilities with attached "Privacy Act Surrency Records" was provided and verbally reviewed with: N/A   Emergency Contact Information         Contact Information     Name Relation Home Work Mobile    Jackson Figueroa, Jackson Figueroa Sister     [phone]    Jackson Figueroa, Jackson Figueroa Significant other     [phone]         Current Medical History  Patient Admitting Diagnosis: post COVID debility; critical illness neuropathy   History of Present Illness: 60 year old man with unremarkable past medical history presented from [facility] with COVID 19 pneumonia on 10/20/2020. Severe hypoxemic respiratory failure with ARDS. Initially tested positive on 10/18/20. Subsequently intubated and transferred to  [facility]. Was unable to wean from the ventilator and trach was place on 11/14/20. He has since been liberated from the trach and was tolerating PMSV on trach. PEG placed 11/21/20 and began on dysphagia diet.    From 12/16 until 12/26 underwent CRRT due to severe AKI due to ATN from septic shock and COVID. Transitioned to intermittent hemodialysis and last dialysis was 12/28. Dialysis catheter now removed. 1/14 trach downsized to #6 cuff less, then capped with eventual decannulation on 12/25 and on room air.  Now tolerating Dysphagia 3 diet .    Patient's medical record from [facility]  has been reviewed by the rehabilitation admission coordinator and physician.   Past Medical History      Past Medical History:  Diagnosis Date  . Obesity        Family History   family history is not on file.   Prior Rehab/Hospitalizations Has the patient had prior rehab or hospitalizations prior to admission? Yes   Has the patient had major surgery during 100 days prior to admission? Yes  Current Medications   Current Facility-Administered Medications:  .  0.9 %  sodium chloride infusion, , Intravenous, PRN, Lorin Glass, MD, Last Rate: 10 mL/hr at 12/05/20 0600, Infusion Verify at 12/05/20 0600 .  acetaminophen (TYLENOL) tablet 650 mg, 650 mg, Oral, Q6H PRN, Dorcas Carrow, MD, 650 mg at 12/18/20 0956 .  albuterol (PROVENTIL) (2.5 MG/3ML) 0.083% nebulizer solution 2.5 mg, 2.5 mg, Nebulization, Q6H PRN, Zannie Cove, MD, 2.5 mg at 12/06/20 1617 .  chlorhexidine gluconate (MEDLINE KIT) (PERIDEX) 0.12 % solution 15 mL, 15 mL, Mouth Rinse, BID, Nyoka Cowden, MD, 15 mL at 12/18/20 0951 .  Chlorhexidine Gluconate Cloth 2 % PADS 6 each, 6 each, Topical, Q0600, Christia Reading, MD, 6 each at 12/18/20 [phone]  docusate sodium (COLACE) capsule 50 mg, 50 mg, Oral, BID, Dorcas Carrow, MD, 50 mg at 12/18/20 0951 .  famotidine (PEPCID) tablet 20 mg, 20 mg, Oral, BID, Dorcas Carrow, MD, 20 mg at 12/18/20 0951 .  feeding supplement (ENSURE ENLIVE / ENSURE PLUS) liquid 237 mL, 237 mL, Oral, TID BM, Ghimire, Kuber, MD, 237 mL at 12/18/20 0952 .  heparin injection 5,000 Units, 5,000 Units, Subcutaneous, Q8H, Mosetta Anis, RPH, 5,000 Units at 12/18/20 0520 .  iohexol (OMNIPAQUE) 300 MG/ML solution 50 mL, 50 mL, Per Tube, Once PRN, Lorna Few, MD .  lip balm (CARMEX) ointment 1 application, 1 application, Topical, PRN, Christia Reading, MD, 1 application at 11/19/20 1539 .  liver oil-zinc oxide (DESITIN) 40 % ointment, , Topical, PRN, Wynona Neat, Adewale A, MD, Given at 12/08/20 2148 .  MEDLINE mouth rinse, 15 mL, Mouth Rinse, BID, Dorcas Carrow, MD, 15 mL at 12/18/20 0952 .  metoprolol tartrate (LOPRESSOR) tablet 12.5 mg, 12.5 mg, Oral, BID, Dorcas Carrow, MD, 12.5 mg at 12/18/20 0951 .  multivitamin with minerals tablet 1 tablet, 1 tablet, Oral, Daily, Dorcas Carrow, MD, 1 tablet at 12/18/20 0952 .  QUEtiapine (SEROQUEL) tablet 25 mg, 25 mg, Oral, QHS, Ghimire, Kuber, MD, 25 mg at 12/17/20 2133 .  [employer], , Oral, PRN, Melody Comas B, MD .  sodium chloride flush (NS) 0.9 % injection 10-40 mL, 10-40 mL, Intracatheter, Q12H, Christia Reading, MD, 10 mL at 12/18/20 0952 .  sodium chloride flush (NS) 0.9 % injection 10-40 mL, 10-40 mL, Intracatheter, PRN, Christia Reading, MD, 20 mL at 11/15/20 0544   Patients Current Diet:     Diet Order                      Diet - low sodium heart healthy              DIET DYS 3 Room service appropriate? Yes with Assist; Fluid consistency: Thin  Diet effective now                      Precautions / Restrictions Precautions Precautions: Fall Precaution Comments: PEG capped, trach capped, sacral wounds Restrictions Weight Bearing Restrictions: No    Has the patient had 2 or more falls or a fall with injury in the past year? No   Prior Activity Level Community (5-7x/wk): Independent, works as an Airline pilot,  driving   Prior Functional Level Self Care: Did the patient need help bathing, dressing, using the toilet or eating? Independent   Indoor Mobility: Did the patient need assistance with walking from room to room (with or without device)? Independent   Stairs: Did the patient need assistance with internal or external stairs (with  or without device)? Independent   Functional Cognition: Did the patient need help planning regular tasks such as shopping or remembering to take medications? Independent   Home Assistive Devices / Equipment Home Assistive Devices/Equipment: None Home Equipment: Shower seat - built in   Prior Device Use: Indicate devices/aids used by the patient prior to current illness, exacerbation or injury? None of the above   Current Functional Level Cognition   Overall Cognitive Status: Within Functional Limits for tasks assessed Difficult to assess due to: Tracheostomy Current Attention Level: Selective Orientation Level: Oriented X4 Following Commands: Follows one step commands with increased time Safety/Judgement: Decreased awareness of deficits,Decreased awareness of safety General Comments: A&Ox4, very pleasant and motivated to improve strength.    Extremity Assessment (includes Sensation/Coordination)   Upper Extremity Assessment: Generalized weakness,RUE deficits/detail,LUE deficits/detail RUE Deficits / Details: advances whole arm utilizing momentum 2/2 weakness. weak grasp. RUE Sensation: decreased light touch RUE Coordination: decreased fine motor,decreased gross motor LUE Deficits / Details: weakness noted throughout LUE. Decreased gross and fine motor coordination. Reports ulnar aspect of hand/forearm numb. LUE Coordination: decreased fine motor,decreased gross motor  Lower Extremity Assessment: Defer to PT evaluation RLE Deficits / Details: no toe wiggle, trace ankle movement, 2-/5 hip abduct/ADD, trace hamstring activation, no active hip/knee flexion LLE  Deficits / Details: toe wiggle 2/5, trace ankle movement, 2-/5 hip abduct/ADD, trace hamstring activation, no active hip/knee flexion     ADLs   Overall ADL's : Needs assistance/impaired Eating/Feeding: Set up,Bed level Eating/Feeding Details (indicate cue type and reason): pt reports difficulty holding to utensils, using L rather than R UE (R handed) and attempting to coordinate R UE use into tasks. Provided red foam grips to trial for improved efficiency of self feeding. NT aware Grooming: Moderate assistance,Bed level,Oral care Grooming Details (indicate cue type and reason): Mod A overall for oral care routine with mouthwash swab and suction. able to bend elbow but due to shoulder weakness, unable to bring to mouth without HOH assist. Pt fatigued quickly with task. max A to wash face at end Lower Body Bathing: Total assistance,Bed level Lower Body Bathing Details (indicate cue type and reason): simulated via posterior pericare from bed level Upper Body Dressing Details (indicate cue type and reason): simulated with pt reaching to remove electrodes from torso, improving use of B UE Toilet Transfer Details (indicate cue type and reason): unable to to transfer however pt requried MAX A to sit EOB Toileting- Clothing Manipulation and Hygiene: Total assistance,Bed level Toileting - Clothing Manipulation Details (indicate cue type and reason): Total A for bed pan use and clean up Functional mobility during ADLs: Maximal assistance,+2 for physical assistance (bed mobility only; supine<>EOB) General ADL Comments: Session focused on transfers and sitting EOB tolerance. Trialed sara stedy but ultimately sara plus was a better match to facilitate pt coming into standing position.     Mobility   Overal bed mobility: Needs Assistance Bed Mobility: Supine to Sit Rolling: Min guard Sidelying to sit: Mod assist Supine to sit: Max assist Sit to supine: Mod assist Sit to sidelying: Min assist General bed  mobility comments: Assist mostly to powerup trunk and a little but to advance BLE completey off bed.     Transfers   Overall transfer level: Needs assistance Equipment used: Ambulation equipment used Transfer via Lift Equipment: Stedy (Stedy Plus) Transfers: Sit to/from Stand Sit to Stand: Mod assist,+2 safety/equipment,+2 physical assistance Stand pivot transfers: Max assist,+2 physical assistance  Lateral/Scoot Transfers: Min assist General transfer comment: With use  of sara plus pt able to come to standing position and hold for about 30 seconds.     Ambulation / Gait / Stairs / Wheelchair Mobility   Ambulation/Gait General Gait Details: not yet ready     Posture / Balance Dynamic Sitting Balance Sitting balance - Comments: able to support self sitting EOB for several minutes Balance Overall balance assessment: Needs assistance Sitting-balance support: Bilateral upper extremity supported,Feet supported Sitting balance-Leahy Scale: Fair Sitting balance - Comments: able to support self sitting EOB for several minutes Standing balance support: Bilateral upper extremity supported Standing balance-Leahy Scale: Poor Standing balance comment: in sara plus     Special needs/care consideration Decannulation trach 1/25 Designated visitor is girlfriend, Jackson Figueroa      20 Fr G tube Contact precautions       Salley Slaughter, RN  Registered Nurse  WOC  Consult Note      Addendum   Date of Service:  12/12/2020 10:01 AM                      Show:Clear all $RemoveBefore'[x]'TdHHEpUgHQQNt$ ?Manual$R'[]'Xg$ ?Templa'[x]'$ ?Copied   Added by: $RemoveB'[x]'GgIFulBu$ ?Salley Slaughter, RN     '[]'$ ?Hover for details   Barber Nurse wound follow up Patient receiving care in [facility] 2W23 Wound type:sacral DTI with MASD Wound bed: clean and pink over the buttocks;  3 areas on the buttocks appears to be from friction and shear with no drainage. Much improvement noted today. Toes are black dry and stable.  Periwound: intact white from zinc based moisture  barrier cream. Dressing procedure/placement/frequency: Continue zinc based moisture barrier cream (Desitin) to the buttocks; BID and PRN after each episode of incontinence. Remind patient to turn side to side to prevent worsening of wounds.  All wounds are stable. No need for WOC to continue to follow. Signing off at this time.   Monitor the wound area(s) for worsening of condition such as: Signs/symptoms of infection, increase in size, development of or worsening of odor, development of pain, or increased pain at the affected locations.   Notify the medical team if any of these develop.   Thank you for the consult. Please re-consult the [place] team if needed.   Cathlean Marseilles Tamala Julian, MSN, RN, CMSRN, AGCNS, [facility] Associate Pager [phone]      Previous Home Environment  Living Arrangements:  (girlfriend, Jackson Figueroa)  Lives With: Significant other Available Help at Discharge: Family,Available 24 hours/day Type of Home: House Home Layout: Multi-level Alternate Level Stairs-Number of Steps: Flight Home Access:  (2) Bathroom Shower/Tub: Multimedia programmer: Standard Bathroom Accessibility: Yes How Accessible: Accessible via walker [place]: No Additional Comments: Information provided by girlfriend   Discharge Living Setting Plans for Discharge Living Setting: Patient's home,Lives with (comment) (girlfriend, Jackson Figueroa) Type of Home at Discharge: House Discharge Home Layout: Multi-level,Able to live on main level with bedroom/bathroom Alternate Level Stairs-Number of Steps: flight Discharge Home Access: Stairs to enter Entrance Stairs-Rails: None Entrance Stairs-Number of Steps: 2 Discharge Bathroom Shower/Tub: Horticulturist, commercial: Standard Discharge Bathroom Accessibility: Yes How Accessible: Accessible via walker Does the patient have any problems obtaining your medications?: No   Social/Family/Support Systems Contact Information:  girlfriend, Economist Anticipated Caregiver: Jackson Figueroa Anticipated Caregiver's Contact Information: see above Ability/Limitations of Caregiver: she has back issues so can not lift Caregiver Availability: 24/7 Discharge Plan Discussed with Primary Caregiver: Yes Is Caregiver In Agreement with Plan?: Yes Does Caregiver/Family have Issues with Lodging/Transportation while Pt is in Rehab?: No  Goals Patient/Family Goal for Rehab: supervision to min assist with PT and OT Expected length of stay: ELOS 2 to 3 weeks Pt/Family Agrees to Admission and willing to participate: Yes Program Orientation Provided & Reviewed with Pt/Caregiver Including Roles  & Responsibilities: Yes   Decrease burden of Care through IP rehab admission: n/a   Possible need for SNF placement upon discharge: not anticipated   Patient Condition: I have reviewed medical records from [facility] , spoken with  patient and family member. I met with patient at the bedside for inpatient rehabilitation assessment.  Patient will benefit from ongoing PT and OT, can actively participate in 3 hours of therapy a day 5 days of the week, and can make measurable gains during the admission.  Patient will also benefit from the coordinated team approach during an Inpatient Acute Rehabilitation admission.  The patient will receive intensive therapy as well as Rehabilitation physician, nursing, social worker, and care management interventions.  Due to bladder management, bowel management, safety, skin/wound care, disease management, medication administration, pain management and patient education the patient requires 24 hour a day rehabilitation nursing.  The patient is currently mod to max assist with mobility and basic ADLs.  Discharge setting and therapy post discharge at home with home health is anticipated.  Patient has agreed to participate in the Acute Inpatient Rehabilitation Program and will admit today.   Preadmission Screen Completed By:   Cleatrice Burke, 12/18/2020 12:52 PM ______________________________________________________________________   Discussed status with Dr. Posey Pronto  on  12/18/2020 at  1330 and received approval for admission today.   Admission Coordinator:  Cleatrice Burke, RN, time  2094 Date  12/18/2020    Assessment/Plan: Diagnosis: CIP/CIM 1. Does the need for close, 24 hr/day Medical supervision in concert with the patient's rehab needs make it unreasonable for this patient to be served in a less intensive setting? Yes 2. Co-Morbidities requiring supervision/potential complications: RUE sports injuries, ABLA (repeat labs, consider transfusion if necessary to ensure appropriate perfusion for increased activity tolerance), slow transit consitpation 3. Due to safety, disease management and patient education, does the patient require 24 hr/day rehab nursing? Yes 4. Does the patient require coordinated care of a physician, rehab nurse, PT, OT, and SLP to address physical and functional deficits in the context of the above medical diagnosis(es)? Yes Addressing deficits in the following areas: balance, endurance, locomotion, strength, transferring, bathing, dressing, toileting and psychosocial support 5. Can the patient actively participate in an intensive therapy program of at least 3 hrs of therapy 5 days a week? Yes 6. The potential for patient to make measurable gains while on inpatient rehab is excellent 7. Anticipated functional outcomes upon discharge from inpatient rehab: supervision and min assist PT, supervision and min assist OT, n/a SLP 8. Estimated rehab length of stay to reach the above functional goals is: 20-24 days. 9. Anticipated discharge destination: Home 10. Overall Rehab/Functional Prognosis: excellent     MD Signature: Delice Lesch, MD, ABPMR           Revision History                        Note Details  Author Jamse Arn, MD File Time 12/18/2020  1:39 PM   Author Type Physician Status Signed  Last Editor Jamse Arn, MD Service Physical Medicine and [place] # [id number] Admit Date 10/20/2020

## 2020-12-18 NOTE — Progress Notes (Signed)
Report was given to Pearline Cables at 4th floor inpatient rehab.

## 2020-12-18 NOTE — H&P (Signed)
Physical Medicine and Rehabilitation Admission H&P    Chief Complaint  Patient presents with  . Critical illness neuropathy    HPI: Jackson Figueroa is a 60 year old male with h/o morbid obesity who was originally admitted with Covid PNA with septic shock on 10/12/2020 and treated with Covid protocol.  History from chart review and patient--has not been vaccinated against Covid.   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  He required tracheostomy on 11/14/2020 by Dr. Redmond Baseman and PEG placed on 11/21/2020 by Dr. Laurence Ferrari. Palliative care consulted to discuss goals of care and family elected on full scope of care.   He tolerated extubation to ATC, was weaned to room air on 12/09/2020.  He tolerated plugging and was decannulated on 12/17/2020.  Swallow function has improved and he has been advanced to dysphagia #2, thin liquids with use of aspiration precautions. 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 Therapy ongoing and working on endurance with sitting in a chair for an hour as well as trials in [facility] to attempt weight bearing on BLE for 90 seconds. CIR recommended due to functional decline and severe deconditioning.  Please see preadmission assessment from earlier today as well.   Review of Systems  Constitutional: Positive for malaise/fatigue. Negative for chills and fever.  HENT: Negative for hearing loss and tinnitus.   Eyes: Positive for blurred vision (since admission). Negative for double vision.  Respiratory: Negative for cough, shortness of breath and wheezing.    Cardiovascular: Negative for chest pain and palpitations.  Gastrointestinal: Negative for constipation, heartburn and nausea.  Genitourinary: Negative for dysuria, hematuria and urgency.  Musculoskeletal: Positive for back pain, joint pain and myalgias.  Skin: Negative for itching and rash.  Neurological: Positive for sensory change and weakness.  Psychiatric/Behavioral: Negative for memory loss.  All other systems reviewed and are negative.     Past Medical History:  Diagnosis Date  . Allergic   . Knee pain, bilateral    due to old injuires  . Numbness and tingling in right hand    and elbow due to sports injury  . Obesity   . Staph infection    of appy wound--treated with antibiotics X 4 weeks.   . Tendinitis of elbow    right    Past Surgical History:  Procedure Laterality Date  . APPENDECTOMY  1982  . INGUINAL HERNIA REPAIR Left 2000  . IR GASTROSTOMY TUBE MOD SED  11/21/2020  . KNEE ARTHROSCOPY     bilateral knees  . TONSILLECTOMY AND ADENOIDECTOMY    . TRACHEOSTOMY TUBE PLACEMENT N/A 11/14/2020   Procedure: TRACHEOSTOMY;  Surgeon: Melida Quitter, MD;  Location: [facility];  Service: ENT;  Laterality: N/A;    Family History  Problem Relation Age of Onset  . Cerebral aneurysm Mother 70  . Heart disease Father   . 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 His significant other is disabled due to back issues--moved to [place] couple of years ago. He used to smoke 1-2 PPD --quit in 2013 after walking PNA. He has never used smokeless tobacco.  He reports current alcohol use--couple of glasses of wine and occasional liquor.  He reports that he does not use drugs.    Allergies: No Known Allergies    Medications Prior to Admission  Medication Sig Dispense Refill  . Multiple Vitamin (MULTIVITAMIN WITH MINERALS) TABS tablet Take 1 tablet by mouth daily.      Drug Regimen Review  Drug regimen  was reviewed and remains appropriate with no significant issues identified  Home: Home Living Family/patient expects to be discharged to:: Private residence Living Arrangements:  (girlfriend, Economist) Available Help at Discharge: Family,Available 24 hours/day Type of Home: House Home Access:  (2) Home Layout: Multi-level Alternate Level Stairs-Number of Steps: Flight Bathroom Shower/Tub: Multimedia programmer: Programmer, systems: Yes Home Equipment: Civil engineer, contracting - built in Additional Comments: Information provided by girlfriend  Lives With: Significant other   Functional History: Prior Function Level of Independence: Independent Comments: 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 Transfers Overall transfer level: Needs assistance Equipment used: Ambulation equipment used Transfer via Lift Equipment: Stedy (Stedy Plus) Transfers: Sit to/from Stand Sit to Stand: Mod assist,+2 safety/equipment,+2 physical assistance Stand pivot transfers: Max assist,+2 physical assistance  Lateral/Scoot Transfers: Min assist General transfer comment: Tried sara plus for back to bed but pt too fatigued. He was able to sit up for an hour. Hoyer lift ultimately for back to bed Ambulation/Gait General Gait Details: not yet ready    ADL: ADL Overall ADL's : Needs assistance/impaired Eating/Feeding: Set up,Bed level Eating/Feeding Details (indicate cue type and reason): pt reports difficulty holding to utensils, using L rather than R UE (R handed) and attempting to coordinate R UE use into tasks. Provided red foam grips to trial for  improved efficiency of self feeding. NT aware Grooming: Moderate assistance,Bed level,Oral care Grooming Details (indicate cue type and reason): Mod A overall for oral care routine with mouthwash swab and suction. able to bend elbow but due to shoulder weakness, unable to bring to mouth without HOH assist. Pt fatigued quickly with task. max A to wash face at end Lower Body Bathing: Total assistance,Bed level Lower Body Bathing Details (indicate cue type and reason): simulated via posterior pericare from bed level Upper Body Dressing Details (indicate cue type and reason): simulated with pt reaching to remove electrodes from torso, improving use of B UE Toilet Transfer Details (indicate cue type and reason): unable to to transfer however pt requried MAX A to sit EOB Toileting- Clothing Manipulation and Hygiene: Total assistance,Bed level Toileting - Clothing Manipulation Details (indicate cue type and reason): Total A for bed pan use and clean up Functional mobility during ADLs: Maximal assistance,+2 for physical assistance (bed mobility only; supine<>EOB) General ADL Comments: Sat up in recliner for one hour then ready for back to bed. Utilized Hoyer lift due to increased fatigue on generalized weakness  Cognition: Cognition Overall Cognitive Status: Within Functional Limits for tasks assessed Orientation Level: Oriented X4 Cognition Arousal/Alertness: Awake/alert Behavior During Therapy: WFL for tasks assessed/performed Overall Cognitive Status: Within Functional Limits for tasks assessed Area of Impairment: Safety/judgement Orientation Level: Disoriented to,Time Current Attention Level: Selective Memory: Decreased short-term memory Following Commands: Follows one step  commands with increased time Safety/Judgement: Decreased awareness of deficits,Decreased awareness of safety Problem Solving: Requires verbal cues General Comments: A&Ox4, very pleasant and motivated to improve  strength. Difficult to assess due to: Tracheostomy  Physical Exam: Blood pressure 103/77, pulse 86, temperature 98.3 F (36.8 C), temperature source Oral, resp. rate 18, height 6' (1.829 m), weight 110.9 kg, SpO2 100 %. Physical Exam Vitals reviewed.  Constitutional:      General: He is not in acute distress.    Appearance: He is obese. He is not ill-appearing.  HENT:     Head: Normocephalic and atraumatic.     Right Ear: External ear normal.     Left Ear: External ear normal.     Nose: Nose normal.  Eyes:     General:        Right eye: No discharge.        Left eye: No discharge.     Extraocular Movements: Extraocular movements intact.  Cardiovascular:     Rate and Rhythm: Normal rate and regular rhythm.  Pulmonary:     Effort: Pulmonary effort is normal. No respiratory distress.     Breath sounds: No stridor.  Abdominal:     General: Abdomen is flat. Bowel sounds are normal. There is no distension.  Musculoskeletal:     Cervical back: Normal range of motion and neck supple.     Comments: No edema or tenderness in extremities  Skin:    General: Skin is warm and dry.     Comments: Sacrum discolored with evidence of dried MASD with few small abraded areas-- extends to perineum--see picture below. Right great toe and right 2nd toe with dried blisters. Bilateral plantar surface with dry flaky skin.   Neurological:     Mental Status: He is alert.     Comments: Alert and oriented Motor: Right upper extremity: Shoulder abduction 3+/5, distally 3/5 Left upper extremity: 4 -/5 proximal distal Bilateral lower extremities: Hip flexion, knee extension 2+/5, ankle dorsiflexion 4/5 Sensation diminished to light touch distal to right elbow  Psychiatric:        Mood and Affect: Mood normal.        Behavior: Behavior normal.          Results for orders placed or performed during the hospital encounter of 10/20/20 (from the past 48 hour(s))  Glucose, capillary     Status: None    Collection Time: 12/16/20  7:11 PM  Result Value Ref Range   Glucose-Capillary 76 70 - 99 mg/dL    Comment: Glucose reference range applies only to samples taken after fasting for at least 8 hours.  Glucose, capillary     Status: Abnormal   Collection Time: 12/16/20 10:58 PM  Result Value Ref Range   Glucose-Capillary 100 (H) 70 - 99 mg/dL    Comment: Glucose reference range applies only to samples taken after fasting for at least 8 hours.  Glucose, capillary     Status: None   Collection Time: 12/17/20  3:15 AM  Result Value Ref Range   Glucose-Capillary 88 70 - 99 mg/dL    Comment: Glucose reference range applies only to samples taken after fasting for at least 8 hours.  Glucose, capillary     Status: None   Collection Time: 12/17/20  7:23 AM  Result Value Ref Range   Glucose-Capillary 99 70 - 99 mg/dL    Comment: Glucose reference range applies only to samples taken after fasting for at least 8 hours.  Glucose,  capillary     Status: Abnormal   Collection Time: 12/17/20 11:44 AM  Result Value Ref Range   Glucose-Capillary 106 (H) 70 - 99 mg/dL    Comment: Glucose reference range applies only to samples taken after fasting for at least 8 hours.  Glucose, capillary     Status: None   Collection Time: 12/17/20  4:06 PM  Result Value Ref Range   Glucose-Capillary 94 70 - 99 mg/dL    Comment: Glucose reference range applies only to samples taken after fasting for at least 8 hours.  Glucose, capillary     Status: Abnormal   Collection Time: 12/17/20  7:46 PM  Result Value Ref Range   Glucose-Capillary 109 (H) 70 - 99 mg/dL    Comment: Glucose reference range applies only to samples taken after fasting for at least 8 hours.  Glucose, capillary     Status: Abnormal   Collection Time: 12/17/20 11:54 PM  Result Value Ref Range   Glucose-Capillary 101 (H) 70 - 99 mg/dL    Comment: Glucose reference range applies only to samples taken after fasting for at least 8 hours.  Glucose,  capillary     Status: None   Collection Time: 12/18/20  7:52 AM  Result Value Ref Range   Glucose-Capillary 93 70 - 99 mg/dL    Comment: Glucose reference range applies only to samples taken after fasting for at least 8 hours.   DG Chest Port 1 View  Result Date: 12/17/2020 CLINICAL DATA:  Acute respiratory failure.  History of COVID. EXAM: PORTABLE CHEST 1 VIEW COMPARISON:  11/27/2020. FINDINGS: Tracheostomy tube in stable position. Heart size normal. Persistent but improved bilateral interstitial prominence. No pleural effusion or pneumothorax. Degenerative change thoracic spine. IMPRESSION: 1. Tracheostomy tube in stable position. 2. Persistent but improved bilateral interstitial prominence. Findings suggest improving interstitial edema and or pneumonitis. Electronically Signed   By: Marcello Moores  Register   On: 12/17/2020 06:34       Medical Problem List and Plan: 1.  Diffuse weakness with decreased endurance, balance deficits and pain affecting mobility and ADLs secondary to CIP/CIM.  -patient may shower  -ELOS/Goals: 27-30 days/supervision/min a  Admit to CIR 2.  Antithrombotics: -DVT/anticoagulation:  Pharmaceutical: Lovenox  -antiplatelet therapy: N/A 3. Pain Management: tylenol prn 4. Mood: LCSW to follow for evaluation and support.   -antipsychotic agents: Seroquel 25 5. Neuropsych: This patient is capable of making decisions on his own behalf. 6. Skin/Wound Care:    DTI with MASD: areas on buttocks improving.  Zinc for moisture barrier as well as pressure relief measures.   7. Fluids/Electrolytes/Nutrition: Monitor I/O. Encourage fluid intake. Continue to monitor lytes with serial checks.  8. Morbid obesity: Educate on importance of diet and weight loss to help promote overall health. Encourage side lying when in bed.  9. Pre-renal azotemia: Improving off tube feeds--encourage fluid intake.   CMP ordered for tomorrow. 10. Anemia of critical illness: Will monitor H/H with serial  checks.   CBC ordered for tomorrow. 11. Encephalopathy: Has resolved. Continue Seroquel to help with sleep hygeine.  12.  Dysphagia  D2 thins, advance diet as tolerated  Bary Leriche, PA-C 12/18/2020  I have personally performed a face to face diagnostic evaluation, including, but not limited to relevant history and physical exam findings, of this patient and developed relevant assessment and plan.  Additionally, I have reviewed and concur with the physician assistant's documentation above.  Delice Lesch, MD, ABPMR

## 2020-12-18 NOTE — Progress Notes (Signed)
Jamse Arn, MD  Physician  Physical Medicine and Rehabilitation  PMR Pre-admission     Signed  Date of Service:  12/18/2020 12:52 PM      Related encounter: ED to Hosp-Admission (Current) from 10/20/2020 in [place] [facility]       Signed          Show:Clear all $RemoveBefore'[x]'HvOjeZtFccShi$ Manual$R'[x]'hV$ Templa'[x]'$ Copied  Added by: $RemoveB'[x]'hjhwMbCN$ Cristina Gong, RN$RemoveBeforeDE'[x]'UKNLnyTcYocjbHs$ Jamse Arn, MD   '[]'$ Hover for details  PMR Admission Coordinator Pre-Admission Assessment   Patient: Jackson Figueroa is an 60 y.o., male MRN: [id number] DOB: 1961-02-09 Height: 6' (182.9 cm) Weight: 110.9 kg   Insurance Information HMO:     PPO: yes     PCP:      IPA:      80/20:      OTHER:  PRIMARY: [employer]      Policy#: [id number]      Subscriber: pt CM Name: Raquel Sarna      Phone#: [phone]     Fax#: [phone] Pre-Cert#: [id number] approved for 7 days      Employer:  Benefits:  Phone #: [phone]     Name: 1/15 Eff. Date: 04/23/2020    Deduct: $1500      Out of Pocket Max: $3000      Life Max: none CIR: 80%      SNF: 80% 120 day limit Outpatient: 80%     Co-Pay: visits per medical neccesity Home Health: 80%      Co-Pay: 120 visit limit DME: 80%     Co-Pay: 20% Providers: in network  SECONDARY: none   Financial Counselor:       Phone#:    The Engineer, petroleum" for patients in Inpatient Rehabilitation Facilities with attached "Privacy Act [place] Records" was provided and verbally reviewed with: N/A   Emergency Contact Information         Contact Information     Name Relation Home Work Mobile    Trystin, Terhune Sister     [phone]    decker, barb Significant other     [phone]         Current Medical History  Patient Admitting Diagnosis: post COVID debility; critical illness neuropathy   History of Present Illness: 60 year old man with unremarkable past medical history presented from [facility] with COVID 19 pneumonia on 10/20/2020. Severe hypoxemic  respiratory failure with ARDS. Initially tested positive on 10/18/20. Subsequently intubated and transferred to [facility]. Was unable to wean from the ventilator and trach was place on 11/14/20. He has since been liberated from the trach and was tolerating PMSV on trach. PEG placed 11/21/20 and began on dysphagia diet.    From 12/16 until 12/26 underwent CRRT due to severe AKI due to ATN from septic shock and COVID. Transitioned to intermittent hemodialysis and last dialysis was 12/28. Dialysis catheter now removed. 1/14 trach downsized to #6 cuff less, then capped with eventual decannulation on 12/25 and on room air.  Now tolerating Dysphagia 3 diet .    Patient's medical record from [facility]  has been reviewed by the rehabilitation admission coordinator and physician.   Past Medical History      Past Medical History:  Diagnosis Date  . Obesity        Family History   family history is not on file.   Prior Rehab/Hospitalizations Has the patient had prior rehab or hospitalizations prior to admission? Yes   Has the patient had major  surgery during 100 days prior to admission? Yes              Current Medications   Current Facility-Administered Medications:  .  0.9 %  sodium chloride infusion, , Intravenous, PRN, Candee Furbish, MD, Last Rate: 10 mL/hr at 12/05/20 0600, Infusion Verify at 12/05/20 0600 .  acetaminophen (TYLENOL) tablet 650 mg, 650 mg, Oral, Q6H PRN, [name] Merino, MD, 650 mg at 12/18/20 0956 .  albuterol (PROVENTIL) (2.5 MG/3ML) 0.083% nebulizer solution 2.5 mg, 2.5 mg, Nebulization, Q6H PRN, Domenic Polite, MD, 2.5 mg at 12/06/20 1617 .  chlorhexidine gluconate (MEDLINE KIT) (PERIDEX) 0.12 % solution 15 mL, 15 mL, Mouth Rinse, BID, Tanda Rockers, MD, 15 mL at 12/18/20 0951 .  Chlorhexidine Gluconate Cloth 2 % PADS 6 each, 6 each, Topical, Q0600, Melida Quitter, MD, 6 each at 12/18/20 [phone] .  docusate sodium (COLACE) capsule 50 mg, 50 mg, Oral,  BID, [name] Merino, MD, 50 mg at 12/18/20 0951 .  famotidine (PEPCID) tablet 20 mg, 20 mg, Oral, BID, [name] Merino, MD, 20 mg at 12/18/20 0951 .  feeding supplement (ENSURE ENLIVE / ENSURE PLUS) liquid 237 mL, 237 mL, Oral, TID BM, Ghimire, Kuber, MD, 237 mL at 12/18/20 0952 .  heparin injection 5,000 Units, 5,000 Units, Subcutaneous, Q8H, Einar Grad, RPH, 5,000 Units at 12/18/20 0520 .  iohexol (OMNIPAQUE) 300 MG/ML solution 50 mL, 50 mL, Per Tube, Once PRN, Gus Height, MD .  lip balm (CARMEX) ointment 1 application, 1 application, Topical, PRN, Melida Quitter, MD, 1 application at 37/16/96 1539 .  liver oil-zinc oxide (DESITIN) 40 % ointment, , Topical, PRN, Ander Slade, Adewale A, MD, Given at 12/08/20 2148 .  MEDLINE mouth rinse, 15 mL, Mouth Rinse, BID, [name] Merino, MD, 15 mL at 12/18/20 0952 .  metoprolol tartrate (LOPRESSOR) tablet 12.5 mg, 12.5 mg, Oral, BID, [name] Merino, MD, 12.5 mg at 12/18/20 0951 .  multivitamin with minerals tablet 1 tablet, 1 tablet, Oral, Daily, [name] Merino, MD, 1 tablet at 12/18/20 0952 .  QUEtiapine (SEROQUEL) tablet 25 mg, 25 mg, Oral, QHS, Ghimire, Kuber, MD, 25 mg at 12/17/20 2133 .  [employer], , Oral, PRN, Freda Jackson B, MD .  sodium chloride flush (NS) 0.9 % injection 10-40 mL, 10-40 mL, Intracatheter, Q12H, Melida Quitter, MD, 10 mL at 12/18/20 0952 .  sodium chloride flush (NS) 0.9 % injection 10-40 mL, 10-40 mL, Intracatheter, PRN, Melida Quitter, MD, 20 mL at 11/15/20 0544   Patients Current Diet:     Diet Order                      Diet - low sodium heart healthy              DIET DYS 3 Room service appropriate? Yes with Assist; Fluid consistency: Thin  Diet effective now                      Precautions / Restrictions Precautions Precautions: Fall Precaution Comments: PEG capped, trach capped, sacral wounds Restrictions Weight Bearing Restrictions: No    Has the patient had 2 or more falls or a fall with  injury in the past year? No   Prior Activity Level Community (5-7x/wk): Independent, works as an Optometrist, driving   Prior Functional Level Self Care: Did the patient need help bathing, dressing, using the toilet or eating? Independent   Indoor Mobility: Did the patient need assistance with walking from room  to room (with or without device)? Independent   Stairs: Did the patient need assistance with internal or external stairs (with or without device)? Independent   Functional Cognition: Did the patient need help planning regular tasks such as shopping or remembering to take medications? Independent   Home Assistive Devices / Equipment Home Assistive Devices/Equipment: None Home Equipment: Shower seat - built in   Prior Device Use: Indicate devices/aids used by the patient prior to current illness, exacerbation or injury? None of the above   Current Functional Level Cognition   Overall Cognitive Status: Within Functional Limits for tasks assessed Difficult to assess due to: Tracheostomy Current Attention Level: Selective Orientation Level: Oriented X4 Following Commands: Follows one step commands with increased time Safety/Judgement: Decreased awareness of deficits,Decreased awareness of safety General Comments: A&Ox4, very pleasant and motivated to improve strength.    Extremity Assessment (includes Sensation/Coordination)   Upper Extremity Assessment: Generalized weakness,RUE deficits/detail,LUE deficits/detail RUE Deficits / Details: advances whole arm utilizing momentum 2/2 weakness. weak grasp. RUE Sensation: decreased light touch RUE Coordination: decreased fine motor,decreased gross motor LUE Deficits / Details: weakness noted throughout LUE. Decreased gross and fine motor coordination. Reports ulnar aspect of hand/forearm numb. LUE Coordination: decreased fine motor,decreased gross motor  Lower Extremity Assessment: Defer to PT evaluation RLE Deficits / Details: no  toe wiggle, trace ankle movement, 2-/5 hip abduct/ADD, trace hamstring activation, no active hip/knee flexion LLE Deficits / Details: toe wiggle 2/5, trace ankle movement, 2-/5 hip abduct/ADD, trace hamstring activation, no active hip/knee flexion     ADLs   Overall ADL's : Needs assistance/impaired Eating/Feeding: Set up,Bed level Eating/Feeding Details (indicate cue type and reason): pt reports difficulty holding to utensils, using L rather than R UE (R handed) and attempting to coordinate R UE use into tasks. Provided red foam grips to trial for improved efficiency of self feeding. NT aware Grooming: Moderate assistance,Bed level,Oral care Grooming Details (indicate cue type and reason): Mod A overall for oral care routine with mouthwash swab and suction. able to bend elbow but due to shoulder weakness, unable to bring to mouth without HOH assist. Pt fatigued quickly with task. max A to wash face at end Lower Body Bathing: Total assistance,Bed level Lower Body Bathing Details (indicate cue type and reason): simulated via posterior pericare from bed level Upper Body Dressing Details (indicate cue type and reason): simulated with pt reaching to remove electrodes from torso, improving use of B UE Toilet Transfer Details (indicate cue type and reason): unable to to transfer however pt requried MAX A to sit EOB Toileting- Clothing Manipulation and Hygiene: Total assistance,Bed level Toileting - Clothing Manipulation Details (indicate cue type and reason): Total A for bed pan use and clean up Functional mobility during ADLs: Maximal assistance,+2 for physical assistance (bed mobility only; supine<>EOB) General ADL Comments: Session focused on transfers and sitting EOB tolerance. Trialed sara stedy but ultimately sara plus was a better match to facilitate pt coming into standing position.     Mobility   Overal bed mobility: Needs Assistance Bed Mobility: Supine to Sit Rolling: Min guard Sidelying  to sit: Mod assist Supine to sit: Max assist Sit to supine: Mod assist Sit to sidelying: Min assist General bed mobility comments: Assist mostly to powerup trunk and a little but to advance BLE completey off bed.     Transfers   Overall transfer level: Needs assistance Equipment used: Ambulation equipment used Transfer via Lift Equipment: Stedy (Stedy Plus) Transfers: Sit to/from Stand Sit to Stand: Mod  assist,+2 safety/equipment,+2 physical assistance Stand pivot transfers: Max assist,+2 physical assistance  Lateral/Scoot Transfers: Min assist General transfer comment: With use of sara plus pt able to come to standing position and hold for about 30 seconds.     Ambulation / Gait / Stairs / Wheelchair Mobility   Ambulation/Gait General Gait Details: not yet ready     Posture / Balance Dynamic Sitting Balance Sitting balance - Comments: able to support self sitting EOB for several minutes Balance Overall balance assessment: Needs assistance Sitting-balance support: Bilateral upper extremity supported,Feet supported Sitting balance-Leahy Scale: Fair Sitting balance - Comments: able to support self sitting EOB for several minutes Standing balance support: Bilateral upper extremity supported Standing balance-Leahy Scale: Poor Standing balance comment: in sara plus     Special needs/care consideration Decannulation trach 1/25 Designated visitor is girlfriend, [name]      20 Fr G tube Contact precautions       Salley Slaughter, RN  Registered Nurse  WOC  Consult Note      Addendum   Date of Service:  12/12/2020 10:01 AM                      Show:Clear all $RemoveBefore'[x]'gSZcdtDLijxJu$ ?Manual$R'[]'Jt$ ?Templa'[x]'$ ?Copied   Added by: $RemoveB'[x]'TlJWmTDP$ ?Salley Slaughter, RN     '[]'$ ?Hover for details   [place] Nurse wound follow up Patient receiving care in [facility] 2W23 Wound type:sacral DTI with MASD Wound bed: clean and pink over the buttocks;  3 areas on the buttocks appears to be from friction and shear with no drainage.  Much improvement noted today. Toes are black dry and stable.  Periwound: intact white from zinc based moisture barrier cream. Dressing procedure/placement/frequency: Continue zinc based moisture barrier cream (Desitin) to the buttocks; BID and PRN after each episode of incontinence. Remind patient to turn side to side to prevent worsening of wounds.  All wounds are stable. No need for WOC to continue to follow. Signing off at this time.   Monitor the wound area(s) for worsening of condition such as: Signs/symptoms of infection, increase in size, development of or worsening of odor, development of pain, or increased pain at the affected locations.   Notify the medical team if any of these develop.   Thank you for the consult. Please re-consult the [place] team if needed.   Cathlean Marseilles Tamala Julian, MSN, RN, CMSRN, AGCNS, [facility] Wound Treatment Associate Pager [phone]      Previous Home Environment  Living Arrangements:  (girlfriend, [name])  Lives With: Significant other Available Help at Discharge: Family,Available 24 hours/day Type of Home: House Home Layout: Multi-level Alternate Level Stairs-Number of Steps: Flight Home Access:  (2) Bathroom Shower/Tub: Multimedia programmer: Standard Bathroom Accessibility: Yes How Accessible: Accessible via walker [place]: No Additional Comments: Information provided by girlfriend   Discharge Living Setting Plans for Discharge Living Setting: Patient's home,Lives with (comment) (girlfriend, [name]) Type of Home at Discharge: House Discharge Home Layout: Multi-level,Able to live on main level with bedroom/bathroom Alternate Level Stairs-Number of Steps: flight Discharge Home Access: Stairs to enter Entrance Stairs-Rails: None Entrance Stairs-Number of Steps: 2 Discharge Bathroom Shower/Tub: Horticulturist, commercial: Standard Discharge Bathroom Accessibility: Yes How Accessible: Accessible via walker Does the  patient have any problems obtaining your medications?: No   Social/Family/Support Systems Contact Information: girlfriend, Economist Anticipated Caregiver: [name] Anticipated Caregiver's Contact Information: see above Ability/Limitations of Caregiver: she has back issues so can not lift Caregiver Availability: 24/7 Discharge Plan Discussed with Primary Caregiver:  Yes Is Caregiver In Agreement with Plan?: Yes Does Caregiver/Family have Issues with Lodging/Transportation while Pt is in Rehab?: No   Goals Patient/Family Goal for Rehab: supervision to min assist with PT and OT Expected length of stay: ELOS 2 to 3 weeks Pt/Family Agrees to Admission and willing to participate: Yes Program Orientation Provided & Reviewed with Pt/Caregiver Including Roles  & Responsibilities: Yes   Decrease burden of Care through IP rehab admission: n/a   Possible need for SNF placement upon discharge: not anticipated   Patient Condition: I have reviewed medical records from [facility] , spoken with  patient and family member. I met with patient at the bedside for inpatient rehabilitation assessment.  Patient will benefit from ongoing PT and OT, can actively participate in 3 hours of therapy a day 5 days of the week, and can make measurable gains during the admission.  Patient will also benefit from the coordinated team approach during an Inpatient Acute Rehabilitation admission.  The patient will receive intensive therapy as well as Rehabilitation physician, nursing, social worker, and care management interventions.  Due to bladder management, bowel management, safety, skin/wound care, disease management, medication administration, pain management and patient education the patient requires 24 hour a day rehabilitation nursing.  The patient is currently mod to max assist with mobility and basic ADLs.  Discharge setting and therapy post discharge at home with home health is anticipated.  Patient has agreed to  participate in the Acute Inpatient Rehabilitation Program and will admit today.   Preadmission Screen Completed By:  Cleatrice Burke, 12/18/2020 12:52 PM ______________________________________________________________________   Discussed status with Dr. Posey Pronto  on  12/18/2020 at  1330 and received approval for admission today.   Admission Coordinator:  Cleatrice Burke, RN, time  2423 Date  12/18/2020    Assessment/Plan: Diagnosis: CIP/CIM 1. Does the need for close, 24 hr/day Medical supervision in concert with the patient's rehab needs make it unreasonable for this patient to be served in a less intensive setting? Yes 2. Co-Morbidities requiring supervision/potential complications: RUE sports injuries, ABLA (repeat labs, consider transfusion if necessary to ensure appropriate perfusion for increased activity tolerance), slow transit consitpation 3. Due to safety, disease management and patient education, does the patient require 24 hr/day rehab nursing? Yes 4. Does the patient require coordinated care of a physician, rehab nurse, PT, OT, and SLP to address physical and functional deficits in the context of the above medical diagnosis(es)? Yes Addressing deficits in the following areas: balance, endurance, locomotion, strength, transferring, bathing, dressing, toileting and psychosocial support 5. Can the patient actively participate in an intensive therapy program of at least 3 hrs of therapy 5 days a week? Yes 6. The potential for patient to make measurable gains while on inpatient rehab is excellent 7. Anticipated functional outcomes upon discharge from inpatient rehab: supervision and min assist PT, supervision and min assist OT, n/a SLP 8. Estimated rehab length of stay to reach the above functional goals is: 20-24 days. 9. Anticipated discharge destination: Home 10. Overall Rehab/Functional Prognosis: excellent     MD Signature: Delice Lesch, MD, ABPMR            Revision History                        Note Details  Author Jamse Arn, MD File Time 12/18/2020  1:39 PM  Author Type Physician Status Signed  Last Editor Jamse Arn, MD Service Physical Medicine and  Rehabilitation

## 2020-12-18 NOTE — PMR Pre-admission (Signed)
PMR Admission Coordinator Pre-Admission Assessment  Patient: Jackson Figueroa is an 60 y.o., male MRN: [id number] DOB: 1961-07-19 Height: 6' (182.9 cm) Weight: 110.9 kg  Insurance Information HMO:     PPO: yes     PCP:      IPA:      80/20:      OTHER:  PRIMARY: [employer]      Policy#: [id number]      Subscriber: pt CM Name: Raquel Sarna      Phone#: [phone]     Fax#: [phone] Pre-Cert#: [id number] approved for 7 days      Employer:  Benefits:  Phone #: [phone]     Name: 1/15 Eff. Date: 04/23/2020    Deduct: $1500      Out of Pocket Max: $3000      Life Max: none CIR: 80%      SNF: 80% 120 day limit Outpatient: 80%     Co-Pay: visits per medical neccesity Home Health: 80%      Co-Pay: 120 visit limit DME: 80%     Co-Pay: 20% Providers: in network  SECONDARY: none  Financial Counselor:       Phone#:   The Engineer, petroleum" for patients in Inpatient Rehabilitation Facilities with attached "Privacy Act [place] Records" was provided and verbally reviewed with: N/A  Emergency Contact Information Contact Information    Name Relation Home Work Mobile   Delsin, Copen Sister   [phone]   decker, barb Significant other   [phone]      Current Medical History  Patient Admitting Diagnosis: post COVID debility; critical illness neuropathy   History of Present Illness: 60 year old man with unremarkable past medical history presented from [facility] with COVID 19 pneumonia on 10/20/2020. Severe hypoxemic respiratory failure with ARDS. Initially tested positive on 10/18/20. Subsequently intubated and transferred to [facility]. Was unable to wean from the ventilator and trach was place on 11/14/20. He has since been liberated from the trach and was tolerating PMSV on trach. PEG placed 11/21/20 and began on dysphagia diet.   From 12/16 until 12/26 underwent CRRT due to severe AKI due to ATN from septic shock and COVID. Transitioned to intermittent  hemodialysis and last dialysis was 12/28. Dialysis catheter now removed. 1/14 trach downsized to #6 cuff less, then capped with eventual decannulation on 12/25 and on room air.  Now tolerating Dysphagia 3 diet .     Patient's medical [facility] from [facility]  has been reviewed by the rehabilitation admission coordinator and physician.  Past Medical History  Past Medical History:  Diagnosis Date  . Obesity     Family History   family history is not on file.  Prior Rehab/Hospitalizations Has the patient had prior rehab or hospitalizations prior to admission? Yes  Has the patient had major surgery during 100 days prior to admission? Yes   Current Medications  Current Facility-Administered Medications:  .  0.9 %  sodium chloride infusion, , Intravenous, PRN, Candee Furbish, MD, Last Rate: 10 mL/hr at 12/05/20 0600, Infusion Verify at 12/05/20 0600 .  acetaminophen (TYLENOL) tablet 650 mg, 650 mg, Oral, Q6H PRN, [name] Merino, MD, 650 mg at 12/18/20 0956 .  albuterol (PROVENTIL) (2.5 MG/3ML) 0.083% nebulizer solution 2.5 mg, 2.5 mg, Nebulization, Q6H PRN, Domenic Polite, MD, 2.5 mg at 12/06/20 1617 .  chlorhexidine gluconate (MEDLINE KIT) (PERIDEX) 0.12 % solution 15 mL, 15 mL, Mouth Rinse, BID, Tanda Rockers, MD, 15 mL at 12/18/20 0951 .  Chlorhexidine Gluconate Cloth 2 % PADS 6 each, 6 each, Topical, Q0600, Melida Quitter, MD, 6 each at 12/18/20 [id number] .  docusate sodium (COLACE) capsule 50 mg, 50 mg, Oral, BID, [name] Merino, MD, 50 mg at 12/18/20 0951 .  famotidine (PEPCID) tablet 20 mg, 20 mg, Oral, BID, [name] Merino, MD, 20 mg at 12/18/20 0951 .  feeding supplement (ENSURE ENLIVE / ENSURE PLUS) liquid 237 mL, 237 mL, Oral, TID BM, Ghimire, Kuber, MD, 237 mL at 12/18/20 0952 .  heparin injection 5,000 Units, 5,000 Units, Subcutaneous, Q8H, Einar Grad, RPH, 5,000 Units at 12/18/20 0520 .  iohexol (OMNIPAQUE) 300 MG/ML solution 50 mL, 50 mL, Per Tube, Once PRN,  Gus Height, MD .  lip balm (CARMEX) ointment 1 application, 1 application, Topical, PRN, Melida Quitter, MD, 1 application at 10/93/23 1539 .  liver oil-zinc oxide (DESITIN) 40 % ointment, , Topical, PRN, Ander Slade, Adewale A, MD, Given at 12/08/20 2148 .  MEDLINE mouth rinse, 15 mL, Mouth Rinse, BID, [name] Merino, MD, 15 mL at 12/18/20 0952 .  metoprolol tartrate (LOPRESSOR) tablet 12.5 mg, 12.5 mg, Oral, BID, [name] Merino, MD, 12.5 mg at 12/18/20 0951 .  multivitamin with minerals tablet 1 tablet, 1 tablet, Oral, Daily, [name] Merino, MD, 1 tablet at 12/18/20 0952 .  QUEtiapine (SEROQUEL) tablet 25 mg, 25 mg, Oral, QHS, Ghimire, Kuber, MD, 25 mg at 12/17/20 2133 .  [employer], , Oral, PRN, Freda Jackson B, MD .  sodium chloride flush (NS) 0.9 % injection 10-40 mL, 10-40 mL, Intracatheter, Q12H, Melida Quitter, MD, 10 mL at 12/18/20 0952 .  sodium chloride flush (NS) 0.9 % injection 10-40 mL, 10-40 mL, Intracatheter, PRN, Melida Quitter, MD, 20 mL at 11/15/20 0544  Patients Current Diet:  Diet Order            Diet - low sodium heart healthy           DIET DYS 3 Room service appropriate? Yes with Assist; Fluid consistency: Thin  Diet effective now                 Precautions / Restrictions Precautions Precautions: Fall Precaution Comments: PEG capped, trach capped, sacral wounds Restrictions Weight Bearing Restrictions: No   Has the patient had 2 or more falls or a fall with injury in the past year? No  Prior Activity Level Community (5-7x/wk): Independent, works as an Optometrist, driving  Prior Functional Level Self Care: Did the patient need help bathing, dressing, using the toilet or eating? Independent  Indoor Mobility: Did the patient need assistance with walking from room to room (with or without device)? Independent  Stairs: Did the patient need assistance with internal or external stairs (with or without device)? Independent  Functional Cognition:  Did the patient need help planning regular tasks such as shopping or remembering to take medications? Independent  Home Assistive Devices / Equipment Home Assistive Devices/Equipment: None Home Equipment: Shower seat - built in  Prior Device Use: Indicate devices/aids used by the patient prior to current illness, exacerbation or injury? None of the above  Current Functional Level Cognition  Overall Cognitive Status: Within Functional Limits for tasks assessed Difficult to assess due to: Tracheostomy Current Attention Level: Selective Orientation Level: Oriented X4 Following Commands: Follows one step commands with increased time Safety/Judgement: Decreased awareness of deficits,Decreased awareness of safety General Comments: A&Ox4, very pleasant and motivated to improve strength.    Extremity Assessment (includes Sensation/Coordination)  Upper Extremity Assessment: Generalized weakness,RUE deficits/detail,LUE deficits/detail RUE  Deficits / Details: advances whole arm utilizing momentum 2/2 weakness. weak grasp. RUE Sensation: decreased light touch RUE Coordination: decreased fine motor,decreased gross motor LUE Deficits / Details: weakness noted throughout LUE. Decreased gross and fine motor coordination. Reports ulnar aspect of hand/forearm numb. LUE Coordination: decreased fine motor,decreased gross motor  Lower Extremity Assessment: Defer to PT evaluation RLE Deficits / Details: no toe wiggle, trace ankle movement, 2-/5 hip abduct/ADD, trace hamstring activation, no active hip/knee flexion LLE Deficits / Details: toe wiggle 2/5, trace ankle movement, 2-/5 hip abduct/ADD, trace hamstring activation, no active hip/knee flexion    ADLs  Overall ADL's : Needs assistance/impaired Eating/Feeding: Set up,Bed level Eating/Feeding Details (indicate cue type and reason): pt reports difficulty holding to utensils, using L rather than R UE (R handed) and attempting to coordinate R UE use into  tasks. Provided red foam grips to trial for improved efficiency of self feeding. NT aware Grooming: Moderate assistance,Bed level,Oral care Grooming Details (indicate cue type and reason): Mod A overall for oral care routine with mouthwash swab and suction. able to bend elbow but due to shoulder weakness, unable to bring to mouth without HOH assist. Pt fatigued quickly with task. max A to wash face at end Lower Body Bathing: Total assistance,Bed level Lower Body Bathing Details (indicate cue type and reason): simulated via posterior pericare from bed level Upper Body Dressing Details (indicate cue type and reason): simulated with pt reaching to remove electrodes from torso, improving use of B UE Toilet Transfer Details (indicate cue type and reason): unable to to transfer however pt requried MAX A to sit EOB Toileting- Clothing Manipulation and Hygiene: Total assistance,Bed level Toileting - Clothing Manipulation Details (indicate cue type and reason): Total A for bed pan use and clean up Functional mobility during ADLs: Maximal assistance,+2 for physical assistance (bed mobility only; supine<>EOB) General ADL Comments: Session focused on transfers and sitting EOB tolerance. Trialed sara stedy but ultimately sara plus was a better match to facilitate pt coming into standing position.    Mobility  Overal bed mobility: Needs Assistance Bed Mobility: Supine to Sit Rolling: Min guard Sidelying to sit: Mod assist Supine to sit: Max assist Sit to supine: Mod assist Sit to sidelying: Min assist General bed mobility comments: Assist mostly to powerup trunk and a little but to advance BLE completey off bed.    Transfers  Overall transfer level: Needs assistance Equipment used: Ambulation equipment used Transfer via Lift Equipment: Stedy (Stedy Plus) Transfers: Sit to/from Stand Sit to Stand: Mod assist,+2 safety/equipment,+2 physical assistance Stand pivot transfers: Max assist,+2 physical  assistance  Lateral/Scoot Transfers: Min assist General transfer comment: With use of sara plus pt able to come to standing position and hold for about 30 seconds.    Ambulation / Gait / Stairs / Wheelchair Mobility  Ambulation/Gait General Gait Details: not yet ready    Posture / Balance Dynamic Sitting Balance Sitting balance - Comments: able to support self sitting EOB for several minutes Balance Overall balance assessment: Needs assistance Sitting-balance support: Bilateral upper extremity supported,Feet supported Sitting balance-Leahy Scale: Fair Sitting balance - Comments: able to support self sitting EOB for several minutes Standing balance support: Bilateral upper extremity supported Standing balance-Leahy Scale: Poor Standing balance comment: in sara plus    Special needs/care consideration Decannulation trach 1/25 Designated visitor is girlfriend, [name]    20 Fr G tube Contact precautions    Salley Slaughter, RN  Registered Nurse  WOC  Consult Note    Addendum  Date of Service:  12/12/2020 10:01 AM              Show:Clear all $RemoveBefore'[x]'[id number]$ Manual$R'[]'Xu$ Templa'[x]'$ Copied  Added by: $RemoveB'[x]'[id number]$ Salley Slaughter, RN   '[]'$ Hover for details  [place] Nurse wound follow up Patient receiving care in [facility] 2W23 Wound type:sacral DTI with MASD Wound bed: clean and pink over the buttocks; 3 areas on the buttocks appears to be from friction and shear with no drainage. Much improvement noted today. Toes are black dry and stable. Periwound:intactwhite from zinc based moisture barrier cream. Dressing procedure/placement/frequency: Continue zinc based moisture barrier cream(Desitin)to the buttocks; BID and PRN after each episode of incontinence. Remind patient to turn side to side to prevent worsening of wounds.  All wounds are stable. No need for WOC to continue to follow. Signing off at this time.  Monitor the wound area(s) for worsening of condition such as: Signs/symptoms of infection,  increase in size, development of or worsening of odor, development of pain, or increased pain at the affected locations.  Notify the medical team if any of these develop.  Thank you for the consult. Please re-consult the [place] team if needed.  Cathlean Marseilles Tamala Julian, MSN, RN, CMSRN, AGCNS, [facility] Wound Treatment Associate Pager [phone]     Previous Home Environment  Living Arrangements:  (girlfriend, [name])  Lives With: Significant other Available Help at Discharge: Family,Available 24 hours/day Type of Home: House Home Layout: Multi-level Alternate Level Stairs-Number of Steps: Flight Home Access:  (2) Bathroom Shower/Tub: Multimedia programmer: Standard Bathroom Accessibility: Yes How Accessible: Accessible via walker [place]: No Additional Comments: Information provided by girlfriend  Discharge Living Setting Plans for Discharge Living Setting: Patient's home,Lives with (comment) (girlfriend, [name]) Type of Home at Discharge: House Discharge Home Layout: Multi-level,Able to live on main level with bedroom/bathroom Alternate Level Stairs-Number of Steps: flight Discharge Home Access: Stairs to enter Entrance Stairs-Rails: None Entrance Stairs-Number of Steps: 2 Discharge Bathroom Shower/Tub: Horticulturist, commercial: Standard Discharge Bathroom Accessibility: Yes How Accessible: Accessible via walker Does the patient have any problems obtaining your medications?: No  Social/Family/Support Systems Contact Information: girlfriend, Economist Anticipated Caregiver: [name] Anticipated Caregiver's Contact Information: see above Ability/Limitations of Caregiver: she has back issues so can not lift Caregiver Availability: 24/7 Discharge Plan Discussed with Primary Caregiver: Yes Is Caregiver In Agreement with Plan?: Yes Does Caregiver/Family have Issues with Lodging/Transportation while Pt is in Rehab?: No  Goals Patient/Family Goal for Rehab:  supervision to min assist with PT and OT Expected length of stay: ELOS 2 to 3 weeks Pt/Family Agrees to Admission and willing to participate: Yes Program Orientation Provided & Reviewed with Pt/Caregiver Including Roles  & Responsibilities: Yes  Decrease burden of Care through IP rehab admission: n/a  Possible need for SNF placement upon discharge: not anticipated  Patient Condition: I have reviewed medical records from [facility] , spoken with  patient and family member. I met with patient at the bedside for inpatient rehabilitation assessment.  Patient will benefit from ongoing PT and OT, can actively participate in 3 hours of therapy a day 5 days of the week, and can make measurable gains during the admission.  Patient will also benefit from the coordinated team approach during an Inpatient Acute Rehabilitation admission.  The patient will receive intensive therapy as well as Rehabilitation physician, nursing, social worker, and care management interventions.  Due to bladder management, bowel management, safety, skin/wound care, disease management, medication administration, pain management and patient education the patient requires  24 hour a day rehabilitation nursing.  The patient is currently mod to max assist with mobility and basic ADLs.  Discharge setting and therapy post discharge at home with home health is anticipated.  Patient has agreed to participate in the Acute Inpatient Rehabilitation Program and will admit today.  Preadmission Screen Completed By:  Cleatrice Burke, 12/18/2020 12:52 PM ______________________________________________________________________   Discussed status with Dr. Posey Pronto  on  12/18/2020 at  1330 and received approval for admission today.  Admission Coordinator:  Cleatrice Burke, RN, time  5366 Date  12/18/2020   Assessment/Plan: Diagnosis: CIP/CIM 1. Does the need for close, 24 hr/day Medical supervision in concert with the patient's rehab  needs make it unreasonable for this patient to be served in a less intensive setting? Yes 2. Co-Morbidities requiring supervision/potential complications: RUE sports injuries, ABLA (repeat labs, consider transfusion if necessary to ensure appropriate perfusion for increased activity tolerance), slow transit consitpation 3. Due to safety, disease management and patient education, does the patient require 24 hr/day rehab nursing? Yes 4. Does the patient require coordinated care of a physician, rehab nurse, PT, OT, and SLP to address physical and functional deficits in the context of the above medical diagnosis(es)? Yes Addressing deficits in the following areas: balance, endurance, locomotion, strength, transferring, bathing, dressing, toileting and psychosocial support 5. Can the patient actively participate in an intensive therapy program of at least 3 hrs of therapy 5 days a week? Yes 6. The potential for patient to make measurable gains while on inpatient rehab is excellent 7. Anticipated functional outcomes upon discharge from inpatient rehab: supervision and min assist PT, supervision and min assist OT, n/a SLP 8. Estimated rehab length of stay to reach the above functional goals is: 20-24 days. 9. Anticipated discharge destination: Home 10. Overall Rehab/Functional Prognosis: excellent   MD Signature: Delice Lesch, MD, ABPMR

## 2020-12-18 NOTE — Plan of Care (Signed)
  Problem: Education: Goal: Knowledge of General Education information will improve Description: Including pain rating scale, medication(s)/side effects and non-pharmacologic comfort measures 12/18/2020 1641 by Durwin Glaze, LPN Outcome: Progressing 12/18/2020 1641 by Durwin Glaze, LPN Outcome: Progressing   Problem: Education: Goal: Knowledge of General Education information will improve Description: Including pain rating scale, medication(s)/side effects and non-pharmacologic comfort measures 12/18/2020 1641 by Durwin Glaze, LPN Outcome: Progressing 12/18/2020 1641 by Durwin Glaze, LPN Outcome: Progressing

## 2020-12-18 NOTE — Discharge Summary (Signed)
Physician Discharge Summary  Jackson Figueroa UJW:[id number] DOB: 1961/04/30 DOA: 10/20/2020  PCP: Patient, No Pcp Per  Admit date: 10/20/2020 Discharge date: 12/18/2020  Admitted From: Home Disposition: CIR   Recommendations for Outpatient Follow-up:  1. Follow up with PCP in 1-2 weeks 2. Follow up with PCCM Pulmonary within 1-2 weeks  3. Please obtain CMP/CBC, Mag, Phos in one week 4. Please follow up on the following pending results:  Home Health: No Equipment/Devices: None  Discharge Condition: Stable  CODE STATUS: FULL CODE Diet recommendation: Dysphagia 3 Diet with Thin Liquid   Brief/Interim Summary: The patient is a 60 year old male patient with no significant past medical history was admitted to [facility] on 11/28 with severe hypoxemic respiratory failure secondary to COVID-pneumonia with ARDS.  He initially tested positive on 11/26.  Subsequently he was intubated on 11/29 and transferred to [facility]  Was unable to be weaned from the ventilator and a tracheostomy was placed on 12/23.  Since that time he has been liberated from the ventilator and weaned to room air and tolerating PMV on trach.  Significant events, 11/29, intubated on arrival and transfer to [facility].  Prolonged respiratory failure. 12/23, eventual tracheostomy, gradually weaning and now almost on room air with trach. 12/30, PEG tube placed.  Tolerating tube feeding.  Also started eating dysphagia diet. 12/16 -56/21, course complicated by severe AKI due to ATN from septic shock and COVID, required CRRT and then intermittent hemodialysis.  Last hemodialysis on 12/28.  Hemodialysis catheter removed and now renal functions improved. 1/10, transferred to general medical bed. 1/ 14, tracheostomy downsized to #6 cuffless. 1/17, remains on room air since then. 1/20, patient able to eat dysphagia 2 diet.  Currently on room air.  Highly motivated for rehab. 1/25, tracheostomy is capped  with plan to remove today.  He is eating adequate food.  Waiting for bed at CIR.  Course also complicated by severe acute kidney injury secondary to ATN in context of septic shock and COVID physiology.  He did require CRRT and then intermittent hemodialysis with last dialysis treatment on 12/28.  Temporary dialysis catheter has been removed and renal function has stabilized.  He also required a PEG tube and is now tolerating Dysphagia 3 solid diet.  Transferred out to a regular medical bed on 1/10.  **Interim History  His tracheostomy was decannulated on 12/17/2020 and he tolerated this well.  He is medically stable to be transferred to CIR and will need his PEG tube out after 6 weeks of being placed.  This can be done in CIR.  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 2. Persistent but improved bilateral interstitial prominence. Findings suggest improving  interstitial edema and or pneumonitis."  Acute metabolic encephalopathy secondary to recent ICU stay and [place] is currently appropriate -On low-dose nocturnal Seroquel which can likely be tapered and discontinued in  Rehab with Wit gradual tapering off   Critical illness Neuropathy/Profound physical deconditioning/persistent tachycardia -PT OT have evaluated patient and recommend SNF versus CIR; Needs aggressive REhab -Patient would have limited resources at home in regards to assisting with physical needs after discharge -TSH is normal -[facility] with normal EF -Currently deconditioned enough that even sitting up in the bed eating causes extreme fatigue -Continue low-dose beta-blocker-this admission unremarkable  Sacral deep tissue injury/upper medial thigh maceration/DTI toes     Pressure Injury 10/29/20 Sacrum Mid Deep Tissue Pressure Injury - Purple or maroon localized area of discolored intact skin or blood-filled blister due to damage of underlying soft tissue from pressure and/or shear. purple (Active)  Date First Assessed/Time First Assessed: 10/29/20 1300   Location: Sacrum  Location Orientation: Mid  Staging: Deep Tissue Pressure Injury - Purple or maroon localized area of discolored intact skin or blood-filled blister due to damage of underlying ...    Assessments 10/29/2020  4:05 PM 12/15/2020  8:00 AM  Dressing Type -- Foam - Lift dressing to assess site every shift  Dressing -- Changed  Wound Length (cm) 2 cm --  Wound Width (cm) 0.5 cm --  Wound Depth (cm) 0 cm --  Wound Surface Area (cm^2) 1 cm^2 --  Wound Volume (cm^3) 0 cm^3 --  Drainage Amount -- None     No Linked orders to display     Pressure Injury 11/06/20 Nose Right Unstageable - Full thickness tissue loss in which the base of the injury is covered by slough (yellow, tan, gray, green or brown) and/or eschar (tan, brown or black) in the wound bed. Red/black area above R nare (Active)  Date First Assessed/Time First Assessed: 11/06/20 1656   Location: Nose  Location Orientation: Right  Staging: Unstageable - Full thickness tissue loss in which the base of the injury is covered by slough (yellow, tan, gray, green or brown) and/or  esc...    Assessments 11/06/2020  4:57 PM 12/05/2020  2:00 PM  Dressing Type None None  Site / Wound Assessment Clean;Dry;Black;Red Clean  Peri-wound Assessment Intact Intact  Wound Length (cm) 1 cm --  Wound Width (cm) 0.75 cm --  Wound Depth (cm) 0 cm --  Wound Surface Area (cm^2) 0.75 cm^2 --  Wound Volume (cm^3) 0 cm^3 --     No Linked orders to display     Wound / Incision (Open or Dehisced) 11/18/20 (MASD) Moisture Associated Skin Damage Buttocks Left (Active)  Date First Assessed/Time First Assessed: 11/18/20 0815   Wound Type: (MASD) Moisture Associated Skin Damage  Location: Buttocks  Location Orientation: Left  Present on Admission: No    Assessments 11/18/2020  8:15 AM 12/15/2020  8:00 AM  Dressing Type Moisture barrier Moisture barrier  Dressing Changed -- Changed  Dressing Status None None  Dressing Change Frequency -- Daily  Site / Wound Assessment Red;Friable;Pink;Bleeding --  Wound Length (cm) 4 cm --  Wound Width (cm) 4 cm --  Wound Depth (cm) 0 cm --  Wound Volume (cm^3) 0 cm^3 --  Wound Surface Area (cm^2) 16 cm^2 --  Margins Unattached edges (unapproximated) --  Drainage Amount Scant Scant  Drainage Description Sanguineous Sanguineous  Treatment Cleansed;Other (Comment) --     No Linked orders to display     Wound / Incision (Open  or Dehisced) 11/18/20 Skin tear Vertebral column Upper skin tear (Active)  Date First Assessed/Time First Assessed: 11/18/20 2300   Wound Type: Skin tear  Location: Vertebral column  Location Orientation: Upper  Wound Description (Comments): skin tear  Present on Admission: (c) Yes    Assessments 11/18/2020 11:30 PM 12/05/2020  2:00 PM  Dressing Type Foam - Lift dressing to assess site every shift Foam - Lift dressing to assess site every shift  Dressing Changed New --  Dressing Status Clean;Dry;Intact Clean;Dry;Intact  Dressing Change Frequency PRN PRN  Site / Wound Assessment Pale;Pink Pale;Pink  Peri-wound Assessment --  Intact  Wound Length (cm) 6 cm --  Wound Width (cm) 1 cm --  Wound Depth (cm) 0 cm --  Wound Volume (cm^3) 0 cm^3 --  Wound Surface Area (cm^2) 6 cm^2 --  Margins Unattached edges (unapproximated) Unattached edges (unapproximated)  Drainage Amount None None  Treatment -- Cleansed     No Linked orders to display     Pressure Injury 11/18/20 Toe (Comment  which one) Anterior;Right Deep Tissue Pressure Injury - Purple or maroon localized area of discolored intact skin or blood-filled blister due to damage of underlying soft tissue from pressure and/or shear. DTI left  (Active)  Date First Assessed/Time First Assessed: 11/18/20 2330   Location: (c) Toe (Comment  which one)  Location Orientation: Anterior;Right  Staging: Deep Tissue Pressure Injury - Purple or maroon localized area of discolored intact skin or blood-filled bli...    Assessments 11/18/2020 11:30 PM 12/15/2020  8:00 AM  Dressing Type None None  Dressing -- Changed;Other (Comment)  Dressing Change Frequency -- Daily  State of Healing Eschar --  Site / Wound Assessment Clean;Dry --  Wound Length (cm) 4 cm --  Wound Width (cm) 3 cm --  Wound Depth (cm) 0 cm --  Wound Surface Area (cm^2) 12 cm^2 --  Wound Volume (cm^3) 0 cm^3 --  Drainage Amount None None     No Linked orders to display     Wound / Incision (Open or Dehisced) 12/02/20 Skin tear Penis red, pink (Active)  Date First Assessed/Time First Assessed: 12/02/20 0800   Wound Type: Skin tear  Location: Penis  Wound Description (Comments): red, pink    Assessments 12/02/2020  8:00 AM 12/05/2020  2:00 PM  Dressing Type None None  Site / Wound Assessment Painful;Pink;Red Pale;Pink  Peri-wound Assessment Intact Intact  Drainage Amount None None  Treatment Cleansed Cleansed     No Linked orders to display    Acute kidney injury secondary to sepsis physiology from COVID and ATN -Briefly required CRRT and intermittent hemodialysis -Dialysis with stable renal  function -Improved and Normalized  Hypernatremia -Improved  MDR stenotrophomonas pneumonia -Has completed all antibiotics but remains on contact isolation  Dysphagia -Resolved, tolerating regular diet -Has PEG and will need to be removed after 6 weeks of Placement (11/21/20); PEG tube feeding has now stopped   Discharge Instructions  Discharge Instructions    Call MD for:  difficulty breathing, headache or visual disturbances   Complete by: As directed    Call MD for:  extreme fatigue   Complete by: As directed    Call MD for:  hives   Complete by: As directed    Call MD for:  persistant dizziness or light-headedness   Complete by: As directed    Call MD for:  persistant nausea and vomiting   Complete by: As directed    Call MD for:  redness, tenderness, or  signs of infection (pain, swelling, redness, odor or green/yellow discharge around incision site)   Complete by: As directed    Call MD for:  severe uncontrolled pain   Complete by: As directed    Call MD for:  temperature >100.4   Complete by: As directed    Diet - low sodium heart healthy   Complete by: As directed    Discharge instructions   Complete by: As directed    You were cared for by a hospitalist during your hospital stay. If you have any questions about your discharge medications or the care you received while you were in the hospital after you are discharged, you can call the unit and ask to speak with the hospitalist on call if the hospitalist that took care of you is not available. Once you are discharged, your primary care physician will handle any further medical issues. Please note that NO REFILLS for any discharge medications will be authorized once you are discharged, as it is imperative that you return to your primary care physician (or establish a relationship with a primary care physician if you do not have one) for your aftercare needs so that they can reassess your need for medications and monitor  your lab values.  Follow up with PCP and Pulmonary. Further Care per Rehab. Take all medications as prescribed. If symptoms change or worsen please return to the ED for evaluation   Discharge wound care:   Complete by: As directed    Wound care  Daily      Comments: #1 Clean the entire buttocks area with no rinse cleaner, pat dry. Apply Desitin to prevent extended moisture associated with incontinence. Currently has a condom cath in place and a flexiseal. Apply daily or PRN soiling.  #2 Paint the left great toe with betadine and allow to air dry. Bilat feet are currently in Prevalon boots. Repeat daily Repeat daily.  12/02/20 1204    11/19/20 0838   Wound care  Until discontinued      Comments: Clean the area surrounding the trach opening with NS, dry as much as possible then apply trach drawtex split 4 x 4 gauze Kellie Simmering # [phone] around the trach tube. Change daily or PRN soiling.  11/19/20 0837   Increase activity slowly   Complete by: As directed      Allergies as of 12/18/2020   No Known Allergies     Medication List    TAKE these medications   acetaminophen 325 MG tablet Commonly known as: TYLENOL Take 2 tablets (650 mg total) by mouth every 6 (six) hours as needed for mild pain, fever or headache.   albuterol (2.5 MG/3ML) 0.083% nebulizer solution Commonly known as: PROVENTIL Take 3 mLs (2.5 mg total) by nebulization every 6 (six) hours as needed for wheezing or shortness of breath.   chlorhexidine gluconate (MEDLINE KIT) 0.12 % solution Commonly known as: PERIDEX 15 mLs by Mouth Rinse route 2 (two) times daily.   docusate sodium 50 MG capsule Commonly known as: COLACE Take 1 capsule (50 mg total) by mouth 2 (two) times daily.   famotidine 20 MG tablet Commonly known as: PEPCID Take 1 tablet (20 mg total) by mouth 2 (two) times daily.   feeding supplement Liqd Take 237 mLs by mouth 3 (three) times daily between meals.   lip balm ointment Apply 1 application  topically as needed for lip care (cold sores).   liver oil-zinc oxide 40 % ointment Commonly known as: DESITIN Apply  topically as needed for irritation.   metoprolol tartrate 25 MG tablet Commonly known as: LOPRESSOR Take 0.5 tablets (12.5 mg total) by mouth 2 (two) times daily.   multivitamin with minerals Tabs tablet Take 1 tablet by mouth daily.   QUEtiapine 25 MG tablet Commonly known as: SEROQUEL Take 1 tablet (25 mg total) by mouth at bedtime.   Resource ThickenUp Clear Powd Take 1 Container by mouth as needed.            Discharge Care Instructions  (From admission, onward)         Start     Ordered   12/18/20 0000  Discharge wound care:       Comments: Wound care  Daily      Comments: #1 Clean the entire buttocks area with no rinse cleaner, pat dry. Apply Desitin to prevent extended moisture associated with incontinence. Currently has a condom cath in place and a flexiseal. Apply daily or PRN soiling.  #2 Paint the left great toe with betadine and allow to air dry. Bilat feet are currently in Prevalon boots. Repeat daily Repeat daily.  12/02/20 1204    11/19/20 0838   Wound care  Until discontinued      Comments: Clean the area surrounding the trach opening with NS, dry as much as possible then apply trach drawtex split 4 x 4 gauze [place] # [phone]) around the trach tube. Change daily or PRN soiling.  11/19/20 6269   12/18/20 1234          Follow-up Information    [facility]. Go on 01/17/2021.   Why: You are scheduled for a hospital follow up on Friday, February, 25, 2022 at 09:30 am. Contact information: [address] [place] [address] [phone]             No Known Allergies  Consultations:  PCCM  Nephrology   Palliative Care Medicine  Procedures/Studies: CT ABDOMEN WO CONTRAST  Result Date: 11/20/2020 CLINICAL DATA:  Evaluate anatomy prior to potential percutaneous  gastrostomy tube placement. EXAM: CT ABDOMEN WITHOUT CONTRAST TECHNIQUE: Multidetector CT imaging of the abdomen was performed following the standard protocol without IV contrast. COMPARISON:  Chest radiograph-11/17/2020 FINDINGS: The lack of intravenous contrast limits the ability to evaluate solid abdominal organs. Lower chest: Limited visualization of the lower thorax demonstrates right basilar subpleural consolidative opacities, interseptal thickening and bronchiectasis within the imaged lung bases and right middle lobe. No pleural effusion. Borderline cardiomegaly. Trace amount of pericardial fluid, presumably physiologic. There is diffuse decreased attenuation of the intra cardiac blood pool suggestive of anemia. Tip of central venous catheter terminates within the superior aspect of the right atrium. Hepatobiliary: Normal hepatic contour. Normal noncontrast appearance of the gallbladder given degree of distention. No radiopaque gallstones. No ascites. Pancreas: Normal noncontrast appearance of the pancreas. Spleen: Borderline splenomegaly with the spleen measuring 13.2 cm in length. Adrenals/Urinary Tract: Punctate nonobstructing stones are seen within the inferior pole of the kidneys bilaterally (coronal images 58 and 59, series 5). No urinary obstruction. Dominant approximately 2.7 cm hypoattenuating (7 Hounsfield unit) exophytic lesion arising inferolateral aspect of the right kidney is incompletely characterized on the present examination though likely represents a renal cyst. Indeterminate left-sided renal lesions are identified with dominant hyperdense left-sided renal lesion measuring 3.6 x 2.8 cm (coronal image 50, series 5), potentially representative of a hyperdense cyst though incompletely characterized on the present examination. Normal noncontrast appearance of the bilateral adrenal glands. The urinary  bladder was not imaged. Stomach/Bowel: While there is a moderate to large amount of mesenteric  fat between the anterior wall of the stomach and ventral wall of the upper abdomen (measuring approximately 7.5 cm - axial image 20, series 3), there is no definitive interposition of either the hepatic parenchyma or the transverse colon and the percutaneous window will likely be improved with gastric insufflation. Enteric contrast is seen throughout the transverse colon. No evidence of enteric obstruction. No hiatal hernia. Vascular/Lymphatic: Scattered atherosclerotic plaque within a normal caliber abdominal aorta. No bulky retroperitoneal or mesenteric lymphadenopathy on this noncontrast examination. Other: There is a minimal amount of subcutaneous edema about the midline of the low back. Musculoskeletal: No acute or aggressive osseous abnormalities. Stigmata of dish within the lower thoracic spine. IMPRESSION: 1. Gastric anatomy amenable to attempted percutaneous gastrostomy tube placement as indicated. 2. Right basilar subpleural consolidative opacities atelectasis versus infiltrate/aspiration. 3. Interstitial thickening and bronchiectasis within the imaged lung bases, nonspecific with broad differential considerations including atypical infection and fibrosis. Further evaluation with dedicated chest CT could be performed as indicated. 4. Bilateral nonobstructing nephrolithiasis. 5. Indeterminate left-sided renal lesions, potentially representative of hypertension renal cysts, though incompletely characterized on the present examination. Further evaluation with nonemergent abdominal MRI could be performed as indicated. 6. Aortic Atherosclerosis (ICD10-I70.0). Electronically Signed   By: Sandi Mariscal M.D.   On: 11/20/2020 09:07   IR GASTROSTOMY TUBE MOD SED  Result Date: 11/21/2020 INDICATION: Persistent dysphagia, in need of long-term nutrition. EXAM: Fluoroscopically guided placement of percutaneous pull-through gastrostomy tube Interventional Radiologist:  Criselda Peaches, MD MEDICATIONS: 2 g Ancef, 1  mg glucagon; Antibiotics were administered within 1 hour of the procedure. ANESTHESIA/SEDATION: Versed 1 mg IV; Fentanyl 50 mcg IV Moderate Sedation Time:  13 minutes The patient was continuously monitored during the procedure by the interventional radiology nurse under my direct supervision. CONTRAST:  29mL OMNIPAQUE IOHEXOL 300 MG/ML  SOLN FLUOROSCOPY TIME:  Fluoroscopy Time: 3 minutes 6 seconds (37 mGy). COMPLICATIONS: None immediate. PROCEDURE: Informed written consent was obtained from the patient after a thorough discussion of the procedural risks, benefits and alternatives. All questions were addressed. Maximal Sterile Barrier Technique was utilized including caps, mask, sterile gowns, sterile gloves, sterile drape, hand hygiene and skin antiseptic. A timeout was performed prior to the initiation of the procedure. Maximal barrier sterile technique utilized including caps, mask, sterile gowns, sterile gloves, large sterile drape, hand hygiene, and chlorhexadine skin prep. An angled catheter was advanced over a wire under fluoroscopic guidance through the nose, down the esophagus and into the body of the stomach. The stomach was then insufflated with several 100 ml of air. Fluoroscopy confirmed location of the gastric bubble, as well as inferior displacement of the barium stained colon. Under direct fluoroscopic guidance, a single T-tack was placed, and the anterior gastric wall drawn up against the anterior abdominal wall. Percutaneous access was then obtained into the mid gastric body with an 18 gauge sheath needle. Aspiration of air, and injection of contrast material under fluoroscopy confirmed needle placement. An Amplatz wire was advanced in the gastric body and the access needle exchanged for a 9-French vascular sheath. A snare device was advanced through the vascular sheath and an Amplatz wire advanced through the angled catheter. The Amplatz wire was successfully snared and this was pulled up through  the esophagus and out the mouth. A 20-French Alinda Dooms MIC-PEG tube was then connected to the snare and pulled through the mouth, down the esophagus, into the  stomach and out to the anterior abdominal wall. Hand injection of contrast material confirmed intragastric location. The T-tack retention suture was then cut. The pull through peg tube was then secured with the external bumper and capped. The patient will be observed for several hours with the newly placed tube on low wall suction to evaluate for any post procedure complication. The patient tolerated the procedure well, there is no immediate complication. IMPRESSION: Successful placement of a 20 French pull through gastrostomy tube. Electronically Signed   By: Malachy Moan M.D.   On: 11/21/2020 17:38   DG Chest Port 1 View  Result Date: 12/17/2020 CLINICAL DATA:  Acute respiratory failure.  History of COVID. EXAM: PORTABLE CHEST 1 VIEW COMPARISON:  11/27/2020. FINDINGS: Tracheostomy tube in stable position. Heart size normal. Persistent but improved bilateral interstitial prominence. No pleural effusion or pneumothorax. Degenerative change thoracic spine. IMPRESSION: 1. Tracheostomy tube in stable position. 2. Persistent but improved bilateral interstitial prominence. Findings suggest improving interstitial edema and or pneumonitis. Electronically Signed   By: Maisie Fus  Register   On: 12/17/2020 06:34   DG CHEST PORT 1 VIEW  Result Date: 11/27/2020 CLINICAL DATA:  Respiratory failure. EXAM: PORTABLE CHEST 1 VIEW COMPARISON:  11/16/2020. FINDINGS: Interim removal of feeding tube. Tracheostomy tube in stable position. Stable cardiomegaly. Venous congestion. Persistent bilateral interstitial infiltrates with slight improvement in aeration on today's exam. No pleural effusion or pneumothorax. IMPRESSION: 1. Interim removal of feeding tube. Tracheostomy tube in stable position. 2. Persistent bilateral interstitial infiltrates with slight improvement  in aeration on today's exam. 3. Stable cardiomegaly.  No pulmonary venous congestion. Electronically Signed   By: Maisie Fus  Register   On: 11/27/2020 05:11   DG Abd Portable 1V  Result Date: 11/20/2020 CLINICAL DATA:  Feeding tube placement EXAM: PORTABLE ABDOMEN - 1 VIEW COMPARISON:  11/19/2020 FINDINGS: A central catheter is noted with tip projecting over the right atrium. A feeding tube is present entering the stomach and with tip in the expected vicinity of the duodenal bulb. Suspected mild cardiomegaly. Lower thoracic spondylosis. Stable hazy retro diaphragmatic density on the right. Paucity of bowel gas in the upper abdomen. IMPRESSION: 1. Feeding tube tip: duodenal bulb. 2. Stable hazy retro diaphragmatic density on the right. 3. Suspected mild cardiomegaly. Electronically Signed   By: Gaylyn Rong M.D.   On: 11/20/2020 12:53   DG Abd Portable 1V  Result Date: 11/19/2020 CLINICAL DATA:  Evaluate feeding tube placement. EXAM: PORTABLE ABDOMEN - 1 VIEW COMPARISON:  11/14/2020 FINDINGS: The tip of the feeding tube is not significantly changed in remains at the level of the GE junction. Bowel gas pattern is unremarkable. IMPRESSION: Feeding tube tip remains at the GE junction. Electronically Signed   By: Signa Kell M.D.   On: 11/19/2020 12:19   DG Swallowing Func-Speech Pathology  Result Date: 12/06/2020 Objective Swallowing Evaluation: Type of Study: MBS-Modified Barium Swallow Study  Patient Details Name: Ryland Tungate MRN: [id number] Date of Birth: [birth year] Today's Date: 12/06/2020 Time: SLP Start Time (ACUTE ONLY): 1020 -SLP Stop Time (ACUTE ONLY): 1040 SLP Time Calculation (min) (ACUTE ONLY): 20 min Past Medical History: Past Medical History: Diagnosis Date . Obesity  Past Surgical History: Past Surgical History: Procedure Laterality Date . IR GASTROSTOMY TUBE MOD SED  11/21/2020 . TRACHEOSTOMY TUBE PLACEMENT N/A 11/14/2020  Procedure: TRACHEOSTOMY;  Surgeon: Christia Reading, MD;  Location:  [facility];  Service: ENT;  Laterality: N/A; HPI: Pt is a 59 y.o. with PMH obesity who was admitted 11/28 with respiratory distress  and unresponsive at home with Covid 19. Pt with AKI and HD dependent. CRRT 12/16-12/26. Intubated 11/28 until trach 12/23. Encephalopathy since admission. PEG 12/30. 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  Subjective: alert, making jokes, confused Assessment / Plan / Recommendation CHL IP CLINICAL IMPRESSIONS 12/06/2020 Clinical Impression Pt presents with pharyngeal dysphagia characterized by reduced lingual retraction, reduced anterior laryngeal movement, reduced laryngeal elevation, and a pharyngeal delay. He demonstrated vallecular residue and pyriform sinus residue which increased with bolus size and with advancement of bolus consistency. Residue was reduced, but not eliminated with secondary swallows and with liquid washes. Penetration (PAS 5) and aspiration (PAS 7) were noted with thin liquids. Penetration (PAS 3) was observed with larger boluses of nectar thick liquids and a single instance of aspiration (PAS 7) was noted when consecutive swallows of nectar thick liquids were cued. All instances of aspiration resulted in coughing though the immediacy of coughing was inconsistent. Pt's cough was strong and effective in expelling the aspirate in the majority of cases. Penetration and aspiration of nectar thick liquids were eliminated with use of an effortful swallow. Pt's status will be liberalized from NPO to allow intake of floorstock purees (i.e., applesauce and pudding) and nectar thick liquids over the weekend and it is anticipated that a p.o. diet will be initiated next week. SLP will continue to follow pt. SLP Visit Diagnosis Dysphagia, pharyngeal phase (R13.13) Attention and concentration deficit following -- Frontal lobe and executive function deficit following -- Impact on safety and function  Mild-moderate aspiration risk   CHL IP TREATMENT RECOMMENDATION 12/06/2020 Treatment Recommendations Therapy as outlined in treatment plan below   Prognosis 12/06/2020 Prognosis for Safe Diet Advancement Good Barriers to Reach Goals Cognitive deficits Barriers/Prognosis Comment -- CHL IP DIET RECOMMENDATION 12/06/2020 SLP Diet Recommendations (No Data) Liquid Administration via Cup;Straw Medication Administration Via alternative means Compensations Slow rate;Small sips/bites Postural Changes Remain semi-upright after after feeds/meals (Comment);Seated upright at 90 degrees   CHL IP OTHER RECOMMENDATIONS 12/06/2020 Recommended Consults -- Oral Care Recommendations Oral care QID;Oral care before and after PO Other Recommendations --   CHL IP FOLLOW UP RECOMMENDATIONS 12/06/2020 Follow up Recommendations Skilled Nursing facility   [facility] IP FREQUENCY AND DURATION 12/06/2020 Speech Therapy Frequency (ACUTE ONLY) min 2x/week Treatment Duration 2 weeks      CHL IP ORAL PHASE 12/06/2020 Oral Phase WFL Oral - Pudding Teaspoon -- Oral - Pudding Cup -- Oral - Honey Teaspoon -- Oral - Honey Cup -- Oral - Nectar Teaspoon -- Oral - Nectar Cup -- Oral - Nectar Straw -- Oral - Thin Teaspoon -- Oral - Thin Cup -- Oral - Thin Straw -- Oral - Puree -- Oral - Mech Soft -- Oral - Regular -- Oral - Multi-Consistency -- Oral - Pill -- Oral Phase - Comment --  CHL IP PHARYNGEAL PHASE 12/06/2020 Pharyngeal Phase Impaired Pharyngeal- Pudding Teaspoon -- Pharyngeal -- Pharyngeal- Pudding Cup -- Pharyngeal -- Pharyngeal- Honey Teaspoon -- Pharyngeal -- Pharyngeal- Honey Cup -- Pharyngeal -- Pharyngeal- Nectar Teaspoon -- Pharyngeal -- Pharyngeal- Nectar Cup Delayed swallow initiation-vallecula;Delayed swallow initiation-pyriform sinuses;Reduced laryngeal elevation;Reduced anterior laryngeal mobility;Penetration/Aspiration before swallow;Penetration/Aspiration during swallow;Pharyngeal residue - valleculae;Pharyngeal residue - pyriform Pharyngeal  Material enters airway, remains ABOVE vocal cords and not ejected out Pharyngeal- Nectar Straw Delayed swallow initiation-vallecula;Delayed swallow initiation-pyriform sinuses;Reduced laryngeal elevation;Reduced anterior laryngeal mobility;Penetration/Aspiration before swallow;Penetration/Aspiration during swallow;Pharyngeal residue - valleculae;Pharyngeal residue - pyriform Pharyngeal Material enters airway, remains ABOVE vocal cords and not ejected out Pharyngeal- Thin Teaspoon Delayed  swallow initiation-vallecula;Delayed swallow initiation-pyriform sinuses;Reduced laryngeal elevation;Reduced anterior laryngeal mobility;Penetration/Aspiration before swallow;Penetration/Aspiration during swallow;Pharyngeal residue - valleculae;Pharyngeal residue - pyriform Pharyngeal Material enters airway, CONTACTS cords and not ejected out Pharyngeal- Thin Cup Delayed swallow initiation-vallecula;Delayed swallow initiation-pyriform sinuses;Reduced laryngeal elevation;Reduced anterior laryngeal mobility;Penetration/Aspiration before swallow;Penetration/Aspiration during swallow;Pharyngeal residue - valleculae;Pharyngeal residue - pyriform Pharyngeal Material enters airway, CONTACTS cords and not ejected out Pharyngeal- Thin Straw Delayed swallow initiation-vallecula;Delayed swallow initiation-pyriform sinuses;Reduced laryngeal elevation;Reduced anterior laryngeal mobility;Penetration/Aspiration before swallow;Penetration/Aspiration during swallow;Pharyngeal residue - valleculae;Pharyngeal residue - pyriform Pharyngeal Material enters airway, remains ABOVE vocal cords and not ejected out;Material enters airway, passes BELOW cords and not ejected out despite cough attempt by patient Pharyngeal- Puree Delayed swallow initiation-vallecula;Delayed swallow initiation-pyriform sinuses;Reduced laryngeal elevation;Reduced anterior laryngeal mobility;Pharyngeal residue - valleculae;Pharyngeal residue - pyriform Pharyngeal -- Pharyngeal-  Mechanical Soft -- Pharyngeal -- Pharyngeal- Regular Delayed swallow initiation-vallecula;Delayed swallow initiation-pyriform sinuses;Reduced laryngeal elevation;Reduced anterior laryngeal mobility;Pharyngeal residue - valleculae;Pharyngeal residue - pyriform Pharyngeal -- Pharyngeal- Multi-consistency -- Pharyngeal -- Pharyngeal- Pill Delayed swallow initiation-vallecula;Delayed swallow initiation-pyriform sinuses;Reduced laryngeal elevation;Reduced anterior laryngeal mobility;Pharyngeal residue - valleculae;Pharyngeal residue - pyriform Pharyngeal -- Pharyngeal Comment --  Shanika I. Hardin Negus, [place], [place] Office number [phone] Pager [phone] No flowsheet data found. Horton Marshall 12/06/2020, 2:25 PM               Subjective: Seen and examined at bedside and he was doing well. No CP or SOB no lightheadedness or dizziness. Tolerating diet. Ready to go to Rehab. No other concerns or complaints at this time. No other concerns and he feels well.   Discharge Exam: Vitals:   12/17/20 2135 12/18/20 0439  BP: 123/83 110/83  Pulse: 84 89  Resp: 17 18  Temp: 97.9 F (36.6 C) 99.2 F (37.3 C)  SpO2: 96% 99%   Vitals:   12/17/20 1549 12/17/20 1719 12/17/20 2135 12/18/20 0439  BP:  129/79 123/83 110/83  Pulse:  (!) 101 84 89  Resp:  $Remo'16 17 18  'VgPFy$ Temp:  98.2 F (36.8 C) 97.9 F (36.6 C) 99.2 F (37.3 C)  TempSrc:      SpO2: 94% 96% 96% 99%  Weight:      Height:       General: Pt is alert, awake, not in acute distress Cardiovascular: RRR, S1/S2 +, no rubs, no gallops Respiratory: Diminished bilaterally, no wheezing, no rhonchi; Unlabored breathing  Abdominal: Soft, NT, ND, bowel sounds + Extremities: no edema, no cyanosis  The results of significant diagnostics from this hospitalization (including imaging, microbiology, ancillary and laboratory) are listed below for reference.    Microbiology: No results found for this or any previous visit (from the  past 240 hour(s)).   Labs: BNP (last 3 results) Recent Labs    10/20/20 2043  BNP 15.1   Basic Metabolic Panel: No results for input(s): NA, K, CL, CO2, GLUCOSE, BUN, CREATININE, CALCIUM, MG, PHOS in the last 168 hours. Liver Function Tests: No results for input(s): AST, ALT, ALKPHOS, BILITOT, PROT, ALBUMIN in the last 168 hours. No results for input(s): LIPASE, AMYLASE in the last 168 hours. No results for input(s): AMMONIA in the last 168 hours. CBC: No results for input(s): WBC, NEUTROABS, HGB, HCT, MCV, PLT in the last 168 hours. Cardiac Enzymes: No results for input(s): CKTOTAL, CKMB, CKMBINDEX, TROPONINI in the last 168 hours. BNP: Invalid input(s): POCBNP CBG: Recent Labs  Lab 12/17/20 1144 12/17/20 1606 12/17/20 1946 12/17/20 2354 12/18/20 0752  GLUCAP 106* 94 109* 101* 93   D-Dimer No  results for input(s): DDIMER in the last 72 hours. Hgb A1c No results for input(s): HGBA1C in the last 72 hours. Lipid Profile No results for input(s): CHOL, HDL, LDLCALC, TRIG, CHOLHDL, LDLDIRECT in the last 72 hours. Thyroid function studies No results for input(s): TSH, T4TOTAL, T3FREE, THYROIDAB in the last 72 hours.  Invalid input(s): FREET3 Anemia work up No results for input(s): VITAMINB12, FOLATE, FERRITIN, TIBC, IRON, RETICCTPCT in the last 72 hours. Urinalysis    Component Value Date/Time   COLORURINE YELLOW 11/02/2020 1054   APPEARANCEUR CLOUDY (A) 11/02/2020 1054   LABSPEC 1.012 11/02/2020 1054   PHURINE 5.0 11/02/2020 1054   GLUCOSEU NEGATIVE 11/02/2020 1054   HGBUR LARGE (A) 11/02/2020 1054   BILIRUBINUR NEGATIVE 11/02/2020 1054   [place] 11/02/2020 1054   PROTEINUR 30 (A) 11/02/2020 1054   NITRITE NEGATIVE 11/02/2020 1054   LEUKOCYTESUR TRACE (A) 11/02/2020 1054   Sepsis Labs Invalid input(s): PROCALCITONIN,  WBC,  LACTICIDVEN Microbiology No results found for this or any previous visit (from the past 240 hour(s)).  Time coordinating  discharge: 35 minutes  SIGNED:  Kerney Elbe, DO [facility] 12/18/2020, 12:34 PM Pager is on [place]  If 7PM-7AM, please contact night-coverage [email or web address]

## 2020-12-18 NOTE — Progress Notes (Signed)
Inpatient Rehabilitation Admissions Coordinator    I have [facility] bed and insurance approval for admit today. I contacted Dr. Alfredia Ferguson and have received approval to admit to [facility] today. I have alerted acute team and TOC. I will make the arrangements to admit today.  Danne Baxter, RN, MSN Rehab Admissions Coordinator [phone] 12/18/2020 12:42 PM

## 2020-12-18 NOTE — Progress Notes (Signed)
Inpatient Rehabilitation Medication Review by a Pharmacist  A complete drug regimen review was completed for this patient to identify any potential clinically significant medication issues.  Clinically significant medication issues were identified:  No.  Check AMION for pharmacist assigned to patient if future medication questions/issues arise during this admission.   Time spent performing this drug regimen review (minutes):  5 min.   Blenda Nicely 12/18/2020 6:57 PM

## 2020-12-18 NOTE — Progress Notes (Addendum)
Occupational Therapy Treatment Patient Details Name: Jackson Figueroa MRN: [id number] DOB: April 26, 1961 Today's Date: 12/18/2020    History of present illness 60 yo admitted 11/28 with respiratory distress and unresponsive at home with Covid 19. Pt with AKI and HD dependent. CRRT 12/16-12/26. Trach 12/23. encephalopathy since admission. PEG 12/30.PMHx: obesity   OT comments  Pt progressing towards acute OT goals. +1 assist for bed mobility. Able to support self sitting EOB for several minutes. Utilizing sara plus, pt able to come to full, upright standing position and tolerated for about a minute and a half minutes. Up in recliner at end of session. D/c plan remains appropriate.    Follow Up Recommendations  CIR;Supervision/Assistance - 24 hour    Equipment Recommendations  Other (comment) (defer to next venue)    Recommendations for Other Services      Precautions / Restrictions Precautions Precautions: Fall Precaution Comments: PEG capped, trach capped, sacral wounds Restrictions Weight Bearing Restrictions: No       Mobility Bed Mobility Overal bed mobility: Needs Assistance Bed Mobility: Supine to Sit Rolling: Min guard   Supine to sit: Max assist     General bed mobility comments: Assist mostly to powerup trunk and a little but to advance BLE completey off bed.  Transfers Overall transfer level: Needs assistance Equipment used: Ambulation equipment used Transfers: Sit to/from Stand Sit to Stand: Mod assist;+2 safety/equipment;+2 physical assistance         General transfer comment: With use of sara plus pt able to come to standing position and hold for about one and a half minutes.    Balance Overall balance assessment: Needs assistance Sitting-balance support: Bilateral upper extremity supported;Feet supported Sitting balance-Leahy Scale: Fair Sitting balance - Comments: able to support self sitting EOB for several minutes   Standing balance support: Bilateral  upper extremity supported Standing balance-Leahy Scale: Poor Standing balance comment: in sara plus                           ADL either performed or assessed with clinical judgement   ADL Overall ADL's : Needs assistance/impaired                                       General ADL Comments: Session focused on transfers and sitting EOB tolerance. Trialed sara stedy but ultimately sara plus was a better match to facilitate pt coming into standing position.     Vision       Perception     Praxis      Cognition Arousal/Alertness: Awake/alert Behavior During Therapy: WFL for tasks assessed/performed Overall Cognitive Status: Within Functional Limits for tasks assessed                                          Exercises Other Exercises Other Exercises: Incorporated marches into repositioning recliner in the room. Other Exercises: reviewed HEP for BUE strength   Shoulder Instructions       General Comments      Pertinent Vitals/ Pain       Pain Assessment: Faces Faces Pain Scale: Hurts little more Pain Location: buttocks Pain Descriptors / Indicators: Sore Pain Intervention(s): Limited activity within patient's tolerance;Monitored during session;Repositioned  Home Living  Prior Functioning/Environment              Frequency  Min 2X/week        Progress Toward Goals  OT Goals(current goals can now be found in the care plan section)  Progress towards OT goals: Progressing toward goals  Acute Rehab OT Goals Patient Stated Goal: increase strength OT Goal Formulation: With patient/family Potential to Achieve Goals: Good ADL Goals Additional ADL Goal #1: Pt will tolerate 3 minutes in standing position utilizing lift equipement. Additional ADL Goal #2: Pt will complete bed mobility at min A level to prepare for EOB/OOB ADLs.  Plan Discharge plan remains  appropriate    Co-evaluation                 AM-PAC OT "6 Clicks" Daily Activity     Outcome Measure   Help from another person eating meals?: A Little Help from another person taking care of personal grooming?: A Lot Help from another person toileting, which includes using toliet, bedpan, or urinal?: Total Help from another person bathing (including washing, rinsing, drying)?: A Lot Help from another person to put on and taking off regular upper body clothing?: A Lot Help from another person to put on and taking off regular lower body clothing?: Total 6 Click Score: 11    End of Session Equipment Utilized During Treatment: Gait belt;Other (comment) (sara plus)  OT Visit Diagnosis: Other abnormalities of gait and mobility (R26.89);Unsteadiness on feet (R26.81);Muscle weakness (generalized) (M62.81)   Activity Tolerance Patient tolerated treatment well;Patient limited by fatigue   Patient Left in chair;with call bell/phone within reach   Nurse Communication Mobility status;Need for lift equipment        Time: 1000-1037 OT Time Calculation (min): 37 min  Charges:  $Self Care/Home Management 23-37 min  Tyrone Schimke, OT Acute Rehabilitation Services Pager: [phone] Office: [phone]   Hortencia Pilar 12/18/2020, 11:15 AM

## 2020-12-18 NOTE — Progress Notes (Signed)
Pt. Transferred in stable condition to 4th floor inpatient rehab. All belongings sent with pt.

## 2020-12-18 NOTE — Progress Notes (Signed)
Occupational Therapy Treatment Patient Details Name: Jackson Figueroa MRN: [id number] DOB: [birth year] Today's Date: 12/18/2020    History of present illness 60 yo admitted 11/28 with respiratory distress and unresponsive at home with Covid 19. Pt with AKI and HD dependent. CRRT 12/16-12/26. Trach 12/23. encephalopathy since admission. PEG 12/30.PMHx: obesity   OT comments  Pt tolerated sitting in recliner for about an hour. Trialed [name] Plus again but pt too fatigued and r/o increased pain in B knees, ultimately Hoyer lift for safe transfer back to bed. D/c plan remains appropriate.   Follow Up Recommendations  CIR    Equipment Recommendations  Other (comment) (defer to next venue)    Recommendations for Other Services      Precautions / Restrictions Precautions Precautions: Fall Precaution Comments: PEG capped, trach capped, sacral wounds Restrictions Weight Bearing Restrictions: No       Mobility Bed Mobility                  Transfers                 General transfer comment: Tried sara plus for back to bed but pt too fatigued. He was able to sit up for an hour. Hoyer lift ultimately for back to bed    Balance Overall balance assessment: Needs assistance Sitting-balance support: Bilateral upper extremity supported;Feet supported Sitting balance-Leahy Scale: Fair                                     ADL either performed or assessed with clinical judgement   ADL                                         General ADL Comments: Sat up in recliner for one hour then ready for back to bed. Utilized Hoyer lift due to increased fatigue on generalized weakness     Vision       Perception     Praxis      Cognition Arousal/Alertness: Awake/alert Behavior During Therapy: WFL for tasks assessed/performed Overall Cognitive Status: Within Functional Limits for tasks assessed                                           Exercises     Shoulder Instructions       General Comments      Pertinent Vitals/ Pain       Pain Assessment: Faces Faces Pain Scale: Hurts even more Pain Location: B knees>buttocks Pain Descriptors / Indicators: Sore Pain Intervention(s): Limited activity within patient's tolerance;Monitored during session;Repositioned  Home Living                                          Prior Functioning/Environment              Frequency  Min 2X/week        Progress Toward Goals  OT Goals(current goals can now be found in the care plan section)  Progress towards OT goals: Progressing toward goals  Acute Rehab OT Goals Patient Stated Goal: increase strength OT Goal Formulation: With patient/family Time For  Goal Achievement: 01/01/21 Potential to Achieve Goals: Good ADL Goals Pt Will Perform Grooming: with supervision;bed level;sitting Pt Will Perform Upper Body Bathing: with min assist;bed level;sitting Pt/caregiver will Perform Home Exercise Program: Increased strength;Both right and left upper extremity;With minimal assist;With written HEP provided Additional ADL Goal #1: Pt will tolerate 3 minutes in standing position utilizing lift equipement. Additional ADL Goal #2: Pt will complete bed mobility at min A level to prepare for EOB/OOB ADLs.  Plan Discharge plan remains appropriate    Co-evaluation                 AM-PAC OT "6 Clicks" Daily Activity     Outcome Measure   Help from another person eating meals?: A Little Help from another person taking care of personal grooming?: A Lot Help from another person toileting, which includes using toliet, bedpan, or urinal?: Total Help from another person bathing (including washing, rinsing, drying)?: A Lot Help from another person to put on and taking off regular upper body clothing?: A Lot Help from another person to put on and taking off regular lower body clothing?: Total 6 Click Score: 11     End of Session Equipment Utilized During Treatment: Other (comment) (trialed sara plus usedHoyer)  OT Visit Diagnosis: Other abnormalities of gait and mobility (R26.89);Unsteadiness on feet (R26.81);Muscle weakness (generalized) (M62.81)   Activity Tolerance Patient tolerated treatment well;Patient limited by fatigue   Patient Left in chair;with call bell/phone within reach   Nurse Communication Mobility status;Need for lift equipment        Time: 1150-1208 OT Time Calculation (min): 18 min  Charges: OT General Charges $OT Visit: 1 Visit OT Treatments $Self Care/Home Management : 8-22 mins  Tyrone Schimke, OT Acute Rehabilitation Services Pager: [phone] Office: [phone]    Hortencia Pilar 12/18/2020, 2:01 PM

## 2020-12-18 NOTE — H&P (Signed)
Physical Medicine and Rehabilitation Admission H&P    Chief Complaint  Patient presents with  . Critical illness neuropathy    HPI: Jackson Figueroa is a 60 year old male with h/o morbid obesity who was originally admitted with Covid PNA with septic shock on 10/12/2020 and treated with Covid protocol.  History from chart review and patient--has not been vaccinated against Covid.   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  He required tracheostomy on 11/14/2020 by Dr. Redmond Baseman and PEG placed on 11/21/2020 by Dr. Laurence Ferrari. Palliative care consulted to discuss goals of care and family elected on full scope of care.   He tolerated extubation to ATC, was weaned to room air on 12/09/2020.  He tolerated plugging and was decannulated on 12/17/2020.  Swallow function has improved and he has been advanced to dysphagia #2, thin liquids with use of aspiration precautions. 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 Therapy ongoing and working on endurance with sitting in a chair for an hour as well as trials in [place] [facility] to attempt weight bearing on BLE for 90 seconds. CIR recommended due to functional decline and severe deconditioning.  Please see preadmission assessment from earlier today as well.   Review of Systems  Constitutional: Positive for malaise/fatigue. Negative for chills and fever.  HENT: Negative for hearing loss and tinnitus.   Eyes: Positive for blurred vision (since admission). Negative for double vision.  Respiratory: Negative for cough, shortness of breath and wheezing.    Cardiovascular: Negative for chest pain and palpitations.  Gastrointestinal: Negative for constipation, heartburn and nausea.  Genitourinary: Negative for dysuria, hematuria and urgency.  Musculoskeletal: Positive for back pain, joint pain and myalgias.  Skin: Negative for itching and rash.  Neurological: Positive for sensory change and weakness.  Psychiatric/Behavioral: Negative for memory loss.  All other systems reviewed and are negative.     Past Medical History:  Diagnosis Date  . Allergic   . Knee pain, bilateral    due to old injuires  . Numbness and tingling in right hand    and elbow due to sports injury  . Obesity   . Staph infection    of appy wound--treated with antibiotics X 4 weeks.   . Tendinitis of elbow    right    Past Surgical History:  Procedure Laterality Date  . APPENDECTOMY  1982  . INGUINAL HERNIA REPAIR Left 2000  . IR GASTROSTOMY TUBE MOD SED  11/21/2020  . KNEE ARTHROSCOPY     bilateral knees  . TONSILLECTOMY AND ADENOIDECTOMY    . TRACHEOSTOMY TUBE PLACEMENT N/A 11/14/2020   Procedure: TRACHEOSTOMY;  Surgeon: Melida Quitter, MD;  Location: [facility];  Service: ENT;  Laterality: N/A;    Family History  Problem Relation Age of Onset  . Cerebral aneurysm Mother 70  . Heart disease Father   . 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 His significant other is disabled due to back issues--moved to [place] couple of years ago. He used to smoke 1-2 PPD --quit in 2013 after walking PNA. He has never used smokeless tobacco.  He reports current alcohol use--couple of glasses of wine and occasional liquor.  He reports that he does not use drugs.    Allergies: No Known Allergies    Medications Prior to Admission  Medication Sig Dispense Refill  . Multiple Vitamin (MULTIVITAMIN WITH MINERALS) TABS tablet Take 1 tablet by mouth daily.      Drug Regimen Review  Drug regimen  was reviewed and remains appropriate with no significant issues identified  Home: Home Living Family/patient expects to be discharged to:: Private residence Living Arrangements:  (girlfriend, Economist) Available Help at Discharge: Family,Available 24 hours/day Type of Home: House Home Access:  (2) Home Layout: Multi-level Alternate Level Stairs-Number of Steps: Flight Bathroom Shower/Tub: Multimedia programmer: Programmer, systems: Yes Home Equipment: Civil engineer, contracting - built in Additional Comments: Information provided by girlfriend  Lives With: Significant other   Functional History: Prior Function Level of Independence: Independent Comments: 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 Transfers Overall transfer level: Needs assistance Equipment used: Ambulation equipment used Transfer via Lift Equipment: Stedy (Stedy Plus) Transfers: Sit to/from Stand Sit to Stand: Mod assist,+2 safety/equipment,+2 physical assistance Stand pivot transfers: Max assist,+2 physical assistance  Lateral/Scoot Transfers: Min assist General transfer comment: Tried sara plus for back to bed but pt too fatigued. He was able to sit up for an hour. Hoyer lift ultimately for back to bed Ambulation/Gait General Gait Details: not yet ready    ADL: ADL Overall ADL's : Needs assistance/impaired Eating/Feeding: Set up,Bed level Eating/Feeding Details (indicate cue type and reason): pt reports difficulty holding to utensils, using L rather than R UE (R handed) and attempting to coordinate R UE use into tasks. Provided red foam grips to trial for  improved efficiency of self feeding. NT aware Grooming: Moderate assistance,Bed level,Oral care Grooming Details (indicate cue type and reason): Mod A overall for oral care routine with mouthwash swab and suction. able to bend elbow but due to shoulder weakness, unable to bring to mouth without HOH assist. Pt fatigued quickly with task. max A to wash face at end Lower Body Bathing: Total assistance,Bed level Lower Body Bathing Details (indicate cue type and reason): simulated via posterior pericare from bed level Upper Body Dressing Details (indicate cue type and reason): simulated with pt reaching to remove electrodes from torso, improving use of B UE Toilet Transfer Details (indicate cue type and reason): unable to to transfer however pt requried MAX A to sit EOB Toileting- Clothing Manipulation and Hygiene: Total assistance,Bed level Toileting - Clothing Manipulation Details (indicate cue type and reason): Total A for bed pan use and clean up Functional mobility during ADLs: Maximal assistance,+2 for physical assistance (bed mobility only; supine<>EOB) General ADL Comments: Sat up in recliner for one hour then ready for back to bed. Utilized Hoyer lift due to increased fatigue on generalized weakness  Cognition: Cognition Overall Cognitive Status: Within Functional Limits for tasks assessed Orientation Level: Oriented X4 Cognition Arousal/Alertness: Awake/alert Behavior During Therapy: WFL for tasks assessed/performed Overall Cognitive Status: Within Functional Limits for tasks assessed Area of Impairment: Safety/judgement Orientation Level: Disoriented to,Time Current Attention Level: Selective Memory: Decreased short-term memory Following Commands: Follows one step  commands with increased time Safety/Judgement: Decreased awareness of deficits,Decreased awareness of safety Problem Solving: Requires verbal cues General Comments: A&Ox4, very pleasant and motivated to improve  strength. Difficult to assess due to: Tracheostomy  Physical Exam: Blood pressure 103/77, pulse 86, temperature 98.3 F (36.8 C), temperature source Oral, resp. rate 18, height 6' (1.829 m), weight 110.9 kg, SpO2 100 %. Physical Exam Vitals reviewed.  Constitutional:      General: He is not in acute distress.    Appearance: He is obese. He is not ill-appearing.  HENT:     Head: Normocephalic and atraumatic.     Right Ear: External ear normal.     Left Ear: External ear normal.     Nose: Nose normal.  Eyes:     General:        Right eye: No discharge.        Left eye: No discharge.     Extraocular Movements: Extraocular movements intact.  Cardiovascular:     Rate and Rhythm: Normal rate and regular rhythm.  Pulmonary:     Effort: Pulmonary effort is normal. No respiratory distress.     Breath sounds: No stridor.  Abdominal:     General: Abdomen is flat. Bowel sounds are normal. There is no distension.  Musculoskeletal:     Cervical back: Normal range of motion and neck supple.     Comments: No edema or tenderness in extremities  Skin:    General: Skin is warm and dry.     Comments: Sacrum discolored with evidence of dried MASD with few small abraded areas-- extends to perineum--see picture below. Right great toe and right 2nd toe with dried blisters. Bilateral plantar surface with dry flaky skin.   Neurological:     Mental Status: He is alert.     Comments: Alert and oriented Motor: Right upper extremity: Shoulder abduction 3+/5, distally 3/5 Left upper extremity: 4 -/5 proximal distal Bilateral lower extremities: Hip flexion, knee extension 2+/5, ankle dorsiflexion 4/5 Sensation diminished to light touch distal to right elbow  Psychiatric:        Mood and Affect: Mood normal.        Behavior: Behavior normal.          Results for orders placed or performed during the hospital encounter of 10/20/20 (from the past 48 hour(s))  Glucose, capillary     Status: None    Collection Time: 12/16/20  7:11 PM  Result Value Ref Range   Glucose-Capillary 76 70 - 99 mg/dL    Comment: Glucose reference range applies only to samples taken after fasting for at least 8 hours.  Glucose, capillary     Status: Abnormal   Collection Time: 12/16/20 10:58 PM  Result Value Ref Range   Glucose-Capillary 100 (H) 70 - 99 mg/dL    Comment: Glucose reference range applies only to samples taken after fasting for at least 8 hours.  Glucose, capillary     Status: None   Collection Time: 12/17/20  3:15 AM  Result Value Ref Range   Glucose-Capillary 88 70 - 99 mg/dL    Comment: Glucose reference range applies only to samples taken after fasting for at least 8 hours.  Glucose, capillary     Status: None   Collection Time: 12/17/20  7:23 AM  Result Value Ref Range   Glucose-Capillary 99 70 - 99 mg/dL    Comment: Glucose reference range applies only to samples taken after fasting for at least 8 hours.  Glucose,  capillary     Status: Abnormal   Collection Time: 12/17/20 11:44 AM  Result Value Ref Range   Glucose-Capillary 106 (H) 70 - 99 mg/dL    Comment: Glucose reference range applies only to samples taken after fasting for at least 8 hours.  Glucose, capillary     Status: None   Collection Time: 12/17/20  4:06 PM  Result Value Ref Range   Glucose-Capillary 94 70 - 99 mg/dL    Comment: Glucose reference range applies only to samples taken after fasting for at least 8 hours.  Glucose, capillary     Status: Abnormal   Collection Time: 12/17/20  7:46 PM  Result Value Ref Range   Glucose-Capillary 109 (H) 70 - 99 mg/dL    Comment: Glucose reference range applies only to samples taken after fasting for at least 8 hours.  Glucose, capillary     Status: Abnormal   Collection Time: 12/17/20 11:54 PM  Result Value Ref Range   Glucose-Capillary 101 (H) 70 - 99 mg/dL    Comment: Glucose reference range applies only to samples taken after fasting for at least 8 hours.  Glucose,  capillary     Status: None   Collection Time: 12/18/20  7:52 AM  Result Value Ref Range   Glucose-Capillary 93 70 - 99 mg/dL    Comment: Glucose reference range applies only to samples taken after fasting for at least 8 hours.   DG Chest Port 1 View  Result Date: 12/17/2020 CLINICAL DATA:  Acute respiratory failure.  History of COVID. EXAM: PORTABLE CHEST 1 VIEW COMPARISON:  11/27/2020. FINDINGS: Tracheostomy tube in stable position. Heart size normal. Persistent but improved bilateral interstitial prominence. No pleural effusion or pneumothorax. Degenerative change thoracic spine. IMPRESSION: 1. Tracheostomy tube in stable position. 2. Persistent but improved bilateral interstitial prominence. Findings suggest improving interstitial edema and or pneumonitis. Electronically Signed   By: Marcello Moores  Register   On: 12/17/2020 06:34       Medical Problem List and Plan: 1.  Diffuse weakness with decreased endurance, balance deficits and pain affecting mobility and ADLs secondary to CIP/CIM.  -patient may shower  -ELOS/Goals: 27-30 days/supervision/min a  Admit to CIR 2.  Antithrombotics: -DVT/anticoagulation:  Pharmaceutical: Lovenox  -antiplatelet therapy: N/A 3. Pain Management: tylenol prn 4. Mood: LCSW to follow for evaluation and support.   -antipsychotic agents: Seroquel 25 5. Neuropsych: This patient is capable of making decisions on his own behalf. 6. Skin/Wound Care:    DTI with MASD: areas on buttocks improving.  Zinc for moisture barrier as well as pressure relief measures.   7. Fluids/Electrolytes/Nutrition: Monitor I/O. Encourage fluid intake. Continue to monitor lytes with serial checks.  8. Morbid obesity: Educate on importance of diet and weight loss to help promote overall health. Encourage side lying when in bed.  9. Pre-renal azotemia: Improving off tube feeds--encourage fluid intake.   CMP ordered for tomorrow. 10. Anemia of critical illness: Will monitor H/H with serial  checks.   CBC ordered for tomorrow. 11. Encephalopathy: Has resolved. Continue Seroquel to help with sleep hygeine.  12.  Dysphagia  D2 thins, advance diet as tolerated  Bary Leriche, PA-C 12/18/2020  I have personally performed a face to face diagnostic evaluation, including, but not limited to relevant history and physical exam findings, of this patient and developed relevant assessment and plan.  Additionally, I have reviewed and concur with the physician assistant's documentation above.  Delice Lesch, MD, ABPMR  The patient's status has not changed.  Any changes from the pre-admission screening or documentation from the acute chart are noted above.   Delice Lesch, MD, ABPMR

## 2020-12-18 NOTE — Progress Notes (Signed)
Patient arrived to unit and oriented to routines. No questions or concerns. Resting comfortably with call bell in place

## 2020-12-18 NOTE — Progress Notes (Signed)
Nutrition Follow-up  DOCUMENTATION CODES:   Non-severe (moderate) malnutrition in context of acute illness/injury  INTERVENTION:   Continue Ensure Enlive po TID, each supplement provides 350 kcal and 20 grams of protein  Continue Magic cup TID with meals, each supplement provides 290 kcal and 9 grams of protein  Continue MVI with minerals daily  NUTRITION DIAGNOSIS:   Moderate Malnutrition related to acute illness (COVID-19) as evidenced by moderate fat depletion,moderate muscle depletion,mild fat depletion,mild muscle depletion.  ongoing  GOAL:   Provide needs based on ASPEN/SCCM guidelines  progressing  MONITOR:   Vent status,Labs,Weight trends,TF tolerance,Skin  REASON FOR ASSESSMENT:   Ventilator,Consult Enteral/tube feeding initiation and management  ASSESSMENT:   60 year old male with no significant medical history. He was found to be COVID-19 positive on 11/26. He presented to [facility] with profound hypoxemia on 11/28 and was emergently intubated in the ED; found to be in ARDS and transferred to [facility].  11/28 intubated  11/29 proning initiated; OG tube placed  12/01 proning d/c 12/06 proning reinitiated  12/15 OGT clamped  12/16-12/23 pt required CRRT then iHD due to severe AKI 12/23 trach; OGT removed 12/24 NGT placed by IR 12/28 renal function improved and HD cath removed; NGT removed 12/29 Cortrak placed  12/30 PEG placed 12/31 Cortrak removed 1/10 transferred to floor 1/14 trach downsized to #6 cuffless 1/17 room air 1/20 diet advanced to dysphagia 2 with thin liquids 1/24 diet advanced to dysphagia 3 with thin liquids  Pt was decannulated yesterday. Pt stable on room air per respiratory therapy and CCM. Pt now pending d/c to CIR.  Pt with good appetite and eating with no difficulties. Limited meal documentation available, but pt's last two meals were documented as 100% completion. Pt also with orders for Ensure TID which he is consuming  well per RN. Continue with current nutrition plan of care.  UOP: 1251ml x24 hours  Labs reviewed. Medications: colace, pepcid, mvi with minerals  Diet Order:   Diet Order            DIET DYS 3 Room service appropriate? Yes with Assist; Fluid consistency: Thin  Diet effective now                 EDUCATION NEEDS:   No education needs have been identified at this time  Skin:  Skin Assessment: Skin Integrity Issues: Skin Integrity Issues:: DTI,Unstageable,Other (Comment) DTI: sacrum, toe Unstageable: full thickness to R nose Other: MASD buttocks  Last BM:  1/14  Height:   Ht Readings from Last 1 Encounters:  11/11/20 6' (1.829 m)    Weight:   Wt Readings from Last 1 Encounters:  11/22/20 110.9 kg   BMI:  Body mass index is 33.16 kg/m.  Estimated Nutritional Needs:   Kcal:  3557-3220  Protein:  160-180 gm  Fluid:  >/= 2.5 L    Larkin Ina, MS, RD, LDN RD pager number and weekend/on-call pager number located in [place].

## 2020-12-19 ENCOUNTER — Inpatient Hospital Stay (HOSPITAL_COMMUNITY): Payer: BC Managed Care – PPO

## 2020-12-19 ENCOUNTER — Inpatient Hospital Stay (HOSPITAL_COMMUNITY): Payer: BC Managed Care – PPO | Admitting: Occupational Therapy

## 2020-12-19 DIAGNOSIS — G6281 Critical illness polyneuropathy: Secondary | ICD-10-CM

## 2020-12-19 LAB — CBC WITH DIFFERENTIAL/PLATELET
Abs Immature Granulocytes: 0.11 10*3/uL — ABNORMAL HIGH (ref 0.00–0.07)
Basophils Absolute: 0 10*3/uL (ref 0.0–0.1)
Basophils Relative: 1 %
Eosinophils Absolute: 0.3 10*3/uL (ref 0.0–0.5)
Eosinophils Relative: 4 %
HCT: 30.4 % — ABNORMAL LOW (ref 39.0–52.0)
Hemoglobin: 9.5 g/dL — ABNORMAL LOW (ref 13.0–17.0)
Immature Granulocytes: 2 %
Lymphocytes Relative: 18 %
Lymphs Abs: 1.2 10*3/uL (ref 0.7–4.0)
MCH: 31.9 pg (ref 26.0–34.0)
MCHC: 31.3 g/dL (ref 30.0–36.0)
MCV: 102 fL — ABNORMAL HIGH (ref 80.0–100.0)
Monocytes Absolute: 0.7 10*3/uL (ref 0.1–1.0)
Monocytes Relative: 11 %
Neutro Abs: 4.3 10*3/uL (ref 1.7–7.7)
Neutrophils Relative %: 64 %
Platelets: 202 10*3/uL (ref 150–400)
RBC: 2.98 MIL/uL — ABNORMAL LOW (ref 4.22–5.81)
RDW: 14.4 % (ref 11.5–15.5)
WBC: 6.6 10*3/uL (ref 4.0–10.5)
nRBC: 0 % (ref 0.0–0.2)

## 2020-12-19 LAB — COMPREHENSIVE METABOLIC PANEL
ALT: 25 U/L (ref 0–44)
AST: 16 U/L (ref 15–41)
Albumin: 2.7 g/dL — ABNORMAL LOW (ref 3.5–5.0)
Alkaline Phosphatase: 68 U/L (ref 38–126)
Anion gap: 9 (ref 5–15)
BUN: 33 mg/dL — ABNORMAL HIGH (ref 6–20)
CO2: 26 mmol/L (ref 22–32)
Calcium: 9.2 mg/dL (ref 8.9–10.3)
Chloride: 103 mmol/L (ref 98–111)
Creatinine, Ser: 1.54 mg/dL — ABNORMAL HIGH (ref 0.61–1.24)
GFR, Estimated: 52 mL/min — ABNORMAL LOW (ref >60)
Glucose, Bld: 94 mg/dL (ref 70–99)
Potassium: 4.4 mmol/L (ref 3.5–5.1)
Sodium: 138 mmol/L (ref 135–145)
Total Bilirubin: 0.5 mg/dL (ref 0.3–1.2)
Total Protein: 5.7 g/dL — ABNORMAL LOW (ref 6.5–8.1)

## 2020-12-19 MED ORDER — PROSOURCE PLUS PO LIQD
30.0000 mL | Freq: Two times a day (BID) | ORAL | Status: DC
  Administered 2020-12-19 – 2021-01-24 (×69): 30 mL via ORAL
  Filled 2020-12-19 (×65): qty 30

## 2020-12-19 MED ORDER — TRAMADOL HCL 50 MG PO TABS
50.0000 mg | ORAL_TABLET | Freq: Four times a day (QID) | ORAL | Status: DC | PRN
  Administered 2020-12-20 – 2021-01-24 (×26): 50 mg via ORAL
  Filled 2020-12-19 (×27): qty 1

## 2020-12-19 NOTE — Progress Notes (Signed)
Inpatient Rehabilitation Care Coordinator Assessment and Plan Patient Details  Name: Jackson Figueroa MRN: [id number] Date of Birth: 04-01-1961  Today's Date: 12/19/2020  Hospital Problems: Principal Problem:   Critical illness polyneuropathy [facility]) Active Problems:   Debility  Past Medical History:  Past Medical History:  Diagnosis Date  . Allergic   . Knee pain, bilateral    due to old injuires  . Numbness and tingling in right hand    and elbow due to sports injury  . Obesity   . Staph infection    of appy wound--treated with antibiotics X 4 weeks.   . Tendinitis of elbow    right   Past Surgical History:  Past Surgical History:  Procedure Laterality Date  . APPENDECTOMY  1982  . INGUINAL HERNIA REPAIR Left 2000  . IR GASTROSTOMY TUBE MOD SED  11/21/2020  . KNEE ARTHROSCOPY     bilateral knees  . TONSILLECTOMY AND ADENOIDECTOMY    . TRACHEOSTOMY TUBE PLACEMENT N/A 11/14/2020   Procedure: TRACHEOSTOMY;  Surgeon: Melida Quitter, MD;  Location: [facility];  Service: ENT;  Laterality: N/A;   Social History:  reports that he quit smoking about 8 years ago. His smoking use included cigarettes. He smoked 2.00 packs per day. He has never used smokeless tobacco. He reports current alcohol use of about 2.0 standard drinks of alcohol per week. He reports that he does not use drugs.  Family / Support Systems Marital Status: Single Patient Roles: Partner,Parent Spouse/Significant Other: [name] [phone] Children: No children; partner [name] has 2 adult children Other Supports: None reported Anticipated Caregiver: [name] Ability/Limitations of Caregiver: partner can provide Mod A level of care only Caregiver Availability: 24/7 Family Dynamics: Pt lives with his partner and was working.  Social History Preferred language: English Religion:  Cultural Background: Pt ha worked as an Optometrist for 34 years. Education: college grad Read: Yes Write: Yes Employment Status: Employed Length  of Employment: 2 (years) Return to Work Plans: Pt would like to return to work as soon as possible. Legal History/Current Legal Issues: Denies Guardian/Conservator: N/A   Abuse/Neglect Abuse/Neglect Assessment Can Be Completed: Yes Physical Abuse: Denies Verbal Abuse: Denies Sexual Abuse: Denies Exploitation of patient/patient's resources: Denies Self-Neglect: Denies  Emotional Status Pt's affect, behavior and adjustment status: Pt in good spirits at time of visit Recent Psychosocial Issues: Denies Psychiatric History: Pt reports that he had some anxiety a few years ago but it has since passed per pt report Substance Abuse History: Pt admits he quit cigarettes 8 years ago as he was smoking 1+ppd; etoh- atleast 2 glasses of wine per night. Denies any rec drug use.  Patient / Family Perceptions, Expectations & Goals Pt/Family understanding of illness & functional limitations: Pt has general understanding of care needs Premorbid pt/family roles/activities: Independent Anticipated changes in roles/activities/participation: Assistance with ADLs/IADLs Pt/family expectations/goals: Pt goal is to improve his standing and sensation in his Right hand since he needs his hands in order to work (type at computer).  Community Resources [employer]s: None Premorbid Home Care/DME Agencies: None Transportation available at discharge: partner Resource referrals recommended: Neuropsychology  Discharge Planning Living Arrangements: Spouse/significant other Support Systems: Spouse/significant other Type of Residence: Private residence Administrator, sports: Multimedia programmer (specify) Nurse, mental health) Financial Resources: Employment Museum/gallery curator Screen Referred: No Living Expenses: Medical laboratory scientific officer Management: Patient Does the patient have any problems obtaining your medications?: No Home Management: Pt reports that he managed all homecare needs Patient/Family Preliminary Plans: no changes Care  Coordinator Barriers to Discharge: Decreased caregiver support,Lack  of/limited family support Care Coordinator Anticipated Follow Up Needs: HH/OP  Clinical Impression SW met with pt in room to introduce self, explain role, and discuss discharge process. Pt has no HCPOA; would like education on HCPOA. Pt has no DME.   Rana Snare 12/19/2020, 3:57 PM

## 2020-12-19 NOTE — Progress Notes (Signed)
Occupational Therapy Session Note  Patient Details  Name: Jackson Figueroa MRN: [id number] Date of Birth: 08-20-1961  Today's Date: 12/19/2020 OT Individual Time: 5573-2202 OT Individual Time Calculation (min): 55 min    Short Term Goals: Week 1:  OT Short Term Goal 1 (Week 1): Pt will be able to tolerate standing in [place] stedy for at least 3 minutes to demonstrate improved endurance. OT Short Term Goal 2 (Week 1): Pt will be able to don and doff pants over hips using a bridge in bed from supine. OT Short Term Goal 3 (Week 1): Pt will be able to reach R arm back behind his hips to be able to adjust pants and increase potential for full cleansing. OT Short Term Goal 4 (Week 1): Pt will don shirt with min A.  Skilled Therapeutic Interventions/Progress Updates:  Pt resting in bed upon arrival and ready for therapy. Supine>sit EOB with mod A to push up from sidelying. Sitting balance EOB with supervision. Sit<>stand with Denna Haggard. Standing tolerance approx 45 seconds. Pt requested to use BSC. Transfer with [employer]. Dependent for toileting. Pt with limited B/L shoulder AROM of ~90. Discussed role of OT and goals while seated EOB. Pt sat EOB approx 30 mins and 15 mins on BSC. Pt issued pink foam block for Rt hand strengthening. Pt returned to supine with mod A for BLE management. Pt remained in bed with all needs within reach and bed alarm activated.   Therapy Documentation Precautions:  Precautions Precautions: Fall Precaution Comments: PEG, sacral and toe wounds Restrictions Weight Bearing Restrictions: No Pain:  Pt denies pain this afternoon   Therapy/Group: Individual Therapy  [name] 12/19/2020, 2:43 PM

## 2020-12-19 NOTE — Evaluation (Signed)
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 Allergic   . Knee pain, bilateral    due to old injuires  . Numbness and tingling in right hand    and elbow due to sports injury  . Obesity   . Staph infection    of appy wound--treated with antibiotics X 4 weeks.   . Tendinitis of elbow    right   Past Surgical History:  Past Surgical History:  Procedure Laterality Date  . APPENDECTOMY  1982  . INGUINAL HERNIA REPAIR Left 2000  . IR GASTROSTOMY TUBE MOD SED  11/21/2020  . KNEE ARTHROSCOPY     bilateral knees  . TONSILLECTOMY AND ADENOIDECTOMY    . TRACHEOSTOMY TUBE PLACEMENT N/A 11/14/2020   Procedure: TRACHEOSTOMY;  Surgeon: Melida Quitter, MD;  Location: [facility];  Service: ENT;  Laterality: N/A;    Assessment & Plan Clinical Impression: Patient is a 60 y.o. year old male with  h/o morbid obesity who was originally admitted with Covid PNA with septic shock on 10/12/2020 and treated with Covid protocol.  History from chart review and patient--has not been vaccinated against Covid.   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  He required tracheostomy on 11/14/2020 by Dr. Redmond Baseman and PEG placed on 11/21/2020 by Dr. Laurence Ferrari. Palliative  care consulted to discuss goals of care and family elected on full scope of care.   He tolerated extubation to ATC, was weaned to room air on 12/09/2020.  He tolerated plugging and was decannulated on 12/17/2020.  Swallow function has improved and he has been advanced to dysphagia #2, thin liquids with use of aspiration precautions. 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 Therapy ongoing and working on endurance with sitting in a chair for an hour as well as trials in [place] to attempt weight bearing on BLE for 90 seconds. CIR recommended due to functional decline and severe deconditioning.  Please see preadmission assessment from earlier today as well. Patient currently requires total with mobility secondary to muscle weakness, decreased cardiorespiratoy endurance and decreased sitting balance, decreased standing balance, decreased postural control and decreased balance strategies.  Prior to hospitalization, patient was independent  with mobility and lived with Significant other (barb) in a House home.  Home access is "roughly full flight"Stairs to enter.  Patient will benefit from skilled PT intervention to maximize safe functional mobility, minimize fall risk and decrease caregiver burden for planned discharge home with 24 hour assist.  Anticipate patient will benefit from follow up [facility] at discharge.  PT - End of Session Activity Tolerance: Tolerates 10 - 20 min activity with multiple rests Endurance Deficit: Yes PT Assessment Rehab Potential (ACUTE/IP ONLY): Good PT Barriers to Discharge: [place] home environment;Decreased caregiver support;Home environment access/layout;Wound Care PT  Patient demonstrates impairments in the following area(s): Balance;Endurance;Motor;Pain;Safety;Sensory;Skin Integrity PT Transfers Functional Problem(s): Bed Mobility;Bed to  Chair;Car;Furniture PT Locomotion Functional Problem(s): Ambulation;Wheelchair Mobility;Stairs PT Plan PT Intensity: Minimum of 1-2 x/day ,45 to 90 minutes PT Frequency: 5 out of 7 days PT Duration Estimated Length of Stay: 4 weeks PT Treatment/Interventions: Ambulation/gait training;Discharge planning;Functional mobility training;Psychosocial support;Therapeutic Activities;Visual/perceptual remediation/compensation;Balance/vestibular training;Disease management/prevention;Neuromuscular re-education;Skin care/wound management;Therapeutic Exercise;Wheelchair propulsion/positioning;Cognitive remediation/compensation;DME/adaptive equipment instruction;Pain management;Splinting/orthotics;UE/LE Strength taining/ROM;Community reintegration;Functional electrical stimulation;Patient/family education;Stair training;UE/LE Coordination activities PT Transfers Anticipated Outcome(s): CGA PT Locomotion Anticipated Outcome(s): CGA PT Recommendation Recommendations for Other Services: Therapeutic Recreation consult Therapeutic Recreation Interventions: Stress management Follow Up Recommendations: Home health PT;Outpatient PT Patient destination: Home Equipment Recommended: To be determined   PT Evaluation Precautions/Restrictions Precautions Precautions: Fall Precaution Comments: PEG, sacral and toe wounds Restrictions Weight Bearing Restrictions: No General   Vital Signs  Pain Pain Assessment Pain Scale: 0-10 Pain Score: 5  Pain Type: Acute pain Pain Location: Buttocks Pain Onset: On-going Pain Intervention(s): Repositioned Home Living/Prior Functioning Home Living Available Help at Discharge: Family;Available 24 hours/day Type of Home: House Home Access: Stairs to enter [employer] of Steps: "roughly full flight" Entrance Stairs-Rails: Right;Left (can only reach 1 side) Home Layout: Multi-level (bedroom on 3rd floor, but can live on 1st floor, but 1 step to access bedroom on  1st floor) Alternate Level Stairs-Number of Steps: Flight Alternate Level Stairs-Rails: Right Bathroom Shower/Tub: Walk-in shower (access to shower chair) Bathroom Toilet: Standard Bathroom Accessibility: Yes  Lives With: Significant other (barb) Prior Function Level of Independence: Independent with basic ADLs;Independent with gait;Independent with homemaking with ambulation;Independent with transfers;Independent with homemaking with wheelchair  Able to Take Stairs?: Yes Driving: Yes Vocation: Full time employment 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, general practice: Within [place]: Intact  Cognition Overall Cognitive Status: Within Functional Limits for tasks assessed Arousal/Alertness: Awake/alert Orientation Level: Oriented X4 Memory: Appears intact Awareness: Appears intact Problem Solving: Appears intact Safety/Judgment: Appears intact Sensation Sensation Light Touch: Impaired Detail Peripheral sensation comments: reports N/T in R ulnar distribution Hot/Cold: Impaired Detail Hot/Cold Impaired Details: Impaired RUE Proprioception: Appears Intact Stereognosis: Appears Intact Motor  Motor Motor: Abnormal tone Motor - Skilled Clinical Observations: generally deconditioned with hypoytonia B LE, decreased B UE AROM against gravity   Trunk/Postural Assessment  Cervical Assessment Cervical Assessment: Exceptions to [facility] (forward head) Thoracic Assessment Thoracic Assessment: Exceptions to [facility] (increased kyphosis) Lumbar Assessment Lumbar Assessment: Exceptions to [facility] (posterior pelvic tilt) Postural Control Postural Control: Deficits on evaluation Righting Reactions: delayed and inadequate Protective Responses: delayed and inadequate Postural Limitations: limited ability to lean L outside BOS without losing balance  Balance Balance Balance Assessed: Yes Standardized Balance  Assessment Standardized Balance Assessment: Berg Balance Test Berg Balance Test Sit to Stand: Needs moderate or maximal assist to stand Standing Unsupported: Unable to stand 30 seconds unassisted Sitting with Back Unsupported but Feet Supported on Floor or Stool: Able to sit safely and securely 2 minutes Stand to Sit: Needs assistance to sit Transfers: Needs two people to assist of supervise to be safe Standing Unsupported with Eyes Closed: Needs help to keep from falling Standing Ubsupported with Feet Together: Needs help to attain position and unable to hold for 15 seconds From Standing, Reach Forward with Outstretched Arm: Loses balance while trying/requires external support From Standing Position, Pick up Object from Floor: Unable to try/needs assist to keep balance From Standing Position, Turn to Look Behind Over each Shoulder: Needs assist to keep from  losing balance and falling Turn 360 Degrees: Needs assistance while turning Standing Unsupported, Alternately Place Feet on Step/Stool: Needs assistance to keep from falling or unable to try Standing Unsupported, One Foot in Front: Loses balance while stepping or standing Standing on One Leg: Unable to try or needs assist to prevent fall Total Score: 4 Static Sitting Balance Static Sitting - Level of Assistance: 5: Stand by assistance Dynamic Sitting Balance Dynamic Sitting - Level of Assistance: 5: Stand by assistance Static Standing Balance Static Standing - Level of Assistance: Not tested (comment) (unable to stand at eval) Dynamic Standing Balance Dynamic Standing - Level of Assistance: Not tested (comment) (unable to stand at eval) Extremity Assessment      RLE Assessment RLE Assessment: Exceptions to [facility] RLE Strength Right Hip Flexion: 2/5 Right Hip ABduction: 3-/5 Right Hip ADduction: 3-/5 Right Knee Flexion: 2+/5 Right Knee Extension: 3-/5 Right Ankle Dorsiflexion: 3/5 Right Ankle Plantar Flexion: 3/5 LLE  Assessment LLE Assessment: Exceptions to [facility] LLE Strength Left Hip Flexion: 2/5 Left Hip ABduction: 3-/5 Left Hip ADduction: 3-/5 Left Knee Flexion: 2+/5 Left Knee Extension: 3-/5 Left Ankle Dorsiflexion: 3/5 Left Ankle Plantar Flexion: 3/5  Care Tool Care Tool Bed Mobility Roll left and right activity   Roll left and right assist level: Minimal Assistance - Patient > 75%    Sit to lying activity   Sit to lying assist level: Moderate Assistance - Patient 50 - 74%    Lying to sitting edge of bed activity   Lying to sitting edge of bed assist level: Moderate Assistance - Patient 50 - 74%     Care Tool Transfers Sit to stand transfer Sit to stand activity did not occur: Safety/medical concerns (due to weakness/fatigue) Sit to stand assist level:  (used Denna Haggard)    Chair/bed transfer Chair/bed transfer activity did not occur: Safety/medical concerns (due to weakness/fatigue)       Toilet transfer Toilet transfer activity did not occur: Safety/medical concerns (due to weakness/fatigue)      Scientist, product/process development transfer activity did not occur: Safety/medical concerns (due to weakness/fatigue)        Care Tool Locomotion Ambulation Ambulation activity did not occur: Safety/medical concerns (due to weakness/fatigue)        Walk 10 feet activity Walk 10 feet activity did not occur: Safety/medical concerns (due to weakness/fatigue)       Walk 50 feet with 2 turns activity Walk 50 feet with 2 turns activity did not occur: Safety/medical concerns (due to weakness/fatigue)      Walk 150 feet activity Walk 150 feet activity did not occur: Safety/medical concerns (due to weakness/fatigue)      Walk 10 feet on uneven surfaces activity Walk 10 feet on uneven surfaces activity did not occur: Safety/medical concerns (due to weakness/fatigue)      Stairs Stair activity did not occur: Safety/medical concerns (due to weakness/fatigue)        Walk up/down 1 step activity Walk  up/down 1 step or curb (drop down) activity did not occur: Safety/medical concerns (due to weakness/fatigue)     Walk up/down 4 steps activity did not occuR: Safety/medical concerns (due to weakness/fatigue)  Walk up/down 4 steps activity      Walk up/down 12 steps activity Walk up/down 12 steps activity did not occur: Safety/medical concerns (due to weakness/fatigue)      Pick up small objects from floor Pick up small object from the floor (from standing position) activity did not occur: Safety/medical concerns (due to  weakness/fatigue)      Wheelchair Will patient use wheelchair at discharge?: Yes Type of Wheelchair: Manual Wheelchair activity did not occur: Safety/medical concerns (due to weakness/fatigue)      Wheel 50 feet with 2 turns activity Wheelchair 50 feet with 2 turns activity did not occur: Safety/medical concerns (due to weakness/fatigue)    Wheel 150 feet activity Wheelchair 150 feet activity did not occur: Safety/medical concerns (due to weakness/fatigue)      Refer to Care Plan for Long Term Goals  SHORT TERM GOAL WEEK 1 PT Short Term Goal 1 (Week 1): Patient will be able to transition sit <> supine with CGA PT Short Term Goal 2 (Week 1): Patient will be able to transfer with LRAD and MaxA PT Short Term Goal 3 (Week 1): Patient will tolerate standing with LRAD for >1 min  Recommendations for other services: Therapeutic Recreation  Stress management  Skilled Therapeutic Intervention Mobility Bed Mobility Bed Mobility: Rolling Right;Rolling Left;Supine to Sit;Sit to Supine Rolling Right: Contact Guard/Touching assist (bed rails) Rolling Left: Contact Guard/Touching assist (bed rails) Supine to Sit: Moderate Assistance - Patient 50-74% Sit to Supine: Moderate Assistance - Patient 50-74% Locomotion  Gait Ambulation: No Gait Gait: No Stairs / Additional Locomotion Stairs: No Wheelchair Mobility Wheelchair Mobility: No  Patient received supine in bed,  agreeable to PT eval. He denies pain when asked, but endorses fatigue. Patient grossly deconditioned with general weakness. Patient unable to move B LE against gravity at bedlevel, but was able to compensate by moving B legs through gravity eliminated position to achieve hooklying to assist with donning pants. MaxA to don pants. Patient limited in eval by fatigue. He was able to come to sit edge of bed with ModA, but before shoulders could move anterior to hips, patient with LOB posteriorly with limited to no protective reflexes noted. Once sitting, patient able to remain there with stand by assist for prolonged period of time. Partial stand attempts with MaxA x1 and BUE support. Patient unable to clear bottom from bed at time of eval. At end of session, patient remaining in bed, bed alarm on, call light within reach.   Discharge Criteria: Patient will be discharged from PT if patient refuses treatment 3 consecutive times without medical reason, if treatment goals not met, if there is a change in medical status, if patient makes no progress towards goals or if patient is discharged from hospital.  The above assessment, treatment plan, treatment alternatives and goals were discussed and mutually agreed upon: by patient  Debbora Dus 12/19/2020, 10:07 AM

## 2020-12-19 NOTE — Progress Notes (Signed)
Patient ID: Jackson Figueroa, male   DOB: 1961-02-08, 60 y.o.   MRN: [id number]   SW received FMLA forms from pt assigned RN. SW provided FMLA forms to [employer].  Loralee Pacas, MSW, [place] Office: [phone] Cell: [phone] Fax: [phone]

## 2020-12-19 NOTE — Evaluation (Signed)
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 Allergic   . Knee pain, bilateral    due to old injuires  . Numbness and tingling in right hand    and elbow due to sports injury  . Obesity   . Staph infection    of appy wound--treated with antibiotics X 4 weeks.   . Tendinitis of elbow    right   Past Surgical History:  Past Surgical History:  Procedure Laterality Date  . APPENDECTOMY  1982  . INGUINAL HERNIA REPAIR Left 2000  . IR GASTROSTOMY TUBE MOD SED  11/21/2020  . KNEE ARTHROSCOPY     bilateral knees  . TONSILLECTOMY AND ADENOIDECTOMY    . TRACHEOSTOMY TUBE PLACEMENT N/A 11/14/2020   Procedure: TRACHEOSTOMY;  Surgeon: Melida Quitter, MD;  Location: [facility];  Service: ENT;  Laterality: N/A;    Assessment & Plan Clinical Impression: Jackson Figueroa is a 60 year old male with h/o morbid obesity who was originally admitted with Covid PNA with septic shock on 10/12/2020 and treated with Covid protocol.  History from chart review and patient--has not been vaccinated against Covid.   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  He required tracheostomy on 11/14/2020 by Dr. Redmond Baseman and PEG placed on  11/21/2020 by Dr. Laurence Ferrari. Palliative care consulted to discuss goals of care and family elected on full scope of care.    He tolerated extubation to ATC, was weaned to room air on 12/09/2020.  He tolerated plugging and was decannulated on 12/17/2020.  Swallow function has improved and he has been advanced to dysphagia #2, thin liquids with use of aspiration precautions. 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 Therapy ongoing and working on endurance with sitting in a chair for an hour as well as trials in [place] to attempt weight bearing on BLE for 90 seconds. CIR recommended due to functional decline and severe deconditioning.  Please see preadmission assessment from earlier today as well.   Patient transferred to CIR on 12/18/2020 .    Patient currently requires max with basic self-care skills secondary to muscle weakness and muscle joint tightness, decreased cardiorespiratoy endurance and decreased sitting balance, decreased standing balance and decreased balance strategies.  Prior to hospitalization, patient was fully independent but did have a history of R arm weakness and L shoulder weakness.  Patient will benefit from skilled intervention to increase independence with basic self-care skills prior to discharge home with care partner.  Anticipate patient will require minimal physical assistance and follow up home health.  OT - End of Session Activity Tolerance: Tolerates 10 - 20 min activity with multiple rests Endurance Deficit: Yes OT Assessment Rehab Potential (ACUTE ONLY): Good OT Barriers to Discharge: Inaccessible  home environment;Home environment access/layout OT Barriers to Discharge Comments: full flight of stairs to enter home (front and back) OT Patient demonstrates impairments in the following area(s): Balance;Endurance;Motor;Sensory;Pain OT Basic ADL's  Functional Problem(s): Eating;Grooming;Bathing;Dressing;Toileting OT Transfers Functional Problem(s): Toilet;Tub/Shower OT Additional Impairment(s): Fuctional Use of Upper Extremity OT Plan OT Intensity: Minimum of 1-2 x/day, 45 to 90 minutes OT Frequency: 5 out of 7 days OT Duration/Estimated Length of Stay: 28-30 days OT Treatment/Interventions: Balance/vestibular training;Discharge planning;DME/adaptive equipment instruction;Functional mobility training;Pain management;Patient/family education;Self Care/advanced ADL retraining;Therapeutic Exercise;UE/LE Strength taining/ROM;Therapeutic Activities;UE/LE Coordination activities OT Self Feeding Anticipated Outcome(s): independent OT Basic Self-Care Anticipated Outcome(s): CGA OT Toileting Anticipated Outcome(s): CGA OT Bathroom Transfers Anticipated Outcome(s): CGA OT Recommendation Patient destination: Home Follow Up Recommendations: Home health OT Equipment Recommended: To be determined   OT Evaluation Precautions/Restrictions  Restrictions Weight Bearing Restrictions: No Pain Pain Assessment Pain Scale: 0-10 Pain Score: 5  Pain Type: Acute pain Pain Location: Buttocks Pain Onset: On-going Pain Intervention(s): Repositioned Home Living/Prior Functioning Home Living Family/patient expects to be discharged to:: Private residence Living Arrangements: Spouse/significant other Available Help at Discharge: Family,Available 24 hours/day Type of Home: House Home Access: Stairs to enter [employer] of Steps: "roughly full flight" Entrance Stairs-Rails: Right,Left (can only reach 1 side) Home Layout: Multi-level (bedroom on 3rd floor, but can live on 1st floor, but 1 step to access bedroom on 1st floor) Alternate Level Stairs-Number of Steps: Flight Alternate Level Stairs-Rails: Right Bathroom Shower/Tub: Walk-in shower (access to shower chair) Bathroom Toilet: Standard Bathroom Accessibility: Yes  Lives With:  Significant other (barb) Prior Function Level of Independence: Independent with basic ADLs,Independent with gait,Independent with homemaking with ambulation,Independent with transfers,Independent with homemaking with wheelchair  Able to Take Stairs?: Yes Driving: Yes Vocation: Full time employment 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 Baseline Vision/History: Wears glasses Wears Glasses: Distance only Patient Visual Report: Blurring of vision (reporting increased difficulty seeing objects closer to him) Vision Assessment?: No apparent visual deficits Perception  Perception: Within Functional Limits Praxis Praxis: Intact Cognition Overall Cognitive Status: Within Functional Limits for tasks assessed Arousal/Alertness: Awake/alert Orientation Level: Person;Place;Situation Person: Oriented Place: Oriented Situation: Oriented Year: 2022 Month: January Day of Week: Correct Memory: Appears intact Immediate Memory Recall: Sock;Blue;Bed Memory Recall Sock: Without Cue Memory Recall Blue: Without Cue Memory Recall Bed: Without Cue Awareness: Appears intact Problem Solving: Appears intact Safety/Judgment: Appears intact Sensation Sensation Light Touch: Impaired Detail Peripheral sensation comments: reports N/T in R ulnar distribution Hot/Cold: Impaired Detail Hot/Cold Impaired Details: Impaired RUE Proprioception: Appears Intact Stereognosis: Appears Intact Coordination Gross Motor Movements are Fluid and Coordinated: No Fine Motor Movements are Fluid and Coordinated: No Coordination and Movement Description: limited due to limited AROM in shoulders and prior nerve impairment in RUE Motor  Motor Motor: Abnormal tone Motor - Skilled Clinical Observations: generally deconditioned with hypoytonia B LE, decreased B UE AROM against gravity  Trunk/Postural Assessment  Cervical Assessment Cervical Assessment: Exceptions to [facility] (forward  head) Thoracic Assessment Thoracic Assessment: Exceptions to [facility] (increased kyphosis) Lumbar Assessment Lumbar Assessment: Exceptions to [facility] (posterior pelvic tilt) Postural Control Postural Control: Deficits on evaluation Righting Reactions: delayed and inadequate Protective Responses: delayed and inadequate Postural Limitations: limited ability to lean L outside BOS without losing balance  Balance Balance Balance Assessed: Yes Standardized Balance Assessment Standardized Balance Assessment: Berg Balance Test Berg Balance Test Sit to Stand: Needs moderate or maximal assist to stand Standing Unsupported: Unable to stand 30 seconds unassisted Sitting with Back Unsupported but Feet  Supported on Floor or Stool: Able to sit safely and securely 2 minutes Stand to Sit: Needs assistance to sit Transfers: Needs two people to assist of supervise to be safe Standing Unsupported with Eyes Closed: Needs help to keep from falling Standing Ubsupported with Feet Together: Needs help to attain position and unable to hold for 15 seconds From Standing, Reach Forward with Outstretched Arm: Loses balance while trying/requires external support From Standing Position, Pick up Object from Floor: Unable to try/needs assist to keep balance From Standing Position, Turn to Look Behind Over each Shoulder: Needs assist to keep from losing balance and falling Turn 360 Degrees: Needs assistance while turning Standing Unsupported, Alternately Place Feet on Step/Stool: Needs assistance to keep from falling or unable to try Standing Unsupported, One Foot in Front: Loses balance while stepping or standing Standing on One Leg: Unable to try or needs assist to prevent fall Total Score: 4 Static Sitting Balance Static Sitting - Level of Assistance: 5: Stand by assistance Dynamic Sitting Balance Dynamic Sitting - Level of Assistance: 5: Stand by assistance Static Standing Balance Static Standing - Level of Assistance:  Not tested (comment) (unable to stand at eval) Dynamic Standing Balance Dynamic Standing - Level of Assistance: Not tested (comment) (unable to stand at eval) Extremity/Trunk Assessment RUE Assessment Passive Range of Motion (PROM) Comments: WFL Active Range of Motion (AROM) Comments: sh flexion to 60 -prior shoulder injuries, limited shoulder extension to reach to his back side General Strength Comments: 3/5 overall within AROM limitations, needs to use foam grips on utensils for weak grasp LUE Assessment Passive Range of Motion (PROM) Comments: WFL Active Range of Motion (AROM) Comments: sh flexion 80 General Strength Comments: 3+/5 within shoulder limitations  Care Tool Care Tool Self Care Eating   Eating Assist Level: Set up assist    Oral Care    Oral Care Assist Level: Set up assist    Bathing   Body parts bathed by patient: Right arm;Left arm;Chest;Abdomen;Face Body parts bathed by helper: Front perineal area;Buttocks;Right upper leg;Left upper leg;Right lower leg;Left lower leg   Assist Level: Moderate Assistance - Patient 50 - 74%    Upper Body Dressing(including orthotics)   What is the patient wearing?: Pull over shirt   Assist Level: Minimal Assistance - Patient > 75%    Lower Body Dressing (excluding footwear)   What is the patient wearing?: Pants Assist for lower body dressing: Maximal Assistance - Patient 25 - 49%    Putting on/Taking off footwear   What is the patient wearing?: Non-skid slipper socks Assist for footwear: Dependent - Patient 0%       Care Tool Toileting Toileting activity    not assessed at time of eval     Care Tool Bed Mobility Roll left and right activity   Roll left and right assist level: Minimal Assistance - Patient > 75%    Sit to lying activity   Sit to lying assist level: Moderate Assistance - Patient 50 - 74%    Lying to sitting edge of bed activity   Lying to sitting edge of bed assist level: Moderate Assistance -  Patient 50 - 74%     Care Tool Transfers Sit to stand transfer Sit to stand activity did not occur: Safety/medical concerns (due to weakness/fatigue) Sit to stand assist level:  (used Denna Haggard)    Chair/bed transfer Chair/bed transfer activity did not occur: Safety/medical concerns (due to weakness/fatigue)       Materials engineer transfer  activity did not occur: Safety/medical concerns (due to weakness/fatigue)       Care Tool Cognition Expression of Ideas and Wants Expression of Ideas and Wants: Without difficulty (complex and basic) - expresses complex messages without difficulty and with speech that is clear and easy to understand   Understanding Verbal and Non-Verbal Content Understanding Verbal and Non-Verbal Content: Understands (complex and basic) - clear comprehension without cues or repetitions   Memory/Recall Ability *first 3 days only Memory/Recall Ability *first 3 days only: Current season;Location of own room;That he or she is in a hospital/hospital unit    Refer to Care Plan for [place] 1 OT Short Term Goal 1 (Week 1): Pt will be able to tolerate standing in [place] stedy for at least 3 minutes to demonstrate improved endurance. OT Short Term Goal 2 (Week 1): Pt will be able to don and doff pants over hips using a bridge in bed from supine. OT Short Term Goal 3 (Week 1): Pt will be able to reach R arm back behind his hips to be able to adjust pants and increase potential for full cleansing. OT Short Term Goal 4 (Week 1): Pt will don shirt with min A.  Recommendations for other services: None    Skilled Therapeutic Intervention ADL ADL Eating: Set up Grooming: Setup Where Assessed-Grooming: Edge of bed Upper Body Bathing: Setup Where Assessed-Upper Body Bathing: Edge of bed Lower Body Bathing: Maximal assistance Where Assessed-Lower Body Bathing: Bed level Upper Body Dressing: Minimal assistance Where Assessed-Upper Body  Dressing: Edge of bed Lower Body Dressing: Maximal assistance Where Assessed-Lower Body Dressing: Bed level Mobility  Bed Mobility Bed Mobility: Rolling Right;Rolling Left;Supine to Sit;Sit to Supine Rolling Right: Contact Guard/Touching assist (bed rails) Rolling Left: Contact Guard/Touching assist (bed rails) Supine to Sit: Moderate Assistance - Patient 50-74% Sit to Supine: Moderate Assistance - Patient 50-74%   Pt seen for initial evaluation and ADL training with a focus on endurance and AROM. Reviewed OT purpose, POC, ELOS, and pt's goals.  Pt participated well and put in good effort.  He was able to roll onto his side with min A and with support through hip and upper back, he sat to EOB with mod A.  He tolerated sitting for 30 min to do UB self care.  He then worked on AROM of LEs with towel slides on the floor and shoulder ROM with arm circles.   He was agreeable to trying the Denna Haggard, he was familiar with the lift and helped this therapist set it up. He rose to stand with mechanical lift and tolerated standing for 45 seconds. He did start to get fatigued but was able to keep legs extended.  He sat back to EOB to moved to supine with mod A.  Worked on AROM with arms with full ROM.  LE bridges and knee sways, hip adduction squeezes with pillow.   Pt provided with super soft theraputty as he felt soft theraputty was too difficult to squeeze.  In bed with alarm set and all needs met.    Discharge Criteria: Patient will be discharged from OT if patient refuses treatment 3 consecutive times without medical reason, if treatment goals not met, if there is a change in medical status, if patient makes no progress towards goals or if patient is discharged from hospital.  The above assessment, treatment plan, treatment alternatives and goals were discussed and mutually agreed upon: by patient  [facility] 12/19/2020, 12:44 PM

## 2020-12-19 NOTE — Progress Notes (Signed)
Peg tube care done for pt. 155ml of sterile water flushed in peg tube with no complications, tube site clean, dry, and intact.   Dayna Ramus, LPN

## 2020-12-19 NOTE — Progress Notes (Signed)
Inpatient Rehabilitation  Patient information reviewed and entered into eRehab system by Dameon Soltis M. Annelisa Ryback, M.A., CCC/SLP, PPS Coordinator.  Information including medical coding, functional ability and quality indicators will be reviewed and updated through discharge.    

## 2020-12-19 NOTE — Progress Notes (Signed)
[place] PHYSICAL MEDICINE & REHABILITATION PROGRESS NOTE   Subjective/Complaints:   Pt reports PEG hadn't been flushed today- wondering can be-  Wants it out, but admits it "hasn't been long enough".   R shoulder, which he had injury in, in the past, feels loose due to muscle weakness.  Got trach out 2 days ago- still has stoma, which nursing is doing wound care for.   L knee and HA's can be painful- tylenol not enough frequently.     ROS:  Pt denies SOB, abd pain, CP, N/V/C/D, and vision changes   Objective:   No results found. Recent Labs    12/19/20 0510  WBC 6.6  HGB 9.5*  HCT 30.4*  PLT 202   Recent Labs    12/19/20 0510  NA 138  K 4.4  CL 103  CO2 26  GLUCOSE 94  BUN 33*  CREATININE 1.54*  CALCIUM 9.2    Intake/Output Summary (Last 24 hours) at 12/19/2020 1203 Last data filed at 12/19/2020 0813 Gross per 24 hour  Intake 396 ml  Output 1975 ml  Net -1579 ml     Pressure Injury 10/29/20 Sacrum Mid Deep Tissue Pressure Injury - Purple or maroon localized area of discolored intact skin or blood-filled blister due to damage of underlying soft tissue from pressure and/or shear. purple (Active)  10/29/20 1300  Location: Sacrum  Location Orientation: Mid  Staging: Deep Tissue Pressure Injury - Purple or maroon localized area of discolored intact skin or blood-filled blister due to damage of underlying soft tissue from pressure and/or shear.  Wound Description (Comments): purple  Present on Admission: No     Pressure Injury 11/18/20 Toe (Comment  which one) Anterior;Right Deep Tissue Pressure Injury - Purple or maroon localized area of discolored intact skin or blood-filled blister due to damage of underlying soft tissue from pressure and/or shear. DTI left  (Active)  11/18/20 2330  Location: Toe (Comment  which one) (LEFT big toe)  Location Orientation: Anterior;Right  Staging: Deep Tissue Pressure Injury - Purple or maroon localized area of  discolored intact skin or blood-filled blister due to damage of underlying soft tissue from pressure and/or shear.  Wound Description (Comments): DTI left toe  Present on Admission: Yes (on admit to 46M)    Physical Exam: Vital Signs Blood pressure 116/76, pulse 85, temperature 98.5 F (36.9 C), temperature source Oral, resp. rate 20, height 6' (1.829 m), weight 110 kg, SpO2 97 %.   Constitutional:  pt awake, laert, has beard, c/o pain, laying in bed, supine, NAD HENT: conjugate gaze; trach site C/D/I- stoma underneath is closing adequately Cardiovascular: RRR- no JVD Pulmonary: CTA B/L- no W/R/R- good air movement Abdominal: (+)PEG in place; Soft, NT, ND, (+)BS  Musculoskeletal:     Cervical back: Normal range of motion and neck supple.    R knee has mild to moderate effusion- no bruising R shoulder- 1+ finger subluxation  Skin:    General: Skin is warm and dry.     Comments: Sacrum discolored with evidence of dried MASD with few small abraded areas-- extends to perineum--see picture below. Right great toe and right 2nd toe with dried blisters. Bilateral plantar surface with dry flaky skin.   Neurological: Ox3 Motor: Right upper extremity: Shoulder abduction 3+/5, distally 3/5 Left upper extremity: 4 -/5 proximal distal Bilateral lower extremities: Hip flexion, knee extension 2+/5, ankle dorsiflexion 4/5 Sensation diminished to light touch distal to right elbow  Psychiatric:    appropriate  Assessment/Plan: 1. Functional deficits which require 3+ hours per day of interdisciplinary therapy in a comprehensive inpatient rehab setting.  Physiatrist is providing close team supervision and 24 hour management of active medical problems listed below.  Physiatrist and rehab team continue to assess barriers to discharge/monitor patient progress toward functional and medical goals  Care Tool:  Bathing              Bathing assist       Upper Body  Dressing/Undressing Upper body dressing   What is the patient wearing?: Hospital gown only    Upper body assist Assist Level: Minimal Assistance - Patient > 75%    Lower Body Dressing/Undressing Lower body dressing      What is the patient wearing?: Hospital gown only     Lower body assist Assist for lower body dressing: Minimal Assistance - Patient > 75%     Toileting Toileting    Toileting assist Assist for toileting: Moderate Assistance - Patient 50 - 74%     Transfers Chair/bed transfer  Transfers assist  Chair/bed transfer activity did not occur: Safety/medical concerns        Locomotion Ambulation   Ambulation assist              Walk 10 feet activity   Assist           Walk 50 feet activity   Assist           Walk 150 feet activity   Assist           Walk 10 feet on uneven surface  activity   Assist           Wheelchair     Assist               Wheelchair 50 feet with 2 turns activity    Assist            Wheelchair 150 feet activity     Assist          Blood pressure 116/76, pulse 85, temperature 98.5 F (36.9 C), temperature source Oral, resp. rate 20, height 6' (1.829 m), weight 110 kg, SpO2 97 %.    Medical Problem List and Plan: 1.  Diffuse weakness with decreased endurance, balance deficits and pain affecting mobility and ADLs secondary to CIP/CIM- I.e ICU myopathy/neuropathy             -patient may shower             -ELOS/Goals: 27-30 days/supervision/min a             Admit to CIR 2.  Antithrombotics: -DVT/anticoagulation:  Pharmaceutical: Lovenox             -antiplatelet therapy: N/A 3. Pain Management: tylenol prn  1/27- c/o pain in joints and HA's- will give tramadol 50 mg q6 hours prn 4. Mood: LCSW to follow for evaluation and support.              -antipsychotic agents: Seroquel 25 5. Neuropsych: This patient is capable of making decisions on his own behalf. 6.  Skin/Wound Care:                          DTI with MASD: areas on buttocks improving.  Zinc for moisture barrier as well as pressure relief measures.   1/27- will con't wound care and also PEG flushes- will order  7. Fluids/Electrolytes/Nutrition: Monitor I/O. Encourage fluid intake.  Continue to monitor lytes with serial checks.  8. Morbid obesity: Educate on importance of diet and weight loss to help promote overall health. Encourage side lying when in bed.  9. Pre-renal azotemia: Improving off tube feeds--encourage fluid intake.              CMP ordered for tomorrow.  1/27- Cr and BUN both bumped up significantly- will recheck in AM- if not better with pushing PO fluids, will need to give fluids via PEG 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              1/27- Hb 9.5- overall stable- con't regimen 11. Encephalopathy: Has resolved. Continue Seroquel to help with sleep hygeine.  12.  Dysphagia             D2 thins, advance diet as tolerated 13. Recent trach  1/27- still has stoma that is healing- con't wound care.   LOS: 1 days A FACE TO FACE EVALUATION WAS PERFORMED  Makenly Larabee 12/19/2020, 12:03 PM

## 2020-12-20 LAB — BASIC METABOLIC PANEL
Anion gap: 12 (ref 5–15)
BUN: 27 mg/dL — ABNORMAL HIGH (ref 6–20)
CO2: 25 mmol/L (ref 22–32)
Calcium: 9.6 mg/dL (ref 8.9–10.3)
Chloride: 102 mmol/L (ref 98–111)
Creatinine, Ser: 1.5 mg/dL — ABNORMAL HIGH (ref 0.61–1.24)
GFR, Estimated: 54 mL/min — ABNORMAL LOW (ref >60)
Glucose, Bld: 128 mg/dL — ABNORMAL HIGH (ref 70–99)
Potassium: 4.4 mmol/L (ref 3.5–5.1)
Sodium: 139 mmol/L (ref 135–145)

## 2020-12-20 MED ORDER — TRAZODONE HCL 50 MG PO TABS
50.0000 mg | ORAL_TABLET | Freq: Every day | ORAL | Status: DC
  Administered 2020-12-20 – 2021-01-23 (×35): 50 mg via ORAL
  Filled 2020-12-20 (×36): qty 1

## 2020-12-20 MED ORDER — SODIUM CHLORIDE 0.9 % IV SOLN: INTRAVENOUS | Status: AC

## 2020-12-20 MED ORDER — CHLORHEXIDINE GLUCONATE 0.12 % MT SOLN
OROMUCOSAL | Status: AC
  Filled 2020-12-20: qty 15

## 2020-12-20 MED ORDER — CHLORHEXIDINE GLUCONATE 0.12 % MT SOLN
15.0000 mL | Freq: Two times a day (BID) | OROMUCOSAL | Status: DC
  Administered 2020-12-21 – 2021-01-24 (×26): 15 mL via OROMUCOSAL
  Filled 2020-12-20 (×48): qty 15

## 2020-12-20 NOTE — Care Management (Addendum)
Inpatient [facility] Individual Statement of Services  Patient Name:  Jackson Figueroa  Date:  12/20/2020  Welcome to the [facility].  Our goal is to provide you with an individualized program based on your diagnosis and situation, designed to meet your specific needs.  With this comprehensive rehabilitation program, you will be expected to participate in at least 3 hours of rehabilitation therapies Monday-Friday, with modified therapy programming on the weekends.  Your rehabilitation program will include the following services:  Physical Therapy (PT), Occupational Therapy (OT), Speech Therapy (ST), 24 hour per day rehabilitation nursing, Therapeutic Recreaction (TR), Psychology, Neuropsychology, Care Coordinator, Rehabilitation Medicine, Nutrition Services, Pharmacy Services and Other  Weekly team conferences will be held on Tuesdays to discuss your progress.  Your Inpatient Rehabilitation Care Coordinator will talk with you frequently to get your input and to update you on team discussions.  Team conferences with you and your family in attendance may also be held.  Expected length of stay: 28-30 days  Overall anticipated outcome: Contact [place]  Depending on your progress and recovery, your program may change. Your Inpatient Rehabilitation Care Coordinator will coordinate services and will keep you informed of any changes. Your Inpatient Rehabilitation Care Coordinator's name and contact numbers are listed  below.  The following services may also be recommended but are not provided by the [facility] will be made to provide these services after discharge if needed.  Arrangements include referral to agencies that provide these services.  Your insurance has been verified to be:  [place]  Your primary doctor  is:  No PCP  Pertinent information will be shared with your doctor and your insurance company.  Inpatient Rehabilitation Care Coordinator:  Cathleen Corti [phone] or ([phone]  Information discussed with and copy given to patient by: Rana Snare, 12/20/2020, 11:58 AM

## 2020-12-20 NOTE — Evaluation (Addendum)
Speech Language Pathology Assessment and Plan  Patient Details  Name: Jackson Figueroa MRN: [id number] Date of Birth: 1961/08/15  SLP Diagnosis: Dysphagia  Rehab Potential: Excellent ELOS: 28-30 days, likely shorter LOS for SLP    Today's Date: 12/20/2020 SLP Individual Time: 0805-0900 SLP Individual Time Calculation (min): 55 min   Hospital Problem: Principal Problem:   Critical illness polyneuropathy (HCC) Active Problems:   Debility  Past Medical History:  Past Medical History:  Diagnosis Date  . Allergic   . Knee pain, bilateral    due to old injuires  . Numbness and tingling in right hand    and elbow due to sports injury  . Obesity   . Staph infection    of appy wound--treated with antibiotics X 4 weeks.   . Tendinitis of elbow    right   Past Surgical History:  Past Surgical History:  Procedure Laterality Date  . APPENDECTOMY  1982  . INGUINAL HERNIA REPAIR Left 2000  . IR GASTROSTOMY TUBE MOD SED  11/21/2020  . KNEE ARTHROSCOPY     bilateral knees  . TONSILLECTOMY AND ADENOIDECTOMY    . TRACHEOSTOMY TUBE PLACEMENT N/A 11/14/2020   Procedure: TRACHEOSTOMY;  Surgeon: Christia Reading, MD;  Location: [facility];  Service: ENT;  Laterality: N/A;    Assessment / Plan / Recommendation Clinical Impression   Jackson Figueroa is a 60 year old male with h/o morbid obesity who was originally admitted with Covid PNA with septic shock on 10/12/2020 and treated with Covid protocol.  History from chart review and patient--has not been vaccinated against Covid.   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  He required tracheostomy on 11/14/2020 by Dr. Jenne Pane and PEG placed on 11/21/2020 by Dr. Archer Asa.  Palliative care consulted to discuss goals of care and family elected on full scope of care.   He tolerated extubation to ATC, was weaned to room air on 12/09/2020.  He tolerated plugging and was decannulated on 12/17/2020.  Swallow function has improved and he has been advanced to dysphagia #2, thin liquids with use of aspiration precautions. 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 Therapy ongoing and working on endurance with sitting in a chair for an hour as well as trials in [name] to attempt weight bearing on BLE for 90 seconds. CIR recommended due to functional decline and severe deconditioning.  Please see preadmission assessment from earlier today as well.  SLP evaluation was completed on 12/20/2020 with results as follows:  Pt presents with good toleration of all consistencies assessed including thin liquids, purees, and regular texture solids.  His oral phase is timely and efficient for clearing boluses from the oral cavity without leaving residue post swallow.  He demonstrates no overt s/s of aspiration with solids or liquids and reports he feels he can tolerate advancement from dys 3 textures to regular textures at this time.  As a result, I recommend advancing pt's diet to regular textures and thin liquids with intermittent supervision/tray set up to accommodate for motor weakness in upper extremities.    Pt has been decannulated and his voice is strong and clear.  He was fully intelligible and fluent throughout today's evaluation with no  evidence of dysphonia, dysarthria, or word finding difficulty.  Pt denies any current challenges with functional communication and he reports that his cognition is back to baseline as well.   Pt would benefit from brief ST follow up while inpatient in order to assess toleration of today's diet upgrade but I do not anticipate any needs post  CIR stay.       Skilled Therapeutic Interventions          Cognitive-linguistic and bedside swallow evaluation completed with results and recommendations reviewed with patient.    SLP Assessment  Patient will need skilled Speech Lanaguage Pathology Services during CIR admission    Recommendations  SLP Diet Recommendations: Age appropriate regular solids;Thin Liquid Administration via: Cup;Straw Medication Administration: Whole meds with liquid Supervision: Patient able to self feed Compensations: Slow rate;Small sips/bites;Effortful swallow;Follow solids with liquid Postural Changes and/or Swallow Maneuvers: Seated upright 90 degrees Oral Care Recommendations: Oral care BID Patient destination: Home Follow up Recommendations: None Equipment Recommended: None recommended by SLP    SLP Frequency 1 to 3 out of 7 days   SLP Duration  SLP Intensity  SLP Treatment/Interventions 28-30 days, likely shorter LOS for SLP  Minumum of 1-2 x/day, 30 to 90 minutes  Cueing hierarchy;Dysphagia/aspiration precaution training;Internal/external aids    Pain Pain Assessment Pain Scale: 0-10 Pain Score: 5  Pain Location: Buttocks Pain Orientation: Right Pain Descriptors / Indicators: Sore Pain Intervention(s): RN made aware  Prior Functioning Cognitive/Linguistic Baseline: Within functional limits Type of Home: House  Lives With: Significant other Available Help at Discharge: Family;Available 24 hours/day Vocation: Full time employment  SLP Evaluation Cognition Overall Cognitive Status: Within Functional Limits for tasks assessed  Comprehension Auditory Comprehension Overall Auditory Comprehension: Appears within functional limits for tasks assessed Expression Expression Primary Mode of Expression: Verbal Verbal Expression Overall Verbal Expression: Appears within functional limits for tasks assessed Oral Motor Oral Motor/Sensory Function Overall Oral Motor/Sensory Function:  Within functional limits Motor Speech Overall Motor Speech: Appears within functional limits for tasks assessed  Care Tool Care Tool Cognition Expression of Ideas and Wants Expression of Ideas and Wants: Without difficulty (complex and basic) - expresses complex messages without difficulty and with speech that is clear and easy to understand   Understanding Verbal and Non-Verbal Content Understanding Verbal and Non-Verbal Content: Understands (complex and basic) - clear comprehension without cues or repetitions   Memory/Recall Ability *first 3 days only Memory/Recall Ability *first 3 days only: Current season;Location of own room;That he or she is in a hospital/hospital unit     Bedside Swallowing Assessment General Date of Onset: 10/21/20 Previous Swallow Assessment: BSE Diet Prior to this Study: Dysphagia 3 (soft);Thin liquids Temperature Spikes Noted: No Respiratory Status: Room air History of Recent Intubation: Yes Length of Intubations (days): 24 days Behavior/Cognition: Alert;Cooperative;Pleasant mood Oral Cavity - Dentition: Adequate natural dentition Self-Feeding Abilities: Needs set up;Needs assist Vision: Functional for self-feeding Patient Positioning: Upright in bed Baseline Vocal Quality: Normal Volitional Cough: Strong Volitional Swallow: Able to elicit  Oral Care Assessment   Ice Chips   Thin Liquid Thin Liquid: Within functional limits Nectar Thick   Honey Thick   Puree Puree: Within functional limits Solid Solid: Within functional limits BSE Assessment Risk for Aspiration Impact on safety and function: Mild aspiration risk Other Related Risk Factors: Prolonged intubation;Deconditioning;Decreased respiratory status  Short Term Goals: Week 1: SLP Short Term Goal 1 (Week 1): Pt will consume regular textures and thin liquids with mod I use of swallowing precautions and no overt  s/s of aspiration over 2 consecutive sessions.  Refer to Care Plan for  Long Term Goals  Recommendations for other services: None   Discharge Criteria: Patient will be discharged from SLP if patient refuses treatment 3 consecutive times without medical reason, if treatment goals not met, if there is a change in medical status, if patient makes no progress towards goals or if patient is discharged from hospital.  The above assessment, treatment plan, treatment alternatives and goals were discussed and mutually agreed upon: by patient  Emilio Math 12/20/2020, 12:32 PM

## 2020-12-20 NOTE — Progress Notes (Signed)
Occupational Therapy Session Note  Patient Details  Name: Jackson Figueroa MRN: [id number] Date of Birth: [birth year]  Today's Date: 12/20/2020 OT Individual Time: 8242-3536 OT Individual Time Calculation (min): 68 min    Short Term Goals: Week 1:  OT Short Term Goal 1 (Week 1): Pt will be able to tolerate standing in [place] stedy for at least 3 minutes to demonstrate improved endurance. OT Short Term Goal 2 (Week 1): Pt will be able to don and doff pants over hips using a bridge in bed from supine. OT Short Term Goal 3 (Week 1): Pt will be able to reach R arm back behind his hips to be able to adjust pants and increase potential for full cleansing. OT Short Term Goal 4 (Week 1): Pt will don shirt with min A.  Skilled Therapeutic Interventions/Progress Updates:    OT intervention with focus on bed mobility, sit<>stand with Denna Haggard, [facility] tranfser with Denna Haggard, standing tolerance, sitting balance/tolerance, and BUE therex. Supine>sit EOB with supervision. Sitting balance with supervision using UE support. Pt able to sit without UE support but states he can "feel it" in his back. Pt assists with setting up Denna Haggard. Dependent for toileting and clothing management. BUE therex with orange theraband-straight arm pulls 4x10. Attempted diagonals but limited shoulder AROM prohibited. Pt requires multiple extended rest breaks during session secondary to fatigue. Pt remained in bed with all needs within reach and bed alarm activated.   Therapy Documentation Precautions:  Precautions Precautions: Fall Precaution Comments: PEG, sacral and toe wounds Restrictions Weight Bearing Restrictions: No   Pain: Pain Assessment Pain Scale: 0-10 Pain Score: 5  Pain Type: Acute pain Pain Location: Buttocks Pain Orientation: Right Pain Descriptors / Indicators: Sore Pain Intervention(s): RN made aware and repositioned    Therapy/Group: Individual Therapy  [name] 12/20/2020, 10:52 AM

## 2020-12-20 NOTE — IPOC Note (Signed)
Overall Plan of Care [facility]) Patient Details Name: Jackson Figueroa MRN: [id number] DOB: [birth year]  Admitting Diagnosis: Critical illness polyneuropathy [facility])  Hospital Problems: Principal Problem:   Critical illness polyneuropathy ([facility]) Active Problems:   Debility     Functional Problem List: Nursing Edema,Skin Integrity,Sensory  PT Balance,Endurance,Motor,Pain,Safety,Sensory,Skin Integrity  OT Balance,Endurance,Motor,Sensory,Pain  SLP    TR         Basic ADL's: OT Eating,Grooming,Bathing,Dressing,Toileting     Advanced  ADL's: OT       Transfers: PT Bed Mobility,Bed to Chair,Car,Furniture  OT Toilet,Tub/Shower     Locomotion: PT Ambulation,Wheelchair Mobility,Stairs     Additional Impairments: OT Fuctional Use of Upper Extremity  SLP Swallowing      TR      Anticipated Outcomes Item Anticipated Outcome  Self Feeding independent  Swallowing  mod I   Basic self-care  CGA  Toileting  CGA   Bathroom Transfers CGA  Bowel/Bladder  remain continent of bowel and bladder  Transfers  CGA  Locomotion  CGA  Communication     Cognition     Pain  less than 3  Safety/Judgment  remain free of falls, skin breakdown and infection   Therapy Plan: PT Intensity: Minimum of 1-2 x/day ,45 to 90 minutes PT Frequency: 5 out of 7 days PT Duration Estimated Length of Stay: 4 weeks OT Intensity: Minimum of 1-2 x/day, 45 to 90 minutes OT Frequency: 5 out of 7 days OT Duration/Estimated Length of Stay: 28-30 days SLP Intensity: Minumum of 1-2 x/day, 30 to 90 minutes SLP Frequency: 1 to 3 out of 7 days SLP Duration/Estimated Length of Stay: 28-30 days, likely shorter LOS for SLP   Due to the current state of emergency, patients may not be receiving their 3-hours of Medicare-mandated therapy.   Team Interventions: Nursing Interventions Patient/Family Education,Dysphagia/Aspiration Precaution Training,Medication Management,Skin Care/Wound [employer] Support   PT interventions Ambulation/gait training,Discharge planning,Functional mobility training,Psychosocial support,Therapeutic Activities,Visual/perceptual remediation/compensation,Balance/vestibular training,Disease management/prevention,Neuromuscular re-education,Skin care/wound [employer] manager propulsion/positioning,Cognitive remediation/compensation,DME/adaptive equipment instruction,Pain management,Splinting/orthotics,UE/LE Strength taining/ROM,Community reintegration,Functional electrical stimulation,Patient/family education,Stair training,UE/LE Coordination activities  OT Interventions Balance/vestibular training,Discharge planning,DME/adaptive equipment instruction,Functional mobility training,Pain management,Patient/family education,Self Care/advanced ADL retraining,Therapeutic Exercise,UE/LE Strength taining/ROM,Therapeutic Activities,UE/LE Coordination activities  SLP Interventions Cueing hierarchy,Dysphagia/aspiration precaution training,Internal/external aids  TR Interventions    SW/CM Interventions Discharge Planning,Psychosocial Support,Patient/Family Education   Barriers to Discharge MD  Medical stability, Home enviroment access/loayout, Incontinence, Lack of/limited family support, Weight, Medication compliance, Behavior and New oxygen  Nursing Inaccessible home environment,Decreased caregiver support,Home environment access/layout,Trach,Wound Care,Lack of/limited family support,Weight bearing restrictions,Medication compliance,Behavior,Nutrition means    PT Inaccessible home environment,Decreased caregiver support,Home environment access/layout,Wound Care    OT Inaccessible home environment,Home environment access/layout full flight of stairs to enter home (front and back)  SLP      SW Decreased caregiver support,Lack of/limited family support     Team Discharge Planning: Destination: PT-Home ,OT- Home , SLP-Home Projected Follow-up: PT-Home health  PT,Outpatient PT, OT-  Home health OT, SLP-None Projected Equipment Needs: PT-To be determined, OT- To be determined, SLP-None recommended by SLP Equipment Details: PT- , OT-  Patient/family involved in discharge planning: PT- Patient,  OT-Patient, SLP-Patient  MD ELOS: 4 weeks or so Medical Rehab Prognosis:  Fair Assessment: Pt is a 60 yr old male with 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 BMI 33 BUN/Cr elevate-d getting IVFs; Dysphagia D2 thins; and recent trach removal- 3 days ago- still healing and PEG placement.   Goals CGA by  d/c, hopefully-     See Team Conference Notes for weekly updates to the plan of care

## 2020-12-20 NOTE — Progress Notes (Signed)
[facility] PHYSICAL MEDICINE & REHABILITATION PROGRESS NOTE   Subjective/Complaints:   Pt reports got wet from trying to void in urinal- spilled on bed.  Needs pain meds- hasn't tried the tramadol yet- encouraged him to take.  Also having restless nights- will schedule trazodone per pt request.    ROS: Pt denies SOB, abd pain, CP, N/V/C/D, and vision changes    Objective:   No results found. Recent Labs    12/19/20 0510  WBC 6.6  HGB 9.5*  HCT 30.4*  PLT 202   Recent Labs    12/19/20 0510 12/20/20 0815  NA 138 139  K 4.4 4.4  CL 103 102  CO2 26 25  GLUCOSE 94 128*  BUN 33* 27*  CREATININE 1.54* 1.50*  CALCIUM 9.2 9.6    Intake/Output Summary (Last 24 hours) at 12/20/2020 [id number] Last data filed at 12/20/2020 0900 Gross per 24 hour  Intake 200 ml  Output 1430 ml  Net -1230 ml     Pressure Injury 11/18/20 Toe (Comment  which one) Anterior;Right Deep Tissue Pressure Injury - Purple or maroon localized area of discolored intact skin or blood-filled blister due to damage of underlying soft tissue from pressure and/or shear. DTI left  (Active)  11/18/20 2330  Location: Toe (Comment  which one) (LEFT big toe)  Location Orientation: Anterior;Right  Staging: Deep Tissue Pressure Injury - Purple or maroon localized area of discolored intact skin or blood-filled blister due to damage of underlying soft tissue from pressure and/or shear.  Wound Description (Comments): DTI left toe  Present on Admission: Yes (on admit to 53M)    Physical Exam: Vital Signs Blood pressure 115/81, pulse 88, temperature 98.3 F (36.8 C), temperature source Oral, resp. rate 14, height 6' (1.829 m), weight 110 kg, SpO2 96 %.   Constitutional:  pt awake, alert, lying in bed- wet- asking for NT help, NAD HENT: conjugate gaze; trach site C/D/I- stoma underneath is closing adequately- not assessed today Cardiovascular: RRR- no JVD Pulmonary: CTA B/L- no W/R/R- good air movement Abdominal:  (+)PEG in place; Soft, NT, ND, (+)BS  Musculoskeletal:     Cervical back: Normal range of motion and neck supple.    R knee has mild to moderate effusion- no bruising R shoulder- 1+ finger subluxation  Skin:    General: Skin is warm and dry.     Comments: Sacrum discolored with evidence of dried MASD with few small abraded areas-- extends to perineum--see picture below. Right great toe and right 2nd toe with dried blisters. Bilateral plantar surface with dry flaky skin.   Neurological: Ox3 Motor: Right upper extremity: Shoulder abduction 3+/5, distally 3/5 Left upper extremity: 4 -/5 proximal distal Bilateral lower extremities: Hip flexion, knee extension 2+/5, ankle dorsiflexion 4/5 Sensation diminished to light touch distal to right elbow  Psychiatric:   appropriate, somewhat flat affect       Assessment/Plan: 1. Functional deficits which require 3+ hours per day of interdisciplinary therapy in a comprehensive inpatient rehab setting.  Physiatrist is providing close team supervision and 24 hour management of active medical problems listed below.  Physiatrist and rehab team continue to assess barriers to discharge/monitor patient progress toward functional and medical goals  Care Tool:  Bathing    Body parts bathed by patient: Right arm,Left arm,Chest,Abdomen,Face   Body parts bathed by helper: Front perineal area,Buttocks,Right upper leg,Left upper leg,Right lower leg,Left lower leg     Bathing assist Assist Level: Moderate Assistance - Patient 50 - 74%  Upper Body Dressing/Undressing Upper body dressing   What is the patient wearing?: Pull over shirt    Upper body assist Assist Level: Minimal Assistance - Patient > 75%    Lower Body Dressing/Undressing Lower body dressing      What is the patient wearing?: Pants     Lower body assist Assist for lower body dressing: Maximal Assistance - Patient 25 - 49%     Toileting Toileting    Toileting assist  Assist for toileting: Dependent - Patient 0%     Transfers Chair/bed transfer  Transfers assist  Chair/bed transfer activity did not occur: Safety/medical concerns (due to weakness/fatigue)        Locomotion Ambulation   Ambulation assist   Ambulation activity did not occur: Safety/medical concerns (due to weakness/fatigue)          Walk 10 feet activity   Assist  Walk 10 feet activity did not occur: Safety/medical concerns (due to weakness/fatigue)        Walk 50 feet activity   Assist Walk 50 feet with 2 turns activity did not occur: Safety/medical concerns (due to weakness/fatigue)         Walk 150 feet activity   Assist Walk 150 feet activity did not occur: Safety/medical concerns (due to weakness/fatigue)         Walk 10 feet on uneven surface  activity   Assist Walk 10 feet on uneven surfaces activity did not occur: Safety/medical concerns (due to weakness/fatigue)         Wheelchair     Assist Will patient use wheelchair at discharge?: Yes Type of Wheelchair: Manual Wheelchair activity did not occur: Safety/medical concerns (due to weakness/fatigue)         Wheelchair 50 feet with 2 turns activity    Assist    Wheelchair 50 feet with 2 turns activity did not occur: Safety/medical concerns (due to weakness/fatigue)       Wheelchair 150 feet activity     Assist  Wheelchair 150 feet activity did not occur: Safety/medical concerns (due to weakness/fatigue)       Blood pressure 115/81, pulse 88, temperature 98.3 F (36.8 C), temperature source Oral, resp. rate 14, height 6' (1.829 m), weight 110 kg, SpO2 96 %.    Medical Problem List and Plan: 1.  Diffuse weakness with decreased endurance, balance deficits and pain affecting mobility and ADLs secondary to CIP/CIM- I.e ICU myopathy/neuropathy             -patient may shower             -ELOS/Goals: 27-30 days/supervision/min a             Admit to CIR 2.   Antithrombotics: -DVT/anticoagulation:  Pharmaceutical: Lovenox             -antiplatelet therapy: N/A 3. Pain Management: tylenol prn  1/27- c/o pain in joints and HA's- will give tramadol 50 mg q6 hours prn  1/28- pt hadn't taken- asked nursing to give this AM 4. Mood: LCSW to follow for evaluation and support.              -antipsychotic agents: Seroquel 25 5. Neuropsych: This patient is capable of making decisions on his own behalf. 6. Skin/Wound Care:                          DTI with MASD: areas on buttocks improving.  Zinc for moisture barrier as well as pressure relief  measures.   1/27- will con't wound care and also PEG flushes- will order   1/28- asked nurse to assess /d/w OT/PT to see if can do speciality bed 7. Fluids/Electrolytes/Nutrition: Monitor I/O. Encourage fluid intake. Continue to monitor lytes with serial checks.  8. Morbid obesity: Educate on importance of diet and weight loss to help promote overall health. Encourage side lying when in bed.  9. Pre-renal azotemia: Improving off tube feeds--encourage fluid intake.              CMP ordered for tomorrow.  1/27- Cr and BUN both bumped up significantly- will recheck in AM- if not better with pushing PO fluids, will need to give fluids via PEG  1/28- actually ordered IVFs 75cc/hr x 12 hours after therapy and recheck BMP in AM- BUN still 27 and Cr 1.5- no sig. Improvement with PO fluids.  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              1/27- Hb 9.5- overall stable- con't regimen 11. Encephalopathy: Has resolved. Continue Seroquel to help with sleep hygeine.  12.  Dysphagia             D2 thins, advance diet as tolerated  1/28- also has PEG_ he wants it removed, but don't think it's time yet- also not drinking enough- might need free water flushes? 13. Recent trach  1/27- still has stoma that is healing- con't wound care.   LOS: 2 days A FACE TO FACE EVALUATION WAS PERFORMED  Abhijot Straughter 12/20/2020, 9:24 AM

## 2020-12-20 NOTE — Progress Notes (Signed)
Physical Therapy Session Note  Patient Details  Name: Jackson Figueroa MRN: [id number] Date of Birth: May 01, 1961  Today's Date: 12/20/2020 PT Individual Time: 1105-1200 PT Individual Time Calculation (min): 55 min   Short Term Goals: Week 1:  PT Short Term Goal 1 (Week 1): Patient will be able to transition sit <> supine with CGA PT Short Term Goal 2 (Week 1): Patient will be able to transfer with LRAD and MaxA PT Short Term Goal 3 (Week 1): Patient will tolerate standing with LRAD for >1 min  Skilled Therapeutic Interventions/Progress Updates: Pt presented in bed agreeable to therapy. Pt denies pain at start of session. Pt performed supine to sit at EOB with modA and heavy use of bed features. Pt participated in seated EOB activities including ankle pumps, LAQ, and AA hip flexion ~x 20 bilaterally. Pt discussed wanting to eat sitting up as has been upgraded in diet. Explained that current not permissible to eat EOB but can try w/c today to which pt was agreeable. Pt returned to supine with minA and with PTA supporting B feet was able to boost self up to [facility]. Performed supine therex as follows bilaterally, hip abd/add x 10, SAQ x 10, heel slides with manual leg press x 10, AA SLR x 10. After brief rest pt returned to EOB in same manner as prior to set up for transfer via Denna Haggard. Pt transferred to w/c with Roho cushion placed and pt stating no immediate discomfort. Pt left in w/c at end of session with call bell within reach and nsg notified to use [name] to transfer pt back to bed after lunch.      Therapy Documentation Precautions:  Precautions Precautions: Fall Precaution Comments: PEG, sacral and toe wounds Restrictions Weight Bearing Restrictions: No General:   Vital Signs: Therapy Vitals Temp: 98.4 F (36.9 C) Temp Source: Oral Pulse Rate: 89 Resp: 16 BP: 103/69 Patient Position (if appropriate): Lying Oxygen Therapy SpO2: 97 % O2 Device: Room Air  Therapy/Group: Individual  Therapy  Adelaide Pfefferkorn  Cynithia Hakimi, PTA  12/20/2020, 4:16 PM

## 2020-12-20 NOTE — Progress Notes (Signed)
Patient ID: Jackson Figueroa, male   DOB: [birth year], 60 y.o.   MRN: [id number]  SW spoke with pt s/o [name] [phone]) to introduce self, explain role, and discuss discharge process. SW shared ELOS 28-30. She confirms she will be primary caregiver and has her own physical limitations in which she is unable to lift. She also expressed pt concern related to if his right hand is permanently damaged since pt has a desire to return to work. SW will relay this concern to medical team. SW shared opportunity family edu once d/c date established and she can speak with therapists on strategies that she can use to reduce strain on her back. SW to follow-up with updates after team conference.   Loralee Pacas, MSW, [place] Office: [phone] Cell: [phone] Fax: [phone]

## 2020-12-20 NOTE — Plan of Care (Signed)
  Problem: RH SKIN INTEGRITY Goal: RH STG SKIN FREE OF INFECTION/BREAKDOWN Description: Skin free of breakdown and infection with min assist to supervision assistance. Outcome: Progressing Goal: RH STG ABLE TO PERFORM INCISION/WOUND CARE W/ASSISTANCE Description: STG Able To Perform Incision/Wound Care With min Assistance to supervision assistance. Outcome: Progressing   Problem: RH KNOWLEDGE DEFICIT GENERAL Goal: RH STG INCREASE KNOWLEDGE OF SELF CARE AFTER HOSPITALIZATION Outcome: Progressing   Problem: Consults Goal: RH GENERAL PATIENT EDUCATION Description: See Patient Education module for education specifics. Outcome: Progressing Goal: Skin Care Protocol Initiated - if Braden Score 18 or less Description: If consults are not indicated, leave blank or document N/A Outcome: Progressing   

## 2020-12-21 LAB — BASIC METABOLIC PANEL
Anion gap: 9 (ref 5–15)
BUN: 26 mg/dL — ABNORMAL HIGH (ref 6–20)
CO2: 24 mmol/L (ref 22–32)
Calcium: 8.9 mg/dL (ref 8.9–10.3)
Chloride: 105 mmol/L (ref 98–111)
Creatinine, Ser: 1.37 mg/dL — ABNORMAL HIGH (ref 0.61–1.24)
GFR, Estimated: >60 mL/min (ref >60)
Glucose, Bld: 92 mg/dL (ref 70–99)
Potassium: 5 mmol/L (ref 3.5–5.1)
Sodium: 138 mmol/L (ref 135–145)

## 2020-12-21 MED ORDER — HYDROCERIN EX CREA
TOPICAL_CREAM | Freq: Every day | CUTANEOUS | Status: DC
  Filled 2020-12-21 (×2): qty 113

## 2020-12-21 NOTE — Plan of Care (Signed)
  Problem: RH SKIN INTEGRITY Goal: RH STG SKIN FREE OF INFECTION/BREAKDOWN Description: Skin free of breakdown and infection with min assist to supervision assistance. Outcome: Progressing Goal: RH STG ABLE TO PERFORM INCISION/WOUND CARE W/ASSISTANCE Description: STG Able To Perform Incision/Wound Care With min Assistance to supervision assistance. Outcome: Progressing   Problem: RH KNOWLEDGE DEFICIT GENERAL Goal: RH STG INCREASE KNOWLEDGE OF SELF CARE AFTER HOSPITALIZATION Outcome: Progressing   Problem: Consults Goal: RH GENERAL PATIENT EDUCATION Description: See Patient Education module for education specifics. Outcome: Progressing Goal: Skin Care Protocol Initiated - if Braden Score 18 or less Description: If consults are not indicated, leave blank or document N/A Outcome: Progressing   

## 2020-12-21 NOTE — Progress Notes (Signed)
Jackson Figueroa PHYSICAL MEDICINE & REHABILITATION PROGRESS NOTE   Subjective/Complaints:  No issues overnight.  Patient asking about right hand weakness as well as numbness in the little finger.  He noticed a little bit of this even prior to hospitalization but it is much worse now   ROS: Pt denies SOB, abd pain, CP, N/V/C/D, and vision changes    Objective:   No results found. Recent Labs    12/19/20 0510  WBC 6.6  HGB 9.5*  HCT 30.4*  PLT 202   Recent Labs    12/20/20 0815 12/21/20 0556  NA 139 138  K 4.4 5.0  CL 102 105  CO2 25 24  GLUCOSE 128* 92  BUN 27* 26*  CREATININE 1.50* 1.37*  CALCIUM 9.6 8.9    Intake/Output Summary (Last 24 hours) at 12/21/2020 1226 Last data filed at 12/21/2020 1031 Gross per 24 hour  Intake 397.99 ml  Output 2570 ml  Net -2172.01 ml     Pressure Injury 11/18/20 Toe (Comment  which one) Anterior;Right Deep Tissue Pressure Injury - Purple or maroon localized area of discolored intact skin or blood-filled blister due to damage of underlying soft tissue from pressure and/or shear. DTI left  (Active)  11/18/20 2330  Location: Toe (Comment  which one) (LEFT big toe)  Location Orientation: Anterior;Right  Staging: Deep Tissue Pressure Injury - Purple or maroon localized area of discolored intact skin or blood-filled blister due to damage of underlying soft tissue from pressure and/or shear.  Wound Description (Comments): DTI left toe  Present on Admission: Yes (on admit to 52M)    Physical Exam: Vital Signs Blood pressure 112/71, pulse 80, temperature 98.4 F (36.9 C), temperature source Oral, resp. rate 16, height 6' (1.829 m), weight 110 kg, SpO2 96 %.   Skin:    General: Skin is warm and dry.     Comments: Sacrum discolored with evidence of dried MASD with few small abraded areas-- extends to perineum--see picture below. Right great toe and right 2nd toe with dried blisters. Bilateral plantar surface with dry flaky skin.    Neurological: Ox3 Motor: Right upper extremity: Shoulder abduction 3+/5, distally 3/5 Left upper extremity: 4 -/5 proximal distal Bilateral lower extremities: Hip flexion, knee extension 2+/5, ankle dorsiflexion 4/5  Reduced sensation fourth and fifth digits right hand Right hand intrinsic atrophy  Psychiatric:   appropriate, somewhat flat affect       Assessment/Plan: 1. Functional deficits which require 3+ hours per day of interdisciplinary therapy in a comprehensive inpatient rehab setting.  Physiatrist is providing close team supervision and 24 hour management of active medical problems listed below.  Physiatrist and rehab team continue to assess barriers to discharge/monitor patient progress toward functional and medical goals  Care Tool:  Bathing    Body parts bathed by patient: Right arm,Left arm,Chest,Abdomen,Face   Body parts bathed by helper: Front perineal area,Buttocks,Right upper leg,Left upper leg,Right lower leg,Left lower leg     Bathing assist Assist Level: Moderate Assistance - Patient 50 - 74%     Upper Body Dressing/Undressing Upper body dressing   What is the patient wearing?: Hospital gown only    Upper body assist Assist Level: Minimal Assistance - Patient > 75%    Lower Body Dressing/Undressing Lower body dressing      What is the patient wearing?: Pants     Lower body assist Assist for lower body dressing: Maximal Assistance - Patient 25 - 49%     Toileting Toileting  Toileting assist Assist for toileting: Independent with assistive device Assistive Device Comment: Urinal   Transfers Chair/bed transfer  Transfers assist  Chair/bed transfer activity did not occur: Safety/medical concerns (due to weakness/fatigue)  Chair/bed transfer assist level:  (sara lift)     Locomotion Ambulation   Ambulation assist   Ambulation activity did not occur: Safety/medical concerns (due to weakness/fatigue)          Walk 10  feet activity   Assist  Walk 10 feet activity did not occur: Safety/medical concerns (due to weakness/fatigue)        Walk 50 feet activity   Assist Walk 50 feet with 2 turns activity did not occur: Safety/medical concerns (due to weakness/fatigue)         Walk 150 feet activity   Assist Walk 150 feet activity did not occur: Safety/medical concerns (due to weakness/fatigue)         Walk 10 feet on uneven surface  activity   Assist Walk 10 feet on uneven surfaces activity did not occur: Safety/medical concerns (due to weakness/fatigue)         Wheelchair     Assist Will patient use wheelchair at discharge?: Yes Type of Wheelchair: Manual Wheelchair activity did not occur: Safety/medical concerns (due to weakness/fatigue)         Wheelchair 50 feet with 2 turns activity    Assist    Wheelchair 50 feet with 2 turns activity did not occur: Safety/medical concerns (due to weakness/fatigue)       Wheelchair 150 feet activity     Assist  Wheelchair 150 feet activity did not occur: Safety/medical concerns (due to weakness/fatigue)       Blood pressure 112/71, pulse 80, temperature 98.4 F (36.9 C), temperature source Oral, resp. rate 16, height 6' (1.829 m), weight 110 kg, SpO2 96 %.    Medical Problem List and Plan: 1.  Diffuse weakness with decreased endurance, balance deficits and pain affecting mobility and ADLs secondary to CIP/CIM- I.e ICU myopathy/neuropathy             -patient may shower             -ELOS/Goals: 27-30 days/supervision/min a             Admit to CIR 2.  Antithrombotics: -DVT/anticoagulation:  Pharmaceutical: Lovenox             -antiplatelet therapy: N/A 3. Pain Management: tylenol prn  1/27- c/o pain in joints and HA's- will give tramadol 50 mg q6 hours prn  1/28- pt hadn't taken- asked nursing to give this AM 4. Mood: LCSW to follow for evaluation and support.              -antipsychotic agents: Seroquel 25 5.  Neuropsych: This patient is capable of making decisions on his own behalf. 6. Skin/Wound Care:                          DTI with MASD: areas on buttocks improving.  Zinc for moisture barrier as well as pressure relief measures.   1/27- will con't wound care and also PEG flushes- will order   1/28- asked nurse to assess /d/w OT/PT to see if can do speciality bed 7. Fluids/Electrolytes/Nutrition: Monitor I/O. Encourage fluid intake. Continue to monitor lytes with serial checks.  8. Morbid obesity: Educate on importance of diet and weight loss to help promote overall health. Encourage side lying when in bed.  9. Pre-renal azotemia:  Improving off tube feeds--encourage fluid intake.              CMP ordered for tomorrow.  1/27- Cr and BUN both bumped up significantly- will recheck in AM- if not better with pushing PO fluids, will need to give fluids via PEG  1/28- actually ordered IVFs 75cc/hr x 12 hours after therapy and recheck BMP in AM- BUN still 27 and Cr 1.5- no sig. Improvement with PO fluids.  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              1/27- Hb 9.5- overall stable- con't regimen 11. Encephalopathy: Has resolved. Continue Seroquel to help with sleep hygeine.  12.  Dysphagia             D2 thins, advance diet as tolerated  1/28- also has PEG_ he wants it removed, but don't think it's time yet- also not drinking enough- might need free water flushes? 13. Recent trach  1/27- still has stoma that is healing- con't wound care.   14.  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 LOS: 3 days A FACE TO FACE EVALUATION WAS PERFORMED  Charlett Blake 12/21/2020, 12:26 PM

## 2020-12-22 NOTE — Progress Notes (Signed)
Occupational Therapy Session Note  Patient Details  Name: Jackson Figueroa MRN: [id number] Date of Birth: 1961/03/21  Today's Date: 12/22/2020 OT Individual Time: 7829-5621 OT Individual Time Calculation (min): 70 min    Short Term Goals: Week 1:  OT Short Term Goal 1 (Week 1): Pt will be able to tolerate standing in [place] stedy for at least 3 minutes to demonstrate improved endurance. OT Short Term Goal 2 (Week 1): Pt will be able to don and doff pants over hips using a bridge in bed from supine. OT Short Term Goal 3 (Week 1): Pt will be able to reach R arm back behind his hips to be able to adjust pants and increase potential for full cleansing. OT Short Term Goal 4 (Week 1): Pt will don shirt with min A.  Skilled Therapeutic Interventions/Progress Updates:    Pt resting in bed upon arrival and agreeable to getting OOB for bathing/dressing w/c level at sink. Supine>sit EOB with supervision. All standing and transfers with Denna Haggard. Pt assists with setup of [name] before and after transfers. See Care Tool for assist levels. Pt able to cross (figure 4) BLE to bathe feet, doff/don socks, and thread one LE into pants. Pt used reacher to assist with threading other LE. Pt dependent for pulling pants over hips when standing in [place]. UB bathing/dressing with supervision. Sitting balance activities EOB for approx 10 mins before returning to supine. Pt able to bring BLE onto bed without assistance this morning. Pt repositioned in bed scooting with bed in Trendelenburg position. Bed returned to neutral. Pt remained in bed with all needs within reach and bed alarm activated.   Therapy Documentation Precautions:  Precautions Precautions: Fall Precaution Comments: PEG, sacral and toe wounds Restrictions Weight Bearing Restrictions: No  Pain: Pain Assessment Pain Scale: 0-10 Pain Score: 0-No pain   Therapy/Group: Individual Therapy  [name] 12/22/2020, 9:34 AM

## 2020-12-22 NOTE — Progress Notes (Signed)
Occupational Therapy Session Note  Patient Details  Name: Jackson Figueroa MRN: [id number] Date of Birth: [birth year]  Today's Date: 12/22/2020 OT Individual Time: 1345-1425 OT Individual Time Calculation (min): 40 min    Short Term Goals: Week 1:  OT Short Term Goal 1 (Week 1): Pt will be able to tolerate standing in [place] stedy for at least 3 minutes to demonstrate improved endurance. OT Short Term Goal 2 (Week 1): Pt will be able to don and doff pants over hips using a bridge in bed from supine. OT Short Term Goal 3 (Week 1): Pt will be able to reach R arm back behind his hips to be able to adjust pants and increase potential for full cleansing. OT Short Term Goal 4 (Week 1): Pt will don shirt with min A.  Skilled Therapeutic Interventions/Progress Updates:    Pt sleeping in bed upon arrival. Easily aroused. Pt requested use of BSC. Supine>sit EOB with min A for initial push from sidelying. Pt managed BLE without assistance. Transfer to [facility] with Denna Haggard. Dependent for toileting. Sit<>stand with [name] for hygiene and Probation officer. Pt noted with decreased strength compared to AM therapy. Pt required assistance managing BLE into bed with sit>supine. Pt required assistance repositioning in bed. Pt remained in bed with all needs within reach. Pt's girlfriend present.   Therapy Documentation Precautions:  Precautions Precautions: Fall Precaution Comments: PEG, sacral and toe wounds Restrictions Weight Bearing Restrictions: No  Pain:  Pt denies pain this afternoon    Therapy/Group: Individual Therapy  [name] 12/22/2020, 2:28 PM

## 2020-12-22 NOTE — Plan of Care (Signed)
  Problem: RH SKIN INTEGRITY Goal: RH STG SKIN FREE OF INFECTION/BREAKDOWN Description: Skin free of breakdown and infection with min assist to supervision assistance. Outcome: Progressing Goal: RH STG ABLE TO PERFORM INCISION/WOUND CARE W/ASSISTANCE Description: STG Able To Perform Incision/Wound Care With min Assistance to supervision assistance. Outcome: Progressing   Problem: RH KNOWLEDGE DEFICIT GENERAL Goal: RH STG INCREASE KNOWLEDGE OF SELF CARE AFTER HOSPITALIZATION Outcome: Progressing   Problem: Consults Goal: RH GENERAL PATIENT EDUCATION Description: See Patient Education module for education specifics. Outcome: Progressing Goal: Skin Care Protocol Initiated - if Braden Score 18 or less Description: If consults are not indicated, leave blank or document N/A Outcome: Progressing   

## 2020-12-22 NOTE — Progress Notes (Signed)
Physical Therapy Session Note  Patient Details  Name: Jackson Figueroa MRN: [id number] Date of Birth: [birth year]  Today's Date: 12/22/2020 PT Individual Time: 1105-1205 PT Individual Time Calculation (min): 60 min   Short Term Goals: Week 1:  PT Short Term Goal 1 (Week 1): Patient will be able to transition sit <> supine with CGA PT Short Term Goal 2 (Week 1): Patient will be able to transfer with LRAD and MaxA PT Short Term Goal 3 (Week 1): Patient will tolerate standing with LRAD for >1 min  Skilled Therapeutic Interventions/Progress Updates:    Pt received L semi-sidelying in bed attempting to un-weight sacral area and pt agreeable to therapy session. Supine>sitting R EOB via logroll technique using bedrail with min assist to initiate trunk upright. Sit>stand elevated EOB>sara using machine to lift dependently initially and pt tolerated standing with minimal sling support for 61min30second. Progressed to sit>stand 3x from significantly elevated EOB (due to pt's height)> BUE support on sara without using the machine to lift but instead mod/max assist of 1 - cuing for anterior trunk lean followed by increased glute activation to achieve upright. Progressed to repeated mini-squats in sara with sling positioned as a seat over the EOB to allow pt to go through small ROM squats - using B UE support on sara with supervision for safety and cuing for increased glute activation and increased quad control to avoid knees snapping in/out of extension (able to improve) x10reps. Pt reporting fatigue with standing. MD in/out for morning assessment. [name] transfer to w/c - pt requesting to leave [name] sling behind his back for lumbar support. Pt reports Roho w/c cushion felt too firm - therapist assessed and notes pt has ~1inch of support below ischial tuberosities - did unlock the air flow valve to allow increased air distribution and discussed trying to find a cover for the Roho to improve support around edge of the  cushion by keeping air pockets in line - educated pt on importance of upright sitting to off-weight sacrum.  In w/c performed the following seated B LE exercises:  - marches x15 reps each with pt demonstrating significant weakness bilaterally (R>L)  - long arc quads against 3lb weight 2x15 reps each with pt lacking terminal knee extension bilaterally (R>L) - ankle DF against 3lb ankle weight 2x15 reps each - L LE ankle eversion against level 1 theraband resistance as noted pt's L ankle/foot tends to rest in inversion position - ankle PF against level 1 theraband resistance 2x15 reps each At end of session pt agreeable to remain sitting in w/c for meal - left with needs in reach and RN present to swap out pt's bed.     Therapy Documentation Precautions:  Precautions Precautions: Fall Precaution Comments: PEG, sacral and toe wounds Restrictions Weight Bearing Restrictions: No  Pain: Reports some difficulty and "tenderness" around L lateral malleolus when performing L ankle eversion exercise.   Therapy/Group: Individual Therapy  Tawana Scale , PT, DPT, CSRS  12/22/2020, 8:26 AM

## 2020-12-22 NOTE — Progress Notes (Signed)
[place] PHYSICAL MEDICINE & REHABILITATION PROGRESS NOTE   Subjective/Complaints: Up in [place] plus no dizziness, working with PT  Discussed 1/29 blood work with pt  ROS: Pt denies SOB, abd pain, CP, N/V/D,    Objective:   No results found. No results for input(s): WBC, HGB, HCT, PLT in the last 72 hours. Recent Labs    12/20/20 0815 12/21/20 0556  NA 139 138  K 4.4 5.0  CL 102 105  CO2 25 24  GLUCOSE 128* 92  BUN 27* 26*  CREATININE 1.50* 1.37*  CALCIUM 9.6 8.9    Intake/Output Summary (Last 24 hours) at 12/22/2020 1115 Last data filed at 12/22/2020 0720 Gross per 24 hour  Intake 1294 ml  Output 2300 ml  Net -1006 ml     Pressure Injury 11/18/20 Toe (Comment  which one) Anterior;Right Deep Tissue Pressure Injury - Purple or maroon localized area of discolored intact skin or blood-filled blister due to damage of underlying soft tissue from pressure and/or shear. DTI left  (Active)  11/18/20 2330  Location: Toe (Comment  which one) (LEFT big toe)  Location Orientation: Anterior;Right  Staging: Deep Tissue Pressure Injury - Purple or maroon localized area of discolored intact skin or blood-filled blister due to damage of underlying soft tissue from pressure and/or shear.  Wound Description (Comments): DTI left toe  Present on Admission: Yes (on admit to 47M)    Physical Exam: Vital Signs Blood pressure (!) 100/59, pulse 85, temperature 98.3 F (36.8 C), temperature source Oral, resp. rate 18, height 6' (1.829 m), weight 110 kg, SpO2 95 %.  General: No acute distress Mood and affect are appropriate Heart: Regular rate and rhythm no rubs murmurs or extra sounds Lungs: Clear to auscultation, breathing unlabored, no rales or wheezes Abdomen: Positive bowel sounds, soft nontender to palpation, nondistended Extremities: No clubbing, cyanosis, or edema   Skin:    General: Skin is warm and dry.     Comments: Sacrum discolored with evidence of dried MASD with few  small abraded areas-- extends to perineum--see picture below. Right great toe and right 2nd toe with dried blisters. Bilateral plantar surface with dry flaky skin.   Neurological: Ox3 Motor: Right upper extremity: Shoulder abduction 3+/5, distally 3/5 Left upper extremity: 4 -/5 proximal distal Bilateral lower extremities: Hip flexion, knee extension 2+/5, ankle dorsiflexion 4/5  Reduced sensation fourth and fifth digits right hand Right hand intrinsic atrophy  Psychiatric:   appropriate, somewhat flat affect       Assessment/Plan: 1. Functional deficits which require 3+ hours per day of interdisciplinary therapy in a comprehensive inpatient rehab setting.  Physiatrist is providing close team supervision and 24 hour management of active medical problems listed below.  Physiatrist and rehab team continue to assess barriers to discharge/monitor patient progress toward functional and medical goals  Care Tool:  Bathing    Body parts bathed by patient: Right arm,Left arm,Chest,Abdomen,Front perineal area,Right upper leg,Left upper leg,Right lower leg,Left lower leg,Face   Body parts bathed by helper: Buttocks     Bathing assist Assist Level: Minimal Assistance - Patient > 75%     Upper Body Dressing/Undressing Upper body dressing   What is the patient wearing?: Pull over shirt    Upper body assist Assist Level: Supervision/Verbal cueing    Lower Body Dressing/Undressing Lower body dressing      What is the patient wearing?: Pants     Lower body assist Assist for lower body dressing: Moderate Assistance - Patient 50 -  74%     Toileting Toileting    Toileting assist Assist for toileting: Independent with assistive device Assistive Device Comment: Urinal   Transfers Chair/bed transfer  Transfers assist  Chair/bed transfer activity did not occur: Safety/medical concerns (due to weakness/fatigue)  Chair/bed transfer assist level:  (sara lift)      Locomotion Ambulation   Ambulation assist   Ambulation activity did not occur: Safety/medical concerns (due to weakness/fatigue)          Walk 10 feet activity   Assist  Walk 10 feet activity did not occur: Safety/medical concerns (due to weakness/fatigue)        Walk 50 feet activity   Assist Walk 50 feet with 2 turns activity did not occur: Safety/medical concerns (due to weakness/fatigue)         Walk 150 feet activity   Assist Walk 150 feet activity did not occur: Safety/medical concerns (due to weakness/fatigue)         Walk 10 feet on uneven surface  activity   Assist Walk 10 feet on uneven surfaces activity did not occur: Safety/medical concerns (due to weakness/fatigue)         Wheelchair     Assist Will patient use wheelchair at discharge?: Yes Type of Wheelchair: Manual Wheelchair activity did not occur: Safety/medical concerns (due to weakness/fatigue)         Wheelchair 50 feet with 2 turns activity    Assist    Wheelchair 50 feet with 2 turns activity did not occur: Safety/medical concerns (due to weakness/fatigue)       Wheelchair 150 feet activity     Assist  Wheelchair 150 feet activity did not occur: Safety/medical concerns (due to weakness/fatigue)       Blood pressure (!) 100/59, pulse 85, temperature 98.3 F (36.8 C), temperature source Oral, resp. rate 18, height 6' (1.829 m), weight 110 kg, SpO2 95 %.    Medical Problem List and Plan: 1.  Diffuse weakness with decreased endurance, balance deficits and pain affecting mobility and ADLs secondary to CIP/CIM- I.e ICU myopathy/neuropathy             -patient may shower             -ELOS/Goals: 27-30 days/supervision/min a             PT, OT CIR 2.  Antithrombotics: -DVT/anticoagulation:  Pharmaceutical: Lovenox             -antiplatelet therapy: N/A 3. Pain Management: tylenol prn  1/27- c/o pain in joints and HA's- will give tramadol 50 mg q6 hours  prn  1/28- pt hadn't taken- asked nursing to give this AM 4. Mood: LCSW to follow for evaluation and support.              -antipsychotic agents: Seroquel 25 5. Neuropsych: This patient is capable of making decisions on his own behalf. 6. Skin/Wound Care:                          DTI with MASD: areas on buttocks improving.  Zinc for moisture barrier as well as pressure relief measures.   1/27- will con't wound care and also PEG flushes- will order   1/28- asked nurse to assess /d/w OT/PT to see if can do speciality bed 7. Fluids/Electrolytes/Nutrition: Monitor I/O. Encourage fluid intake. Continue to monitor lytes with serial checks.  8. Morbid obesity: Educate on importance of diet and weight loss to help promote overall  health. Encourage side lying when in bed.  9. Pre-renal azotemia: Improving off tube feeds--encourage fluid intake.              CMP ordered for tomorrow.  1/29 BMET improving , mildly elevated BUN/Creat doing well off IVF 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              1/27- Hb 9.5- overall stable- con't regimen 11. Encephalopathy: Has resolved. Continue Seroquel to help with sleep hygeine.  12.  Dysphagia             D2 thins, advance diet as tolerated  1/28- also has PEG_ he wants it removed, but don't think it's time yet- also not drinking enough- might need free water flushes? 13. Recent trach  1/27- still has stoma that is healing- con't wound care.   14.  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 LOS: 4 days A FACE TO FACE EVALUATION WAS PERFORMED  Charlett Blake 12/22/2020, 11:15 AM

## 2020-12-23 LAB — CBC
HCT: 29 % — ABNORMAL LOW (ref 39.0–52.0)
Hemoglobin: 9.5 g/dL — ABNORMAL LOW (ref 13.0–17.0)
MCH: 33 pg (ref 26.0–34.0)
MCHC: 32.8 g/dL (ref 30.0–36.0)
MCV: 100.7 fL — ABNORMAL HIGH (ref 80.0–100.0)
Platelets: 210 10*3/uL (ref 150–400)
RBC: 2.88 MIL/uL — ABNORMAL LOW (ref 4.22–5.81)
RDW: 14.2 % (ref 11.5–15.5)
WBC: 7.1 10*3/uL (ref 4.0–10.5)
nRBC: 0 % (ref 0.0–0.2)

## 2020-12-23 MED ORDER — PREGABALIN 50 MG PO CAPS
50.0000 mg | ORAL_CAPSULE | Freq: Three times a day (TID) | ORAL | Status: DC
  Administered 2020-12-23 – 2021-01-05 (×40): 50 mg via ORAL
  Filled 2020-12-23 (×40): qty 1

## 2020-12-23 NOTE — Progress Notes (Signed)
Physical Therapy Session Note  Patient Details  Name: Jackson Figueroa MRN: [id number] Date of Birth: Jan 26, 1961  Today's Date: 12/23/2020 PT Individual Time: 1300-1400 PT Individual Time Calculation (min): 60 min   Short Term Goals: Week 1:  PT Short Term Goal 1 (Week 1): Patient will be able to transition sit <> supine with CGA PT Short Term Goal 2 (Week 1): Patient will be able to transfer with LRAD and MaxA PT Short Term Goal 3 (Week 1): Patient will tolerate standing with LRAD for >1 min  Skilled Therapeutic Interventions/Progress Updates:    Pt received supine in bed, agreeable to PT session. No complaints of pain. Supine to sitting EOB with mod A needed for trunk elevation, use of bedrail. Sit to stand with use of [name]. [name] transfer to w/c. Manual w/c propulsion 2 x 10 ft with use of BUE at Supervision level before onset of fatigue due to RUE weakness. [name] transfer to mat table. Several attempts from elevated mat table to stand to stedy with assist x 2. After several failed attempts pt finally able to stand to stedy but only able to maintain standing with B knees locked and with heavy UE reliance. Sit to stand x 5 reps with [name] Plus with focus on pt performing last 10% of transfer without assist from [name] sling. Standing mini-squats x 7 reps, x 5 reps to fatigue with heavy UE reliance. Pt fatigues very quickly during session with several seated rest breaks required between tasks. Pt requests to return to bed at end of session. [name] transfer back to w/c then back to bed. Sit to supine mod A needed for BLE management. Pt left semi-reclined in bed with needs in reach, ice packs to B knees for pain management due to chronic knee pain.  Therapy Documentation Precautions:  Precautions Precautions: Fall Precaution Comments: PEG, sacral and toe wounds Restrictions Weight Bearing Restrictions: No   Therapy/Group: Individual Therapy   Excell Seltzer, PT,  DPT  12/23/2020, 5:15 PM

## 2020-12-23 NOTE — Progress Notes (Signed)
Patient ID: Jackson Figueroa, male   DOB: Jun 14, 1961, 60 y.o.   MRN: [id number]  SW spoke with Cecille Rubin at [facility] [phone]) to schedule new patient appointment. Pt will be seen on Thursdays, March 3 at 1pm with Dr. Posey Pronto.   Loralee Pacas, MSW, [place] Office: [phone] Cell: [phone] Fax: [phone]

## 2020-12-23 NOTE — Progress Notes (Signed)
[place] PHYSICAL MEDICINE & REHABILITATION PROGRESS NOTE   Subjective/Complaints:  Pt reports doesn't like getting IVFs- would "rather drink enough".   Said he's "on the 5th bed" to see if more comfortable in specialty bed- slept well, but doesn't think it's real comfortable.   Said doesn't inflate enough? Appears inflated?  Having nerve pain in R hand due to ulnar neuropathy- asking for something for nerve pain.  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  LBM yesterday on BSC- needs to go again today.   Cr down to 1.37- getting better- will see if pt can drink enough to improve Cr.     ROS:  Pt denies SOB, abd pain, CP, N/V/C/D, and vision changes   Objective:   No results found. Recent Labs    12/23/20 0632  WBC 7.1  HGB 9.5*  HCT 29.0*  PLT 210   Recent Labs    12/21/20 0556  NA 138  K 5.0  CL 105  CO2 24  GLUCOSE 92  BUN 26*  CREATININE 1.37*  CALCIUM 8.9    Intake/Output Summary (Last 24 hours) at 12/23/2020 1235 Last data filed at 12/23/2020 0900 Gross per 24 hour  Intake 236 ml  Output 975 ml  Net -739 ml     Pressure Injury 11/18/20 Toe (Comment  which one) Anterior;Right Deep Tissue Pressure Injury - Purple or maroon localized area of discolored intact skin or blood-filled blister due to damage of underlying soft tissue from pressure and/or shear. DTI left  (Active)  11/18/20 2330  Location: Toe (Comment  which one) (LEFT big toe)  Location Orientation: Anterior;Right  Staging: Deep Tissue Pressure Injury - Purple or maroon localized area of discolored intact skin or blood-filled blister due to damage of underlying soft tissue from pressure and/or shear.  Wound Description (Comments): DTI left toe  Present on Admission: Yes (on admit to 25M)    Physical Exam: Vital Signs Blood pressure 105/71, pulse 81, temperature 97.8 F (36.6 C), temperature source Oral, resp. rate 18, height 6' (1.829 m), weight 110 kg, SpO2 99 %.  General: No  acute distress- laying in bed- appropriate, c/o R hand nerve pain Mood and affect are appropriate no change Heart: RRR Lungs: CTA B/L- no W/R/R- good air movement Abdomen: Soft, NT, ND, (+)BS  Extremities: No clubbing, cyanosis, or edema   Skin:    General: Skin is warm and dry.     Comments: Sacrum discolored with evidence of dried MASD with few small abraded areas-- extends to perineum--see picture below. Right great toe and right 2nd toe with dried blisters. Bilateral plantar surface with dry flaky skin.   Neurological: Ox3 Motor: Right upper extremity: Shoulder abduction 3+/5, distally 3/5 Left upper extremity: 4 -/5 proximal distal Bilateral lower extremities: Hip flexion, knee extension 2+/5, ankle dorsiflexion 4/5  Reduced sensation fourth and fifth digits right hand Right hand intrinsic atrophy        Assessment/Plan: 1. Functional deficits which require 3+ hours per day of interdisciplinary therapy in a comprehensive inpatient rehab setting.  Physiatrist is providing close team supervision and 24 hour management of active medical problems listed below.  Physiatrist and rehab team continue to assess barriers to discharge/monitor patient progress toward functional and medical goals  Care Tool:  Bathing    Body parts bathed by patient: Right arm,Left arm,Chest,Abdomen,Front perineal area,Right upper leg,Left upper leg,Right lower leg,Left lower leg,Face   Body parts bathed by helper: Buttocks     Bathing assist Assist Level:  Minimal Assistance - Patient > 75%     Upper Body Dressing/Undressing Upper body dressing   What is the patient wearing?: Pull over shirt    Upper body assist Assist Level: Supervision/Verbal cueing    Lower Body Dressing/Undressing Lower body dressing      What is the patient wearing?: Pants     Lower body assist Assist for lower body dressing: Moderate Assistance - Patient 50 - 74%     Toileting Toileting    Toileting  assist Assist for toileting: Independent with assistive device Assistive Device Comment: Urinal   Transfers Chair/bed transfer  Transfers assist  Chair/bed transfer activity did not occur: Safety/medical concerns (due to weakness/fatigue)  Chair/bed transfer assist level: Dependent - mechanical lift Jackson Figueroa)     Locomotion Ambulation   Ambulation assist   Ambulation activity did not occur: Safety/medical concerns (due to weakness/fatigue)          Walk 10 feet activity   Assist  Walk 10 feet activity did not occur: Safety/medical concerns (due to weakness/fatigue)        Walk 50 feet activity   Assist Walk 50 feet with 2 turns activity did not occur: Safety/medical concerns (due to weakness/fatigue)         Walk 150 feet activity   Assist Walk 150 feet activity did not occur: Safety/medical concerns (due to weakness/fatigue)         Walk 10 feet on uneven surface  activity   Assist Walk 10 feet on uneven surfaces activity did not occur: Safety/medical concerns (due to weakness/fatigue)         Wheelchair     Assist Will patient use wheelchair at discharge?: Yes Type of Wheelchair: Manual Wheelchair activity did not occur: Safety/medical concerns (due to weakness/fatigue)         Wheelchair 50 feet with 2 turns activity    Assist    Wheelchair 50 feet with 2 turns activity did not occur: Safety/medical concerns (due to weakness/fatigue)       Wheelchair 150 feet activity     Assist  Wheelchair 150 feet activity did not occur: Safety/medical concerns (due to weakness/fatigue)       Blood pressure 105/71, pulse 81, temperature 97.8 F (36.6 C), temperature source Oral, resp. rate 18, height 6' (1.829 m), weight 110 kg, SpO2 99 %.    Medical Problem List and Plan: 1.  Diffuse weakness with decreased endurance, balance deficits and pain affecting mobility and ADLs secondary to CIP/CIM- I.e ICU myopathy/neuropathy              -patient may shower             -ELOS/Goals: 27-30 days/supervision/min a             PT, OT CIR 2.  Antithrombotics: -DVT/anticoagulation:  Pharmaceutical: Lovenox             -antiplatelet therapy: N/A 3. Pain Management: tylenol prn  1/27- c/o pain in joints and HA's- will give tramadol 50 mg q6 hours prn  1/28- pt hadn't taken- asked nursing to give this AM  1/31- will start Lyrica 50 mg TID for nerve pain- since Gabapentin has more cognitive side effects, he wants to stay away from gabapentin.  4. Mood: LCSW to follow for evaluation and support.              -antipsychotic agents: Seroquel 25 5. Neuropsych: This patient is capable of making decisions on his own behalf. 6. Skin/Wound Care:  DTI with MASD: areas on buttocks improving.  Zinc for moisture barrier as well as pressure relief measures.   1/27- will con't wound care and also PEG flushes- will order   1/28- asked nurse to assess /d/w OT/PT to see if can do speciality bed  1/31- on specialty bed- low air loss mattress 7. Fluids/Electrolytes/Nutrition: Monitor I/O. Encourage fluid intake. Continue to monitor lytes with serial checks.  8. Morbid obesity: Educate on importance of diet and weight loss to help promote overall health. Encourage side lying when in bed.  9. Pre-renal azotemia: Improving off tube feeds--encourage fluid intake.              CMP ordered for tomorrow.  1/29 BMET improving , mildly elevated BUN/Creat doing well off IVF  1/31- Cr down to 1.37 s/p IVFs -will remove IV and let pt work on drinking will recheck Thursday.  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              1/27- Hb 9.5- overall stable- con't regimen 11. Encephalopathy: Has resolved. Continue Seroquel to help with sleep hygeine.  12.  Dysphagia             D2 thins, advance diet as tolerated  1/28- also has PEG_ he wants it removed, but don't think it's time yet- also not drinking enough-  might need free water flushes?  1/31- will check when it's due to be removed.  13. Recent trach  1/27- still has stoma that is healing- con't wound care.   14.  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  1/31- will start Lyrica 50 mg TID_ explained won't help numbness/weakness, only nerve pain.    LOS: 5 days A FACE TO FACE EVALUATION WAS PERFORMED  Jackson Figueroa 12/23/2020, 12:35 PM

## 2020-12-23 NOTE — Progress Notes (Signed)
Occupational Therapy Session Note  Patient Details  Name: Jackson Figueroa MRN: [id number] Date of Birth: 10-Mar-1961  Today's Date: 12/23/2020 OT Individual Time: 4496-7591 OT Individual Time Calculation (min): 73 min    Short Term Goals: Week 1:  OT Short Term Goal 1 (Week 1): Pt will be able to tolerate standing in [place] stedy for at least 3 minutes to demonstrate improved endurance. OT Short Term Goal 2 (Week 1): Pt will be able to don and doff pants over hips using a bridge in bed from supine. OT Short Term Goal 3 (Week 1): Pt will be able to reach R arm back behind his hips to be able to adjust pants and increase potential for full cleansing. OT Short Term Goal 4 (Week 1): Pt will don shirt with min A.  Skilled Therapeutic Interventions/Progress Updates:    Pt resting in bed upon arrival. Pt requested to get in w/c and leave the room. Supine>sit EOB with min A to push up from sidelying. Transfer to w/c with [name]. Pt propelled w/c approx 10' but exhibited difficulty grasping Rt w/c rim. Theraband placed on rim and w/c gloves issued to pt. Pt engaged in BUE therex on [employer]-work load 3 for 5 mins with rest breakx1. Pt engaged in [place] activities with focus on BUE shoulder AROM, reaching across midline, and general activity tolerance/strengthening. Pt with significant compensatory strategies/techniwues when reaching above 90 with noted lateral leans in w/c. Pt propelled w/c approx 75'. Pt tranfserred back to bed with [name]. Pt able to lift BLE onto bed and assist with repositioning. Pt remained in bed with all needs within reach.   Therapy Documentation Precautions:  Precautions Precautions: Fall Precaution Comments: PEG, sacral and toe wounds Restrictions Weight Bearing Restrictions: No   Pain: Pt denies pain this morning  Therapy/Group: Individual Therapy  [name] 12/23/2020, 10:52 AM

## 2020-12-23 NOTE — Progress Notes (Signed)
Speech Language Pathology Discharge Summary  Patient Details  Name: Jackson Figueroa MRN: [id number] Date of Birth: 12/03/1960  Today's Date: 12/23/2020 SLP Individual Time: 0850-0930 SLP Individual Time Calculation (min): 40 min   Skilled Therapeutic Interventions: Skilled ST services focused on education and swallow skills. SLP facilitated PO consumption snakc trials of regular textures and thin liquids via straw. Pt demonstrated appropriate oral clearance, swallow appeared timely and no overt s/s aspiration. Pt reported tolerating upgrade diet over the weekend and with breakfast. Pt recall swallow strategies for safe PO consumption pertaining to postioning and use of smaller bites/sips. Education was completed and no further ST services needed. Pt was left in room with call bell within reach and bed alarm set.     Patient has met 3 of 3 long term goals.  Patient to discharge at overall Modified Independent level.  Reasons goals not met:     Clinical Impression/Discharge Summary:   Pt made great progress meeting 3 out 3 goals, discharging at Mod I of swallow function on least restrictive diet of regular textures and thin liquids. Pt demonstrated dramatic improvement in swallow function over pt's course of stay. Sensed aspiration noted on most recent MBS 1/14, however followed by direct treatment, pt dysphagia appeared to have resolved. Pt is tolerating regular textures and thin liquids with no overt s/s aspiration. Education was completed of proper positioning for PO intake and use of small bites/sips. Pt benefited from skilled ST services in order to maximize functional independence and reduce burden of care, no further ST services needed.   Care Partner:        Recommendation:  None      Equipment:   N/A  Reasons for discharge: Treatment goals met   Patient/Family Agrees with Progress Made and Goals Achieved: Yes       12/23/2020, 9:20 AM    

## 2020-12-24 DIAGNOSIS — R5381 Other malaise: Principal | ICD-10-CM

## 2020-12-24 NOTE — Progress Notes (Signed)
[facility] PHYSICAL MEDICINE & REHABILITATION PROGRESS NOTE   Subjective/Complaints:  Pt reports Lyrica has already taken the edge off of nerve pain-  Slept like a rock, in spite of the bed- which feels like it's not completely inflated (does look inflated on exam) Bowels working well per pt.   ROS:  Pt denies SOB, abd pain, CP, N/V/C/D, and vision changes   Objective:   No results found. Recent Labs    12/23/20 0632  WBC 7.1  HGB 9.5*  HCT 29.0*  PLT 210   No results for input(s): NA, K, CL, CO2, GLUCOSE, BUN, CREATININE, CALCIUM in the last 72 hours.  Intake/Output Summary (Last 24 hours) at 12/24/2020 0910 Last data filed at 12/24/2020 6269 Gross per 24 hour  Intake 600 ml  Output 2900 ml  Net -2300 ml     Pressure Injury 11/18/20 Toe (Comment  which one) Anterior;Right Deep Tissue Pressure Injury - Purple or maroon localized area of discolored intact skin or blood-filled blister due to damage of underlying soft tissue from pressure and/or shear. DTI left  (Active)  11/18/20 2330  Location: Toe (Comment  which one) (LEFT big toe)  Location Orientation: Anterior;Right  Staging: Deep Tissue Pressure Injury - Purple or maroon localized area of discolored intact skin or blood-filled blister due to damage of underlying soft tissue from pressure and/or shear.  Wound Description (Comments): DTI left toe  Present on Admission: Yes (on admit to 66M)    Physical Exam: Vital Signs Blood pressure 99/66, pulse 73, temperature 97.9 F (36.6 C), resp. rate 18, height 6' (1.829 m), weight 110 kg, SpO2 100 %.  General: sitting up in bed; specialty air mattress, appropriate, asking for blanket/water, NAD Mood and affect are appropriate Heart: RRR Lungs: CTA B/L- no W/R/R- good air movement Abdomen: Soft, NT, ND, (+)BS  Extremities: No clubbing, cyanosis, or edema R ulnar neuropathy- numbness in lateral 4th/5th digit on R with intrinsic atrophy  Skin:    General: Skin is warm  and dry.     Comments: Sacrum discolored with evidence of dried MASD with few small abraded areas-- extends to perineum--see picture below. Right great toe and right 2nd toe with dried blisters. Bilateral plantar surface with dry flaky skin.   Neurological: Ox3 Motor: Right upper extremity: Shoulder abduction 3+/5, distally 3/5 Left upper extremity: 4 -/5 proximal distal Bilateral lower extremities: Hip flexion, knee extension 2+/5, ankle dorsiflexion 4/5         Assessment/Plan: 1. Functional deficits which require 3+ hours per day of interdisciplinary therapy in a comprehensive inpatient rehab setting.  Physiatrist is providing close team supervision and 24 hour management of active medical problems listed below.  Physiatrist and rehab team continue to assess barriers to discharge/monitor patient progress toward functional and medical goals  Care Tool:  Bathing    Body parts bathed by patient: Right arm,Left arm,Chest,Abdomen,Front perineal area,Right upper leg,Left upper leg,Right lower leg,Left lower leg,Face   Body parts bathed by helper: Buttocks     Bathing assist Assist Level: Minimal Assistance - Patient > 75%     Upper Body Dressing/Undressing Upper body dressing   What is the patient wearing?: Pull over shirt    Upper body assist Assist Level: Supervision/Verbal cueing    Lower Body Dressing/Undressing Lower body dressing      What is the patient wearing?: Pants     Lower body assist Assist for lower body dressing: Moderate Assistance - Patient 50 - 74%     Toileting  Toileting    Toileting assist Assist for toileting: Independent with assistive device Assistive Device Comment: Urinal   Transfers Chair/bed transfer  Transfers assist  Chair/bed transfer activity did not occur: Safety/medical concerns (due to weakness/fatigue)  Chair/bed transfer assist level: Dependent - mechanical lift     Locomotion Ambulation   Ambulation assist    Ambulation activity did not occur: Safety/medical concerns (due to weakness/fatigue)          Walk 10 feet activity   Assist  Walk 10 feet activity did not occur: Safety/medical concerns (due to weakness/fatigue)        Walk 50 feet activity   Assist Walk 50 feet with 2 turns activity did not occur: Safety/medical concerns (due to weakness/fatigue)         Walk 150 feet activity   Assist Walk 150 feet activity did not occur: Safety/medical concerns (due to weakness/fatigue)         Walk 10 feet on uneven surface  activity   Assist Walk 10 feet on uneven surfaces activity did not occur: Safety/medical concerns (due to weakness/fatigue)         Wheelchair     Assist Will patient use wheelchair at discharge?: Yes Type of Wheelchair: Manual Wheelchair activity did not occur: Safety/medical concerns (due to weakness/fatigue)  Wheelchair assist level: Supervision/Verbal cueing Max wheelchair distance: 10'    Wheelchair 50 feet with 2 turns activity    Assist    Wheelchair 50 feet with 2 turns activity did not occur: Safety/medical concerns (due to weakness/fatigue)       Wheelchair 150 feet activity     Assist  Wheelchair 150 feet activity did not occur: Safety/medical concerns (due to weakness/fatigue)       Blood pressure 99/66, pulse 73, temperature 97.9 F (36.6 C), resp. rate 18, height 6' (1.829 m), weight 110 kg, SpO2 100 %.    Medical Problem List and Plan: 1.  Diffuse weakness with decreased endurance, balance deficits and pain affecting mobility and ADLs secondary to CIP/CIM- I.e ICU myopathy/neuropathy             -patient may shower             -ELOS/Goals: 27-30 days/supervision/min a             PT, OT CIR  2/1- team conference today to determine d/c date/plan 2.  Antithrombotics: -DVT/anticoagulation:  Pharmaceutical: Lovenox             -antiplatelet therapy: N/A 3. Pain Management: tylenol prn  1/27- c/o pain in  joints and HA's- will give tramadol 50 mg q6 hours prn  1/28- pt hadn't taken- asked nursing to give this AM  1/31- will start Lyrica 50 mg TID for nerve pain- since Gabapentin has more cognitive side effects, he wants to stay away from gabapentin.   2/1- has already taken the edge off of pain- con't regimen- no side effects so far 4. Mood: LCSW to follow for evaluation and support.              -antipsychotic agents: Seroquel 25 5. Neuropsych: This patient is capable of making decisions on his own behalf. 6. Skin/Wound Care:                          DTI with MASD: areas on buttocks improving.  Zinc for moisture barrier as well as pressure relief measures.   1/27- will con't wound care and also PEG flushes-  will order   1/28- asked nurse to assess /d/w OT/PT to see if can do speciality bed  1/31- on specialty bed- low air loss mattress  2/1- will d/w staff the feeling it's not inflated, even though appears it is.  7. Fluids/Electrolytes/Nutrition: Monitor I/O. Encourage fluid intake. Continue to monitor lytes with serial checks.  8. Morbid obesity: Educate on importance of diet and weight loss to help promote overall health. Encourage side lying when in bed.  9. Pre-renal azotemia: Improving off tube feeds--encourage fluid intake.              CMP ordered for tomorrow.  1/29 BMET improving , mildly elevated BUN/Creat doing well off IVF  1/31- Cr down to 1.37 s/p IVFs -will remove IV and let pt work on drinking will recheck Thursday.  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              1/27- Hb 9.5- overall stable- con't regimen 11. Encephalopathy: Has resolved. Continue Seroquel to help with sleep hygeine.  12.  Dysphagia             D2 thins, advance diet as tolerated  1/28- also has PEG_ he wants it removed, but don't think it's time yet- also not drinking enough- might need free water flushes?  1/31- will check when it's due to be removed.  13. Recent  trach  1/27- still has stoma that is healing- con't wound care.   14.  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  1/31- will start Lyrica 50 mg TID_ explained won't help numbness/weakness, only nerve pain.  2/1- is already helping- con't regimen    LOS: 6 days A FACE TO FACE EVALUATION WAS PERFORMED  Jackson Figueroa 12/24/2020, 9:10 AM

## 2020-12-24 NOTE — Patient Care Conference (Signed)
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 Courtney Heys Care Coodinator Present: Loralee Pacas, LCSWA;Dyamond Tolosa Creig Hines, RN, BSN, [place] Nurse Present: Dwaine Gale, RN PT Present: Excell Seltzer, PT OT Present: Roanna Epley, COTA;Jennifer Tamala Julian, OT PPS Coordinator present : Ileana Ladd, Burna Mortimer, SLP     Current Status/Progress Goal Weekly Team Focus  Bowel/Bladder   Continent x2 LBM1/31  Remain continent  Keep urinal at bedside   Swallow/Nutrition/ Hydration             ADL's   transfers with [name] Plus; UB bathing/dressing-CGA; LB bathing-mod A; LB dressing-mod A; sitting balance-supervision; limited endurance  CGA overall  overall strengthening/endurance, sitting balance, tranfsers, BADL training; safety awareness   Mobility   min A bed mobility, dependent for transfers with [name] Plus, w/c mobility x 10 ft Supervision  CGA overall, gait 50 ft CGA, min A x 14 stairs (likely will need to downgrade pending progress)  LE NMR, standing and WBing as able, transfers, w/c mobility   Communication             Safety/Cognition/ Behavioral Observations            Pain   no pain reported  <3 out of 10  assess and treat PRN   Skin   MASD to buttocks-foam in place 3 areas abrasion areas around sacrum, pink/blanchable area on top of left toe, right toe and side  of left foot. No blisters noted.  No new skin breakdown this admission  Assess skin q shift, air mattress in place     Discharge Planning:  Pt to d/c to home with s/o [name] who will provide 24/7 care to pt. She has back issues so patient would be more appropriate for atleast Supervision to Mod I at d/c to reduce caregier burden.   Team Discussion: Would like PEG out however, it's not time. Complains of bed not being inflated properly but by looking at it, it looks correct. Lurline Idol site is now OTA, MASD to bottom, treated appropriately. Continent B/B. Dry skin to feet. Discontinue order to paint betadine on toes and fix wounds in charting. Push fluids, and discontinue IV. Significant other has health issues of her own, she has back issues, wears a brace and walks with a Rolator. Patient on target to meet rehab goals: Mod assist overall, [name] plus for transfers, can use the slideboard with MD approval. Neuropathy in hand, pain associated. Bathing and dressing at the W/C level. Upper body contact guard and can thread pants. Lower body mod assist to don socks on/off. Right hand is the weakest.  *See Care Plan and progress notes for long and short-term goals.   Revisions to Treatment Plan:  Discontinue IV, discontinue order to paint betadine on toes.  Teaching Needs: Continue family education, medication management, wound/skin care, transfer training, gait training, stair training  Current Barriers to Discharge: Inaccessible home environment, Decreased caregiver support, Home enviroment access/layout, Wound care, Lack of/limited family support, Weight, Medication compliance, Behavior and Nutritional means  Possible Resolutions to Barriers: Continue current medications, offer fluids and nutritional support, provide emotional support to patient and family.     Medical Summary Current Status: Cr 1.37- doing better with drinking- will d/c IV; B/B continent; trach site- is closed; PEG placed 12/30- MASD  on bottom; feet now just dry skin- blisters healed  Barriers to Discharge: Decreased family/caregiver support;Home enviroment access/layout;Medical stability;Other (comments);Weight;Wound care;Nutrition means  Barriers to Discharge Comments: PEG- cannot remove for 4+ weeks; trach site healed/closed; home with Significant other- back issues rollator/LSO; ICU myopathy and neuropathy! SO weak! ulnar neuropathy on R as well; Possible Resolutions to [employer]: transfer sara plus; can't do steady; can try transfer board; w/c only 53ft due to neuropathy; long way to go-push PO fluids- d/c IV; - 4 weeks- max we can give him   Continued Need for Acute Rehabilitation Level of Care: The patient requires daily medical management by a physician with specialized training in physical medicine and rehabilitation for the following reasons: Direction of a multidisciplinary physical rehabilitation program to maximize functional independence : Yes Medical management of patient stability for increased activity during participation in an intensive rehabilitation regime.: Yes Analysis of laboratory values and/or radiology reports with any subsequent need for medication adjustment and/or medical intervention. : Yes   I attest that I was present, lead the team conference, and concur with the assessment and plan of the team.   Cristi Loron 12/24/2020, 3:13 PM

## 2020-12-24 NOTE — Progress Notes (Signed)
Physical Therapy Session Note  Patient Details  Name: Jackson Figueroa MRN: [id number] Date of Birth: July 30, 1961  Today's Date: 12/24/2020 PT Individual Time: 1300-1400; 1620-1700 PT Individual Time Calculation (min): 60 min and 40 min  Short Term Goals: Week 1:  PT Short Term Goal 1 (Week 1): Patient will be able to transition sit <> supine with CGA PT Short Term Goal 2 (Week 1): Patient will be able to transfer with LRAD and MaxA PT Short Term Goal 3 (Week 1): Patient will tolerate standing with LRAD for >1 min  Skilled Therapeutic Interventions/Progress Updates:    Session 1: Pt received seated in w/c in room, agreeable to PT session. Pt reports soreness in buttocks from sitting up in w/c for over an hour. Education with patient regarding pressure relief. Manual w/c propulsion x 150 ft with use of BUE at min A level for steering at times and doorway management, increased time needed due to RUE fatigue. Introduced Warehouse manager transfers this session. Slide board transfer w/c uphill to mat table with max A to initiate transfer, cues for sequencing and head/hips relationship. Slide board transfer back to w/c with min A. Sit to supine mod A for BLE management. Supine bridges 2 x 10 reps to fatigue with assist to stabilize BLE on mat table. Pt returned to sitting EOM with min A needed for trunk elevation from flat surface. Seated lateral leans L/R x 15 reps each at Supervision level. Pt requests to return to bed at end of session. Use of [name] for transfer back to bed. Sit to supine mod A for BLE management. Pt left semi-reclined in bed with needs in reach at end of session.  Session 2: Pt received sidelying in bed asleep, arousable and agreeable to PT session. No complaints of pain. Sidelying to sitting EOB at Supervision level with increased time, HOB slightly elevated, use of bedrails. Session focus on standing with use of [name]. Use of device to assist pt with 90% of full stand, pt able to  utilize BLE to achieve full upright stance. Standing mini-squats 2 x 10 reps to fatigue while in [employer]. With onset of fatigue pt exhibits decreased B knee control. Pt requests to return to bed at end of session. Sit to supine mod A for BLE management. Pt left semi-reclined in bed with needs in reach, bed alarm in place at end of session.  Therapy Documentation Precautions:  Precautions Precautions: Fall Precaution Comments: PEG, sacral and toe wounds Restrictions Weight Bearing Restrictions: No    Therapy/Group: Individual Therapy   Excell Seltzer, PT, DPT  12/24/2020, 5:04 PM

## 2020-12-24 NOTE — Progress Notes (Signed)
1 suture noted to right side of neck. MD Lov[place] notified. Order to remove. Suture removed without complications. Area pink and dry.

## 2020-12-24 NOTE — Progress Notes (Signed)
Patient ID: Jackson Figueroa, male   DOB: 10-12-1961, 60 y.o.   MRN: [id number]   SW met with pt in room to provide updates from team conference, and d/c date is 4 weeks. SW informed will continue to provide updates on any changes. He reports that his s/o [name] will be here tomorrow afternoon around 1:30pm.  Loralee Pacas, MSW, [place] Office: [phone] Cell: [phone] Fax: [phone]

## 2020-12-24 NOTE — Consult Note (Signed)
Neuropsychological Consultation   Patient:   Jackson Figueroa   DOB:   August 28, 1961  MR Number:  [id number]  Location:  [facility] [id number] [place] [address] Dept: [place]: [phone]           Date of Service:   12/24/2020  Start Time:   2 PM End Time:   3 PM  Provider/Observer:  Ilean Skill, Psy.D.       Clinical Neuropsychologist       Billing Code/Service: [phone]  Chief Complaint:    Jackson Figueroa is a 60 year old male with a history of morbid obesity.  Patient was initially admitted with Covid pneumonia with septic shock on 10/12/2020 and treated with COVID protocol.  Patient was unvaccinated for COVID-19.  Patient developed aspiration pneumonia, AKI requiring CRRT as well as ARDS resulting in prolonged ventilation, PAF, ileus, encephalopathy, recurrent fevers due to multi resistant infection as well as developing DTI on skin scrotums/thigh and toes.  Patient required trach on 11/14/2020 and PEG placed on 11/21/2020.  Palliative care was consulted and family elected on full scope of care.  Patient tolerated extubation to ATC and was weaned to room air on 12/09/2018.  He tolerated plugging and was decannulated on 12/17/2020.  Swallow function has improved the patient continued to have issues with critical illness neuropathy/myopathy with decreased endurance, balance deficits and pain affecting mobility and ADLs.  Reason for Service:  Patient has had issues with agitation and frustration during CIR care.  Patient has improved cognitive function a great deal but continues to be very weak with significant muscle weakness and muscle loss.  Below is the HPI for the current admission.  HPI: Jackson Figueroa is a 60 year old male with h/o morbid obesity who was originally admitted with Covid PNA with septic shock on 10/12/2020 and treated with Covid protocol.  History from chart review and patient--has  not been vaccinated against Covid.   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  He required tracheostomy on 11/14/2020 by Dr. Redmond Baseman and PEG placed on 11/21/2020 by Dr. Laurence Ferrari. Palliative care consulted to discuss goals of care and family elected on full scope of care.   He tolerated extubation to ATC, was weaned to room air on 12/09/2020.  He tolerated plugging and was decannulated on 12/17/2020.  Swallow function has improved and he has been advanced to dysphagia #2, thin liquids with use of aspiration precautions. 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 Therapy ongoing and working on endurance with sitting in a chair for an hour as well as trials in [place] to attempt weight bearing on BLE for 90 seconds. CIR recommended due to functional decline and severe deconditioning.  Please see preadmission assessment from earlier today as well.  Current Status:  Patient was laying in his bed slightly elevated when I entered the room.  He was alert and oriented.  Cognition appeared to be within normal limits.  The patient admitted to irritability and agitation with extended hospital stay.  Patient reports that he remembers little of his early parts of his illness but did remember some very vivid and distressing  dreams once his intubation had ended.  Patient reports that these dreams have diminished or stopped at this point.  Patient acknowledges frustration but understands need and is motivated to continue with therapeutic interventions to regain functionality.  Patient denies significant depression or  anxiety at this point that is limiting his therapeutic efforts but does acknowledge agitation irritability more to do with frustration over his overall care.  Patient did report his willingness to get his COVID-19 vaccine at some point during his hospital course when appropriate.  Behavioral Observation: Jackson Figueroa  presents as a 60 y.o.-year-old Right Caucasian Male who appeared his stated age. his dress was Appropriate and he was Well Groomed and his manners were Appropriate to the situation.  his participation was indicative of Appropriate and Redirectable behaviors.  There were physical disabilities noted.  he displayed an appropriate level of cooperation and motivation.     Interactions:    Active Appropriate and Redirectable  Attention:   abnormal and attention span appeared shorter than expected for age  Memory:   within normal limits; recent and remote memory intact with the exception of times during his intubation and most severe episodes of his critical illness  Visuo-spatial:  not examined  Speech (Volume):  normal  Speech:   normal; normal  Thought Process:  Coherent and Relevant  Though Content:  WNL; not suicidal and not homicidal  Orientation:   person, place, time/date and situation  Judgment:   Fair  Planning:   Fair  Affect:    Irritable  Mood:    Dysphoric  Insight:   Good  Intelligence:   high  Medical History:   Past Medical History:  Diagnosis Date  . Allergic   . Knee pain, bilateral    due to old injuires  . Numbness and tingling in right hand    and elbow due to sports injury  . Obesity   . Staph infection    of appy wound--treated with antibiotics X 4 weeks.   . Tendinitis of elbow    right         Patient Active Problem List   Diagnosis Date Noted  . Debility 12/18/2020  . Dysphagia   . 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 Critical illness myopathy   . Acute blood loss anemia   . Physical deconditioning   . Tracheostomy care  ([place])   . Acute hypercapnic respiratory failure ([place])   . Malnutrition of moderate degree 11/30/2020  . 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 Ileus ([place])   . Acute respiratory failure with hypoxemia ([place])   . AKI (acute kidney injury) ([place]) 11/11/2020  . Protein calorie malnutrition ([place]) 11/11/2020  . Acute metabolic encephalopathy 83/41/9622  . Acute respiratory distress syndrome (ARDS) due to COVID-19 virus ([place])   . Septic shock ([place])   . Acute respiratory failure due to COVID-19 [facility]) 10/20/2020    Psychiatric History:  Patient without prior psychiatric history  Family Med/Psych History:  Family History  Problem Relation Age of Onset  . Cerebral aneurysm Mother 23  . Heart disease Father   . Migraines Sister    Impression/DX:  Jackson Figueroa is a 60 year old male with a history of morbid obesity.  Patient was initially admitted with Covid pneumonia with septic shock on 10/12/2020 and treated with COVID protocol.  Patient was unvaccinated for COVID-19.  Patient developed aspiration pneumonia, AKI requiring CRRT as well as ARDS resulting in prolonged ventilation, PAF, ileus, encephalopathy, recurrent fevers due  to multi resistant infection as well as developing DTI on skin scrotums/thigh and toes.  Patient required trach on 11/14/2020 and PEG placed on 11/21/2020.  Palliative care was consulted and family elected on full scope of care.  Patient tolerated extubation to ATC and was weaned to room air on 12/09/2018.  He tolerated plugging and was decannulated on 12/17/2020.  Swallow function has improved the patient continued to have issues with critical illness neuropathy/myopathy with decreased endurance, balance deficits and pain affecting mobility and ADLs.  Patient was laying in his bed slightly elevated when I entered the room.  He was alert and oriented.  Cognition appeared to be within normal limits.  The patient admitted to irritability and agitation with extended  hospital stay.  Patient reports that he remembers little of his early parts of his illness but did remember some very vivid and distressing dreams once his intubation had ended.  Patient reports that these dreams have diminished or stopped at this point.  Patient acknowledges frustration but understands need and is motivated to continue with therapeutic interventions to regain functionality.  Patient denies significant depression or anxiety at this point that is limiting his therapeutic efforts but does acknowledge agitation irritability more to do with frustration over his overall care.  Patient did report his willingness to get his COVID-19 vaccine at some point during his hospital course when appropriate.   Disposition/Plan:  Today we worked on coping and adjustment issues with extended hospital stay, critical illness neuropathy/myopathy  Diagnosis:    Debility        Electronically Signed   _______________________ Ilean Skill, Psy.D. Clinical Neuropsychologist

## 2020-12-24 NOTE — Progress Notes (Signed)
Occupational Therapy Session Note  Patient Details  Name: Jackson Figueroa MRN: [id number] Date of Birth: May 18, 1961  Today's Date: 12/24/2020 OT Individual Time: 0930-1030 OT Individual Time Calculation (min): 60 min    Short Term Goals: Week 1:  OT Short Term Goal 1 (Week 1): Pt will be able to tolerate standing in [place] stedy for at least 3 minutes to demonstrate improved endurance. OT Short Term Goal 2 (Week 1): Pt will be able to don and doff pants over hips using a bridge in bed from supine. OT Short Term Goal 3 (Week 1): Pt will be able to reach R arm back behind his hips to be able to adjust pants and increase potential for full cleansing. OT Short Term Goal 4 (Week 1): Pt will don shirt with min A.  Skilled Therapeutic Interventions/Progress Updates:    OT intervention with focus on bed mobility, sitting balance, RUE/hand function/strength, w/c mobility, and activity tolerance to increase independence with bADLs. Supine>sit EOB with supervision using bed functions and HOB at 20. Transfer to w/c with [name]. Pt engaged in table tasks with theraputty, clothes pins, and small beads. Pt with significant weakness in pincer grasp and intrinsic hand muscles. Pt returned to room and transferred back to bed with [name]. Pt remained in bed with all needs within reach.  Therapy Documentation Precautions:  Precautions Precautions: Fall Precaution Comments: PEG, sacral and toe wounds Restrictions Weight Bearing Restrictions: No   Pain:  Pt c/o neuropathic pain in Rt hand, primarily digit 5; premedicated     Therapy/Group: Individual Therapy  [name] 12/24/2020, 10:34 AM

## 2020-12-24 NOTE — Plan of Care (Signed)
  Problem: RH SKIN INTEGRITY Goal: RH STG SKIN FREE OF INFECTION/BREAKDOWN Description: Skin free of breakdown and infection with min assist to supervision assistance. Outcome: Progressing Goal: RH STG ABLE TO PERFORM INCISION/WOUND CARE W/ASSISTANCE Description: STG Able To Perform Incision/Wound Care With min Assistance to supervision assistance. Outcome: Progressing   Problem: RH KNOWLEDGE DEFICIT GENERAL Goal: RH STG INCREASE KNOWLEDGE OF SELF CARE AFTER HOSPITALIZATION Outcome: Progressing   Problem: Consults Goal: RH GENERAL PATIENT EDUCATION Description: See Patient Education module for education specifics. Outcome: Progressing Goal: Skin Care Protocol Initiated - if Braden Score 18 or less Description: If consults are not indicated, leave blank or document N/A Outcome: Progressing   

## 2020-12-24 NOTE — Progress Notes (Signed)
Occupational Therapy Session Note  Patient Details  Name: Jackson Figueroa MRN: [id number] Date of Birth: 06-Nov-1961  Today's Date: 12/24/2020 OT Individual Time: 1130-1200 OT Individual Time Calculation (min): 30 min    Short Term Goals: Week 1:  OT Short Term Goal 1 (Week 1): Pt will be able to tolerate standing in [place] stedy for at least 3 minutes to demonstrate improved endurance. OT Short Term Goal 2 (Week 1): Pt will be able to don and doff pants over hips using a bridge in bed from supine. OT Short Term Goal 3 (Week 1): Pt will be able to reach R arm back behind his hips to be able to adjust pants and increase potential for full cleansing. OT Short Term Goal 4 (Week 1): Pt will don shirt with min A.  Skilled Therapeutic Interventions/Progress Updates:    Pt resting in bed upon arrival. Transfer to w/c with [name] Plus. Supine>sit EOB with hospital functions at supervision with extra time. Pt completed UB bathing/dressing at w/c level. Pt used electric razor with Rt hand to complete shaving task. Pt able to reposition in w/c with reciprocal scooting. Pt remained seated in w/c with lunch tray on table in front. All needs within reach.  Therapy Documentation Precautions:  Precautions Precautions: Fall Precaution Comments: PEG, sacral and toe wounds Restrictions Weight Bearing Restrictions: No   Pain:  Pt denies pain this morning   Therapy/Group: Individual Therapy  [name] 12/24/2020, 12:15 PM

## 2020-12-25 MED ORDER — COVID-19 MRNA VAC-TRIS(PFIZER) 30 MCG/0.3ML IM SUSP
0.3000 mL | Freq: Once | INTRAMUSCULAR | Status: AC
  Administered 2020-12-27: 0.3 mL via INTRAMUSCULAR
  Filled 2020-12-25: qty 0.3

## 2020-12-25 NOTE — Progress Notes (Signed)
[facility] PHYSICAL MEDICINE & REHABILITATION PROGRESS NOTE   Subjective/Complaints:  Pt reports really wants to get COVID vaccine Friday-  Didn't sleep well- couldn't get comfortable- tossing and turning.  Per pt, backside doing well.     ROS: Pt denies SOB, abd pain, CP, N/V/C/D, and vision changes   Objective:   No results found. Recent Labs    12/23/20 0632  WBC 7.1  HGB 9.5*  HCT 29.0*  PLT 210   No results for input(s): NA, K, CL, CO2, GLUCOSE, BUN, CREATININE, CALCIUM in the last 72 hours.  Intake/Output Summary (Last 24 hours) at 12/25/2020 1513 Last data filed at 12/25/2020 1300 Gross per 24 hour  Intake 600 ml  Output 2100 ml  Net -1500 ml     Pressure Injury 11/18/20 Toe (Comment  which one) Anterior;Right Deep Tissue Pressure Injury - Purple or maroon localized area of discolored intact skin or blood-filled blister due to damage of underlying soft tissue from pressure and/or shear. DTI left  (Active)  11/18/20 2330  Location: Toe (Comment  which one) (LEFT big toe)  Location Orientation: Anterior;Right  Staging: Deep Tissue Pressure Injury - Purple or maroon localized area of discolored intact skin or blood-filled blister due to damage of underlying soft tissue from pressure and/or shear.  Wound Description (Comments): DTI left toe  Present on Admission: Yes (on admit to 59M)    Physical Exam: Vital Signs Blood pressure 118/75, pulse 94, temperature 97.9 F (36.6 C), temperature source Oral, resp. rate 18, height 6' (1.829 m), weight 110 kg, SpO2 99 %.  General: on BSC- getting cleaned up, NAD Mood and affect are appropriate Heart: RRR Lungs: CTA B/L- no W/R/R- good air movement Abdomen: Soft, NT, ND, (+)BS  Extremities: No clubbing, cyanosis, or edema R ulnar neuropathy- numbness in lateral 4th/5th digit on R with intrinsic atrophy  Skin:    General: Skin is warm and dry.     Comments: Sacrum discolored with evidence of dried MASD with few small  abraded areas-- extends to perineum--see picture below. Right great toe and right 2nd toe with dried blisters. Bilateral plantar surface with dry flaky skin.   Neurological: Ox3 Motor: Right upper extremity: Shoulder abduction 3+/5, distally 3/5 Left upper extremity: 4 -/5 proximal distal Bilateral lower extremities: Hip flexion, knee extension 2+/5, ankle dorsiflexion 4/5         Assessment/Plan: 1. Functional deficits which require 3+ hours per day of interdisciplinary therapy in a comprehensive inpatient rehab setting.  Physiatrist is providing close team supervision and 24 hour management of active medical problems listed below.  Physiatrist and rehab team continue to assess barriers to discharge/monitor patient progress toward functional and medical goals  Care Tool:  Bathing    Body parts bathed by patient: Right arm,Left arm,Chest,Abdomen,Front perineal area,Right upper leg,Left upper leg,Right lower leg,Left lower leg,Face   Body parts bathed by helper: Buttocks     Bathing assist Assist Level: Minimal Assistance - Patient > 75%     Upper Body Dressing/Undressing Upper body dressing   What is the patient wearing?: Pull over shirt    Upper body assist Assist Level: Supervision/Verbal cueing    Lower Body Dressing/Undressing Lower body dressing      What is the patient wearing?: Pants     Lower body assist Assist for lower body dressing: Moderate Assistance - Patient 50 - 74%     Toileting Toileting    Toileting assist Assist for toileting: Independent with assistive device Assistive Device Comment:  Urinal   Transfers Chair/bed transfer  Transfers assist  Chair/bed transfer activity did not occur: Safety/medical concerns (due to weakness/fatigue)  Chair/bed transfer assist level: Maximal Assistance - Patient 25 - 49% (slide board)     Locomotion Ambulation   Ambulation assist   Ambulation activity did not occur: Safety/medical concerns  (due to weakness/fatigue)          Walk 10 feet activity   Assist  Walk 10 feet activity did not occur: Safety/medical concerns (due to weakness/fatigue)        Walk 50 feet activity   Assist Walk 50 feet with 2 turns activity did not occur: Safety/medical concerns (due to weakness/fatigue)         Walk 150 feet activity   Assist Walk 150 feet activity did not occur: Safety/medical concerns (due to weakness/fatigue)         Walk 10 feet on uneven surface  activity   Assist Walk 10 feet on uneven surfaces activity did not occur: Safety/medical concerns (due to weakness/fatigue)         Wheelchair     Assist Will patient use wheelchair at discharge?: Yes Type of Wheelchair: Manual Wheelchair activity did not occur: Safety/medical concerns (due to weakness/fatigue)  Wheelchair assist level: Supervision/Verbal cueing Max wheelchair distance: 150'    Wheelchair 50 feet with 2 turns activity    Assist    Wheelchair 50 feet with 2 turns activity did not occur: Safety/medical concerns (due to weakness/fatigue)   Assist Level: Supervision/Verbal cueing   Wheelchair 150 feet activity     Assist  Wheelchair 150 feet activity did not occur: Safety/medical concerns (due to weakness/fatigue)   Assist Level: Supervision/Verbal cueing   Blood pressure 118/75, pulse 94, temperature 97.9 F (36.6 C), temperature source Oral, resp. rate 18, height 6' (1.829 m), weight 110 kg, SpO2 99 %.    Medical Problem List and Plan: 1.  Diffuse weakness with decreased endurance, balance deficits and pain affecting mobility and ADLs secondary to CIP/CIM- I.e ICU myopathy/neuropathy             -patient may shower             -ELOS/Goals: 27-30 days/supervision/min a             PT, OT CIR  2/1- team conference today to determine d/c date/plan  2/2- set for 4 weeks due to severe ICU myopathy AND neuropathy 2.  Antithrombotics: -DVT/anticoagulation:   Pharmaceutical: Lovenox             -antiplatelet therapy: N/A 3. Pain Management: tylenol prn  1/27- c/o pain in joints and HA's- will give tramadol 50 mg q6 hours prn  1/28- pt hadn't taken- asked nursing to give this AM  1/31- will start Lyrica 50 mg TID for nerve pain- since Gabapentin has more cognitive side effects, he wants to stay away from gabapentin.   2/1- has already taken the edge off of pain- con't regimen- no side effects so far 4. Mood: LCSW to follow for evaluation and support.              -antipsychotic agents: Seroquel 25 5. Neuropsych: This patient is capable of making decisions on his own behalf. 6. Skin/Wound Care:                          DTI with MASD: areas on buttocks improving.  Zinc for moisture barrier as well as pressure relief measures.  1/27- will con't wound care and also PEG flushes- will order   1/28- asked nurse to assess /d/w OT/PT to see if can do speciality bed  1/31- on specialty bed- low air loss mattress  2/1- will d/w staff the feeling it's not inflated, even though appears it is.  7. Fluids/Electrolytes/Nutrition: Monitor I/O. Encourage fluid intake. Continue to monitor lytes with serial checks.  8. Morbid obesity: Educate on importance of diet and weight loss to help promote overall health. Encourage side lying when in bed.  9. Pre-renal azotemia: Improving off tube feeds--encourage fluid intake.              CMP ordered for tomorrow.  1/29 BMET improving , mildly elevated BUN/Creat doing well off IVF  1/31- Cr down to 1.37 s/p IVFs -will remove IV and let pt work on drinking will recheck Thursday.  2/2- labs in AM  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              1/27- Hb 9.5- overall stable- con't regimen 11. Encephalopathy: Has resolved. Continue Seroquel to help with sleep hygeine.   2/2- slept poorly- might need to change meds if doesn't improve 12.  Dysphagia             D2 thins, advance diet as  tolerated  1/28- also has PEG_ he wants it removed, but don't think it's time yet- also not drinking enough- might need free water flushes?  1/31- will check when it's due to be removed.  2/2- PEG was placed 12/30- so cannot be removed til minimum 3/1  13. Recent trach  1/27- still has stoma that is healing- con't wound care.  2/2- healed over/closed- con't scar mobilization   14.  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  1/31- will start Lyrica 50 mg TID_ explained won't help numbness/weakness, only nerve pain.  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   LOS: 7 days A FACE TO FACE EVALUATION WAS PERFORMED  Cathern Tahir 12/25/2020, 3:13 PM

## 2020-12-25 NOTE — Progress Notes (Signed)
Physical Therapy Session Note  Patient Details  Name: Jackson Figueroa MRN: [id number] Date of Birth: [birth year]  Today's Date: 12/25/2020 PT Individual Time: 1000-1100; 1300-1415 PT Individual Time Calculation (min): 60 min and 75 min  Short Term Goals: Week 1:  PT Short Term Goal 1 (Week 1): Patient will be able to transition sit <> supine with CGA PT Short Term Goal 2 (Week 1): Patient will be able to transfer with LRAD and MaxA PT Short Term Goal 3 (Week 1): Patient will tolerate standing with LRAD for >1 min  Skilled Therapeutic Interventions/Progress Updates:    Session 1: Pt received seated in w/c in dayroom handed off from OT session. Agreeable to PT session. Pt reports 6/10 pain in R knee at rest, utilized repositioning and provided ice packs to B knees at end of session for pain management. Manual w/c propulsion x 150 ft with use of BUE at Supervision level with increased time needed due to fatigue and RUE weakness. Slide board transfer w/c to mat table with max A slightly uphill to the R. Sit to supine mod A for BLE management. Supine bridges 2 x 10 reps, SKFO x 10 reps, SAQ 2 x 10 reps, and AAROM heel slides 2 x 10 reps. Pt exhibits weakened quads B L>R and experiences R knee pain and L hip pain with SAQ. Pt exhibits compensatory hip flexor activation during LLE SAQ due to quad weakness. Pt exhibits decreased R patellar mobility and some tenderness to touch superior to fibular head. Supine to/from sidelying at Supervision level. Sidelying R/L clamshells 2 x 10 reps. Supine to sitting EOM with mod A needed for trunk elevation. Slide board transfer mat table to w/c going downhill with CGA. Pt requests to return to bed at end of session. [name] transfer w/c to bed. Sit to supine mod A for BLE management. Pt left semi-reclined in bed with needs in reach, ice packs to B knees at end of session.  Session 2: Pt received supine in bed, agreeable to PT session. Pt reports ongoing soreness in B knees  this PM, not rated and declines intervention. Supine to sit with mod A for trunk elevation, use of bedrail. [name] transfer bed to w/c. Manual w/c propulsion x 100 ft with use of BUE at Supervision level. Slide board transfer w/c to mat table with max A to initiate then CGA. Session focus on use of [name] Plus walking sling for standing and taking steps. Attempt to perform gait with [name] Plus walking sling but B knees buckle without support from knee guard. Forward/backward stepping with alt LE, 3 x 10 reps with seated rest break between each bout. Pt reports onset of L knee pain and BLE fatigue with standing activity. Slide board transfer back to w/c with CGA. Seated RUE coordination task performing Wii bowling game. Pt requests to return to bed at end of session. [name] transfer back to bed. Pt left seated EOB with needs in reach, girlfriend present at end of session.   Therapy Documentation Precautions:  Precautions Precautions: Fall Precaution Comments: PEG, sacral and toe wounds Restrictions Weight Bearing Restrictions: No   Therapy/Group: Individual Therapy   Excell Seltzer, PT, DPT  12/25/2020, 12:24 PM

## 2020-12-25 NOTE — Progress Notes (Signed)
Occupational Therapy Weekly Progress Note  Patient Details  Name: Jackson Figueroa MRN: [id number] Date of Birth: Feb 17, 1961  Beginning of progress report period: December 19, 2020 End of progress report period: December 25, 2020  Patient has met 1 of 4 short term goals. Pt has made slow but steady progress with bed mobility, BADLs, and BUE/hand function since his admission. Supine>sit EOB using hospital bed functions with CGA. Sitting balance EOB/EOM with supervision. Bathing/dressing w/c level with min A for UB dressing and max A for LB dressing. Pt requires assistance bathing buttocks when standing in [employer].Pt requires assistance pulling shirt over trunk 50% of time. BSC transfers with [name] Plus. Pt dependent/tot A for toileting tasks. Pt able to self feed with foam tubing on utensils. RUE/hand at diminished level.   Patient continues to demonstrate the following deficits: muscle weakness, decreased cardiorespiratoy endurance, impaired timing and sequencing, unbalanced muscle activation, decreased coordination and decreased motor planning and decreased sitting balance, decreased standing balance, decreased postural control and decreased balance strategies and therefore will continue to benefit from skilled OT intervention to enhance overall performance with BADL and Reduce care partner burden.  Patient progressing toward long term goals..  Continue plan of care.  OT Short Term Goals Week 1:  OT Short Term Goal 1 (Week 1): Pt will be able to tolerate standing in [place] stedy for at least 3 minutes to demonstrate improved endurance. OT Short Term Goal 1 - Progress (Week 1): Progressing toward goal OT Short Term Goal 2 (Week 1): Pt will be able to don and doff pants over hips using a bridge in bed from supine. OT Short Term Goal 2 - Progress (Week 1): Progressing toward goal OT Short Term Goal 3 (Week 1): Pt will be able to reach R arm back behind his hips to be able to adjust pants and increase  potential for full cleansing. OT Short Term Goal 3 - Progress (Week 1): Progressing toward goal OT Short Term Goal 4 (Week 1): Pt will don shirt with min A. OT Short Term Goal 4 - Progress (Week 1): Met Week 2:  OT Short Term Goal 1 (Week 2): Pt will be able to tolerate standing in [place] stedy for at least 3 minutes to demonstrate improved endurance. OT Short Term Goal 2 (Week 2): Pt will be able to don and doff pants over hips using a bridge in bed from supine. OT Short Term Goal 3 (Week 2): Pt will be able to reach R arm back behind his hips to be able to adjust pants and increase potential for full cleansing.   [name] [facility] 12/25/2020, 6:48 AM

## 2020-12-25 NOTE — Progress Notes (Signed)
Patient ID: Jackson Figueroa, male   DOB: 05-Oct-1961, 60 y.o.   MRN: [id number]  SW faxed FMLA forms to [employer] (p:[phone]/f:[phone]). SW provided pt with original forms.   Loralee Pacas, MSW, [place] Office: [phone] Cell: [phone] Fax: [phone]

## 2020-12-25 NOTE — Progress Notes (Signed)
Occupational Therapy Session Note  Patient Details  Name: Jackson Figueroa MRN: [id number] Date of Birth: 12-22-1960  Today's Date: 12/25/2020 OT Individual Time: 0900-1000 OT Individual Time Calculation (min): 60 min    Short Term Goals: Week 2:  OT Short Term Goal 1 (Week 2): Pt will be able to tolerate standing in [place] stedy for at least 3 minutes to demonstrate improved endurance. OT Short Term Goal 2 (Week 2): Pt will be able to don and doff pants over hips using a bridge in bed from supine. OT Short Term Goal 3 (Week 2): Pt will be able to reach R arm back behind his hips to be able to adjust pants and increase potential for full cleansing.  Skilled Therapeutic Interventions/Progress Updates:    OT intervention with focus on bed mobility, grooming at sink, BUE activities to increase strength and mobility. Pt required min A for supine>sit EOB. Transfer with [name]. Grooming without assistance. Pt engaged in ball tapping activity with 1#and 2# bar incorporating trunk rotation and increased shoulder flexion. BUE PROM performed to increase ROM. No pain reported. Pt unable to perform chest presses with 1# bar. Pt remained in w/c awaiting PT session.   Therapy Documentation Precautions:  Precautions Precautions: Fall Precaution Comments: PEG, sacral and toe wounds Restrictions Weight Bearing Restrictions: No   Pain: Pain Assessment Pain Scale: 0-10 Pain Score: 5  Pain Intervention(s): Repositioned   Therapy/Group: Individual Therapy  [name] 12/25/2020, 12:24 PM

## 2020-12-26 LAB — BASIC METABOLIC PANEL
Anion gap: 8 (ref 5–15)
BUN: 29 mg/dL — ABNORMAL HIGH (ref 6–20)
CO2: 25 mmol/L (ref 22–32)
Calcium: 8.9 mg/dL (ref 8.9–10.3)
Chloride: 105 mmol/L (ref 98–111)
Creatinine, Ser: 1.48 mg/dL — ABNORMAL HIGH (ref 0.61–1.24)
GFR, Estimated: 55 mL/min — ABNORMAL LOW (ref >60)
Glucose, Bld: 90 mg/dL (ref 70–99)
Potassium: 4.1 mmol/L (ref 3.5–5.1)
Sodium: 138 mmol/L (ref 135–145)

## 2020-12-26 NOTE — Progress Notes (Signed)
[facility] PHYSICAL MEDICINE & REHABILITATION PROGRESS NOTE   Subjective/Complaints:  Pt having BM on toilet again this AM.  Lyrica is helping nerve pain in R hand, but still numb, esp in R 5th digit-   Cr 1.48 and BUN 29- can tell not pushing fluids as much.    ROS: Pt denies SOB, abd pain, CP, N/V/C/D, and vision changes   Objective:   No results found. No results for input(s): WBC, HGB, HCT, PLT in the last 72 hours. Recent Labs    12/26/20 0459  NA 138  K 4.1  CL 105  CO2 25  GLUCOSE 90  BUN 29*  CREATININE 1.48*  CALCIUM 8.9    Intake/Output Summary (Last 24 hours) at 12/26/2020 3267 Last data filed at 12/26/2020 0700 Gross per 24 hour  Intake 780 ml  Output 3000 ml  Net -2220 ml     Pressure Injury 11/18/20 Toe (Comment  which one) Anterior;Right Deep Tissue Pressure Injury - Purple or maroon localized area of discolored intact skin or blood-filled blister due to damage of underlying soft tissue from pressure and/or shear. DTI left  (Active)  11/18/20 2330  Location: Toe (Comment  which one) (LEFT big toe)  Location Orientation: Anterior;Right  Staging: Deep Tissue Pressure Injury - Purple or maroon localized area of discolored intact skin or blood-filled blister due to damage of underlying soft tissue from pressure and/or shear.  Wound Description (Comments): DTI left toe  Present on Admission: Yes (on admit to 36M)    Physical Exam: Vital Signs Blood pressure 99/66, pulse 77, temperature 98.5 F (36.9 C), resp. rate 18, height 6' (1.829 m), weight 110 kg, SpO2 93 %.  General: awake, alert, on BSC with OT in room, NAD Mood and affect are appropriate Heart: RRR Lungs: CTA B/L- no W/R/R- good air movement Abdomen: Soft, NT, ND, (+)BS  Extremities: No clubbing, cyanosis, or edema R ulnar neuropathy- numbness in lateral 4th/5th digit (more in 5th digit numbness) on R with intrinsic atrophy  Skin:    General: Skin is warm and dry.     Comments: Sacrum  discolored with evidence of dried MASD with few small abraded areas-- extends to perineum--see picture below. Right great toe and right 2nd toe with dried blisters. Bilateral plantar surface with dry flaky skin.   Neurological: Ox3 Motor: Right upper extremity: Shoulder abduction 3+/5, distally 3/5 Left upper extremity: 4 -/5 proximal distal Bilateral lower extremities: Hip flexion, knee extension 2+/5, ankle dorsiflexion 4/5         Assessment/Plan: 1. Functional deficits which require 3+ hours per day of interdisciplinary therapy in a comprehensive inpatient rehab setting.  Physiatrist is providing close team supervision and 24 hour management of active medical problems listed below.  Physiatrist and rehab team continue to assess barriers to discharge/monitor patient progress toward functional and medical goals  Care Tool:  Bathing    Body parts bathed by patient: Right arm,Left arm,Chest,Abdomen,Front perineal area,Right upper leg,Left upper leg,Right lower leg,Left lower leg,Face   Body parts bathed by helper: Buttocks     Bathing assist Assist Level: Minimal Assistance - Patient > 75%     Upper Body Dressing/Undressing Upper body dressing   What is the patient wearing?: Pull over shirt    Upper body assist Assist Level: Supervision/Verbal cueing    Lower Body Dressing/Undressing Lower body dressing      What is the patient wearing?: Pants     Lower body assist Assist for lower body dressing: Moderate Assistance -  Patient 50 - 74%     Toileting Toileting    Toileting assist Assist for toileting: Independent with assistive device Assistive Device Comment: Urinal   Transfers Chair/bed transfer  Transfers assist  Chair/bed transfer activity did not occur: Safety/medical concerns (due to weakness/fatigue)  Chair/bed transfer assist level: Maximal Assistance - Patient 25 - 49% (slide board)     Locomotion Ambulation   Ambulation assist    Ambulation activity did not occur: Safety/medical concerns (due to weakness/fatigue)          Walk 10 feet activity   Assist  Walk 10 feet activity did not occur: Safety/medical concerns (due to weakness/fatigue)        Walk 50 feet activity   Assist Walk 50 feet with 2 turns activity did not occur: Safety/medical concerns (due to weakness/fatigue)         Walk 150 feet activity   Assist Walk 150 feet activity did not occur: Safety/medical concerns (due to weakness/fatigue)         Walk 10 feet on uneven surface  activity   Assist Walk 10 feet on uneven surfaces activity did not occur: Safety/medical concerns (due to weakness/fatigue)         Wheelchair     Assist Will patient use wheelchair at discharge?: Yes Type of Wheelchair: Manual Wheelchair activity did not occur: Safety/medical concerns (due to weakness/fatigue)  Wheelchair assist level: Supervision/Verbal cueing Max wheelchair distance: 150'    Wheelchair 50 feet with 2 turns activity    Assist    Wheelchair 50 feet with 2 turns activity did not occur: Safety/medical concerns (due to weakness/fatigue)   Assist Level: Supervision/Verbal cueing   Wheelchair 150 feet activity     Assist  Wheelchair 150 feet activity did not occur: Safety/medical concerns (due to weakness/fatigue)   Assist Level: Supervision/Verbal cueing   Blood pressure 99/66, pulse 77, temperature 98.5 F (36.9 C), resp. rate 18, height 6' (1.829 m), weight 110 kg, SpO2 93 %.    Medical Problem List and Plan: 1.  Diffuse weakness with decreased endurance, balance deficits and pain affecting mobility and ADLs secondary to CIP/CIM- I.e ICU myopathy/neuropathy             -patient may shower             -ELOS/Goals: 27-30 days/supervision/min a             PT, OT CIR  2/1- team conference today to determine d/c date/plan  2/2- set for 4 weeks due to severe ICU myopathy AND neuropathy 2.   Antithrombotics: -DVT/anticoagulation:  Pharmaceutical: Lovenox             -antiplatelet therapy: N/A 3. Pain Management: tylenol prn  1/27- c/o pain in joints and HA's- will give tramadol 50 mg q6 hours prn  1/28- pt hadn't taken- asked nursing to give this AM  1/31- will start Lyrica 50 mg TID for nerve pain- since Gabapentin has more cognitive side effects, he wants to stay away from gabapentin.   2/1- has already taken the edge off of pain- con't regimen- no side effects so far  2/3- Lyrica helping pain significant amount- con't Lyrica 4. Mood: LCSW to follow for evaluation and support.              -antipsychotic agents: Seroquel 25 5. Neuropsych: This patient is capable of making decisions on his own behalf. 6. Skin/Wound Care:  DTI with MASD: areas on buttocks improving.  Zinc for moisture barrier as well as pressure relief measures.   1/27- will con't wound care and also PEG flushes- will order   1/28- asked nurse to assess /d/w OT/PT to see if can do speciality bed  1/31- on specialty bed- low air loss mattress  2/1- will d/w staff the feeling it's not inflated, even though appears it is.  7. Fluids/Electrolytes/Nutrition: Monitor I/O. Encourage fluid intake. Continue to monitor lytes with serial checks.  8. Morbid obesity: Educate on importance of diet and weight loss to help promote overall health. Encourage side lying when in bed.  9. Pre-renal azotemia: Improving off tube feeds--encourage fluid intake.              CMP ordered for tomorrow.  1/29 BMET improving , mildly elevated BUN/Creat doing well off IVF  1/31- Cr down to 1.37 s/p IVFs -will remove IV and let pt work on drinking will recheck Thursday.  2/2- labs in AM   2/3- Cr up to 1.48/BUN 29- needs more fluids- will d/w pt doing free water flushes through PEG since Na is 138.  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              1/27- Hb 9.5- overall stable- con't  regimen 11. Encephalopathy: Has resolved. Continue Seroquel to help with sleep hygeine.   2/2- slept poorly- might need to change meds if doesn't improve 12.  Dysphagia             D2 thins, advance diet as tolerated  1/28- also has PEG_ he wants it removed, but don't think it's time yet- also not drinking enough- might need free water flushes?  1/31- will check when it's due to be removed.  2/2- PEG was placed 12/30- so cannot be removed til minimum 3/1  13. Recent trach  1/27- still has stoma that is healing- con't wound care.  2/2- healed over/closed- con't scar mobilization   14.  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  1/31- will start Lyrica 50 mg TID_ explained won't help numbness/weakness, only nerve pain.  2/1- is already helping- con't regimen   2/3- Lyrica is really helping- con't regimen 15. Long COVID  2/2- asking for COVID vaccine- will order for Friday [place].   2/3- will get tomorrow- explained since has had COVID/long COVID, can have significant side effects for a few days.   LOS: 8 days A FACE TO FACE EVALUATION WAS PERFORMED  Callie Bunyard 12/26/2020, 9:09 AM

## 2020-12-26 NOTE — Progress Notes (Signed)
Occupational Therapy Session Note  Patient Details  Name: Jackson Figueroa MRN: [id number] Date of Birth: [birth year]  Today's Date: 12/26/2020 OT Individual Time: 1300-1430 OT Individual Time Calculation (min): 90 min    Short Term Goals: Week 2:  OT Short Term Goal 1 (Week 2): Pt will be able to tolerate standing in [place] stedy for at least 3 minutes to demonstrate improved endurance. OT Short Term Goal 2 (Week 2): Pt will be able to don and doff pants over hips using a bridge in bed from supine. OT Short Term Goal 3 (Week 2): Pt will be able to reach R arm back behind his hips to be able to adjust pants and increase potential for full cleansing.  Skilled Therapeutic Interventions/Progress Updates:    OT intervention with focus on sitting balance during BADLs-cutting beard, cutting hair, bathing at shower level in rolling shower chair, and sit<>stand with [name] Plus. Pt sitting EOB upon arrival. Transfer to w/c with [name]. Pt trimmed beard with hair clippers and assisted with trimming hair with hair clippers. Pt transferred to rolling shower chair with [name] Plus. Pt completed bathing tasks seated unsupported in shower and able to wash all body parts except buttocks. Pt exhibited adequate hip/trunk flexion to reach to BLE/feet to bathe and dry. Pt washed hair after shampoo applied. Pt returned to room and transferred to EOB. Sit>supine with supervision. Pt able to reposition independently after bed placed in Trendelenberg. Pt remained in bed with all needs within reach.  Therapy Documentation Precautions:  Precautions Precautions: Fall Precaution Comments: PEG, sacral and toe wounds Restrictions Weight Bearing Restrictions: No   Pain:  Pt denies pain this afternoon but c/o "butt soreness" after sitting on shower chair for shower; repositioned in bed   Therapy/Group: Individual Therapy  [name] 12/26/2020, 2:49 PM

## 2020-12-26 NOTE — Progress Notes (Signed)
Physical Therapy Session Note  Patient Details  Name: Jackson Figueroa MRN: [id number] Date of Birth: [birth year]  Today's Date: 12/26/2020 PT Individual Time: 0805-0900 and 1445-1535  PT Individual Time Calculation (min): 55 min and 50 min  Short Term Goals: Week 1:  PT Short Term Goal 1 (Week 1): Patient will be able to transition sit <> supine with CGA PT Short Term Goal 2 (Week 1): Patient will be able to transfer with LRAD and MaxA PT Short Term Goal 3 (Week 1): Patient will tolerate standing with LRAD for >1 min  Skilled Therapeutic Interventions/Progress Updates: Pt presented in [facility] with NT and nsg present preparing to transfer to [facility]. Pt missed 15 min skilled PT due to toileting/nsg care. Once pt back at EOB PTA obtained scrubs with pt attempting to lay across bed and pull pants over hips. After a failed attempt PTA suggested pt return to proper sidelying and perform rolling which pt was agreeable to. Pt required modA for BLE to return to supine and required minA when rolling to pull pants over hips. Performed sidelying to sit with minA for truncal support and use of bed features. Use of Denna Haggard to transfer pt to w/c. Pt performed oral hygiene with supervision and once completed propelled to day room with increased time/effort. Participated in Cybex Kinetron 70cm/sec for general BLE strengthening (footplate angles changed for increased emphasis on hamstring/glute activation) 10 cycles x 8 with rest breaks between bouts. Pt transported back to room and requesting to rinse out shorts that were soiled earlier. Pt worked on upright sitting balance away from back of w/c and use of R hand to rinse and squeeze out shorts. Pt encouraged to sit up in w/c until [place] RT arrived and can return to bed after session but pt declined. Use of [name] to return to bed and required minA for BLE onto bed. PTA blocked B feet and pt was able to boost self up to [facility]. Pt left in bed at end of session with call bell  within reach and needs met.   Tx2: Pt presented in bed agreeable to therapy. Pt denies pain at rest but c/o "discomfort or weakness" with wt bearing positions. Pt performed supine to sit with minA for truncal support and performed Denna Haggard transfer to w/c. PTA donned sock/shoes total A for time management. Pt transported to ortho gym and participated in standing frame x 6 min. With trunk flexed on front tray pt was able to perform several bouts of TKE to fatigue (~5 reps). Pt also alternated time between fully upright and leaning on tray. Pt returned to sitting when L knee became unconfortable but pt indicated no significant increase. Pt participated in [place] L2 x 3 min for general endurance. Pt required ace bandage to R hand to maintain on handle. Pt transported back to room and performed Denna Haggard. Pt was able to return to supine with CGA and increased time/effort. Pt was able to scoot to [facility] with bed in Trendelenburg. Pt left in bed with call bell within reach and needs met.      Therapy Documentation Precautions:  Precautions Precautions: Fall Precaution Comments: PEG, sacral and toe wounds Restrictions Weight Bearing Restrictions: No General:   Vital Signs:   Pain:   Mobility:   Locomotion :    Trunk/Postural Assessment :    Balance:   Exercises:   Other Treatments:      Therapy/Group: Individual Therapy  Taffie Eckmann 12/26/2020, 4:17 PM

## 2020-12-26 NOTE — Evaluation (Signed)
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 Allergic   . Knee pain, bilateral    due to old injuires  . Numbness and tingling in right hand    and elbow due to sports injury  . Obesity   . Staph infection    of appy wound--treated with antibiotics X 4 weeks.   . Tendinitis of elbow    right   Past Surgical History:       Past Surgical History:  Procedure Laterality Date  . APPENDECTOMY  1982  . INGUINAL HERNIA REPAIR Left 2000  . IR GASTROSTOMY TUBE MOD SED  11/21/2020  . KNEE ARTHROSCOPY     bilateral knees  . TONSILLECTOMY AND ADENOIDECTOMY    . TRACHEOSTOMY TUBE PLACEMENT N/A 11/14/2020   Procedure: TRACHEOSTOMY;  Surgeon: Melida Quitter, MD;  Location: [facility];  Service: ENT;  Laterality: N/A;    Assessment & Plan Clinical Impression: Patient is a 60 y.o. year old male with h/o morbid obesity who was originally admitted with Covid PNA with septic shock on 10/12/2020 and treated with Covid protocol. History from chart review and patient--has not been vaccinated against Covid. Hospital course significant for aspiration pneumonia due to stenotrophomonas Maltophilia, AKI requiring CRRT as well as ARDS resulting in prolonged ventilation, PAF, ileus, encephalopathy, recurrent fevers due to multiresistent stenotrophomonas-->last treated with minocycline as well as development of DTI on sacrum/thigh and toes. He required tracheostomy on 11/14/2020 by Dr. Redmond Baseman and PEG placed on 11/21/2020 by Dr. Laurence Ferrari. Palliative care consulted to discuss goals of care and family elected on full scope of care.   He tolerated extubation to ATC, was  weaned to room air on 12/09/2020. He tolerated plugging and was decannulated on 12/17/2020. Swallow function has improved and he has been advanced to dysphagia #2, thin liquids with use of aspiration precautions. 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 Therapy ongoing and working on endurance with sitting in a chair for an hour as well as trials in [place] to attempt weight bearing on BLE for 90 seconds. CIR recommended due to functional decline and severe deconditioning.   Pt presents with decreased activity tolerance, decreased functional mobility, decreased balance Limiting pt's independence with leisure/community pursuits.  Met with pt today per team referral to discuss leisure/community interests, introduce activity analysis/modifications, stress management and relaxation strategies.      Plan  Min 1 TR session >20 minutes per week during LOS  Recommendations for other services: None   Discharge Criteria: Patient will be discharged from TR if patient refuses treatment 3 consecutive times without medical reason.  If treatment goals not met, if there is a change in medical status, if patient makes no progress towards goals or if patient is discharged from hospital.  The above assessment, treatment plan, treatment alternatives and goals were discussed and mutually agreed upon: by patient  [place] 12/26/2020, 8:46 AM

## 2020-12-27 NOTE — Progress Notes (Signed)
Physical Therapy Weekly Progress Note  Patient Details  Name: Jackson Figueroa MRN: [id number] Date of Birth: 1961-06-30  Beginning of progress report period: December 19, 2020 End of progress report period: December 27, 2020  Patient has met 1 of 3 short term goals.  Pt has made some gains during current course of therapy. Pt has significant weakness in trunk and BUE thus continuing to require minA for truncal support for supine to sit but is able with increased time and effort to perform sit to supine with CGA. Pt has been unable to stand with RW but has tolerated standing up to 6 min in standing frame and has initiated wt shifting in [place]. Pt is limited by pain in B knees and L hip during wt bearing and significant weakness and deconditioning.   Patient continues to demonstrate the following deficits muscle weakness and muscle joint tightness and decreased cardiorespiratoy endurance and therefore will continue to benefit from skilled PT intervention to increase functional independence with mobility.  Patient progressing toward long term goals..  Continue plan of care.  PT Short Term Goals Week 1:  PT Short Term Goal 1 (Week 1): Patient will be able to transition sit <> supine with CGA PT Short Term Goal 1 - Progress (Week 1): Progressing toward goal PT Short Term Goal 2 (Week 1): Patient will be able to transfer with LRAD and MaxA PT Short Term Goal 2 - Progress (Week 1): Met PT Short Term Goal 3 (Week 1): Patient will tolerate standing with LRAD for >1 min PT Short Term Goal 3 - Progress (Week 1): Partly met Week 2:  PT Short Term Goal 1 (Week 2): Pt will tolerate standing x 5 min with LRAD PT Short Term Goal 2 (Week 2): Pt will complete least restrictive transfer with mod A consistently PT Short Term Goal 3 (Week 2): Pt will initiate gait training as safe and able  Skilled Therapeutic Interventions/Progress Updates:  Ambulation/gait training;Discharge planning;Functional mobility  training;Psychosocial support;Therapeutic Activities;Visual/perceptual remediation/compensation;Balance/vestibular training;Disease management/prevention;Neuromuscular re-education;Skin care/wound management;Therapeutic Exercise;Wheelchair propulsion/positioning;Cognitive remediation/compensation;DME/adaptive equipment instruction;Pain management;Splinting/orthotics;UE/LE Strength taining/ROM;Community reintegration;Functional electrical stimulation;Patient/family education;Stair training;UE/LE Coordination activities   Therapy Documentation Precautions:  Precautions Precautions: Fall Precaution Comments: PEG, sacral and toe wounds Restrictions Weight Bearing Restrictions: No   Therapy/Group: Individual Therapy   Excell Seltzer, PT, DPT 12/28/2020, 5:17 PM

## 2020-12-27 NOTE — Progress Notes (Signed)
[facility] PHYSICAL MEDICINE & REHABILITATION PROGRESS NOTE   Subjective/Complaints:  Pt reports still having joint pains- age issues per pt.  LBM yesterday- doing well- denies constipation.     ROS:  Pt denies SOB, abd pain, CP, N/V/C/D, and vision changes    Objective:   No results found. No results for input(s): WBC, HGB, HCT, PLT in the last 72 hours. Recent Labs    12/26/20 0459  NA 138  K 4.1  CL 105  CO2 25  GLUCOSE 90  BUN 29*  CREATININE 1.48*  CALCIUM 8.9    Intake/Output Summary (Last 24 hours) at 12/27/2020 0835 Last data filed at 12/27/2020 0745 Gross per 24 hour  Intake 560 ml  Output 2700 ml  Net -2140 ml     Pressure Injury 11/18/20 Toe (Comment  which one) Anterior;Right Deep Tissue Pressure Injury - Purple or maroon localized area of discolored intact skin or blood-filled blister due to damage of underlying soft tissue from pressure and/or shear. DTI left  (Active)  11/18/20 2330  Location: Toe (Comment  which one) (LEFT big toe)  Location Orientation: Anterior;Right  Staging: Deep Tissue Pressure Injury - Purple or maroon localized area of discolored intact skin or blood-filled blister due to damage of underlying soft tissue from pressure and/or shear.  Wound Description (Comments): DTI left toe  Present on Admission: Yes (on admit to 31M)    Physical Exam: Vital Signs Blood pressure 100/67, pulse 76, temperature 97.8 F (36.6 C), resp. rate 18, height 6' (1.829 m), weight 110 kg, SpO2 95 %.  General: awake, alert, working with OT- sitting EOB, NAD Mood and affect are appropriate/calm Heart: RRR - no JVD Lungs: CTA B/L- no W/R/R- good air movement Abdomen: Soft, NT, ND, (+)BS   Extremities: No clubbing, cyanosis, or edema R ulnar neuropathy- numbness in lateral 4th/5th digit (more in 5th digit numbness) on R with intrinsic atrophy  Skin:    General: Skin is warm and dry.     Comments: Sacrum discolored with evidence of dried MASD with  few small abraded areas-- extends to perineum--see picture below. Right great toe and right 2nd toe with dried blisters. Bilateral plantar surface with dry flaky skin.   Neurological: Ox3 Motor: Right upper extremity: Shoulder abduction 3+/5, distally 3/5 Left upper extremity: 4 -/5 proximal distal Bilateral lower extremities: Hip flexion, knee extension 2+/5, ankle dorsiflexion 4/5         Assessment/Plan: 1. Functional deficits which require 3+ hours per day of interdisciplinary therapy in a comprehensive inpatient rehab setting.  Physiatrist is providing close team supervision and 24 hour management of active medical problems listed below.  Physiatrist and rehab team continue to assess barriers to discharge/monitor patient progress toward functional and medical goals  Care Tool:  Bathing    Body parts bathed by patient: Right arm,Left arm,Chest,Abdomen,Front perineal area,Right upper leg,Left upper leg,Right lower leg,Left lower leg,Face   Body parts bathed by helper: Buttocks     Bathing assist Assist Level: Minimal Assistance - Patient > 75%     Upper Body Dressing/Undressing Upper body dressing   What is the patient wearing?: Pull over shirt    Upper body assist Assist Level: Supervision/Verbal cueing    Lower Body Dressing/Undressing Lower body dressing      What is the patient wearing?: Pants     Lower body assist Assist for lower body dressing: Moderate Assistance - Patient 50 - 74%     Toileting Toileting    Toileting assist  Assist for toileting: Independent with assistive device Assistive Device Comment: Urinal   Transfers Chair/bed transfer  Transfers assist  Chair/bed transfer activity did not occur: Safety/medical concerns (due to weakness/fatigue)  Chair/bed transfer assist level: Maximal Assistance - Patient 25 - 49% (slide board)     Locomotion Ambulation   Ambulation assist   Ambulation activity did not occur: Safety/medical  concerns (due to weakness/fatigue)          Walk 10 feet activity   Assist  Walk 10 feet activity did not occur: Safety/medical concerns (due to weakness/fatigue)        Walk 50 feet activity   Assist Walk 50 feet with 2 turns activity did not occur: Safety/medical concerns (due to weakness/fatigue)         Walk 150 feet activity   Assist Walk 150 feet activity did not occur: Safety/medical concerns (due to weakness/fatigue)         Walk 10 feet on uneven surface  activity   Assist Walk 10 feet on uneven surfaces activity did not occur: Safety/medical concerns (due to weakness/fatigue)         Wheelchair     Assist Will patient use wheelchair at discharge?: Yes Type of Wheelchair: Manual Wheelchair activity did not occur: Safety/medical concerns (due to weakness/fatigue)  Wheelchair assist level: Supervision/Verbal cueing Max wheelchair distance: 150'    Wheelchair 50 feet with 2 turns activity    Assist    Wheelchair 50 feet with 2 turns activity did not occur: Safety/medical concerns (due to weakness/fatigue)   Assist Level: Supervision/Verbal cueing   Wheelchair 150 feet activity     Assist  Wheelchair 150 feet activity did not occur: Safety/medical concerns (due to weakness/fatigue)   Assist Level: Supervision/Verbal cueing   Blood pressure 100/67, pulse 76, temperature 97.8 F (36.6 C), resp. rate 18, height 6' (1.829 m), weight 110 kg, SpO2 95 %.    Medical Problem List and Plan: 1.  Diffuse weakness with decreased endurance, balance deficits and pain affecting mobility and ADLs secondary to CIP/CIM- I.e ICU myopathy/neuropathy             -patient may shower             -ELOS/Goals: 27-30 days/supervision/min a             PT, OT CIR  2/1- team conference today to determine d/c date/plan  2/2- set for 4 weeks due to severe ICU myopathy AND neuropathy 2.  Antithrombotics: -DVT/anticoagulation:  Pharmaceutical: Lovenox              -antiplatelet therapy: N/A 3. Pain Management: tylenol prn  1/27- c/o pain in joints and HA's- will give tramadol 50 mg q6 hours prn  1/28- pt hadn't taken- asked nursing to give this AM  1/31- will start Lyrica 50 mg TID for nerve pain- since Gabapentin has more cognitive side effects, he wants to stay away from gabapentin.   2/1- has already taken the edge off of pain- con't regimen- no side effects so far  2/3- Lyrica helping pain significant amount- con't Lyrica  2/4 having joint pain this AM esp due to weather- con't tramadol/Lyrica for nerve pain and tylenol prn 4. Mood: LCSW to follow for evaluation and support.              -antipsychotic agents: Seroquel 25 5. Neuropsych: This patient is capable of making decisions on his own behalf. 6. Skin/Wound Care:  DTI with MASD: areas on buttocks improving.  Zinc for moisture barrier as well as pressure relief measures.   1/27- will con't wound care and also PEG flushes- will order   1/28- asked nurse to assess /d/w OT/PT to see if can do speciality bed  1/31- on specialty bed- low air loss mattress  2/1- will d/w staff the feeling it's not inflated, even though appears it is.   2/4- tolerating specialty bed- con't regimen for backside 7. Fluids/Electrolytes/Nutrition: Monitor I/O. Encourage fluid intake. Continue to monitor lytes with serial checks.  8. Morbid obesity: Educate on importance of diet and weight loss to help promote overall health. Encourage side lying when in bed.  9. Pre-renal azotemia: Improving off tube feeds--encourage fluid intake.              CMP ordered for tomorrow.  1/29 BMET improving , mildly elevated BUN/Creat doing well off IVF  1/31- Cr down to 1.37 s/p IVFs -will remove IV and let pt work on drinking will recheck Thursday.  2/2- labs in AM   2/3- Cr up to 1.48/BUN 29- needs more fluids- will d/w pt doing free water flushes through PEG since Na is 138.  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              1/27- Hb 9.5- overall stable- con't regimen 11. Encephalopathy: Has resolved. Continue Seroquel to help with sleep hygeine.   2/2- slept poorly- might need to change meds if doesn't improve 12.  Dysphagia             D2 thins, advance diet as tolerated  1/28- also has PEG_ he wants it removed, but don't think it's time yet- also not drinking enough- might need free water flushes?  1/31- will check when it's due to be removed.  2/2- PEG was placed 12/30- so cannot be removed til minimum 3/1  13. Recent trach  1/27- still has stoma that is healing- con't wound care.  2/2- healed over/closed- con't scar mobilization   14.  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  1/31- will start Lyrica 50 mg TID_ explained won't help numbness/weakness, only nerve pain.  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   2/3- will get tomorrow- explained since has had COVID/long COVID, can have significant side effects for a few days.  2/4- hopefully will get COVID vaccine today   LOS: 9 days A FACE TO FACE EVALUATION WAS PERFORMED  Jackson Figueroa 12/27/2020, 8:35 AM

## 2020-12-27 NOTE — Progress Notes (Signed)
Physical Therapy Session Note  Patient Details  Name: Jackson Figueroa MRN: [id number] Date of Birth: 09/26/1961  Today's Date: 12/27/2020 PT Individual Time: 4696-2952 and 8413-2440 PT Individual Time Calculation (min): 68 min and 75 min  Short Term Goals: Week 1:  PT Short Term Goal 1 (Week 1): Patient will be able to transition sit <> supine with CGA PT Short Term Goal 1 - Progress (Week 1): Progressing toward goal PT Short Term Goal 2 (Week 1): Patient will be able to transfer with LRAD and MaxA PT Short Term Goal 2 - Progress (Week 1): Met PT Short Term Goal 3 (Week 1): Patient will tolerate standing with LRAD for >1 min PT Short Term Goal 3 - Progress (Week 1): Partly met  Skilled Therapeutic Interventions/Progress Updates: Pt presented at EOB completing breakfast agreeable to therapy. Pt denies pain at rest, intermittent pain in B knees during session which resolved with STM, a-p mobs to knees and rest. PTA obtained fresh battery for Jackson Figueroa and performed Jackson Figueroa transfer to w/c. Pt then transported to rehab gym for time management and energy conservation. Performed Jackson Figueroa transfer to mat and pt participated in following supine and seated therex. LAQ with 1.5lb cuff with PTA assisting to achieve end range, hip flexion AAROM x 10 ea. Pt then required minA for sit to supine then participated in heel slides, SAQ, hip abd/add, and AA SLR from smaller red physioball  X 10 ea bilaterally. Pt was able to roll to L supervision and required minA to return to sitting EOM. Performed Jackson Figueroa transfer back to w/c and pt propelled ~28ft supervision as PTA cleaned Jackson Figueroa. Pt transported remaining distance back to room for time management. Performed Jackson Figueroa transfer back to bed and pt was able to return to supine to CGA and increased time/effort. PTA blocked B feet and pt was able to scoot to [facility] with bed in trendelenburg. Pt left in bed at end of session with call bell within reach and needs met.     Tx2: Pt presented in w/c  agreeable to therapy. Pt states some soreness in bilateral knees with nsg arriving providing pain meds at beginning of session. Pt propelled to nsg station with supervision and increased time/effort. PTA transported remaining distance to rehab gym and performed SB transfer to mat with modA (initially requiring maxA to fully slide onto SB). Pt then participated in ball taps with 1lb dowel 2 x 10, and zoom ball 2 x 20 for general conditioning. Pt then required brief supine break with minA for sit to supine and minA for supine to sit once recovered. Pt then participated in standing tolerance in [place] performing TKE, and wt shifting laterally and small step forward/backward alternating LE. Pt was able to stand for 7 min then additional 5 min with seated rest break. Pt was able to sustain short bouts of full knee extension without knee instability noted, nor hyperextension. Upon completion of activity PTA set up SB and pt returned to w/c via SB with MinA (near [place]) downhill. Pt then transported to nsg station and propelled remaining distance back to room. In room performed transfer back to bed with Jackson Figueroa and returned to supine with CGA and increased time. Pt left in bed with call bell within reach and needs met with SO present.      Therapy Documentation Precautions:  Precautions Precautions: Fall Precaution Comments: PEG, sacral and toe wounds Restrictions Weight Bearing Restrictions: No General:   Vital Signs: Therapy Vitals Temp: 98.5 F (36.9 C) Pulse Rate:  95 Resp: 16 BP: 97/61 Patient Position (if appropriate): Sitting Oxygen Therapy SpO2: 96 % O2 Device: Room Air Pain:   Mobility:   Locomotion :    Trunk/Postural Assessment :    Balance:   Exercises:   Other Treatments:      Therapy/Group: Individual Therapy  Jackson Figueroa 12/27/2020, 4:12 PM

## 2020-12-27 NOTE — Progress Notes (Signed)
Occupational Therapy Session Note  Patient Details  Name: Jackson Figueroa MRN: [id number] Date of Birth: [birth year]  Today's Date: 12/27/2020 OT Individual Time: 1100-1200 OT Individual Time Calculation (min): 60 min    Short Term Goals: Week 1:  OT Short Term Goal 1 (Week 1): Pt will be able to tolerate standing in [place] stedy for at least 3 minutes to demonstrate improved endurance. OT Short Term Goal 1 - Progress (Week 1): Progressing toward goal OT Short Term Goal 2 (Week 1): Pt will be able to don and doff pants over hips using a bridge in bed from supine. OT Short Term Goal 2 - Progress (Week 1): Progressing toward goal OT Short Term Goal 3 (Week 1): Pt will be able to reach R arm back behind his hips to be able to adjust pants and increase potential for full cleansing. OT Short Term Goal 3 - Progress (Week 1): Progressing toward goal OT Short Term Goal 4 (Week 1): Pt will don shirt with min A. OT Short Term Goal 4 - Progress (Week 1): Met  Skilled Therapeutic Interventions/Progress Updates:    1;1. Pt received in bed agreeable to OT. Pt completes Sup>sitting with A to elevate trunk. [name] transfer to/from w/c. Grooming at sink with MOD I seated. Pt agreeable to working on BUE to work covid shot into muscles and strengthen arms. Pt propels w/c part way to/from all tx destinations. Pt completes 2 min backwards 2 min forward and UBE for "warm up" and UB conditioning. Pt completes towel slides with 4# arm weights for shoulder strengthening in gravity eliminated position to improve proximal control and strengthening. Exited session with pt seated in EOB, exit alarm on and call light in reach   Therapy Documentation Precautions:  Precautions Precautions: Fall Precaution Comments: PEG, sacral and toe wounds Restrictions Weight Bearing Restrictions: No General:   Vital Signs: Therapy Vitals Temp: 97.8 F (36.6 C) Pulse Rate: 76 Resp: 18 BP: 100/67 Patient Position (if appropriate):  Lying Oxygen Therapy SpO2: 95 % O2 Device: Room Air Pain:   ADL: ADL Eating: Set up Grooming: Setup Where Assessed-Grooming: Edge of bed Upper Body Bathing: Setup Where Assessed-Upper Body Bathing: Edge of bed Lower Body Bathing: Maximal assistance Where Assessed-Lower Body Bathing: Bed level Upper Body Dressing: Minimal assistance Where Assessed-Upper Body Dressing: Edge of bed Lower Body Dressing: Maximal assistance Where Assessed-Lower Body Dressing: Bed level Vision   Perception    Praxis   Exercises:   Other Treatments:     Therapy/Group: Individual Therapy  [name] 12/27/2020, 6:50 AM

## 2020-12-28 DIAGNOSIS — R7989 Other specified abnormal findings of blood chemistry: Secondary | ICD-10-CM

## 2020-12-28 DIAGNOSIS — Z931 Gastrostomy status: Secondary | ICD-10-CM

## 2020-12-28 DIAGNOSIS — D62 Acute posthemorrhagic anemia: Secondary | ICD-10-CM

## 2020-12-28 DIAGNOSIS — M792 Neuralgia and neuritis, unspecified: Secondary | ICD-10-CM

## 2020-12-28 DIAGNOSIS — G5621 Lesion of ulnar nerve, right upper limb: Secondary | ICD-10-CM

## 2020-12-28 NOTE — Progress Notes (Signed)
[place] PHYSICAL MEDICINE & REHABILITATION PROGRESS NOTE   Subjective/Complaints: Patient seen laying in bed this morning.  He states he slept well overnight.  He is questions regarding his wrist pain.  ROS: Denies CP, SOB, N/V/D  Objective:   No results found. No results for input(s): WBC, HGB, HCT, PLT in the last 72 hours. Recent Labs    12/26/20 0459  NA 138  K 4.1  CL 105  CO2 25  GLUCOSE 90  BUN 29*  CREATININE 1.48*  CALCIUM 8.9    Intake/Output Summary (Last 24 hours) at 12/28/2020 1401 Last data filed at 12/28/2020 0932 Gross per 24 hour  Intake 480 ml  Output 2625 ml  Net -2145 ml     Pressure Injury 11/18/20 Toe (Comment  which one) Anterior;Right Deep Tissue Pressure Injury - Purple or maroon localized area of discolored intact skin or blood-filled blister due to damage of underlying soft tissue from pressure and/or shear. DTI left  (Active)  11/18/20 2330  Location: Toe (Comment  which one) (LEFT big toe)  Location Orientation: Anterior;Right  Staging: Deep Tissue Pressure Injury - Purple or maroon localized area of discolored intact skin or blood-filled blister due to damage of underlying soft tissue from pressure and/or shear.  Wound Description (Comments): DTI left toe  Present on Admission: Yes (on admit to 5M)    Physical Exam: Vital Signs Blood pressure 94/74, pulse 88, temperature 98 F (36.7 C), resp. rate 19, height 6' (1.829 m), weight 110 kg, SpO2 100 %. Constitutional: No distress . Vital signs reviewed. HENT: Normocephalic.  Atraumatic. Eyes: EOMI. No discharge. Cardiovascular: No JVD.  RRR. Respiratory: Normal effort.  No stridor.  Bilateral clear to auscultation. GI: Non-distended.  BS +. Skin: Warm and dry.   Sacrum not examined Psych: Normal mood.  Normal behavior. Musc: No edema in extremities.  No tenderness in extremities. Neuro: Alert Right ulnar neuropathy- numbness in lateral 4th/5th digit (more in 5th digit numbness) on R  with intrinsic atrophy Motor: Right upper extremity: Shoulder abduction 4 --4/5, distally 3/5 Left upper extremity: 4/5 proximal distal Bilateral lower extremities: Hip flexion, knee extension 3 -/5, ankle dorsiflexion 4/5  Assessment/Plan: 1. Functional deficits which require 3+ hours per day of interdisciplinary therapy in a comprehensive inpatient rehab setting.  Physiatrist is providing close team supervision and 24 hour management of active medical problems listed below.  Physiatrist and rehab team continue to assess barriers to discharge/monitor patient progress toward functional and medical goals  Care Tool:  Bathing    Body parts bathed by patient: Right arm,Left arm,Chest,Abdomen,Front perineal area,Right upper leg,Left upper leg,Right lower leg,Left lower leg,Face   Body parts bathed by helper: Buttocks     Bathing assist Assist Level: Minimal Assistance - Patient > 75%     Upper Body Dressing/Undressing Upper body dressing   What is the patient wearing?: Pull over shirt    Upper body assist Assist Level: Supervision/Verbal cueing    Lower Body Dressing/Undressing Lower body dressing      What is the patient wearing?: Pants     Lower body assist Assist for lower body dressing: Moderate Assistance - Patient 50 - 74%     Toileting Toileting    Toileting assist Assist for toileting: Independent with assistive device Assistive Device Comment: Urinal   Transfers Chair/bed transfer  Transfers assist  Chair/bed transfer activity did not occur: Safety/medical concerns (due to weakness/fatigue)  Chair/bed transfer assist level: Maximal Assistance - Patient 25 - 49% (slide board)  Locomotion Ambulation   Ambulation assist   Ambulation activity did not occur: Safety/medical concerns (due to weakness/fatigue)          Walk 10 feet activity   Assist  Walk 10 feet activity did not occur: Safety/medical concerns (due to weakness/fatigue)         Walk 50 feet activity   Assist Walk 50 feet with 2 turns activity did not occur: Safety/medical concerns (due to weakness/fatigue)         Walk 150 feet activity   Assist Walk 150 feet activity did not occur: Safety/medical concerns (due to weakness/fatigue)         Walk 10 feet on uneven surface  activity   Assist Walk 10 feet on uneven surfaces activity did not occur: Safety/medical concerns (due to weakness/fatigue)         Wheelchair     Assist Will patient use wheelchair at discharge?: Yes Type of Wheelchair: Manual Wheelchair activity did not occur: Safety/medical concerns (due to weakness/fatigue)  Wheelchair assist level: Supervision/Verbal cueing Max wheelchair distance: 150'    Wheelchair 50 feet with 2 turns activity    Assist    Wheelchair 50 feet with 2 turns activity did not occur: Safety/medical concerns (due to weakness/fatigue)   Assist Level: Supervision/Verbal cueing   Wheelchair 150 feet activity     Assist  Wheelchair 150 feet activity did not occur: Safety/medical concerns (due to weakness/fatigue)   Assist Level: Supervision/Verbal cueing   Blood pressure 94/74, pulse 88, temperature 98 F (36.7 C), resp. rate 19, height 6' (1.829 m), weight 110 kg, SpO2 100 %.    Medical Problem List and Plan: 1.  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  Antithrombotics: -DVT/anticoagulation:  Pharmaceutical: Lovenox             -antiplatelet therapy: N/A 3. Pain Management: tylenol prn  tramadol 50 mg q6 hours prn  Continue Lyrica  Controlled meds on 2/5 4. Mood: LCSW to follow for evaluation and support.              -antipsychotic agents: Seroquel 25 5. Neuropsych: This patient is capable of making decisions on his own behalf. 6. Skin/Wound Care:                          DTI with MASD:  areas on buttocks improving.  Zinc for moisture barrier as well as pressure relief measures.   PEG flushes 7. Fluids/Electrolytes/Nutrition: Monitor I/O. Encourage fluid intake. Continue to monitor lytes with serial checks.  8. Morbid obesity: Educate on importance of diet and weight loss to help promote overall health. Encourage side lying when in bed.  9. Pre-renal azotemia: Improving off tube feeds--encourage fluid intake.              Creatinine 1.48 on 2/3, labs ordered for Monday 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              Hemoglobin 9.5 on 2/3  overall stable- con't regimen 11. Encephalopathy: Has resolved. Continue Seroquel to help with sleep hygeine.  12.  Dysphagia: Resolved  2/2- PEG was placed 12/30- so cannot be removed til minimum 3/1  13. Recent trach: Closed  14.  Right ulnar neuropathy appears to be chronic but worsened since his  hospitalization.  Lyrica 50 mg TID_ explained won't help numbness/weakness, only nerve pain. 15. Long COVID  Covid vaccine received on 2/4 LOS: 10 days A FACE TO FACE EVALUATION WAS PERFORMED  Taquila Leys Lorie Phenix 12/28/2020, 2:01 PM

## 2020-12-28 NOTE — Progress Notes (Signed)
Physical Therapy Session Note  Patient Details  Name: Jackson Figueroa MRN: [id number] Date of Birth: 1961-04-28  Today's Date: 12/28/2020 PT Individual Time: 1300-1400 PT Individual Time Calculation (min): 60 min   Short Term Goals: Week 1:  PT Short Term Goal 1 (Week 1): Patient will be able to transition sit <> supine with CGA PT Short Term Goal 1 - Progress (Week 1): Progressing toward goal PT Short Term Goal 2 (Week 1): Patient will be able to transfer with LRAD and MaxA PT Short Term Goal 2 - Progress (Week 1): Met PT Short Term Goal 3 (Week 1): Patient will tolerate standing with LRAD for >1 min PT Short Term Goal 3 - Progress (Week 1): Partly met Week 2:  PT Short Term Goal 1 (Week 2): Pt will tolerate standing x 5 min with LRAD PT Short Term Goal 2 (Week 2): Pt will complete least restrictive transfer with mod A consistently PT Short Term Goal 3 (Week 2): Pt will initiate gait training as safe and able  Skilled Therapeutic Interventions/Progress Updates:  Pt received seated EOB finishing lunch, agreeable to PT session. Pt reports pain in B knees this date, not rated. Provided ice packs to knees at end of session for pain management. Sit to supine mod A for BLE management. Assisted pt with donning underwear and shorts at bed level, mod A. Pt returned to sitting EOB with mod A needed for trunk control. [name] Plust transfer to w/c. Manual w/c propulsion x 50 ft with use of BUE and BLE, x 50 ft backwards with use of BLE for LE strengthening. Sit to stand x 4 reps in standing frame. While in standing pt able to perform mini-squats 2 x 5 reps, 1 x 4 reps, 1 x 3 reps to fatigue. Pt exhibits decreased B knee control with onset of fatigue. Pt requests to return to bed at end of session. [name] transfer back to bed. Sit to supine mod A needed for BLE management. Pt left semi-reclined in bed with needs in reach, ice packs to knees at end of session.  Therapy Documentation Precautions:   Precautions Precautions: Fall Precaution Comments: PEG, sacral and toe wounds Restrictions Weight Bearing Restrictions: No   Therapy/Group: Individual Therapy   Excell Seltzer, PT, DPT  12/28/2020, 5:16 PM

## 2020-12-29 NOTE — Progress Notes (Signed)
Physical Therapy Session Note  Patient Details  Name: Jackson Figueroa MRN: [id number] Date of Birth: 11/04/1961  Today's Date: 12/29/2020 PT Individual Time: 1347-1500 PT Individual Time Calculation (min): 73 min   Short Term Goals: Week 2:  PT Short Term Goal 1 (Week 2): Pt will tolerate standing x 5 min with LRAD PT Short Term Goal 2 (Week 2): Pt will complete least restrictive transfer with mod A consistently PT Short Term Goal 3 (Week 2): Pt will initiate gait training as safe and able  Skilled Therapeutic Interventions/Progress Updates: Pt presented at EOB with GF present. Pt states knees "dont' feel too bad today" no numerical number given and rest break provided as needed. PTA donned socks/shoes total A for time management and energy conservation. Performed Sara transfer to w/c and transported to rehab gym for energy conservation. Performed Sara transfer to mat and discussed set up for possible attempt at gait training. Pt set up with Maxi Sky sling and attempted stand from elevated mat. Pt unable to tolerate Maxi sling and felt it was restrictive to come to full upright stand. PTA noted that pt was able to come to full standing with minimal use of maxi sky thus changed focus of session to attempt STS without sling from elevated surface. Pt agreeable to change in tactics. Sling removed and mat elevated to 24in. Pt was able to perform STS x 3 with modA!!!! During standing trials pt attempted wt shifting and was able to perform lateral wt shifts. Pt attempted to take step in place however pt unable to maintain wt on single LE thus both knees buckled and pt had uncontrolled descent to mat. After third trail pt performed BUE activity prior to returning to w/c. Pt performed bicep curls with 1Kg weighted ball x 10, chest press with 1Kg ball x 10, and diagonals x 10 L and R. Pt then transferred back to mat via Sara and propelled to nsg station with BUE for general conditioning. Pt transported remaining  distance and performed Sara transfer back to bed. Pt left seated EOB with nsg and SO present with current needs met.      Therapy Documentation Precautions:  Precautions Precautions: Fall Precaution Comments: PEG, sacral and toe wounds Restrictions Weight Bearing Restrictions: No General:   Vital Signs: Therapy Vitals Temp: 98.2 F (36.8 C) Pulse Rate: 91 Resp: 20 BP: 98/62 Patient Position (if appropriate): Lying Oxygen Therapy SpO2: 98 % O2 Device: Room Air    Therapy/Group: Individual Therapy      , PTA  12/29/2020, 4:01 PM  

## 2020-12-30 LAB — BASIC METABOLIC PANEL
Anion gap: 9 (ref 5–15)
BUN: 26 mg/dL — ABNORMAL HIGH (ref 6–20)
CO2: 25 mmol/L (ref 22–32)
Calcium: 9.1 mg/dL (ref 8.9–10.3)
Chloride: 106 mmol/L (ref 98–111)
Creatinine, Ser: 1.57 mg/dL — ABNORMAL HIGH (ref 0.61–1.24)
GFR, Estimated: 51 mL/min — ABNORMAL LOW (ref >60)
Glucose, Bld: 90 mg/dL (ref 70–99)
Potassium: 4.2 mmol/L (ref 3.5–5.1)
Sodium: 140 mmol/L (ref 135–145)

## 2020-12-30 LAB — CBC WITH DIFFERENTIAL/PLATELET
Abs Immature Granulocytes: 0.06 10*3/uL (ref 0.00–0.07)
Basophils Absolute: 0 10*3/uL (ref 0.0–0.1)
Basophils Relative: 1 %
Eosinophils Absolute: 0.4 10*3/uL (ref 0.0–0.5)
Eosinophils Relative: 7 %
HCT: 28.3 % — ABNORMAL LOW (ref 39.0–52.0)
Hemoglobin: 9.2 g/dL — ABNORMAL LOW (ref 13.0–17.0)
Immature Granulocytes: 1 %
Lymphocytes Relative: 20 %
Lymphs Abs: 1.2 10*3/uL (ref 0.7–4.0)
MCH: 32.6 pg (ref 26.0–34.0)
MCHC: 32.5 g/dL (ref 30.0–36.0)
MCV: 100.4 fL — ABNORMAL HIGH (ref 80.0–100.0)
Monocytes Absolute: 0.9 10*3/uL (ref 0.1–1.0)
Monocytes Relative: 14 %
Neutro Abs: 3.5 10*3/uL (ref 1.7–7.7)
Neutrophils Relative %: 57 %
Platelets: 210 10*3/uL (ref 150–400)
RBC: 2.82 MIL/uL — ABNORMAL LOW (ref 4.22–5.81)
RDW: 13.9 % (ref 11.5–15.5)
WBC: 6.2 10*3/uL (ref 4.0–10.5)
nRBC: 0 % (ref 0.0–0.2)

## 2020-12-30 NOTE — Progress Notes (Signed)
[place] PHYSICAL MEDICINE & REHABILITATION PROGRESS NOTE   Subjective/Complaints:  Pt reports LBM  This AM- getting on BSC to have BM now-  Having all over aching pain- plans to get tramadol from nurse when they come in.   Got COVID vaccine Friday- tolerated great- no side effects- wants to get 2nd one in 2 weeks- if still here.    ROS:  Pt denies SOB, abd pain, CP, N/V/C/D, and vision changes   Objective:   No results found. Recent Labs    12/30/20 0646  WBC 6.2  HGB 9.2*  HCT 28.3*  PLT 210   Recent Labs    12/30/20 0646  NA 140  K 4.2  CL 106  CO2 25  GLUCOSE 90  BUN 26*  CREATININE 1.57*  CALCIUM 9.1    Intake/Output Summary (Last 24 hours) at 12/30/2020 0947 Last data filed at 12/30/2020 0900 Gross per 24 hour  Intake 900 ml  Output 4575 ml  Net -3675 ml     Pressure Injury 11/18/20 Toe (Comment  which one) Anterior;Right Deep Tissue Pressure Injury - Purple or maroon localized area of discolored intact skin or blood-filled blister due to damage of underlying soft tissue from pressure and/or shear. DTI left  (Active)  11/18/20 2330  Location: Toe (Comment  which one) (LEFT big toe)  Location Orientation: Anterior;Right  Staging: Deep Tissue Pressure Injury - Purple or maroon localized area of discolored intact skin or blood-filled blister due to damage of underlying soft tissue from pressure and/or shear.  Wound Description (Comments): DTI left toe  Present on Admission: Yes (on admit to 79M)    Physical Exam: Vital Signs Blood pressure 100/68, pulse 72, temperature 98.3 F (36.8 C), resp. rate 17, height 6' (1.829 m), weight 110 kg, SpO2 94 %. Constitutional: No distress . Vital signs reviewed. Sitting EOB with PT in room, NAD HENT: Normocephalic.  Atraumatic. Eyes: EOMI. No discharge. Cardiovascular: RRR- no JVD Respiratory: CTA B/L- no W/R/R- good air movement. GI: Soft, NT, ND, (+)BS -hyperactive- just about to have BM Skin: Warm and dry.    Sacrum not examined Psych: appropriate Musc: No edema in extremities.  No tenderness in extremities. Neuro: Alert Right ulnar neuropathy- numbness in lateral 4th/5th digit (more in 5th digit numbness) on R with intrinsic atrophy- no change at all today Motor: Right upper extremity: Shoulder abduction 4 --4/5, distally 3/5 Left upper extremity: 4/5 proximal distal Bilateral lower extremities: Hip flexion, knee extension 3 -/5, ankle dorsiflexion 4/5  Assessment/Plan: 1. Functional deficits which require 3+ hours per day of interdisciplinary therapy in a comprehensive inpatient rehab setting.  Physiatrist is providing close team supervision and 24 hour management of active medical problems listed below.  Physiatrist and rehab team continue to assess barriers to discharge/monitor patient progress toward functional and medical goals  Care Tool:  Bathing    Body parts bathed by patient: Right arm,Left arm,Chest,Abdomen,Front perineal area,Right upper leg,Left upper leg,Right lower leg,Left lower leg,Face   Body parts bathed by helper: Buttocks     Bathing assist Assist Level: Minimal Assistance - Patient > 75%     Upper Body Dressing/Undressing Upper body dressing   What is the patient wearing?: Pull over shirt    Upper body assist Assist Level: Supervision/Verbal cueing    Lower Body Dressing/Undressing Lower body dressing      What is the patient wearing?: Pants     Lower body assist Assist for lower body dressing: Moderate Assistance - Patient 50 -  74%     Toileting Toileting    Toileting assist Assist for toileting: Independent with assistive device Assistive Device Comment: Urinal   Transfers Chair/bed transfer  Transfers assist  Chair/bed transfer activity did not occur: Safety/medical concerns (due to weakness/fatigue)  Chair/bed transfer assist level: Dependent - mechanical lift     Locomotion Ambulation   Ambulation assist   Ambulation activity  did not occur: Safety/medical concerns (due to weakness/fatigue)          Walk 10 feet activity   Assist  Walk 10 feet activity did not occur: Safety/medical concerns (due to weakness/fatigue)        Walk 50 feet activity   Assist Walk 50 feet with 2 turns activity did not occur: Safety/medical concerns (due to weakness/fatigue)         Walk 150 feet activity   Assist Walk 150 feet activity did not occur: Safety/medical concerns (due to weakness/fatigue)         Walk 10 feet on uneven surface  activity   Assist Walk 10 feet on uneven surfaces activity did not occur: Safety/medical concerns (due to weakness/fatigue)         Wheelchair     Assist Will patient use wheelchair at discharge?: Yes Type of Wheelchair: Manual Wheelchair activity did not occur: Safety/medical concerns (due to weakness/fatigue)  Wheelchair assist level: Supervision/Verbal cueing Max wheelchair distance: 150'    Wheelchair 50 feet with 2 turns activity    Assist    Wheelchair 50 feet with 2 turns activity did not occur: Safety/medical concerns (due to weakness/fatigue)   Assist Level: Supervision/Verbal cueing   Wheelchair 150 feet activity     Assist  Wheelchair 150 feet activity did not occur: Safety/medical concerns (due to weakness/fatigue)   Assist Level: Supervision/Verbal cueing   Blood pressure 100/68, pulse 72, temperature 98.3 F (36.8 C), resp. rate 17, height 6' (1.829 m), weight 110 kg, SpO2 94 %.    Medical Problem List and Plan: 1.  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  Antithrombotics: -DVT/anticoagulation:  Pharmaceutical: Lovenox             -antiplatelet therapy: N/A 3. Pain Management: tylenol prn  tramadol 50 mg q6 hours prn  Continue Lyrica  Controlled meds on 2/5  2/7- Lyrica helping nerve pain a  lot and tramadol helping all over body aches- con't regimen 4. Mood: LCSW to follow for evaluation and support.              -antipsychotic agents: Seroquel 25 5. Neuropsych: This patient is capable of making decisions on his own behalf. 6. Skin/Wound Care:                          DTI with MASD: areas on buttocks improving.  Zinc for moisture barrier as well as pressure relief measures.   PEG flushes 7. Fluids/Electrolytes/Nutrition: Monitor I/O. Encourage fluid intake. Continue to monitor lytes with serial checks.  8. Morbid obesity: Educate on importance of diet and weight loss to help promote overall health. Encourage side lying when in bed.  9. Pre-renal azotemia vs new CKD Stage IIIa: Improving off tube feeds--encourage fluid intake.              Creatinine 1.48 on 2/3, labs ordered for Monday  2/7- Cr  up to 1.57 again- Im wondering if has a new baseline- because is always there- ~ 1.4-1.5 unless gets IVFs-  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              Hemoglobin 9.5 on 2/3  overall stable- con't regimen 11. Encephalopathy: Has resolved. Continue Seroquel to help with sleep hygeine.  12.  Dysphagia: Resolved  2/2- PEG was placed 12/30- so cannot be removed til minimum 3/1  13. Recent trach: Closed  14.  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  Lyrica 50 mg TID_ explained won't help numbness/weakness, only nerve pain.  2/7- helping nerve pain a lot- con't regimen 15. Long COVID  Covid vaccine received on 2/4  2/7- can get another vaccine- the second shot next Friday.    LOS: 12 days A FACE TO FACE EVALUATION WAS PERFORMED  Jackson Figueroa 12/30/2020, 9:47 AM

## 2020-12-30 NOTE — Progress Notes (Signed)
Occupational Therapy Session Note  Patient Details  Name: Jackson Figueroa MRN: [id number] Date of Birth: Jun 27, 1961  Today's Date: 12/30/2020 OT Individual Time: 4715-9539 OT Individual Time Calculation (min): 28 min    Short Term Goals: Week 2:  OT Short Term Goal 1 (Week 2): Pt will be able to tolerate standing in [place] stedy for at least 3 minutes to demonstrate improved endurance. OT Short Term Goal 2 (Week 2): Pt will be able to don and doff pants over hips using a bridge in bed from supine. OT Short Term Goal 3 (Week 2): Pt will be able to reach R arm back behind his hips to be able to adjust pants and increase potential for full cleansing.   Skilled Therapeutic Interventions/Progress Updates:    Pt greeted at time of session semireclined in bed, agreeable to OT session and no pain but fatigued from full day of therapy. Did not need to perform ADL, in favor of bed level there ex d/t reports of BUE weakness when trying to use Stedy earlier today. 1x15 for BUE strengthening using 2# dowel with cues for control and pacing, chest press, overhead press, FWD and backward circles. Dynamic core strengthening activity for hitting beach ball toss for 2x20. Pt in supine positioned with pillow under hip for comfort with call bell in reach all needs met.   Therapy Documentation Precautions:  Precautions Precautions: Fall Precaution Comments: PEG, sacral and toe wounds Restrictions Weight Bearing Restrictions: No     Therapy/Group: Individual Therapy  Viona Gilmore 12/30/2020, 4:36 PM

## 2020-12-30 NOTE — Progress Notes (Signed)
Occupational Therapy Session Note  Patient Details  Name: Jackson Figueroa MRN: [id number] Date of Birth: 12-29-1960  Today's Date: 12/30/2020 OT Individual Time: 1000-1100 OT Individual Time Calculation (min): 60 min   Short Term Goals: Week 2:  OT Short Term Goal 1 (Week 2): Pt will be able to tolerate standing in [place] stedy for at least 3 minutes to demonstrate improved endurance. OT Short Term Goal 2 (Week 2): Pt will be able to don and doff pants over hips using a bridge in bed from supine. OT Short Term Goal 3 (Week 2): Pt will be able to reach R arm back behind his hips to be able to adjust pants and increase potential for full cleansing.    Skilled Therapeutic Interventions/Progress Updates:    Pt greeted in bed and premedicated for pain. ADL needs met. First practiced sit<stands in bariatric Stedy, pt requiring Max A boost from the back with the bed a little higher than his BSC (OT adjusted BSC to increase surface height). Pt only able to complete 2 sit<stands due to fatigue, OT positioned a pillow in front of knees for comfort during 3rd trial but pt unable to fully stand. Transitioned to Rt grip strengthening by engaging in modified golfing activity while he sat EOB. When he became more uncomfortable sitting he transitioned to supine and repositioned himself in bed with HOB elevated. Continued working on Rt UE strengthening by modified baseball toss. Pt used to have golf and baseball as hobbies as his. Affect appeared bright during participation today. At end of session pt remained in bed with all needs within reach.     Therapy Documentation Precautions:  Precautions Precautions: Fall Precaution Comments: PEG, sacral and toe wounds Restrictions Weight Bearing Restrictions: No ADL: ADL Eating: Set up Grooming: Setup Where Assessed-Grooming: Edge of bed Upper Body Bathing: Setup Where Assessed-Upper Body Bathing: Edge of bed Lower Body Bathing: Maximal assistance Where  Assessed-Lower Body Bathing: Bed level Upper Body Dressing: Minimal assistance Where Assessed-Upper Body Dressing: Edge of bed Lower Body Dressing: Maximal assistance Where Assessed-Lower Body Dressing: Bed level      Therapy/Group: Individual Therapy  Alyxandra Tenbrink A Darthula Desa 12/30/2020, 12:03 PM

## 2020-12-30 NOTE — Progress Notes (Signed)
Physical Therapy Session Note  Patient Details  Name: Jackson Figueroa MRN: [id number] Date of Birth: 13-Mar-1961  Today's Date: 12/30/2020 PT Individual Time: 9432-7614 PT Individual Time Calculation (min): 30 min   Short Term Goals: Week 1:  PT Short Term Goal 1 (Week 1): Patient will be able to transition sit <> supine with CGA PT Short Term Goal 1 - Progress (Week 1): Progressing toward goal PT Short Term Goal 2 (Week 1): Patient will be able to transfer with LRAD and MaxA PT Short Term Goal 2 - Progress (Week 1): Met PT Short Term Goal 3 (Week 1): Patient will tolerate standing with LRAD for >1 min PT Short Term Goal 3 - Progress (Week 1): Partly met Week 2:  PT Short Term Goal 1 (Week 2): Pt will tolerate standing x 5 min with LRAD PT Short Term Goal 2 (Week 2): Pt will complete least restrictive transfer with mod A consistently PT Short Term Goal 3 (Week 2): Pt will initiate gait training as safe and able  Skilled Therapeutic Interventions/Progress Updates:    pt received in bed and agreeable to therapy. Pt reported "some" pain in B knees but did not rank. Pt directed in supine>sit EOB mod A with HOB elevated greatly. Pt requested to sit EOB for session and transfer back to supine to rest before next session. Pt directed in seated BLE strengthening exercises of marching, LAQ with isometric holds 2s each, hip abduction, hip adduction, glute squeezes, ankle pumps, heel raises 2x10.Marland Kitchen Pt fatigued quickly throughout session and benefited from frequent rest breaks. Pt directed in sit>supine at end of session modA for BLE management and hospital bed functions required for positioning. Pt left in supine, alarm set, All needs in reach and in good condition. Call light in hand.     Therapy Documentation Precautions:  Precautions Precautions: Fall Precaution Comments: PEG, sacral and toe wounds Restrictions Weight Bearing Restrictions: No General:   Vital Signs: Therapy Vitals Temp: 98.1 F  (36.7 C) Pulse Rate: 89 Resp: 20 BP: 96/65 Patient Position (if appropriate): Lying Oxygen Therapy SpO2: 99 % O2 Device: Room Air Pain:   Mobility:   Locomotion :    Trunk/Postural Assessment :    Balance:   Exercises:   Other Treatments:      Therapy/Group: Individual Therapy  Junie Panning 12/30/2020, 3:40 PM

## 2020-12-30 NOTE — Progress Notes (Signed)
Physical Therapy Session Note  Patient Details  Name: Jackson Figueroa MRN: [id number] Date of Birth: 02-Feb-1961  Today's Date: 12/30/2020 PT Individual Time: 0800-0900 PT Individual Time Calculation (min): 60 min   Short Term Goals: Week 2:  PT Short Term Goal 1 (Week 2): Pt will tolerate standing x 5 min with LRAD PT Short Term Goal 2 (Week 2): Pt will complete least restrictive transfer with mod A consistently PT Short Term Goal 3 (Week 2): Pt will initiate gait training as safe and able  Skilled Therapeutic Interventions/Progress Updates:    Pt received seated in bed, agreeable to PT session. Pt reports some pain in B knees this AM, not rated, nursing able to provide pain medication at beginning of session. Supine to/from sit requiring up to mod A for LE management and/or trunk control. Assisted pt with donning underwear and pants at bed level. Assisted pt with donning socks and shoes while seated EOB. [name] transfer bed to/from w/c. Manual w/c propulsion x 100 ft with use of BUE at Supervision level. Slide board transfer w/c to mat table initially with max A progressing to CGA once transfer initiated. Sit to stand x 3 reps from elevated mat table to stedy with max A x 2 to achieve standing. Once standing pt able to unlock knees slightly but unable to maintain position due to LE fatigue and pain in knees. Slide board transfer back to w/c with CGA. Pt assisted back to bed at end of session via [name]. Pt left seated in bed with needs in reach at end of session.  Therapy Documentation Precautions:  Precautions Precautions: Fall Precaution Comments: PEG, sacral and toe wounds Restrictions Weight Bearing Restrictions: No    Therapy/Group: Individual Therapy   Excell Seltzer, PT, DPT  12/30/2020, 12:10 PM

## 2020-12-31 ENCOUNTER — Telehealth: Payer: Self-pay

## 2020-12-31 MED ORDER — IBUPROFEN 400 MG PO TABS
600.0000 mg | ORAL_TABLET | Freq: Three times a day (TID) | ORAL | Status: DC | PRN
  Administered 2020-12-31 – 2021-01-08 (×9): 600 mg via ORAL
  Filled 2020-12-31 (×10): qty 2

## 2020-12-31 MED ORDER — METAXALONE 800 MG PO TABS
800.0000 mg | ORAL_TABLET | Freq: Three times a day (TID) | ORAL | Status: DC | PRN
  Administered 2021-01-20 – 2021-01-24 (×5): 800 mg via ORAL
  Filled 2020-12-31 (×6): qty 1

## 2020-12-31 NOTE — Patient Care Conference (Addendum)
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 Courtney Heys Care Coodinator Present: Loralee Pacas, LCSWA;Nitika Jackowski Creig Hines, RN, BSN, [place] Nurse Present: Other (comment) PT Present: Excell Seltzer, PT OT Present: Roanna Epley, COTA;Jennifer Tamala Julian, OT PPS Coordinator present : Gunnar Fusi, SLP Physician leading conference: Dr. Courtney Heys Care Coodinator Present: Loralee Pacas, LCSWA;Brittiney Dicostanzo Creig Hines, RN, BSN, [place] Nurse Present: Other (comment) Demetrios Loll, RN PT Present: Excell Seltzer, PT OT Present: Roanna Epley, COTA;Jennifer Tamala Julian, OT PPS Coordinator present : Gunnar Fusi, SLP      Current Status/Progress Goal Weekly Team Focus  Bowel/Bladder   pt cont of b and b lbm 12/30/20  remain cont of b and b  assess q shift and prn   Swallow/Nutrition/ Hydration             ADL's   transfers with [name] or Stedy +2; UB bathing/dressing-CGA; LB bathing-mod A (shower or w/c); LB dressing-mod A; sitting balance-supervision  CGA overall  overall strengthening/endurance, sittng balance, transfers, BADL trasining, educaiton   Mobility   Supervision to mod A bed mobility depending on fatigue, up to max A SB transfers,  +2 to stand to stedy, otherwise transfers with [name] Plus, w/c mobility up to 150 ft Supervision  CGA overall, gait 50 ft CGA, min A x 14 stairs (likely will need to downgrade gait and stair goals pending progress)  transfers, standing as able, LE NMR, w/c mobility   Communication             Safety/Cognition/ Behavioral Observations            Pain   pt c/o pain in right hand tingling and numbness 7/10  decrease the amoiunt of pain in hand  adminster medication as needed and prn   Skin   MASD to buttocks, DTI to bottom with foam  prevent infection and skin breakdown  Assess q shift and prn, adjust in bed and apply foam to bottom     Discharge Planning:  Pt to d/c to home with s/o [name] who will provide 24/7 care to pt. She has back issues so patient would be more appropriate for atleast Supervision to Mod I at d/c to reduce caregier burden.   Team Discussion: Push PO fluids, likes Ibuprofen even when explained the bleeding risk. MD going to try Skelaxin for muscle spasms. Continent of B/B. Gave Ibuprofen after therapy, again explained the bleeding risk. Significant other will not be able to provide any assistance at discharge.  Patient on target to meet rehab goals: Making slow progress. Introduced the [employer]. Using the [facility] for transfers. Once in W/C is supervision. Using [name] Plus to Stand. Manage clothing at bed level, W/C level, not  standing. Can't manage toileting hygiene.  *See Care Plan and progress notes for long and short-term goals.   Revisions to Treatment Plan:  Added Skelaxin for muscle spasms.  Teaching Needs: Family education, medication management, transfer training, sit to stand training, endurance management, skin/wound care, hygiene management.  Current Barriers to Discharge: Inaccessible home environment, Decreased caregiver support, Home enviroment access/layout, Wound care, Lack of/limited family support, Weight bearing restrictions, Medication compliance  and Behavior  Possible Resolutions to Barriers: Continue current medications, management of pain, provide emotional support for patient and family.     Medical Summary Current Status: gave ibuprofen prn- understood risks of bleeding/kidney issues; pain- tramadol/tylenol,, lyrica- MASD on buttocks; R ulnar neuropathy stable- limiting factor  Barriers to Discharge: Home enviroment access/layout;Decreased family/caregiver support;Medical stability;Weight;Medication compliance;Weight bearing restrictions;Wound care  Barriers to Discharge Comments: Cr 1.57- new baseline? needs to drink more. 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- wears LSO and uses rollator- 3/1 Possible Resolutions to [employer]: using [name] plus mainly- can't take steps; w/c supervision; long way from walking still; OT-bathing min A- buttocks; toileting- cannot reach behind-   Continued Need for Acute Rehabilitation Level of Care: The patient requires daily medical management by a physician with specialized training in physical medicine and rehabilitation for the following reasons: Direction of a multidisciplinary physical rehabilitation program to maximize functional independence : Yes Medical management of patient stability for increased activity during participation in an intensive rehabilitation regime.: Yes Analysis of laboratory values and/or radiology reports with any subsequent need for medication adjustment and/or medical intervention. : Yes   I attest that I was present, lead the team conference, and concur with the assessment and plan of the team.   Cristi Loron 12/31/2020, 6:01 PM

## 2020-12-31 NOTE — Telephone Encounter (Signed)
Gerald Leitz from disability called stating they need an estimated return to work for patient. Patient has no scheduled HFU with office.  [phone] ext [phone]

## 2020-12-31 NOTE — Progress Notes (Signed)
Physical Therapy Session Note  Patient Details  Name: Dariyon Urquilla MRN: [id number] Date of Birth: 1961/03/20  Today's Date: 12/31/2020 PT Individual Time: 1400-1500 PT Individual Time Calculation (min): 60 min   Short Term Goals: Week 2:  PT Short Term Goal 1 (Week 2): Pt will tolerate standing x 5 min with LRAD PT Short Term Goal 2 (Week 2): Pt will complete least restrictive transfer with mod A consistently PT Short Term Goal 3 (Week 2): Pt will initiate gait training as safe and able  Skilled Therapeutic Interventions/Progress Updates:    Pt received seated in bed, agreeable to PT session. Pt reports soreness in B knees but no other complaints of pain. Supine to sit with Supervision with increased time, use of bedrail. [name] transfer bed to/from w/c during session. Manual w/c propulsion 2 x 125 ft with use of BUE at Supervision level. Pt is min A for management of w/c leg rests and arm rests. Session focus on slide board transfers to level and slightly elevated surface. Pt is able to perform transfers to 21", 21.5", 22", and 23" mat table mostly with CGA except mod A needed to initiate final transfer to 23" mat table from w/c. Pt performs transfers to the R and L. Pt requests to return to bed at end of session. Sit to supine mod A for BLE management. Pt left semi-reclined in bed with needs in reach, significant other present.  Therapy Documentation Precautions:  Precautions Precautions: Fall Precaution Comments: PEG, sacral and toe wounds Restrictions Weight Bearing Restrictions: No    Therapy/Group: Individual Therapy   Excell Seltzer, PT, DPT  12/31/2020, 5:06 PM

## 2020-12-31 NOTE — Progress Notes (Signed)
Occupational Therapy Session Note  Patient Details  Name: Jackson Figueroa MRN: [id number] Date of Birth: 1961-10-15  Today's Date: 12/31/2020 OT Individual Time: 0930-1030 OT Individual Time Calculation (min): 60 min    Short Term Goals: Week 2:  OT Short Term Goal 1 (Week 2): Pt will be able to tolerate standing in [place] stedy for at least 3 minutes to demonstrate improved endurance. OT Short Term Goal 2 (Week 2): Pt will be able to don and doff pants over hips using a bridge in bed from supine. OT Short Term Goal 3 (Week 2): Pt will be able to reach R arm back behind his hips to be able to adjust pants and increase potential for full cleansing.  Skilled Therapeutic Interventions/Progress Updates:    Pt sitting EOB upon arrival. Transfer to w/c with [name]. Shaving with electric razor (pt used RUE) seated in w/c at sink.  Grooming w/c level at sink. Pt transitioned to day room and engaged in RUE strengthening with clothes pins. Pt also engaged in BUE AROM/strengthening with 4# barbell. Pt able to manipulate yellow and red clothes pins on largest dowel. Pt returned to room and requested to get in bed-increased buttocks discomfort. Sit>supine with supervisoin. Pt able to reposition in bed without assistance.   Therapy Documentation Precautions:  Precautions Precautions: Fall Precaution Comments: PEG, sacral and toe wounds Restrictions Weight Bearing Restrictions: No   Pain: Pain Assessment Pain Scale: 0-10 Pain Score: 3  Faces Pain Scale: Hurts a little bit Pain Type: Neuropathic pain Pain Location: Hand Pain Orientation: Right   Therapy/Group: Individual Therapy  [name] 12/31/2020, 12:11 PM

## 2020-12-31 NOTE — Progress Notes (Signed)
[place] PHYSICAL MEDICINE & REHABILITATION PROGRESS NOTE   Subjective/Complaints:  Pt reports sore everywhere from exercise.   Wants ibuprofen for soreness/pain- went over risk of increased bleeding with ibuprofen- he still wants 600 mg prn.    LBM yesterday ROS:   Pt denies SOB, abd pain, CP, N/V/C/D, and vision changes    Objective:   No results found. Recent Labs    12/30/20 0646  WBC 6.2  HGB 9.2*  HCT 28.3*  PLT 210   Recent Labs    12/30/20 0646  NA 140  K 4.2  CL 106  CO2 25  GLUCOSE 90  BUN 26*  CREATININE 1.57*  CALCIUM 9.1    Intake/Output Summary (Last 24 hours) at 12/31/2020 0831 Last data filed at 12/31/2020 0745 Gross per 24 hour  Intake 1300 ml  Output 1950 ml  Net -650 ml     Pressure Injury 11/18/20 Toe (Comment  which one) Anterior;Right Deep Tissue Pressure Injury - Purple or maroon localized area of discolored intact skin or blood-filled blister due to damage of underlying soft tissue from pressure and/or shear. DTI left  (Active)  11/18/20 2330  Location: Toe (Comment  which one) (LEFT big toe)  Location Orientation: Anterior;Right  Staging: Deep Tissue Pressure Injury - Purple or maroon localized area of discolored intact skin or blood-filled blister due to damage of underlying soft tissue from pressure and/or shear.  Wound Description (Comments): DTI left toe  Present on Admission: Yes (on admit to 22M)    Physical Exam: Vital Signs Blood pressure 90/67, pulse 72, temperature 97.9 F (36.6 C), temperature source Oral, resp. rate 18, height 6' (1.829 m), weight 110 kg, SpO2 96 %. Constitutional: awake, alert, lying in bed on side, NAD HENT: Normocephalic.  Atraumatic. Eyes: EOMI. No discharge. Cardiovascular: RRR- no JVD Respiratory: CTA B/L- no W/R/R- good air movement GI: Soft, NT, ND, (+)BS  Skin: Warm and dry.   Sacrum not examined Psych: appropriate- c/o all over soreness Musc: No edema in extremities.  No tenderness  in extremities. Neuro: Alert Right ulnar neuropathy- numbness in lateral 4th/5th digit (more in 5th digit numbness) on R with intrinsic atrophy- no change at all today Motor: Right upper extremity: Shoulder abduction 4 --4/5, distally 3/5 Left upper extremity: 4/5 proximal distal Bilateral lower extremities: Hip flexion, knee extension 3 -/5, ankle dorsiflexion 4/5  Assessment/Plan: 1. Functional deficits which require 3+ hours per day of interdisciplinary therapy in a comprehensive inpatient rehab setting.  Physiatrist is providing close team supervision and 24 hour management of active medical problems listed below.  Physiatrist and rehab team continue to assess barriers to discharge/monitor patient progress toward functional and medical goals  Care Tool:  Bathing    Body parts bathed by patient: Right arm,Left arm,Chest,Abdomen,Front perineal area,Right upper leg,Left upper leg,Right lower leg,Left lower leg,Face   Body parts bathed by helper: Buttocks     Bathing assist Assist Level: Minimal Assistance - Patient > 75%     Upper Body Dressing/Undressing Upper body dressing   What is the patient wearing?: Pull over shirt    Upper body assist Assist Level: Supervision/Verbal cueing    Lower Body Dressing/Undressing Lower body dressing      What is the patient wearing?: Pants     Lower body assist Assist for lower body dressing: Moderate Assistance - Patient 50 - 74%     Toileting Toileting    Toileting assist Assist for toileting: Independent with assistive device Assistive Device Comment: Urinal  Transfers Chair/bed transfer  Transfers assist  Chair/bed transfer activity did not occur: Safety/medical concerns (due to weakness/fatigue)  Chair/bed transfer assist level: Maximal Assistance - Patient 25 - 49% (slide board)     Locomotion Ambulation   Ambulation assist   Ambulation activity did not occur: Safety/medical concerns (due to  weakness/fatigue)          Walk 10 feet activity   Assist  Walk 10 feet activity did not occur: Safety/medical concerns (due to weakness/fatigue)        Walk 50 feet activity   Assist Walk 50 feet with 2 turns activity did not occur: Safety/medical concerns (due to weakness/fatigue)         Walk 150 feet activity   Assist Walk 150 feet activity did not occur: Safety/medical concerns (due to weakness/fatigue)         Walk 10 feet on uneven surface  activity   Assist Walk 10 feet on uneven surfaces activity did not occur: Safety/medical concerns (due to weakness/fatigue)         Wheelchair     Assist Will patient use wheelchair at discharge?: Yes Type of Wheelchair: Manual Wheelchair activity did not occur: Safety/medical concerns (due to weakness/fatigue)  Wheelchair assist level: Supervision/Verbal cueing Max wheelchair distance: 150'    Wheelchair 50 feet with 2 turns activity    Assist    Wheelchair 50 feet with 2 turns activity did not occur: Safety/medical concerns (due to weakness/fatigue)   Assist Level: Supervision/Verbal cueing   Wheelchair 150 feet activity     Assist  Wheelchair 150 feet activity did not occur: Safety/medical concerns (due to weakness/fatigue)   Assist Level: Supervision/Verbal cueing   Blood pressure 90/67, pulse 72, temperature 97.9 F (36.6 C), temperature source Oral, resp. rate 18, height 6' (1.829 m), weight 110 kg, SpO2 96 %.    Medical Problem List and Plan: 1.  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  Antithrombotics: -DVT/anticoagulation:  Pharmaceutical: Lovenox             -antiplatelet therapy: N/A 3. Pain Management: tylenol prn  tramadol 50 mg q6 hours prn  Continue Lyrica  Controlled meds on 2/5  2/7- Lyrica helping nerve pain a lot and tramadol  helping all over body aches- con't regimen  2/8- asked for Ibuprofen 600 mg TID prn- explained could increase bleeding risk - he still wants it- ordered 4. Mood: LCSW to follow for evaluation and support.              -antipsychotic agents: Seroquel 25 5. Neuropsych: This patient is capable of making decisions on his own behalf. 6. Skin/Wound Care:                          DTI with MASD: areas on buttocks improving.  Zinc for moisture barrier as well as pressure relief measures.   PEG flushes 7. Fluids/Electrolytes/Nutrition: Monitor I/O. Encourage fluid intake. Continue to monitor lytes with serial checks.  8. Morbid obesity: Educate on importance of diet and weight loss to help promote overall health. Encourage side lying when in bed.  9. Pre-renal azotemia vs new CKD Stage IIIa: Improving off tube feeds--encourage fluid intake.              Creatinine 1.48 on 2/3, labs ordered for Monday  2/7- Cr up to 1.57 again- Im wondering if has a new baseline- because is always there- ~ 1.4-1.5 unless gets IVFs-  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              Hemoglobin 9.5 on 2/3  overall stable- con't regimen 11. Encephalopathy: Has resolved. Continue Seroquel to help with sleep hygeine.  12.  Dysphagia: Resolved  2/2- PEG was placed 12/30- so cannot be removed til minimum 3/1  13. Recent trach: Closed  14.  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  Lyrica 50 mg TID_ explained won't help numbness/weakness, only nerve pain.  2/7- helping nerve pain a lot- con't regimen 15. Long COVID  Covid vaccine received on 2/4  2/7- can get another vaccine- the second shot next Friday.  16. Muscle spasms  2/8- will try Skelaxin 800 mg TID prn for muscle spasms- since c/o severe muscle tightness   LOS: 13 days A FACE TO FACE EVALUATION WAS PERFORMED  Cleva Camero 12/31/2020, 8:31 AM

## 2020-12-31 NOTE — Progress Notes (Signed)
Patient ID: Jackson Figueroa, male   DOB: February 05, 1961, 60 y.o.   MRN: [id number]  SW met with pt in room to provide updates from team conference, and d/c date 3/1. SW to continue to follow pt for d/c needs.   Loralee Pacas, MSW, [place] Office: [phone] Cell: [phone] Fax: [phone]

## 2020-12-31 NOTE — Progress Notes (Signed)
Occupational Therapy Session Note  Patient Details  Name: Tramain Gershman MRN: [id number] Date of Birth: [birth year]  Today's Date: 12/31/2020 OT Individual Time: 1100-1200 OT Individual Time Calculation (min): 60 min    Short Term Goals: Week 1:  OT Short Term Goal 1 (Week 1): Pt will be able to tolerate standing in [place] stedy for at least 3 minutes to demonstrate improved endurance. OT Short Term Goal 1 - Progress (Week 1): Progressing toward goal OT Short Term Goal 2 (Week 1): Pt will be able to don and doff pants over hips using a bridge in bed from supine. OT Short Term Goal 2 - Progress (Week 1): Progressing toward goal OT Short Term Goal 3 (Week 1): Pt will be able to reach R arm back behind his hips to be able to adjust pants and increase potential for full cleansing. OT Short Term Goal 3 - Progress (Week 1): Progressing toward goal OT Short Term Goal 4 (Week 1): Pt will don shirt with min A. OT Short Term Goal 4 - Progress (Week 1): Met Week 2:  OT Short Term Goal 1 (Week 2): Pt will be able to tolerate standing in [place] stedy for at least 3 minutes to demonstrate improved endurance. OT Short Term Goal 2 (Week 2): Pt will be able to don and doff pants over hips using a bridge in bed from supine. OT Short Term Goal 3 (Week 2): Pt will be able to reach R arm back behind his hips to be able to adjust pants and increase potential for full cleansing.  Skilled Therapeutic Interventions/Progress Updates:    Patient in bed, alert and aware of needs.  He changed pants bed level with min A.  Supine to sitting edge of bed with CGA.  Utilized sara stedy for transfer bed to/from w/c - he is able to assist with set up and direct use.  Patient able to propel w/c to therapy gym at slow rate.  SB transfer to mat table with min A.  He tolerated unsupported sitting with trunk/pelvic mobility, posture, proximal stability, shoulder and elbow ROM/weight bearing/stretching activities.  SB transfer back to w/c minA.   Returned to bed via sara stedy.  Able to get both legs into bed and position himself.  Reviewed shoulder and scapular strengthening activities that he can do on his own both w/c and bed level.  Call bell and tray table in reach.    Therapy Documentation Precautions:  Precautions Precautions: Fall Precaution Comments: PEG, sacral and toe wounds Restrictions Weight Bearing Restrictions: No Therapy/Group: Individual Therapy  Carlos Levering 12/31/2020, 7:44 AM

## 2020-12-31 NOTE — Telephone Encounter (Signed)
[facility]- they insisted that I had to give an exact date for pt to go back to work- [place]! He will not even leave hospital until 3/3 at the earliest.  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   He said he couldn't put that long on paperwork.  I explained to say a few months is incorrect- and I need to be truthful about his condition.  He said he's approve it until beginning of March 2022- and then send paperwork back to me to fill out again.  I explained that it's a waste  Of my time and doctor's time to fil this out monthly, when 90+% of insurance/disability claims send paperwork every 3-6 months , sometimes 1 year- he disagreed and told me it was "my job".  Claims person's name is Neta Ehlers.  He was insistent we couldn't put "TBD" since we had to put a date, but then he didn't like the date I explained was needed- 1 year.  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

## 2021-01-01 MED ORDER — CEPHALEXIN 250 MG PO CAPS
500.0000 mg | ORAL_CAPSULE | Freq: Two times a day (BID) | ORAL | Status: AC
  Administered 2021-01-01 – 2021-01-04 (×6): 500 mg via ORAL
  Filled 2021-01-01 (×6): qty 2

## 2021-01-01 NOTE — Progress Notes (Signed)
Physical Therapy Session Note  Patient Details  Name: Jackson Figueroa MRN: [id number] Date of Birth: [birth year]  Today's Date: 01/01/2021 PT Individual Time: 3491-7915 PT Individual Time Calculation (min): 53 min   Short Term Goals: Week 2:  PT Short Term Goal 1 (Week 2): Pt will tolerate standing x 5 min with LRAD PT Short Term Goal 2 (Week 2): Pt will complete least restrictive transfer with mod A consistently PT Short Term Goal 3 (Week 2): Pt will initiate gait training as safe and able  Skilled Therapeutic Interventions/Progress Updates:    Pt received sitting EOB and agreeable to therapy session. Donned shoes min assist. Sit>stand EOB>Sara lift dependently. Pt agreeable to focus on B LE strengthening in [place] via squats so loosened harness and performed 2 sets of ~5 reps mini-squats using BUE support on Sara. Pt reports increasing knee discomfort and LE fatigue therefore performed dependent [name] lift transfer to w/c. Pt able to manage w/c leg rests without assist but increased time. B UE w/c propulsion ~183ft 2x to/from day room with distant supervision - pt reports improving with this task due to increased B UE strength - continues to demo slower push stroke that is more pronounced on way back to room at end of session due to increasing fatigue. Performed B LE strengthening using kinetron against 20cm/sec resistance for 3 sets of 2 minutes - attempted to perform 3rd set with hip/trunk flexed to promote increased glute activation but pt reports some low back discomfort with this therefore returned to midline upright posture. Pt requesting to return to bed to eat meal. [name] lift w/c>EOB dependently though cuing for pt participation as much as possible for B LE strengthening. Pt left sitting on EOB with needs in reach and meal tray set-up.  Therapy Documentation Precautions:  Precautions Precautions: Fall Precaution Comments: PEG, sacral and toe wounds Restrictions Weight Bearing Restrictions:  No  Pain: Reports some "soreness" in bilateral knees with more specific pain located along L patellar tendon during mini-squats in the [place] lift - provided seated rest breaks and adjusted height of shin pad support on Sara to increase comfort during this task - reported some relief.    Therapy/Group: Individual Therapy  Tawana Scale , PT, DPT, CSRS  01/01/2021, 6:41 PM

## 2021-01-01 NOTE — Progress Notes (Signed)
Occupational Therapy Session Note  Patient Details  Name: Jackson Figueroa MRN: [id number] Date of Birth: 1961-01-02  Today's Date: 01/01/2021 OT Individual Time: 1000-1100 OT Individual Time Calculation (min): 60 min    Short Term Goals: Week 2:  OT Short Term Goal 1 (Week 2): Pt will be able to tolerate standing in [place] stedy for at least 3 minutes to demonstrate improved endurance. OT Short Term Goal 2 (Week 2): Pt will be able to don and doff pants over hips using a bridge in bed from supine. OT Short Term Goal 3 (Week 2): Pt will be able to reach R arm back behind his hips to be able to adjust pants and increase potential for full cleansing.  Skilled Therapeutic Interventions/Progress Updates:    Pt sitting EOB upon arrival. Transfer to w/c with [name]. Grooming at sink before propelling to w/c. Pt transported to ortho gym for time management. Pt practiced SB transfers and scoot transfers w/c<>EOM and EOM<>BSC. Pt practiced lateral leans and reaching to buttocks to simulate toileting tasks. Transfers with CGA. W/c propulsion with supervision. Pt returned to room and transferred back to bed with [name] Plus  Therapy Documentation Precautions:  Precautions Precautions: Fall Precaution Comments: PEG, sacral and toe wounds Restrictions Weight Bearing Restrictions: No   Pain: Pain Assessment Pain Scale: 0-10 Pain Score: 0-No pain     Therapy/Group: Individual Therapy  [name] 01/01/2021, 12:01 PM

## 2021-01-01 NOTE — Progress Notes (Signed)
Physical Therapy Session Note  Patient Details  Name: Jackson Figueroa MRN: [id number] Date of Birth: Jul 21, 1961  Today's Date: 01/01/2021 PT Individual Time: 1345-1455 PT Individual Time Calculation (min): 70 min   Short Term Goals: Week 2:  PT Short Term Goal 1 (Week 2): Pt will tolerate standing x 5 min with LRAD PT Short Term Goal 2 (Week 2): Pt will complete least restrictive transfer with mod A consistently PT Short Term Goal 3 (Week 2): Pt will initiate gait training as safe and able  Skilled Therapeutic Interventions/Progress Updates:    Pt received seated EOB, agreeable to PT session. No complaints of pain at rest, has onset of B knee pain during session. Provided ice packs at end of session for pain management. [name] transfer bed to w/c. Manual w/c propulsion x 125 ft with use of BUE at Supervision level. Pt requires some assist for management of w/c parts. Squat pivot transfer w/c to mat table with min A with poor clearance of buttocks during transfer. Session focus on use of LiteGait for standing and weight bearing through BLE for gait initiation. Pt able to tolerate standing x 5 reps with LiteGait with some assist from sling needed to achieve standing. While in standing pt able to take a few steps forward/backwards but not perform actual ambulation at this time due to impaired endurance and fear of knees buckling. Pt exhibits poor LE clearance while taking steps. Standing alt L/R marches in LiteGait, 2 x 5 reps to fatigue with good LE clearance noted. Pt's standing tolerance remains limited by decreased endurance and quick onset of fatigue in standing position. Slide board transfer back to w/c with CGA. [name] transfer back to bed. Sit to supine Supervision. Pt left semi-reclined in bed with needs in reach at end of session, ice packs to B knees and R shoulder for pain management.  Therapy Documentation Precautions:  Precautions Precautions: Fall Precaution Comments: PEG, sacral and  toe wounds Restrictions Weight Bearing Restrictions: No    Therapy/Group: Individual Therapy   Excell Seltzer, PT, DPT  01/01/2021, 5:10 PM

## 2021-01-01 NOTE — Progress Notes (Signed)
Occupational Therapy Weekly Progress Note  Patient Details  Name: Jackson Figueroa MRN: [id number] Date of Birth: 1961/06/16  Beginning of progress report period: December 25, 2020 End of progress report period: January 01, 2021  Patient has met 2 of 3 short term goals.  Pt making steady progress with BADLs and functional transfers. SB and scoot/squat transfers w/c<>EOM and w/c<>DABSC with min A. Bathing/dressing seated in w/c or EOB with min A. Pt using RUE/hand at diminished level. Supine<>sit EOB and EOM with supervision.   Patient continues to demonstrate the following deficits: muscle weakness, decreased cardiorespiratoy endurance and decreased oxygen support, impaired timing and sequencing and decreased coordination and decreased standing balance, decreased balance strategies and difficulty maintaining precautions and therefore will continue to benefit from skilled OT intervention to enhance overall performance with BADL and Reduce care partner burden.  Patient progressing toward long term goals..  Continue plan of care.  OT Short Term Goals Week 2:  OT Short Term Goal 1 (Week 2): Pt will be able to tolerate standing in [place] stedy for at least 3 minutes to demonstrate improved endurance. OT Short Term Goal 1 - Progress (Week 2): Progressing toward goal OT Short Term Goal 2 (Week 2): Pt will be able to don and doff pants over hips using a bridge in bed from supine. OT Short Term Goal 2 - Progress (Week 2): Met OT Short Term Goal 3 (Week 2): Pt will be able to reach R arm back behind his hips to be able to adjust pants and increase potential for full cleansing. OT Short Term Goal 3 - Progress (Week 2): Met Week 3:  OT Short Term Goal 1 (Week 3): Pt will be able to tolerate standing in [place] stedy for at least 3 minutes to demonstrate improved endurance. OT Short Term Goal 2 (Week 3): Pt will doff/don pants utilizing lateral leans while seated EOB or on BSC with min A OT Short Term Goal 3 (Week 3):  Pt will perform DABSC transfer with CGA OT Short Term Goal 4 (Week 3): Pt will perform toilet hygiene with min A   [name] 01/01/2021, 3:06 PM

## 2021-01-01 NOTE — Progress Notes (Signed)
[place] PHYSICAL MEDICINE & REHABILITATION PROGRESS NOTE   Subjective/Complaints:  Pt reports ibuprofen very helpful, explained cannot use very often due to kidney issues and bleeding risk.  Pt also somewhat concerned about some purulent drainage from trach site- just started.  D/w pt doing Keflex x3 days and will monitor- might need something different, but will see.    LBM yesterday   ROS:   Pt denies SOB, abd pain, CP, N/V/C/D, and vision changes     Objective:   No results found. Recent Labs    12/30/20 0646  WBC 6.2  HGB 9.2*  HCT 28.3*  PLT 210   Recent Labs    12/30/20 0646  NA 140  K 4.2  CL 106  CO2 25  GLUCOSE 90  BUN 26*  CREATININE 1.57*  CALCIUM 9.1    Intake/Output Summary (Last 24 hours) at 01/01/2021 1646 Last data filed at 01/01/2021 1330 Gross per 24 hour  Intake 900 ml  Output 4250 ml  Net -3350 ml     Pressure Injury 11/18/20 Toe (Comment  which one) Anterior;Right Deep Tissue Pressure Injury - Purple or maroon localized area of discolored intact skin or blood-filled blister due to damage of underlying soft tissue from pressure and/or shear. DTI left  (Active)  11/18/20 2330  Location: Toe (Comment  which one) (LEFT big toe)  Location Orientation: Anterior;Right  Staging: Deep Tissue Pressure Injury - Purple or maroon localized area of discolored intact skin or blood-filled blister due to damage of underlying soft tissue from pressure and/or shear.  Wound Description (Comments): DTI left toe  Present on Admission: Yes (on admit to 3M)    Physical Exam: Vital Signs Blood pressure 103/75, pulse (!) 102, temperature 98.3 F (36.8 C), resp. rate 18, height 6' (1.829 m), weight 110 kg, SpO2 97 %. Constitutional: awake, alert, sitting up somewhat in bedside chair, NAD HENT: Normocephalic.  Atraumatic. Lurline Idol site is almost healed, but has some scabs AND a little drop of purulence at space still slightly open Eyes: EOMI. No  discharge. Cardiovascular: borderline tachycardia- regular rhythm Respiratory: CTA B/L- no W/R/R- good air movement GI: Soft, NT, ND, (+)BS  Skin: Warm and dry.   Sacrum not examined today- in chair Psych: appropriate Musc: No edema in extremities.  No tenderness in extremities. Neuro: Ox3 Right ulnar neuropathy- numbness in lateral 4th/5th digit (more in 5th digit numbness) on R with intrinsic atrophy- no change at all today Motor: Right upper extremity: Shoulder abduction 4 --4/5, distally 3/5 Left upper extremity: 4/5 proximal distal Bilateral lower extremities: Hip flexion, knee extension 3 -/5, ankle dorsiflexion 4/5  Assessment/Plan: 1. Functional deficits which require 3+ hours per day of interdisciplinary therapy in a comprehensive inpatient rehab setting.  Physiatrist is providing close team supervision and 24 hour management of active medical problems listed below.  Physiatrist and rehab team continue to assess barriers to discharge/monitor patient progress toward functional and medical goals  Care Tool:  Bathing    Body parts bathed by patient: Right arm,Left arm,Chest,Abdomen,Front perineal area,Right upper leg,Left upper leg,Right lower leg,Left lower leg,Face   Body parts bathed by helper: Buttocks     Bathing assist Assist Level: Minimal Assistance - Patient > 75%     Upper Body Dressing/Undressing Upper body dressing   What is the patient wearing?: Pull over shirt    Upper body assist Assist Level: Supervision/Verbal cueing    Lower Body Dressing/Undressing Lower body dressing      What is the patient  wearing?: Pants     Lower body assist Assist for lower body dressing: Moderate Assistance - Patient 50 - 74%     Toileting Toileting    Toileting assist Assist for toileting: Independent with assistive device Assistive Device Comment: Urinal   Transfers Chair/bed transfer  Transfers assist  Chair/bed transfer activity did not occur:  Safety/medical concerns (due to weakness/fatigue)  Chair/bed transfer assist level: Dependent - mechanical lift     Locomotion Ambulation   Ambulation assist   Ambulation activity did not occur: Safety/medical concerns (due to weakness/fatigue)          Walk 10 feet activity   Assist  Walk 10 feet activity did not occur: Safety/medical concerns (due to weakness/fatigue)        Walk 50 feet activity   Assist Walk 50 feet with 2 turns activity did not occur: Safety/medical concerns (due to weakness/fatigue)         Walk 150 feet activity   Assist Walk 150 feet activity did not occur: Safety/medical concerns (due to weakness/fatigue)         Walk 10 feet on uneven surface  activity   Assist Walk 10 feet on uneven surfaces activity did not occur: Safety/medical concerns (due to weakness/fatigue)         Wheelchair     Assist Will patient use wheelchair at discharge?: Yes Type of Wheelchair: Manual Wheelchair activity did not occur: Safety/medical concerns (due to weakness/fatigue)  Wheelchair assist level: Supervision/Verbal cueing Max wheelchair distance: 125'    Wheelchair 50 feet with 2 turns activity    Assist    Wheelchair 50 feet with 2 turns activity did not occur: Safety/medical concerns (due to weakness/fatigue)   Assist Level: Supervision/Verbal cueing   Wheelchair 150 feet activity     Assist  Wheelchair 150 feet activity did not occur: Safety/medical concerns (due to weakness/fatigue)   Assist Level: Supervision/Verbal cueing   Blood pressure 103/75, pulse (!) 102, temperature 98.3 F (36.8 C), resp. rate 18, height 6' (1.829 m), weight 110 kg, SpO2 97 %.    Medical Problem List and Plan: 1.  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  Antithrombotics: -DVT/anticoagulation:   Pharmaceutical: Lovenox             -antiplatelet therapy: N/A 3. Pain Management: tylenol prn  tramadol 50 mg q6 hours prn  Continue Lyrica  Controlled meds on 2/5  2/7- Lyrica helping nerve pain a lot and tramadol helping all over body aches- con't regimen  2/8- asked for Ibuprofen 600 mg TID prn- explained could increase bleeding risk - he still wants it- ordered  2/9- changed to 2x/day max but can be given q8 hours- also went over again about kidney issues- will con't for now 4. Mood: LCSW to follow for evaluation and support.              -antipsychotic agents: Seroquel 25 5. Neuropsych: This patient is capable of making decisions on his own behalf. 6. Skin/Wound Care:                          DTI with MASD: areas on buttocks improving.  Zinc for moisture barrier as well as pressure relief measures.   PEG flushes 7. Fluids/Electrolytes/Nutrition: Monitor I/O. Encourage fluid intake. Continue to monitor lytes with  serial checks.  8. Morbid obesity: Educate on importance of diet and weight loss to help promote overall health. Encourage side lying when in bed.  9. Pre-renal azotemia vs new CKD Stage IIIa: Improving off tube feeds--encourage fluid intake.              Creatinine 1.48 on 2/3, labs ordered for Monday  2/7- Cr up to 1.57 again- Im wondering if has a new baseline- because is always there- ~ 1.4-1.5 unless gets IVFs-   2/9- will recheck in AM and see how doing on Ibuprofen 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              Hemoglobin 9.5 on 2/3  overall stable- con't regimen 11. Encephalopathy: Has resolved. Continue Seroquel to help with sleep hygeine.  12.  Dysphagia: Resolved  2/2- PEG was placed 12/30- so cannot be removed til minimum 3/1  13. Recent trach: Closed  2/9- has some trach site drainage- appears purulent- will start Keflex 500 mg BID (per CrCl) x 3 days- and see how it goes  14.  Right ulnar neuropathy appears to be chronic but  worsened since his hospitalization.  Lyrica 50 mg TID_ explained won't help numbness/weakness, only nerve pain.  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 Muscle spasms  2/8- will try Skelaxin 800 mg TID prn for muscle spasms- since c/o severe muscle tightness  2/9- didn't complain of muscle spasms today   LOS: 14 days A FACE TO FACE EVALUATION WAS PERFORMED  Yanelli Zapanta 01/01/2021, 4:46 PM

## 2021-01-02 LAB — CBC WITH DIFFERENTIAL/PLATELET
Abs Immature Granulocytes: 0.05 10*3/uL (ref 0.00–0.07)
Basophils Absolute: 0 10*3/uL (ref 0.0–0.1)
Basophils Relative: 1 %
Eosinophils Absolute: 0.3 10*3/uL (ref 0.0–0.5)
Eosinophils Relative: 5 %
HCT: 26 % — ABNORMAL LOW (ref 39.0–52.0)
Hemoglobin: 8.8 g/dL — ABNORMAL LOW (ref 13.0–17.0)
Immature Granulocytes: 1 %
Lymphocytes Relative: 24 %
Lymphs Abs: 1.4 10*3/uL (ref 0.7–4.0)
MCH: 33.6 pg (ref 26.0–34.0)
MCHC: 33.8 g/dL (ref 30.0–36.0)
MCV: 99.2 fL (ref 80.0–100.0)
Monocytes Absolute: 0.7 10*3/uL (ref 0.1–1.0)
Monocytes Relative: 12 %
Neutro Abs: 3.4 10*3/uL (ref 1.7–7.7)
Neutrophils Relative %: 57 %
Platelets: 229 10*3/uL (ref 150–400)
RBC: 2.62 MIL/uL — ABNORMAL LOW (ref 4.22–5.81)
RDW: 13.9 % (ref 11.5–15.5)
WBC: 5.9 10*3/uL (ref 4.0–10.5)
nRBC: 0 % (ref 0.0–0.2)

## 2021-01-02 LAB — BASIC METABOLIC PANEL
Anion gap: 9 (ref 5–15)
BUN: 26 mg/dL — ABNORMAL HIGH (ref 6–20)
CO2: 25 mmol/L (ref 22–32)
Calcium: 8.9 mg/dL (ref 8.9–10.3)
Chloride: 105 mmol/L (ref 98–111)
Creatinine, Ser: 1.51 mg/dL — ABNORMAL HIGH (ref 0.61–1.24)
GFR, Estimated: 54 mL/min — ABNORMAL LOW (ref >60)
Glucose, Bld: 78 mg/dL (ref 70–99)
Potassium: 4 mmol/L (ref 3.5–5.1)
Sodium: 139 mmol/L (ref 135–145)

## 2021-01-02 MED ORDER — ZINC OXIDE 11.3 % EX CREA
TOPICAL_CREAM | Freq: Three times a day (TID) | CUTANEOUS | Status: DC
  Filled 2021-01-02: qty 56

## 2021-01-02 MED ORDER — BARRIER CREAM NON-SPECIFIED: 1.0000 "application " | TOPICAL_CREAM | Freq: Three times a day (TID) | TOPICAL | Status: DC

## 2021-01-02 NOTE — Progress Notes (Signed)
Physical Therapy Session Note  Patient Details  Name: Jackson Figueroa MRN: [id number] Date of Birth: 30-Jun-1961  Today's Date: 01/02/2021 PT Individual Time: [phone] and 1216-2446 PT Individual Time Calculation (min): 60 min and 45 min  Short Term Goals: Week 2:  PT Short Term Goal 1 (Week 2): Pt will tolerate standing x 5 min with LRAD PT Short Term Goal 2 (Week 2): Pt will complete least restrictive transfer with mod A consistently PT Short Term Goal 3 (Week 2): Pt will initiate gait training as safe and able  Skilled Therapeutic Interventions/Progress Updates: Pt presented at EOB agreeable to therapy. Pt states currently R hand and BLE feel "good", rest breaks provided as needed throughout session. Performed [name] transfer to w/c for time management and propelled to ortho gym after MD assessment for time management and energy conservation. Pt participated in Standing frame for 7 min and 5 min bouts with some slack on sling to allow for increased BLE recruitment. Pt was able to perform 2 x 10 TKE and alternating TKE x 10 bilaterally as well as small wt shifting forward backward with sling support. Pt propelled from ortho gym to 4W nsg station with supervision and x 2 rest breaks. Pt transported back to room and requesting to use BSC. Performed [name] transfer to [name] and pt left awaiting OT session with call bell within reach and current needs met.   Tx2: Pt presented at EOB agreeable to therapy. Pt c/o some B knee soreness but no intervention needed. Performed [name] transfer to w/c and transported pt outside to [name] entrance. Pt appreciative as has not been outside since admitted to hospital. PTA discussed progression of rehab and potential barriers in home entry due to stairs (pt showed picture of front entrance to house). Pt performed short bouts of propulsion outside for endurance but due to current level of debility unable to continuously propel. Pt transported back to room at end of session and  performed [name] transfer back to bed. Pt left seated EOB at end of session with call bell within reach and current needs met.      Therapy Documentation Precautions:  Precautions Precautions: Fall Precaution Comments: PEG, sacral and toe wounds Restrictions Weight Bearing Restrictions: No General:     Therapy/Group: Individual Therapy  Christopher Glasscock  Dailey Alberson, PTA  01/02/2021, 12:36 PM

## 2021-01-02 NOTE — Progress Notes (Signed)
Occupational Therapy Session Note  Patient Details  Name: Jackson Figueroa MRN: [id number] Date of Birth: July 12, 1961  Today's Date: 01/02/2021 OT Individual Time: 0900-1030 OT Individual Time Calculation (min): 90 min    Short Term Goals: Week 3:  OT Short Term Goal 1 (Week 3): Pt will be able to tolerate standing in [place] stedy for at least 3 minutes to demonstrate improved endurance. OT Short Term Goal 2 (Week 3): Pt will doff/don pants utilizing lateral leans while seated EOB or on BSC with min A OT Short Term Goal 3 (Week 3): Pt will perform DABSC transfer with CGA OT Short Term Goal 4 (Week 3): Pt will perform toilet hygiene with min A  Skilled Therapeutic Interventions/Progress Updates:    OT intervention with focus on bathing at shower level, dressing with sit<>stand with [name] Plus, scoot transfers/SB transfers, sitting balance, and BUE activities with emphasis on use of BUE >90. Bathing with min A in rolling shower chair. Pt required use of [name] Plus to stand to pull pants over hips but completed all other tasks with supervision. Rolling shower chair utilized to access shower. Pt propelled w/c to day room and transferred to [facility] with supervision. Pt engaged in ball tapping activity using 3# bar to tap ball tossed to him. Pt performed task 3x15. Pt used SB to return to w/c. SB transfer with supervision. Pt returned to room and used [name] Plus to return to bed. Sit>supine with supervisoin. Pt remained in bed with all needs within reach.   Therapy Documentation Precautions:  Precautions Precautions: Fall Precaution Comments: PEG, sacral and toe wounds Restrictions Weight Bearing Restrictions: No  Pain:  Pt stated ibuprofen was helping, especially with his Rt hand      Therapy/Group: Individual Therapy  [name] 01/02/2021, 12:22 PM

## 2021-01-02 NOTE — Progress Notes (Signed)
[facility] PHYSICAL MEDICINE & REHABILITATION PROGRESS NOTE   Subjective/Complaints:   Pt reports he wants [place] to look at his backside to see if can come out of specialty bed- it's inflated a little more, but still uncomfortable.   Nerve pain much better this AM after Ibuprofen and Tramadol last night. LBM yesterday.   Has shower scheduled ~ 10-10:30 am- will try to look then.   ROS:   Pt denies SOB, abd pain, CP, N/V/C/D, and vision changes    Objective:   No results found. Recent Labs    01/02/21 0448  WBC 5.9  HGB 8.8*  HCT 26.0*  PLT 229   Recent Labs    01/02/21 0448  NA 139  K 4.0  CL 105  CO2 25  GLUCOSE 78  BUN 26*  CREATININE 1.51*  CALCIUM 8.9    Intake/Output Summary (Last 24 hours) at 01/02/2021 1042 Last data filed at 01/02/2021 1275 Gross per 24 hour  Intake 1096 ml  Output 6428 ml  Net -5332 ml     Pressure Injury 11/18/20 Toe (Comment  which one) Anterior;Right Deep Tissue Pressure Injury - Purple or maroon localized area of discolored intact skin or blood-filled blister due to damage of underlying soft tissue from pressure and/or shear. DTI left  (Active)  11/18/20 2330  Location: Toe (Comment  which one) (LEFT big toe)  Location Orientation: Anterior;Right  Staging: Deep Tissue Pressure Injury - Purple or maroon localized area of discolored intact skin or blood-filled blister due to damage of underlying soft tissue from pressure and/or shear.  Wound Description (Comments): DTI left toe  Present on Admission: Yes (on admit to 61M)    Physical Exam: Vital Signs Blood pressure (!) 87/61, pulse 71, temperature 97.9 F (36.6 C), resp. rate 18, height 6' (1.829 m), weight 110 kg, SpO2 96 %. Constitutional: sitting up in w/c heading to gym, PT in room, NAD HENT: Normocephalic.  Atraumatic. Trach side covered C/D/I Eyes: EOMI. No discharge. Cardiovascular: RRR_ no JVD Respiratory: CTA B/L- no W/R/R- good air movement GI: Soft, NT, ND,  (+)BS  Skin: Warm and dry.   Sacrum not examined today- in chair Psych: appropriate Musc: No edema in extremities.  No tenderness in extremities. Neuro: Ox3 Right ulnar neuropathy- numbness in lateral 4th/5th digit (more in 5th digit numbness) on R with intrinsic atrophy- no change at all today Motor: Right upper extremity: Shoulder abduction 4 --4/5, distally 3/5 Left upper extremity: 4/5 proximal distal Bilateral lower extremities: Hip flexion, knee extension 3 -/5, ankle dorsiflexion 4/5  Assessment/Plan: 1. Functional deficits which require 3+ hours per day of interdisciplinary therapy in a comprehensive inpatient rehab setting.  Physiatrist is providing close team supervision and 24 hour management of active medical problems listed below.  Physiatrist and rehab team continue to assess barriers to discharge/monitor patient progress toward functional and medical goals  Care Tool:  Bathing    Body parts bathed by patient: Right arm,Left arm,Chest,Abdomen,Front perineal area,Right upper leg,Left upper leg,Right lower leg,Left lower leg,Face   Body parts bathed by helper: Buttocks     Bathing assist Assist Level: Minimal Assistance - Patient > 75%     Upper Body Dressing/Undressing Upper body dressing   What is the patient wearing?: Pull over shirt    Upper body assist Assist Level: Supervision/Verbal cueing    Lower Body Dressing/Undressing Lower body dressing      What is the patient wearing?: Pants     Lower body assist Assist for lower  body dressing: Moderate Assistance - Patient 50 - 74%     Toileting Toileting    Toileting assist Assist for toileting: Independent with assistive device Assistive Device Comment: Urinal   Transfers Chair/bed transfer  Transfers assist  Chair/bed transfer activity did not occur: Safety/medical concerns (due to weakness/fatigue)  Chair/bed transfer assist level: Dependent - mechanical lift [name])      Locomotion Ambulation   Ambulation assist   Ambulation activity did not occur: Safety/medical concerns (due to weakness/fatigue)          Walk 10 feet activity   Assist  Walk 10 feet activity did not occur: Safety/medical concerns (due to weakness/fatigue)        Walk 50 feet activity   Assist Walk 50 feet with 2 turns activity did not occur: Safety/medical concerns (due to weakness/fatigue)         Walk 150 feet activity   Assist Walk 150 feet activity did not occur: Safety/medical concerns (due to weakness/fatigue)         Walk 10 feet on uneven surface  activity   Assist Walk 10 feet on uneven surfaces activity did not occur: Safety/medical concerns (due to weakness/fatigue)         Wheelchair     Assist Will patient use wheelchair at discharge?: Yes Type of Wheelchair: Manual Wheelchair activity did not occur: Safety/medical concerns (due to weakness/fatigue)  Wheelchair assist level: Supervision/Verbal cueing Max wheelchair distance: 125'    Wheelchair 50 feet with 2 turns activity    Assist    Wheelchair 50 feet with 2 turns activity did not occur: Safety/medical concerns (due to weakness/fatigue)   Assist Level: Supervision/Verbal cueing   Wheelchair 150 feet activity     Assist  Wheelchair 150 feet activity did not occur: Safety/medical concerns (due to weakness/fatigue)   Assist Level: Supervision/Verbal cueing   Blood pressure (!) 87/61, pulse 71, temperature 97.9 F (36.6 C), resp. rate 18, height 6' (1.829 m), weight 110 kg, SpO2 96 %.    Medical Problem List and Plan: 1.  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  Antithrombotics: -DVT/anticoagulation:  Pharmaceutical: Lovenox             -antiplatelet therapy: N/A 3. Pain Management: tylenol prn  tramadol 50 mg q6 hours  prn  Continue Lyrica  Controlled meds on 2/5  2/7- Lyrica helping nerve pain a lot and tramadol helping all over body aches- con't regimen  2/8- asked for Ibuprofen 600 mg TID prn- explained could increase bleeding risk - he still wants it- ordered  2/9- changed to 2x/day max but can be given q8 hours- also went over again about kidney issues- will con't for now  2/10- said ibuprofen is helping R wrist pain more than anything, esp when combined with tramadol 4. Mood: LCSW to follow for evaluation and support.              -antipsychotic agents: Seroquel 25 5. Neuropsych: This patient is capable of making decisions on his own behalf. 6. Skin/Wound Care:                          DTI with MASD: areas on buttocks improving.  Zinc for moisture barrier as well as pressure relief measures.  2/10- will try to get off specialty bed- looking  at backside when not in therapy/in w/c.    PEG flushes 7. Fluids/Electrolytes/Nutrition: Monitor I/O. Encourage fluid intake. Continue to monitor lytes with serial checks.  8. Morbid obesity: Educate on importance of diet and weight loss to help promote overall health. Encourage side lying when in bed.  9. Pre-renal azotemia vs new CKD Stage IIIa: Improving off tube feeds--encourage fluid intake.              Creatinine 1.48 on 2/3, labs ordered for Monday  2/7- Cr up to 1.57 again- Im wondering if has a new baseline- because is always there- ~ 1.4-1.5 unless gets IVFs-   2/9- will recheck in AM and see how doing on Ibuprofen  2/10- Cr 1.51- stable and BUN 26- stable - likely Cr ~ 1.50 is new baseline 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              Hemoglobin 9.5 on 2/3  overall stable- con't regimen 11. Encephalopathy: Has resolved. Continue Seroquel to help with sleep hygeine.  12.  Dysphagia: Resolved  2/2- PEG was placed 12/30- so cannot be removed til minimum 3/1  13. Recent trach: Closed  2/9- has some trach site  drainage- appears purulent- will start Keflex 500 mg BID (per CrCl) x 3 days- and see how it goes  2/10- no drainage seen this AM, but will con't to monitor- WBC is better at 5.9k  14.  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  Lyrica 50 mg TID_ explained won't help numbness/weakness, only nerve pain.  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 Muscle spasms  2/8- will try Skelaxin 800 mg TID prn for muscle spasms- since c/o severe muscle tightness  2/9- didn't complain of muscle spasms today   LOS: 15 days A FACE TO FACE EVALUATION WAS PERFORMED  Jackson Figueroa 01/02/2021, 10:42 AM

## 2021-01-02 NOTE — Plan of Care (Signed)
  Problem: RH SKIN INTEGRITY Goal: RH STG SKIN FREE OF INFECTION/BREAKDOWN Description: Skin free of breakdown and infection with min assist to supervision assistance. Outcome: Progressing Goal: RH STG ABLE TO PERFORM INCISION/WOUND CARE W/ASSISTANCE Description: STG Able To Perform Incision/Wound Care With min Assistance to supervision assistance. Outcome: Progressing   Problem: RH KNOWLEDGE DEFICIT GENERAL Goal: RH STG INCREASE KNOWLEDGE OF SELF CARE AFTER HOSPITALIZATION Outcome: Progressing   Problem: Consults Goal: RH GENERAL PATIENT EDUCATION Description: See Patient Education module for education specifics. Outcome: Progressing Goal: Skin Care Protocol Initiated - if Braden Score 18 or less Description: If consults are not indicated, leave blank or document N/A Outcome: Progressing

## 2021-01-03 NOTE — Progress Notes (Signed)
Pt. Refused Metoprolol at HS. Pt. Stated he runs low blood pressure. BP105/79, P79. Educated patient regarding risk of HTN.

## 2021-01-03 NOTE — Progress Notes (Signed)
Physical Therapy Weekly Progress Note  Patient Details  Name: Taisei Bonnette MRN: [id number] Date of Birth: December 23, 1960  Beginning of progress report period: December 27, 2020 End of progress report period: January 03, 2021   Patient has met 1 of 3 short term goals.  Pt has continued to make progress during this current week of therapy. Pt has demonstrated improvement in bed mobility and is able to perform slide board transfers with minA. Pt continues to demonstrate significant weakness in BLE but was able to ambulate ~2f in Lite Gait before knee buckles with due to fatigue. Pt has been able to perform STS from elevated mat with RW with modA, and tolerate standing frame up to 7 min incorporating weight shifting activities.   Patient continues to demonstrate the following deficits muscle weakness and muscle joint tightness and decreased cardiorespiratoy endurance and therefore will continue to benefit from skilled PT intervention to increase functional independence with mobility.  Patient progressing toward long term goals..  Continue plan of care.  PT Short Term Goals Week 2:  PT Short Term Goal 1 (Week 2): Pt will tolerate standing x 5 min with LRAD PT Short Term Goal 1 - Progress (Week 2): Partly met PT Short Term Goal 2 (Week 2): Pt will complete least restrictive transfer with mod A consistently PT Short Term Goal 2 - Progress (Week 2): Met PT Short Term Goal 3 (Week 2): Pt will initiate gait training as safe and able PT Short Term Goal 3 - Progress (Week 2): Partly met Week 3:  PT Short Term Goal 1 (Week 3): Pt will be able to perform sit <> stands with max assist with LRAD PT Short Term Goal 2 (Week 3): Pt will be able to gait with lift equipment x 20' PT Short Term Goal 3 (Week 3): Pt will be able to perform bed <> w/c transfers without use of lift equipment with min/mod assist  Skilled Therapeutic Interventions/Progress Updates:  Ambulation/gait training;Discharge planning;Functional  mobility training;Psychosocial support;Therapeutic Activities;Balance/vestibular training;Disease management/prevention;Neuromuscular re-education;Skin care/wound management;Therapeutic Exercise;Wheelchair propulsion/positioning;Cognitive remediation/compensation;DME/adaptive equipment instruction;Pain management;Splinting/orthotics;UE/LE Strength taining/ROM;Community reintegration;Functional electrical stimulation;Patient/family education;Stair training;UE/LE Coordination activities   Therapy Documentation Precautions:  Precautions Precautions: Fall Precaution Comments: PEG, sacral and toe wounds Restrictions Weight Bearing Restrictions: No     Rosita DeChalus 01/03/2021, 7:43 AM   ALars Masson PT, DPT, CBIS 01/05/21 11:52 AM

## 2021-01-03 NOTE — Plan of Care (Signed)
  Problem: RH SKIN INTEGRITY Goal: RH STG SKIN FREE OF INFECTION/BREAKDOWN Description: Skin free of breakdown and infection with min assist to supervision assistance. Outcome: Progressing Goal: RH STG ABLE TO PERFORM INCISION/WOUND CARE W/ASSISTANCE Description: STG Able To Perform Incision/Wound Care With min Assistance to supervision assistance. Outcome: Progressing   Problem: RH KNOWLEDGE DEFICIT GENERAL Goal: RH STG INCREASE KNOWLEDGE OF SELF CARE AFTER HOSPITALIZATION Outcome: Progressing   Problem: Consults Goal: RH GENERAL PATIENT EDUCATION Description: See Patient Education module for education specifics. Outcome: Progressing Goal: Skin Care Protocol Initiated - if Braden Score 18 or less Description: If consults are not indicated, leave blank or document N/A Outcome: Progressing

## 2021-01-03 NOTE — Progress Notes (Signed)
Occupational Therapy Session Note  Patient Details  Name: Jaquell Seddon MRN: [id number] Date of Birth: 1961/09/27  Today's Date: 01/03/2021 OT Individual Time: 6606-3016 OT Individual Time Calculation (min): 45 min   Skilled Therapeutic Interventions/Progress Updates:    Pt greeted in bed, agreeable to earlier than scheduled tx. He requested assistance with having a BM using the BSC. Sara+ utilized for transfer and pt lowered clothing himself prior to stand<sit transition. Mod A for hygiene completion post BM, Sara+ utilized for sit<stand and for transfer to w/c afterwards. Setup-Mod I w/c level for handwashing, face washing, hair combing, shaving using electric razor, and oral care. Pt agreeable to remain sitting up for his next therapy, donned 3 new pillowcases onto pillows. He remained in care of RN for receiving Advil per pt request for pain mgt. Tx focus placed on functional transfers, B LE weightbearing, and functional activity/OOB tolerance.   Therapy Documentation Precautions:  Precautions Precautions: Fall Precaution Comments: PEG, sacral and toe wounds Restrictions Weight Bearing Restrictions: No ADL: ADL Eating: Set up Grooming: Setup Where Assessed-Grooming: Edge of bed Upper Body Bathing: Setup Where Assessed-Upper Body Bathing: Edge of bed Lower Body Bathing: Maximal assistance Where Assessed-Lower Body Bathing: Bed level Upper Body Dressing: Minimal assistance Where Assessed-Upper Body Dressing: Edge of bed Lower Body Dressing: Maximal assistance Where Assessed-Lower Body Dressing: Bed level      Therapy/Group: Individual Therapy  Renny Remer A Arienne Gartin 01/03/2021, 3:03 PM

## 2021-01-03 NOTE — Progress Notes (Signed)
Recreational Therapy Session Note  Patient Details  Name: Jackson Figueroa MRN: [id number] Date of Birth: Aug 10, 1961 Today's Date: 01/03/2021  Pain: no c/o Skilled Therapeutic Interventions/Progress Updates: Session focused on activity tolerance and dynamic sitting balance during co-treat with PT.  Pt performed w/c mobility throughout out the unit with supervision using BUEs.  Pt sat EOC with armrests removed to play Will bowling with supervision and then transitioned to boxing activity seated EOM punching alternately with BUEs with supervision. Therapeutic use of music also included in session.  Therapy/Group: Individual Therapy Kuron Docken 01/03/2021, 12:05 PM

## 2021-01-03 NOTE — Progress Notes (Signed)
Prior trach stoma healed with hypergranulation tag and dried drainage under it.  Area treated briefly with silver nitrate and dry dressing placed. Patient advised that he may see some black tinged drainage on dressing but can remove dressing in am.

## 2021-01-03 NOTE — Progress Notes (Addendum)
[facility] PHYSICAL MEDICINE & REHABILITATION PROGRESS NOTE   Subjective/Complaints:   Taken off specialty bed yesterday since skin better on buttocks-  Also asking to have Seroquel stopped and Metoprolol since BP running low.   LBM this AM- right now.     ROS:   Pt denies SOB, abd pain, CP, N/V/C/D, and vision changes   Objective:   No results found. Recent Labs    01/02/21 0448  WBC 5.9  HGB 8.8*  HCT 26.0*  PLT 229   Recent Labs    01/02/21 0448  NA 139  K 4.0  CL 105  CO2 25  GLUCOSE 78  BUN 26*  CREATININE 1.51*  CALCIUM 8.9    Intake/Output Summary (Last 24 hours) at 01/03/2021 0836 Last data filed at 01/03/2021 6606 Gross per 24 hour  Intake 708 ml  Output 2575 ml  Net -1867 ml     Pressure Injury 11/18/20 Toe (Comment  which one) Anterior;Right Deep Tissue Pressure Injury - Purple or maroon localized area of discolored intact skin or blood-filled blister due to damage of underlying soft tissue from pressure and/or shear. DTI left  (Active)  11/18/20 2330  Location: Toe (Comment  which one) (LEFT big toe)  Location Orientation: Anterior;Right  Staging: Deep Tissue Pressure Injury - Purple or maroon localized area of discolored intact skin or blood-filled blister due to damage of underlying soft tissue from pressure and/or shear.  Wound Description (Comments): DTI left toe  Present on Admission: Yes (on admit to 55M)    Physical Exam: Vital Signs Blood pressure 105/67, pulse 75, temperature 98.2 F (36.8 C), resp. rate 14, height 6' (1.829 m), weight 110 kg, SpO2 94 %. Constitutional: sitting up on BSC with sara plus being used for transfer, OT in room, NAD HENT: Normocephalic.  Atraumatic. Trach side covered C/D/I- looks the same Eyes: EOMI. No discharge. Cardiovascular: RRR_ no JVD Respiratory: CTA B/L- no W/R/R- good air movement GI: Soft, NT, ND, (+)BS   Skin: Warm and dry.   Sacrum- picture as below Psych: appropriate still- joking  some Musc: No edema in extremities.  No tenderness in extremities. Neuro: Ox3 Right ulnar neuropathy- numbness in lateral 4th/5th digit (more in 5th digit numbness) on R with intrinsic atrophy- no change at all today Motor: Right upper extremity: Shoulder abduction 4 --4/5, distally 3/5 Left upper extremity: 4/5 proximal distal Bilateral lower extremities: Hip flexion, knee extension 3 -/5, ankle dorsiflexion 4/5     Assessment/Plan: 1. Functional deficits which require 3+ hours per day of interdisciplinary therapy in a comprehensive inpatient rehab setting.  Physiatrist is providing close team supervision and 24 hour management of active medical problems listed below.  Physiatrist and rehab team continue to assess barriers to discharge/monitor patient progress toward functional and medical goals  Care Tool:  Bathing    Body parts bathed by patient: Right arm,Left arm,Chest,Abdomen,Front perineal area,Right upper leg,Left upper leg,Right lower leg,Left lower leg,Face   Body parts bathed by helper: Buttocks     Bathing assist Assist Level: Minimal Assistance - Patient > 75%     Upper Body Dressing/Undressing Upper body dressing   What is the patient wearing?: Pull over shirt    Upper body assist Assist Level: Supervision/Verbal cueing    Lower Body Dressing/Undressing Lower body dressing      What is the patient wearing?: Pants     Lower body assist Assist for lower body dressing: Moderate Assistance - Patient 50 - 74%     Toileting  Toileting    Toileting assist Assist for toileting: Independent with assistive device Assistive Device Comment: Urinal   Transfers Chair/bed transfer  Transfers assist  Chair/bed transfer activity did not occur: Safety/medical concerns (due to weakness/fatigue)  Chair/bed transfer assist level: Dependent - mechanical lift [name])     Locomotion Ambulation   Ambulation assist   Ambulation activity did not occur:  Safety/medical concerns (due to weakness/fatigue)          Walk 10 feet activity   Assist  Walk 10 feet activity did not occur: Safety/medical concerns (due to weakness/fatigue)        Walk 50 feet activity   Assist Walk 50 feet with 2 turns activity did not occur: Safety/medical concerns (due to weakness/fatigue)         Walk 150 feet activity   Assist Walk 150 feet activity did not occur: Safety/medical concerns (due to weakness/fatigue)         Walk 10 feet on uneven surface  activity   Assist Walk 10 feet on uneven surfaces activity did not occur: Safety/medical concerns (due to weakness/fatigue)         Wheelchair     Assist Will patient use wheelchair at discharge?: Yes Type of Wheelchair: Manual Wheelchair activity did not occur: Safety/medical concerns (due to weakness/fatigue)  Wheelchair assist level: Supervision/Verbal cueing Max wheelchair distance: 125'    Wheelchair 50 feet with 2 turns activity    Assist    Wheelchair 50 feet with 2 turns activity did not occur: Safety/medical concerns (due to weakness/fatigue)   Assist Level: Supervision/Verbal cueing   Wheelchair 150 feet activity     Assist  Wheelchair 150 feet activity did not occur: Safety/medical concerns (due to weakness/fatigue)   Assist Level: Supervision/Verbal cueing   Blood pressure 105/67, pulse 75, temperature 98.2 F (36.8 C), resp. rate 14, height 6' (1.829 m), weight 110 kg, SpO2 94 %.    Medical Problem List and Plan: 1.  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  Antithrombotics: -DVT/anticoagulation:  Pharmaceutical: Lovenox             -antiplatelet therapy: N/A 3. Pain Management: tylenol prn  tramadol 50 mg q6 hours prn  Continue Lyrica  Controlled meds on 2/5  2/7- Lyrica helping nerve pain a lot and  tramadol helping all over body aches- con't regimen  2/8- asked for Ibuprofen 600 mg TID prn- explained could increase bleeding risk - he still wants it- ordered  2/9- changed to 2x/day max but can be given q8 hours- also went over again about kidney issues- will con't for now  2/10- said ibuprofen is helping R wrist pain more than anything, esp when combined with tramadol  2/11- reinforced I'd really like him to take Ibuprofen 1-2x/day- no more- and prefer 1x/day due to elevated Cr- he voiced understanding 4. Mood: LCSW to follow for evaluation and support.              -antipsychotic agents: Seroquel 25 5. Neuropsych: This patient is capable of making decisions on his own behalf. 6. Skin/Wound Care:                          DTI with MASD: areas on buttocks improving.  Zinc for moisture barrier as well as pressure relief measures.  2/10-  will try to get off specialty bed- looking at backside when not in therapy/in w/c.   2/11- off specialty bed- buttocks healed as per pic above   PEG flushes 7. Fluids/Electrolytes/Nutrition: Monitor I/O. Encourage fluid intake. Continue to monitor lytes with serial checks.  8. Morbid obesity: Educate on importance of diet and weight loss to help promote overall health. Encourage side lying when in bed.  9. Pre-renal azotemia vs new CKD Stage IIIa: Improving off tube feeds--encourage fluid intake.              Creatinine 1.48 on 2/3, labs ordered for Monday  2/7- Cr up to 1.57 again- Im wondering if has a new baseline- because is always there- ~ 1.4-1.5 unless gets IVFs-   2/9- will recheck in AM and see how doing on Ibuprofen  2/10- Cr 1.51- stable and BUN 26- stable - likely Cr ~ 1.50 is new baseline  2/11- went over this issue with pt- he voiced understanding 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              Hemoglobin 9.5 on 2/3  overall stable- con't regimen 11. Encephalopathy: Has resolved. Continue Seroquel to help with  sleep hygeine.  12.  Dysphagia: Resolved  2/2- PEG was placed 12/30- so cannot be removed til minimum 3/1  13. Recent trach: Closed  2/9- has some trach site drainage- appears purulent- will start Keflex 500 mg BID (per CrCl) x 3 days- and see how it goes  2/10- no drainage seen this AM, but will con't to monitor- WBC is better at 5.9k  2/11- asked PA to check due to being on toilet this AM  14.  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  Lyrica 50 mg TID_ explained won't help numbness/weakness, only nerve pain.  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 Muscle spasms  2/8- will try Skelaxin 800 mg TID prn for muscle spasms- since c/o severe muscle tightness  2/9- didn't complain of muscle spasms today 17. Previous HTN  2/11- refusing BP meds- will stop 18. Insomnia  2/11- will stop Seroquel per pt request LOS: 16 days A FACE TO FACE EVALUATION WAS PERFORMED  Zayna Toste 01/03/2021, 8:36 AM

## 2021-01-03 NOTE — Progress Notes (Signed)
Physical Therapy Session Note  Patient Details  Name: Jackson Figueroa MRN: [id number] Date of Birth: 23-Apr-1961  Today's Date: 01/03/2021 PT Individual Time: 1005-1100 and 1400-1450 PT Individual Time Calculation (min): 55 min and 50 min  Short Term Goals: Week 2:  PT Short Term Goal 1 (Week 2): Pt will tolerate standing x 5 min with LRAD PT Short Term Goal 1 - Progress (Week 2): Partly met PT Short Term Goal 2 (Week 2): Pt will complete least restrictive transfer with mod A consistently PT Short Term Goal 2 - Progress (Week 2): Met PT Short Term Goal 3 (Week 2): Pt will initiate gait training as safe and able PT Short Term Goal 3 - Progress (Week 2): Partly met  Skilled Therapeutic Interventions/Progress Updates: Tx1: Pt presented at mat resting from OT session. Pt states some soreness in B knees but no intervention needed. Session focused on seated and supine therex to allow recovery to participate in gait training in PM session. Pt participated in following activities:  LAQ with AA to end range and 3 sec hold x 10 Heel slides AROM 2 x 10 SAQ 2 x 10 Bridges (with pt able to clear mat!) 2 x 10 sidelying clamshells 2 x 10 SLR x 5 bilaterally Hamstring pulls with level 3 resistance 2 x 10  Performed SB transfer to w/c with total A for set up and CGA for transfer downhill. Pt transported back to room for time management and performed [name] transfer to bed. Pt performed sit to supine with CGA and increased time and PTA supported B feet as pt boosted self to [facility]. Pt left in bed at end of session with call bell within reach and needs met.   Tx2: Pt presented at EOB agreeable to therapy. Performed [name] transfer to w/c and PTA transported pt to day room for time management and energy conservation. Performed lateral scoot transfer to mat with supervision. Pt set up in Lite Gait and attempted but was unable to come to full stand despite several attempts and mat elevated to 24in therefore Lite Gait used  to assist pt to standing. In standing pt was able to ambulate 19ft before knees buckled!!! Pt returned to w/c and participated in 2 bouts of 2 min Cybex Kinetrong at 50cm/sec. Pt transported back to room and performed [name] transfer to bed. Pt was able to return to supine with CGA and boost to [facility] with PTA blocking B feet. Pt left in bed at end of session with call bell within reach and needs met.      Therapy Documentation Precautions:  Precautions Precautions: Fall Precaution Comments: PEG, sacral and toe wounds Restrictions Weight Bearing Restrictions: No General:   Vital Signs:   Pain: Pain Assessment Pain Scale: 0-10 Pain Score: 0-No pain Pain Type: Neuropathic pain Pain Location: Hand Pain Orientation: Right Pain Descriptors / Indicators: Jabbing Pain Onset: On-going Patients Stated Pain Goal: 1 Pain Intervention(s): Medication (See eMAR) Multiple Pain Sites: No Mobility:   Locomotion :    Trunk/Postural Assessment :    Balance:   Exercises:   Other Treatments:      Therapy/Group: Individual Therapy  Wilgus Deyton 01/03/2021, 12:33 PM

## 2021-01-03 NOTE — Progress Notes (Signed)
Occupational Therapy Session Note  Patient Details  Name: Jackson Figueroa MRN: [id number] Date of Birth: 01-25-1961  Today's Date: 01/03/2021 OT Individual Time: 1115-5208 OT Individual Time Calculation (min): 55 min    Short Term Goals: Week 3:  OT Short Term Goal 1 (Week 3): Pt will be able to tolerate standing in [place] stedy for at least 3 minutes to demonstrate improved endurance. OT Short Term Goal 2 (Week 3): Pt will doff/don pants utilizing lateral leans while seated EOB or on BSC with min A OT Short Term Goal 3 (Week 3): Pt will perform DABSC transfer with CGA OT Short Term Goal 4 (Week 3): Pt will perform toilet hygiene with min A  Skilled Therapeutic Interventions/Progress Updates:    Pt resting in w/c upon arrival. OT intervention with focus on sitting balance and BUE strengthening. Pt engaged in Wii bowling seated in w/c with B/L arm rest removed and pt sitting away from back. Focus on sitting balance and RUE function (shoulder AROM and hand strengthening for control). Pt transitioned to gym and transferred to [facility]. Pt engaged in heavy bag work with Hospital doctor. Transfers and sitting balance with supervision. Pt returned to room and remained in w/c with all needs within reach.   Therapy Documentation Precautions:  Precautions Precautions: Fall Precaution Comments: PEG, sacral and toe wounds Restrictions Weight Bearing Restrictions: No   Pain: Pain Assessment Pain Scale: 0-10 Pain Score: 4  Pain Type: Neuropathic pain Pain Location: Hand Pain Orientation: Right Pain Descriptors / Indicators: Jabbing Pain Onset: On-going Patients Stated Pain Goal: 1 Pain Intervention(s): premedicated   Therapy/Group: Individual Therapy  [name] 01/03/2021, 10:00 AM

## 2021-01-04 DIAGNOSIS — Z930 Tracheostomy status: Secondary | ICD-10-CM

## 2021-01-04 NOTE — Progress Notes (Signed)
Occupational Therapy Session Note  Patient Details  Name: Jackson Figueroa MRN: [id number] Date of Birth: 12-Jan-1961  Today's Date: 01/04/2021 OT Group Time: 1100-1200 OT Group Time Calculation (min): 60 min  Skilled Therapeutic Interventions/Progress Updates:    Pt engaged in therapeutic w/c level dance group focusing on patient choice, UE/LE strengthening, salience, activity tolerance, and social participation. Pt was guided through various dance-based exercises involving UEs/LEs and trunk. All music was selected by group members. Emphasis placed on general strengthening of upper and lower body. Pt was very participative during group, requesting music and simultaneously engaging UEs/LEs during exercises. Provided him with a LE strap to increase therapeutic challenge when mobilizing the LB. At end of session he self propelled his w/c back to the room.    Therapy Documentation Precautions:  Precautions Precautions: Fall Precaution Comments: PEG, sacral and toe wounds Restrictions Weight Bearing Restrictions: No Pain: no s/s pain during tx   ADL: ADL Eating: Set up Grooming: Setup Where Assessed-Grooming: Edge of bed Upper Body Bathing: Setup Where Assessed-Upper Body Bathing: Edge of bed Lower Body Bathing: Maximal assistance Where Assessed-Lower Body Bathing: Bed level Upper Body Dressing: Minimal assistance Where Assessed-Upper Body Dressing: Edge of bed Lower Body Dressing: Maximal assistance Where Assessed-Lower Body Dressing: Bed level     Therapy/Group: Group Therapy  Amayra Kiedrowski A Burch Marchuk 01/04/2021, 12:41 PM

## 2021-01-04 NOTE — Progress Notes (Signed)
[facility] PHYSICAL MEDICINE & REHABILITATION PROGRESS NOTE   Subjective/Complaints:   Pt up in bed. No new issues. Wanted me to look at his stoma which was treated with silver nitrate yesterday  ROS: Patient denies fever, rash, sore throat, blurred vision, nausea, vomiting, diarrhea, cough, shortness of breath or chest pain,   headache, or mood change.     Objective:   No results found. Recent Labs    01/02/21 0448  WBC 5.9  HGB 8.8*  HCT 26.0*  PLT 229   Recent Labs    01/02/21 0448  NA 139  K 4.0  CL 105  CO2 25  GLUCOSE 78  BUN 26*  CREATININE 1.51*  CALCIUM 8.9    Intake/Output Summary (Last 24 hours) at 01/04/2021 1125 Last data filed at 01/04/2021 0900 Gross per 24 hour  Intake 100 ml  Output 2350 ml  Net -2250 ml     Pressure Injury 11/18/20 Toe (Comment  which one) Anterior;Right Deep Tissue Pressure Injury - Purple or maroon localized area of discolored intact skin or blood-filled blister due to damage of underlying soft tissue from pressure and/or shear. DTI left  (Active)  11/18/20 2330  Location: Toe (Comment  which one) (LEFT big toe)  Location Orientation: Anterior;Right  Staging: Deep Tissue Pressure Injury - Purple or maroon localized area of discolored intact skin or blood-filled blister due to damage of underlying soft tissue from pressure and/or shear.  Wound Description (Comments): DTI left toe  Present on Admission: Yes (on admit to 75M)    Physical Exam: Vital Signs Blood pressure 123/76, pulse 86, temperature 97.8 F (36.6 C), temperature source Oral, resp. rate 18, height 6' (1.829 m), weight 110 kg, SpO2 100 %. Constitutional: No distress . Vital signs reviewed. HEENT: EOMI, oral membranes moist Neck: supple Cardiovascular: RRR without murmur. No JVD    Respiratory/Chest: CTA Bilaterally without wheezes or rales. Normal effort    GI/Abdomen: BS +, non-tender, non-distended Ext: no clubbing, cyanosis, or edema Psych: pleasant  and cooperative   Skin: Warm and dry.   Sacrum- pictured below--stable Musc: No edema in extremities.  No tenderness in extremities. Neuro: Ox3 Right ulnar neuropathy- numbness in lateral 4th/5th digit (more in 5th digit numbness) on R with intrinsic atrophy- no change at all today Motor: Right upper extremity: Shoulder abduction 4 --4/5, distally 3/5 Left upper extremity: 4/5 proximal distal Bilateral lower extremities: Hip flexion, knee extension 3 -/5, ankle dorsiflexion 4/5     Assessment/Plan: 1. Functional deficits which require 3+ hours per day of interdisciplinary therapy in a comprehensive inpatient rehab setting.  Physiatrist is providing close team supervision and 24 hour management of active medical problems listed below.  Physiatrist and rehab team continue to assess barriers to discharge/monitor patient progress toward functional and medical goals  Care Tool:  Bathing    Body parts bathed by patient: Right arm,Left arm,Chest,Abdomen,Front perineal area,Right upper leg,Left upper leg,Right lower leg,Left lower leg,Face   Body parts bathed by helper: Buttocks     Bathing assist Assist Level: Minimal Assistance - Patient > 75%     Upper Body Dressing/Undressing Upper body dressing   What is the patient wearing?: Pull over shirt    Upper body assist Assist Level: Supervision/Verbal cueing    Lower Body Dressing/Undressing Lower body dressing      What is the patient wearing?: Pants     Lower body assist Assist for lower body dressing: Moderate Assistance - Patient 50 - 74%  Toileting Toileting    Toileting assist Assist for toileting: Dependent - Patient 0% (using Sara+) Assistive Device Comment: Urinal   Transfers Chair/bed transfer  Transfers assist  Chair/bed transfer activity did not occur: Safety/medical concerns (due to weakness/fatigue)  Chair/bed transfer assist level: Dependent - mechanical lift [name])      Locomotion Ambulation   Ambulation assist   Ambulation activity did not occur: Safety/medical concerns (due to weakness/fatigue)          Walk 10 feet activity   Assist  Walk 10 feet activity did not occur: Safety/medical concerns (due to weakness/fatigue)        Walk 50 feet activity   Assist Walk 50 feet with 2 turns activity did not occur: Safety/medical concerns (due to weakness/fatigue)         Walk 150 feet activity   Assist Walk 150 feet activity did not occur: Safety/medical concerns (due to weakness/fatigue)         Walk 10 feet on uneven surface  activity   Assist Walk 10 feet on uneven surfaces activity did not occur: Safety/medical concerns (due to weakness/fatigue)         Wheelchair     Assist Will patient use wheelchair at discharge?: Yes Type of Wheelchair: Manual Wheelchair activity did not occur: Safety/medical concerns (due to weakness/fatigue)  Wheelchair assist level: Supervision/Verbal cueing Max wheelchair distance: 125'    Wheelchair 50 feet with 2 turns activity    Assist    Wheelchair 50 feet with 2 turns activity did not occur: Safety/medical concerns (due to weakness/fatigue)   Assist Level: Supervision/Verbal cueing   Wheelchair 150 feet activity     Assist  Wheelchair 150 feet activity did not occur: Safety/medical concerns (due to weakness/fatigue)   Assist Level: Supervision/Verbal cueing   Blood pressure 123/76, pulse 86, temperature 97.8 F (36.6 C), temperature source Oral, resp. rate 18, height 6' (1.829 m), weight 110 kg, SpO2 100 %.    Medical Problem List and Plan: 1.  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  Antithrombotics: -DVT/anticoagulation:  Pharmaceutical: Lovenox             -antiplatelet therapy: N/A 3. Pain Management: tylenol  prn  tramadol 50 mg q6 hours prn  Continue Lyrica  Controlled meds on 2/5  2/7- Lyrica helping nerve pain a lot and tramadol helping all over body aches- con't regimen  2/8- asked for Ibuprofen 600 mg TID prn- explained could increase bleeding risk - he still wants it- ordered  2/9- changed to 2x/day max but can be given q8 hours- also went over again about kidney issues- will con't for now  2/10- said ibuprofen is helping R wrist pain more than anything, esp when combined with tramadol  2/11- reinforced I'd really like him to take Ibuprofen 1-2x/day- no more- and prefer 1x/day due to elevated Cr- he voiced understanding 4. Mood: LCSW to follow for evaluation and support.              -antipsychotic agents: Seroquel 25 5. Neuropsych: This patient is capable of making decisions on his own behalf. 6. Skin/Wound Care:                          DTI with MASD: areas on buttocks improving.  Zinc for moisture barrier as well  as pressure relief measures.  2/10- will try to get off specialty bed- looking at backside when not in therapy/in w/c.   2/11-12- off specialty bed- buttocks healed as per pic above         7. Fluids/Electrolytes/Nutrition: Monitor I/O. Encourage fluid intake. Continue to monitor lytes with serial checks.  8. Morbid obesity: Educate on importance of diet and weight loss to help promote overall health. Encourage side lying when in bed.  9. Pre-renal azotemia vs new CKD Stage IIIa: Improving off tube feeds--encourage fluid intake.              Creatinine 1.48 on 2/3, labs ordered for Monday  2/7- Cr up to 1.57 again- Im wondering if has a new baseline- because is always there- ~ 1.4-1.5 unless gets IVFs-   2/9- will recheck in AM and see how doing on Ibuprofen  2/10- Cr 1.51- stable and BUN 26- stable - likely Cr ~ 1.50 is new baseline  2/11- went over this issue with pt- he voiced understanding 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               Hemoglobin 9.5 on 2/3  overall stable- con't regimen 11. Encephalopathy: Has resolved. Continue Seroquel to help with sleep hygeine.  12.  Dysphagia: Resolved  2/2- PEG was placed 12/30- so cannot be removed til minimum 3/1  13. Recent trach: Closed  2/9- has some trach site drainage- appears purulent- will start Keflex 500 mg BID (per CrCl) x 3 days- and see how it goes  2/10- no drainage seen this AM, but will con't to monitor- WBC is better at 5.9k  2/12- stoma closed, hypergranulation improved after ag nitrate   -clean up area. Can just use bandaid  14.  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  Lyrica 50 mg TID_ explained won't help numbness/weakness, only nerve pain.  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 Muscle spasms  2/8- will try Skelaxin 800 mg TID prn for muscle spasms- since c/o severe muscle tightness  2/9- didn't complain of muscle spasms today 17. Previous HTN  2/11- refusing BP meds- will stop 18. Insomnia  2/11- stopped Seroquel per pt request LOS: 17 days A FACE TO [place] 01/04/2021, 11:25 AM

## 2021-01-04 NOTE — Plan of Care (Signed)
  Problem: RH SKIN INTEGRITY Goal: RH STG SKIN FREE OF INFECTION/BREAKDOWN Description: Skin free of breakdown and infection with min assist to supervision assistance. Outcome: Progressing Goal: RH STG ABLE TO PERFORM INCISION/WOUND CARE W/ASSISTANCE Description: STG Able To Perform Incision/Wound Care With min Assistance to supervision assistance. Outcome: Progressing   Problem: RH KNOWLEDGE DEFICIT GENERAL Goal: RH STG INCREASE KNOWLEDGE OF SELF CARE AFTER HOSPITALIZATION Outcome: Progressing   Problem: Consults Goal: RH GENERAL PATIENT EDUCATION Description: See Patient Education module for education specifics. Outcome: Progressing Goal: Skin Care Protocol Initiated - if Braden Score 18 or less Description: If consults are not indicated, leave blank or document N/A Outcome: Progressing

## 2021-01-05 MED ORDER — PREGABALIN 75 MG PO CAPS
75.0000 mg | ORAL_CAPSULE | Freq: Three times a day (TID) | ORAL | Status: DC
  Administered 2021-01-05 – 2021-01-24 (×57): 75 mg via ORAL
  Filled 2021-01-05 (×57): qty 1

## 2021-01-05 NOTE — Progress Notes (Signed)
Physical Therapy Session Note  Patient Details  Name: Jackson Figueroa MRN: [id number] Date of Birth: Nov 22, 1961  Today's Date: 01/05/2021 PT Individual Time: 0730-0830 PT Individual Time Calculation (min): 60 min   Short Term Goals: Week 3:  PT Short Term Goal 1 (Week 3): Pt will be able to perform sit <> stands with max assist with LRAD PT Short Term Goal 2 (Week 3): Pt will be able to gait with lift equipment x 20' PT Short Term Goal 3 (Week 3): Pt will be able to perform bed <> w/c transfers without use of lift equipment with min/mod assist  Skilled Therapeutic Interventions/Progress Updates:    Pt performed bed mobility with use of rail with supervision to come to EOB. Changed shirt with set-up assistance and supervision for functional dynamic sitting balance. Pt preference to use [name] plus for transfer to w/c initially due to early morning and generalized pain today. Donned sling and used lift for transfer to w/c with cues for pt to squeeze glutes and activate through LE's during standing portion of transfer. Scoot pivot transfer with min assist to mat from w/c but cues to increase ability to perform true squat pivot to clear bottom to decrease risk of skin breakdown. Utilized Stedy and elevated mat table to perform 4 reps of sit <> stands from perched surface with mod to max assist and standing tolerance up to about 1 min each of the trials. In standing, pt able to engage in pre-gait activities of weightshifting and marching in place with min assist for balance and reliance on UE support on Stedy. Time up in standing limited due to fatigue. Scoot pivot transfer back to w/c with min assist and cues for increasing bottom clearance and technique. Pt reports need to have BM. Used [name] plus to transfer w/c > BSC > bed and in standing for total assist for hygiene. Discussed and educated on overall goals and progress as well as post-covid diagnosis and impact of long hospitalization. Left with all needs in  reach at end of session.   Therapy Documentation Precautions:  Precautions Precautions: Fall Precaution Comments: PEG, sacral and toe wounds Restrictions Weight Bearing Restrictions: No   Pain: Reports pain all over but reports already having voltaren gel applied this morning.  Therapy/Group: Individual Therapy  Canary Brim Ivory Broad, PT, DPT, CBIS  01/05/2021, 11:45 AM

## 2021-01-05 NOTE — Progress Notes (Signed)
Occupational Therapy Session Note  Patient Details  Name: Shiraz Bastyr MRN: [id number] Date of Birth: 1961-02-27  Today's Date: 01/05/2021 OT Individual Time: 1450-1532 OT Individual Time Calculation (min): 42 min    Short Term Goals: Week 3:  OT Short Term Goal 1 (Week 3): Pt will be able to tolerate standing in [place] stedy for at least 3 minutes to demonstrate improved endurance. OT Short Term Goal 2 (Week 3): Pt will doff/don pants utilizing lateral leans while seated EOB or on BSC with min A OT Short Term Goal 3 (Week 3): Pt will perform DABSC transfer with CGA OT Short Term Goal 4 (Week 3): Pt will perform toilet hygiene with min A  Skilled Therapeutic Interventions/Progress Updates:    Pt greeted EOB with no c/o pain. He wanted to try using the [facility] for toileting today. He donned his shoes with setup assist and then completed sit<stand in [place] with Mod A from elevated bed. Pt able to complete clothing mgt in standing with Min balance assist before sitting to void bowels. We discussed adaptive ways to complete hygiene, lowered the toilet bar of his drop arm BSC so that he could practice leaning to perform hygiene himself. Pt reported he felt too "confined" in the [facility] space and that he was unable to lean sufficiently enough to try himself. Therefore hygiene was completed while pt stood in [place], [place] A for sit<stand from lower BSC. Mod A to elevate clothing before returning to bed. Next taught him supine core exercises to work on trunk control needed to utilize lateral lean technique in the context of toileting. Left him in bed at close of session with all needs within reach.   Therapy Documentation Precautions:  Precautions Precautions: Fall Precaution Comments: PEG, sacral and toe wounds Restrictions Weight Bearing Restrictions: No Vital Signs: Therapy Vitals Temp: 99 F (37.2 C) Pulse Rate: 89 Resp: 18 BP: 109/84 Patient Position (if appropriate): Lying Oxygen  Therapy SpO2: 98 % O2 Device: Room Air Pain: Pain Assessment Pain Scale: 0-10 Pain Score: 2  Pain Type: Neuropathic pain Pain Location: Hand Pain Orientation: Right Pain Descriptors / Indicators: Aching;Grimacing Pain Frequency: Intermittent Pain Onset: On-going Patients Stated Pain Goal: 2 Pain Intervention(s): Medication (See eMAR) Multiple Pain Sites: No ADL: ADL Eating: Set up Grooming: Setup Where Assessed-Grooming: Edge of bed Upper Body Bathing: Setup Where Assessed-Upper Body Bathing: Edge of bed Lower Body Bathing: Maximal assistance Where Assessed-Lower Body Bathing: Bed level Upper Body Dressing: Minimal assistance Where Assessed-Upper Body Dressing: Edge of bed Lower Body Dressing: Maximal assistance Where Assessed-Lower Body Dressing: Bed level      Therapy/Group: Individual Therapy  Markisha Meding A Yaxiel Minnie 01/05/2021, 4:14 PM

## 2021-01-05 NOTE — Plan of Care (Signed)
  Problem: RH SKIN INTEGRITY Goal: RH STG SKIN FREE OF INFECTION/BREAKDOWN Description: Skin free of breakdown and infection with min assist to supervision assistance. Outcome: Progressing Goal: RH STG ABLE TO PERFORM INCISION/WOUND CARE W/ASSISTANCE Description: STG Able To Perform Incision/Wound Care With min Assistance to supervision assistance. Outcome: Progressing   Problem: RH KNOWLEDGE DEFICIT GENERAL Goal: RH STG INCREASE KNOWLEDGE OF SELF CARE AFTER HOSPITALIZATION Outcome: Progressing   Problem: Consults Goal: RH GENERAL PATIENT EDUCATION Description: See Patient Education module for education specifics. Outcome: Progressing Goal: Skin Care Protocol Initiated - if Braden Score 18 or less Description: If consults are not indicated, leave blank or document N/A Outcome: Progressing

## 2021-01-05 NOTE — Progress Notes (Signed)
Patient is requesting Ensure Supplement, no acute distress  0500 Patient appears to be resting at interval throughout shift with no acute distress or c/o,remain on Contact Isolation- ESBL

## 2021-01-05 NOTE — Progress Notes (Signed)
[place] PHYSICAL MEDICINE & REHABILITATION PROGRESS NOTE   Subjective/Complaints: "my ulnar nerve is really bothering me."--referring to RUE dysesthesias. Sx were worse last night  ROS: Patient denies fever, rash, sore throat, blurred vision, nausea, vomiting, diarrhea, cough, shortness of breath or chest pain,   headache, or mood change.     Objective:   No results found. No results for input(s): WBC, HGB, HCT, PLT in the last 72 hours. No results for input(s): NA, K, CL, CO2, GLUCOSE, BUN, CREATININE, CALCIUM in the last 72 hours.  Intake/Output Summary (Last 24 hours) at 01/05/2021 0947 Last data filed at 01/05/2021 0645 Gross per 24 hour  Intake 820 ml  Output 3425 ml  Net -2605 ml     Pressure Injury 11/18/20 Toe (Comment  which one) Anterior;Right Deep Tissue Pressure Injury - Purple or maroon localized area of discolored intact skin or blood-filled blister due to damage of underlying soft tissue from pressure and/or shear. DTI left  (Active)  11/18/20 2330  Location: Toe (Comment  which one) (LEFT big toe)  Location Orientation: Anterior;Right  Staging: Deep Tissue Pressure Injury - Purple or maroon localized area of discolored intact skin or blood-filled blister due to damage of underlying soft tissue from pressure and/or shear.  Wound Description (Comments): DTI left toe  Present on Admission: Yes (on admit to 56M)    Physical Exam: Vital Signs Blood pressure 131/83, pulse 83, temperature (!) 97.5 F (36.4 C), temperature source Oral, resp. rate 18, height 6' (1.829 m), weight 110 kg, SpO2 99 %. Constitutional: No distress . Vital signs reviewed. HEENT: EOMI, oral membranes moist Neck: supple Cardiovascular: RRR without murmur. No JVD    Respiratory/Chest: CTA Bilaterally without wheezes or rales. Normal effort    GI/Abdomen: BS +, non-tender, non-distended Ext: no clubbing, cyanosis, or edema Psych: pleasant and cooperative  Skin: Warm and dry.   Sacrum-  pictured below--stable Musc: No edema in extremities.  No tenderness in extremities. Neuro: Ox3 Right ulnar neuropathy- numbness in lateral 4th/5th digit (more in 5th digit numbness) on R with intrinsic atrophy- exam is consistent 2/13 Motor: Right upper extremity: Shoulder abduction 4 --4/5, distally 3/5 Left upper extremity: 4/5 proximal distal Bilateral lower extremities: Hip flexion, knee extension 3 -/5, ankle dorsiflexion 4/5     Assessment/Plan: 1. Functional deficits which require 3+ hours per day of interdisciplinary therapy in a comprehensive inpatient rehab setting.  Physiatrist is providing close team supervision and 24 hour management of active medical problems listed below.  Physiatrist and rehab team continue to assess barriers to discharge/monitor patient progress toward functional and medical goals  Care Tool:  Bathing    Body parts bathed by patient: Right arm,Left arm,Chest,Abdomen,Front perineal area,Right upper leg,Left upper leg,Right lower leg,Left lower leg,Face   Body parts bathed by helper: Buttocks     Bathing assist Assist Level: Minimal Assistance - Patient > 75%     Upper Body Dressing/Undressing Upper body dressing   What is the patient wearing?: Pull over shirt    Upper body assist Assist Level: Supervision/Verbal cueing    Lower Body Dressing/Undressing Lower body dressing      What is the patient wearing?: Pants     Lower body assist Assist for lower body dressing: Moderate Assistance - Patient 50 - 74%     Toileting Toileting    Toileting assist Assist for toileting: Dependent - Patient 0% (using Sara+) Assistive Device Comment: Urinal   Transfers Chair/bed transfer  Transfers assist  Chair/bed transfer activity did  not occur: Safety/medical concerns (due to weakness/fatigue)  Chair/bed transfer assist level: Dependent - mechanical lift [name])     Locomotion Ambulation   Ambulation assist   Ambulation activity did  not occur: Safety/medical concerns (due to weakness/fatigue)          Walk 10 feet activity   Assist  Walk 10 feet activity did not occur: Safety/medical concerns (due to weakness/fatigue)        Walk 50 feet activity   Assist Walk 50 feet with 2 turns activity did not occur: Safety/medical concerns (due to weakness/fatigue)         Walk 150 feet activity   Assist Walk 150 feet activity did not occur: Safety/medical concerns (due to weakness/fatigue)         Walk 10 feet on uneven surface  activity   Assist Walk 10 feet on uneven surfaces activity did not occur: Safety/medical concerns (due to weakness/fatigue)         Wheelchair     Assist Will patient use wheelchair at discharge?: Yes Type of Wheelchair: Manual Wheelchair activity did not occur: Safety/medical concerns (due to weakness/fatigue)  Wheelchair assist level: Supervision/Verbal cueing Max wheelchair distance: 125'    Wheelchair 50 feet with 2 turns activity    Assist    Wheelchair 50 feet with 2 turns activity did not occur: Safety/medical concerns (due to weakness/fatigue)   Assist Level: Supervision/Verbal cueing   Wheelchair 150 feet activity     Assist  Wheelchair 150 feet activity did not occur: Safety/medical concerns (due to weakness/fatigue)   Assist Level: Supervision/Verbal cueing   Blood pressure 131/83, pulse 83, temperature (!) 97.5 F (36.4 C), temperature source Oral, resp. rate 18, height 6' (1.829 m), weight 110 kg, SpO2 99 %.    Medical Problem List and Plan: 1.  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  Antithrombotics: -DVT/anticoagulation:  Pharmaceutical: Lovenox             -antiplatelet therapy: N/A 3. Pain Management: tylenol prn  tramadol 50 mg q6 hours prn  Continue Lyrica  Controlled meds on  2/5  2/7- Lyrica helping nerve pain a lot and tramadol helping all over body aches- con't regimen  2/8- asked for Ibuprofen 600 mg TID prn- explained could increase bleeding risk - he still wants it- ordered  2/9- changed to 2x/day max but can be given q8 hours- also went over again about kidney issues- will con't for now  2/10- said ibuprofen is helping R wrist pain more than anything, esp when combined with tramadol  2/11- reinforced I'd really like him to take Ibuprofen 1-2x/day- no more- and prefer 1x/day due to elevated Cr- he voiced understanding  2/13 increased RUE ulnar pain   -GFR 54--> increase lyrica to 75mg  tid 4. Mood: LCSW to follow for evaluation and support.              -antipsychotic agents: Seroquel 25 5. Neuropsych: This patient is capable of making decisions on his own behalf. 6. Skin/Wound Care:                          DTI with MASD: areas on buttocks improving.  Zinc for moisture barrier as well as pressure relief measures.  2/10- will try to get off specialty bed- looking at  backside when not in therapy/in w/c.   2/11-12- off specialty bed- buttocks healed as per pic above         7. Fluids/Electrolytes/Nutrition: Monitor I/O. Encourage fluid intake. Continue to monitor lytes with serial checks.  8. Morbid obesity: Educate on importance of diet and weight loss to help promote overall health. Encourage side lying when in bed.  9. Pre-renal azotemia vs new CKD Stage IIIa: Improving off tube feeds--encourage fluid intake.              Creatinine 1.48 on 2/3, labs ordered for Monday  2/7- Cr up to 1.57 again- Im wondering if has a new baseline- because is always there- ~ 1.4-1.5 unless gets IVFs-   2/9- will recheck in AM and see how doing on Ibuprofen  2/10- Cr 1.51- stable and BUN 26- stable - likely Cr ~ 1.50 is new baseline  2/11- went over this issue with pt- he voiced understanding 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               Hemoglobin 9.5 on 2/3  overall stable- con't regimen 11. Encephalopathy: Has resolved. Continue Seroquel to help with sleep hygeine.  12.  Dysphagia: Resolved  2/2- PEG was placed 12/30- so cannot be removed til minimum 3/1  13. Recent trach: Closed  2/9- has some trach site drainage- appears purulent- will start Keflex 500 mg BID (per CrCl) x 3 days- and see how it goes  2/10- no drainage seen this AM, but will con't to monitor- WBC is better at 5.9k  2/13- stoma closed after silver nitrate, can use bandaid or no dressing  14.  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  Lyrica 50 mg TID_ explained won't help numbness/weakness, only nerve pain.  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 Muscle spasms  2/8- will try Skelaxin 800 mg TID prn for muscle spasms- since c/o severe muscle tightness  2/9- didn't complain of muscle spasms today 17. Previous HTN  2/11- refusing BP meds- will stop 18. Insomnia  2/11- stopped Seroquel per pt request LOS: 18 days A FACE TO [place]  Meredith Staggers 01/05/2021, 9:47 AM

## 2021-01-06 LAB — CBC WITH DIFFERENTIAL/PLATELET
Abs Immature Granulocytes: 0.04 10*3/uL (ref 0.00–0.07)
Basophils Absolute: 0 10*3/uL (ref 0.0–0.1)
Basophils Relative: 1 %
Eosinophils Absolute: 0.3 10*3/uL (ref 0.0–0.5)
Eosinophils Relative: 5 %
HCT: 27.1 % — ABNORMAL LOW (ref 39.0–52.0)
Hemoglobin: 8.7 g/dL — ABNORMAL LOW (ref 13.0–17.0)
Immature Granulocytes: 1 %
Lymphocytes Relative: 26 %
Lymphs Abs: 1.5 10*3/uL (ref 0.7–4.0)
MCH: 32 pg (ref 26.0–34.0)
MCHC: 32.1 g/dL (ref 30.0–36.0)
MCV: 99.6 fL (ref 80.0–100.0)
Monocytes Absolute: 0.6 10*3/uL (ref 0.1–1.0)
Monocytes Relative: 10 %
Neutro Abs: 3.3 10*3/uL (ref 1.7–7.7)
Neutrophils Relative %: 57 %
Platelets: 217 10*3/uL (ref 150–400)
RBC: 2.72 MIL/uL — ABNORMAL LOW (ref 4.22–5.81)
RDW: 13.7 % (ref 11.5–15.5)
WBC: 5.7 10*3/uL (ref 4.0–10.5)
nRBC: 0 % (ref 0.0–0.2)

## 2021-01-06 LAB — BASIC METABOLIC PANEL
Anion gap: 8 (ref 5–15)
BUN: 23 mg/dL — ABNORMAL HIGH (ref 6–20)
CO2: 26 mmol/L (ref 22–32)
Calcium: 9.2 mg/dL (ref 8.9–10.3)
Chloride: 106 mmol/L (ref 98–111)
Creatinine, Ser: 1.65 mg/dL — ABNORMAL HIGH (ref 0.61–1.24)
GFR, Estimated: 48 mL/min — ABNORMAL LOW (ref >60)
Glucose, Bld: 86 mg/dL (ref 70–99)
Potassium: 4.6 mmol/L (ref 3.5–5.1)
Sodium: 140 mmol/L (ref 135–145)

## 2021-01-06 MED ORDER — DULOXETINE HCL 30 MG PO CPEP
30.0000 mg | ORAL_CAPSULE | Freq: Every day | ORAL | Status: DC
  Administered 2021-01-06 – 2021-01-10 (×5): 30 mg via ORAL
  Filled 2021-01-06 (×5): qty 1

## 2021-01-06 NOTE — Progress Notes (Signed)
[place] PHYSICAL MEDICINE & REHABILITATION PROGRESS NOTE   Subjective/Complaints: \ Having bad nerve pain that isn't controlled with Lyrica- wants me to increase, which I can't due to Kidney function- up to Cr 1.65 from ~ 1.50.   Wants to get rid of specialty bed -it's not long enough and doesn't need it.   ROS:  Pt denies SOB, abd pain, CP, N/V/C/D, and vision changes   Objective:   No results found. Recent Labs    01/06/21 0510  WBC 5.7  HGB 8.7*  HCT 27.1*  PLT 217   Recent Labs    01/06/21 0510  NA 140  K 4.6  CL 106  CO2 26  GLUCOSE 86  BUN 23*  CREATININE 1.65*  CALCIUM 9.2    Intake/Output Summary (Last 24 hours) at 01/06/2021 1938 Last data filed at 01/06/2021 1858 Gross per 24 hour  Intake 1063 ml  Output 1950 ml  Net -887 ml     Pressure Injury 11/18/20 Toe (Comment  which one) Anterior;Right Deep Tissue Pressure Injury - Purple or maroon localized area of discolored intact skin or blood-filled blister due to damage of underlying soft tissue from pressure and/or shear. DTI left  (Active)  11/18/20 2330  Location: Toe (Comment  which one) (LEFT big toe)  Location Orientation: Anterior;Right  Staging: Deep Tissue Pressure Injury - Purple or maroon localized area of discolored intact skin or blood-filled blister due to damage of underlying soft tissue from pressure and/or shear.  Wound Description (Comments): DTI left toe  Present on Admission: Yes (on admit to 79M)    Physical Exam: Vital Signs Blood pressure 106/73, pulse 85, temperature 97.6 F (36.4 C), resp. rate 18, height 6' (1.829 m), weight 110 kg, SpO2 98 %. Constitutional: No distress . Vital signs reviewed. Sitting EOB eating breakfast, appropriate, but doesn't realize kidney function as issue, NAD HEENT: EOMI, oral membranes moist Neck: supple Cardiovascular: RRR Respiratory/Chest: CTA B/L- no W/R/R- good air movement    GI/Abdomen: Soft, NT, ND, (+)BS  Ext: no clubbing, cyanosis,  or edema Psych: appropriate Skin: Warm and dry.   Sacrum- pictured below--stable Musc: No edema in extremities.  No tenderness in extremities. 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 Functional deficits which require 3+ hours per day of interdisciplinary therapy in a comprehensive inpatient rehab setting.  Physiatrist is providing close team supervision and 24 hour management of active medical problems listed below.  Physiatrist and rehab team continue to assess barriers to discharge/monitor patient progress toward functional and medical goals  Care Tool:  Bathing    Body parts bathed by patient: Right arm,Left arm,Chest,Abdomen,Front perineal area,Right upper leg,Left upper leg,Right lower leg,Left lower leg,Face   Body parts bathed by helper: Buttocks     Bathing assist Assist Level: Minimal Assistance - Patient > 75%     Upper Body Dressing/Undressing Upper body dressing   What is the patient wearing?: Pull over shirt    Upper body assist Assist Level: Supervision/Verbal cueing    Lower Body Dressing/Undressing Lower body dressing      What is the patient wearing?: Pants     Lower body assist Assist for lower body dressing: Moderate Assistance -  Patient 50 - 74%     Toileting Toileting    Toileting assist Assist for toileting: Dependent - Patient 0% (using bariatric Stedy) Assistive Device Comment: Urinal   Transfers Chair/bed transfer  Transfers assist  Chair/bed transfer activity did not occur: Safety/medical concerns (due to weakness/fatigue)  Chair/bed transfer assist level: Dependent - mechanical lift      Locomotion Ambulation   Ambulation assist   Ambulation activity did not occur: Safety/medical concerns (due to weakness/fatigue)          Walk 10 feet activity   Assist  Walk 10 feet activity did not occur: Safety/medical concerns (due to weakness/fatigue)        Walk 50 feet activity   Assist Walk 50 feet with 2 turns activity did not occur: Safety/medical concerns (due to weakness/fatigue)         Walk 150 feet activity   Assist Walk 150 feet activity did not occur: Safety/medical concerns (due to weakness/fatigue)         Walk 10 feet on uneven surface  activity   Assist Walk 10 feet on uneven surfaces activity did not occur: Safety/medical concerns (due to weakness/fatigue)         Wheelchair     Assist Will patient use wheelchair at discharge?: Yes Type of Wheelchair: Manual Wheelchair activity did not occur: Safety/medical concerns (due to weakness/fatigue)  Wheelchair assist level: Supervision/Verbal cueing Max wheelchair distance: 100'    Wheelchair 50 feet with 2 turns activity    Assist    Wheelchair 50 feet with 2 turns activity did not occur: Safety/medical concerns (due to weakness/fatigue)   Assist Level: Supervision/Verbal cueing   Wheelchair 150 feet activity     Assist  Wheelchair 150 feet activity did not occur: Safety/medical concerns (due to weakness/fatigue)   Assist Level: Supervision/Verbal cueing   Blood pressure 106/73, pulse 85, temperature 97.6 F (36.4 C), resp. rate 18, height 6' (1.829 m), weight 110 kg, SpO2 98 %.    Medical Problem List and Plan: 1.  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  Antithrombotics: -DVT/anticoagulation:  Pharmaceutical: Lovenox             -antiplatelet therapy: N/A 3. Pain Management: tylenol prn  tramadol 50 mg q6 hours  prn  Continue Lyrica  Controlled meds on 2/5  2/7- Lyrica helping nerve pain a lot and tramadol helping all over body aches- con't regimen  2/8- asked for Ibuprofen 600 mg TID prn- explained could increase bleeding risk - he still wants it- ordered  2/9- changed to 2x/day max but can be given q8 hours- also went over again about kidney issues- will con't for now  2/10- said ibuprofen is helping R wrist pain more than anything, esp when combined with tramadol  2/11- reinforced I'd really like him to take Ibuprofen 1-2x/day- no more- and prefer 1x/day due to elevated Cr- he voiced understanding  2/13 increased RUE ulnar pain   -GFR 54--> increase lyrica to 75mg  tid  2/14- increased lyrica to 75 mg TID yesterday- but with Cr up to 1.65 cannot increase more- can try Duloxetine 30 mg nightly for nerve pain 4. Mood: LCSW to follow for evaluation and support.              -antipsychotic agents: Seroquel 25 5. Neuropsych: This patient is capable  of making decisions on his own behalf. 6. Skin/Wound Care:                          DTI with MASD: areas on buttocks improving.  Zinc for moisture barrier as well as pressure relief measures.  2/10- will try to get off specialty bed- looking at backside when not in therapy/in w/c.   2/11-12- off specialty bed- buttocks healed as per pic above    2/14- still on low air loss mattress- will rmeove     7. Fluids/Electrolytes/Nutrition: Monitor I/O. Encourage fluid intake. Continue to monitor lytes with serial checks.  8. Morbid obesity: Educate on importance of diet and weight loss to help promote overall health. Encourage side lying when in bed.  9. Pre-renal azotemia vs new CKD Stage IIIa: Improving off tube feeds--encourage fluid intake.              Creatinine 1.48 on 2/3, labs ordered for Monday  2/7- Cr up to 1.57 again- Im wondering if has a new baseline- because is always there- ~ 1.4-1.5 unless gets IVFs-   2/9- will recheck in AM and see how doing on  Ibuprofen  2/10- Cr 1.51- stable and BUN 26- stable - likely Cr ~ 1.50 is new baseline  2/11- went over this issue with pt- he voiced understanding  2/14- Cr up to 1.65- if doesn't drink more, will have to do IVFs/use PEG again.  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              Hemoglobin 9.5 on 2/3  overall stable- con't regimen 11. Encephalopathy: Has resolved. Continue Seroquel to help with sleep hygeine.  12.  Dysphagia: Resolved  2/2- PEG was placed 12/30- so cannot be removed til minimum 3/1  13. Recent trach: Closed  2/9- has some trach site drainage- appears purulent- will start Keflex 500 mg BID (per CrCl) x 3 days- and see how it goes  2/10- no drainage seen this AM, but will con't to monitor- WBC is better at 5.9k  2/13- stoma closed after silver nitrate, can use bandaid or no dressing  2/14- closed/healed  14.  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  Lyrica 50 mg TID_ explained won't help numbness/weakness, only nerve pain.  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 Muscle spasms  2/8- will try Skelaxin 800 mg TID prn for muscle spasms- since c/o severe muscle tightness  2/9- didn't complain of muscle spasms today 17. Previous HTN  2/11- refusing BP meds- will stop 18. Insomnia  2/11- stopped Seroquel per pt request LOS: 19 days A FACE TO FACE EVALUATION WAS PERFORMED  Trenae Brunke 01/06/2021, 7:38 PM

## 2021-01-06 NOTE — Progress Notes (Signed)
Physical Therapy Session Note  Patient Details  Name: Jackson Figueroa MRN: [id number] Date of Birth: 07-16-1961  Today's Date: 01/06/2021 PT Individual Time: 0900-1000 PT Individual Time Calculation (min): 60 min   Short Term Goals: Week 3:  PT Short Term Goal 1 (Week 3): Pt will be able to perform sit <> stands with max assist with LRAD PT Short Term Goal 2 (Week 3): Pt will be able to gait with lift equipment x 20' PT Short Term Goal 3 (Week 3): Pt will be able to perform bed <> w/c transfers without use of lift equipment with min/mod assist  Skilled Therapeutic Interventions/Progress Updates:    Pt received seated EOB, agreeable to PT session. Pt reports 5/10 pain in RUE from nerve pain, receives medication from nursing at beginning of session. Sit to stand with min A to stedy from elevated bed. Stedy transfer to w/c. Manual w/c propulsion x 100 ft with use of BUE at Supervision level. Pt is Supervision for management of w/c parts. Squat pivot transfer w/c to mat table with CGA, cues to clear buttocks during transfer. Sit to stand with up to max A needed to stand to stedy from 23-24" mat table. Standing mini-squats 2 x 10 reps with limited range of knee flexion before pt unable to control buckling of LE, standing alt L/R stepping with heavy reliance on BUE for support, standing tolerance x 5 min while performing ball toss/volleyball with use of BUE. Pt has one near LOB anteriorly that he requires mod A to recover. Pt exhibits improved strength in BLE with ability to stand upright with decreased reliance on resting knees on platform in standing as well as increased endurance for standing this date. Squat pivot transfer back to w/c with CGA. Pt left seated in w/c in dayroom to hand off for OT session.  Therapy Documentation Precautions:  Precautions Precautions: Fall Precaution Comments: PEG, sacral and toe wounds Restrictions Weight Bearing Restrictions: No   Therapy/Group: Individual  Therapy   Excell Seltzer, PT, DPT  01/06/2021, 12:06 PM

## 2021-01-06 NOTE — Progress Notes (Signed)
Physical Therapy Session Note  Patient Details  Name: Jackson Figueroa MRN: [id number] Date of Birth: 05/25/1961  Today's Date: 01/06/2021 PT Individual Time: 1400-1500 PT Individual Time Calculation (min): 60 min   Short Term Goals: Week 1:  PT Short Term Goal 1 (Week 1): Patient will be able to transition sit <> supine with CGA PT Short Term Goal 1 - Progress (Week 1): Progressing toward goal PT Short Term Goal 2 (Week 1): Patient will be able to transfer with LRAD and MaxA PT Short Term Goal 2 - Progress (Week 1): Met PT Short Term Goal 3 (Week 1): Patient will tolerate standing with LRAD for >1 min PT Short Term Goal 3 - Progress (Week 1): Partly met Week 2:  PT Short Term Goal 1 (Week 2): Pt will tolerate standing x 5 min with LRAD PT Short Term Goal 1 - Progress (Week 2): Partly met PT Short Term Goal 2 (Week 2): Pt will complete least restrictive transfer with mod A consistently PT Short Term Goal 2 - Progress (Week 2): Met PT Short Term Goal 3 (Week 2): Pt will initiate gait training as safe and able PT Short Term Goal 3 - Progress (Week 2): Partly met Week 3:  PT Short Term Goal 1 (Week 3): Pt will be able to perform sit <> stands with max assist with LRAD PT Short Term Goal 2 (Week 3): Pt will be able to gait with lift equipment x 20' PT Short Term Goal 3 (Week 3): Pt will be able to perform bed <> w/c transfers without use of lift equipment with min/mod assist  Skilled Therapeutic Interventions/Progress Updates:    pt received sitting EOB and agreeable to therapy. Pt directed in squat pivot to WC min A. Pt directed in WC mobility 150 ' min A with smaller/tight turns and CGA grossly. PT completed rest of distance to ortho gym and directed in squat pivot to mat table min A. Pt directed in unsupported trunk balance and increased tolerance to activity with ball toss 5 mins x2 with various directional ball toss, no LOB grossly CGA. Pt then directed in squat pivot transfer back to WC min A.  Pt directed in WC mobility 200' CGA with VC for technique. Pt directed in squat pivot to bedside min A and CGA for sit>supine. CGA for positioning. Pt directed in supine UE strengthening exercises yellow theraband, 2x10 punches and tricep. Pt left supine in bed, alarm set, All needs in reach and in good condition. Call light in hand.  Family present.  Therapy Documentation Precautions:  Precautions Precautions: Fall Precaution Comments: PEG, sacral and toe wounds Restrictions Weight Bearing Restrictions: No General:   Vital Signs:   Pain:   Mobility:   Locomotion :    Trunk/Postural Assessment :    Balance:   Exercises:   Other Treatments:      Therapy/Group: Individual Therapy  Junie Panning 01/06/2021, 3:25 PM

## 2021-01-06 NOTE — Progress Notes (Signed)
Occupational Therapy Session Note  Patient Details  Name: Jackson Figueroa MRN: [id number] Date of Birth: 1961-01-22  Today's Date: 01/06/2021 OT Individual Time: 1000-1100 OT Individual Time Calculation (min): 60 min    Short Term Goals: Week 3:  OT Short Term Goal 1 (Week 3): Pt will be able to tolerate standing in [place] stedy for at least 3 minutes to demonstrate improved endurance. OT Short Term Goal 2 (Week 3): Pt will doff/don pants utilizing lateral leans while seated EOB or on BSC with min A OT Short Term Goal 3 (Week 3): Pt will perform DABSC transfer with CGA OT Short Term Goal 4 (Week 3): Pt will perform toilet hygiene with min A  Skilled Therapeutic Interventions/Progress Updates:    Pt resting in w/c upon arrival. OT focus on BUE [facility] and strengthening activites. Tests administered: 9 hole peg, box and block, dynamometer, and pinch strength 9 hole peg: R-45 secs, L-28 secs Box & Block: R-36, L-37 Dynamometer-R-7#, L-50# Pinch test-R-1#, L-10#  Pt engaged in theraputty activities to remove beads, form into ball, roll out, flatten, and pinch strengthening.  Pt propelled to room and transferred to bed with SB-CGA. Sit>supine with supervsion. Pt remained in bed with all needs within reach.  Therapy Documentation Precautions:  Precautions Precautions: Fall Precaution Comments: PEG, sacral and toe wounds Restrictions Weight Bearing Restrictions: No   Pain: Pain Assessment Pain Scale: 0-10 Pain Score: 0-No pain Pain Type: Chronic pain Pain Location: Hand Pain Orientation: Right Pain Descriptors / Indicators: Aching Pain Frequency: Intermittent Pt also c/o R foot pain on dorsal surface and B/L knee pain-ice pack applied Therapy/Group: Individual Therapy  [name] 01/06/2021, 12:10 PM

## 2021-01-07 MED ORDER — DOCUSATE SODIUM 50 MG PO CAPS
50.0000 mg | ORAL_CAPSULE | Freq: Two times a day (BID) | ORAL | Status: DC | PRN
  Filled 2021-01-07: qty 1

## 2021-01-07 MED ORDER — POLYETHYLENE GLYCOL 3350 17 G PO PACK: 17.0000 g | PACK | Freq: Every day | ORAL | Status: DC | PRN

## 2021-01-07 MED ORDER — COVID-19 MRNA VAC-TRIS(PFIZER) 30 MCG/0.3ML IM SUSP
0.3000 mL | Freq: Once | INTRAMUSCULAR | Status: AC
  Administered 2021-01-10: 0.3 mL via INTRAMUSCULAR
  Filled 2021-01-07: qty 0.3

## 2021-01-07 NOTE — Progress Notes (Signed)
[place] PHYSICAL MEDICINE & REHABILITATION PROGRESS NOTE   Subjective/Complaints:  Pt reports has disability forms- bowels a little loose.   Wanted to make sure getting COVID vaccine Friday.   Wants to make bowel meds prn.   ROS:  Pt denies SOB, abd pain, CP, N/V/C/D, and vision changes   Objective:   No results found. Recent Labs    01/06/21 0510  WBC 5.7  HGB 8.7*  HCT 27.1*  PLT 217   Recent Labs    01/06/21 0510  NA 140  K 4.6  CL 106  CO2 26  GLUCOSE 86  BUN 23*  CREATININE 1.65*  CALCIUM 9.2    Intake/Output Summary (Last 24 hours) at 01/07/2021 0945 Last data filed at 01/07/2021 0800 Gross per 24 hour  Intake 708 ml  Output 600 ml  Net 108 ml     Pressure Injury 11/18/20 Toe (Comment  which one) Anterior;Right Deep Tissue Pressure Injury - Purple or maroon localized area of discolored intact skin or blood-filled blister due to damage of underlying soft tissue from pressure and/or shear. DTI left  (Active)  11/18/20 2330  Location: Toe (Comment  which one) (LEFT big toe)  Location Orientation: Anterior;Right  Staging: Deep Tissue Pressure Injury - Purple or maroon localized area of discolored intact skin or blood-filled blister due to damage of underlying soft tissue from pressure and/or shear.  Wound Description (Comments): DTI left toe  Present on Admission: Yes (on admit to 35M)    Physical Exam: Vital Signs Blood pressure 105/79, pulse 85, temperature 98.2 F (36.8 C), resp. rate 18, height 6' (1.829 m), weight 110 kg, SpO2 99 %. Constitutional:  Awake, sitting EOB, appropriate, NAD HEENT: EOMI, oral membranes moist Neck: supple Cardiovascular: RRR- no JVD Respiratory/Chest: CTA B/L- no W/R/R- good air movement  GI/Abdomen: Soft, NT, ND, (+)BS  Ext: no clubbing, cyanosis, or edema Psych: appropriate Skin: Warm and dry.   Sacrum- pictured below--stable Musc: No edema in extremities.  No tenderness in extremities. Neuro: Ox3 Right  ulnar neuropathy- numbness in lateral 4th/5th digit (more in 5th digit numbness) on R with intrinsic atrophy- exam is consistent- no change Motor: Right upper extremity: Shoulder abduction 4 --4/5, distally 3/5 Left upper extremity: 4/5 proximal distal Bilateral lower extremities: Hip flexion, knee extension 3 -/5, ankle dorsiflexion 4/5     Assessment/Plan: 1. Functional deficits which require 3+ hours per day of interdisciplinary therapy in a comprehensive inpatient rehab setting.  Physiatrist is providing close team supervision and 24 hour management of active medical problems listed below.  Physiatrist and rehab team continue to assess barriers to discharge/monitor patient progress toward functional and medical goals  Care Tool:  Bathing    Body parts bathed by patient: Right arm,Left arm,Chest,Abdomen,Front perineal area,Right upper leg,Left upper leg,Right lower leg,Left lower leg,Face   Body parts bathed by helper: Buttocks     Bathing assist Assist Level: Minimal Assistance - Patient > 75%     Upper Body Dressing/Undressing Upper body dressing   What is the patient wearing?: Pull over shirt    Upper body assist Assist Level: Supervision/Verbal cueing    Lower Body Dressing/Undressing Lower body dressing      What is the patient wearing?: Pants     Lower body assist Assist for lower body dressing: Moderate Assistance - Patient 50 - 74%     Toileting Toileting    Toileting assist Assist for toileting: Dependent - Patient 0% (using bariatric Stedy) Assistive Device Comment: Urinal  Transfers Chair/bed transfer  Transfers assist  Chair/bed transfer activity did not occur: Safety/medical concerns (due to weakness/fatigue)  Chair/bed transfer assist level: Dependent - mechanical lift     Locomotion Ambulation   Ambulation assist   Ambulation activity did not occur: Safety/medical concerns (due to weakness/fatigue)          Walk 10 feet  activity   Assist  Walk 10 feet activity did not occur: Safety/medical concerns (due to weakness/fatigue)        Walk 50 feet activity   Assist Walk 50 feet with 2 turns activity did not occur: Safety/medical concerns (due to weakness/fatigue)         Walk 150 feet activity   Assist Walk 150 feet activity did not occur: Safety/medical concerns (due to weakness/fatigue)         Walk 10 feet on uneven surface  activity   Assist Walk 10 feet on uneven surfaces activity did not occur: Safety/medical concerns (due to weakness/fatigue)         Wheelchair     Assist Will patient use wheelchair at discharge?: Yes Type of Wheelchair: Manual Wheelchair activity did not occur: Safety/medical concerns (due to weakness/fatigue)  Wheelchair assist level: Supervision/Verbal cueing Max wheelchair distance: 100'    Wheelchair 50 feet with 2 turns activity    Assist    Wheelchair 50 feet with 2 turns activity did not occur: Safety/medical concerns (due to weakness/fatigue)   Assist Level: Supervision/Verbal cueing   Wheelchair 150 feet activity     Assist  Wheelchair 150 feet activity did not occur: Safety/medical concerns (due to weakness/fatigue)   Assist Level: Supervision/Verbal cueing   Blood pressure 105/79, pulse 85, temperature 98.2 F (36.8 C), resp. rate 18, height 6' (1.829 m), weight 110 kg, SpO2 99 %.    Medical Problem List and Plan: 1.  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  Antithrombotics: -DVT/anticoagulation:  Pharmaceutical: Lovenox             -antiplatelet therapy: N/A 3. Pain Management: tylenol prn  tramadol 50 mg q6 hours prn  Continue Lyrica  Controlled meds on 2/5  2/7- Lyrica helping nerve pain a lot and tramadol helping all over body aches- con't regimen  2/8- asked for  Ibuprofen 600 mg TID prn- explained could increase bleeding risk - he still wants it- ordered  2/9- changed to 2x/day max but can be given q8 hours- also went over again about kidney issues- will con't for now  2/10- said ibuprofen is helping R wrist pain more than anything, esp when combined with tramadol  2/11- reinforced I'd really like him to take Ibuprofen 1-2x/day- no more- and prefer 1x/day due to elevated Cr- he voiced understanding  2/13 increased RUE ulnar pain   -GFR 54--> increase lyrica to 75mg  tid  2/14- increased lyrica to 75 mg TID yesterday- but with Cr up to 1.65 cannot increase more- can try Duloxetine 30 mg nightly for nerve pain  2/15- d/w pt- he wanted it increased, but explained can not increase Lyrica right now- explained will take a few days for Duloxetine to help 4. Mood: LCSW to follow for evaluation and support.              -antipsychotic agents: Seroquel 25 5. Neuropsych: This patient is capable of making decisions on his  own behalf. 6. Skin/Wound Care:                          DTI with MASD: areas on buttocks improving.  Zinc for moisture barrier as well as pressure relief measures.  2/10- will try to get off specialty bed- looking at backside when not in therapy/in w/c.   2/11-12- off specialty bed- buttocks healed as per pic above    2/14- still on low air loss mattress- will rmeove     7. Fluids/Electrolytes/Nutrition: Monitor I/O. Encourage fluid intake. Continue to monitor lytes with serial checks.  8. Morbid obesity: Educate on importance of diet and weight loss to help promote overall health. Encourage side lying when in bed.  9. Pre-renal azotemia vs new CKD Stage IIIa: Improving off tube feeds--encourage fluid intake.              Creatinine 1.48 on 2/3, labs ordered for Monday  2/7- Cr up to 1.57 again- Im wondering if has a new baseline- because is always there- ~ 1.4-1.5 unless gets IVFs-   2/9- will recheck in AM and see how doing on Ibuprofen  2/10-  Cr 1.51- stable and BUN 26- stable - likely Cr ~ 1.50 is new baseline  2/11- went over this issue with pt- he voiced understanding  2/14- Cr up to 1.65- if doesn't drink more, will have to do IVFs/use PEG again.  2/15- will check labs Thursday 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              Hemoglobin 9.5 on 2/3  overall stable- con't regimen 11. Encephalopathy: Has resolved. Continue Seroquel to help with sleep hygeine.  12.  Dysphagia: Resolved  2/2- PEG was placed 12/30- so cannot be removed til minimum 3/1  13. Recent trach: Closed  2/9- has some trach site drainage- appears purulent- will start Keflex 500 mg BID (per CrCl) x 3 days- and see how it goes  2/10- no drainage seen this AM, but will con't to monitor- WBC is better at 5.9k  2/13- stoma closed after silver nitrate, can use bandaid or no dressing  2/14- closed/healed  14.  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  Lyrica 50 mg TID_ explained won't help numbness/weakness, only nerve pain.  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 Muscle spasms  2/8- will try Skelaxin 800 mg TID prn for muscle spasms- since c/o severe muscle tightness  2/9- didn't complain of muscle spasms today 17. Previous HTN  2/11- refusing BP meds- will stop 18. Insomnia  2/11- stopped Seroquel per pt request 19. Loose stools  2/15- make colace prn   LOS: 20 days A FACE TO FACE EVALUATION WAS PERFORMED  Jackson Figueroa 01/07/2021, 9:45 AM

## 2021-01-07 NOTE — Progress Notes (Signed)
Occupational Therapy Session Note  Patient Details  Name: Jackson Figueroa MRN: [id number] Date of Birth: Jun 05, 1961  Today's Date: 01/07/2021 OT Individual Time: 0800-0900 OT Individual Time Calculation (min): 60 min    Short Term Goals: Week 3:  OT Short Term Goal 1 (Week 3): Pt will be able to tolerate standing in [place] stedy for at least 3 minutes to demonstrate improved endurance. OT Short Term Goal 2 (Week 3): Pt will doff/don pants utilizing lateral leans while seated EOB or on BSC with min A OT Short Term Goal 3 (Week 3): Pt will perform DABSC transfer with CGA OT Short Term Goal 4 (Week 3): Pt will perform toilet hygiene with min A  Skilled Therapeutic Interventions/Progress Updates:    Pt sitting EOB upon arrival. Squat pivot transfer to w/c with supervision. Pt propelled w/c to day room and tranfserred to EOM. Initial focus on BUE strengthening with 2# bar. Pt tapped beach ball to therapist  5x15. Pt transferred back to w/c and propelled w/c to table for Rt hand strengthening with graded clothes pins; placing on dowel and edge of container. Pt donned gloves to assist with cleaning but stated that pressure of glove on Rt hand was too painful. Pt returned to room and transferred back to bed with supervision. Pt remained in bed with all needs within reach.   Therapy Documentation Precautions:  Precautions Precautions: Fall Precaution Comments: PEG, sacral and toe wounds Restrictions Weight Bearing Restrictions: No    Pain: Pt c/o increased pain along lateral surface of RUE including 5th digit with activity; RN/MD aware   Therapy/Group: Individual Therapy  [name] 01/07/2021, 9:59 AM

## 2021-01-07 NOTE — Progress Notes (Signed)
Physical Therapy Session Note  Patient Details  Name: Jackson Figueroa MRN: [id number] Date of Birth: 07-28-1961  Today's Date: 01/07/2021 PT Individual Time: 1000-1100 PT Individual Time Calculation (min): 60 min   Short Term Goals: Week 3:  PT Short Term Goal 1 (Week 3): Pt will be able to perform sit <> stands with max assist with LRAD PT Short Term Goal 2 (Week 3): Pt will be able to gait with lift equipment x 20' PT Short Term Goal 3 (Week 3): Pt will be able to perform bed <> w/c transfers without use of lift equipment with min/mod assist  Skilled Therapeutic Interventions/Progress Updates:    Pt received seated EOB, agreeable to PT session. Pt reports ongoing nerve pain in RUE, premedicated prior to start of therapy session. Squat pivot transfer with CGA throughout session, cues for clearing buttocks during transfer to prevent skin abrasion. Pt demos fair ability to clear buttocks during transfer especially with onset of fatigue. Manual w/c propulsion x 125 ft with use of BUE at Supervision level. Session focus on use of LiteGait for body-weight supported gait training. Pt is able to ambulate x 29', x 50', and x 80' while in [place]. Pt has onset of B knee buckling with onset of fatigue but exhibits increased distance each trial. Pt exhibits narrow BOS with occasional RLE scissoring during gait. Pt continues to exhibit improved tolerance for standing and improved LE strength and endurance. Pt returned to bed at end of session, Supervision for bed mobility. Pt left supine in bed with needs in reach, ice packs to B knees at end of session.  Therapy Documentation Precautions:  Precautions Precautions: Fall Precaution Comments: PEG, sacral and toe wounds Restrictions Weight Bearing Restrictions: No   Therapy/Group: Individual Therapy   Excell Seltzer, PT, DPT  01/07/2021, 5:19 PM

## 2021-01-07 NOTE — Progress Notes (Addendum)
Patient resting and in no acute distress, verbalized discomfort to right hand and medicated with Advil per orders. Patient states that he was told by his  Dr on rounds yesterday of increasing his po Lyrica dosage,and he wanted to   follow up today as to why that was not done. Reassured patient that this was the best thing to do. Patient remains on Contact Isolation. No acute distress, continue medical regime.

## 2021-01-07 NOTE — Progress Notes (Signed)
Patient ID: Jackson Figueroa, male   DOB: 06-Jun-1961, 60 y.o.   MRN: [id number]  SW met with pt in room to provide updates from team conference, and pt will need ramp and hospital bed at discharge. Pt is going to discuss with his s/o. Pt provided SW with STD forms for an extension as pt reports his short term disability will terminate on 3/5 without an extension. SW provided forms to PA.  Loralee Pacas, MSW, [place] Office: [phone] Cell: [phone] Fax: [phone]

## 2021-01-07 NOTE — Progress Notes (Signed)
Occupational Therapy Session Note  Patient Details  Name: Jackson Figueroa MRN: [id number] Date of Birth: August 06, 1961  Today's Date: 01/07/2021 OT Individual Time: 1300-1415 OT Individual Time Calculation (min): 75 min    Short Term Goals: Week 2:  OT Short Term Goal 1 (Week 2): Pt will be able to tolerate standing in [place] stedy for at least 3 minutes to demonstrate improved endurance. OT Short Term Goal 1 - Progress (Week 2): Progressing toward goal OT Short Term Goal 2 (Week 2): Pt will be able to don and doff pants over hips using a bridge in bed from supine. OT Short Term Goal 2 - Progress (Week 2): Met OT Short Term Goal 3 (Week 2): Pt will be able to reach R arm back behind his hips to be able to adjust pants and increase potential for full cleansing. OT Short Term Goal 3 - Progress (Week 2): Met Week 3:  OT Short Term Goal 1 (Week 3): Pt will be able to tolerate standing in [place] stedy for at least 3 minutes to demonstrate improved endurance. OT Short Term Goal 2 (Week 3): Pt will doff/don pants utilizing lateral leans while seated EOB or on BSC with min A OT Short Term Goal 3 (Week 3): Pt will perform DABSC transfer with CGA OT Short Term Goal 4 (Week 3): Pt will perform toilet hygiene with min A  Skilled Therapeutic Interventions/Progress Updates:    Patient seated edge of bed, ready for session.  Sit pivot transfer to/from bed, w/c and mat surface with CS/CGA.  He is able to propel w/c 50 feet, fatigue limiting this afternoon.   Completed seated posture, trunk mobility, shoulder/scapular mobility, pelvic mobility, stretching, balance and general conditioning activities.  W/c swapped to provide deeper seat, provided solid back and adjusted ROHO cushion.  He is able to manage leg rests and arm rest.   UB ergometer x 5 minutes.  He returned to bed at close of session, wife present and call bell in reach.    Therapy Documentation Precautions:  Precautions Precautions: Fall Precaution  Comments: PEG, sacral and toe wounds Restrictions Weight Bearing Restrictions: No   Therapy/Group: Individual Therapy  Carlos Levering 01/07/2021, 7:46 AM

## 2021-01-07 NOTE — Patient Care Conference (Signed)
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 Courtney Heys Care Coodinator Present: Loralee Pacas, LCSWA;Ellarose Brandi Creig Hines, RN, BSN, [place] Nurse Present: Other (comment) PT Present: Excell Seltzer, PT OT Present: Roanna Epley, COTA;Jennifer Tamala Julian, OT PPS Coordinator present : Ileana Ladd, Burna Mortimer, SLP     Current Status/Progress Goal Weekly Team Focus  Bowel/Bladder   Continent of Bladder/Bowel , [facility] 01/06/21  Maintain continence  Assess QS/PRN tolieting needs, address concerns   Swallow/Nutrition/ Hydration             ADL's   SB transfers with CGA; UB bathing/dressing-supervision; LB bathing-min A; LB dressing-min A; toileting-min A  CGA overall      Mobility   Supervision bed mobility, CGA squat pivot vs SB transfer, mod to +2 to stand to stedy, transfers via stedy (progressed from [employer]), Supervision w/c mobility, gait x 10 ft in LiteGait  CGA overall, gait 50 ft CGA, min A x 14 stairs (likely will need to downgrade gait and stair goals pending progress)  LE NMR, standing as able, gait training in LiteGait   Communication             Safety/Cognition/ Behavioral  Observations            Pain   C/O right hand pain, numbness, tingling sensation radiating to right elbow, has prn medications Advil 600mg  appears to have the best relief outcome per patient Ultram  Decrease pain to level 2-3/10 (tolerable range per patient)  QS/PRN assessment, administer medications with follow up within standard quidelines   Skin   MASD buttocks areas resolving,  prevent infection and skin breakdown        Discharge Planning:  Pt to d/c to home with s/o [name] who will provide 24/7 care to pt. She has back issues so patient would be more appropriate for atleast Supervision to Mod I at d/c to reduce caregier burden.   Team Discussion: Having right ulnar neuropathy, increased Lyrica Sunday, patient wants another increase, MD not currently okay with higher dose. PEG can be discontinued 01/21/2021. Push fluids, due for Covid vaccine on Friday. Is in a regular [place] bed now, bottom is healed. Ice to knees and shoulder. Continent B/B.  Patient on target to meet rehab goals: Squat pivot transfers contact guard, W/C mobility supervision, gait with Lite Gait x 10 ft. Stairs a barrier at home, needs a ramp. Working on upper extremity strengthening and function. Limited access in home, bed in home is high off floor, will need to lower.  *See Care Plan and progress notes for long and short-term goals.   Revisions to Treatment Plan:  MD added Duloxetine for nerve pain.  Teaching Needs: Family education, medication management, skin/wound care, transfer training, gait training, PEG management.  Current Barriers to Discharge: Inaccessible home environment, Decreased caregiver support, Home enviroment access/layout, Wound care, Lack of/limited family support, Medication compliance and Behavior  Possible Resolutions to Barriers: Continue current medications, provide emotional support to patient and family.     Medical Summary Current Status: on regular bed now; bottom healed  now/scarred; increased Lyrica 2/13 and added Duloxetine for nerve pain- looser stools continent B/B  Barriers to Discharge: Decreased family/caregiver support;Home enviroment access/layout;Medical stability;Weight  Barriers to Discharge Comments: PEG can be removed 3/1- needs ramp for dr's appt, etc; Cr up to 1.65- needs to drink more- might need hospital bed?; d/c 3/1 Possible Resolutions to [employer] Focus: S- w/c mobility; walking 80 ft lite gait only- min-mod A; needs to be able to do stairs? lots of stairs- ADLs- min A to Supervision;   Continued Need for Acute Rehabilitation Level of Care: The patient requires daily medical management by a physician with specialized training in physical medicine and rehabilitation for the following reasons: Direction of a multidisciplinary physical rehabilitation program to maximize functional independence : Yes Medical management of patient stability for increased activity during participation in an intensive rehabilitation regime.: Yes Analysis of laboratory values and/or radiology reports with any subsequent need for medication adjustment and/or medical intervention. : Yes   I attest that I was present, lead the team conference, and concur with the assessment and plan of the team.   Cristi Loron 01/07/2021, 4:08 PM

## 2021-01-08 MED ORDER — ENOXAPARIN SODIUM 30 MG/0.3ML ~~LOC~~ SOLN
30.0000 mg | SUBCUTANEOUS | Status: DC
  Administered 2021-01-09 – 2021-01-10 (×2): 30 mg via SUBCUTANEOUS
  Filled 2021-01-08 (×2): qty 0.3

## 2021-01-08 MED ORDER — IBUPROFEN 400 MG PO TABS
600.0000 mg | ORAL_TABLET | Freq: Every day | ORAL | Status: DC | PRN
  Administered 2021-01-09 – 2021-01-12 (×3): 600 mg via ORAL
  Filled 2021-01-08 (×4): qty 2

## 2021-01-08 NOTE — Progress Notes (Signed)
Occupational Therapy Session Note  Patient Details  Name: Jackson Figueroa MRN: [id number] Date of Birth: 1961-01-31  Today's Date: 01/08/2021 OT Individual Time: 4832-3468 OT Individual Time Calculation (min): 83 min    Short Term Goals: Week 4:  OT Short Term Goal 1 (Week 4): Pt will be able to tolerate standing in [place] stedy for at least 3 minutes to demonstrate improved endurance. OT Short Term Goal 2 (Week 4): Pt will perform toilet hygiene with min A  Skilled Therapeutic Interventions/Progress Updates:    OT intervention with focus on functional transfers, bathing at shower level, dressing EOB, discharge planning, and safety awareness to increase independence with BADLs. All scoot transfers with CGA (bed>w/c>TTB>wc>bed>w/c). Bathing seated on TTB with lateral leans and use of long handle sponge with CGA. Dressing EOB with CGA. Grooming at sink with supervision. Discussed use of TTB to use with tub transfers at home. Information provided. Recommended DABSC. Pt in agreement. Pt returned to bed and remained seated EOB awaiting next therapy.  Therapy Documentation Precautions:  Precautions Precautions: Fall Precaution Comments: PEG, sacral and toe wounds Restrictions Weight Bearing Restrictions: No    Pain:  Pt c/o Rt hand discomfort (chronic); shower helped per pt report   Therapy/Group: Individual Therapy  [name] 01/08/2021, 9:45 AM

## 2021-01-08 NOTE — Progress Notes (Signed)
[facility] PHYSICAL MEDICINE & REHABILITATION PROGRESS NOTE   Subjective/Complaints:  Pt's disability forms filled out- and sent to SW to fax- which was done.   Pt reports trying to drink well (according to OT, doing really well), but his kidney function wasn't better this week- will recheck labs tomorrow to see what's going on.   No change in nerve pain so far.  In regular bed now- fits much better.  Showering currently- feels good in hot water.   ROS:   Pt denies SOB, abd pain, CP, N/V/C/D, and vision changes   Objective:   No results found. Recent Labs    01/06/21 0510  WBC 5.7  HGB 8.7*  HCT 27.1*  PLT 217   Recent Labs    01/06/21 0510  NA 140  K 4.6  CL 106  CO2 26  GLUCOSE 86  BUN 23*  CREATININE 1.65*  CALCIUM 9.2    Intake/Output Summary (Last 24 hours) at 01/08/2021 1910 Last data filed at 01/08/2021 1900 Gross per 24 hour  Intake 472 ml  Output 1850 ml  Net -1378 ml     Pressure Injury 11/18/20 Toe (Comment  which one) Anterior;Right Deep Tissue Pressure Injury - Purple or maroon localized area of discolored intact skin or blood-filled blister due to damage of underlying soft tissue from pressure and/or shear. DTI left  (Active)  11/18/20 2330  Location: Toe (Comment  which one) (LEFT big toe)  Location Orientation: Anterior;Right  Staging: Deep Tissue Pressure Injury - Purple or maroon localized area of discolored intact skin or blood-filled blister due to damage of underlying soft tissue from pressure and/or shear.  Wound Description (Comments): DTI left toe  Present on Admission: Yes (on admit to 36M)    Physical Exam: Vital Signs Blood pressure 111/77, pulse 79, temperature 98.1 F (36.7 C), resp. rate 18, height 6' (1.829 m), weight 110 kg, SpO2 98 %. Constitutional:  Awake, sitting in shower, appropriate, NAD HEENT: EOMI, oral membranes moist Neck: supple Cardiovascular: RRR_ no JVD Respiratory/Chest: no resp distress; normal  rate GI/Abdomen:  nondistended  Ext: no clubbing, cyanosis, or edema seen while in shower Psych: appropriate Skin: Warm and dry.   Sacrum- pictured below--stable- resolved Musc: No edema in extremities.  No tenderness in extremities. Neuro: Ox3 Right ulnar neuropathy- numbness in lateral 4th/5th digit (more in 5th digit numbness) on R with intrinsic atrophy- exam is consistent- no change Motor: Right upper extremity: Shoulder abduction 4 --4/5, distally 3/5 Left upper extremity: 4/5 proximal distal Bilateral lower extremities: Hip flexion, knee extension 3 -/5, ankle dorsiflexion 4/5     Assessment/Plan: 1. Functional deficits which require 3+ hours per day of interdisciplinary therapy in a comprehensive inpatient rehab setting.  Physiatrist is providing close team supervision and 24 hour management of active medical problems listed below.  Physiatrist and rehab team continue to assess barriers to discharge/monitor patient progress toward functional and medical goals  Care Tool:  Bathing    Body parts bathed by patient: Right arm,Left arm,Chest,Abdomen,Front perineal area,Right upper leg,Left upper leg,Right lower leg,Left lower leg,Face,Buttocks   Body parts bathed by helper: Buttocks     Bathing assist Assist Level: Minimal Assistance - Patient > 75%     Upper Body Dressing/Undressing Upper body dressing   What is the patient wearing?: Pull over shirt    Upper body assist Assist Level: Independent    Lower Body Dressing/Undressing Lower body dressing      What is the patient wearing?: Pants  Lower body assist Assist for lower body dressing: Minimal Assistance - Patient > 75%     Toileting Toileting    Toileting assist Assist for toileting: Dependent - Patient 0% (using bariatric Stedy) Assistive Device Comment: Urinal   Transfers Chair/bed transfer  Transfers assist  Chair/bed transfer activity did not occur: Safety/medical concerns (due to  weakness/fatigue)  Chair/bed transfer assist level: Contact Guard/Touching assist     Locomotion Ambulation   Ambulation assist   Ambulation activity did not occur: Safety/medical concerns (due to weakness/fatigue)  Assist level: Dependent - Patient 0% Assistive device: Lite Gait Max distance: 80'   Walk 10 feet activity   Assist  Walk 10 feet activity did not occur: Safety/medical concerns (due to weakness/fatigue)  Assist level: Dependent - Patient 0% Assistive device: Lite Gait   Walk 50 feet activity   Assist Walk 50 feet with 2 turns activity did not occur: Safety/medical concerns (due to weakness/fatigue)  Assist level: Dependent - Patient 0% Assistive device: Lite Gait    Walk 150 feet activity   Assist Walk 150 feet activity did not occur: Safety/medical concerns (due to weakness/fatigue)         Walk 10 feet on uneven surface  activity   Assist Walk 10 feet on uneven surfaces activity did not occur: Safety/medical concerns (due to weakness/fatigue)         Wheelchair     Assist Will patient use wheelchair at discharge?: Yes Type of Wheelchair: Manual Wheelchair activity did not occur: Safety/medical concerns (due to weakness/fatigue)  Wheelchair assist level: Supervision/Verbal cueing Max wheelchair distance: 100'    Wheelchair 50 feet with 2 turns activity    Assist    Wheelchair 50 feet with 2 turns activity did not occur: Safety/medical concerns (due to weakness/fatigue)   Assist Level: Supervision/Verbal cueing   Wheelchair 150 feet activity     Assist  Wheelchair 150 feet activity did not occur: Safety/medical concerns (due to weakness/fatigue)   Assist Level: Supervision/Verbal cueing   Blood pressure 111/77, pulse 79, temperature 98.1 F (36.7 C), resp. rate 18, height 6' (1.829 m), weight 110 kg, SpO2 98 %.    Medical Problem List and Plan: 1.  Diffuse weakness with decreased endurance, balance deficits and  pain affecting mobility and ADLs secondary to CIP/CIM- I.e ICU myopathy/neuropathy             Continue CIR PT, OT 2.  Antithrombotics: -DVT/anticoagulation:  Pharmaceutical: Lovenox             -antiplatelet therapy: N/A 3. Pain Management: tylenol prn  tramadol 50 mg q6 hours prn  Continue Lyrica  Controlled meds on 2/5  2/11- reinforced I'd really like him to take Ibuprofen 1-2x/day- no more- and prefer 1x/day due to elevated Cr- he voiced understanding  2/14- increased lyrica to 75 mg TID yesterday- but with Cr up to 1.65 cannot increase more- can try Duloxetine 30 mg nightly for nerve pain  2/15- d/w pt- he wanted it increased, but explained can not increase Lyrica right now- explained will take a few days for Duloxetine to help  2/16- Ibuprofen changed to daily prn- will d/w pt again tomorrow most likely cause of kidney fxn getting worse- will see if can stop ibuprofen 4. Mood: LCSW to follow for evaluation and support.              -antipsychotic agents: Seroquel 25 5. Neuropsych: This patient is capable of making decisions on his own behalf. 6. Skin/Wound Care:  DTI with MASD: areas on buttocks improving.  Zinc for moisture barrier as well as pressure relief measures.  2/10- will try to get off specialty bed- looking at backside when not in therapy/in w/c.   2/11-12- off specialty bed- buttocks healed as per pic above    2/14- still on low air loss mattress- will rmeove     2/16- on regular bed that can stretch to his height 7. Fluids/Electrolytes/Nutrition: Monitor I/O. Encourage fluid intake. Continue to monitor lytes with serial checks.  8. Morbid obesity: Educate on importance of diet and weight loss to help promote overall health. Encourage side lying when in bed.  9. Pre-renal azotemia vs new CKD Stage IIIa: Improving off tube feeds--encourage fluid intake.              Creatinine 1.48 on 2/3, labs ordered for Monday  2/7- Cr up to 1.57 again- Im  wondering if has a new baseline- because is always there- ~ 1.4-1.5 unless gets IVFs-   2/9- will recheck in AM and see how doing on Ibuprofen  2/10- Cr 1.51- stable and BUN 26- stable - likely Cr ~ 1.50 is new baseline  2/11- went over this issue with pt- he voiced understanding  2/14- Cr up to 1.65- if doesn't drink more, will have to do IVFs/use PEG again.  2/15- will check labs Thursday  2/16- labs tomorrow- Cr issues likely due to Ibuprofen 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              Hemoglobin 9.5 on 2/3  overall stable- con't regimen 11. Encephalopathy: Has resolved. Continue Seroquel to help with sleep hygeine.  12.  Dysphagia: Resolved  2/2- PEG was placed 12/30- so cannot be removed til minimum 3/1  13. Recent trach: Closed  2/9- has some trach site drainage- appears purulent- will start Keflex 500 mg BID (per CrCl) x 3 days- and see how it goes  2/10- no drainage seen this AM, but will con't to monitor- WBC is better at 5.9k  2/13- stoma closed after silver nitrate, can use bandaid or no dressing  2/14- closed/healed  14.  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  Lyrica 50 mg TID_ explained won't help numbness/weakness, only nerve pain.  2/7- helping nerve pain a lot- con't regimen  2/16- added Duloxetine 30 mg nightly and Lyrica now 75 mg TID- not enough per pt- trying to see if Duloxetine will kick in.  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 Muscle spasms  2/8- will try Skelaxin 800 mg TID prn for muscle spasms- since c/o severe muscle tightness  2/9- didn't complain of muscle spasms today 17. Previous HTN  2/11- refusing BP meds- will stop 18. Insomnia  2/11- stopped Seroquel per pt request 19. Loose stools  2/15- make colace prn  I spent a total of 25 minutes on care today- >50%  coordination of care going over chart regarding kidney issues and trying to figure out cause- will also change Lovenox to 30 mg qday for now due to kidney issues.     LOS: 21 days A FACE TO FACE EVALUATION WAS PERFORMED  Jackson Figueroa 01/08/2021, 7:10 PM

## 2021-01-08 NOTE — Progress Notes (Signed)
Physical Therapy Session Note  Patient Details  Name: Jackson Figueroa MRN: [id number] Date of Birth: 1960-12-25  Today's Date: 01/08/2021 PT Individual Time: 1458-1535 PT Individual Time Calculation (min): 37 min   Short Term Goals: Week 3:  PT Short Term Goal 1 (Week 3): Pt will be able to perform sit <> stands with max assist with LRAD PT Short Term Goal 2 (Week 3): Pt will be able to gait with lift equipment x 20' PT Short Term Goal 3 (Week 3): Pt will be able to perform bed <> w/c transfers without use of lift equipment with min/mod assist  Skilled Therapeutic Interventions/Progress Updates:    Patient seated EOB.  Sit to supine with S and pulled up toward [facility] with bed in trenedelenberg.  Supine for therex consisting of bridging, SLR, lateral trunk rotation, crunches, sidelying clamshell hip abduction with orange t-band, then supine to sit CGA with rail.  Sit to stand holding back of w/c from higher surface with momentum and min to mod A x 5 reps.  Side steps toward [facility] with A to move w/c.  Patient sit to supine with S.  Left in supine with call bell in reach and bed alarm active.   Therapy Documentation Precautions:  Precautions Precautions: Fall Precaution Comments: PEG, sacral and toe wounds Restrictions Weight Bearing Restrictions: No Pain: Pain Assessment Faces Pain Scale: No hurt    Therapy/Group: Individual Therapy  Reginia Naas  [place], [place] 01/08/2021, 4:09 PM

## 2021-01-08 NOTE — Progress Notes (Signed)
Physical Therapy Session Note  Patient Details  Name: Jackson Figueroa MRN: [id number] Date of Birth: 1961-06-21  Today's Date: 01/08/2021 PT Individual Time: 1110-1205 and 4098-1191 PT Individual Time Calculation (min): 55 min and 37 min  Short Term Goals: Week 3:  PT Short Term Goal 1 (Week 3): Pt will be able to perform sit <> stands with max assist with LRAD PT Short Term Goal 2 (Week 3): Pt will be able to gait with lift equipment x 20' PT Short Term Goal 3 (Week 3): Pt will be able to perform bed <> w/c transfers without use of lift equipment with min/mod assist  Skilled Therapeutic Interventions/Progress Updates: Tx1: Pt presented sitting EOB agreeable to therapy. Pt c/o R hand pain but premedicated. Performed squat pivot transfer to w/c with CGA and PTA stabilizing w/c. Pt transported to rehab gym for energy conservation and performed squat pivot transfer to mat. Remaining session focused on STS transfers with RW from varying levels. Pt started at 23in requiring minA nearing CGA. PTA then lowered mat incrementally an inch with pt performing 2 STS each with consistent minA requiring near modA at 21in. Pt then performed additional x 2 STS at 21in with standing march x 10 bilaterally each bout. Throughout activity pt with x 1 occurrence of knee buckling with fatigue. Pt then performed squat pivot back to w/c with CGA and transported back to room. Performed squat pivot CGA to bed in same manner as prior. Pt left sitting EOB at end of session with bed alarm on, call bell within reach and needs met.   Tx2: Pt presented in bed sleeping but easily aroused and agreeable to therapy. Pt states hand feels slightly better but continues to have some pain, no intervention requested. Performed supine to sit with supervision and use of bed features. Performed squat pivot transfer to w/c with CGA. Pt transported to day room and performed squat pivot to NuStep with CGA and PTA stabilizing w/c. Pt participated in  [place] for general conditioning and BLE strengthening. Performed L3 x 5 min with all 4 extremities maintaining 40-50 SPM and with pt indicating 9 on BORG scale. After brief rest pt performed additional 5 min at L4 with 4 extremities with pt demonstrating increased exertion and maintaining ~40SPM. On third bout pt performed L2 with BLE only for strengthening maintaining ~20-30 SPM. Pt then performed squat pivot back to w/c requiring increased effort and demonstrating less clearance at buttocks.  Pt transported back to room and performed more of a lateral scoot to bed vs squat pivot due to fatigue. Pt returned to supine with supervision and increased time. PTA noted that pt's bed shifts to reverse Trendelenburg and notified nsg requesting to change bed. Pt left with bed alarm on, call bell within reach and needs met.      Therapy Documentation Precautions:  Precautions Precautions: Fall Precaution Comments: PEG, sacral and toe wounds Restrictions Weight Bearing Restrictions: No General:   Vital Signs:   Pain:   Mobility:   Locomotion :    Trunk/Postural Assessment :    Balance:   Exercises:   Other Treatments:      Therapy/Group: Individual Therapy  Tyton Abdallah 01/08/2021, 3:59 PM

## 2021-01-08 NOTE — Progress Notes (Signed)
Occupational Therapy Weekly Progress Note  Patient Details  Name: Jackson Figueroa MRN: [id number] Date of Birth: [birth year]  Beginning of progress report period: January 01, 2021 End of progress report period: January 08, 2021  Patient has met 2 of 4 short term goals.  Pt is making steady progress with bathing/dressing, transfers with/without SB, activity tolerance/endurance, and RUE functional use. Pt completes bathing tasks at shower level with min A for buttocks and CGA at bed level. Pt completes dressing tasks with CGA using lateral leans for pulling pants over hips. SB and scoot transfers with supervision to level surface and CGA on uneven surface. Sit<>stand with Stedy. Pt maintains standing balance using Stedy for approx 2 mins. Pt using RUE as diminished level in functional tasks. Pt's SO has not been present for therapy.   Patient continues to demonstrate the following deficits: muscle weakness, decreased cardiorespiratoy endurance, impaired timing and sequencing and decr sensation and decreased sitting balance, decreased standing balance, decreased postural control and decreased balance strategies and therefore will continue to benefit from skilled OT intervention to enhance overall performance with BADL and Reduce care partner burden.  Patient progressing toward long term goals..  Continue plan of care.  OT Short Term Goals Week 3:  OT Short Term Goal 1 (Week 3): Pt will be able to tolerate standing in [place] stedy for at least 3 minutes to demonstrate improved endurance. OT Short Term Goal 1 - Progress (Week 3): Progressing toward goal OT Short Term Goal 2 (Week 3): Pt will doff/don pants utilizing lateral leans while seated EOB or on BSC with min A OT Short Term Goal 2 - Progress (Week 3): Met OT Short Term Goal 3 (Week 3): Pt will perform DABSC transfer with CGA OT Short Term Goal 3 - Progress (Week 3): Met OT Short Term Goal 4 (Week 3): Pt will perform toilet hygiene with min A OT  Short Term Goal 4 - Progress (Week 3): Progressing toward goal OT Short Term Goal 5 (Week 3): Pt will perform TTB transfer with min A Week 4:  OT Short Term Goal 1 (Week 4): Pt will be able to tolerate standing in [place] stedy for at least 3 minutes to demonstrate improved endurance. OT Short Term Goal 2 (Week 4): Pt will perform toilet hygiene with min A   [name] 01/08/2021, 6:45 AM

## 2021-01-08 NOTE — Progress Notes (Signed)
Patient ID: Jackson Figueroa, male   DOB: 06-24-1961, 60 y.o.   MRN: [id number]  SW faxed disability forms to [place] (p:[phone]/f:[phone]). Pt provided original copy.   Loralee Pacas, MSW, [place] Office: [phone] Cell: [phone] Fax: [phone]

## 2021-01-09 LAB — BASIC METABOLIC PANEL
Anion gap: 9 (ref 5–15)
BUN: 20 mg/dL (ref 6–20)
CO2: 26 mmol/L (ref 22–32)
Calcium: 9.3 mg/dL (ref 8.9–10.3)
Chloride: 105 mmol/L (ref 98–111)
Creatinine, Ser: 1.41 mg/dL — ABNORMAL HIGH (ref 0.61–1.24)
GFR, Estimated: 58 mL/min — ABNORMAL LOW (ref >60)
Glucose, Bld: 121 mg/dL — ABNORMAL HIGH (ref 70–99)
Potassium: 4.1 mmol/L (ref 3.5–5.1)
Sodium: 140 mmol/L (ref 135–145)

## 2021-01-09 MED ORDER — LIDOCAINE 5 % EX OINT
TOPICAL_OINTMENT | Freq: Four times a day (QID) | CUTANEOUS | Status: DC | PRN
  Administered 2021-01-09: 1 via TOPICAL
  Filled 2021-01-09: qty 35.44

## 2021-01-09 NOTE — Progress Notes (Signed)
[name] PHYSICAL MEDICINE & REHABILITATION PROGRESS NOTE   Subjective/Complaints:  Pt reports restless night- peeing frequently- large amounts- up to 900cc.   Hand pain is the same.  Also having pain top of R foot for 2-3 days- ice pack helped some.  Using voltaren gel for knee- asked him to also use for foot.    ROS:   Pt denies SOB, abd pain, CP, N/V/C/D, and vision changes  Objective:   No results found. No results for input(s): WBC, HGB, HCT, PLT in the last 72 hours. Recent Labs    01/09/21 0734  NA 140  K 4.1  CL 105  CO2 26  GLUCOSE 121*  BUN 20  CREATININE 1.41*  CALCIUM 9.3    Intake/Output Summary (Last 24 hours) at 01/09/2021 1058 Last data filed at 01/09/2021 0700 Gross per 24 hour  Intake 436 ml  Output 2650 ml  Net -2214 ml     Pressure Injury 11/18/20 Toe (Comment  which one) Anterior;Right Deep Tissue Pressure Injury - Purple or maroon localized area of discolored intact skin or blood-filled blister due to damage of underlying soft tissue from pressure and/or shear. DTI left  (Active)  11/18/20 2330  Location: Toe (Comment  which one) (LEFT big toe)  Location Orientation: Anterior;Right  Staging: Deep Tissue Pressure Injury - Purple or maroon localized area of discolored intact skin or blood-filled blister due to damage of underlying soft tissue from pressure and/or shear.  Wound Description (Comments): DTI left toe  Present on Admission: Yes (on admit to 40M)    Physical Exam: Vital Signs Blood pressure 94/68, pulse 83, temperature (!) 97.4 F (36.3 C), temperature source Oral, resp. rate 20, height 6' (1.829 m), weight 110 kg, SpO2 95 %. Constitutional: awake, sitting up in bed; appropriate, NAD HEENT: EOMI, oral membranes moist Neck: supple Cardiovascular: RRR Respiratory/Chest: CTA B/L- no W/R/R- good air movement GI/Abdomen:  Soft, NT, ND, (+)BS  Ext: no clubbing, cyanosis, Top of R foot trace swelling,/puffy, but also slightly TTP-  no bruising/skin issues seen Psych: appropriate Skin: Warm and dry.   Sacrum- pictured below--stable- resolved Neuro: Ox3 Right ulnar neuropathy- numbness in lateral 4th/5th digit (more in 5th digit numbness) on R with intrinsic atrophy- exam is consistent- no change Motor: Right upper extremity: Shoulder abduction 4 --4/5, distally 3/5 Left upper extremity: 4/5 proximal distal Bilateral lower extremities: Hip flexion, knee extension 3 -/5, ankle dorsiflexion 4/5     Assessment/Plan: 1. Functional deficits which require 3+ hours per day of interdisciplinary therapy in a comprehensive inpatient rehab setting.  Physiatrist is providing close team supervision and 24 hour management of active medical problems listed below.  Physiatrist and rehab team continue to assess barriers to discharge/monitor patient progress toward functional and medical goals  Care Tool:  Bathing    Body parts bathed by patient: Right arm,Left arm,Chest,Abdomen,Front perineal area,Right upper leg,Left upper leg,Right lower leg,Left lower leg,Face,Buttocks   Body parts bathed by helper: Buttocks     Bathing assist Assist Level: Minimal Assistance - Patient > 75%     Upper Body Dressing/Undressing Upper body dressing   What is the patient wearing?: Pull over shirt    Upper body assist Assist Level: Independent    Lower Body Dressing/Undressing Lower body dressing      What is the patient wearing?: Pants     Lower body assist Assist for lower body dressing: Minimal Assistance - Patient > 75%     Toileting Toileting    Toileting assist  Assist for toileting: Dependent - Patient 0% (using bariatric Stedy) Assistive Device Comment: Urinal   Transfers Chair/bed transfer  Transfers assist  Chair/bed transfer activity did not occur: Safety/medical concerns (due to weakness/fatigue)  Chair/bed transfer assist level: Contact Guard/Touching assist     Locomotion Ambulation   Ambulation  assist   Ambulation activity did not occur: Safety/medical concerns (due to weakness/fatigue)  Assist level: Dependent - Patient 0% Assistive device: Lite Gait Max distance: 80'   Walk 10 feet activity   Assist  Walk 10 feet activity did not occur: Safety/medical concerns (due to weakness/fatigue)  Assist level: Dependent - Patient 0% Assistive device: Lite Gait   Walk 50 feet activity   Assist Walk 50 feet with 2 turns activity did not occur: Safety/medical concerns (due to weakness/fatigue)  Assist level: Dependent - Patient 0% Assistive device: Lite Gait    Walk 150 feet activity   Assist Walk 150 feet activity did not occur: Safety/medical concerns (due to weakness/fatigue)         Walk 10 feet on uneven surface  activity   Assist Walk 10 feet on uneven surfaces activity did not occur: Safety/medical concerns (due to weakness/fatigue)         Wheelchair     Assist Will patient use wheelchair at discharge?: Yes Type of Wheelchair: Manual Wheelchair activity did not occur: Safety/medical concerns (due to weakness/fatigue)  Wheelchair assist level: Supervision/Verbal cueing Max wheelchair distance: 100'    Wheelchair 50 feet with 2 turns activity    Assist    Wheelchair 50 feet with 2 turns activity did not occur: Safety/medical concerns (due to weakness/fatigue)   Assist Level: Supervision/Verbal cueing   Wheelchair 150 feet activity     Assist  Wheelchair 150 feet activity did not occur: Safety/medical concerns (due to weakness/fatigue)   Assist Level: Supervision/Verbal cueing   Blood pressure 94/68, pulse 83, temperature (!) 97.4 F (36.3 C), temperature source Oral, resp. rate 20, height 6' (1.829 m), weight 110 kg, SpO2 95 %.    Medical Problem List and Plan: 1.  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  Antithrombotics: -DVT/anticoagulation:  Pharmaceutical: Lovenox             -antiplatelet therapy: N/A 3. Pain Management: tylenol prn  tramadol 50 mg q6 hours prn  Continue Lyrica  Controlled meds on 2/5  2/11- reinforced I'd really like him to take Ibuprofen 1-2x/day- no more- and prefer 1x/day due to elevated Cr- he voiced understanding  2/14- increased lyrica to 75 mg TID yesterday- but with Cr up to 1.65 cannot increase more- can try Duloxetine 30 mg nightly for nerve pain  2/15- d/w pt- he wanted it increased, but explained can not increase Lyrica right now- explained will take a few days for Duloxetine to help  2/16- Ibuprofen changed to daily prn- will d/w pt again tomorrow most likely cause of kidney fxn getting worse- will see if can stop ibuprofen 4. Mood: LCSW to follow for evaluation and support.              -antipsychotic agents: Seroquel 25 5. Neuropsych: This patient is capable of making decisions on his own behalf. 6. Skin/Wound Care:  DTI with MASD: areas on buttocks improving.  Zinc for moisture barrier as well as pressure relief measures.  2/10- will try to get off specialty bed- looking at backside when not in therapy/in w/c.   2/11-12- off specialty bed- buttocks healed as per pic above    2/14- still on low air loss mattress- will rmeove     2/16- on regular bed that can stretch to his height 7. Fluids/Electrolytes/Nutrition: Monitor I/O. Encourage fluid intake. Continue to monitor lytes with serial checks.  8. Morbid obesity: Educate on importance of diet and weight loss to help promote overall health. Encourage side lying when in bed.  9. Pre-renal azotemia vs new CKD Stage IIIa: Improving off tube feeds--encourage fluid intake.              Creatinine 1.48 on 2/3, labs ordered for Monday  2/7- Cr up to 1.57 again- Im wondering if has a new baseline- because is always there- ~ 1.4-1.5 unless gets IVFs-   2/9- will  recheck in AM and see how doing on Ibuprofen  2/10- Cr 1.51- stable and BUN 26- stable - likely Cr ~ 1.50 is new baseline  2/11- went over this issue with pt- he voiced understanding  2/14- Cr up to 1.65- if doesn't drink more, will have to do IVFs/use PEG again.  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              Hemoglobin 9.5 on 2/3  overall stable- con't regimen 11. Encephalopathy: Has resolved. Continue Seroquel to help with sleep hygeine.  12.  Dysphagia: Resolved  2/2- PEG was placed 12/30- so cannot be removed til minimum 3/1  13. Recent trach: Closed  2/9- has some trach site drainage- appears purulent- will start Keflex 500 mg BID (per CrCl) x 3 days- and see how it goes  2/10- no drainage seen this AM, but will con't to monitor- WBC is better at 5.9k  2/13- stoma closed after silver nitrate, can use bandaid or no dressing  2/14- closed/healed  14.  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  Lyrica 50 mg TID_ explained won't help numbness/weakness, only nerve pain.  2/7- helping nerve pain a lot- con't regimen  2/16- added Duloxetine 30 mg nightly and Lyrica now 75 mg TID- not enough per pt- trying to see if Duloxetine will kick in.   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 Muscle spasms  2/8- will try Skelaxin 800 mg TID prn for muscle spasms- since c/o severe muscle tightness  2/9- didn't complain of muscle spasms today 17. Previous HTN  2/11- refusing BP meds- will stop 18. Insomnia  2/11- stopped Seroquel per pt request 19. Loose stools  2/15- make colace prn  2/17- doing better- more  regular- not loose- con't regimen      LOS: 22 days A FACE TO FACE EVALUATION WAS PERFORMED  Jackson Figueroa 01/09/2021, 10:58 AM

## 2021-01-09 NOTE — Progress Notes (Signed)
Occupational Therapy Session Note  Patient Details  Name: Jackson Figueroa MRN: [id number] Date of Birth: 03-Apr-1961  Today's Date: 01/09/2021 OT Individual Time: 1100-1202 OT Individual Time Calculation (min): 62 min    Short Term Goals: Week 4:  OT Short Term Goal 1 (Week 4): Pt will be able to tolerate standing in [place] stedy for at least 3 minutes to demonstrate improved endurance. OT Short Term Goal 2 (Week 4): Pt will perform toilet hygiene with min A  Skilled Therapeutic Interventions/Progress Updates:    Patient seated edge of bed, sit pivot transfer to/from bed, w/c and mat table with CS/set up.  He is able to manage w/c parts and propel w/c to/from therapy gym.  Completed trunk mobility, posture, general conditioning and core strengthening in unsupported sitting on mat table.  Utilized stedy for quad strengthening activity and standing.  He returned to bed at close of session, call bell and tray table in reach.    Therapy Documentation Precautions:  Precautions Precautions: Fall Precaution Comments: PEG, sacral and toe wounds Restrictions Weight Bearing Restrictions: No   Therapy/Group: Individual Therapy  Carlos Levering 01/09/2021, 7:44 AM

## 2021-01-09 NOTE — Progress Notes (Addendum)
Physical Therapy Session Note  Patient Details  Name: Jackson Figueroa MRN: [id number] Date of Birth: [birth year]  Today's Date: 01/09/2021 PT Individual Time: 0900-1005 PT Individual Time Calculation (min): 65 min   Short Term Goals: Week 3:  PT Short Term Goal 1 (Week 3): Pt will be able to perform sit <> stands with max assist with LRAD PT Short Term Goal 2 (Week 3): Pt will be able to gait with lift equipment x 20' PT Short Term Goal 3 (Week 3): Pt will be able to perform bed <> w/c transfers without use of lift equipment with min/mod assist  Skilled Therapeutic Interventions/Progress Updates: Pt presented at [facility] agreeable to therapy. Pt states R hand continues to bother him, states voltaren applied to knees and rubbed some homeopathtic cream on R hand. Pt then performed squat pivot transfer to w/c with close supervision and demonstrated good clearance of buttocks between bed and w/c. Pt transported to rehab gym for energy conservation and performed squat pivot transfer to high/low mat. Pt performed x 3 STS from 22 in mat with minA and verbal cues for hand placement. Pt then agreeable to participate in ambulation with Lite Gait. Pt set up with Lite Gait with pt facing outward and pt required modA for STS. Pt was able to ambulated  2ft with RW and Lite Gait with no occurences of knee buckling but some knee instability noted with fatigue. After extended rest pt ambulate an additional 174ft with Lite Gait harness slacked (but still able to provide support if knees buckled). Pt required cues with ambulation to increase BOS particularly during turns. Once activity completed pt propelled back to room using BLE for hamstring strengthening. In room pt performed squat pivot to bed with close S, but demonstrating decreased clearance of buttock that at beginning of session. Pt returned to supine at end of session with PTA providing ice packs to B knees. Pt left with bed alarm on, call bell within reach and needs  met.      Therapy Documentation Precautions:  Precautions Precautions: Fall Precaution Comments: PEG, sacral and toe wounds Restrictions Weight Bearing Restrictions: No General:   Vital Signs: Therapy Vitals Temp: 98.3 F (36.8 C) Pulse Rate: 99 Resp: 16 BP: 113/89 Patient Position (if appropriate): Sitting Oxygen Therapy SpO2: 99 % O2 Device: Room Air Pain: Pain Assessment Pain Scale: 0-10 Pain Score: 6  Pain Type: Acute pain Pain Location: Hand Pain Orientation: Right Pain Descriptors / Indicators: Aching Pain Frequency: Intermittent Pain Onset: On-going Patients Stated Pain Goal: 4 Pain Intervention(s): Medication (See eMAR) Mobility:   Locomotion :    Trunk/Postural Assessment :    Balance:   Exercises:   Other Treatments:      Therapy/Group: Individual Therapy  Sharin Altidor 01/09/2021, 4:17 PM

## 2021-01-09 NOTE — Progress Notes (Signed)
Occupational Therapy Session Note  Patient Details  Name: Jackson Figueroa MRN: [id number] Date of Birth: 03-Oct-1961  Today's Date: 01/09/2021 OT Individual Time: 1355-1450 OT Individual Time Calculation (min): 55 min   Short Term Goals: Week 4:  OT Short Term Goal 1 (Week 4): Pt will be able to tolerate standing in [place] stedy for at least 3 minutes to demonstrate improved endurance. OT Short Term Goal 2 (Week 4): Pt will perform toilet hygiene with min A  Skilled Therapeutic Interventions/Progress Updates:    Pt greeted seated EOB and agreeable to OT treatment session. Pt completed lateral scooting transfer from bed to drop arm wc with CGA. Pt propelled wc towards the door, but stated his arms were tired from earlier sessions. OT brought pt down to therapy gym. Pt completed 10 mins x2 on [employer] arm bike on level 2. OT wrapped R hand in Ace wrap to help maintain grip on handle. Pt then completed LB there-ex with 3 sets of 10 hip adduction, hip abduction, and seated hip extension using orange theraband. Pt played pt preferred music during session. Pt returned to room and pivoted back to bed in similar fashion. Pt declined to lay down and was left seated EOB with needs met.   Therapy Documentation Precautions:  Precautions Precautions: Fall Precaution Comments: PEG, sacral and toe wounds Restrictions Weight Bearing Restrictions: No Pain: Pain Assessment Pain Scale: 0-10 Pain Score: 6  Pain Type: Acute pain Pain Location: Hand Pain Orientation: Right Pain Descriptors / Indicators: Aching Pain Frequency: Intermittent Pain Onset: On-going Patients Stated Pain Goal: 4 Pain Intervention(s): Repositioned, rest   Therapy/Group: Individual Therapy  Valma Cava 01/09/2021, 3:04 PM

## 2021-01-10 LAB — BASIC METABOLIC PANEL
Anion gap: 9 (ref 5–15)
BUN: 20 mg/dL (ref 6–20)
CO2: 25 mmol/L (ref 22–32)
Calcium: 9 mg/dL (ref 8.9–10.3)
Chloride: 106 mmol/L (ref 98–111)
Creatinine, Ser: 1.35 mg/dL — ABNORMAL HIGH (ref 0.61–1.24)
GFR, Estimated: >60 mL/min (ref >60)
Glucose, Bld: 89 mg/dL (ref 70–99)
Potassium: 4.3 mmol/L (ref 3.5–5.1)
Sodium: 140 mmol/L (ref 135–145)

## 2021-01-10 MED ORDER — ENOXAPARIN SODIUM 40 MG/0.4ML ~~LOC~~ SOLN
40.0000 mg | SUBCUTANEOUS | Status: DC
  Administered 2021-01-11 – 2021-01-24 (×14): 40 mg via SUBCUTANEOUS
  Filled 2021-01-10 (×14): qty 0.4

## 2021-01-10 NOTE — Progress Notes (Signed)
Occupational Therapy Session Note  Patient Details  Name: Jackson Figueroa MRN: [id number] Date of Birth: [birth year]  Today's Date: 01/10/2021 OT Individual Time: 1300-1355 OT Individual Time Calculation (min): 55 min    Short Term Goals: Week 4:  OT Short Term Goal 1 (Week 4): Pt will be able to tolerate standing in [place] stedy for at least 3 minutes to demonstrate improved endurance. OT Short Term Goal 2 (Week 4): Pt will perform toilet hygiene with min A  Skilled Therapeutic Interventions/Progress Updates:    Pt resting in w/c upon arrival.  Scoot transfer to bed to don long pants. Scoot transfer to w/c. Transfers with supervision. OT intervention with primary focus on w/c mobility in community setting, endurance, safety awareness, and BUE strengthening. Pt propelled w/c to nursing station. Transported to elevators for time management. Pt propelled w/c in [facility] and outside on concrete and uneven surfaces. Pt fatigues quickly and requires rest breaks but able to propel up small incline (~2*). Pt propelled out door into courtyard. Pt returned inside hospital and propelled on even flooring 2X50' approx. Pt returned to room and transferred back to bed. Pt remained in bed with all needs within reach. RN present.   Therapy Documentation Precautions:  Precautions Precautions: Fall Precaution Comments: PEG, sacral and toe wounds Restrictions Weight Bearing Restrictions: No Pain:  Pt c/o Rt shoulder pain at end of session; repositioned and rest   Therapy/Group: Individual Therapy  [name] 01/10/2021, 2:37 PM

## 2021-01-10 NOTE — Progress Notes (Signed)
[place] PHYSICAL MEDICINE & REHABILITATION PROGRESS NOTE   Subjective/Complaints:  Pt reports slept better-  R hand was doing great this AM- but back to hurting again this AM Lidocaine cream he's not sure if was helping- "didn't help at least right away".  R foot- feels like needs icepack.   Cr 1.35 today- getting better  ROS:   Pt denies SOB, abd pain, CP, N/V/C/D, and vision changes   Objective:   No results found. No results for input(s): WBC, HGB, HCT, PLT in the last 72 hours. Recent Labs    01/09/21 0734 01/10/21 0525  NA 140 140  K 4.1 4.3  CL 105 106  CO2 26 25  GLUCOSE 121* 89  BUN 20 20  CREATININE 1.41* 1.35*  CALCIUM 9.3 9.0    Intake/Output Summary (Last 24 hours) at 01/10/2021 0854 Last data filed at 01/10/2021 0132 Gross per 24 hour  Intake 400 ml  Output 2550 ml  Net -2150 ml     Pressure Injury 11/18/20 Toe (Comment  which one) Anterior;Right Deep Tissue Pressure Injury - Purple or maroon localized area of discolored intact skin or blood-filled blister due to damage of underlying soft tissue from pressure and/or shear. DTI left  (Active)  11/18/20 2330  Location: Toe (Comment  which one) (LEFT big toe)  Location Orientation: Anterior;Right  Staging: Deep Tissue Pressure Injury - Purple or maroon localized area of discolored intact skin or blood-filled blister due to damage of underlying soft tissue from pressure and/or shear.  Wound Description (Comments): DTI left toe  Present on Admission: Yes (on admit to 13M)    Physical Exam: Vital Signs Blood pressure 125/84, pulse 86, temperature 98 F (36.7 C), resp. rate 18, height 6' (1.829 m), weight 110 kg, SpO2 94 %. Constitutional: awake, alert, appropriate, sitting up in bed on computer, NAD HEENT: EOMI, oral membranes moist Neck: supple Cardiovascular: RRR- no JVD Respiratory/Chest: CTA B/L- no W/R/R- good air movement GI/Abdomen:  Soft, NT, ND, (+)BS  Ext: no clubbing, cyanosis, Top of  R foot puffy- TTP Psych: appropriate- no change Skin: Warm and dry.   Sacrum- pictured below--stable- resolved Neuro: Ox3 Right ulnar neuropathy- numbness in lateral 4th/5th digit (more in 5th digit numbness) on R with intrinsic atrophy- exam is consistent- no change Motor: Right upper extremity: Shoulder abduction 4 --4/5, distally 3/5 Left upper extremity: 4/5 proximal distal Bilateral lower extremities: Hip flexion, knee extension 3 -/5, ankle dorsiflexion 4/5     Assessment/Plan: 1. Functional deficits which require 3+ hours per day of interdisciplinary therapy in a comprehensive inpatient rehab setting.  Physiatrist is providing close team supervision and 24 hour management of active medical problems listed below.  Physiatrist and rehab team continue to assess barriers to discharge/monitor patient progress toward functional and medical goals  Care Tool:  Bathing    Body parts bathed by patient: Right arm,Left arm,Chest,Abdomen,Front perineal area,Right upper leg,Left upper leg,Right lower leg,Left lower leg,Face,Buttocks   Body parts bathed by helper: Buttocks     Bathing assist Assist Level: Minimal Assistance - Patient > 75%     Upper Body Dressing/Undressing Upper body dressing   What is the patient wearing?: Pull over shirt    Upper body assist Assist Level: Independent    Lower Body Dressing/Undressing Lower body dressing      What is the patient wearing?: Pants     Lower body assist Assist for lower body dressing: Minimal Assistance - Patient > 75%     Toileting Toileting  Toileting assist Assist for toileting: Dependent - Patient 0% (using bariatric Stedy) Assistive Device Comment: Urinal   Transfers Chair/bed transfer  Transfers assist  Chair/bed transfer activity did not occur: Safety/medical concerns (due to weakness/fatigue)  Chair/bed transfer assist level: Contact Guard/Touching assist     Locomotion Ambulation   Ambulation  assist   Ambulation activity did not occur: Safety/medical concerns (due to weakness/fatigue)  Assist level: Dependent - Patient 0% Assistive device: Lite Gait Max distance: 80'   Walk 10 feet activity   Assist  Walk 10 feet activity did not occur: Safety/medical concerns (due to weakness/fatigue)  Assist level: Dependent - Patient 0% Assistive device: Lite Gait   Walk 50 feet activity   Assist Walk 50 feet with 2 turns activity did not occur: Safety/medical concerns (due to weakness/fatigue)  Assist level: Dependent - Patient 0% Assistive device: Lite Gait    Walk 150 feet activity   Assist Walk 150 feet activity did not occur: Safety/medical concerns (due to weakness/fatigue)         Walk 10 feet on uneven surface  activity   Assist Walk 10 feet on uneven surfaces activity did not occur: Safety/medical concerns (due to weakness/fatigue)         Wheelchair     Assist Will patient use wheelchair at discharge?: Yes Type of Wheelchair: Manual Wheelchair activity did not occur: Safety/medical concerns (due to weakness/fatigue)  Wheelchair assist level: Supervision/Verbal cueing Max wheelchair distance: 100'    Wheelchair 50 feet with 2 turns activity    Assist    Wheelchair 50 feet with 2 turns activity did not occur: Safety/medical concerns (due to weakness/fatigue)   Assist Level: Supervision/Verbal cueing   Wheelchair 150 feet activity     Assist  Wheelchair 150 feet activity did not occur: Safety/medical concerns (due to weakness/fatigue)   Assist Level: Supervision/Verbal cueing   Blood pressure 125/84, pulse 86, temperature 98 F (36.7 C), resp. rate 18, height 6' (1.829 m), weight 110 kg, SpO2 94 %.    Medical Problem List and Plan: 1.  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   Antithrombotics: -DVT/anticoagulation:  Pharmaceutical: Lovenox             -antiplatelet therapy: N/A 3. Pain Management: tylenol prn  tramadol 50 mg q6 hours prn  Continue Lyrica  Controlled meds on 2/5  2/11- reinforced I'd really like him to take Ibuprofen 1-2x/day- no more- and prefer 1x/day due to elevated Cr- he voiced understanding  2/14- increased lyrica to 75 mg TID yesterday- but with Cr up to 1.65 cannot increase more- can try Duloxetine 30 mg nightly for nerve pain  2/15- d/w pt- he wanted it increased, but explained can not increase Lyrica right now- explained will take a few days for Duloxetine to help  2/16- Ibuprofen changed to daily prn- will d/w pt again tomorrow most likely cause of kidney fxn getting worse- will see if can stop ibuprofen  2/18- changed to 1x/day Ibuprofen- Cr better to 1.35 4. Mood: LCSW to follow for evaluation and support.              -antipsychotic agents: Seroquel 25 5. Neuropsych: This patient is capable of making decisions on his own behalf. 6. Skin/Wound Care:  DTI with MASD: areas on buttocks improving.  Zinc for moisture barrier as well as pressure relief measures.  2/10- will try to get off specialty bed- looking at backside when not in therapy/in w/c.   2/11-12- off specialty bed- buttocks healed as per pic above    2/14- still on low air loss mattress- will rmeove     2/16- on regular bed that can stretch to his height 7. Fluids/Electrolytes/Nutrition: Monitor I/O. Encourage fluid intake. Continue to monitor lytes with serial checks.  8. Morbid obesity: Educate on importance of diet and weight loss to help promote overall health. Encourage side lying when in bed.  9. Pre-renal azotemia vs new CKD Stage IIIa: Improving off tube feeds--encourage fluid intake.   2/16- labs tomorrow- Cr issues likely due to Ibuprofen  2/17- Cr down to 1.41- and BUN 20- much improved- con't regimen for now  2/18- Cr down to 1.35-  better 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              Hemoglobin 9.5 on 2/3  overall stable- con't regimen 11. Encephalopathy: Has resolved. Continue Seroquel to help with sleep hygeine.  12.  Dysphagia: Resolved  2/2- PEG was placed 12/30- so cannot be removed til minimum 3/1  13. Recent trach: Closed  2/9- has some trach site drainage- appears purulent- will start Keflex 500 mg BID (per CrCl) x 3 days- and see how it goes  2/10- no drainage seen this AM, but will con't to monitor- WBC is better at 5.9k  2/13- stoma closed after silver nitrate, can use bandaid or no dressing  2/14- closed/healed  14.  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  Lyrica 50 mg TID_ explained won't help numbness/weakness, only nerve pain.  2/7- helping nerve pain a lot- con't regimen  2/16- added Duloxetine 30 mg nightly and Lyrica now 75 mg TID- not enough per pt- trying to see if Duloxetine will kick in.   2/17- add Lidocaine cream QID prn for R hand  2/18- no improvement with voltaren or lidocaine- with Cr going down, Monday can increase Duloxetine to 60 mg daily 15. Long COVID  Covid vaccine received on 2/4  2/7- can get another vaccine- the second shot next Friday.-   2/9- sounds like can get 2/18  2/18- COVID vaccine today  16. Muscle spasms  2/8- will try Skelaxin 800 mg TID prn for muscle spasms- since c/o severe muscle tightness  2/9- didn't complain of muscle spasms today 17. Previous HTN  2/11- refusing BP meds- will stop 18. Insomnia  2/11- stopped Seroquel per pt request 19. Loose stools  2/15- make colace prn  2/17- doing better- more regular- not loose- con't regimen      LOS: 23 days A FACE TO FACE EVALUATION WAS PERFORMED  Jaquasia Doscher 01/10/2021, 8:54 AM

## 2021-01-10 NOTE — Progress Notes (Signed)
Physical Therapy Session Note  Patient Details  Name: Jackson Figueroa MRN: [id number] Date of Birth: [birth year]  Today's Date: 01/10/2021 PT Individual Time: 0805-0900 and 1000-1055 PT Individual Time Calculation (min): 55 min and 55 min  Short Term Goals: Week 3:  PT Short Term Goal 1 (Week 3): Pt will be able to perform sit <> stands with max assist with LRAD PT Short Term Goal 2 (Week 3): Pt will be able to gait with lift equipment x 20' PT Short Term Goal 3 (Week 3): Pt will be able to perform bed <> w/c transfers without use of lift equipment with min/mod assist Week 4:     Skilled Therapeutic Interventions/Progress Updates: Pt presented at [facility] agreeable to therapy. Pt c/o generalized pain in knees and R hand. Nsg aware but unable to receive additional pain meds at this time. Performed squat pivot transfer to w/c with CGA and increased effort. Noted that pt did not clear buttocks completely as compared to yesterday. Pt propelled to nsg station and PTA transported remaining distance to rehab gym. Performed squat pivot transfer to high/low mat CGA and increased effort. Transferred to supine and pt participated in supine/seated therex as noted all exercises performed 2 x 10 bilaterally: Heel slides SAQ SLR Bridge with hip abd level 2 resistance band Sidelying clamshells level 1 resistance band Sidelying hip abd Seated hamstring pulls level 4 resistance band LAQ with 5sec hold   Tx2: Pt presented at [facility] agreeable to therapy. Pt states R knee continues to have increased pain. Performed squat pivot to w/c with CGA and some improved clearance of buttocks from mattress. Pt propelled to dayroom with supervision and intermittent rest breaks due to increased R hand pain. Pt participated in Cybex Kinetron at 50cm/sec for 3 x 2 min bouts for BLE strengthening and general conditioning. Pt required extended rest breaks between bouts due to fatigue. Pt then transported to rehab gym and performed squat pivot  to mat CGA. Pt performed STS x 3 from 23in height requiring minA and pt with decreased tolerance bearing weight through RLE with pain increasing to 8/10. Once completed to returned to w/c in same manner as prior and transported back to room. Pt remained in w/c at end of session with ice pack placed on R knee and current needs met.       Therapy Documentation Precautions:  Precautions Precautions: Fall Precaution Comments: PEG, sacral and toe wounds Restrictions Weight Bearing Restrictions: No General:   Vital Signs: Therapy Vitals Temp: 98 F (36.7 C) Pulse Rate: 96 Resp: 17 BP: 111/65 Patient Position (if appropriate): Lying Oxygen Therapy SpO2: 96 % O2 Device: Room Air Pain:   Mobility:   Locomotion :    Trunk/Postural Assessment :    Balance:   Exercises:   Other Treatments:      Therapy/Group: Individual Therapy  Antonetta Clanton 01/10/2021, 3:56 PM

## 2021-01-10 NOTE — Progress Notes (Signed)
Physical Therapy Weekly Progress Note  Patient Details  Name: Jackson Figueroa MRN: [id number] Date of Birth: 21-May-1961  Beginning of progress report period: January 03, 2021 End of progress report period: January 10, 2021  Today's Date: 01/10/2021   Patient has met 3 of 3 short term goals.  Pt has made excellent gains during current course of therapy. Pt is performing lateral scoots of squat pivot transfers at consistent CGA at time supervision as varies per fatigue level. During current week pt has made progression in Lite Gait in ambulation with one day ambulating ~177ft with Lite Gait harness slackened but consistently able to ambulate under 123ft in Lite Gait before knees buckle. Pt continues to demonstrate significant weakness in BLE limiting trials of ambulation without harness support but has been working on sit to stands from levels 21-23 inches with consistent minA for BLE strengthening.   Patient continues to demonstrate the following deficits muscle weakness and muscle joint tightness and decreased cardiorespiratoy endurance and therefore will continue to benefit from skilled PT intervention to increase functional independence with mobility.  Patient not progressing toward long term goals.  See goal revision..  Plan of care revisions: downgraded gait goal to min A level, d/c stair goal due to slow progress.  PT Short Term Goals Week 3:  PT Short Term Goal 1 (Week 3): Pt will be able to perform sit <> stands with max assist with LRAD PT Short Term Goal 1 - Progress (Week 3): Met PT Short Term Goal 2 (Week 3): Pt will be able to gait with lift equipment x 20' PT Short Term Goal 2 - Progress (Week 3): Met PT Short Term Goal 3 (Week 3): Pt will be able to perform bed <> w/c transfers without use of lift equipment with min/mod assist PT Short Term Goal 3 - Progress (Week 3): Met Week 4:   =LTG due to ELOS    Therapy Documentation Precautions:  Precautions Precautions:  Fall Precaution Comments: PEG, sacral and toe wounds Restrictions Weight Bearing Restrictions: No   Therapy/Group: Individual Therapy  Rosita DeChalus  Excell Seltzer, PT, DPT 01/10/2021, 8:31 AM

## 2021-01-10 NOTE — Progress Notes (Signed)
Occupational Therapy Session Note  Patient Details  Name: Jackson Figueroa MRN: [id number] Date of Birth: 09/16/1961  Today's Date: 01/10/2021 OT Individual Time: 1130-1200 OT Individual Time Calculation (min): 30 min    Short Term Goals: Week 4:  OT Short Term Goal 1 (Week 4): Pt will be able to tolerate standing in [place] stedy for at least 3 minutes to demonstrate improved endurance. OT Short Term Goal 2 (Week 4): Pt will perform toilet hygiene with min A  Skilled Therapeutic Interventions/Progress Updates:    Pt resting in w/c upon arrival with laptop on table. Initial discussion re: use of keyboard. Pt commented that he is making some adjustments and he will practice on typicla desktop keyboard at home vs [employer] MacBook. Pt engaged in RUE tasks/activities with super soft theraputty. Pt issued soft theraputty and performed same tasks. Pt commented that he is able to tell difference in resistance. Pt also issued med soft putty with instructions to "graduate" when soft putty is "too easy." Pt remained in w/c with all needs within reach.   Therapy Documentation Precautions:  Precautions Precautions: Fall Precaution Comments: PEG, sacral and toe wounds Restrictions Weight Bearing Restrictions: No   Pain: Pt c/o increased RUE (ulnar nerve) pain with use; MD aware (pt has heating pad he has been using to relieve pain)      Therapy/Group: Individual Therapy  [name] 01/10/2021, 12:08 PM

## 2021-01-10 NOTE — Plan of Care (Signed)
  Problem: RH SKIN INTEGRITY Goal: RH STG SKIN FREE OF INFECTION/BREAKDOWN Description: Skin free of breakdown and infection with min assist to supervision assistance. Outcome: Progressing Goal: RH STG ABLE TO PERFORM INCISION/WOUND CARE W/ASSISTANCE Description: STG Able To Perform Incision/Wound Care With min Assistance to supervision assistance. Outcome: Progressing   Problem: RH KNOWLEDGE DEFICIT GENERAL Goal: RH STG INCREASE KNOWLEDGE OF SELF CARE AFTER HOSPITALIZATION Outcome: Progressing   Problem: Consults Goal: RH GENERAL PATIENT EDUCATION Description: See Patient Education module for education specifics. Outcome: Progressing Goal: Skin Care Protocol Initiated - if Braden Score 18 or less Description: If consults are not indicated, leave blank or document N/A Outcome: Progressing

## 2021-01-11 MED ORDER — DULOXETINE HCL 20 MG PO CPEP
40.0000 mg | ORAL_CAPSULE | Freq: Every day | ORAL | Status: DC
  Administered 2021-01-11 – 2021-01-13 (×3): 40 mg via ORAL
  Filled 2021-01-11 (×3): qty 2

## 2021-01-11 NOTE — Plan of Care (Signed)
  Problem: RH SKIN INTEGRITY Goal: RH STG SKIN FREE OF INFECTION/BREAKDOWN Description: Skin free of breakdown and infection with min assist to supervision assistance. Outcome: Progressing Goal: RH STG ABLE TO PERFORM INCISION/WOUND CARE W/ASSISTANCE Description: STG Able To Perform Incision/Wound Care With min Assistance to supervision assistance. Outcome: Progressing   Problem: Consults Goal: RH GENERAL PATIENT EDUCATION Description: See Patient Education module for education specifics. Outcome: Progressing Goal: Skin Care Protocol Initiated - if Braden Score 18 or less Description: If consults are not indicated, leave blank or document N/A Outcome: Progressing

## 2021-01-11 NOTE — Progress Notes (Signed)
Flushed PEG w/ 100 ml sterile water and changed dressing surrounding tube w/ split guaze.

## 2021-01-11 NOTE — Progress Notes (Signed)
[facility] PHYSICAL MEDICINE & REHABILITATION PROGRESS NOTE   Subjective/Complaints: Says he is doing alright this morning. He does complain of ulnar nerve pain despite Lyrica and Cymbalta. Discussed increasing Cymbalta dose to 40mg  and he is agreeable.  ROS:   Pt denies SOB, abd pain, CP, N/V/C/D, and vision changes, +right ulnar nerve pain   Objective:   No results found. No results for input(s): WBC, HGB, HCT, PLT in the last 72 hours. Recent Labs    01/09/21 0734 01/10/21 0525  NA 140 140  K 4.1 4.3  CL 105 106  CO2 26 25  GLUCOSE 121* 89  BUN 20 20  CREATININE 1.41* 1.35*  CALCIUM 9.3 9.0    Intake/Output Summary (Last 24 hours) at 01/11/2021 1046 Last data filed at 01/11/2021 0732 Gross per 24 hour  Intake 600 ml  Output 650 ml  Net -50 ml     Pressure Injury 11/18/20 Toe (Comment  which one) Anterior;Right Deep Tissue Pressure Injury - Purple or maroon localized area of discolored intact skin or blood-filled blister due to damage of underlying soft tissue from pressure and/or shear. DTI left  (Active)  11/18/20 2330  Location: Toe (Comment  which one) (LEFT big toe)  Location Orientation: Anterior;Right  Staging: Deep Tissue Pressure Injury - Purple or maroon localized area of discolored intact skin or blood-filled blister due to damage of underlying soft tissue from pressure and/or shear.  Wound Description (Comments): DTI left toe  Present on Admission: Yes (on admit to 86M)    Physical Exam: Vital Signs Blood pressure (!) 101/59, pulse 77, temperature 97.8 F (36.6 C), resp. rate 18, height 6' (1.829 m), weight 110 kg, SpO2 95 %. Gen: no distress, normal appearing HEENT: oral mucosa pink and moist, NCAT Cardio: Reg rate Chest: normal effort, normal rate of breathing Abd: soft, non-distended Musculoskeletal: Ext: no clubbing, cyanosis, Top of R foot puffy- TTP Psych: appropriate- no change Skin: Warm and dry.   Sacrum- pictured below--stable-  resolved Neuro: Ox3 Right ulnar neuropathy- numbness in lateral 4th/5th digit (more in 5th digit numbness) on R with intrinsic atrophy- exam is consistent- no change Motor: Right upper extremity: Shoulder abduction 4 --4/5, distally 3/5 Left upper extremity: 4/5 proximal distal Bilateral lower extremities: Hip flexion, knee extension 3 -/5, ankle dorsiflexion 4/5     Assessment/Plan: 1. Functional deficits which require 3+ hours per day of interdisciplinary therapy in a comprehensive inpatient rehab setting.  Physiatrist is providing close team supervision and 24 hour management of active medical problems listed below.  Physiatrist and rehab team continue to assess barriers to discharge/monitor patient progress toward functional and medical goals  Care Tool:  Bathing    Body parts bathed by patient: Right arm,Left arm,Chest,Abdomen,Front perineal area,Right upper leg,Left upper leg,Right lower leg,Left lower leg,Face,Buttocks   Body parts bathed by helper: Buttocks     Bathing assist Assist Level: Minimal Assistance - Patient > 75%     Upper Body Dressing/Undressing Upper body dressing   What is the patient wearing?: Pull over shirt    Upper body assist Assist Level: Independent    Lower Body Dressing/Undressing Lower body dressing      What is the patient wearing?: Pants     Lower body assist Assist for lower body dressing: Minimal Assistance - Patient > 75%     Toileting Toileting    Toileting assist Assist for toileting: Dependent - Patient 0% (using bariatric Stedy) Assistive Device Comment: Urinal   Transfers Chair/bed transfer  Transfers  assist  Chair/bed transfer activity did not occur: Safety/medical concerns (due to weakness/fatigue)  Chair/bed transfer assist level: Contact Guard/Touching assist     Locomotion Ambulation   Ambulation assist   Ambulation activity did not occur: Safety/medical concerns (due to weakness/fatigue)  Assist  level: Dependent - Patient 0% Assistive device: Lite Gait Max distance: 80'   Walk 10 feet activity   Assist  Walk 10 feet activity did not occur: Safety/medical concerns (due to weakness/fatigue)  Assist level: Dependent - Patient 0% Assistive device: Lite Gait   Walk 50 feet activity   Assist Walk 50 feet with 2 turns activity did not occur: Safety/medical concerns (due to weakness/fatigue)  Assist level: Dependent - Patient 0% Assistive device: Lite Gait    Walk 150 feet activity   Assist Walk 150 feet activity did not occur: Safety/medical concerns (due to weakness/fatigue)         Walk 10 feet on uneven surface  activity   Assist Walk 10 feet on uneven surfaces activity did not occur: Safety/medical concerns (due to weakness/fatigue)         Wheelchair     Assist Will patient use wheelchair at discharge?: Yes Type of Wheelchair: Manual Wheelchair activity did not occur: Safety/medical concerns (due to weakness/fatigue)  Wheelchair assist level: Supervision/Verbal cueing Max wheelchair distance: 100'    Wheelchair 50 feet with 2 turns activity    Assist    Wheelchair 50 feet with 2 turns activity did not occur: Safety/medical concerns (due to weakness/fatigue)   Assist Level: Supervision/Verbal cueing   Wheelchair 150 feet activity     Assist  Wheelchair 150 feet activity did not occur: Safety/medical concerns (due to weakness/fatigue)   Assist Level: Supervision/Verbal cueing   Blood pressure (!) 101/59, pulse 77, temperature 97.8 F (36.6 C), resp. rate 18, height 6' (1.829 m), weight 110 kg, SpO2 95 %.    Medical Problem List and Plan: 1.  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  Antithrombotics: -DVT/anticoagulation:  Pharmaceutical: Lovenox             -antiplatelet therapy: N/A 3.  Pain Management: tylenol prn  tramadol 50 mg q6 hours prn  Continue Lyrica  Controlled meds on 2/5  2/11- reinforced I'd really like him to take Ibuprofen 1-2x/day- no more- and prefer 1x/day due to elevated Cr- he voiced understanding  2/14- increased lyrica to 75 mg TID yesterday- but with Cr up to 1.65 cannot increase more- can try Duloxetine 30 mg nightly for nerve pain  2/15- d/w pt- he wanted it increased, but explained can not increase Lyrica right now- explained will take a few days for Duloxetine to help  2/16- Ibuprofen changed to daily prn- will d/w pt again tomorrow most likely cause of kidney fxn getting worse- will see if can stop ibuprofen  2/18- changed to 1x/day Ibuprofen- Cr better to 1.35  2/19: c/o ulnar nerve pain: increase Cymbalta to 40mg .  4. Mood: LCSW to follow for evaluation and support.              -antipsychotic agents: Seroquel 25 5. Neuropsych: This patient is capable of making decisions on his own behalf. 6. Skin/Wound Care:                          DTI  with MASD: areas on buttocks improving.  Zinc for moisture barrier as well as pressure relief measures.  2/10- will try to get off specialty bed- looking at backside when not in therapy/in w/c.   2/11-12- off specialty bed- buttocks healed as per pic above    2/14- still on low air loss mattress- will rmeove     2/16- on regular bed that can stretch to his height 7. Fluids/Electrolytes/Nutrition: Monitor I/O. Encourage fluid intake. Continue to monitor lytes with serial checks.  8. Morbid obesity: Educate on importance of diet and weight loss to help promote overall health. Encourage side lying when in bed.  9. Pre-renal azotemia vs new CKD Stage IIIa: Improving off tube feeds--encourage fluid intake.   2/16- labs tomorrow- Cr issues likely due to Ibuprofen  2/17- Cr down to 1.41- and BUN 20- much improved- con't regimen for now  2/18- Cr down to 1.35- better 10. Anemia of critical illness: Will monitor H/H  with serial checks.              Hemoglobin 9.5 on 2/3  overall stable- con't regimen 11. Encephalopathy: Has resolved. Continue Seroquel to help with sleep hygeine.  12.  Dysphagia: Resolved  2/2- PEG was placed 12/30- so cannot be removed til minimum 3/1  13. Recent trach: Closed  2/9- has some trach site drainage- appears purulent- will start Keflex 500 mg BID (per CrCl) x 3 days- and see how it goes  2/10- no drainage seen this AM, but will con't to monitor- WBC is better at 5.9k  2/13- stoma closed after silver nitrate, can use bandaid or no dressing  2/14- closed/healed  14.  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  Lyrica 50 mg TID_ explained won't help numbness/weakness, only nerve pain.  2/7- helping nerve pain a lot- con't regimen  2/16- added Duloxetine 30 mg nightly and Lyrica now 75 mg TID- not enough per pt- trying to see if Duloxetine will kick in.   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 Muscle spasms  2/8- will try Skelaxin 800 mg TID prn for muscle spasms- since c/o severe muscle tightness  2/9- didn't complain of muscle spasms today 17. Previous HTN  2/11- refusing BP meds- will stop  2/19: hypotensive- continue to monitor.  18. Insomnia  2/11- stopped Seroquel per pt request 19. Loose stools  2/15- make colace prn  2/17- doing better- more regular- not loose- con't regimen      LOS: 24 days A FACE TO FACE EVALUATION WAS PERFORMED  Martha Clan P Obie Kallenbach 01/11/2021, 10:46 AM

## 2021-01-11 NOTE — Progress Notes (Signed)
Occupational Therapy Session Note  Patient Details  Name: Jackson Figueroa MRN: [id number] Date of Birth: August 12, 1961  Today's Date: 01/11/2021 OT Group Time:  - 60 minutes missed    Skilled Therapeutic Interventions/Progress Updates:    Pt declined participation in group due to pain in shoulder and knees. Per pt, RN already aware. 60 minutes missed.    Therapy Documentation Precautions:  Precautions Precautions: Fall Precaution Comments: PEG, sacral and toe wounds Restrictions Weight Bearing Restrictions: No Vital Signs: Therapy Vitals Temp: 97.8 F (36.6 C) Pulse Rate: 77 Resp: 18 BP: (!) 101/59 Patient Position (if appropriate): Lying Oxygen Therapy SpO2: 95 % O2 Device: Room Air ADL: ADL Eating: Set up Grooming: Setup Where Assessed-Grooming: Edge of bed Upper Body Bathing: Setup Where Assessed-Upper Body Bathing: Edge of bed Lower Body Bathing: Maximal assistance Where Assessed-Lower Body Bathing: Bed level Upper Body Dressing: Minimal assistance Where Assessed-Upper Body Dressing: Edge of bed Lower Body Dressing: Maximal assistance Where Assessed-Lower Body Dressing: Bed level :     Therapy/Group: Group Therapy  [name] [name] 01/11/2021, 7:16 AM

## 2021-01-12 NOTE — Progress Notes (Signed)
Occupational Therapy Session Note  Patient Details  Name: Jackson Figueroa MRN: [id number] Date of Birth: 22-Mar-1961  Today's Date: 01/12/2021 OT Individual Time: 1020-1100 OT Individual Time Calculation (min): 40 min    Short Term Goals: Week 4:  OT Short Term Goal 1 (Week 4): Pt will be able to tolerate standing in [place] stedy for at least 3 minutes to demonstrate improved endurance. OT Short Term Goal 2 (Week 4): Pt will perform toilet hygiene with min A  Skilled Therapeutic Interventions/Progress Updates:    Pt received supine with no c/o pain, agreeable to OT session. Pt completed bed mobility with supervision to EOB. CGA for squat pivot transfer to the w/c. Updated safety plan to reflect ability to complete squat pivot transfers with nursing staff instead of stedy. Pt completed oral care and grooming tasks at the sink with setup assist. Urgent need for toileting. Min A squat pivot to elevated BSC over toilet. Min cueing for technique to doff shorts with lateral leans. Pt voided 750 cc of urine. Sit > stand from [facility] with bariatric RW with min A. He was able to intermittently remove BUE from RW to pull up pants in standing. He then completed a stand pivot transfer with min A to the w/c. He was left sitting up in the w/c with all needs met.   Therapy Documentation Precautions:  Precautions Precautions: Fall Precaution Comments: PEG, sacral and toe wounds Restrictions Weight Bearing Restrictions: No   Therapy/Group: Individual Therapy  Curtis Sites 01/12/2021, 6:32 AM

## 2021-01-12 NOTE — Progress Notes (Signed)
[facility] PHYSICAL MEDICINE & REHABILITATION PROGRESS NOTE   Subjective/Complaints: Feels ulnar nerve pain is better with increase in Cymbalta He has no other complaints  ROS:   Pt denies SOB, abd pain, CP, N/V/C/D, and vision changes, +right ulnar nerve pain   Objective:   No results found. No results for input(s): WBC, HGB, HCT, PLT in the last 72 hours. Recent Labs    01/10/21 0525  NA 140  K 4.3  CL 106  CO2 25  GLUCOSE 89  BUN 20  CREATININE 1.35*  CALCIUM 9.0    Intake/Output Summary (Last 24 hours) at 01/12/2021 1039 Last data filed at 01/12/2021 0848 Gross per 24 hour  Intake 814 ml  Output 3750 ml  Net -2936 ml     Pressure Injury 11/18/20 Toe (Comment  which one) Anterior;Right Deep Tissue Pressure Injury - Purple or maroon localized area of discolored intact skin or blood-filled blister due to damage of underlying soft tissue from pressure and/or shear. DTI left  (Active)  11/18/20 2330  Location: Toe (Comment  which one) (LEFT big toe)  Location Orientation: Anterior;Right  Staging: Deep Tissue Pressure Injury - Purple or maroon localized area of discolored intact skin or blood-filled blister due to damage of underlying soft tissue from pressure and/or shear.  Wound Description (Comments): DTI left toe  Present on Admission: Yes (on admit to 85M)    Physical Exam: Vital Signs Blood pressure 117/87, pulse 91, temperature 98 F (36.7 C), resp. rate 17, height 6' (1.829 m), weight 110 kg, SpO2 93 %. Gen: no distress, normal appearing HEENT: oral mucosa pink and moist, NCAT Cardio: Reg rate Chest: normal effort, normal rate of breathing Abd: soft, non-distended Ext: no edema Psych: pleasant, normal affect Ext: no clubbing, cyanosis, Top of R foot puffy- TTP Psych: appropriate- no change Skin: Warm and dry.   Sacrum- pictured below--stable- resolved Neuro: Ox3 Right ulnar neuropathy- numbness in lateral 4th/5th digit (more in 5th digit numbness)  on R with intrinsic atrophy- exam is consistent- no change Motor: Right upper extremity: Shoulder abduction 4 --4/5, distally 3/5 Left upper extremity: 4/5 proximal distal Bilateral lower extremities: Hip flexion, knee extension 3 -/5, ankle dorsiflexion 4/5     Assessment/Plan: 1. Functional deficits which require 3+ hours per day of interdisciplinary therapy in a comprehensive inpatient rehab setting.  Physiatrist is providing close team supervision and 24 hour management of active medical problems listed below.  Physiatrist and rehab team continue to assess barriers to discharge/monitor patient progress toward functional and medical goals  Care Tool:  Bathing    Body parts bathed by patient: Right arm,Left arm,Chest,Abdomen,Front perineal area,Right upper leg,Left upper leg,Right lower leg,Left lower leg,Face,Buttocks   Body parts bathed by helper: Buttocks     Bathing assist Assist Level: Minimal Assistance - Patient > 75%     Upper Body Dressing/Undressing Upper body dressing   What is the patient wearing?: Pull over shirt    Upper body assist Assist Level: Independent    Lower Body Dressing/Undressing Lower body dressing      What is the patient wearing?: Pants     Lower body assist Assist for lower body dressing: Minimal Assistance - Patient > 75%     Toileting Toileting    Toileting assist Assist for toileting: Dependent - Patient 0% (using bariatric Stedy) Assistive Device Comment: Urinal   Transfers Chair/bed transfer  Transfers assist  Chair/bed transfer activity did not occur: Safety/medical concerns (due to weakness/fatigue)  Chair/bed transfer assist level:  Contact Guard/Touching assist     Locomotion Ambulation   Ambulation assist   Ambulation activity did not occur: Safety/medical concerns (due to weakness/fatigue)  Assist level: Dependent - Patient 0% Assistive device: Lite Gait Max distance: 80'   Walk 10 feet  activity   Assist  Walk 10 feet activity did not occur: Safety/medical concerns (due to weakness/fatigue)  Assist level: Dependent - Patient 0% Assistive device: Lite Gait   Walk 50 feet activity   Assist Walk 50 feet with 2 turns activity did not occur: Safety/medical concerns (due to weakness/fatigue)  Assist level: Dependent - Patient 0% Assistive device: Lite Gait    Walk 150 feet activity   Assist Walk 150 feet activity did not occur: Safety/medical concerns (due to weakness/fatigue)         Walk 10 feet on uneven surface  activity   Assist Walk 10 feet on uneven surfaces activity did not occur: Safety/medical concerns (due to weakness/fatigue)         Wheelchair     Assist Will patient use wheelchair at discharge?: Yes Type of Wheelchair: Manual Wheelchair activity did not occur: Safety/medical concerns (due to weakness/fatigue)  Wheelchair assist level: Supervision/Verbal cueing Max wheelchair distance: 100'    Wheelchair 50 feet with 2 turns activity    Assist    Wheelchair 50 feet with 2 turns activity did not occur: Safety/medical concerns (due to weakness/fatigue)   Assist Level: Supervision/Verbal cueing   Wheelchair 150 feet activity     Assist  Wheelchair 150 feet activity did not occur: Safety/medical concerns (due to weakness/fatigue)   Assist Level: Supervision/Verbal cueing   Blood pressure 117/87, pulse 91, temperature 98 F (36.7 C), resp. rate 17, height 6' (1.829 m), weight 110 kg, SpO2 93 %.    Medical Problem List and Plan: 1.  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  Antithrombotics: -DVT/anticoagulation:  Pharmaceutical: Continue Lovenox             -antiplatelet therapy: N/A 3. Pain Management: tylenol prn  tramadol 50 mg q6 hours prn  Continue Lyrica  Controlled  meds on 2/5  2/11- reinforced I'd really like him to take Ibuprofen 1-2x/day- no more- and prefer 1x/day due to elevated Cr- he voiced understanding  2/14- increased lyrica to 75 mg TID yesterday- but with Cr up to 1.65 cannot increase more- can try Duloxetine 30 mg nightly for nerve pain  2/15- d/w pt- he wanted it increased, but explained can not increase Lyrica right now- explained will take a few days for Duloxetine to help  2/16- Ibuprofen changed to daily prn- will d/w pt again tomorrow most likely cause of kidney fxn getting worse- will see if can stop ibuprofen  2/18- changed to 1x/day Ibuprofen- Cr better to 1.35  2/19: c/o ulnar nerve pain: increase Cymbalta to 40mg .   2/20: pain improved: continue current regimen 4. Mood: LCSW to follow for evaluation and support.              -antipsychotic agents: Seroquel 25 5. Neuropsych: This patient is capable of making decisions on his own behalf. 6. Skin/Wound Care:                          DTI with MASD: areas on buttocks improving.  Zinc for moisture barrier  as well as pressure relief measures.  2/10- will try to get off specialty bed- looking at backside when not in therapy/in w/c.   2/11-12- off specialty bed- buttocks healed as per pic above    2/14- still on low air loss mattress- will rmeove     2/16- on regular bed that can stretch to his height 7. Fluids/Electrolytes/Nutrition: Monitor I/O. Encourage fluid intake. Continue to monitor lytes with serial checks.  8. Morbid obesity: Educate on importance of diet and weight loss to help promote overall health. Encourage side lying when in bed.  9. Pre-renal azotemia vs new CKD Stage IIIa: Improving off tube feeds--encourage fluid intake.   2/16- labs tomorrow- Cr issues likely due to Ibuprofen  2/17- Cr down to 1.41- and BUN 20- much improved- con't regimen for now  2/18- Cr down to 1.35- better 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              Hemoglobin  9.5 on 2/3  overall stable- con't regimen 11. Encephalopathy: Has resolved. Continue Seroquel to help with sleep hygeine.  12.  Dysphagia: Resolved  2/2- PEG was placed 12/30- so cannot be removed til minimum 3/1  13. Recent trach: Closed  2/9- has some trach site drainage- appears purulent- will start Keflex 500 mg BID (per CrCl) x 3 days- and see how it goes  2/10- no drainage seen this AM, but will con't to monitor- WBC is better at 5.9k  2/13- stoma closed after silver nitrate, can use bandaid or no dressing  2/14- closed/healed  14.  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  Lyrica 50 mg TID_ explained won't help numbness/weakness, only nerve pain.  2/7- helping nerve pain a lot- con't regimen  2/16- added Duloxetine 30 mg nightly and Lyrica now 75 mg TID- not enough per pt- trying to see if Duloxetine will kick in.   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 Muscle spasms  2/8- will try Skelaxin 800 mg TID prn for muscle spasms- since c/o severe muscle tightness  2/9- didn't complain of muscle spasms today 17. Previous HTN  2/11- refusing BP meds- will stop  2/19-20: hypotensive- continue to monitor.  18. Insomnia  2/11- stopped Seroquel per pt request 19. Loose stools  2/15- make colace prn  2/17- doing better- more regular- not loose- con't regimen      LOS: 25 days A FACE TO FACE EVALUATION WAS PERFORMED  Clide Deutscher Phyllip Claw 01/12/2021, 10:39 AM

## 2021-01-12 NOTE — Plan of Care (Signed)
  Problem: RH SKIN INTEGRITY Goal: RH STG SKIN FREE OF INFECTION/BREAKDOWN Description: Skin free of breakdown and infection with min assist to supervision assistance. Outcome: Progressing Goal: RH STG ABLE TO PERFORM INCISION/WOUND CARE W/ASSISTANCE Description: STG Able To Perform Incision/Wound Care With min Assistance to supervision assistance. Outcome: Progressing   Problem: RH KNOWLEDGE DEFICIT GENERAL Goal: RH STG INCREASE KNOWLEDGE OF SELF CARE AFTER HOSPITALIZATION Outcome: Progressing   Problem: Consults Goal: RH GENERAL PATIENT EDUCATION Description: See Patient Education module for education specifics. Outcome: Progressing Goal: Skin Care Protocol Initiated - if Braden Score 18 or less Description: If consults are not indicated, leave blank or document N/A Outcome: Progressing

## 2021-01-13 LAB — CBC WITH DIFFERENTIAL/PLATELET
Abs Immature Granulocytes: 0.02 10*3/uL (ref 0.00–0.07)
Basophils Absolute: 0 10*3/uL (ref 0.0–0.1)
Basophils Relative: 1 %
Eosinophils Absolute: 0.3 10*3/uL (ref 0.0–0.5)
Eosinophils Relative: 8 %
HCT: 25.6 % — ABNORMAL LOW (ref 39.0–52.0)
Hemoglobin: 8.8 g/dL — ABNORMAL LOW (ref 13.0–17.0)
Immature Granulocytes: 1 %
Lymphocytes Relative: 28 %
Lymphs Abs: 1.1 10*3/uL (ref 0.7–4.0)
MCH: 33.2 pg (ref 26.0–34.0)
MCHC: 34.4 g/dL (ref 30.0–36.0)
MCV: 96.6 fL (ref 80.0–100.0)
Monocytes Absolute: 0.7 10*3/uL (ref 0.1–1.0)
Monocytes Relative: 16 %
Neutro Abs: 1.9 10*3/uL (ref 1.7–7.7)
Neutrophils Relative %: 46 %
Platelets: 192 10*3/uL (ref 150–400)
RBC: 2.65 MIL/uL — ABNORMAL LOW (ref 4.22–5.81)
RDW: 13.5 % (ref 11.5–15.5)
WBC: 4.1 10*3/uL (ref 4.0–10.5)
nRBC: 0 % (ref 0.0–0.2)

## 2021-01-13 LAB — BASIC METABOLIC PANEL
Anion gap: 7 (ref 5–15)
BUN: 22 mg/dL — ABNORMAL HIGH (ref 6–20)
CO2: 28 mmol/L (ref 22–32)
Calcium: 9 mg/dL (ref 8.9–10.3)
Chloride: 106 mmol/L (ref 98–111)
Creatinine, Ser: 1.63 mg/dL — ABNORMAL HIGH (ref 0.61–1.24)
GFR, Estimated: 49 mL/min — ABNORMAL LOW (ref >60)
Glucose, Bld: 87 mg/dL (ref 70–99)
Potassium: 4.2 mmol/L (ref 3.5–5.1)
Sodium: 141 mmol/L (ref 135–145)

## 2021-01-13 MED ORDER — MIRABEGRON ER 25 MG PO TB24
25.0000 mg | ORAL_TABLET | Freq: Every day | ORAL | Status: DC
  Administered 2021-01-13 – 2021-01-23 (×11): 25 mg via ORAL
  Filled 2021-01-13 (×12): qty 1

## 2021-01-13 NOTE — Plan of Care (Signed)
  Problem: RH SKIN INTEGRITY Goal: RH STG SKIN FREE OF INFECTION/BREAKDOWN Description: Skin free of breakdown and infection with min assist to supervision assistance. Outcome: Progressing Goal: RH STG ABLE TO PERFORM INCISION/WOUND CARE W/ASSISTANCE Description: STG Able To Perform Incision/Wound Care With min Assistance to supervision assistance. Outcome: Progressing   Problem: RH KNOWLEDGE DEFICIT GENERAL Goal: RH STG INCREASE KNOWLEDGE OF SELF CARE AFTER HOSPITALIZATION Outcome: Progressing   Problem: Consults Goal: RH GENERAL PATIENT EDUCATION Description: See Patient Education module for education specifics. Outcome: Progressing Goal: Skin Care Protocol Initiated - if Braden Score 18 or less Description: If consults are not indicated, leave blank or document N/A Outcome: Progressing

## 2021-01-13 NOTE — Progress Notes (Signed)
Occupational Therapy Session Note  Patient Details  Name: Jackson Figueroa MRN: [id number] Date of Birth: 1961-08-24  Today's Date: 01/13/2021 OT Individual Time: 0900-1000 OT Individual Time Calculation (min): 60 min    Short Term Goals: Week 4:  OT Short Term Goal 1 (Week 4): Pt will be able to tolerate standing in [place] stedy for at least 3 minutes to demonstrate improved endurance. OT Short Term Goal 2 (Week 4): Pt will perform toilet hygiene with min A  Skilled Therapeutic Interventions/Progress Updates:    Pt seated EOB upon arrival. Stand pivot transfer to w/c with CGA (bed elevated). Pt completed grooming tasks at sink and propelled w/c to nursing station. Pt transitioned to ortho gym and initially engaged in [place] activities while standing with RW (2 segments of sequencing with alternating numbers/letters) Pt stood for a total of 5 mins with one rest break. Pt transitioned to main gym and stood with RW at [employer]. Pt tossed ball against rebounder-2x20. Pt also completed 7 mins on NuStep at level 5. Pt returned to room and performed scoot tranfser to bed with supervision.Pt remained seated EOB with all needs within reach.    Therapy Documentation Precautions:  Precautions Precautions: Fall Precaution Comments: PEG, sacral and toe wounds Restrictions Weight Bearing Restrictions: No   Pain:  Pt c/o ongoing RUE neuropathic pain; MD and RN aware  Therapy/Group: Individual Therapy  [name] 01/13/2021, 12:17 PM

## 2021-01-13 NOTE — Progress Notes (Signed)
Physical Therapy Session Note  Patient Details  Name: Jackson Figueroa MRN: [id number] Date of Birth: November 29, 1960  Today's Date: 01/13/2021 PT Individual Time: 1100-1200; 1300-1400 PT Individual Time Calculation (min): 60 min and 60 min  Short Term Goals: Week 4:  PT Short Term Goal 1 (Week 4): =LTG due to ELOS  Skilled Therapeutic Interventions/Progress Updates:    Session 1: Pt received seated EOB, agreeable to PT session. Pt reports ongoing nerve pain in RUE, not rated and premedicated prior to start of therapy session. Applied wash cloth to R hand grip portion of RW for improved grip in standing. Squat pivot transfer bed to/from w/c to/from mat table at CGA level throughout session, decreased ability to clear buttocks with onset of fatigue. Sit to stand to standing scale with mod A from elevated mat table. Pt currently weighs 262.2 lbs. Sit to stand with up to mod A to RW throughout session, increased assist needed from lower surfaces including from w/c. Ambulation 2 x 50 ft, 1 x 80 ft with RW initially with mod A progressing to min A with close w/c follow for safety. Pt exhibits significantly improved mobility with ability to perform gait with RW with no body-weight supported system this date! Pt also continues to exhibit improved width of BOS and decreased LE buckling in standing. Pt also exhibits heavy UE reliance on RW with gait, increased support needed with onset of fatigue. Standing alt L/R 4" step-taps with RW and min A for balance. Standing alt L/R 1" step-ups with RW 3 x 10 reps and min A x 2 for balance and safety. Pt also exhibits significant improvement in LE strength with ability to perform step-ups this date. Stand pivot transfer mat table to w/c with RW and min A. Pt requests to return to bed at end of session. Squat pivot transfer back to bed with CGA. Pt left seated EOB in room with needs in reach at end of session.  Session 2: Pt received seated EOB, agreeable to PT session. Ongoing  complaints of pain in RUE due to nerve pain, not rated. Squat pivot transfer bed to w/c with CGA. Dependent transport to therapy gym for time conservation. Sit to stand with mod A to RW. Ambulation x 50 ft with RW and min/mod A with close w/c follow for safety. Standing alt L/R 3" step-ups with BUE support on handrails and min A for balance, 3 x 10 reps to fatigue. Pt has onset of B knee buckling with onset of fatigue. Stand pivot transfer w/c to/from car at 32" height to simulate car pt will d/c home in with min A and use of RW. Manual w/c propulsion x 100 ft with use of BUE at Supervision level before onset of fatigue. Squat pivot transfer back to bed with CGA. Sit to supine Supervision. Pt left semi-reclined in bed with needs in reach, ice packs to B knees at end of session.  Therapy Documentation Precautions:  Precautions Precautions: Fall Precaution Comments: PEG, sacral and toe wounds Restrictions Weight Bearing Restrictions: No   Therapy/Group: Individual Therapy   Excell Seltzer, PT, DPT  01/13/2021, 12:31 PM

## 2021-01-13 NOTE — Progress Notes (Signed)
[facility] PHYSICAL MEDICINE & REHABILITATION PROGRESS NOTE   Subjective/Complaints:  Ulnar nerve pain "about the same' but is better with Duloxetine.   Asking when is going to get PEG removed- explained today is 2/21- won't be until 3/1, which is next week.   Also reports has "gotta go gotta go" bladder issues- has accidents because cannot get to urinal in time sometimes.    ROS:    Pt denies SOB, abd pain, CP, N/V/C/D, and vision changes   Objective:   No results found. Recent Labs    01/13/21 0514  WBC 4.1  HGB 8.8*  HCT 25.6*  PLT 192   Recent Labs    01/13/21 0514  NA 141  K 4.2  CL 106  CO2 28  GLUCOSE 87  BUN 22*  CREATININE 1.63*  CALCIUM 9.0    Intake/Output Summary (Last 24 hours) at 01/13/2021 1951 Last data filed at 01/13/2021 1905 Gross per 24 hour  Intake 1020 ml  Output 2500 ml  Net -1480 ml     Pressure Injury 11/18/20 Toe (Comment  which one) Anterior;Right Deep Tissue Pressure Injury - Purple or maroon localized area of discolored intact skin or blood-filled blister due to damage of underlying soft tissue from pressure and/or shear. DTI left  (Active)  11/18/20 2330  Location: Toe (Comment  which one) (LEFT big toe)  Location Orientation: Anterior;Right  Staging: Deep Tissue Pressure Injury - Purple or maroon localized area of discolored intact skin or blood-filled blister due to damage of underlying soft tissue from pressure and/or shear.  Wound Description (Comments): DTI left toe  Present on Admission: Yes (on admit to [facility]M)    Physical Exam: Vital Signs Blood pressure 130/79, pulse 80, temperature 97.9 F (36.6 C), resp. rate 18, height 6' (1.829 m), weight 110 kg, SpO2 96 %. Gen: laying in bed; appropriate, NAD HEENT: oral mucosa pink and moist, NCAT- conjugate gaze Cardio: RRR- no JVD Chest: CTA B/L- no W/R/R- good air movement Abd: Soft, NT, ND, (+)BS - PEG in place- not PEG I've removed before.  Ext: no edema Psych:  pleasant, normal affect Ext: no clubbing, cyanosis, Top of R foot trace/slightly puffy- TTP- less TTP Psych: appropriate- no change Skin: Warm and dry.   Sacrum- pictured below--stable- resolved Neuro: Ox3 Right ulnar neuropathy- numbness in lateral 4th/5th digit (more in 5th digit numbness) on R with intrinsic atrophy- exam is consistent- no change Motor: Right upper extremity: Shoulder abduction 4 --4/5, distally 3/5 Left upper extremity: 4/5 proximal distal Bilateral lower extremities: Hip flexion, knee extension 3 -/5, ankle dorsiflexion 4/5     Assessment/Plan: 1. Functional deficits which require 3+ hours per day of interdisciplinary therapy in a comprehensive inpatient rehab setting.  Physiatrist is providing close team supervision and 24 hour management of active medical problems listed below.  Physiatrist and rehab team continue to assess barriers to discharge/monitor patient progress toward functional and medical goals  Care Tool:  Bathing    Body parts bathed by patient: Right arm,Left arm,Chest,Abdomen,Front perineal area,Right upper leg,Left upper leg,Right lower leg,Left lower leg,Face,Buttocks   Body parts bathed by helper: Buttocks     Bathing assist Assist Level: Minimal Assistance - Patient > 75%     Upper Body Dressing/Undressing Upper body dressing   What is the patient wearing?: Pull over shirt    Upper body assist Assist Level: Independent    Lower Body Dressing/Undressing Lower body dressing      What is the patient wearing?: Pants  Lower body assist Assist for lower body dressing: Minimal Assistance - Patient > 75%     Toileting Toileting    Toileting assist Assist for toileting: Dependent - Patient 0% (using bariatric Stedy) Assistive Device Comment: Urinal   Transfers Chair/bed transfer  Transfers assist  Chair/bed transfer activity did not occur: Safety/medical concerns (due to weakness/fatigue)  Chair/bed transfer assist  level: Minimal Assistance - Patient > 75% (stand pivot with RW)     Locomotion Ambulation   Ambulation assist   Ambulation activity did not occur: Safety/medical concerns (due to weakness/fatigue)  Assist level: 2 helpers Assistive device: Walker-rolling Max distance: 80'   Walk 10 feet activity   Assist  Walk 10 feet activity did not occur: Safety/medical concerns (due to weakness/fatigue)  Assist level: 2 helpers Assistive device: Walker-rolling   Walk 50 feet activity   Assist Walk 50 feet with 2 turns activity did not occur: Safety/medical concerns (due to weakness/fatigue)  Assist level: 2 helpers Assistive device: Walker-rolling    Walk 150 feet activity   Assist Walk 150 feet activity did not occur: Safety/medical concerns (due to weakness/fatigue)         Walk 10 feet on uneven surface  activity   Assist Walk 10 feet on uneven surfaces activity did not occur: Safety/medical concerns (due to weakness/fatigue)         Wheelchair     Assist Will patient use wheelchair at discharge?: Yes Type of Wheelchair: Manual Wheelchair activity did not occur: Safety/medical concerns (due to weakness/fatigue)  Wheelchair assist level: Supervision/Verbal cueing Max wheelchair distance: 100'    Wheelchair 50 feet with 2 turns activity    Assist    Wheelchair 50 feet with 2 turns activity did not occur: Safety/medical concerns (due to weakness/fatigue)   Assist Level: Supervision/Verbal cueing   Wheelchair 150 feet activity     Assist  Wheelchair 150 feet activity did not occur: Safety/medical concerns (due to weakness/fatigue)   Assist Level: Supervision/Verbal cueing   Blood pressure 130/79, pulse 80, temperature 97.9 F (36.6 C), resp. rate 18, height 6' (1.829 m), weight 110 kg, SpO2 96 %.    Medical Problem List and Plan: 1.  Diffuse weakness with decreased endurance, balance deficits and pain affecting mobility and ADLs secondary  to CIP/CIM- I.e ICU myopathy/neuropathy  2/21- will Remove PEG on 3/1- might require IR to pull? Will check.              Continue CIR PT, OT 2.  Antithrombotics: -DVT/anticoagulation:  Pharmaceutical: Continue Lovenox             -antiplatelet therapy: N/A 3. Pain Management: tylenol prn  tramadol 50 mg q6 hours prn  Continue Lyrica  Controlled meds on 2/5  2/11- reinforced I'd really like him to take Ibuprofen 1-2x/day- no more- and prefer 1x/day due to elevated Cr- he voiced understanding  2/14- increased lyrica to 75 mg TID yesterday- but with Cr up to 1.65 cannot increase more- can try Duloxetine 30 mg nightly for nerve pain  2/15- d/w pt- he wanted it increased, but explained can not increase Lyrica right now- explained will take a few days for Duloxetine to help  2/16- Ibuprofen changed to daily prn- will d/w pt again tomorrow most likely cause of kidney fxn getting worse- will see if can stop ibuprofen  2/18- changed to 1x/day Ibuprofen- Cr better to 1.35  2/19: c/o ulnar nerve pain: increase Cymbalta to 40mg .   2/20: pain improved: continue current regimen  2/21 -  Cr up to 1.63 again- it's the Ibuprofen- will d/w pt to remove 4. Mood: LCSW to follow for evaluation and support.              -antipsychotic agents: Seroquel 25 2/21- off Seroquel 5. Neuropsych: This patient is capable of making decisions on his own behalf. 6. Skin/Wound Care:                          DTI with MASD: areas on buttocks improving.  Zinc for moisture barrier as well as pressure relief measures.  2/10- will try to get off specialty bed- looking at backside when not in therapy/in w/c.   2/11-12- off specialty bed- buttocks healed as per pic above    2/14- still on low air loss mattress- will rmeove     2/16- on regular bed that can stretch to his height 7. Fluids/Electrolytes/Nutrition: Monitor I/O. Encourage fluid intake. Continue to monitor lytes with serial checks.  8. Morbid obesity: Educate on  importance of diet and weight loss to help promote overall health. Encourage side lying when in bed.  9. Pre-renal azotemia vs new CKD Stage IIIa: Improving off tube feeds--encourage fluid intake.   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              Hemoglobin 9.5 on 2/3  overall stable- con't regimen 11. Encephalopathy: Has resolved. Continue Seroquel to help with sleep hygeine.  12.  Dysphagia: Resolved  2/2- PEG was placed 12/30- so cannot be removed til minimum 3/1  13. Recent trach: Closed  2/9- has some trach site drainage- appears purulent- will start Keflex 500 mg BID (per CrCl) x 3 days- and see how it goes  2/10- no drainage seen this AM, but will con't to monitor- WBC is better at 5.9k  2/13- stoma closed after silver nitrate, can use bandaid or no dressing  2/14- closed/healed  14.  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  Lyrica 50 mg TID_ explained won't help numbness/weakness, only nerve pain.  2/7- helping nerve pain a lot- con't regimen  2/16- added Duloxetine 30 mg nightly and Lyrica now 75 mg TID- not enough per pt- trying to see if Duloxetine will kick in.   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 Muscle spasms  2/8- will try Skelaxin 800 mg TID prn for muscle spasms- since c/o severe muscle tightness  2/9- didn't  complain of muscle spasms today 17. Previous HTN  2/11- refusing BP meds- will stop  2/19-20: hypotensive- continue to monitor.  18. Insomnia  2/11- stopped Seroquel per pt request 19. Loose stools  2/15- make colace prn  2/17- doing better- more regular- not loose- con't regimen 20. Bladder accidents/urgency/incontinence  2/21- Oxybutynin with the meds he's  on, will dry him out too much- will try Myrbetriq for now 25 mg daily.       LOS: 26 days A FACE TO FACE EVALUATION WAS PERFORMED  Alayssa Flinchum 01/13/2021, 7:51 PM

## 2021-01-13 NOTE — Plan of Care (Signed)
  Problem: RH Ambulation Goal: LTG Patient will ambulate in controlled environment (PT) Description: LTG: Patient will ambulate in a controlled environment, # of feet with assistance (PT). Flowsheets (Taken 01/13/2021 0752) LTG: Pt will ambulate in controlled environ  assist needed:: (downgrade due to slow progress) Minimal Assistance - Patient > 75% Note: Downgrade due to slow progress Goal: LTG Patient will ambulate in home environment (PT) Description: LTG: Patient will ambulate in home environment, # of feet with assistance (PT). Flowsheets (Taken 01/13/2021 0752) LTG: Pt will ambulate in home environ  assist needed:: (downgrade due to slow progress) Minimal Assistance - Patient > 75% Note: Downgrade due to slow progress   Problem: RH Stairs Goal: LTG Patient will ambulate up and down stairs w/assist (PT) Description: LTG: Patient will ambulate up and down # of stairs with assistance (PT) Flowsheets (Taken 01/13/2021 0752) LTG: Pt will ambulate up/down stairs assist needed:: (d/c goal due to slow progress) -- Note: D/c goal due to slow progress

## 2021-01-14 MED ORDER — DULOXETINE HCL 60 MG PO CPEP
60.0000 mg | ORAL_CAPSULE | Freq: Every day | ORAL | Status: DC
  Administered 2021-01-14 – 2021-01-22 (×9): 60 mg via ORAL
  Filled 2021-01-14 (×9): qty 1

## 2021-01-14 NOTE — Progress Notes (Signed)
Physical Therapy Session Note  Patient Details  Name: Jackson Figueroa MRN: [id number] Date of Birth: December 09, 1960  Today's Date: 01/14/2021 PT Individual Time: 0800-0900; 1300-1400 PT Individual Time Calculation (min): 60 min and 60 min  Short Term Goals: Week 4:  PT Short Term Goal 1 (Week 4): =LTG due to ELOS  Skilled Therapeutic Interventions/Progress Updates:    Session 1: Pt received sidelying in bed asleep, arousable and agreeable to PT session. Pt reports ongoing pain in RUE (nerve pain) and B knees at rest, nursing able to provide medication at end of session. Supine to sit with min A needed for some trunk elevation. Squat pivot transfer bed to w/c with CGA. Manual w/c propulsion x 150 ft with use of BUE at Supervision level, onset of R shoulder pain with w/c propulsion that resolves at rest. Squat pivot transfer w/c to/from mat table with CGA. Sit to stand with mod A to RW x 5 reps with focus on anterior weight shift with transfer. Standing mini-squats x 7 reps with multimodal cueing for body positioning, use of RW, then pt has onset of dizziness. Dizziness resolves in seated position. Ambulation x 50 ft and 80 ft with RW and min A with close w/c follow from a 2nd person for safety. Pt exhibits intermittent LE buckling during gait but no falls or near falls. Pt returned to bed at end of session. Pt left semi-reclined in bed with needs in reach.  Session 2: Pt received seated EOB, agreeable to PT session. No complaints of pain. Squat pivot transfer bed to w/c with min A needed for safety. Sit to stand with min to mod A to RW throughout session. Stand pivot transfer w/c to/from mat table with RW and min A. Standing balance/tolerance 3 x 5 min to fatigue while playing volleyball with use of alt UE and intermittently BUE, CGA x 2 for standing balance for safety. Ambulation 2 x 100 ft, 1 x 180 ft with RW and min A for balance with close w/c follow for safety. Pt exhibits one instance of B knees buckling  when turning towards end of walk when he had increased fatigue, able to catch himself to prevent fall. Pt left seated EOB with needs in reach, bed alarm in place at end of session. Cotreatment session with LRT.  Therapy Documentation Precautions:  Precautions Precautions: Fall Precaution Comments: PEG, sacral and toe wounds Restrictions Weight Bearing Restrictions: No   Therapy/Group: Individual Therapy   Excell Seltzer, PT, DPT  01/14/2021, 12:17 PM

## 2021-01-14 NOTE — Progress Notes (Signed)
Patient ID: Jackson Figueroa, male   DOB: [birth year], 60 y.o.   MRN: [id number]  SW gave pt HHA list. SW informed on DME recommendations: w/c, RW, and DABSC. SW discussed with pt family edu with s/o [name],however, he reports she currently has a medical issue she is resolving and will let us know when she can come in. SW will follow-up about HHA preference.   SW ordered DME with [employer] via parachute.   Loralee Pacas, MSW, [place] Office: [phone] Cell: [phone] Fax: [phone]

## 2021-01-14 NOTE — Progress Notes (Signed)
Recreational Therapy Session Note  Patient Details  Name: Jackson Figueroa MRN: [id number] Date of Birth: 1961-10-19 Today's Date: 01/14/2021  Pain: no c/o Skilled Therapeutic Interventions/Progress Updates: Session focused on activity tolerance and dynamic standing balance during co-treat with PT.  Pt stood with contact guard assist for volleyball activity tapping a beach ball back and forth with LRT alternating UEs, initially with 1 UE support but transitioned to no UE support.  Therapeutic use of music throughout session for increased mood and motivation.  Therapy/Group: Co-Treatment   [name] 01/14/2021, 3:06 PM

## 2021-01-14 NOTE — Progress Notes (Signed)
Physical Therapy Session Note  Patient Details  Name: Jackson Figueroa MRN: [id number] Date of Birth: 04/25/1961  Today's Date: 01/14/2021 PT Individual Time: 0998-3382 PT Individual Time Calculation (min): 27 min   Short Term Goals: Week 4:  PT Short Term Goal 1 (Week 4): =LTG due to ELOS  Skilled Therapeutic Interventions/Progress Updates:    Patient seated EOB and ready for PT.  Reported distance walked in earlier session.  Performed squat-pivot transfer to w/c with close S.  Patient assisted in w/c to therapy gym.  Patient sit to stand to RW with mod A from w/c then ambulated 3' to steps.  Transitioned hands to rails.  Negotiated forward up 3" steps x 8 then down with reverse technique (preferred by patient due to knee pain.)  Seated rest, then negotiated 8 steps x 2 with rails and min A same technique.  Patient assisted in w/c to room.  Squat pivot to bed with CGA.  Patient sit to supine with S and left with call bell in reach and bed alarm active.   Therapy Documentation Precautions:  Precautions Precautions: Fall Precaution Comments: PEG, sacral and toe wounds Restrictions Weight Bearing Restrictions: No Pain: Pain Assessment Pain Scale: 0-10 Pain Score: 0-No pain   Therapy/Group: Individual Therapy  Reginia Naas  [place], [place] 01/14/2021, 4:59 PM

## 2021-01-14 NOTE — Progress Notes (Signed)
Occupational Therapy Weekly Progress Note  Patient Details  Name: Jackson Figueroa MRN: [id number] Date of Birth: [birth year]  Beginning of progress report period: January 08, 2021 End of progress report period: January 14, 2021  Patient has met 2 of 2 short term goals.  Pt made excellent progress with BADLs and functional transfers during the past week. Pt is supervision for dressing sitting EOB and min A for LB dressing when standing with RW to pull pants over hips. Pt performs squat pivot and stand pivot transfers (with RW) with CGA. Pt using RUE at diminished level. Pt's SO to come in for education during the next week.  Patient continues to demonstrate the following deficits: muscle weakness, decreased cardiorespiratoy endurance, impaired timing and sequencing, unbalanced muscle activation, decreased coordination and decreased motor planning and decreased sitting balance, decreased standing balance, decreased postural control and decreased balance strategies and therefore will continue to benefit from skilled OT intervention to enhance overall performance with BADL and Reduce care partner burden.  Patient progressing toward long term goals..  Continue plan of care.  OT Short Term Goals Week 4:  OT Short Term Goal 1 (Week 4): Pt will be able to tolerate standing in [place] stedy for at least 3 minutes to demonstrate improved endurance. OT Short Term Goal 1 - Progress (Week 4): Met OT Short Term Goal 2 (Week 4): Pt will perform toilet hygiene with min A OT Short Term Goal 2 - Progress (Week 4): Met Week 5:   STG=LTG 2/2 ELOS     [name] [facility] 01/14/2021, 6:25 AM

## 2021-01-14 NOTE — Progress Notes (Signed)
Occupational Therapy Session Note  Patient Details  Name: Delaney Schnick MRN: [id number] Date of Birth: 1961/11/04  Today's Date: 01/14/2021 OT Individual Time: 1000-1055 OT Individual Time Calculation (min): 55 min    Short Term Goals: Week 5:  OT Short Term Goal 1 (Week 5): STG=LTG 2/2 ELOS  Skilled Therapeutic Interventions/Progress Updates:    OT intervention with focus on squat pivot transfers/stand pivot transfer, BADL retraining, safety awareness, and activity tolerance to increase independence with BADLs. All transfers with CGA/supervision. Pt uses grab bars in shower to complete stand pivot transfers. Recommended grab bars for home shower. Dressing EOB with supervision; supine for pulling pants over hips when bridging. Pt progressing well. Pt has walk-in shower at home and will need to be able to step over edge. Pt remained in bed with all needs within reach and bed alarm activated.  Therapy Documentation Precautions:  Precautions Precautions: Fall Precaution Comments: PEG, sacral and toe wounds Restrictions Weight Bearing Restrictions: No   Pain: Pt c/o ongoing RUE (ulnar aspect) neuropathic pain; RN/MD aware  Therapy/Group: Individual Therapy  [name] 01/14/2021, 12:00 PM

## 2021-01-14 NOTE — Patient Care Conference (Signed)
Inpatient RehabilitationTeam Conference and Plan of Care Update Date: 01/14/2021   Time: 11:12 AM     Patient Name: Jackson Figueroa      Medical Record Number: [id number]  Date of Birth: [birth year] Sex: Male         Room/Bed: 4W10C/4W10C-01 Payor Info: Payor: [place] / Plan: BCBS COMM PPO / Product Type: *No Product type* /    Admit Date/Time:  12/18/2020  6:30 PM  Primary Diagnosis:  Critical illness polyneuropathy [facility])  Hospital Problems: Principal Problem:   Critical illness polyneuropathy ([place]) Active Problems:   Critical illness myopathy   Debility   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 Courtney Heys Care Coodinator Present: Loralee Pacas, LCSWA;Stacey Creig Hines, RN, BSN, [place] Nurse Present: Other (comment) Jarvis Morgan, RN) PT Present: Excell Seltzer, PT OT Present: Willeen Cass, OT;Roanna Epley, COTA PPS Coordinator present : Gunnar Fusi, SLP     Current Status/Progress Goal Weekly Team Focus  Bowel/Bladder   Pt continent of B/B with occasional urgency LBM 01/12/2021  decreased urinary urgency. BMs every 3 days or less  Assess B/B every shift and PRN   Swallow/Nutrition/ Hydration             ADL's   bathing at shower level-CGA; UB dressing-supervision; LB bathing/dressing-min A; toileting-min A; stand pivot transfers-min A/CGA  CGA overall  overall strengthening/endurance; sitting balance, functioal transfers, BADL training, education   Mobility   Supervision bed mobility, CGA squat pivot, min A stand pivot transfer with RW, gait up to 80 ft RW with assist x 2 (w/c follow)  CGA overall, gait x 50 ft CGA with LRAD, d/c stair goal but due to progress may be able to add stair goal back in  gait training, initiating stairs,  family edu leading up to d/c   Communication             Safety/Cognition/ Behavioral Observations            Pain   Pt complains of back and generalized pain about 5/10 using PRN Tramadol  Pain scale <3/10  Assess pain every shift and PRN   Skin   No skin issues  No new breakdown  Assess skin every shift and PRN     Discharge Planning:  Pt to d/c to home with s/o [name] who will provide 24/7 care to pt. She has back issues so patient would be more appropriate for atleast Supervision to Mod I at d/c to reduce caregier burden.   Team Discussion: Stopping Ibuprofen d/t Creatinine issues. Increased Duloxetine to 60 mg daily, started Myrbetriq for bladder. Complains of right hand pain, continent B/B. Needs to be close to Mod I at discharge.  Patient on target to meet rehab goals: Mod assist at times with RW. Started going up 3 steps yesterday. Transfer are going well, able to do personal hygiene, able to use grab bars in the shower.  *See Care Plan and progress notes for long and short-term goals.   Revisions to Treatment Plan:  Increased Duloxetine, added Myrbetriq. Teaching Needs: Family education, medication management, transfer training, gait training, stair training, endurance training.  Current Barriers to Discharge: Inaccessible home environment, Decreased caregiver support, Home enviroment access/layout, Incontinence, Lack of/limited family support, Weight, Medication compliance and Behavior  Possible Resolutions to  Barriers: Continue current medications, provide emotional support to patient and family.     Medical Summary Current Status: Urinary urgency/incontinence; only pain is R ulnar neuropathy- B/B usually continent- occ accidents with bladder; Cr 1.63-  Barriers to Discharge: Decreased family/caregiver support;Home enviroment access/layout;Incontinence;Medical stability;Weight;Other (comments)  Barriers to Discharge Comments: Urinary issues-- needs to be mod I at d/c.  wound care issues are pretty much resolved. Possible Resolutions to [employer]: started Myrbetriq for bladder; stopped ibuprofen for Cr issues- also increased Duloxetine to 60 mg daily for nerve pain; - d/c 3/1   Continued Need for Acute Rehabilitation Level of Care: The patient requires daily medical management by a physician with specialized training in physical medicine and rehabilitation for the following reasons: Direction of a multidisciplinary physical rehabilitation program to maximize functional independence : Yes Medical management of patient stability for increased activity during participation in an intensive rehabilitation regime.: Yes Analysis of laboratory values and/or radiology reports with any subsequent need for medication adjustment and/or medical intervention. : Yes   I attest that I was present, lead the team conference, and concur with the assessment and plan of the team.   Dorthula Nettles G 01/14/2021, 6:00 PM

## 2021-01-14 NOTE — Progress Notes (Signed)
[facility] PHYSICAL MEDICINE & REHABILITATION PROGRESS NOTE   Subjective/Complaints:  Pt reports doesn't think Duloxetine was working for pain- is on 40 mg daily- just increased this weekend- we discussed changing to Keppra, but Duloxetine only on 40 mg- will increase to 60 mg daily and monitor for a few days.     ROS:  Pt denies SOB, abd pain, CP, N/V/C/D, and vision changes  Objective:   No results found. Recent Labs    01/13/21 0514  WBC 4.1  HGB 8.8*  HCT 25.6*  PLT 192   Recent Labs    01/13/21 0514  NA 141  K 4.2  CL 106  CO2 28  GLUCOSE 87  BUN 22*  CREATININE 1.63*  CALCIUM 9.0    Intake/Output Summary (Last 24 hours) at 01/14/2021 9518 Last data filed at 01/14/2021 8416 Gross per 24 hour  Intake 1056 ml  Output 3450 ml  Net -2394 ml     Pressure Injury 11/18/20 Toe (Comment  which one) Anterior;Right Deep Tissue Pressure Injury - Purple or maroon localized area of discolored intact skin or blood-filled blister due to damage of underlying soft tissue from pressure and/or shear. DTI left  (Active)  11/18/20 2330  Location: Toe (Comment  which one) (LEFT big toe)  Location Orientation: Anterior;Right  Staging: Deep Tissue Pressure Injury - Purple or maroon localized area of discolored intact skin or blood-filled blister due to damage of underlying soft tissue from pressure and/or shear.  Wound Description (Comments): DTI left toe  Present on Admission: Yes (on admit to 35M)    Physical Exam: Vital Signs Blood pressure 120/88, pulse 82, temperature 97.9 F (36.6 C), resp. rate 18, height 6' (1.829 m), weight 110 kg, SpO2 93 %. Gen: sitting EOB, appropriate, NAD HEENT: oral mucosa pink and moist, NCAT- conjugate gaze Cardio: RRR Chest: CTA B/L- no W/R/R- good air movement Abd: Soft, NT, ND, (+)BS  - PEG in place- not PEG I've removed before.  Ext: no edema Psych: pleasant, normal affect Ext: no clubbing, cyanosis, Top of R foot trace/slightly puffy-  TTP- less TTP Psych: appropriate- no change Skin: Warm and dry.   Sacrum- pictured below--stable- resolved Neuro: Ox3 Right ulnar neuropathy- numbness in lateral 4th/5th digit (more in 5th digit numbness) on R with intrinsic atrophy- exam is consistent- no change Motor: Right upper extremity: Shoulder abduction 4 --4/5, distally 3/5 Left upper extremity: 4/5 proximal distal Bilateral lower extremities: Hip flexion, knee extension 3 -/5, ankle dorsiflexion 4/5     Assessment/Plan: 1. Functional deficits which require 3+ hours per day of interdisciplinary therapy in a comprehensive inpatient rehab setting.  Physiatrist is providing close team supervision and 24 hour management of active medical problems listed below.  Physiatrist and rehab team continue to assess barriers to discharge/monitor patient progress toward functional and medical goals  Care Tool:  Bathing    Body parts bathed by patient: Right arm,Left arm,Chest,Abdomen,Front perineal area,Right upper leg,Left upper leg,Right lower leg,Left lower leg,Face,Buttocks   Body parts bathed by helper: Buttocks     Bathing assist Assist Level: Minimal Assistance - Patient > 75%     Upper Body Dressing/Undressing Upper body dressing   What is the patient wearing?: Pull over shirt    Upper body assist Assist Level: Independent    Lower Body Dressing/Undressing Lower body dressing      What is the patient wearing?: Pants     Lower body assist Assist for lower body dressing: Minimal Assistance - Patient > 75%  Toileting Toileting    Toileting assist Assist for toileting: Dependent - Patient 0% (using bariatric Stedy) Assistive Device Comment: Urinal   Transfers Chair/bed transfer  Transfers assist  Chair/bed transfer activity did not occur: Safety/medical concerns (due to weakness/fatigue)  Chair/bed transfer assist level: Minimal Assistance - Patient > 75% (stand pivot with RW)      Locomotion Ambulation   Ambulation assist   Ambulation activity did not occur: Safety/medical concerns (due to weakness/fatigue)  Assist level: 2 helpers Assistive device: Walker-rolling Max distance: 80'   Walk 10 feet activity   Assist  Walk 10 feet activity did not occur: Safety/medical concerns (due to weakness/fatigue)  Assist level: 2 helpers Assistive device: Walker-rolling   Walk 50 feet activity   Assist Walk 50 feet with 2 turns activity did not occur: Safety/medical concerns (due to weakness/fatigue)  Assist level: 2 helpers Assistive device: Walker-rolling    Walk 150 feet activity   Assist Walk 150 feet activity did not occur: Safety/medical concerns (due to weakness/fatigue)         Walk 10 feet on uneven surface  activity   Assist Walk 10 feet on uneven surfaces activity did not occur: Safety/medical concerns (due to weakness/fatigue)         Wheelchair     Assist Will patient use wheelchair at discharge?: Yes Type of Wheelchair: Manual Wheelchair activity did not occur: Safety/medical concerns (due to weakness/fatigue)  Wheelchair assist level: Supervision/Verbal cueing Max wheelchair distance: 100'    Wheelchair 50 feet with 2 turns activity    Assist    Wheelchair 50 feet with 2 turns activity did not occur: Safety/medical concerns (due to weakness/fatigue)   Assist Level: Supervision/Verbal cueing   Wheelchair 150 feet activity     Assist  Wheelchair 150 feet activity did not occur: Safety/medical concerns (due to weakness/fatigue)   Assist Level: Supervision/Verbal cueing   Blood pressure 120/88, pulse 82, temperature 97.9 F (36.6 C), resp. rate 18, height 6' (1.829 m), weight 110 kg, SpO2 93 %.    Medical Problem List and Plan: 1.  Diffuse weakness with decreased endurance, balance deficits and pain affecting mobility and ADLs secondary to CIP/CIM- I.e ICU myopathy/neuropathy  2/21- will Remove PEG on  3/1- might require IR to pull? Will check.              Continue CIR PT, OT 2.  Antithrombotics: -DVT/anticoagulation:  Pharmaceutical: Continue Lovenox             -antiplatelet therapy: N/A 3. Pain Management: tylenol prn  tramadol 50 mg q6 hours prn  Continue Lyrica  Controlled meds on 2/5  2/11- reinforced I'd really like him to take Ibuprofen 1-2x/day- no more- and prefer 1x/day due to elevated Cr- he voiced understanding  2/14- increased lyrica to 75 mg TID yesterday- but with Cr up to 1.65 cannot increase more- can try Duloxetine 30 mg nightly for nerve pain  2/15- d/w pt- he wanted it increased, but explained can not increase Lyrica right now- explained will take a few days for Duloxetine to help  2/16- Ibuprofen changed to daily prn- will d/w pt again tomorrow most likely cause of kidney fxn getting worse- will see if can stop ibuprofen  2/18- changed to 1x/day Ibuprofen- Cr better to 1.35  2/19: c/o ulnar nerve pain: increase Cymbalta to 40mg .   2/20: pain improved: continue current regimen  2/21 - Cr up to 1.63 again- it's the Ibuprofen- will d/w pt to remove  2/22- stopped Ibuprofen-  pt agrees it's likely the cause. Will increase Duloxetine to 60 mg QHS and see if that helps pain- if not, will try Keppra.  4. Mood: LCSW to follow for evaluation and support.              -antipsychotic agents: Seroquel 25 2/21- off Seroquel 5. Neuropsych: This patient is capable of making decisions on his own behalf. 6. Skin/Wound Care:                          DTI with MASD: areas on buttocks improving.  Zinc for moisture barrier as well as pressure relief measures.  2/10- will try to get off specialty bed- looking at backside when not in therapy/in w/c.   2/11-12- off specialty bed- buttocks healed as per pic above    2/14- still on low air loss mattress- will rmeove     2/16- on regular bed that can stretch to his height 7. Fluids/Electrolytes/Nutrition: Monitor I/O. Encourage fluid intake.  Continue to monitor lytes with serial checks.  8. Morbid obesity: Educate on importance of diet and weight loss to help promote overall health. Encourage side lying when in bed.  9. Pre-renal azotemia vs new CKD Stage IIIa: Improving off tube feeds--encourage fluid intake.   2/16- labs tomorrow- Cr issues likely due to Ibuprofen  2/17- Cr down to 1.41- and BUN 20- much improved- con't regimen for now  2/18- Cr down to 1.35- better  2/21- Cr back up to 1.63- is likely ibuprofen- will d/w pt tomorrow  2/22- d/c ibuprofen 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              Hemoglobin 9.5 on 2/3  overall stable- con't regimen 11. Encephalopathy: Has resolved. Continue Seroquel to help with sleep hygeine.  12.  Dysphagia: Resolved  2/2- PEG was placed 12/30- so cannot be removed til minimum 3/1  13. Recent trach: Closed  2/9- has some trach site drainage- appears purulent- will start Keflex 500 mg BID (per CrCl) x 3 days- and see how it goes  2/10- no drainage seen this AM, but will con't to monitor- WBC is better at 5.9k  2/13- stoma closed after silver nitrate, can use bandaid or no dressing  2/14- closed/healed  14.  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  Lyrica 50 mg TID_ explained won't help numbness/weakness, only nerve pain.  2/7- helping nerve pain a lot- con't regimen  2/16- added Duloxetine 30 mg nightly and Lyrica now 75 mg TID- not enough per pt- trying to see if Duloxetine will kick in.   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 Muscle spasms  2/8- will  try Skelaxin 800 mg TID prn for muscle spasms- since c/o severe muscle tightness  2/9- didn't complain of muscle spasms today 17. Previous HTN  2/11- refusing BP meds- will stop  2/19-20: hypotensive- continue to monitor.  18. Insomnia  2/11- stopped Seroquel per pt request 19. Loose stools  2/15- make colace prn  2/17- doing better-  more regular- not loose- con't regimen 20. Bladder accidents/urgency/incontinence  2/21- Oxybutynin with the meds he's on, will dry him out too much- will try Myrbetriq for now 25 mg daily.   2/22- no improvement so far, but just started meds.       LOS: 27 days A FACE TO FACE EVALUATION WAS PERFORMED  Megan Lovorn 01/14/2021, 9:28 AM

## 2021-01-15 NOTE — Plan of Care (Signed)
  Problem: RH Stairs Goal: LTG Patient will ambulate up and down stairs w/assist (PT) Description: LTG: Patient will ambulate up and down # of stairs with assistance (PT) Flowsheets (Taken 01/15/2021 1225) LTG: Pt will ambulate up/down stairs assist needed:: (added goal due to progress) Moderate Assistance - Patient 50 - 74% LTG: Pt will  ambulate up and down number of stairs: 7 stairs with one handrail laterally Note: Added goal due to progress

## 2021-01-15 NOTE — Progress Notes (Addendum)
Physical Therapy Session Note  Patient Details  Name: Jackson Figueroa MRN: [id number] Date of Birth: 1961-08-11  Today's Date: 01/15/2021 PT Individual Time: 0900-1000 PT Individual Time Calculation (min): 60 min   Short Term Goals: Week 4:  PT Short Term Goal 1 (Week 4): =LTG due to ELOS  Skilled Therapeutic Interventions/Progress Updates:    Pt received sidelying in bed, agreeable to PT session. Pt reports ongoing nerve pain in RUE, not rated and declines intervention. Supine to sit with Supervision. Sit to stand with mod A to RW. Ambulation 4 x 100 ft with RW and CGA to min A for balance with close w/c follow from a 2nd person for safety. Alternating L/R 6" step-ups with B handrails and up to mod A required for balance at times, 2 x 5 reps B, 1 x 5 reps with LLE only for final round due to RLE fatigue. Pt exhibits heavy reliance on BUE support in order to ascend 6" step with RLE first and does not achieve full knee extension. Stand pivot transfer w/c to/from real bed in simulation apartment (26" height) with RW and min A for balance. Sit to/from supine on real bed at Supervision level. Squat pivot transfer back to hospital bed with CGA from w/c. Pt left seated EOB in room with needs in reach, bed alarm in place at end of session.  Therapy Documentation Precautions:  Precautions Precautions: Fall Precaution Comments: PEG, sacral and toe wounds Restrictions Weight Bearing Restrictions: No    Therapy/Group: Individual Therapy   Excell Seltzer, PT, DPT  01/15/2021, 12:03 PM

## 2021-01-15 NOTE — Progress Notes (Signed)
Physical Therapy Note  Patient Details  Name: Jackson Figueroa MRN: [id number] Date of Birth: 05/28/1961 Today's Date: 01/15/2021    Recommending the following equipment to increase functional independence with mobility: ~ Standard lightweight manual wheelchair 20x20   Patient measurements:    Hip Width: 19"    Seat Depth (-2"): 22.5"    Back Height: 24"   W/c cushion: 20x20 foam cushion, back cushion    ~ RW   Excell Seltzer, PT, DPT  01/15/2021, 12:15 PM

## 2021-01-15 NOTE — Progress Notes (Signed)
Occupational Therapy Session Note  Patient Details  Name: Jackson Figueroa MRN: [id number] Date of Birth: January 26, 1961  Today's Date: 01/15/2021 OT Individual Time: 1519-1600 OT Individual Time Calculation (min): 41 min    Short Term Goals: Week 3:  OT Short Term Goal 1 (Week 3): Pt will be able to tolerate standing in [place] stedy for at least 3 minutes to demonstrate improved endurance. OT Short Term Goal 1 - Progress (Week 3): Progressing toward goal OT Short Term Goal 2 (Week 3): Pt will doff/don pants utilizing lateral leans while seated EOB or on BSC with min A OT Short Term Goal 2 - Progress (Week 3): Met OT Short Term Goal 3 (Week 3): Pt will perform DABSC transfer with CGA OT Short Term Goal 3 - Progress (Week 3): Met OT Short Term Goal 4 (Week 3): Pt will perform toilet hygiene with min A OT Short Term Goal 4 - Progress (Week 3): Progressing toward goal OT Short Term Goal 5 (Week 3): Pt will perform TTB transfer with min A Week 4:  OT Short Term Goal 1 (Week 4): Pt will be able to tolerate standing in [place] stedy for at least 3 minutes to demonstrate improved endurance. OT Short Term Goal 1 - Progress (Week 4): Met OT Short Term Goal 2 (Week 4): Pt will perform toilet hygiene with min A OT Short Term Goal 2 - Progress (Week 4): Met Week 5:  OT Short Term Goal 1 (Week 5): STG=LTG 2/2 ELOS  Skilled Therapeutic Interventions/Progress Updates:    Pt greeted at time of session supine in bed resting but agreeable to OT sessoin, c/o fatigue from previous sessions and work out yesterday but no pain. Supine > sit Supervision and squat pivot to wheelchair CGA/close supervision. Therapist transport to gym for time management. C/o ulnar nerve and R hand pain recently, moist heat applied to R hand and wrist for approx 8 minutes with skin in tact pre and post. Soft tissue massage for R forearm flexors progressing distally, PROM for wrist flexion/extension/circumduction, ulnar nerve glides and static holds  for wrist and digit ROM. No pain reported throughout. Transported back to room same manner as above, squat pivot with CGA. Alarm on call bell in reach. Discussed DC planning for next week throughout session.   Therapy Documentation Precautions:  Precautions Precautions: Fall Precaution Comments: PEG, sacral and toe wounds Restrictions Weight Bearing Restrictions: No     Therapy/Group: Individual Therapy  Viona Gilmore 01/15/2021, 4:04 PM

## 2021-01-16 LAB — BASIC METABOLIC PANEL
Anion gap: 9 (ref 5–15)
BUN: 27 mg/dL — ABNORMAL HIGH (ref 6–20)
CO2: 25 mmol/L (ref 22–32)
Calcium: 9.1 mg/dL (ref 8.9–10.3)
Chloride: 104 mmol/L (ref 98–111)
Creatinine, Ser: 1.59 mg/dL — ABNORMAL HIGH (ref 0.61–1.24)
GFR, Estimated: 50 mL/min — ABNORMAL LOW (ref >60)
Glucose, Bld: 83 mg/dL (ref 70–99)
Potassium: 4.2 mmol/L (ref 3.5–5.1)
Sodium: 138 mmol/L (ref 135–145)

## 2021-01-16 NOTE — Progress Notes (Signed)
Physical Therapy Session Note  Patient Details  Name: Jackson Figueroa MRN: [id number] Date of Birth: 1961/11/07  Today's Date: 01/16/2021 PT Individual Time:  1035-1100 and 1630-1724 25 min 82  Short Term Goals: Week 4:  PT Short Term Goal 1 (Week 4): =LTG due to ELOS  Skilled Therapeutic Interventions/Progress Updates:   Session 1.  Pt received sitting in WC and agreeable to PT. WC mobility through hall 2 x 165f with distant supervision assist from PT with min cues for WC parts management only. Gait training x 852fwith cues for posture and step width intermittently. Patient returned to room and [facility] reach and all needs met.     Session 2.  Pt received sitting in EOB and agreeable to PT. Squat pivot [facility] to [facility]. WC mobility with supervision assist through hall x [id number] then over unlevel cement sidewalk x [id number] cues for improved technique to manage uphill grade. Gait training with RW over cement sidewalk x [id number] CGA from PT and cues for increased step height to prevent foot drag. Sit<>stand transfers with supervision assist x 1 and min assist x 1 due to fatigue.  Pt left sitting in WC with call bell in reach and all needs met.           Therapy Documentation Precautions:  Precautions Precautions: Fall Precaution Comments: PEG, sacral and toe wounds Restrictions Weight Bearing Restrictions: No    Pain: Pain Assessment Pain Scale: 0-10 Pain Score: 5  Pain Type: Acute pain Pain Location: Knee Pain Orientation: Right;Left Pain Descriptors / Indicators: Aching Pain Frequency: Occasional Pain Onset: On-going Pain Intervention(s): Medication (See eMAR)    Therapy/Group: Individual Therapy  AustiLorie Phenix/2022, 8:07 AM

## 2021-01-16 NOTE — Progress Notes (Signed)
Occupational Therapy Session Note  Patient Details  Name: Marsel Gail MRN: [id number] Date of Birth: 08/27/1961  Today's Date: 01/16/2021 OT Individual Time: 8250-0370 OT Individual Time Calculation (min): 57 min    Short Term Goals: Week 5:  OT Short Term Goal 1 (Week 5): STG=LTG 2/2 ELOS  Skilled Therapeutic Interventions/Progress Updates:    Pt greeted at time of session supine in bed resting with girlfriend [name] present, who remained throughout session. Supine > sit Supervision and CGA for squat pivot to wheelchair. Discussion with pt/gf at beginning of session regarding home set up and DC planning for home. When leaving room for gym, patient's girlfriend stating she wanted to join and when explained covid policy for having family members remain outside of gym areas, pt stating she was hear for family education and to be present for all sessions. Change of focus for session to family ed and pt walked from wheelchair to bathroom CGA with Min for sit> stand and transferred to/from [facility] over toilet, girlfriend providing CGA back to wheelchair. Brought to therapy gym total A for time management and pt performed further functional mobility with RW with girlfriend providing CGA throughout kitchen and apartment in prep for DC home. Pt performed TTB transfer in ADL apartment close supervision/CGA with girlfriend providing assist, all to improve comfort for DC home and participation in family education. Pt transported back to room and squat pivot to bed CGA. Alarm on call bell in reach.    Therapy Documentation Precautions:  Precautions Precautions: Fall Precaution Comments: PEG, sacral and toe wounds Restrictions Weight Bearing Restrictions: No     Therapy/Group: Individual Therapy  Viona Gilmore 01/16/2021, 2:47 PM

## 2021-01-16 NOTE — Progress Notes (Signed)
Physical Therapy Session Note  Patient Details  Name: Jackson Figueroa MRN: [id number] Date of Birth: 12-11-1960  Today's Date: 01/16/2021 PT Individual Time: 0800-0900 PT Individual Time Calculation (min): 60 min   Short Term Goals: Week 1:  PT Short Term Goal 1 (Week 1): Patient will be able to transition sit <> supine with CGA PT Short Term Goal 1 - Progress (Week 1): Progressing toward goal PT Short Term Goal 2 (Week 1): Patient will be able to transfer with LRAD and MaxA PT Short Term Goal 2 - Progress (Week 1): Met PT Short Term Goal 3 (Week 1): Patient will tolerate standing with LRAD for >1 min PT Short Term Goal 3 - Progress (Week 1): Partly met Week 2:  PT Short Term Goal 1 (Week 2): Pt will tolerate standing x 5 min with LRAD PT Short Term Goal 1 - Progress (Week 2): Partly met PT Short Term Goal 2 (Week 2): Pt will complete least restrictive transfer with mod A consistently PT Short Term Goal 2 - Progress (Week 2): Met PT Short Term Goal 3 (Week 2): Pt will initiate gait training as safe and able PT Short Term Goal 3 - Progress (Week 2): Partly met Week 3:  PT Short Term Goal 1 (Week 3): Pt will be able to perform sit <> stands with max assist with LRAD PT Short Term Goal 1 - Progress (Week 3): Met PT Short Term Goal 2 (Week 3): Pt will be able to gait with lift equipment x 20' PT Short Term Goal 2 - Progress (Week 3): Met PT Short Term Goal 3 (Week 3): Pt will be able to perform bed <> w/c transfers without use of lift equipment with min/mod assist PT Short Term Goal 3 - Progress (Week 3): Met  Skilled Therapeutic Interventions/Progress Updates:    PAIN   Denies pain this am  Pt seen this am for 60  min session.  Pt initially seated at EOB and agreeable to session  w/focus on:   functional mobility, gait, stair progression/training in prep for DC, wc propulsion/endurance.  Pt squat pivot bed to wc w/set up and cga. Pt propels to sink and performs oral care w/set up  assist. Pt transported to gym for continiued session. Sit to stand mult times during session w/min assist, cues for upright posture, additional time.  At times requires second attempt to achieve standing.  Decreased eccentric control w/stand to sit.  Gait 145ft including 2 turns w/cga, wc follow, wobbles at knees but no buckling.  Stairs: Ascended/descended 4 stairs w/single rail lateral approach w/L leg leading ascending and descending, mild buckling x 1 w/descent min to mod assist, 2 person guard for safety.  Repeated stairs as above w/L ascending/R leading descending w/min assist, mild buckling last step.  Repeated stairs as in second trial, no buckling, single cue for foot placement for improved stability.  Gait 3ft w/RW and cga, cues for safety w/ngotiation of transition at [facility] doorway, commode transfer using RW cga including lowering pants.  Pt left seated on commode w/call bell in reach, agreed to call for assist/not get up without assist.  NT notified and agreed to assist pt from this point.    Therapy Documentation Precautions:  Precautions Precautions: Fall Precaution Comments: PEG, sacral and toe wounds Restrictions Weight Bearing Restrictions: No   Therapy/Group: Individual Therapy  Callie Fielding, [place] 01/16/2021, 12:58 PM

## 2021-01-16 NOTE — Progress Notes (Signed)
Recreational Therapy Discharge Summary Patient Details  Name: Jackson Figueroa MRN: [id number] Date of Birth: 04-02-1961 Today's Date: 01/16/2021  Long term goals set: 1  Long term goals met: 1  Comments on progress toward goals: Pt has made excellent progress during LOS and is discharging home on 3/1 with [name], significant other to provide/coordinate supervision/assistance.  TR sessions focused on activity tolerance, balance, w/c mobility, & activity analysis identifying potential modifications.  Goal met.   Reasons for discharge: discharge from hospital  Patient/family agrees with progress made and goals achieved: Yes  SIMPSON,LISA 01/16/2021, 10:35 AM

## 2021-01-16 NOTE — Progress Notes (Signed)
Patient ID: Arsen Mangione, male   DOB: Feb 16, 1961, 60 y.o.   MRN: [id number]  SW received HHA preference: 1) [facility], 2)[facility], 3) [facility], and 4) [facility].   SW sent HHPT/OT referral to Amy/[facility] and waiting on follow-up.  *(declined as out of network benefit with insurance.   SW sent referral to Angie/[facility] and waiting on follow-up.  *referral declined as no contract   SW sent referral to Britney/[facility] and waiting on follow-up.   HHA Denials [facility] [place] [facility]- no contract with [place], MSW, [place] Office: [phone] Cell: [phone] Fax: [phone]

## 2021-01-16 NOTE — Progress Notes (Signed)
[place] PHYSICAL MEDICINE & REHABILITATION PROGRESS NOTE   Subjective/Complaints:  Pt reports LBM yesterday and feels like needs to go today.  Not doing badly, except RUE neuropathy pain.   Wants PEG out- thought it was to come out Friday- will d/w Pam  And see if that can be arranged.   Doesn't want to stay longer due to food and tired of hospital.  Had documented assisted fall today in therapy- I will speak about the fall to pt in am.    ROS:   Pt denies SOB, abd pain, CP, N/V/C/D, and vision changes   Objective:   No results found. No results for input(s): WBC, HGB, HCT, PLT in the last 72 hours. Recent Labs    01/16/21 0520  NA 138  K 4.2  CL 104  CO2 25  GLUCOSE 83  BUN 27*  CREATININE 1.59*  CALCIUM 9.1    Intake/Output Summary (Last 24 hours) at 01/16/2021 1814 Last data filed at 01/16/2021 1300 Gross per 24 hour  Intake 1320 ml  Output 3850 ml  Net -2530 ml        Physical Exam: Vital Signs Blood pressure 116/76, pulse 78, temperature 98.7 F (37.1 C), resp. rate 16, height 6' (1.829 m), weight 110 kg, SpO2 98 %. Gen: sitting up in bed; appropriate, putting on socks, NAD HEENT: oral mucosa pink and moist, NCAT- conjugate gaze Cardio: RRR- no JVD Chest:CTA B/L- no W/R/R- good air movement Abd: Soft, NT, ND, (+)BS   - PEG in place- not PEG I've removed before.  Ext: no edema Psych: pleasant, normal affect- appropriate Ext: no clubbing, cyanosis, Top of R foot trace/slightly puffy- TTP- less TTP Psych: appropriate- no change Skin: Warm and dry.   Sacrum- pictured below--stable- resolved Neuro: Ox3 Right ulnar neuropathy- numbness in lateral 4th/5th digit (more in 5th digit numbness) on R with intrinsic atrophy- exam is consistent- no change Motor: Right upper extremity: Shoulder abduction 4 --4/5, distally 3/5 Left upper extremity: 4/5 proximal distal Bilateral lower extremities: Hip flexion, knee extension 3 -/5, ankle dorsiflexion  4/5    Assessment/Plan: 1. Functional deficits which require 3+ hours per day of interdisciplinary therapy in a comprehensive inpatient rehab setting.  Physiatrist is providing close team supervision and 24 hour management of active medical problems listed below.  Physiatrist and rehab team continue to assess barriers to discharge/monitor patient progress toward functional and medical goals  Care Tool:  Bathing    Body parts bathed by patient: Right arm,Left arm,Chest,Abdomen,Front perineal area,Right upper leg,Left upper leg,Right lower leg,Left lower leg,Face,Buttocks   Body parts bathed by helper: Buttocks     Bathing assist Assist Level: Minimal Assistance - Patient > 75%     Upper Body Dressing/Undressing Upper body dressing   What is the patient wearing?: Pull over shirt    Upper body assist Assist Level: Independent    Lower Body Dressing/Undressing Lower body dressing      What is the patient wearing?: Pants     Lower body assist Assist for lower body dressing: Minimal Assistance - Patient > 75%     Toileting Toileting    Toileting assist Assist for toileting: Dependent - Patient 0% (using bariatric Stedy) Assistive Device Comment: Urinal   Transfers Chair/bed transfer  Transfers assist  Chair/bed transfer activity did not occur: Safety/medical concerns (due to weakness/fatigue)  Chair/bed transfer assist level: Contact Guard/Touching assist     Locomotion Ambulation   Ambulation assist   Ambulation activity did not occur: Safety/medical  concerns (due to weakness/fatigue)  Assist level: 2 helpers Assistive device: Walker-rolling Max distance: 120   Walk 10 feet activity   Assist  Walk 10 feet activity did not occur: Safety/medical concerns (due to weakness/fatigue)  Assist level: 2 helpers Assistive device: Walker-rolling   Walk 50 feet activity   Assist Walk 50 feet with 2 turns activity did not occur: Safety/medical concerns (due  to weakness/fatigue)  Assist level: 2 helpers Assistive device: Walker-rolling    Walk 150 feet activity   Assist Walk 150 feet activity did not occur: Safety/medical concerns (due to weakness/fatigue)  Assist level: 2 helpers Assistive device: Walker-rolling    Walk 10 feet on uneven surface  activity   Assist Walk 10 feet on uneven surfaces activity did not occur: Safety/medical concerns (due to weakness/fatigue)         Wheelchair     Assist Will patient use wheelchair at discharge?: Yes Type of Wheelchair: Manual Wheelchair activity did not occur: Safety/medical concerns (due to weakness/fatigue)  Wheelchair assist level: Supervision/Verbal cueing Max wheelchair distance: 100'    Wheelchair 50 feet with 2 turns activity    Assist    Wheelchair 50 feet with 2 turns activity did not occur: Safety/medical concerns (due to weakness/fatigue)   Assist Level: Supervision/Verbal cueing   Wheelchair 150 feet activity     Assist  Wheelchair 150 feet activity did not occur: Safety/medical concerns (due to weakness/fatigue)   Assist Level: Supervision/Verbal cueing   Blood pressure 116/76, pulse 78, temperature 98.7 F (37.1 C), resp. rate 16, height 6' (1.829 m), weight 110 kg, SpO2 98 %.    Medical Problem List and Plan: 1.  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 Will check.   2/24- PEG to be removed 2/25 based on 8 week requirement             Continue CIR PT, OT 2.  Antithrombotics: -DVT/anticoagulation:  Pharmaceutical: Continue Lovenox             -antiplatelet therapy: N/A 3. Pain Management: tylenol prn  tramadol 50 mg q6 hours prn  Continue Lyrica  Controlled meds on 2/5  2/11- reinforced I'd really like him to take Ibuprofen 1-2x/day- no more- and prefer 1x/day due to elevated  Cr- he voiced understanding  2/14- increased lyrica to 75 mg TID yesterday- but with Cr up to 1.65 cannot increase more- can try Duloxetine 30 mg nightly for nerve pain  2/15- d/w pt- he wanted it increased, but explained can not increase Lyrica right now- explained will take a few days for Duloxetine to help  2/16- Ibuprofen changed to daily prn- will d/w pt again tomorrow most likely cause of kidney fxn getting worse- will see if can stop ibuprofen  2/18- changed to 1x/day Ibuprofen- Cr better to 1.35  2/19: c/o ulnar nerve pain: increase Cymbalta to 40mg .   2/20: pain improved: continue current regimen  2/21 - Cr up to 1.63 again- it's the Ibuprofen- will d/w pt to remove  2/22- stopped Ibuprofen- pt agrees it's likely the cause. Will increase Duloxetine to 60 mg QHS and see if that helps pain- if not, will try Keppra.  2/24- d/w pt- will look into starting Keppra over weekend/Tuesday if no improvement by weekend.   4. Mood: LCSW to follow for evaluation and support.              -  antipsychotic agents: Seroquel 25 2/21- off Seroquel 5. Neuropsych: This patient is capable of making decisions on his own behalf. 6. Skin/Wound Care:                          DTI with MASD: areas on buttocks improving.  Zinc for moisture barrier as well as pressure relief measures.    2/16- on regular bed that can stretch to his height- off specialty bed.  7. Fluids/Electrolytes/Nutrition: Monitor I/O. Encourage fluid intake. Continue to monitor lytes with serial checks.  8. Morbid obesity: Educate on importance of diet and weight loss to help promote overall health. Encourage side lying when in bed.  9. Pre-renal azotemia vs new CKD Stage IIIa: Improving off tube feeds--encourage fluid intake.   2/16- labs tomorrow- Cr issues likely due to Ibuprofen  2/17- Cr down to 1.41- and BUN 20- much improved- con't regimen for now  2/18- Cr down to 1.35- better  2/21- Cr back up to 1.63- is likely ibuprofen- will d/w pt  tomorrow  2/22- d/c ibuprofen  2/24- Cr only down slightly to 1.59- not sure why- he's drinking a lot per pt and staff- will recheck Saturday.  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              Hemoglobin 9.5 on 2/3  overall stable- con't regimen 11. Encephalopathy: Has resolved. Continue Seroquel to help with sleep hygeine.  12.  Dysphagia: Resolved  2/2- PEG was placed 12/30- so cannot be removed yet  2/24- will remove PEG 2/25-2/26 since that's 8 weeks from 12/30 13. Recent trach: Closed  2/9- has some trach site drainage- appears purulent- will start Keflex 500 mg BID (per CrCl) x 3 days- and see how it goes  2/10- no drainage seen this AM, but will con't to monitor- WBC is better at 5.9k  2/13- stoma closed after silver nitrate, can use bandaid or no dressing  2/14- closed/healed  14.  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  Lyrica 50 mg TID_ explained won't help numbness/weakness, only nerve pain.  2/7- helping nerve pain a lot- con't regimen  2/16- added Duloxetine 30 mg nightly and Lyrica now 75 mg TID- not enough per pt- trying to see if Duloxetine will kick in.   2/17- add Lidocaine cream QID prn for R hand  2/18- no improvement with voltaren or lidocaine- with Cr going down, Monday can increase Duloxetine to 60 mg daily  2/24- Duloxetine at 60 mg- hasn't helped so far. 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 Muscle spasms  2/8- will try Skelaxin 800 mg TID prn for muscle spasms- since c/o severe muscle tightness  2/9- didn't complain of muscle spasms today 17. Previous HTN  2/11- refusing BP meds- will stop  2/19-20: hypotensive- continue to monitor.  18.  Insomnia  2/11- stopped Seroquel per pt request 19. Loose stools  2/15- make colace prn  2/17- doing better- more regular- not loose- con't regimen 20. Bladder accidents/urgency/incontinence  2/21- Oxybutynin with the meds he's on, will dry him out too much- will try Myrbetriq for now 25 mg daily.   2/22- no improvement so  far, but just started meds.  21. High fall risk  2/24- had assisted fall in therapy today.     LOS: 29 days A FACE TO FACE EVALUATION WAS PERFORMED  Jacque Byron 01/16/2021, 6:14 PM

## 2021-01-16 NOTE — Progress Notes (Addendum)
Physical Therapy Session Note  Patient Details  Name: Jackson Figueroa MRN: [id number] Date of Birth: 09-28-1961  Today's Date: 01/16/2021 PT Individual Time: 7341-9379 PT Individual Time Calculation (min): 62 min   Short Term Goals: Week 3:  PT Short Term Goal 1 (Week 3): Pt will be able to perform sit <> stands with max assist with LRAD PT Short Term Goal 1 - Progress (Week 3): Met PT Short Term Goal 2 (Week 3): Pt will be able to gait with lift equipment x 20' PT Short Term Goal 2 - Progress (Week 3): Met PT Short Term Goal 3 (Week 3): Pt will be able to perform bed <> w/c transfers without use of lift equipment with min/mod assist PT Short Term Goal 3 - Progress (Week 3): Met  Skilled Therapeutic Interventions/Progress Updates:  Patient supine in bed upon PT arrival with SO present in room. Patient alert and agreeable to PT session. Patient denied pain prior to session. SO states that she is present for education prior to pt discharging home and would like to work on stairs with pt as he has 7 steps to ascend in order to access their home.   Therapeutic Activity: Patient performed supine --> sit with supervision. He is able to bring self to upright seated position using bed controls to elevate HOB and then using bedrail to assist the rest of the way. Pt and SO state that they do have a powered bed at home, however it will not be available to pt upon first arriving home. He will be in an elevated queen size bed on the main floor. Pt is able to donn socks with extra time and shoes with setup assist. Squat pivot to w/c with supervision.    At steps, pt states that he has been training with lateral ascent/ descent with use of BUE to HR and ascending with LLE first. SO instructed in positioning self one step lower than pt. This PT positioned above pt. Pt ascends LLE first and is able to position feet at slight diagonal in order to get good placement of each foot on step. Upon reaching top step, pt  begins descent and on 2nd step, both knees buckle. This PT catches closest knee, however is only able to slow pt's descent safely to step. Once on step, pt's BLE repositioned for comfort. Pt's SO attempts to try to lift pt. This PT requests for her to step to side and allow therapy tech to assist. With Max A +2, this PT and therapy tech guide pt to modified stance and pivot to w/c seat. Pt allowed to rest in seat with improved positioning. States he was in pain on initial positioning in steps upon knees buckling but is in minimal pain now. Using BLE and BUE, he is able to reposition himself in w/c with supervision. He states he would like some water and is returned to his room in w/c.   Squat pivot w/c--> bed with supervision/ CGA. Sit--> supine with CGA. RN located and notified re: assisted fall in therapy gym. Vitals taken by RN:   BP: 116/ 76 Temp 98.7 MAP 89 Pulse 78 Resp rate 16  SO upset as she believes that fall was her fault. SO consoled and educated that she could not have prevented fall. Therapist was available and attempted to catch one knee buckle but could not stop both. Continued strengthening is needed.   SO and pt questioned re: 7 steps to enter the house. There is time available for  addition of ramp. Both state that there is no way to add ramp to steps. Educated that there are good builders out there and encouraged to contact a Museum/gallery curator for a good Agricultural consultant that will work temporarily while a ramp is necessary. Otherwise, a different approach to steps may be needed for plan to d/c home.   Patient supine in bed at end of session with bed alarm set, ice packs to Bil knees, RN and SO present, and all needs within reach.  Therapy Documentation Precautions:  Precautions Precautions: Fall Precaution Comments: PEG, sacral and toe wounds Restrictions Weight Bearing Restrictions: No   Therapy/Group: Individual Therapy  Alger Simons 01/16/2021, 4:59 PM

## 2021-01-16 NOTE — Progress Notes (Signed)
   01/16/21 1621  What Happened  Was fall witnessed? Yes (assisted fall during therapy at gym steps; eased down on floor)  Who witnessed fall? Almyra Free PT/ Crystal PTA  Patients activity before fall during therapy  Point of contact  (knee)  Was patient injured? No  Follow Up  MD notified Pam LOve  Time MD notified 1633  Family notified  (barb decker/ significant other)  Time family notified 1621  Additional tests No  Simple treatment Other (comment) (cleansed /OTA)  Progress note created (see row info) Yes  Adult Fall Risk Assessment  Risk Factor Category (scoring not indicated) Fall has occurred during this admission (document High fall risk)  Age 60  Fall History: Fall within 6 months prior to admission 0  Elimination; Bowel and/or Urine Incontinence 0  Elimination; Bowel and/or Urine Urgency/Frequency 2  Medications: includes PCA/Opiates, Anti-convulsants, Anti-hypertensives, Diuretics, Hypnotics, Laxatives, Sedatives, and Psychotropics 5  Patient Care Equipment 1  Mobility-Assistance 2  Mobility-Gait 2  Mobility-Sensory Deficit 2  Altered awareness of immediate physical environment 1  Impulsiveness 2  Lack of understanding of one's physical/cognitive limitations 0  Total Score 17  Patient Fall Risk Level High fall risk  Adult Fall Risk Interventions  Required Bundle Interventions *See Row Information* High fall risk - low, moderate, and high requirements implemented  Additional Interventions Lap belt while in chair/wheelchair (Rehab only)  Screening for Fall Injury Risk (To be completed on HIGH fall risk patients) - Assessing Need for Floor Mats  Risk For Fall Injury- Criteria for Floor Mats None identified - No additional interventions needed  Vitals  Temp 98.7 F (37.1 C) (taken when patient was in room/ therapist informed RN of the assisted fall)  BP 116/76  MAP (mmHg) 89  BP Location Left Arm  BP Method Automatic  Patient Position (if appropriate) Lying  Pulse  Rate 78  Pulse Rate Source Monitor  Resp 16  Oxygen Therapy  SpO2 98 %  O2 Device Room Air  Pain Assessment  Pain Score 0  Neurological  Neuro (WDL) WDL  Level of Consciousness Alert  Orientation Level Oriented X4  Cognition Appropriate at baseline  Speech Clear  Pupil Assessment  No  R Hand Grip Moderate  L Hand Grip Moderate   R Foot Dorsiflexion Present;Weak  L Foot Dorsiflexion Present;Weak  R Foot Plantar Flexion Moderate  L Foot Plantar Flexion Moderate  RUE Motor Response Purposeful movement  RUE Sensation Full sensation  RUE Motor Strength 4  LUE Motor Response Purposeful movement  LUE Sensation Full sensation  LUE Motor Strength 4  RLE Motor Response Purposeful movement  RLE Sensation Full sensation  RLE Motor Strength 4  LLE Motor Response Purposeful movement  LLE Sensation Full sensation  LLE Motor Strength 4  Neuro Symptoms None  Neuro symptoms relieved by Rest  Musculoskeletal  Musculoskeletal (WDL) X  Assistive Device Wheelchair;Front wheel walker  Generalized Weakness Yes  Weight Bearing Restrictions No  Musculoskeletal Details  RUE Limited movement  RLE Limited movement  Integumentary  Integumentary (WDL) X  Skin Color Appropriate for ethnicity  Skin Condition Dry  Skin Integrity Catheter entry/exit site  Abrasion Location Leg  Abrasion Location Orientation Lower  Abrasion Intervention Cleansed;Other (Comment)  Catheter Entry/Exit Location Abdomen  Catheter Entry/Exit Location Orientation Mid  Catheter Entry/Exit Intervention Gauze  Ecchymosis Location Abdomen  Ecchymosis Location Orientation Bilateral;Lower  Ecchymosis Intervention Other (Comment)  Excoriated Location Buttocks  Excoriated Location Orientation Bilateral  Excoriated Intervention Foam  Skin Turgor Non-tenting

## 2021-01-17 ENCOUNTER — Inpatient Hospital Stay (HOSPITAL_COMMUNITY): Payer: BC Managed Care – PPO

## 2021-01-17 ENCOUNTER — Inpatient Hospital Stay: Payer: BC Managed Care – PPO | Admitting: Nurse Practitioner

## 2021-01-17 HISTORY — PX: IR GASTROSTOMY TUBE REMOVAL: IMG5492

## 2021-01-17 MED ORDER — LIDOCAINE VISCOUS HCL 2 % MT SOLN
OROMUCOSAL | Status: AC | PRN
Start: 2021-01-17 — End: 2021-01-17
  Administered 2021-01-17: 15 mL via OROMUCOSAL

## 2021-01-17 MED ORDER — LIDOCAINE VISCOUS HCL 2 % MT SOLN
OROMUCOSAL | Status: AC
  Filled 2021-01-17: qty 15

## 2021-01-17 NOTE — Progress Notes (Addendum)
Patient ID: Jackson Figueroa, male   DOB: 09-02-1961, 61 y.o.   MRN: [id number]  SW received updates from [facility] reporting referral declined due to out of network.  SW sent referral to Cheryl/[facility] and Meredeth Ide [facility] and waiting on follow-up.  *referral declined.   SW sent referral to [facility] and waiting on follow-up.  *referral declined, plan not in network.   SW sent referral to Cory/[facility], [place]/[facility], Intake at [facility]; p:[phone]/f:[phone]) and Intake at [facility] (p:[phone]/[phone]); and waiting on follow-up.  *SW received message for Lori/[facility] reporting no HHPT/OT; Breanna/[facility] reporting no availability at this time.   HHA Denials [facility]- out of network benefits [facility]- no contract with [place]- out of network [facility] [place] [facility] [place], MSW, [place] Office: [phone] Cell: [phone] Fax: [phone]

## 2021-01-17 NOTE — Progress Notes (Signed)
Occupational Therapy Session Note  Patient Details  Name: Jackson Figueroa MRN: [id number] Date of Birth: 1961/02/09  Today's Date: 01/17/2021 Session 1 OT Individual Time: 7510-2585 OT Individual Time Calculation (min): 56 min   Session 2 OT Individual Time: 2778-2423 OT Individual Time Calculation (min): 45 min    Short Term Goals: Week 1:  OT Short Term Goal 1 (Week 1): Pt will be able to tolerate standing in [employer] stedy for at least 3 minutes to demonstrate improved endurance. OT Short Term Goal 1 - Progress (Week 1): Progressing toward goal OT Short Term Goal 2 (Week 1): Pt will be able to don and doff pants over hips using a bridge in bed from supine. OT Short Term Goal 2 - Progress (Week 1): Progressing toward goal OT Short Term Goal 3 (Week 1): Pt will be able to reach R arm back behind his hips to be able to adjust pants and increase potential for full cleansing. OT Short Term Goal 3 - Progress (Week 1): Progressing toward goal OT Short Term Goal 4 (Week 1): Pt will don shirt with min A. OT Short Term Goal 4 - Progress (Week 1): Met  Skilled Therapeutic Interventions/Progress Updates:    Session 1 Pt greeted seated EOB and agreeable to OT treatment session. Pt reported pain in L knee that is painful with weight bearing. Pt opted for slideboard transfer which pt was able to do mod I. Pt then propelled wc to the sink for grooming tasks without OT assistance. Pt propelled wc to therapy gym and worked on gentle ROM of L knee. PT completted slideboard transfer to therapy mat and wash cloth paced under L foot to allow foot to slide across floor for knee flexion/extension exercises. Pt reported some improvement with pain as knee was "loosened up." Pt then completed 3 sets of 10 1/2 squats from therapy mat. Pt able to clear bottom from mat 75% of the time. Pt returned to room and completed lateral scoot back to bed with supervision. Pt left seated EOB with needs met.   Session 2 Pt greeted  seated EOB with girlfriend present and agreeable to OT treatment session. Pt still reports pain and swelling in L knee after fall yesterday. Pt completed lateral scoot over to wc with supervision. Pt propelled wc to therapy gym and worked on sit<>stands at [employer] table. Pt initially unable to stand after 4 trials and max A. OT had pt push up with  Both hands on arm rests with improved power up, but still needing mod A, knee block and facilitation at hips to achieve full hip/trunk extensions. Pt completed 5 sit<>stands this way. Worked on maintaining standfing for 30 seconds, up to 1 minute at last standing bout. Pt returned to room and completed lateral scoot back to bed with close supervision. Pt returned to supine and OT placed ice bags on knees. Pt left with girlfriend present and needs met.    Therapy Documentation Precautions:  Precautions Precautions: Fall Precaution Comments: PEG, sacral and toe wounds Restrictions Weight Bearing Restrictions: No Pain:  no number given. Pain in L knee when weight bearing, rest, repositioned and ice for pain management   Therapy/Group: Individual Therapy  Valma Cava 01/17/2021, 3:04 PM

## 2021-01-17 NOTE — Plan of Care (Signed)
  Problem: RH SKIN INTEGRITY Goal: RH STG SKIN FREE OF INFECTION/BREAKDOWN Description: Skin free of breakdown and infection with min assist to supervision assistance. Outcome: Progressing Goal: RH STG ABLE TO PERFORM INCISION/WOUND CARE W/ASSISTANCE Description: STG Able To Perform Incision/Wound Care With min Assistance to supervision assistance. Outcome: Progressing   Problem: RH KNOWLEDGE DEFICIT GENERAL Goal: RH STG INCREASE KNOWLEDGE OF SELF CARE AFTER HOSPITALIZATION Outcome: Progressing   Problem: Consults Goal: RH GENERAL PATIENT EDUCATION Description: See Patient Education module for education specifics. Outcome: Progressing Goal: Skin Care Protocol Initiated - if Braden Score 18 or less Description: If consults are not indicated, leave blank or document N/A Outcome: Progressing

## 2021-01-17 NOTE — Progress Notes (Signed)
[facility] PHYSICAL MEDICINE & REHABILITATION PROGRESS NOTE   Subjective/Complaints:  Pt reports wants PEG out today if possible- Also fell with an assisted fall to ground yesterday- knee that's unstable since football injury, happens occasionally.   In so much pain this AM, in knees, did a transfer board transfer this Am with OT to get into w/c.      ROS:   Pt denies SOB, abd pain, CP, N/V/C/D, and vision changes   Objective:   IR GASTROSTOMY TUBE REMOVAL/REPAIR  Result Date: 01/17/2021 INDICATION: History of hypoxic respiratory failure related to COVID pneumonia requiring trach and gastrostomy tube for long-term support. He has progressed with rehabilitation and is no longer in need of his gastrostomy tube. Request is made for removal. EXAM: BEDSIDE REMOVAL OF GASTROSTOMY TUBE COMPARISON:  None. MEDICATIONS: 10 mL viscous lidocaine CONTRAST:  None FLUOROSCOPY TIME:  None COMPLICATIONS: None immediate. PROCEDURE: A time-out was performed prior to the initiation of the procedure. The track of the existing gastrostomy tube was lubricated with viscous lidocaine. Using manual traction, the existing pull-through gastrostomy tube was removed intact. A dressing was placed. The patient tolerated the procedure well without immediate postprocedural complication. IMPRESSION: Successful bedside removal of pull-through gastrostomy tube without complication. Read by: Brynda Greathouse PA-C Electronically Signed   By: Markus Daft M.D.   On: 01/17/2021 10:36   No results for input(s): WBC, HGB, HCT, PLT in the last 72 hours. Recent Labs    01/16/21 0520  NA 138  K 4.2  CL 104  CO2 25  GLUCOSE 83  BUN 27*  CREATININE 1.59*  CALCIUM 9.1    Intake/Output Summary (Last 24 hours) at 01/17/2021 1508 Last data filed at 01/17/2021 1338 Gross per 24 hour  Intake 1040 ml  Output 2952 ml  Net -1912 ml        Physical Exam: Vital Signs Blood pressure 117/87, pulse 74, temperature 97.7 F (36.5  C), resp. rate 17, height 6' (1.829 m), weight 110 kg, SpO2 98 %. Gen: up in manual w/c, with OT, NAD HEENT: oral mucosa pink and moist, NCAT- conjugate gaze Cardio: RRR- no JVD Chest:CTA B/L- no W/R/R- good air movement Abd: Soft, NT, ND, (+)BS     - PEG in place- not PEG I've removed before.  Ext: no edema Psych: appropriate Ext: no clubbing, cyanosis, Top of R foot trace/slightly puffy- TTP- less TTP Psych: appropriate- no change Skin: Warm and dry.   Sacrum- pictured below--stable- resolved Neuro: Ox3 Right ulnar neuropathy- numbness in lateral 4th/5th digit (more in 5th digit numbness) on R with intrinsic atrophy- exam is consistent- no change Motor: Right upper extremity: Shoulder abduction 4 --4/5, distally 3/5 Left upper extremity: 4/5 proximal distal Bilateral lower extremities: Hip flexion, knee extension 3 -/5, ankle dorsiflexion 4/5    Assessment/Plan: 1. Functional deficits which require 3+ hours per day of interdisciplinary therapy in a comprehensive inpatient rehab setting.  Physiatrist is providing close team supervision and 24 hour management of active medical problems listed below.  Physiatrist and rehab team continue to assess barriers to discharge/monitor patient progress toward functional and medical goals  Care Tool:  Bathing    Body parts bathed by patient: Right arm,Left arm,Chest,Abdomen,Front perineal area,Right upper leg,Left upper leg,Right lower leg,Left lower leg,Face,Buttocks   Body parts bathed by helper: Buttocks     Bathing assist Assist Level: Minimal Assistance - Patient > 75%     Upper Body Dressing/Undressing Upper body dressing   What is the patient wearing?: Pull  over shirt    Upper body assist Assist Level: Independent    Lower Body Dressing/Undressing Lower body dressing      What is the patient wearing?: Pants     Lower body assist Assist for lower body dressing: Minimal Assistance - Patient > 75%      Toileting Toileting    Toileting assist Assist for toileting: Dependent - Patient 0% (using bariatric Stedy) Assistive Device Comment: Urinal   Transfers Chair/bed transfer  Transfers assist  Chair/bed transfer activity did not occur: Safety/medical concerns (due to weakness/fatigue)  Chair/bed transfer assist level: Contact Guard/Touching assist     Locomotion Ambulation   Ambulation assist   Ambulation activity did not occur: Safety/medical concerns (due to weakness/fatigue)  Assist level: 2 helpers Assistive device: Walker-rolling Max distance: 120   Walk 10 feet activity   Assist  Walk 10 feet activity did not occur: Safety/medical concerns (due to weakness/fatigue)  Assist level: 2 helpers Assistive device: Walker-rolling   Walk 50 feet activity   Assist Walk 50 feet with 2 turns activity did not occur: Safety/medical concerns (due to weakness/fatigue)  Assist level: 2 helpers Assistive device: Walker-rolling    Walk 150 feet activity   Assist Walk 150 feet activity did not occur: Safety/medical concerns (due to weakness/fatigue)  Assist level: 2 helpers Assistive device: Walker-rolling    Walk 10 feet on uneven surface  activity   Assist Walk 10 feet on uneven surfaces activity did not occur: Safety/medical concerns (due to weakness/fatigue)         Wheelchair     Assist Will patient use wheelchair at discharge?: Yes Type of Wheelchair: Manual Wheelchair activity did not occur: Safety/medical concerns (due to weakness/fatigue)  Wheelchair assist level: Supervision/Verbal cueing Max wheelchair distance: 100'    Wheelchair 50 feet with 2 turns activity    Assist    Wheelchair 50 feet with 2 turns activity did not occur: Safety/medical concerns (due to weakness/fatigue)   Assist Level: Supervision/Verbal cueing   Wheelchair 150 feet activity     Assist  Wheelchair 150 feet activity did not occur: Safety/medical  concerns (due to weakness/fatigue)   Assist Level: Supervision/Verbal cueing   Blood pressure 117/87, pulse 74, temperature 97.7 F (36.5 C), resp. rate 17, height 6' (1.829 m), weight 110 kg, SpO2 98 %.    Medical Problem List and Plan: 1.  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 Will check.   2/24- PEG to be removed 2/25 based on 8 week requirement  2/25- trying to get IR to remove PEG today- removed at bedside             Continue CIR PT, OT 2.  Antithrombotics: -DVT/anticoagulation:  Pharmaceutical: Continue Lovenox             -antiplatelet therapy: N/A 3. Pain Management: tylenol prn  tramadol 50 mg q6 hours prn  Continue Lyrica  Controlled meds on 2/5  2/11- reinforced I'd really like him to take Ibuprofen 1-2x/day- no more- and prefer 1x/day due to elevated Cr- he voiced understanding  2/14- increased lyrica to 75 mg TID yesterday- but with Cr up to 1.65 cannot increase more- can try Duloxetine 30 mg nightly for nerve pain  2/15- d/w pt- he wanted it increased, but explained can not increase Lyrica right now- explained will take a few days for Duloxetine to help  2/16- Ibuprofen changed to daily prn- will d/w pt again tomorrow most likely cause of kidney fxn getting worse- will see if can stop ibuprofen  2/18- changed to 1x/day Ibuprofen- Cr better to 1.35  2/19: c/o ulnar nerve pain: increase Cymbalta to 40mg .   2/20: pain improved: continue current regimen  2/21 - Cr up to 1.63 again- it's the Ibuprofen- will d/w pt to remove  2/22- stopped Ibuprofen- pt agrees it's likely the cause. Will increase Duloxetine to 60 mg QHS and see if that helps pain- if not, will try Keppra.  2/24- d/w pt- will look into starting Keppra over weekend/Tuesday if no improvement by weekend.   2/25- will start keppra this weekend- no  change with duloxetine so far.   4. Mood: LCSW to follow for evaluation and support.              -antipsychotic agents: Seroquel 25 2/21- off Seroquel 5. Neuropsych: This patient is capable of making decisions on his own behalf. 6. Skin/Wound Care:                          DTI with MASD: areas on buttocks improving.  Zinc for moisture barrier as well as pressure relief measures.    2/16- on regular bed that can stretch to his height- off specialty bed.  7. Fluids/Electrolytes/Nutrition: Monitor I/O. Encourage fluid intake. Continue to monitor lytes with serial checks.  8. Morbid obesity: Educate on importance of diet and weight loss to help promote overall health. Encourage side lying when in bed.  9. Pre-renal azotemia vs new CKD Stage IIIa: Improving off tube feeds--encourage fluid intake.   2/16- labs tomorrow- Cr issues likely due to Ibuprofen  2/17- Cr down to 1.41- and BUN 20- much improved- con't regimen for now  2/18- Cr down to 1.35- better  2/21- Cr back up to 1.63- is likely ibuprofen- will d/w pt tomorrow  2/22- d/c ibuprofen  2/24- Cr only down slightly to 1.59- not sure why- he's drinking a lot per pt and staff- will recheck Saturday.   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              Hemoglobin 9.5 on 2/3  overall stable- con't regimen 11. Encephalopathy: Has resolved. Continue Seroquel to help with sleep hygeine.  12.  Dysphagia: Resolved  2/2- PEG was placed 12/30- so cannot be removed yet  2/24- will remove PEG 2/25-2/26 since that's 8 weeks from 12/30  2/25- PEG removed today 13. Recent trach: Closed  2/9- has some trach site drainage- appears purulent- will start Keflex 500 mg BID (per CrCl) x 3 days- and see how it goes  2/10- no drainage seen this AM, but will con't to monitor- WBC is better at 5.9k  2/13- stoma closed after silver nitrate, can use bandaid or no dressing  2/14- closed/healed  14.  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  Lyrica 50 mg TID_ explained won't help numbness/weakness, only nerve pain.  2/7- helping nerve pain a lot- con't regimen  2/16- added Duloxetine 30 mg nightly and Lyrica now 75 mg TID- not enough per pt- trying to see if Duloxetine will kick in.   2/17- add Lidocaine cream QID prn for R hand  2/18- no improvement with voltaren or lidocaine- with Cr going down, Monday can increase Duloxetine to 60 mg daily  2/24- Duloxetine  at 60 mg- hasn't helped so far. 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 Muscle spasms  2/8- will try Skelaxin 800 mg TID prn for muscle spasms- since c/o severe muscle tightness  2/9- didn't complain of muscle spasms today 17. Previous HTN  2/11- refusing BP meds- will stop  2/19-20: hypotensive- continue to monitor.  18. Insomnia  2/11- stopped Seroquel per pt request 19. Loose stools  2/15- make colace prn  2/17- doing better- more regular- not loose- con't regimen 20. Bladder accidents/urgency/incontinence  2/21- Oxybutynin with the meds he's on, will dry him out too much- will try Myrbetriq for now 25 mg daily.   2/22- no improvement so far, but just started meds.   2/25- says is somewhat better- con't regimen 21. High fall risk  2/24- had assisted fall in therapy today.     LOS: 30 days A FACE TO FACE EVALUATION WAS PERFORMED  Reeya Bound 01/17/2021, 3:08 PM

## 2021-01-17 NOTE — Progress Notes (Signed)
Occupational Therapy Session Note  Patient Details  Name: Jackson Figueroa MRN: [id number] Date of Birth: 1961/04/26  Today's Date: 01/17/2021 OT Group Time: 1115-1200 OT Group Time Calculation (min): 45 min    Short Term Goals: Week 1:  OT Short Term Goal 1 (Week 1): Pt will be able to tolerate standing in [place] stedy for at least 3 minutes to demonstrate improved endurance. OT Short Term Goal 1 - Progress (Week 1): Progressing toward goal OT Short Term Goal 2 (Week 1): Pt will be able to don and doff pants over hips using a bridge in bed from supine. OT Short Term Goal 2 - Progress (Week 1): Progressing toward goal OT Short Term Goal 3 (Week 1): Pt will be able to reach R arm back behind his hips to be able to adjust pants and increase potential for full cleansing. OT Short Term Goal 3 - Progress (Week 1): Progressing toward goal OT Short Term Goal 4 (Week 1): Pt will don shirt with min A. OT Short Term Goal 4 - Progress (Week 1): Met  Skilled Therapeutic Interventions/Progress Updates:    Pt participated in rhythmic drumming group. Focus of group on BUE coordination, strengthening, endurance, timing/control, activity tolerance, and social participation and engagement. Pt performs session from seated position for energy conservation. Skilled interventions included adding foam handle to L drumstick to improve grip & using 1 drumstick for AAROM . Warm up performed prior to exercises and UB stretching completed at end of group with demo from OT. Pt able to select preferred song to share with group. Returned pt to room at end of session. Exited session with pt seated in bed, exit alarm on and call light in reach. Pain in L knee noted and ice provided at end of session.  Therapy Documentation Precautions:  Precautions Precautions: Fall Precaution Comments: PEG, sacral and toe wounds Restrictions Weight Bearing Restrictions: No General:   Vital Signs: Therapy Vitals Temp: 97.9 F (36.6  C) Pulse Rate: 76 Resp: 16 BP: 113/76 Patient Position (if appropriate): Lying Oxygen Therapy SpO2: 97 % O2 Device: Room Air Pain:   ADL: ADL Eating: Set up Grooming: Setup Where Assessed-Grooming: Edge of bed Upper Body Bathing: Setup Where Assessed-Upper Body Bathing: Edge of bed Lower Body Bathing: Maximal assistance Where Assessed-Lower Body Bathing: Bed level Upper Body Dressing: Minimal assistance Where Assessed-Upper Body Dressing: Edge of bed Lower Body Dressing: Maximal assistance Where Assessed-Lower Body Dressing: Bed level Vision   Perception    Praxis   Exercises:   Other Treatments:     Therapy/Group: Group Therapy  [name] 01/17/2021, 6:43 AM

## 2021-01-17 NOTE — Progress Notes (Signed)
Physical Therapy Missed Visit Note  Patient Details  Name: Jackson Figueroa MRN: [id number] Date of Birth: Jun 20, 1961 Today's Date: 01/17/2021    Pt off the unit for test during scheduled 60 min treatment session. Callie Fielding, PT    Jerrilyn Cairo 01/17/2021, 3:12 PM

## 2021-01-18 LAB — BASIC METABOLIC PANEL
Anion gap: 8 (ref 5–15)
BUN: 22 mg/dL — ABNORMAL HIGH (ref 6–20)
CO2: 27 mmol/L (ref 22–32)
Calcium: 9.3 mg/dL (ref 8.9–10.3)
Chloride: 104 mmol/L (ref 98–111)
Creatinine, Ser: 1.44 mg/dL — ABNORMAL HIGH (ref 0.61–1.24)
GFR, Estimated: 57 mL/min — ABNORMAL LOW (ref >60)
Glucose, Bld: 85 mg/dL (ref 70–99)
Potassium: 4.1 mmol/L (ref 3.5–5.1)
Sodium: 139 mmol/L (ref 135–145)

## 2021-01-18 NOTE — Plan of Care (Signed)
  Problem: RH SKIN INTEGRITY Goal: RH STG SKIN FREE OF INFECTION/BREAKDOWN Description: Skin free of breakdown and infection with min assist to supervision assistance. Outcome: Progressing Goal: RH STG ABLE TO PERFORM INCISION/WOUND CARE W/ASSISTANCE Description: STG Able To Perform Incision/Wound Care With min Assistance to supervision assistance. Outcome: Progressing   Problem: RH KNOWLEDGE DEFICIT GENERAL Goal: RH STG INCREASE KNOWLEDGE OF SELF CARE AFTER HOSPITALIZATION Outcome: Progressing   Problem: Consults Goal: RH GENERAL PATIENT EDUCATION Description: See Patient Education module for education specifics. Outcome: Progressing Goal: Skin Care Protocol Initiated - if Braden Score 18 or less Description: If consults are not indicated, leave blank or document N/A Outcome: Progressing

## 2021-01-19 NOTE — Progress Notes (Signed)
Physical Therapy Session Note  Patient Details  Name: Jackson Figueroa MRN: [id number] Date of Birth: Apr 01, 1961  Today's Date: 01/19/2021 PT Individual Time: 1625-1720 PT Individual Time Calculation (min): 55 min   Short Term Goals: Week 4:  PT Short Term Goal 1 (Week 4): =LTG due to ELOS  Skilled Therapeutic Interventions/Progress Updates:    Pt received seated in w/c in room, agreeable to PT session. Pt reports pain in B knees at rest from controlled fall on stairs last week. Use of voltarin gel on knees prior to start of session and ice packs at end of session for pain management. Goal of session to problem solve safe method for pt to navigate stairs in/out of home. Dependent transport via w/c to/from therapy gym for time conservation. Demonstrated how to scoot up on stairs on bottom going backwards but discussed difficulty with standing back up from the ground if stairs are navigated via this method. Also demonstrated how to navigate stairs via shower chair method, however due to pt's height he is unable to stand up from shower chair seat to stair railing. Squat pivot transfer w/c to/from mat table with CGA. Sit to stand x 5 reps to RW from elevated mat table with max A and B knees blocked. Pt initially unable to fully extend knees in standing due to pain, improves with each repetition but still unable to fully extend L knee due to edema and pain. Sitting EOM to long-sit to prone with min A. Pt unable to attain quadruped position needed in order to get up a chair from the ground, therefore would be unable to scoot up stairs on bottom as he would be unable to get back up at the top. Discussed that therapy highly recommends a ramp, pt continues to state a ramp would not work in his situation. Also discussed that pt could navigate stairs dependently via w/c but would need two able-bodied people to assist him, pt does not have this level of assist at home. Will attempt walking up stairs again next session to  determine safety of this method. Pt left seated in w/c in room with needs in reach, ice packs to B knees at end of session. Pt also declines possibly extending LOS to return to level of function he was at prior to controlled fall last week.  Therapy Documentation Precautions:  Precautions Precautions: Fall Precaution Comments: PEG, sacral and toe wounds Restrictions Weight Bearing Restrictions: No   Therapy/Group: Individual Therapy   Excell Seltzer, PT, DPT  01/19/2021, 5:43 PM

## 2021-01-19 NOTE — Plan of Care (Signed)
  Problem: RH SKIN INTEGRITY Goal: RH STG SKIN FREE OF INFECTION/BREAKDOWN Description: Skin free of breakdown and infection with min assist to supervision assistance. Outcome: Progressing Goal: RH STG ABLE TO PERFORM INCISION/WOUND CARE W/ASSISTANCE Description: STG Able To Perform Incision/Wound Care With min Assistance to supervision assistance. Outcome: Progressing   Problem: RH KNOWLEDGE DEFICIT GENERAL Goal: RH STG INCREASE KNOWLEDGE OF SELF CARE AFTER HOSPITALIZATION Outcome: Progressing   Problem: Consults Goal: RH GENERAL PATIENT EDUCATION Description: See Patient Education module for education specifics. Outcome: Progressing Goal: Skin Care Protocol Initiated - if Braden Score 18 or less Description: If consults are not indicated, leave blank or document N/A Outcome: Progressing

## 2021-01-20 LAB — CBC WITH DIFFERENTIAL/PLATELET
Abs Immature Granulocytes: 0.03 10*3/uL (ref 0.00–0.07)
Basophils Absolute: 0 10*3/uL (ref 0.0–0.1)
Basophils Relative: 1 %
Eosinophils Absolute: 0.3 10*3/uL (ref 0.0–0.5)
Eosinophils Relative: 5 %
HCT: 28.4 % — ABNORMAL LOW (ref 39.0–52.0)
Hemoglobin: 9.2 g/dL — ABNORMAL LOW (ref 13.0–17.0)
Immature Granulocytes: 1 %
Lymphocytes Relative: 30 %
Lymphs Abs: 1.6 10*3/uL (ref 0.7–4.0)
MCH: 32.1 pg (ref 26.0–34.0)
MCHC: 32.4 g/dL (ref 30.0–36.0)
MCV: 99 fL (ref 80.0–100.0)
Monocytes Absolute: 0.6 10*3/uL (ref 0.1–1.0)
Monocytes Relative: 11 %
Neutro Abs: 2.8 10*3/uL (ref 1.7–7.7)
Neutrophils Relative %: 52 %
Platelets: 213 10*3/uL (ref 150–400)
RBC: 2.87 MIL/uL — ABNORMAL LOW (ref 4.22–5.81)
RDW: 13.3 % (ref 11.5–15.5)
WBC: 5.2 10*3/uL (ref 4.0–10.5)
nRBC: 0 % (ref 0.0–0.2)

## 2021-01-20 LAB — BASIC METABOLIC PANEL
Anion gap: 8 (ref 5–15)
BUN: 24 mg/dL — ABNORMAL HIGH (ref 6–20)
CO2: 27 mmol/L (ref 22–32)
Calcium: 9.3 mg/dL (ref 8.9–10.3)
Chloride: 105 mmol/L (ref 98–111)
Creatinine, Ser: 1.58 mg/dL — ABNORMAL HIGH (ref 0.61–1.24)
GFR, Estimated: 51 mL/min — ABNORMAL LOW (ref >60)
Glucose, Bld: 88 mg/dL (ref 70–99)
Potassium: 4.3 mmol/L (ref 3.5–5.1)
Sodium: 140 mmol/L (ref 135–145)

## 2021-01-20 MED ORDER — LEVETIRACETAM 250 MG PO TABS
250.0000 mg | ORAL_TABLET | Freq: Every day | ORAL | Status: DC
Start: 2021-01-21 — End: 2021-01-21
  Administered 2021-01-21: 250 mg via ORAL
  Filled 2021-01-20: qty 1

## 2021-01-20 NOTE — Progress Notes (Signed)
Physical Therapy Discharge Summary  Patient Details  Name: Jackson Figueroa MRN: [id number] Date of Birth: 02-May-1961  Today's Date: 01/23/2021 PT Individual Time:     Patient has met 4 of 11 long term goals due to improved activity tolerance, improved balance, improved postural control, increased strength and ability to compensate for deficits.  Patient to discharge at a wheelchair level [place].   Patient's care partner is independent to provide the necessary physical assistance at discharge. Pt's girlfriend has completed hands-on family education in regards to assisting pt with transfers w/c to/from bed. Following patient's fall last week he has experienced and overall decline in function due to increase in L knee pain and edema from a meniscal tear. The patient initially was able to perform sit to stand with min A with RW and ambulation up to 150 ft with RW and min A. Pt now requires max A for sit to stand at times and can only perform short distance gait (up to 15 ft) before LE buckle on him. Pt was also able to perform stairs at a min A level, however with his recent change in function he is not safe to navigate stairs. Therapy has recommended that patient and his family have a ramp installed for safe entry into the home, pt and his family adamant that they do not want a ramp. Provided handout for how to bump a w/c up/down stairs safely with two able-bodied people.  Reasons goals not met: fall w/knee injury Pt has not met all rehab goals due to a decrease in overall functional level following a controlled fall on the stairs during a therapy session. Pt initially was min A for sit to stand to RW, gait up to 150 ft with RW, and min A for stair navigation but is now max A for sit to stand, can only ambulate short distances, and is unsafe to attempt stairs.  Recommendation:  Patient will benefit from ongoing skilled PT services in home health setting to continue to advance safe functional mobility,  address ongoing impairments in endurance, strength, balance, safety, independence with functional mobility, and minimize fall risk. Pt will require ongoing skilled therapy services in order to continue to progress to a greater level of independence and decrease family/caregiver burden.  Equipment: 20x20 w/c with back cushion, RW  Reasons for discharge: discharge from hospital  Patient/family agrees with progress made and goals achieved: Yes  PT Discharge Precautions/Restrictions Precautions Precautions: Fall Restrictions Weight Bearing Restrictions: No Vital Signs   Pain L knee, 4/10, treatment to tolerance   Vision/Perception  Perception Perception: Within Functional Limits Praxis Praxis: Intact  Cognition Overall Cognitive Status: Within Functional Limits for tasks assessed Orientation Level: Oriented X4 Memory: Appears intact Awareness: Appears intact Sensation Sensation Light Touch: Impaired Detail Peripheral sensation comments: reports N/T in R ulnar distribution Proprioception: Appears Intact Coordination Gross Motor Movements are Fluid and Coordinated: Yes Fine Motor Movements are Fluid and Coordinated: No Coordination and Movement Description: mildly limited d/t R ulnar nerve atrophy/pain Motor  Motor Motor - Skilled Clinical Observations: feneral deconditioning, edema L knee  Mobility Bed Mobility Bed Mobility: Rolling Right;Rolling Left;Supine to Sit;Sit to Supine Rolling Right: Independent Rolling Left: Independent Supine to Sit: Independent Sit to Supine: Independent Transfers Transfers: Sit to Stand;Stand to Sit;Stand Pivot Transfers Sit to Stand: Minimal Assistance - Patient > 75% Stand to Sit: Minimal Assistance - Patient > 75% Stand Pivot Transfers: Minimal Assistance - Patient > 75% Transfer (Assistive device): Rolling walker Locomotion  Gait Ambulation: Yes  Gait Distance (Feet): 15 Feet Assistive device: Rolling walker Gait Gait: Yes Gait  Pattern: Impaired Gait velocity: decreased Wheelchair Mobility Wheelchair Mobility: Yes Wheelchair Assistance: Independent with assistive device Wheelchair Propulsion: Both upper extremities Wheelchair Parts Management: Independent Distance: 150  Trunk/Postural Assessment  Cervical Assessment Cervical Assessment: Within Functional Limits Lumbar Assessment Lumbar Assessment: Within Functional Limits  Balance Balance Balance Assessed: Yes Berg Balance Test Sit to Stand: Needs moderate or maximal assist to stand Static Sitting Balance Static Sitting - Balance Support: Feet supported Static Sitting - Level of Assistance: 7: Independent Dynamic Sitting Balance Dynamic Sitting - Level of Assistance: 5: Stand by assistance Static Standing Balance Static Standing - Level of Assistance: Other (comment) (only w/RW due to meniscal injury) Extremity Assessment  RUE Assessment RUE Assessment: Exceptions to [facility] Passive Range of Motion (PROM) Comments: WFL Active Range of Motion (AROM) Comments: WFL General Strength Comments: strength functional when MMT but limited d/t ulnar nerve impingement/muscle atrophy LUE Assessment LUE Assessment: Within Functional Limits Active Range of Motion (AROM) Comments: WFL General Strength Comments: shoulders 4-5, elbow 4-4+/5, wrist 4/5 RLE Assessment Passive Range of Motion (PROM) Comments: wnl General Strength Comments: 4/5 quads and hams, DF 3+, hip flex 2+, abd/add 3+ RLE Strength Right Hip Flexion: 2/5 Right Hip ABduction: 3-/5 Right Hip ADduction: 3-/5 LLE Assessment General Strength Comments: 4/5 quads and hams, DF 3+, hip flex 2+, abd/add 3+   Callie Fielding, PT   Excell Seltzer, PT, DPT 01/27/2021, 4:15 PM

## 2021-01-20 NOTE — Progress Notes (Signed)
Patient ID: Jackson Figueroa, male   DOB: 1961/02/13, 60 y.o.   MRN: [id number]  SW received updates from [facility] reporting unable to accept referral.   SW waiting on follow-up from [facility] about referral.  *branch declined.   SW sent referral to [facility] and waiting on follow-up.   SW met with pt in room on challenges with obtaining HH, and informed waiting on one more agency to follow-up. SW informed if no HHA, will continue to make efforts to find an agency after d/c. Pt mentioned staying 2-3 additional days per therapy. SW informed waiting on follow-up from therapy to confirm.   *[facility] does not service [place] area. Therapy confirms extension for 2-3 days. Pt amenable to Thursday. Will confirm d/c date in team conference tomorrow.  HHA provider list provided by CM, some agencies were listed in [place] and do not travel into [place].,  HHA Denials [facility]- out of network benefits [facility]- no contract with [facility]- out of network [facility] [place] [facility] [place], [place]W, [place] Office: [phone] Cell: [phone] Fax: [phone]

## 2021-01-20 NOTE — Progress Notes (Signed)
Physical Therapy Session Note  Patient Details  Name: Jackson Figueroa MRN: [id number] Date of Birth: 15-Dec-1960  Today's Date: 01/20/2021 PT Individual Time:  -      Short Term Goals: Week 1:  PT Short Term Goal 1 (Week 1): Patient will be able to transition sit <> supine with CGA PT Short Term Goal 1 - Progress (Week 1): Progressing toward goal PT Short Term Goal 2 (Week 1): Patient will be able to transfer with LRAD and MaxA PT Short Term Goal 2 - Progress (Week 1): Met PT Short Term Goal 3 (Week 1): Patient will tolerate standing with LRAD for >1 min PT Short Term Goal 3 - Progress (Week 1): Partly met Week 2:  PT Short Term Goal 1 (Week 2): Pt will tolerate standing x 5 min with LRAD PT Short Term Goal 1 - Progress (Week 2): Partly met PT Short Term Goal 2 (Week 2): Pt will complete least restrictive transfer with mod A consistently PT Short Term Goal 2 - Progress (Week 2): Met PT Short Term Goal 3 (Week 2): Pt will initiate gait training as safe and able PT Short Term Goal 3 - Progress (Week 2): Partly met Week 3:  PT Short Term Goal 1 (Week 3): Pt will be able to perform sit <> stands with max assist with LRAD PT Short Term Goal 1 - Progress (Week 3): Met PT Short Term Goal 2 (Week 3): Pt will be able to gait with lift equipment x 20' PT Short Term Goal 2 - Progress (Week 3): Met PT Short Term Goal 3 (Week 3): Pt will be able to perform bed <> w/c transfers without use of lift equipment with min/mod assist PT Short Term Goal 3 - Progress (Week 3): Met  Skilled Therapeutic Interventions/Progress Updates:    PAIN R knee pain > L, provided w/icepacks at end of session, instructed to rotate x 20 min  Pt initially oob in wc and agreeable to treatment session w/focus on problem solving safe method of stair negotiation.   Dons wc gloves independently. Pt propelled wc > 157f. Mod I. Therapist demonstrated method of bumping up/down stairs on bottom using additional 6in step at landing to  decrease STF height and decrease power up demand.    Pt attempted Sit to stand from wc, mult efforts, unable to achieve upright stance.  Discussed need for ramp, pt declines this due to length needed to meet code.  Will not consider this option.  Discussed need for family ed for training w/wc bump on stairs due to no safe method at this point for entering home.  States girlfriend will be here this pm, no other family available.    Options for entering home limited by pt willingness to consider ramp and LE strength/stability as demonstrated by recent fall. Pt propels wc back to room  Mod I. Pt left oob in wc w/alarm belt set and needs in reach   Therapy Documentation Precautions:  Precautions Precautions: Fall Precaution Comments: PEG, sacral and toe wounds Restrictions Weight Bearing Restrictions: No    Therapy/Group: Individual Therapy  BCallie Fielding PWhat Cheer2/28/2022, 7:42 AM

## 2021-01-20 NOTE — Progress Notes (Signed)
Inpatient Rehabilitation Care Coordinator Discharge Note  The overall goal for the admission was met for:   Discharge location: Yes. D/c to home with 24/7 care from s/o who can provide supervision.   Length of Stay: Yes. 36 days.   Discharge activity level: Yes.   Home/community participation: Yes. Limited  Services provided included: MD, RD, PT, OT, SLP, RN, CM, TR, Pharmacy, Neuropsych and SW  Financial Services: Private Insurance: BCBS  Choices offered to/list presented to:Yes  Follow-up services arranged: Other: N/A  Comments (or additional information): Leg rest to be delivered to patient home  Patient/Family verbalized understanding of follow-up arrangements: Yes  Individual responsible for coordination of the follow-up plan: contact pt #[phone]  Confirmed correct DME delivered: Auria A Chamberlain 01/20/2021    Auria A Chamberlain 

## 2021-01-20 NOTE — Progress Notes (Signed)
Occupational Therapy Session Note  Patient Details  Name: Alberto Pina MRN: [id number] Date of Birth: 09/28/1961  Today's Date: 01/20/2021 OT Individual Time: 8341-9622 OT Individual Time Calculation (min): 59 min   Skilled Therapeutic Interventions/Progress Updates:    Pt greeted in his w/c with SO [name] present. Pt reported standing a lot during previous PT session, requesting for seated tx to rest his knees today. Pt self propelled the w/c halfway towards the central elevators and was escorted remainder of the way to the community setting of the hospital. Worked on w/c navigation and proficiency while using his ordered w/c for home. Pt self propelled around the gift shop and food court areas. Pressure relief exercises via 10 w/c push ups 2 sets. Pt able to partially unweight buttocks but unable to clear from cushion. OT then guided him through UB exercises to minimize risk of UB strain/injury. Pt c/o that his leg rests increased knee pain. OT attempted to remove leg rests to apply new extending leg rests to chair but unable to. Advised him to try ELRs using his old w/c to see if this is something they can use to modify his w/c before he d/c home. Pt was returned to the room via w/c and remained sitting up, in care of RN, ice provided for his ice packs to manage knee pain.    Therapy Documentation Precautions:  Precautions Precautions: Fall Precaution Comments: PEG, sacral and toe wounds Restrictions Weight Bearing Restrictions: No Vital Signs: Therapy Vitals Temp: 97.8 F (36.6 C) Pulse Rate: 81 Resp: 18 BP: (!) 131/95 Patient Position (if appropriate): Sitting Oxygen Therapy SpO2: 100 % O2 Device: Room Air Pain: Pain Assessment Pain Scale: 0-10 Pain Score: 2  Pain Type: Acute pain Pain Location: Knee Pain Orientation: Left Pain Descriptors / Indicators: Stabbing Pain Frequency: Intermittent Pain Onset: On-going Patients Stated Pain Goal: 2 ADL: ADL Eating: Set up Grooming:  Setup Where Assessed-Grooming: Edge of bed Upper Body Bathing: Setup Where Assessed-Upper Body Bathing: Edge of bed Lower Body Bathing: Maximal assistance Where Assessed-Lower Body Bathing: Bed level Upper Body Dressing: Minimal assistance Where Assessed-Upper Body Dressing: Edge of bed Lower Body Dressing: Maximal assistance Where Assessed-Lower Body Dressing: Bed level     Therapy/Group: Individual Therapy  Michaela A Hoffman 01/20/2021, 4:01 PM

## 2021-01-20 NOTE — Progress Notes (Signed)
Physical Therapy Session Note  Patient Details  Name: Jackson Figueroa MRN: [id number] Date of Birth: 09/18/1961  Today's Date: 01/20/2021 PT Individual Time: 0800-0900; [id number] PT Individual Time Calculation (min): 60 min and 45 min  Short Term Goals: Week 4:  PT Short Term Goal 1 (Week 4): =LTG due to ELOS  Skilled Therapeutic Interventions/Progress Updates:    Session 1: Pt received sidelying in bed, agreeable to PT session. Bed mobility Supervision. Squat pivot transfer to w/c with CGA. Dependent transport via w/c to therapy gym for time conservation. Squat pivot transfers at [facility] level throughout session. Sit to stand with up to mod A from elevated mat table to RW, cues and increased time needed for full knee extension. Standing alt L/R marches 3 x 5 reps with RW and mod A for balance. Stand pivot transfer mat table to w/c with RW and mod A for balance. Sit to stand from lower height seat of w/c with assist x 2. Ambulation x 10 ft, x 15 ft with RW and mod A for balance, close w/c follow for safety. Pt continues to exhibit decreased control of BLE and decreased tolerance for standing activity this week since experiencing controlled fall on stairs with knees buckling last week. Pt open to idea of extending his LOS this week due to recent functional set-backs. Pt left seated EOB in room with needs in reach at end of session.  Session 2: Pt received seated EOB, agreeable to PT session. No complaints of pain at rest, reports of knee pain with mobility. Pt utilized ice packs prior to session for pain management. Provided handout for how to have family assist pt with bumping a w/c up stairs as pt will likely not be safe to navigate stairs upon d/c home. Recommending pt have two able-bodied individuals assist him with stairs via w/c. Pt's w/c he will d/c home with arrived in room, assisted pt with setting up w/c and demonstrating placement of seat cushion, back cushion, and how to fold chair to fit into car.  Squat pivot transfer bed to w/c with CGA. Pt is independent for w/c mobility up to 150 ft, requires cues for management of w/c parts. Sit to stand x 5 reps from elevated mat table to RW with min A x 2. Pt requires increased time and manual cues for B knee extension in standing. While in standing pt able to perform alt L/R marches with RW and min A for balance, 3 x 8-10 reps to fatigue. Pt left seated in w/c in room with needs in reach at end of session.  Therapy Documentation Precautions:  Precautions Precautions: Fall Precaution Comments: PEG, sacral and toe wounds Restrictions Weight Bearing Restrictions: No   Therapy/Group: Individual Therapy   Excell Seltzer, PT, DPT  01/20/2021, 12:16 PM

## 2021-01-20 NOTE — Progress Notes (Signed)
[place] PHYSICAL MEDICINE & REHABILITATION PROGRESS NOTE   Subjective/Complaints: Continues to have ulnar neuropathy in right hand. Notes worse after therapy. Discussed minimizing elbow resting on armrest. He does not feel Cymbalta is helping much.     ROS:   Pt denies SOB, abd pain, CP, N/V/C/D, and vision changes   Objective:   No results found. Recent Labs    01/20/21 0501  WBC 5.2  HGB 9.2*  HCT 28.4*  PLT 213   Recent Labs    01/18/21 0505 01/20/21 0501  NA 139 140  K 4.1 4.3  CL 104 105  CO2 27 27  GLUCOSE 85 88  BUN 22* 24*  CREATININE 1.44* 1.58*  CALCIUM 9.3 9.3    Intake/Output Summary (Last 24 hours) at 01/20/2021 1945 Last data filed at 01/20/2021 1841 Gross per 24 hour  Intake 1110 ml  Output 2700 ml  Net -1590 ml        Physical Exam: Vital Signs Blood pressure (!) 131/95, pulse 81, temperature 97.8 F (36.6 C), resp. rate 18, height 6' (1.829 m), weight 110 kg, SpO2 100 %. Gen: no distress, normal appearing HEENT: oral mucosa pink and moist, NCAT Cardio: Reg rate Chest: normal effort, normal rate of breathing Abd: soft, non-distended Ext: no clubbing, cyanosis, Top of R foot trace/slightly puffy- TTP- less TTP Psych: appropriate- no change Skin: Warm and dry.  Scab over PEG site. Sacrum- pictured below--stable- resolved Neuro: Ox3 Right ulnar neuropathy- numbness in lateral 4th/5th digit (more in 5th digit numbness) on R with intrinsic atrophy- exam is consistent- no change Motor: Right upper extremity: Shoulder abduction 4 --4/5, distally 3/5 Left upper extremity: 4/5 proximal distal Bilateral lower extremities: Hip flexion, knee extension 3 -/5, ankle dorsiflexion 4/5    Assessment/Plan: 1. Functional deficits which require 3+ hours per day of interdisciplinary therapy in a comprehensive inpatient rehab setting.  Physiatrist is providing close team supervision and 24 hour management of active medical problems listed  below.  Physiatrist and rehab team continue to assess barriers to discharge/monitor patient progress toward functional and medical goals  Care Tool:  Bathing    Body parts bathed by patient: Right arm,Left arm,Chest,Abdomen,Front perineal area,Right upper leg,Left upper leg,Right lower leg,Left lower leg,Face,Buttocks   Body parts bathed by helper: Buttocks     Bathing assist Assist Level: Minimal Assistance - Patient > 75%     Upper Body Dressing/Undressing Upper body dressing   What is the patient wearing?: Pull over shirt    Upper body assist Assist Level: Independent    Lower Body Dressing/Undressing Lower body dressing      What is the patient wearing?: Pants     Lower body assist Assist for lower body dressing: Minimal Assistance - Patient > 75%     Toileting Toileting    Toileting assist Assist for toileting: Dependent - Patient 0% (using bariatric Stedy) Assistive Device Comment: Urinal   Transfers Chair/bed transfer  Transfers assist  Chair/bed transfer activity did not occur: Safety/medical concerns (due to weakness/fatigue)  Chair/bed transfer assist level: Contact Guard/Touching assist (squat pivot)     Locomotion Ambulation   Ambulation assist   Ambulation activity did not occur: Safety/medical concerns (due to weakness/fatigue)  Assist level: 2 helpers Assistive device: Walker-rolling Max distance: 15'   Walk 10 feet activity   Assist  Walk 10 feet activity did not occur: Safety/medical concerns (due to weakness/fatigue)  Assist level: 2 helpers Assistive device: Walker-rolling   Walk 50 feet activity   Assist  Walk 50 feet with 2 turns activity did not occur: Safety/medical concerns (due to weakness/fatigue)  Assist level: 2 helpers Assistive device: Walker-rolling    Walk 150 feet activity   Assist Walk 150 feet activity did not occur: Safety/medical concerns (due to weakness/fatigue)  Assist level: 2 helpers Assistive  device: Walker-rolling    Walk 10 feet on uneven surface  activity   Assist Walk 10 feet on uneven surfaces activity did not occur: Safety/medical concerns (due to weakness/fatigue)         Wheelchair     Assist Will patient use wheelchair at discharge?: Yes Type of Wheelchair: Manual Wheelchair activity did not occur: Safety/medical concerns (due to weakness/fatigue)  Wheelchair assist level: Supervision/Verbal cueing Max wheelchair distance: 150'    Wheelchair 50 feet with 2 turns activity    Assist    Wheelchair 50 feet with 2 turns activity did not occur: Safety/medical concerns (due to weakness/fatigue)   Assist Level: Supervision/Verbal cueing   Wheelchair 150 feet activity     Assist  Wheelchair 150 feet activity did not occur: Safety/medical concerns (due to weakness/fatigue)   Assist Level: Supervision/Verbal cueing   Blood pressure (!) 131/95, pulse 81, temperature 97.8 F (36.6 C), resp. rate 18, height 6' (1.829 m), weight 110 kg, SpO2 100 %.    Medical Problem List and Plan: 1.  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 Will check.   2/24- PEG to be removed 2/25 based on 8 week requirement  2/25- trying to get IR to remove PEG today- removed at bedside           Continue CIR PT, OT 2.  Antithrombotics: -DVT/anticoagulation:  Pharmaceutical: Continue Lovenox             -antiplatelet therapy: N/A 3. Pain Management: tylenol prn  tramadol 50 mg q6 hours prn  Continue Lyrica  Controlled meds on 2/5  2/11- reinforced I'd really like him to take Ibuprofen 1-2x/day- no more- and prefer 1x/day due to elevated Cr- he voiced understanding  2/14- increased lyrica to 75 mg TID yesterday- but with Cr up to 1.65 cannot increase more- can try Duloxetine 30 mg nightly for nerve pain  2/15- d/w  pt- he wanted it increased, but explained can not increase Lyrica right now- explained will take a few days for Duloxetine to help  2/16- Ibuprofen changed to daily prn- will d/w pt again tomorrow most likely cause of kidney fxn getting worse- will see if can stop ibuprofen  2/18- changed to 1x/day Ibuprofen- Cr better to 1.35  2/19: c/o ulnar nerve pain: increase Cymbalta to 40mg .   2/20: pain improved: continue current regimen  2/21 - Cr up to 1.63 again- it's the Ibuprofen- will d/w pt to remove  2/22- stopped Ibuprofen- pt agrees it's likely the cause. Will increase Duloxetine to 60 mg QHS and see if that helps pain- if not, will try Keppra.  2/24- d/w pt- will look into starting Keppra over weekend/Tuesday if no improvement by weekend.   2/25- will start keppra this weekend- no change with duloxetine so far.    2/28: Keppra 250mg  daily started.  4. Mood: LCSW to follow for evaluation and support.              -antipsychotic agents: Seroquel 25 2/21- off Seroquel 5. Neuropsych: This patient is capable of making decisions  on his own behalf. 6. Skin/Wound Care:                          DTI with MASD: areas on buttocks improving.  Zinc for moisture barrier as well as pressure relief measures.    2/16- on regular bed that can stretch to his height- off specialty bed.  7. Fluids/Electrolytes/Nutrition: Monitor I/O. Encourage fluid intake. Continue to monitor lytes with serial checks.  8. Morbid obesity: Educate on importance of diet and weight loss to help promote overall health. Encourage side lying when in bed.  9. Pre-renal azotemia vs new CKD Stage IIIa: Improving off tube feeds--encourage fluid intake.   2/16- labs tomorrow- Cr issues likely due to Ibuprofen  2/17- Cr down to 1.41- and BUN 20- much improved- con't regimen for now  2/18- Cr down to 1.35- better  2/21- Cr back up to 1.63- is likely ibuprofen- will d/w pt tomorrow  2/22- d/c ibuprofen  2/24- Cr only down slightly to 1.59-  not sure why- he's drinking a lot per pt and staff- will recheck Saturday.   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              Hemoglobin 9.5 on 2/3  overall stable- con't regimen 11. Encephalopathy: Has resolved. Continue Seroquel to help with sleep hygeine.  12.  Dysphagia: Resolved  2/2- PEG was placed 12/30- so cannot be removed yet  2/24- will remove PEG 2/25-2/26 since that's 8 weeks from 12/30  2/25- PEG removed today 13. Recent trach: Closed  2/9- has some trach site drainage- appears purulent- will start Keflex 500 mg BID (per CrCl) x 3 days- and see how it goes  2/10- no drainage seen this AM, but will con't to monitor- WBC is better at 5.9k  2/13- stoma closed after silver nitrate, can use bandaid or no dressing  2/14- closed/healed  14.  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  Lyrica 50 mg TID_ explained won't help numbness/weakness, only nerve pain.  2/7- helping nerve pain a lot- con't regimen  2/16- added Duloxetine 30 mg nightly and Lyrica now 75 mg TID- not enough per pt- trying to see if Duloxetine will kick in.   2/17- add Lidocaine cream QID prn for R hand  2/18- no improvement with voltaren or lidocaine- with Cr going down, Monday can increase Duloxetine to 60 mg daily  2/24- Duloxetine at 60 mg- hasn't helped so far. 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 Muscle spasms  2/8- will try Skelaxin 800 mg TID prn for muscle spasms- since c/o severe muscle tightness  2/9- didn't complain of muscle spasms today 17. Previous HTN  2/11- refusing BP meds- will stop  2/19-20: hypotensive- continue to monitor.  18. Insomnia  2/11- stopped Seroquel per  pt request 19. Loose stools  2/15- make colace prn  2/17- doing better- more regular- not loose- con't regimen 20. Bladder accidents/urgency/incontinence  2/21- Oxybutynin with the meds he's on, will dry him out too much- will try Myrbetriq for now 25 mg daily.   2/22- no improvement so far, but just started meds.  2/25- says is somewhat better- con't regimen 21. High fall risk  2/24- had assisted fall in therapy today.     LOS: 33 days A FACE TO FACE EVALUATION WAS PERFORMED  Clide Deutscher Madeleine Fenn 01/20/2021, 7:45 PM

## 2021-01-21 ENCOUNTER — Inpatient Hospital Stay (HOSPITAL_COMMUNITY): Payer: BC Managed Care – PPO

## 2021-01-21 LAB — BASIC METABOLIC PANEL
Anion gap: 8 (ref 5–15)
BUN: 25 mg/dL — ABNORMAL HIGH (ref 6–20)
CO2: 26 mmol/L (ref 22–32)
Calcium: 9.1 mg/dL (ref 8.9–10.3)
Chloride: 104 mmol/L (ref 98–111)
Creatinine, Ser: 1.5 mg/dL — ABNORMAL HIGH (ref 0.61–1.24)
GFR, Estimated: 54 mL/min — ABNORMAL LOW (ref >60)
Glucose, Bld: 87 mg/dL (ref 70–99)
Potassium: 4.3 mmol/L (ref 3.5–5.1)
Sodium: 138 mmol/L (ref 135–145)

## 2021-01-21 MED ORDER — LEVETIRACETAM 250 MG PO TABS
250.0000 mg | ORAL_TABLET | Freq: Two times a day (BID) | ORAL | Status: DC
Start: 2021-01-21 — End: 2021-01-24
  Administered 2021-01-21 – 2021-01-24 (×6): 250 mg via ORAL
  Filled 2021-01-21 (×6): qty 1

## 2021-01-21 NOTE — Progress Notes (Signed)
Physical Therapy Session Note  Patient Details  Name: Jackson Figueroa MRN: [id number] Date of Birth: [birth year]  Today's Date: 01/21/2021 PT Individual Time: 8768-1157 PT Individual Time Calculation (min): 28 min   Short Term Goals: Week 5:  PT Short Term Goal 1 (Week 4): =LTG due to ELOS  Skilled Therapeutic Interventions/Progress Updates:    Patient up in room in w/c ready for therapy.  Reports plans for MRI of his knee so would like to work on uppers.  Patient propelled in w/c to therapy gym with S and increased time.  Seated for therex as noted below.  Patient performed w/c propulsion with LE's forward then pushing back for LE strength x about 40'.  Propelled in chair to room.  Patient performed w/c to bed transfer with CGA lateral scoot with S for w/c set up.  Left seated EOB with alarm active and needs in reach.   Seated core ex: all x 2 sets Blue weighted ball in out x 10, shoulder to hip x 10 each way, blue t-band tricep extension x 10, rows x 10, wc push ups x 10  and pulling down diagonal with blue t-band x 10 each direction.  Therapy Documentation Precautions:  Precautions Precautions: Fall Precaution Comments: PEG, sacral and toe wounds Restrictions Weight Bearing Restrictions: No Pain: Pain Assessment Pain Scale: 0-10 Pain Score: 0-No pain Pain Type: Chronic pain Pain Location: Hand Pain Orientation: Left Pain Descriptors / Indicators: Spasm Pain Frequency: Intermittent Patients Stated Pain Goal: 1 Pain Intervention(s): Medication (See eMAR)   Therapy/Group: Individual Therapy  Reginia Naas  Magda Kiel, PT 01/21/2021, 9:24 AM

## 2021-01-21 NOTE — Progress Notes (Signed)
Patient ID: Jackson Figueroa, male   DOB: Jun 21, 1961, 60 y.o.   MRN: [id number]  SW met with pt in room to confirm d/c date changed to 3/4. Pt stated his sister is arriving mid afternoon on Friday. Pt expressed concerns about w/c in which the w/c cushion strap broke, and the leg rests are not too long and hard to get off. SW informed will share with vendor.  SW updated [employer] rep on concerns, and she stated will have a tech come by and look at w/c.  Loralee Pacas, MSW, [place] Office: [phone] Cell: [phone] Fax: [phone]

## 2021-01-21 NOTE — Progress Notes (Signed)
Occupational Therapy Session Note  Patient Details  Name: Jackson Figueroa MRN: [id number] Date of Birth: Jul 31, 1961  Today's Date: 01/21/2021 OT Individual Time: 1500-1530 OT Individual Time Calculation (min): 30 min    Short Term Goals: Week 3:  OT Short Term Goal 1 (Week 3): Pt will be able to tolerate standing in [place] stedy for at least 3 minutes to demonstrate improved endurance. OT Short Term Goal 1 - Progress (Week 3): Progressing toward goal OT Short Term Goal 2 (Week 3): Pt will doff/don pants utilizing lateral leans while seated EOB or on BSC with min A OT Short Term Goal 2 - Progress (Week 3): Met OT Short Term Goal 3 (Week 3): Pt will perform DABSC transfer with CGA OT Short Term Goal 3 - Progress (Week 3): Met OT Short Term Goal 4 (Week 3): Pt will perform toilet hygiene with min A OT Short Term Goal 4 - Progress (Week 3): Progressing toward goal OT Short Term Goal 5 (Week 3): Pt will perform TTB transfer with min A  Skilled Therapeutic Interventions/Progress Updates:    Pt unavailable at beginning of session due to out for MRI.  Pt returned 10 minutes after start of session via w/c.  Agreeable to OT session, requesting to get back in bed at end.  Pt completed lateral scoot w/c to EOB with supervision.  Pt instructed through BUE therex using medium resistance therapy band to complete shoulder abduction, scapular retractions, and lat pull downs; 3 x 15 reps each; required min intermittent VCs to improve body mechanics.  Pt doffed bilateral shoes and completed sit to supine with distant supervision. Call bell in reach, bed alarm on.  Therapy Documentation Precautions:  Precautions Precautions: Fall Precaution Comments: PEG, sacral and toe wounds Restrictions Weight Bearing Restrictions: No   Therapy/Group: Individual Therapy  Ezekiel Slocumb 01/21/2021, 4:10 PM

## 2021-01-21 NOTE — Patient Care Conference (Signed)
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 Courtney Heys Care Coodinator Present: Loralee Pacas, LCSWA;Ramona Slinger Creig Hines, RN, BSN, [place] Nurse Present: Other (comment) Jarvis Morgan, RN) PT Present: Excell Seltzer, PT OT Present: Lillia Corporal, OT PPS Coordinator present : Gunnar Fusi, SLP     Current Status/Progress Goal Weekly Team Focus  Bowel/Bladder   Pt continent of B/B. Last BM-2/27  remain continent.  Assess q shift and PRN   Swallow/Nutrition/ Hydration             ADL's   bathing at shower level CGA/CS, UB dress set up, LB dress Min, toilet Min/CGA, squat pivots CGA  CGA overall  overall endurance and strength, core strength, standing balance and tolerance, BADL training, family ed   Mobility   Supervision bed mobility, S to CGA squat pivot, up to max A to stand to RW, mod A SPT with RW, gait up to 15 ft with assist x 2 and RW (w/c follow), unsafe to perform stairs following controlled fall last week  CGA overall, gait x 50 ft CGA with LRAD, stairs mod A  LE NMR, family edu, transfers   Communication             Safety/Cognition/ Behavioral Observations            Pain    Pt compalin sof pain to R hand and L knee 5 of 10. Tramadol in progress  Pain <3  assess pain q shift and PRN   Skin   eccymosis to abdomen ( due to lovenox)  No new breakdown  assess q shift and PRN     Discharge Planning:      Team Discussion: Pain to left knee from assisted fall on 01/16/21. Cr elevated. PEG removed. Foam placed over peg site. Complains of muscle spasms, given appropriate medication. Will stay until Friday, sister can come in on Friday for family education, will not have home health. Patient on target to meet rehab goals: HEP will not really work, needs to get up and move. Patient is not safe on the stairs, knees will and do buckle. Will need a ramp in place or family will have to "bump" him up the stairs. Contact guard to close supervision for ADL's. Last try to stand in gym was max assist.  *See Care Plan and progress notes for long and short-term goals.   Revisions to Treatment Plan:  Started Keppra for nerve pain.  Teaching Needs: Family education, medication management, skin/wound care, safety awareness, weight bearing precautions, transfer training, gait training, bowel and bladder management.  Current Barriers to Discharge: Inaccessible home environment, Decreased caregiver support, Home enviroment access/layout, Incontinence, Wound care, Lack  of/limited family support, Weight, Weight bearing restrictions, Medication compliance and Behavior  Possible Resolutions to Barriers: Continue current medications, provide emotional support.     Medical Summary Current Status: PEG out now; foam dressing; c/o muscle spasms; trach site healed; Continued L knee swelling/effusion/pain; nerve pain for R ulnar neuropathy worse/not better  Barriers to Discharge: Decreased family/caregiver support;Home enviroment access/layout;Incontinence;Medical stability;Weight;Wound care  Barriers to Discharge Comments: no H/H when goes home; needs outpt therapy; setback since fall last week;  not safe with stairs anymore since fall; Possible Resolutions to [employer] Focus: muscle relaxer helping; L knee MRI today due to continued knee pain/swelling; will need outpt PT if possible; started keppra for nerve pain D/C Friday 3/4   Continued Need for Acute Rehabilitation Level of Care: The patient requires daily medical management by a physician with specialized training in physical medicine and rehabilitation for the following reasons: Direction of a multidisciplinary physical rehabilitation program to maximize functional independence : Yes Medical management of patient stability for increased activity during participation in an intensive rehabilitation regime.: Yes Analysis of laboratory values and/or radiology reports with any subsequent need for medication adjustment and/or medical intervention. : Yes   I attest that I was present, lead the team conference, and concur with the assessment and plan of the team.   Cristi Loron 01/21/2021, 5:08 PM

## 2021-01-21 NOTE — Progress Notes (Signed)
** Late Entry **   01/16/21 1621  What Happened  Was fall witnessed? Yes (assisted fall during therapy at gym steps; eased down on floor)  Who witnessed fall? Almyra Free PT/ Crystal PTA  Patients activity before fall during therapy  Point of contact  (knee)  Was patient injured? No  Follow Up  MD notified Pam LOve  Time MD notified 1633  Family notified  (barb decker/ significant other)  Time family notified 1621  Additional tests No  Simple treatment Other (comment) (cleansed /OTA)  Progress note created (see row info) Yes  Adult Fall Risk Assessment  Risk Factor Category (scoring not indicated) Fall has occurred during this admission (document High fall risk)  Age 60  Fall History: Fall within 6 months prior to admission 0  Elimination; Bowel and/or Urine Incontinence 0  Elimination; Bowel and/or Urine Urgency/Frequency 2  Medications: includes PCA/Opiates, Anti-convulsants, Anti-hypertensives, Diuretics, Hypnotics, Laxatives, Sedatives, and Psychotropics 5  Patient Care Equipment 1  Mobility-Assistance 2  Mobility-Gait 2  Mobility-Sensory Deficit 2  Altered awareness of immediate physical environment 1  Impulsiveness 2  Lack of understanding of one's physical/cognitive limitations 0  Total Score 17  Patient Fall Risk Level High fall risk  Adult Fall Risk Interventions  Required Bundle Interventions *See Row Information* High fall risk - low, moderate, and high requirements implemented  Additional Interventions Lap belt while in chair/wheelchair (Rehab only)  Screening for Fall Injury Risk (To be completed on HIGH fall risk patients) - Assessing Need for Floor Mats  Risk For Fall Injury- Criteria for Floor Mats None identified - No additional interventions needed  Vitals  Temp 98.7 F (37.1 C) (taken when patient was in room/ therapist informed RN of the assisted fall)  BP 116/76  MAP (mmHg) 89  BP Location Left Arm  BP Method Automatic  Patient Position (if appropriate)  Lying  Pulse Rate 78  Pulse Rate Source Monitor  Resp 16  Oxygen Therapy  SpO2 98 %  O2 Device Room Air  Pain Assessment  Pain Score 0  Neurological  Neuro (WDL) WDL  Level of Consciousness Alert  Orientation Level Oriented X4  Cognition Appropriate at baseline  Speech Clear  Pupil Assessment  No  R Hand Grip Moderate  L Hand Grip Moderate   R Foot Dorsiflexion Present;Weak  L Foot Dorsiflexion Present;Weak  R Foot Plantar Flexion Moderate  L Foot Plantar Flexion Moderate  RUE Motor Response Purposeful movement  RUE Sensation Full sensation  RUE Motor Strength 4  LUE Motor Response Purposeful movement  LUE Sensation Full sensation  LUE Motor Strength 4  RLE Motor Response Purposeful movement  RLE Sensation Full sensation  RLE Motor Strength 4  LLE Motor Response Purposeful movement  LLE Sensation Full sensation  LLE Motor Strength 4  Neuro Symptoms None  Neuro symptoms relieved by Rest  Musculoskeletal  Musculoskeletal (WDL) X  Assistive Device Wheelchair;Front wheel walker  Generalized Weakness Yes  Weight Bearing Restrictions No  Musculoskeletal Details  RUE Limited movement  RLE Limited movement  Integumentary  Integumentary (WDL) X  Skin Color Appropriate for ethnicity  Skin Condition Dry  Skin Integrity Catheter entry/exit site  Abrasion Location Leg  Abrasion Location Orientation Lower  Abrasion Intervention Cleansed;Other (Comment)  Catheter Entry/Exit Location Abdomen  Catheter Entry/Exit Location Orientation Mid  Catheter Entry/Exit Intervention Gauze  Ecchymosis Location Abdomen  Ecchymosis Location Orientation Bilateral;Lower  Ecchymosis Intervention Other (Comment)  Excoriated Location Buttocks  Excoriated Location Orientation Bilateral  Excoriated Intervention Foam  Skin  Turgor Non-tenting

## 2021-01-21 NOTE — Progress Notes (Signed)
Physical Therapy Session Note  Patient Details  Name: Jackson Figueroa MRN: [id number] Date of Birth: October 15, 1961  Today's Date: 01/21/2021 PT Individual Time: 0800-0900; 1130-1200 PT Individual Time Calculation (min): 60 min and 30 min  Short Term Goals: Week 4:  PT Short Term Goal 1 (Week 4): =LTG due to ELOS  Skilled Therapeutic Interventions/Progress Updates:    Session 1: Pt received seated EOB, agreeable to PT session. Pt reports ongoing pain in R wrist and B knees, premedicated prior to start of therapy session. Squat pivot transfer bed to w/c with Supervision. Manual w/c propulsion 2 x 150 ft with use of BUE at Supervision level. Squat pivot transfer w/c to/from Nustep with CGA. Nustep level 3 x 5 min with use of B UE/LE for knee ROM to patient tolerance. Pt has increase in L knee pain with use of Nustep. Squat pivot transfer to mat table with Supervision. Sit to stand with max A from elevated mat table to RW this date. Pt initially unable to fully extend knees even with manual cueing and assist. With increase in sit to stand repetitions pt exhibits improved ability to extend knees. Standing alt L/R marches 3 x 10 reps to fatigue. Stand pivot transfer mat table to w/c with RW and min A. Pt left seated in w/c in room with needs in reach at end of session.  Session 2: Pt received seated in w/c in room, agreeable to PT session. Pt reports ongoing pain in R wrist and B knees, declines use of ice packs at end of session. Squat pivot transfer to bed with Supervision. Sit to stand with max A to RW, pt unable to fully extend knees in standing due to pain. Remainder of session focused on seated BLE strengthening therex: LAQ, marches, heel/toe raises, hip add squeeze, hip abd with OTB, R HS curls with OTB, L knee flexion AROM x 10-20 reps each. Pt left seated EOB with needs in reach at end of session.  Therapy Documentation Precautions:  Precautions Precautions: Fall Precaution Comments: PEG, sacral and  toe wounds Restrictions Weight Bearing Restrictions: No   Therapy/Group: Individual Therapy   Excell Seltzer, PT, DPT  01/21/2021, 1:47 PM

## 2021-01-21 NOTE — Progress Notes (Signed)
Jackson Figueroa PHYSICAL MEDICINE & REHABILITATION PROGRESS NOTE   Subjective/Complaints:  Started Keppra this AM- no side effects, but took 30 minutes ago.  Stil hurting with swelling in L knee since assisted fall last week- pt and PT concerned about it.  Doesn't feel unstable, but painful.     ROS:   Pt denies SOB, abd pain, CP, N/V/C/D, and vision changes   Objective:   No results found. Recent Labs    01/20/21 0501  WBC 5.2  HGB 9.2*  HCT 28.4*  PLT 213   Recent Labs    01/20/21 0501 01/21/21 0540  NA 140 138  K 4.3 4.3  CL 105 104  CO2 27 26  GLUCOSE 88 87  BUN 24* 25*  CREATININE 1.58* 1.50*  CALCIUM 9.3 9.1    Intake/Output Summary (Last 24 hours) at 01/21/2021 1035 Last data filed at 01/21/2021 0745 Gross per 24 hour  Intake 1350 ml  Output 2725 ml  Net -1375 ml        Physical Exam: Vital Signs Blood pressure 105/72, pulse 78, temperature 98 F (36.7 C), resp. rate 18, height 6' (1.829 m), weight 110 kg, SpO2 94 %.     General: awake, alert, appropriate, NAD- standing with PT- transferring with PT HENT: conjugate gaze; oropharynx moist CV: regular rate; no JVD Pulmonary: CTA B/L; no W/R/R- good air movement GI: soft, NT, ND, (+)BS Psychiatric: appropriate Neurological: Ox3 Ext: no clubbing, cyanosis, L knee swollen with effusion- not unstable no breakaway weakness Skin: Warm and dry.  Scab over PEG site. Sacrum- healed Neuro: Ox3 Right ulnar neuropathy- numbness in lateral 4th/5th digit (more in 5th digit numbness) on R with intrinsic atrophy- exam is consistent- no change Motor: Right upper extremity: Shoulder abduction 4 --4/5, distally 3/5 Left upper extremity: 4/5 proximal distal Bilateral lower extremities: Hip flexion, knee extension 3 -/5, ankle dorsiflexion 4/5    Assessment/Plan: 1. Functional deficits which require 3+ hours per day of interdisciplinary therapy in a comprehensive inpatient rehab setting.  Physiatrist is  providing close team supervision and 24 hour management of active medical problems listed below.  Physiatrist and rehab team continue to assess barriers to discharge/monitor patient progress toward functional and medical goals  Care Tool:  Bathing    Body parts bathed by patient: Right arm,Left arm,Chest,Abdomen,Front perineal area,Right upper leg,Left upper leg,Right lower leg,Left lower leg,Face,Buttocks   Body parts bathed by helper: Buttocks     Bathing assist Assist Level: Minimal Assistance - Patient > 75%     Upper Body Dressing/Undressing Upper body dressing   What is the patient wearing?: Pull over shirt    Upper body assist Assist Level: Independent    Lower Body Dressing/Undressing Lower body dressing      What is the patient wearing?: Pants     Lower body assist Assist for lower body dressing: Minimal Assistance - Patient > 75%     Toileting Toileting    Toileting assist Assist for toileting: Dependent - Patient 0% (using bariatric Stedy) Assistive Device Comment: Urinal   Transfers Chair/bed transfer  Transfers assist  Chair/bed transfer activity did not occur: Safety/medical concerns (due to weakness/fatigue)  Chair/bed transfer assist level: Contact Guard/Touching assist (squat pivot)     Locomotion Ambulation   Ambulation assist   Ambulation activity did not occur: Safety/medical concerns (due to weakness/fatigue)  Assist level: 2 helpers Assistive device: Walker-rolling Max distance: 15'   Walk 10 feet activity   Assist  Walk 10 feet activity did not  occur: Safety/medical concerns (due to weakness/fatigue)  Assist level: 2 helpers Assistive device: Walker-rolling   Walk 50 feet activity   Assist Walk 50 feet with 2 turns activity did not occur: Safety/medical concerns (due to weakness/fatigue)  Assist level: 2 helpers Assistive device: Walker-rolling    Walk 150 feet activity   Assist Walk 150 feet activity did not  occur: Safety/medical concerns (due to weakness/fatigue)  Assist level: 2 helpers Assistive device: Walker-rolling    Walk 10 feet on uneven surface  activity   Assist Walk 10 feet on uneven surfaces activity did not occur: Safety/medical concerns (due to weakness/fatigue)         Wheelchair     Assist Will patient use wheelchair at discharge?: Yes Type of Wheelchair: Manual Wheelchair activity did not occur: Safety/medical concerns (due to weakness/fatigue)  Wheelchair assist level: Supervision/Verbal cueing Max wheelchair distance: 150'    Wheelchair 50 feet with 2 turns activity    Assist    Wheelchair 50 feet with 2 turns activity did not occur: Safety/medical concerns (due to weakness/fatigue)   Assist Level: Supervision/Verbal cueing   Wheelchair 150 feet activity     Assist  Wheelchair 150 feet activity did not occur: Safety/medical concerns (due to weakness/fatigue)   Assist Level: Supervision/Verbal cueing   Blood pressure 105/72, pulse 78, temperature 98 F (36.7 C), resp. rate 18, height 6' (1.829 m), weight 110 kg, SpO2 94 %.    Medical Problem List and Plan: 1.  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 Will check.   2/24- PEG to be removed 2/25 based on 8 week requirement  2/25- trying to get IR to remove PEG today- removed at bedside           Continue CIR PT, OT 2.  Antithrombotics: -DVT/anticoagulation:  Pharmaceutical: Continue Lovenox             -antiplatelet therapy: N/A 3. Pain Management: tylenol prn  tramadol 50 mg q6 hours prn  Continue Lyrica  Controlled meds on 2/5  2/11- reinforced I'd really like him to take Ibuprofen 1-2x/day- no more- and prefer 1x/day due to elevated Cr- he voiced understanding  2/14- increased lyrica to 75 mg TID yesterday- but with Cr up  to 1.65 cannot increase more- can try Duloxetine 30 mg nightly for nerve pain  2/15- d/w pt- he wanted it increased, but explained can not increase Lyrica right now- explained will take a few days for Duloxetine to help  2/16- Ibuprofen changed to daily prn- will d/w pt again tomorrow most likely cause of kidney fxn getting worse- will see if can stop ibuprofen  2/18- changed to 1x/day Ibuprofen- Cr better to 1.35  2/19: c/o ulnar nerve pain: increase Cymbalta to 40mg .   2/20: pain improved: continue current regimen  2/21 - Cr up to 1.63 again- it's the Ibuprofen- will d/w pt to remove  2/22- stopped Ibuprofen- pt agrees it's likely the cause. Will increase Duloxetine to 60 mg QHS and see if that helps pain- if not, will try Keppra.  2/24- d/w pt- will look into starting Keppra over weekend/Tuesday if no improvement by weekend.   2/25- will start keppra this weekend- no change with duloxetine so far.    2/28: Keppra 250mg  daily started.  3/1- will change to BID since that's the normal starting dose.   4. Mood: LCSW  to follow for evaluation and support.              -antipsychotic agents: Seroquel 25 2/21- off Seroquel 5. Neuropsych: This patient is capable of making decisions on his own behalf. 6. Skin/Wound Care:                          DTI with MASD: areas on buttocks improving.  Zinc for moisture barrier as well as pressure relief measures.    2/16- on regular bed that can stretch to his height- off specialty bed.  7. Fluids/Electrolytes/Nutrition: Monitor I/O. Encourage fluid intake. Continue to monitor lytes with serial checks.  8. Morbid obesity: Educate on importance of diet and weight loss to help promote overall health. Encourage side lying when in bed.  9. Pre-renal azotemia vs new CKD Stage IIIa: Improving off tube feeds--encourage fluid intake.   2/16- labs tomorrow- Cr issues likely due to Ibuprofen  2/17- Cr down to 1.41- and BUN 20- much improved- con't regimen for now  2/18-  Cr down to 1.35- better  2/21- Cr back up to 1.63- is likely ibuprofen- will d/w pt tomorrow  2/22- d/c ibuprofen  2/24- Cr only down slightly to 1.59- not sure why- he's drinking a lot per pt and staff- will recheck Saturday.   2/25- labs tomorrow  3/1- Cr still 1.50- is drinking- don't have anything can decrease to help kidney fxn.  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              Hemoglobin 9.5 on 2/3  overall stable- con't regimen 11. Encephalopathy: Has resolved. Continue Seroquel to help with sleep hygeine.  12.  Dysphagia: Resolved  2/2- PEG was placed 12/30- so cannot be removed yet  2/24- will remove PEG 2/25-2/26 since that's 8 weeks from 12/30  2/25- PEG removed today 13. Recent trach: Closed  2/9- has some trach site drainage- appears purulent- will start Keflex 500 mg BID (per CrCl) x 3 days- and see how it goes  2/10- no drainage seen this AM, but will con't to monitor- WBC is better at 5.9k  2/13- stoma closed after silver nitrate, can use bandaid or no dressing  2/14- closed/healed  14.  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  Lyrica 50 mg TID_ explained won't help numbness/weakness, only nerve pain.  2/7- helping nerve pain a lot- con't regimen  2/16- added Duloxetine 30 mg nightly and Lyrica now 75 mg TID- not enough per pt- trying to see if Duloxetine will kick in.   2/17- add Lidocaine cream QID prn for R hand  2/18- no improvement with voltaren or lidocaine- with Cr going down, Monday can increase Duloxetine to 60 mg daily  2/24- Duloxetine at 60 mg- hasn't helped so far. 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 Muscle  spasms  2/8- will try Skelaxin 800 mg TID prn for muscle spasms- since c/o severe muscle tightness  2/9- didn't complain of muscle spasms today 17. Previous HTN  2/11- refusing BP meds- will stop  2/19-20: hypotensive- continue to monitor.  18. Insomnia  2/11- stopped  Seroquel per pt request 19. Loose stools  2/15- make colace prn  2/17- doing better- more regular- not loose- con't regimen 20. Bladder accidents/urgency/incontinence  2/21- Oxybutynin with the meds he's on, will dry him out too much- will try Myrbetriq for now 25 mg daily.   2/22- no improvement so far, but just started meds.   2/25- says is somewhat better- con't regimen 21. High fall risk  2/24- had assisted fall in therapy today.   3/1- will get MRI of L knee since swollen and painful- have ordered. Think could have meniscal tear    LOS: 34 days A FACE TO FACE EVALUATION WAS PERFORMED  Jackson Figueroa 01/21/2021, 10:35 AM

## 2021-01-21 NOTE — Progress Notes (Signed)
Occupational Therapy Session Note  Patient Details  Name: Jackson Figueroa MRN: [id number] Date of Birth: [birth year]  Today's Date: 01/21/2021 OT Individual Time: 1030-1100 OT Individual Time Calculation (min): 30 min    Short Term Goals: Week 4:  OT Short Term Goal 1 (Week 4): Pt will be able to tolerate standing in [place] stedy for at least 3 minutes to demonstrate improved endurance. OT Short Term Goal 1 - Progress (Week 4): Met OT Short Term Goal 2 (Week 4): Pt will perform toilet hygiene with min A OT Short Term Goal 2 - Progress (Week 4): Met  Skilled Therapeutic Interventions/Progress Updates:    Pt received sitting up in bed, agreeable to therapy. Supine > sitting EOB with distant S + use of bed features, req to use bathroom. Scoot pivot to w/c from bed with close S, able to adjust w/c breaks and arm rest as needed. Transport to bathroom, Stand - pivot to toilet with min A for balance and to block L knee, req multiple STS attempts with RW. Total A for LB clothing management. Cont void of bladder with use of urinal. N void of bowel. Seated peri care distant S, attempting to raise pants via lateral leans, req mod A to pull over B hips when pushing up from arm rests, SB transfer to w/c with set-up A then close S. Self-propelled w/c out of bathroom and to sink to wash hands distant S. STS at sink with min A to power up + facilitate upright posture. C/o of L knee pain so returned to w/c after ~30 secs. Req to remain in w/c to work on laptop until next session. Pt left in w/c, call bell in reach, and all immediate needs met.    Therapy Documentation Precautions:  Precautions Precautions: Fall Precaution Comments: PEG, sacral and toe wounds Restrictions Weight Bearing Restrictions: No   Pain: Pain Assessment Pain Scale: 0-10 Pain Score: 0-No pain Pain Type: Chronic pain Pain Location: Hand Pain Orientation: Left Pain Descriptors / Indicators: Spasm Pain Frequency: Intermittent Patients  Stated Pain Goal: 1 Pain Intervention(s): Medication (See eMAR) ADL: See Care Tool for more details.  Therapy/Group: Individual Therapy  Volanda Napoleon MS, OTR/L  01/21/2021, 10:47 AM

## 2021-01-22 ENCOUNTER — Inpatient Hospital Stay (HOSPITAL_COMMUNITY): Payer: BC Managed Care – PPO

## 2021-01-22 DIAGNOSIS — N189 Chronic kidney disease, unspecified: Secondary | ICD-10-CM

## 2021-01-22 MED ORDER — DICLOFENAC SODIUM 1 % EX GEL
2.0000 g | Freq: Four times a day (QID) | CUTANEOUS | Status: DC
  Administered 2021-01-22 – 2021-01-24 (×3): 2 g via TOPICAL
  Filled 2021-01-22: qty 100

## 2021-01-22 MED ORDER — ACETAMINOPHEN 325 MG PO TABS: 325.0000 mg | ORAL_TABLET | ORAL | Status: AC | PRN

## 2021-01-22 NOTE — Progress Notes (Signed)
Occupational Therapy Session Note  Patient Details  Name: Jackson Figueroa MRN: [id number] Date of Birth: 1961-06-24  Today's Date: 01/22/2021 OT Individual Time: 1478-2956 OT Individual Time Calculation (min): 60 min    Short Term Goals: Week 4:  OT Short Term Goal 1 (Week 4): Pt will be able to tolerate standing in [place] stedy for at least 3 minutes to demonstrate improved endurance. OT Short Term Goal 1 - Progress (Week 4): Met OT Short Term Goal 2 (Week 4): Pt will perform toilet hygiene with min A OT Short Term Goal 2 - Progress (Week 4): Met Week 5:  OT Short Term Goal 1 (Week 5): STG=LTG 2/2 ELOS  Skilled Therapeutic Interventions/Progress Updates:    Pt received sitting EOB requesting a shower.  He completed a squat pivot with supervision to Wc and then onto tub bench with S. He requested therapist to pull down his pants as he stood but encouraged him to use a lateral lean on bench to doff pants and he was able to do that well.  Used figure 4 to doff socks.  Bathed with set up using long sponge to reach feet.  Dried off and then transferred back to w/c with S. Sat at sink to complete oral care and UB dressing independently. Donned pants over feet and then used a push up on wc so therapist could pull pants over hips for pt.  He did not feel ready to stand due to knee pain. Pt completed socks and shoes independently.   Pt in wc with all needs met.   Therapy Documentation Precautions:  Precautions Precautions: Fall Precaution Comments: PEG, sacral and toe wounds Restrictions Weight Bearing Restrictions: No   Pain:  C/o L knee pain - RN aware  ADL: ADL Eating: Independent Grooming: Independent Where Assessed-Grooming: Sitting at sink Upper Body Bathing: Setup Where Assessed-Upper Body Bathing: Shower Lower Body Bathing: Setup Where Assessed-Lower Body Bathing: Shower Upper Body Dressing: Independent Where Assessed-Upper Body Dressing: Wheelchair Lower Body Dressing: Minimal  assistance Where Assessed-Lower Body Dressing: Teaching laboratory technician: Close supervision Social research officer, government Method: Education officer, environmental: Transfer tub bench,Grab bars   Therapy/Group: Individual Therapy  Gonzales 01/22/2021, 10:48 AM

## 2021-01-22 NOTE — Progress Notes (Signed)
Jackson Figueroa PHYSICAL MEDICINE & REHABILITATION PROGRESS NOTE   Subjective/Complaints:   Jackson Figueroa thinks Keppra makes him pretty sleepy at least yesterday- so far, not too sleepy this AM, but trying to weigh Sx's vs benefits?  About to get meds right now.   Also went over L knee MRI with pt- hx of scope on knee before, but also now has L ACL sprain, mod-large effusion, severe meniscal tears on medial and lateral aspect- pt wants to have fluid drawn off knee and to d/w Ortho what they think the plan would be. Also, interested in knee injections/steroid.    ROS:   Pt denies SOB, abd pain, CP, N/V/C/D, and vision changes   Objective:   MR KNEE LEFT WO CONTRAST  Result Date: 01/21/2021 CLINICAL DATA:  Pain EXAM: MRI OF THE LEFT KNEE WITHOUT CONTRAST TECHNIQUE: Multiplanar, multisequence MR imaging of the knee was performed. No intravenous contrast was administered. COMPARISON:  None. FINDINGS: MENISCI Medial: There is a complex tear of the posterior horn of the medial meniscus which extends to the mid body. A of the midbody is displaced into the medial gutter. The tear also extends through the anterior horn of the medial meniscus. Lateral: Intact. LIGAMENTS Cruciates: Increased intrasubstance signal seen within the ACL, however it is intact. The PCL is intact. Collaterals: Increased signal seen within the MCL with probable tiny focal perforation the meniscofemoral deep ligament. The lateral collateral ligamentous complex is intact. CARTILAGE Patellofemoral: There is chondral fissuring seen within the central patellar apex and the lateral patellar facet. Medial compartment: Diffuse chondral thinning with near complete cartilage loss seen weight-bearing surface of the medial femoral condyle medial tibial plateau with marginal osteophyte formation. Lateral compartment: Mild chondral fissuring seen the weight-bearing surface of the lateral femoral condyle. BONES: No fracture. No avascular necrosis. No  pathologic marrow infiltration. JOINT: A large knee joint effusion is present with scattered debris. Edema seen within Hoffa's fat pad. No plical thickening. EXTENSOR MECHANISM: The patellar and quadriceps tendon are intact. The retinaculum is unremarkable. POPLITEAL FOSSA: There is a large 4 cm loculated popliteal cyst with evidence of recent partial rupture. OTHER: There is mild edema seen within the popliteus muscle belly. Increased signal seen at the insertion site of the medial head of the gastrocnemius. There is edema seen at the posterior popliteal fossa of the knee. There is also mildly increased signal seen vastus medialis. Increased signal with mild fatty atrophy seen within muscles of the lower extremity below the knee. IMPRESSION: 1. Complex tear of the medial meniscus with a small extruded portion displaced within the gutter. 2. Intrasubstance sprain of the ACL, however it is intact 3. Grade 1-2 medial collateral ligamentous injury with surrounding edema 4. 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 Moderate to large knee joint effusion with synovitis 6. Large loculated popliteal cyst with evidence of recent partial rupture 7. Insertional medial head gastrocnemius tendinosis Electronically Signed   By: Prudencio Pair M.D.   On: 01/21/2021 17:10   Recent Labs    01/20/21 0501  WBC 5.2  HGB 9.2*  HCT 28.4*  PLT 213   Recent Labs    01/20/21 0501 01/21/21 0540  NA 140 138  K 4.3 4.3  CL 105 104  CO2 27 26  GLUCOSE 88 87  BUN 24* 25*  CREATININE 1.58* 1.50*  CALCIUM 9.3 9.1    Intake/Output Summary (Last 24 hours) at 01/22/2021 1056 Last data filed at 01/22/2021 0727 Gross per 24 hour  Intake  896 ml  Output 1600 ml  Net -704 ml        Physical Exam: Vital Signs Blood pressure (!) 131/57, pulse 70, temperature 97.6 F (36.4 C), resp. rate 18, height 6' (1.829 m), weight 110 kg, SpO2 97 %.      General: awake, alert, appropriate,  NAD- sitting EOB, with RN in room HENT: conjugate gaze; oropharynx moist CV: regular rate; no JVD Pulmonary: CTA B/L; no W/R/R- good air movement GI: soft, NT, ND, (+)BS Psychiatric: appropriate- asking questions about knee and MRI results Neurological: Ox3 Ext: no clubbing, cyanosis, L knee moderate to severe swelling AND effusion- due to TTP over medial/lateral joint line, asked me not to test for meniscal tears, knowing he has them Skin: Warm and dry.  Scab over PEG site. Sacrum- healed Right ulnar neuropathy- numbness in lateral 4th/5th digit (more in 5th digit numbness) on R with intrinsic atrophy- exam is consistent- no change Motor: Right upper extremity: Shoulder abduction 4 --4/5, distally 3/5 Left upper extremity: 4/5 proximal distal Bilateral lower extremities: Hip flexion 4/5 B/L, knee extension 3 -/5 still in L knee 4/5 on R side, due to L knee pain, ankle dorsiflexion 4/5    Assessment/Plan: 1. Functional deficits which require 3+ hours per day of interdisciplinary therapy in a comprehensive inpatient rehab setting.  Physiatrist is providing close team supervision and 24 hour management of active medical problems listed below.  Physiatrist and rehab team continue to assess barriers to discharge/monitor patient progress toward functional and medical goals  Care Tool:  Bathing    Body parts bathed by patient: Right arm,Left arm,Chest,Abdomen,Front perineal area,Right upper leg,Left upper leg,Right lower leg,Left lower leg,Face,Buttocks   Body parts bathed by helper: Buttocks     Bathing assist Assist Level: Set up assist     Upper Body Dressing/Undressing Upper body dressing   What is the patient wearing?: Pull over shirt    Upper body assist Assist Level: Independent    Lower Body Dressing/Undressing Lower body dressing      What is the patient wearing?: Pants     Lower body assist Assist for lower body dressing: Minimal Assistance - Patient > 75%      Toileting Toileting    Toileting assist Assist for toileting: Minimal Assistance - Patient > 75% Assistive Device Comment: Urinal   Transfers Chair/bed transfer  Transfers assist  Chair/bed transfer activity did not occur: Safety/medical concerns (due to weakness/fatigue)  Chair/bed transfer assist level: Supervision/Verbal cueing     Locomotion Ambulation   Ambulation assist   Ambulation activity did not occur: Safety/medical concerns (due to weakness/fatigue)  Assist level: 2 helpers Assistive device: Walker-rolling Max distance: 15'   Walk 10 feet activity   Assist  Walk 10 feet activity did not occur: Safety/medical concerns (due to weakness/fatigue)  Assist level: 2 helpers Assistive device: Walker-rolling   Walk 50 feet activity   Assist Walk 50 feet with 2 turns activity did not occur: Safety/medical concerns (due to weakness/fatigue)  Assist level: 2 helpers Assistive device: Walker-rolling    Walk 150 feet activity   Assist Walk 150 feet activity did not occur: Safety/medical concerns (due to weakness/fatigue)  Assist level: 2 helpers Assistive device: Walker-rolling    Walk 10 feet on uneven surface  activity   Assist Walk 10 feet on uneven surfaces activity did not occur: Safety/medical concerns (due to weakness/fatigue)         Wheelchair     Assist Will patient use wheelchair at discharge?: Yes  Type of Wheelchair: Manual Wheelchair activity did not occur: Safety/medical concerns (due to weakness/fatigue)  Wheelchair assist level: Supervision/Verbal cueing Max wheelchair distance: 150'    Wheelchair 50 feet with 2 turns activity    Assist    Wheelchair 50 feet with 2 turns activity did not occur: Safety/medical concerns (due to weakness/fatigue)   Assist Level: Supervision/Verbal cueing   Wheelchair 150 feet activity     Assist  Wheelchair 150 feet activity did not occur: Safety/medical concerns (due to  weakness/fatigue)   Assist Level: Supervision/Verbal cueing   Blood pressure (!) 131/57, pulse 70, temperature 97.6 F (36.4 C), resp. rate 18, height 6' (1.829 m), weight 110 kg, SpO2 97 %.    Medical Problem List and Plan: 1.  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 Will check.   3/2- PEG removed last week- con't foam dressing; said can't do much therapy due to L knee pain/swelling           Continue CIR PT, OT 2.  Antithrombotics: -DVT/anticoagulation:  Pharmaceutical: Continue Lovenox             -antiplatelet therapy: N/A 3. Pain Management: tylenol prn  tramadol 50 mg q6 hours prn  Continue Lyrica  Controlled meds on 2/5  2/11- reinforced I'd really like him to take Ibuprofen 1-2x/day- no more- and prefer 1x/day due to elevated Cr- he voiced understanding  2/14- increased lyrica to 75 mg TID yesterday- but with Cr up to 1.65 cannot increase more- can try Duloxetine 30 mg nightly for nerve pain  2/15- d/w pt- he wanted it increased, but explained can not increase Lyrica right now- explained will take a few days for Duloxetine to help  2/19: c/o ulnar nerve pain: increase Cymbalta to 40mg .   2/20: pain improved: continue current regimen  2/21 - Cr up to 1.63 again- it's the Ibuprofen- will d/w pt to remove    2/28: Keppra 250mg  daily started.  3/1- will change to BID since that's the normal starting dose.    3/2- was pretty sleepy yesterday- will con't for now, but monitor sedation 4. Mood: LCSW to follow for evaluation and support.              -antipsychotic agents: Seroquel 25 2/21- off Seroquel 5. Neuropsych: This patient is capable of making decisions on his own behalf. 6. Skin/Wound Care:                          DTI with MASD: areas on buttocks improving.  Zinc for moisture barrier as well as pressure relief  measures.    2/16- on regular bed that can stretch to his height- off specialty bed.  7. Fluids/Electrolytes/Nutrition: Monitor I/O. Encourage fluid intake. Continue to monitor lytes with serial checks.  8. Morbid obesity: Educate on importance of diet and weight loss to help promote overall health. Encourage side lying when in bed.  9. Pre-renal azotemia vs new CKD Stage IIIa: Improving off tube feeds--encourage fluid intake.   2/16- labs tomorrow- Cr issues likely due to Ibuprofen  2/17- Cr down to 1.41- and BUN 20- much improved- con't regimen for now  2/18- Cr down to 1.35- better  2/21- Cr back up to 1.63- is likely ibuprofen- will d/w pt tomorrow  2/22- d/c ibuprofen  2/24- Cr only down slightly to  1.59- not sure why- he's drinking a lot per pt and staff- will recheck Saturday.   2/25- labs tomorrow  3/1- Cr still 1.50- is drinking- don't have anything can decrease to help kidney fxn.  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              Hemoglobin 9.5 on 2/3  overall stable- con't regimen 11. Encephalopathy: Has resolved. Continue Seroquel to help with sleep hygeine.  12.  Dysphagia: Resolved  2/2- PEG was placed 12/30- so cannot be removed yet  2/24- will remove PEG 2/25-2/26 since that's 8 weeks from 12/30  2/25- PEG removed today 13. Recent trach: Closed  2/9- has some trach site drainage- appears purulent- will start Keflex 500 mg BID (per CrCl) x 3 days- and see how it goes  2/10- no drainage seen this AM, but will con't to monitor- WBC is better at 5.9k  2/13- stoma closed after silver nitrate, can use bandaid or no dressing  2/14- closed/healed  14.  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  Lyrica 50 mg TID_ explained won't help numbness/weakness, only nerve pain.  2/7- helping nerve pain a lot- con't regimen  2/16- added Duloxetine 30 mg nightly and Lyrica now 75 mg TID- not enough per pt- trying to  see if Duloxetine will kick in.   2/17- add Lidocaine cream QID prn for R hand  2/18- no improvement with voltaren or lidocaine- with Cr going down, Monday can increase Duloxetine to 60 mg daily  2/24- Duloxetine at 60 mg- hasn't helped so far. 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 Muscle spasms  2/8- will try Skelaxin 800 mg TID prn for muscle spasms- since c/o severe muscle tightness  2/9- didn't complain of muscle spasms today  3/2- likes Skelaxin for muscle spasms- con't regimen 17. Previous HTN  2/11- refusing BP meds- will stop  2/19-20: hypotensive- continue to monitor.  18. Insomnia  2/11- stopped Seroquel per pt request 19. Loose stools  2/15- make colace prn  2/17- doing better- more regular- not loose- con't regimen 20. Bladder accidents/urgency/incontinence  2/21- Oxybutynin with the meds he's on, will dry him out too much- will try Myrbetriq for now 25 mg daily.   2/22- no improvement so far, but just started meds.   2/25- says is somewhat better- con't regimen 21. High fall risk with L knee medial and lateral meniscus tear  2/24- had assisted fall in therapy today.   3/1- will get MRI of L knee since swollen and painful- have ordered. Think could have meniscal tear  3/2- medial and lateral meniscal tears and ACL sprain and large joint effusion/swelling- will ask Ortho to come see and discuss surgery as well as knee injection and take fluid off knee.       LOS: 35 days A FACE TO FACE EVALUATION WAS PERFORMED  Wilhelmine Krogstad 01/22/2021, 10:56 AM

## 2021-01-22 NOTE — Progress Notes (Signed)
Patient ID: Jackson Figueroa, male   DOB: August 06, 1961, 60 y.o.   MRN: [id number]  [employer] came to see pt to look at leg rests and swap out seat cushion. Pt requested leg extensions. SW ordered leg extensions with [employer] via parachute.   Loralee Pacas, MSW, [place] Office: [phone] Cell: [phone] Fax: [phone]

## 2021-01-22 NOTE — Consult Note (Signed)
Reason for Consult:Knee pain Referring Physician: Lucio Edward Time called: [id number] Time at bedside: [name] is an 60 y.o. male.  HPI: Jackson Figueroa fell on Thursday onto both of his knees. He's been having pain in them since, left more than right. He underwent MRI yesterday and orthopedic surgery was consulted. He denies significant before he got sick though I see it's in his past medical history. He admits to previous arthroscopy in that knee.  Past Medical History:  Diagnosis Date  . Allergic   . Knee pain, bilateral    due to old injuires  . Numbness and tingling in right hand    and elbow due to sports injury  . Obesity   . Staph infection    of appy wound--treated with antibiotics X 4 weeks.   . Tendinitis of elbow    right    Past Surgical History:  Procedure Laterality Date  . APPENDECTOMY  1982  . INGUINAL HERNIA REPAIR Left 2000  . IR GASTROSTOMY TUBE MOD SED  11/21/2020  . IR GASTROSTOMY TUBE REMOVAL  01/17/2021  . KNEE ARTHROSCOPY     bilateral knees  . TONSILLECTOMY AND ADENOIDECTOMY    . TRACHEOSTOMY TUBE PLACEMENT N/A 11/14/2020   Procedure: TRACHEOSTOMY;  Surgeon: Melida Quitter, MD;  Location: [facility];  Service: ENT;  Laterality: N/A;    Family History  Problem Relation Age of Onset  . Cerebral aneurysm Mother 56  . Heart disease Father   . Migraines Sister     Social History:  reports that he quit smoking about 8 years ago. His smoking use included cigarettes. He smoked 2.00 packs per day. He has never used smokeless tobacco. He reports current alcohol use of about 2.0 standard drinks of alcohol per week. He reports that he does not use drugs.  Allergies: No Known Allergies  Medications: I have reviewed the patient's current medications.  Results for orders placed or performed during the hospital encounter of 12/18/20 (from the past 48 hour(s))  Basic metabolic panel     Status: Abnormal   Collection Time: 01/21/21  5:40 AM  Result Value Ref Range    Sodium 138 135 - 145 mmol/L   Potassium 4.3 3.5 - 5.1 mmol/L   Chloride 104 98 - 111 mmol/L   CO2 26 22 - 32 mmol/L   Glucose, Bld 87 70 - 99 mg/dL    Comment: Glucose reference range applies only to samples taken after fasting for at least 8 hours.   BUN 25 (H) 6 - 20 mg/dL   Creatinine, Ser 1.50 (H) 0.61 - 1.24 mg/dL   Calcium 9.1 8.9 - 10.3 mg/dL   GFR, Estimated 54 (L) >60 mL/min    Comment: (NOTE) Calculated using the CKD-EPI Creatinine Equation (2021)    Anion gap 8 5 - 15    Comment: Performed at [facility] [address], [place], [place] [address]    MR KNEE LEFT WO CONTRAST  Result Date: 01/21/2021 CLINICAL DATA:  Pain EXAM: MRI OF THE LEFT KNEE WITHOUT CONTRAST TECHNIQUE: Multiplanar, multisequence MR imaging of the knee was performed. No intravenous contrast was administered. COMPARISON:  None. FINDINGS: MENISCI Medial: There is a complex tear of the posterior horn of the medial meniscus which extends to the mid body. A of the midbody is displaced into the medial gutter. The tear also extends through the anterior horn of the medial meniscus. Lateral: Intact. LIGAMENTS Cruciates: Increased intrasubstance signal seen within the ACL, however it  is intact. The PCL is intact. Collaterals: Increased signal seen within the MCL with probable tiny focal perforation the meniscofemoral deep ligament. The lateral collateral ligamentous complex is intact. CARTILAGE Patellofemoral: There is chondral fissuring seen within the central patellar apex and the lateral patellar facet. Medial compartment: Diffuse chondral thinning with near complete cartilage loss seen weight-bearing surface of the medial femoral condyle medial tibial plateau with marginal osteophyte formation. Lateral compartment: Mild chondral fissuring seen the weight-bearing surface of the lateral femoral condyle. BONES: No fracture. No avascular necrosis. No pathologic marrow infiltration. JOINT: A large knee joint effusion is  present with scattered debris. Edema seen within Hoffa's fat pad. No plical thickening. EXTENSOR MECHANISM: The patellar and quadriceps tendon are intact. The retinaculum is unremarkable. POPLITEAL FOSSA: There is a large 4 cm loculated popliteal cyst with evidence of recent partial rupture. OTHER: There is mild edema seen within the popliteus muscle belly. Increased signal seen at the insertion site of the medial head of the gastrocnemius. There is edema seen at the posterior popliteal fossa of the knee. There is also mildly increased signal seen vastus medialis. Increased signal with mild fatty atrophy seen within muscles of the lower extremity below the knee. IMPRESSION: 1. Complex tear of the medial meniscus with a small extruded portion displaced within the gutter. 2. Intrasubstance sprain of the ACL, however it is intact 3. Grade 1-2 medial collateral ligamentous injury with surrounding edema 4. 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 Moderate to large knee joint effusion with synovitis 6. Large loculated popliteal cyst with evidence of recent partial rupture 7. Insertional medial head gastrocnemius tendinosis Electronically Signed   By: Prudencio Pair M.D.   On: 01/21/2021 17:10   DG Knee Complete 4 Views Left  Result Date: 01/22/2021 CLINICAL DATA:  Effusion.  Recent fall. EXAM: LEFT KNEE - COMPLETE 4+ VIEW COMPARISON:  MRI 01/21/2021. FINDINGS: Tricompartment degenerative change. Degenerative changes most prominent about the medial compartment. No evidence of fracture or dislocation. Prominent knee joint effusion. Small calcific densities noted over popliteal space. These could represent loose bodies and/or a process such as synovial osteochondromas within previously identified large popliteal cyst. IMPRESSION: 1. Tricompartment degenerative change. Degenerative changes most prominent about the medial compartment. No acute bony abnormality identified. 2.  Prominent knee joint effusion. 3. Small calcific densities noted over the popliteal space. These could represent loose bodies and or process such as synovial osteochondromas within the previously identified large popliteal cyst. Electronically Signed   By: Trainer   On: 01/22/2021 12:58    Review of Systems  HENT: Negative for ear discharge, ear pain, hearing loss and tinnitus.   Eyes: Negative for photophobia and pain.  Respiratory: Negative for cough and shortness of breath.   Cardiovascular: Negative for chest pain.  Gastrointestinal: Negative for abdominal pain, nausea and vomiting.  Genitourinary: Negative for dysuria, flank pain, frequency and urgency.  Musculoskeletal: Positive for arthralgias (Bilateral knees, L>R). Negative for back pain, myalgias and neck pain.  Neurological: Negative for dizziness and headaches.  Hematological: Does not bruise/bleed easily.  Psychiatric/Behavioral: The patient is not nervous/anxious.    Blood pressure 118/90, pulse 96, temperature 98.7 F (37.1 C), resp. rate 18, height 6' (1.829 m), weight 110 kg, SpO2 95 %. Physical Exam Constitutional:      General: He is not in acute distress.    Appearance: He is well-developed and well-nourished. He is not diaphoretic.  HENT:     Head: Normocephalic and atraumatic.  Eyes:  General: No scleral icterus.       Right eye: No discharge.        Left eye: No discharge.     Conjunctiva/sclera: Conjunctivae normal.  Cardiovascular:     Rate and Rhythm: Normal rate and regular rhythm.  Pulmonary:     Effort: Pulmonary effort is normal. No respiratory distress.  Musculoskeletal:     Cervical back: Normal range of motion.     Comments: LLE No traumatic wounds, ecchymosis, or rash  Knee wrapped and iced  No ankle effusion  Sens DPN, SPN, TN intact  Motor EHL, ext, flex, evers 5/5  DP 1+, PT 1+, No significant edema  Skin:    General: Skin is warm and dry.  Neurological:     Mental Status: He  is alert.  Psychiatric:        Mood and Affect: Mood and affect normal.        Behavior: Behavior normal.     Assessment/Plan: Left knee pain -- Advised continued ACE wraps and ice. Would expect the acute injuries to get substantially better in 3 weeks or so. If not he should f/u with orthopedic surgery as an outpatient. He'd like to stay local so I referred him to [facility] He may WBAT.    Lisette Abu, PA-C Orthopedic Surgery [phone] 01/22/2021, 3:56 PM

## 2021-01-22 NOTE — Progress Notes (Signed)
Physical Therapy Session Note  Patient Details  Name: Jackson Figueroa MRN: [id number] Date of Birth: 1961/01/02  Today's Date: 01/22/2021 PT Individual Time: 1115-1200; 1400-1510 PT Individual Time Calculation (min): 45 min and 70 min  Short Term Goals: Week 4:  PT Short Term Goal 1 (Week 4): =LTG due to ELOS  Skilled Therapeutic Interventions/Progress Updates:    Session 1: Pt received seated EOB, agreeable to PT session. Pt reports ongoing pain in B knees, L>R. Provided ice packs to knees at end of session for pain management. ACE-wrapped L knee for compression and increased support during mobility. Session focus on creating HEP for patient to continue to strengthen BLE in tolerable range due to L meniscus tear. Squat pivot transfer throughout session with Supervision. Pt is independent for management of w/c parts and w/c mobility up to 150 ft with use of BUE. Sit to/from supine at mod I level with increased time needed for transfer. Reviewed HEP as noted below:  Access Code: [email or web address]  Exercises Supine Bridge - 1 x daily - 7 x weekly - 3 sets - 10 reps Active Straight Leg Raise with Quad Set - 1 x daily - 7 x weekly - 3 sets - 10 reps Supine Short Arc Quad - 1 x daily - 7 x weekly - 3 sets - 10 reps Supine Heel Slide - 1 x daily - 7 x weekly - 3 sets - 10 reps Seated March - 1 x daily - 7 x weekly - 3 sets - 10 reps Seated Long Arc Quad - 1 x daily - 7 x weekly - 3 sets - 10 reps Seated Hip Adduction Isometrics with Ball - 1 x daily - 7 x weekly - 3 sets - 10 reps Seated Hip Abduction with Resistance - 1 x daily - 7 x weekly - 3 sets - 10 reps   Pt returned to room at end of session, left seated EOB with ice packs in place to knees.  Session 2: Pt received sidelying in bed, agreeable to PT session. Pt reports ongoing pain in knees, provided ice packs at end of session for pain management. Bed mobility mod I with increased time needed. Squat pivot transfer bed to/from w/c at Supervision  level throughout session. Manual w/c propulsion up to 150 ft with use of BUE at mod I level. Session focus on navigation of elevators, outdoors across unlevel ground, and up/down inclines in w/c at mod I level for functional mobility in community environment. Continue to recommend to patient and family that they have able-bodied people to assist them with stair management via w/c upon return home this week. Pt and girlfriend Jackson Figueroa with no further questions or concerns regarding upcoming d/c. Pt left seated EOB in room with needs in reach, girlfriend present.  Therapy Documentation Precautions:  Precautions Precautions: Fall Precaution Comments: PEG, sacral and toe wounds Restrictions Weight Bearing Restrictions: No   Therapy/Group: Individual Therapy   Excell Seltzer, PT, DPT  01/22/2021, 5:41 PM

## 2021-01-23 ENCOUNTER — Ambulatory Visit: Payer: BC Managed Care – PPO | Admitting: Internal Medicine

## 2021-01-23 MED ORDER — DULOXETINE HCL 30 MG PO CPEP
30.0000 mg | ORAL_CAPSULE | Freq: Every day | ORAL | Status: DC
  Administered 2021-01-23: 30 mg via ORAL
  Filled 2021-01-23: qty 1

## 2021-01-23 NOTE — Progress Notes (Signed)
Occupational Therapy Discharge Summary  Patient Details  Name: Jackson Figueroa MRN: [id number] Date of Birth: 02/11/1961   Patient has met 31 of 12 long term goals due to improved activity tolerance, improved balance, postural control, ability to compensate for deficits, functional use of  RIGHT upper extremity, improved awareness and improved coordination.  Patient to discharge at overall Supervision - occasional Min A level.  Patient's care partner is independent to provide the necessary physical assistance at discharge. Patient is going home with girlfriend Jackson Figueroa who has been present for family ed and trained to provide CGA/close supervision for ADL transfers. Pt is overall CGA/close supervision for squat pivot transfers to TTB, BSC/toilet, bed, and other surfaces.    Reasons goals not met: Pt did not meet LB dressing goal as he needed Min A at last assessment.   Recommendation:  Patient will benefit from ongoing skilled OT services in home health setting to continue to advance functional skills in the area of BADL and Reduce care partner burden.  Equipment: TTB (pt purchased and has set up at home) and drop arm BSC  Reasons for discharge: treatment goals met and discharge from hospital  Patient/family agrees with progress made and goals achieved: Yes  OT Discharge Precautions/Restrictions  Precautions Precautions: Fall Restrictions Weight Bearing Restrictions: No Vital Signs Therapy Vitals Temp: 97.8 F (36.6 C) Pulse Rate: 67 Resp: 18 BP: 124/88 Patient Position (if appropriate): Lying Oxygen Therapy SpO2: 99 % O2 Device: Room Air Pain Pain Assessment Pain Scale: 0-10 Pain Score: 2  ADL ADL Eating: Independent Grooming: Independent Where Assessed-Grooming: Sitting at sink Upper Body Bathing: Setup Where Assessed-Upper Body Bathing: Shower Lower Body Bathing: Setup Where Assessed-Lower Body Bathing: Shower Upper Body Dressing: Independent Where Assessed-Upper Body  Dressing: Wheelchair Lower Body Dressing: Minimal assistance Where Assessed-Lower Body Dressing: Wheelchair Toileting: Contact guard Where Assessed-Toileting: Toilet,Bedside Commode Toilet Transfer: Therapist, music Method: Engineer, water: Drop arm bedside commode Tub/Shower Transfer: Curator: Close supervision Social research officer, government Method: Education officer, environmental: Transfer tub bench,Grab bars Vision Vision Assessment?: No apparent visual deficits Perception  Perception: Within Financial controller Praxis: Intact Cognition Overall Cognitive Status: Within Functional Limits for tasks assessed Arousal/Alertness: Awake/alert Orientation Level: Oriented X4 Awareness: Appears intact Problem Solving: Appears intact Safety/Judgment: Appears intact Sensation Sensation Light Touch: Impaired Detail Peripheral sensation comments: reports N/T in R ulnar distribution Proprioception: Appears Intact Coordination Gross Motor Movements are Fluid and Coordinated: Yes Fine Motor Movements are Fluid and Coordinated: No Coordination and Movement Description: mildly limited d/t R ulnar nerve atrophy/pain Motor  Motor Motor: Abnormal tone Motor - Skilled Clinical Observations: low tone overall from prolonged hospitalization, generalized weakness, however significantly improved function since eval Mobility  Bed Mobility Bed Mobility: Rolling Right;Rolling Left;Supine to Sit;Sit to Supine Rolling Right: Independent Rolling Left: Independent Supine to Sit: Independent Sit to Supine: Independent Transfers Sit to Stand: Minimal Assistance - Patient > 75% Stand to Sit: Minimal Assistance - Patient > 75%  Trunk/Postural Assessment  Cervical Assessment Cervical Assessment: Within Functional Limits Thoracic Assessment Thoracic Assessment: Exceptions to [facility] Lumbar Assessment Lumbar Assessment: Exceptions to  [facility] Postural Control Postural Control: Within Functional Limits  Balance Balance Balance Assessed: Yes Static Sitting Balance Static Sitting - Balance Support: Feet supported Static Sitting - Level of Assistance: 7: Independent Dynamic Sitting Balance Dynamic Sitting - Level of Assistance: 5: Stand by assistance Extremity/Trunk Assessment RUE Assessment RUE Assessment: Exceptions to [facility] Passive Range of Motion (PROM) Comments:  WFL Active Range of Motion (AROM) Comments: WFL General Strength Comments: strength functional when MMT but limited d/t ulnar nerve impingement/muscle atrophy LUE Assessment LUE Assessment: Within Functional Limits   Viona Gilmore 01/23/2021, 4:50 PM

## 2021-01-23 NOTE — Progress Notes (Incomplete)
Physical Therapy Session Note  Patient Details  Name: Jackson Figueroa MRN: [id number] Date of Birth: Jul 23, 1961  Today's Date: 01/23/2021 PT Individual Time:  -      Short Term Goals: Week 1:  PT Short Term Goal 1 (Week 1): Patient will be able to transition sit <> supine with CGA PT Short Term Goal 1 - Progress (Week 1): Progressing toward goal PT Short Term Goal 2 (Week 1): Patient will be able to transfer with LRAD and MaxA PT Short Term Goal 2 - Progress (Week 1): Met PT Short Term Goal 3 (Week 1): Patient will tolerate standing with LRAD for >1 min PT Short Term Goal 3 - Progress (Week 1): Partly met Week 2:  PT Short Term Goal 1 (Week 2): Pt will tolerate standing x 5 min with LRAD PT Short Term Goal 1 - Progress (Week 2): Partly met PT Short Term Goal 2 (Week 2): Pt will complete least restrictive transfer with mod A consistently PT Short Term Goal 2 - Progress (Week 2): Met PT Short Term Goal 3 (Week 2): Pt will initiate gait training as safe and able PT Short Term Goal 3 - Progress (Week 2): Partly met Week 3:  PT Short Term Goal 1 (Week 3): Pt will be able to perform sit <> stands with max assist with LRAD PT Short Term Goal 1 - Progress (Week 3): Met PT Short Term Goal 2 (Week 3): Pt will be able to gait with lift equipment x 20' PT Short Term Goal 2 - Progress (Week 3): Met PT Short Term Goal 3 (Week 3): Pt will be able to perform bed <> w/c transfers without use of lift equipment with min/mod assist PT Short Term Goal 3 - Progress (Week 3): Met Week 4:  PT Short Term Goal 1 (Week 4): =LTG due to ELOS  Skilled Therapeutic Interventions/Progress Updates:    Pain:  Pt reports *** pain.  Treatment to tolerance.  Rest breaks and repositioning as needed.  Pt initially *** and agreeable to treatment session with focus on preparation for DC, DC assessment.     Therapy Documentation Precautions:  Precautions Precautions: Fall Precaution Comments: PEG, sacral and toe  wounds Restrictions Weight Bearing Restrictions: No    Therapy/Group: Individual Therapy  Callie Fielding, [place] 01/23/2021, 7:56 AM

## 2021-01-23 NOTE — Progress Notes (Signed)
Physical Therapy Session Note  Patient Details  Name: Jackson Figueroa MRN: [id number] Date of Birth: Nov 11, 1961  Today's Date: 01/23/2021 PT Individual Time: 1000-1100 PT Individual Time Calculation (min): 60 min   Short Term Goals: Week 1:  PT Short Term Goal 1 (Week 1): Patient will be able to transition sit <> supine with CGA PT Short Term Goal 1 - Progress (Week 1): Progressing toward goal PT Short Term Goal 2 (Week 1): Patient will be able to transfer with LRAD and MaxA PT Short Term Goal 2 - Progress (Week 1): Met PT Short Term Goal 3 (Week 1): Patient will tolerate standing with LRAD for >1 min PT Short Term Goal 3 - Progress (Week 1): Partly met Week 2:  PT Short Term Goal 1 (Week 2): Pt will tolerate standing x 5 min with LRAD PT Short Term Goal 1 - Progress (Week 2): Partly met PT Short Term Goal 2 (Week 2): Pt will complete least restrictive transfer with mod A consistently PT Short Term Goal 2 - Progress (Week 2): Met PT Short Term Goal 3 (Week 2): Pt will initiate gait training as safe and able PT Short Term Goal 3 - Progress (Week 2): Partly met Week 3:  PT Short Term Goal 1 (Week 3): Pt will be able to perform sit <> stands with max assist with LRAD PT Short Term Goal 1 - Progress (Week 3): Met PT Short Term Goal 2 (Week 3): Pt will be able to gait with lift equipment x 20' PT Short Term Goal 2 - Progress (Week 3): Met PT Short Term Goal 3 (Week 3): Pt will be able to perform bed <> w/c transfers without use of lift equipment with min/mod assist PT Short Term Goal 3 - Progress (Week 3): Met Week 4:  PT Short Term Goal 1 (Week 4): =LTG due to ELOS  Skilled Therapeutic Interventions/Progress Updates:    Pain:  Pt reports 2/5 L knee pain.  Treatment to tolerance.  Rest breaks and repositioning as needed.  Acewraps rewrapped and pt instructed w/fig 8 compression.   Pt initially supine and agreeable to treatment session with focus on preparation for DC, DC assessment.  Pt  Independent w/all bed mobility today.   Squat pivot transfer to wc w/supervision only.   Pt propels wc >276ft to ortho gym mod I.  Car transfer: performed stand pivot transfer wc to car using RW, min assist of 1, second person guarding for safety due to high risk of falls, additional time needed to achieve upright w/sts from wc.  Cues for scooting deep into seat to allow increased clearance when lifting LLE into car.   Gait:   51ft w/RW min assist of 1 and close wc follow, cues for upright posture and to achieve max knee extension in midstance to ensure stability thru stance, cues for decreased step length again for stability, decreased clearance bilat, overall anterior trunk/increased hip flexion.  Mod I wc propulsion to room/>156ft.  Set up for transfer performed independently by pt.  Squat pivot to bed from wc w/supervision only.     Reviewed written HEP.  In addition assisted pt in navigating to [place] site for accessing video instructions. Pt left supine w/rails up x 3, bed in lowest position, and needs in reach.  Therapy Documentation Precautions:  Precautions Precautions: Fall Precaution Comments: PEG, sacral and toe wounds Restrictions Weight Bearing Restrictions: No    Therapy/Group: Individual Therapy  Callie Fielding, [place] 01/23/2021, 12:15 PM

## 2021-01-23 NOTE — Progress Notes (Signed)
Physical Therapy Session Note  Patient Details  Name: Jackson Figueroa MRN: [id number] Date of Birth: Jun 12, 1961  Today's Date: 01/23/2021 PT Individual Time: 6861-6837 PT Individual Time Calculation (min): 32 min   Short Term Goals: Week 1:  PT Short Term Goal 1 (Week 1): Patient will be able to transition sit <> supine with CGA PT Short Term Goal 1 - Progress (Week 1): Progressing toward goal PT Short Term Goal 2 (Week 1): Patient will be able to transfer with LRAD and MaxA PT Short Term Goal 2 - Progress (Week 1): Met PT Short Term Goal 3 (Week 1): Patient will tolerate standing with LRAD for >1 min PT Short Term Goal 3 - Progress (Week 1): Partly met Week 2:  PT Short Term Goal 1 (Week 2): Pt will tolerate standing x 5 min with LRAD PT Short Term Goal 1 - Progress (Week 2): Partly met PT Short Term Goal 2 (Week 2): Pt will complete least restrictive transfer with mod A consistently PT Short Term Goal 2 - Progress (Week 2): Met PT Short Term Goal 3 (Week 2): Pt will initiate gait training as safe and able PT Short Term Goal 3 - Progress (Week 2): Partly met  Skilled Therapeutic Interventions/Progress Updates:    pt received in bed and agreeable to therapy. Pt denied pain at start and end of session. Pt directed in supine>sit supervision. Pt directed in seated BUE strengthening of rows, trip extension, bicep  Curls, overhead press orange theraband 2x20. PT also showed pt video demonstration of how to bump up wheelchair to ascend stairs and descend stairs with one assist and two assist versions for safety with entering and exiting home and pt reports he did not want to get a ramp which is recommended by therapy. Pt reported he will have three additional persons to assist with this at home. Pt left in bed, alarm set, All needs in reach and in good condition. Call light in hand.    Therapy Documentation Precautions:  Precautions Precautions: Fall Precaution Comments: PEG, sacral and toe  wounds Restrictions Weight Bearing Restrictions: No General:   Vital Signs: Therapy Vitals Temp: 97.8 F (36.6 C) Pulse Rate: 67 Resp: 18 BP: 124/88 Patient Position (if appropriate): Lying Oxygen Therapy SpO2: 99 % O2 Device: Room Air Pain:   Mobility:   Locomotion :    Trunk/Postural Assessment :    Balance:   Exercises:   Other Treatments:      Therapy/Group: Individual Therapy  Junie Panning 01/23/2021, 3:53 PM

## 2021-01-23 NOTE — Progress Notes (Signed)
Patient ID: Jackson Figueroa, male   DOB: 1961-06-30, 60 y.o.   MRN: [id number]  Covering for primary SW, [place]  SW received notification for [employer] "[facility] is OOS of LR and the warehouse is OOS so once we get them in we will come to the patient's home and switch them out". SW will continue to follow up.  [place], [place]

## 2021-01-23 NOTE — Progress Notes (Signed)
Occupational Therapy Session Note  Patient Details  Name: Jackson Figueroa MRN: [id number] Date of Birth: 02-15-1961  Today's Date: 01/23/2021 OT Individual Time: 1300-1345 OT Individual Time Calculation (min): 45 min    Short Term Goals: Week 3:  OT Short Term Goal 1 (Week 3): Pt will be able to tolerate standing in [place] stedy for at least 3 minutes to demonstrate improved endurance. OT Short Term Goal 1 - Progress (Week 3): Progressing toward goal OT Short Term Goal 2 (Week 3): Pt will doff/don pants utilizing lateral leans while seated EOB or on BSC with min A OT Short Term Goal 2 - Progress (Week 3): Met OT Short Term Goal 3 (Week 3): Pt will perform DABSC transfer with CGA OT Short Term Goal 3 - Progress (Week 3): Met OT Short Term Goal 4 (Week 3): Pt will perform toilet hygiene with min A OT Short Term Goal 4 - Progress (Week 3): Progressing toward goal OT Short Term Goal 5 (Week 3): Pt will perform TTB transfer with min A Week 4:  OT Short Term Goal 1 (Week 4): Pt will be able to tolerate standing in [place] stedy for at least 3 minutes to demonstrate improved endurance. OT Short Term Goal 1 - Progress (Week 4): Met OT Short Term Goal 2 (Week 4): Pt will perform toilet hygiene with min A OT Short Term Goal 2 - Progress (Week 4): Met Week 5:  OT Short Term Goal 1 (Week 5): STG=LTG 2/2 ELOS   Skilled Therapeutic Interventions/Progress Updates:    Pt greeted at time of session semireclined in bed agreeable to OT session, mild L knee discomfort and nursing is aware, ace wraps applied for comfort. Supine > sit Mod I with bed rail, squat pivot to wheelchair set up/supervision. Discussion regarding DC planning home tomorrow with girlfriend [name], DME for [facility] and TTB (already has at home ), lack of ramp (encouraged to speak with PT), and overall plan for going home. Pt performed multiple squat pivot transfers during session including to toilet with drop arm BSC over top with CGA/close supervision, TTB  in same manner with pt showing good awareness and remembering how to use equipment. Returned to room and provided with UB strengthening HEP for both dumbbells that he has at home and therabands provided. In bed with alarm on call bell in reach.    Therapy Documentation Precautions:  Precautions Precautions: Fall Precaution Comments: PEG, sacral and toe wounds Restrictions Weight Bearing Restrictions: No     Therapy/Group: Individual Therapy  Viona Gilmore 01/23/2021, 12:56 PM

## 2021-01-23 NOTE — Progress Notes (Signed)
[facility] PHYSICAL MEDICINE & REHABILITATION PROGRESS NOTE   Subjective/Complaints:   Pt reports Ortho came and saw him- said he would need TKR in future, but not now.  Cannot inject/draw fluid off, because has blood in knee- per pt- so didn't get knee aspirated or injected.   Pt wants to f/u with Ortho after d/c - per Ortho recommended [place], [place]  ROS:   Pt denies SOB, abd pain, CP, N/V/C/D, and vision changes   Objective:   MR KNEE LEFT WO CONTRAST  Result Date: 01/21/2021 CLINICAL DATA:  Pain EXAM: MRI OF THE LEFT KNEE WITHOUT CONTRAST TECHNIQUE: Multiplanar, multisequence MR imaging of the knee was performed. No intravenous contrast was administered. COMPARISON:  None. FINDINGS: MENISCI Medial: There is a complex tear of the posterior horn of the medial meniscus which extends to the mid body. A of the midbody is displaced into the medial gutter. The tear also extends through the anterior horn of the medial meniscus. Lateral: Intact. LIGAMENTS Cruciates: Increased intrasubstance signal seen within the ACL, however it is intact. The PCL is intact. Collaterals: Increased signal seen within the MCL with probable tiny focal perforation the meniscofemoral deep ligament. The lateral collateral ligamentous complex is intact. CARTILAGE Patellofemoral: There is chondral fissuring seen within the central patellar apex and the lateral patellar facet. Medial compartment: Diffuse chondral thinning with near complete cartilage loss seen weight-bearing surface of the medial femoral condyle medial tibial plateau with marginal osteophyte formation. Lateral compartment: Mild chondral fissuring seen the weight-bearing surface of the lateral femoral condyle. BONES: No fracture. No avascular necrosis. No pathologic marrow infiltration. JOINT: A large knee joint effusion is present with scattered debris. Edema seen within Hoffa's fat pad. No plical thickening. EXTENSOR MECHANISM: The patellar and quadriceps  tendon are intact. The retinaculum is unremarkable. POPLITEAL FOSSA: There is a large 4 cm loculated popliteal cyst with evidence of recent partial rupture. OTHER: There is mild edema seen within the popliteus muscle belly. Increased signal seen at the insertion site of the medial head of the gastrocnemius. There is edema seen at the posterior popliteal fossa of the knee. There is also mildly increased signal seen vastus medialis. Increased signal with mild fatty atrophy seen within muscles of the lower extremity below the knee. IMPRESSION: 1. Complex tear of the medial meniscus with a small extruded portion displaced within the gutter. 2. Intrasubstance sprain of the ACL, however it is intact 3. Grade 1-2 medial collateral ligamentous injury with surrounding edema 4. 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 Moderate to large knee joint effusion with synovitis 6. Large loculated popliteal cyst with evidence of recent partial rupture 7. Insertional medial head gastrocnemius tendinosis Electronically Signed   By: Prudencio Pair M.D.   On: 01/21/2021 17:10   DG Knee Complete 4 Views Left  Result Date: 01/22/2021 CLINICAL DATA:  Effusion.  Recent fall. EXAM: LEFT KNEE - COMPLETE 4+ VIEW COMPARISON:  MRI 01/21/2021. FINDINGS: Tricompartment degenerative change. Degenerative changes most prominent about the medial compartment. No evidence of fracture or dislocation. Prominent knee joint effusion. Small calcific densities noted over popliteal space. These could represent loose bodies and/or a process such as synovial osteochondromas within previously identified large popliteal cyst. IMPRESSION: 1. Tricompartment degenerative change. Degenerative changes most prominent about the medial compartment. No acute bony abnormality identified. 2. Prominent knee joint effusion. 3. Small calcific densities noted over the popliteal space. These could represent loose bodies and or process  such as synovial osteochondromas within the previously  identified large popliteal cyst. Electronically Signed   By: Marcello Moores  Register   On: 01/22/2021 12:58   No results for input(s): WBC, HGB, HCT, PLT in the last 72 hours. Recent Labs    01/21/21 0540  NA 138  K 4.3  CL 104  CO2 26  GLUCOSE 87  BUN 25*  CREATININE 1.50*  CALCIUM 9.1    Intake/Output Summary (Last 24 hours) at 01/23/2021 0832 Last data filed at 01/23/2021 0452 Gross per 24 hour  Intake 472 ml  Output 1750 ml  Net -1278 ml        Physical Exam: Vital Signs Blood pressure 138/66, pulse 72, temperature 98.4 F (36.9 C), resp. rate 18, height 6' (1.829 m), weight 119.3 kg, SpO2 95 %.       General: awake, alert, appropriate, NAD- sitting EOB, with PT in room HENT: conjugate gaze; oropharynx moist CV: regular rate; no JVD Pulmonary: CTA B/L; no W/R/R- good air movement GI: soft, NT, ND, (+)BS Psychiatric: appropriate- focused on knee Neurological: Ox3 Ext: no clubbing, cyanosis, L knee moderate to severe swelling AND effusion- due to TTP over medial/lateral joint line, wrapped with ACE wraps currently Skin: Warm and dry.  Scab over PEG site. Sacrum- healed Right ulnar neuropathy- numbness in lateral 4th/5th digit (more in 5th digit numbness) on R with intrinsic atrophy- exam is consistent- no change Motor: Right upper extremity: Shoulder abduction 4 --4/5, distally 3/5 Left upper extremity: 4/5 proximal distal Bilateral lower extremities: Hip flexion 4/5 B/L, knee extension 3 -/5 still in L knee 4/5 on R side, due to L knee pain, ankle dorsiflexion 4/5    Assessment/Plan: 1. Functional deficits which require 3+ hours per day of interdisciplinary therapy in a comprehensive inpatient rehab setting.  Physiatrist is providing close team supervision and 24 hour management of active medical problems listed below.  Physiatrist and rehab team continue to assess barriers to discharge/monitor patient progress  toward functional and medical goals  Care Tool:  Bathing    Body parts bathed by patient: Right arm,Left arm,Chest,Abdomen,Front perineal area,Right upper leg,Left upper leg,Right lower leg,Left lower leg,Face,Buttocks   Body parts bathed by helper: Buttocks     Bathing assist Assist Level: Set up assist     Upper Body Dressing/Undressing Upper body dressing   What is the patient wearing?: Pull over shirt    Upper body assist Assist Level: Independent    Lower Body Dressing/Undressing Lower body dressing      What is the patient wearing?: Pants     Lower body assist Assist for lower body dressing: Minimal Assistance - Patient > 75%     Toileting Toileting    Toileting assist Assist for toileting: Minimal Assistance - Patient > 75% Assistive Device Comment: Urinal   Transfers Chair/bed transfer  Transfers assist  Chair/bed transfer activity did not occur: Safety/medical concerns (due to weakness/fatigue)  Chair/bed transfer assist level: Supervision/Verbal cueing     Locomotion Ambulation   Ambulation assist   Ambulation activity did not occur: Safety/medical concerns (due to weakness/fatigue)  Assist level: 2 helpers Assistive device: Walker-rolling Max distance: 15'   Walk 10 feet activity   Assist  Walk 10 feet activity did not occur: Safety/medical concerns (due to weakness/fatigue)  Assist level: 2 helpers Assistive device: Walker-rolling   Walk 50 feet activity   Assist Walk 50 feet with 2 turns activity did not occur: Safety/medical concerns (due to weakness/fatigue)  Assist level: 2 helpers Assistive device: Walker-rolling    Walk 150 feet  activity   Assist Walk 150 feet activity did not occur: Safety/medical concerns (due to weakness/fatigue)  Assist level: 2 helpers Assistive device: Walker-rolling    Walk 10 feet on uneven surface  activity   Assist Walk 10 feet on uneven surfaces activity did not occur: Safety/medical  concerns (due to weakness/fatigue)         Wheelchair     Assist Will patient use wheelchair at discharge?: Yes Type of Wheelchair: Manual Wheelchair activity did not occur: Safety/medical concerns (due to weakness/fatigue)  Wheelchair assist level: Independent Max wheelchair distance: 150'    Wheelchair 50 feet with 2 turns activity    Assist    Wheelchair 50 feet with 2 turns activity did not occur: Safety/medical concerns (due to weakness/fatigue)   Assist Level: Independent   Wheelchair 150 feet activity     Assist  Wheelchair 150 feet activity did not occur: Safety/medical concerns (due to weakness/fatigue)   Assist Level: Independent   Blood pressure 138/66, pulse 72, temperature 98.4 F (36.9 C), resp. rate 18, height 6' (1.829 m), weight 119.3 kg, SpO2 95 %.    Medical Problem List and Plan: 1.  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 Will check.   3/2- PEG removed last week- con't foam dressing; said can't do much therapy due to L knee pain/swelling           Continue CIR PT, OT 2.  Antithrombotics: -DVT/anticoagulation:  Pharmaceutical: Continue Lovenox             -antiplatelet therapy: N/A 3. Pain Management: tylenol prn  tramadol 50 mg q6 hours prn  Continue Lyrica  Controlled meds on 2/5  2/11- reinforced I'd really like him to take Ibuprofen 1-2x/day- no more- and prefer 1x/day due to elevated Cr- he voiced understanding  2/14- increased lyrica to 75 mg TID yesterday- but with Cr up to 1.65 cannot increase more- can try Duloxetine 30 mg nightly for nerve pain  2/19: c/o ulnar nerve pain: increase Cymbalta to 40mg .     2/28: Keppra 250mg  daily started.  3/1- will change to BID since that's the normal starting dose.    3/2- was pretty sleepy yesterday- will con't for now, but  monitor sedation  3/3- awake- no complaints about sedation today- will con't Keppra 4. Mood: LCSW to follow for evaluation and support.              -antipsychotic agents: Seroquel 25 2/21- off Seroquel 5. Neuropsych: This patient is capable of making decisions on his own behalf. 6. Skin/Wound Care:                          DTI with MASD: areas on buttocks improving.  Zinc for moisture barrier as well as pressure relief measures.    2/16- on regular bed that can stretch to his height- off specialty bed.  7. Fluids/Electrolytes/Nutrition: Monitor I/O. Encourage fluid intake. Continue to monitor lytes with serial checks.  8. Morbid obesity: Educate on importance of diet and weight loss to help promote overall health. Encourage side lying when in bed.  9. Pre-renal azotemia vs new CKD Stage IIIa: Improving off tube feeds--encourage fluid intake.   2/16- labs tomorrow- Cr issues likely due to Ibuprofen  2/17- Cr down to 1.41- and BUN 20- much improved- con't regimen for now  2/18- Cr down to 1.35- better  2/21- Cr back up to 1.63- is likely ibuprofen- will d/w pt tomorrow  2/22- d/c ibuprofen  2/24- Cr only down slightly to 1.59- not sure why- he's drinking a lot per pt and staff- will recheck Saturday.   2/25- labs tomorrow  3/1- Cr still 1.50- is drinking water alot- don't have anything can decrease to help kidney fxn.  3/3- has CKD Stage III  At this point- it's chronic.  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              Hemoglobin 9.5 on 2/3  overall stable- con't regimen 11. Encephalopathy: Has resolved. Continue Seroquel to help with sleep hygeine.  12.  Dysphagia: Resolved  2/2- PEG was placed 12/30- so cannot be removed yet  2/24- will remove PEG 2/25-2/26 since that's 8 weeks from 12/30  2/25- PEG removed today 13. Recent trach: Closed  2/9- has some trach site drainage- appears purulent- will start Keflex 500 mg BID (per CrCl) x 3 days- and see how  it goes  2/10- no drainage seen this AM, but will con't to monitor- WBC is better at 5.9k  2/13- stoma closed after silver nitrate, can use bandaid or no dressing  2/14- closed/healed  14.  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  Lyrica 50 mg TID_ explained won't help numbness/weakness, only nerve pain.  2/7- helping nerve pain a lot- con't regimen  2/16- added Duloxetine 30 mg nightly and Lyrica now 75 mg TID- not enough per pt- trying to see if Duloxetine will kick in.   2/17- add Lidocaine cream QID prn for R hand  2/18- no improvement with voltaren or lidocaine- with Cr going down, Monday can increase Duloxetine to 60 mg daily  2/24- Duloxetine at 60 mg- hasn't helped so far. Will try Keppra if no improvement  3/1- will do Keppra 250 mg BID  3/2- somewhat sedated- con't regimen and monitor  3/3- wants to continue Keppra- will wean Duloxetine, since wasn't helping. Will do 1 dose of Duoxetine 30 mg tonight, and then stop.  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 Muscle spasms  2/8- will try Skelaxin 800 mg TID prn for muscle spasms- since c/o severe muscle tightness  2/9- didn't complain of muscle spasms today  3/2- likes Skelaxin for muscle spasms- con't regimen 17. Previous HTN  2/11- refusing BP meds- will stop  2/19-20: hypotensive- continue to monitor.  18. Insomnia  2/11- stopped Seroquel per pt request 19. Loose stools  2/15- make colace prn  2/17- doing better- more regular- not loose- con't regimen 20. Bladder accidents/urgency/incontinence  2/21- Oxybutynin with the meds he's on, will dry him out too much- will try Myrbetriq for now 25 mg daily.   2/22- no improvement so far, but just started meds.   2/25- says is somewhat better-  con't regimen 21. High fall risk with L knee medial and lateral meniscus tear  2/24- had assisted fall in therapy today.   3/1- will get MRI of L knee since swollen and painful- have ordered. Think could have meniscal tear  3/2- medial and lateral meniscal tears and ACL sprain and large joint effusion/swelling- will ask Ortho to  come see and discuss surgery as well as knee injection and take fluid off knee.   3/3- Ortho didn't feel could do L knee injections since blood in joint- also couldn't aspirate- will need f/u with Ortho in [place] after d/c.       LOS: 36 days A FACE TO FACE EVALUATION WAS PERFORMED  Megan Lovorn 01/23/2021, 8:32 AM

## 2021-01-23 NOTE — Progress Notes (Signed)
Occupational Therapy Session Note  Patient Details  Name: Jackson Figueroa MRN: [id number] Date of Birth: Jun 29, 1961  Today's Date: 01/23/2021 OT Individual Time: 6256-3893 OT Individual Time Calculation (min): 25 min    Short Term Goals: Week 5:  OT Short Term Goal 1 (Week 5): STG=LTG 2/2 ELOS  Skilled Therapeutic Interventions/Progress Updates:    1;1. Pt received EOB and completes all squat pivot transfers with MOD I with increased time to set up w/c and subltle cuing to manage leg rests. Pt completes grooming at sink with set up and w/c propulsion with MOD I. In tx gym pt completes 2 sit to stands with CGA from elevated surface and lateral sway with CGA and no knee buckling. Exited session with pt seated in bed, exit alarm on and call light in reach   Therapy Documentation Precautions:  Precautions Precautions: Fall Precaution Comments: PEG, sacral and toe wounds Restrictions Weight Bearing Restrictions: No General:   Vital Signs: Therapy Vitals Temp: 98.4 F (36.9 C) Pulse Rate: 72 Resp: 18 BP: 138/66 Patient Position (if appropriate): Lying Oxygen Therapy SpO2: 95 % O2 Device: Room Air Pain: Pain Assessment Pain Scale: 0-10 Pain Score: 6  Pain Location: Knee Pain Orientation: Left Pain Intervention(s): Medication (See eMAR) ADL: ADL Eating: Independent Grooming: Independent Where Assessed-Grooming: Sitting at sink Upper Body Bathing: Setup Where Assessed-Upper Body Bathing: Shower Lower Body Bathing: Setup Where Assessed-Lower Body Bathing: Shower Upper Body Dressing: Independent Where Assessed-Upper Body Dressing: Wheelchair Lower Body Dressing: Minimal assistance Where Assessed-Lower Body Dressing: Teaching laboratory technician: Close supervision Social research officer, government Method: Education officer, environmental: Engineer, maintenance (IT)    Praxis   Exercises:   Other Treatments:     Therapy/Group: Individual  Therapy  [name] 01/23/2021, 6:48 AM

## 2021-01-24 DIAGNOSIS — G4700 Insomnia, unspecified: Secondary | ICD-10-CM

## 2021-01-24 DIAGNOSIS — S83207A Unspecified tear of unspecified meniscus, current injury, left knee, initial encounter: Secondary | ICD-10-CM

## 2021-01-24 DIAGNOSIS — D638 Anemia in other chronic diseases classified elsewhere: Secondary | ICD-10-CM

## 2021-01-24 MED ORDER — DOCUSATE SODIUM 50 MG PO CAPS: 50.0000 mg | ORAL_CAPSULE | Freq: Two times a day (BID) | ORAL | 0 refills | Status: DC

## 2021-01-24 MED ORDER — PREGABALIN 75 MG PO CAPS: 75.0000 mg | ORAL_CAPSULE | Freq: Three times a day (TID) | ORAL | 0 refills | Status: DC

## 2021-01-24 MED ORDER — METAXALONE 800 MG PO TABS
800.0000 mg | ORAL_TABLET | Freq: Three times a day (TID) | ORAL | 0 refills | Status: DC | PRN
Start: 2021-01-24 — End: 2021-01-28

## 2021-01-24 MED ORDER — ZINC OXIDE 11.3 % EX CREA: 1.0000 "application " | TOPICAL_CREAM | Freq: Every day | CUTANEOUS | 0 refills | Status: DC

## 2021-01-24 MED ORDER — LEVETIRACETAM 250 MG PO TABS: 250.0000 mg | ORAL_TABLET | Freq: Two times a day (BID) | ORAL | 0 refills | Status: DC

## 2021-01-24 MED ORDER — DICLOFENAC SODIUM 1 % EX GEL: 2.0000 g | Freq: Four times a day (QID) | CUTANEOUS | 0 refills | Status: AC

## 2021-01-24 MED ORDER — TRAMADOL HCL 50 MG PO TABS: 50.0000 mg | ORAL_TABLET | Freq: Four times a day (QID) | ORAL | 0 refills | Status: DC | PRN

## 2021-01-24 MED ORDER — DULOXETINE HCL 30 MG PO CPEP: 30.0000 mg | ORAL_CAPSULE | Freq: Every day | ORAL | 3 refills | Status: DC

## 2021-01-24 MED ORDER — HYDROCERIN EX CREA: 1.0000 "application " | TOPICAL_CREAM | Freq: Every day | CUTANEOUS | 0 refills | Status: DC

## 2021-01-24 MED ORDER — MIRABEGRON ER 25 MG PO TB24: 25.0000 mg | ORAL_TABLET | Freq: Every day | ORAL | 0 refills | Status: DC

## 2021-01-24 MED ORDER — FAMOTIDINE 20 MG PO TABS: 20.0000 mg | ORAL_TABLET | Freq: Two times a day (BID) | ORAL | 0 refills | Status: DC

## 2021-01-24 MED ORDER — PROSOURCE PLUS PO LIQD: 30.0000 mL | Freq: Two times a day (BID) | ORAL | 1 refills | Status: DC

## 2021-01-24 MED ORDER — TRAZODONE HCL 50 MG PO TABS: 50.0000 mg | ORAL_TABLET | Freq: Every day | ORAL | 0 refills | Status: DC

## 2021-01-24 MED ORDER — LIDOCAINE 5 % EX OINT: TOPICAL_OINTMENT | Freq: Four times a day (QID) | CUTANEOUS | 0 refills | Status: DC | PRN

## 2021-01-24 NOTE — Progress Notes (Signed)
[facility] PHYSICAL MEDICINE & REHABILITATION PROGRESS NOTE   Subjective/Complaints:   Pt ready for d/c today- a little sleepy, but wants to continue Roberta for now.   Asking if PA will send his meds to his pharmacy which I assured him would occur.   Doesn't plan on getting ramp- says family can get him in and out for doctor's appointments- has 1 with me in ~ 3-4 weeks and f/u with Ortho outpt as well as PCP.   ROS:    Pt denies SOB, abd pain, CP, N/V/C/D, and vision changes  Objective:   DG Knee Complete 4 Views Left  Result Date: 01/22/2021 CLINICAL DATA:  Effusion.  Recent fall. EXAM: LEFT KNEE - COMPLETE 4+ VIEW COMPARISON:  MRI 01/21/2021. FINDINGS: Tricompartment degenerative change. Degenerative changes most prominent about the medial compartment. No evidence of fracture or dislocation. Prominent knee joint effusion. Small calcific densities noted over popliteal space. These could represent loose bodies and/or a process such as synovial osteochondromas within previously identified large popliteal cyst. IMPRESSION: 1. Tricompartment degenerative change. Degenerative changes most prominent about the medial compartment. No acute bony abnormality identified. 2. Prominent knee joint effusion. 3. Small calcific densities noted over the popliteal space. These could represent loose bodies and or process such as synovial osteochondromas within the previously identified large popliteal cyst. Electronically Signed   By: Leonidas   On: 01/22/2021 12:58   No results for input(s): WBC, HGB, HCT, PLT in the last 72 hours. No results for input(s): NA, K, CL, CO2, GLUCOSE, BUN, CREATININE, CALCIUM in the last 72 hours.  Intake/Output Summary (Last 24 hours) at 01/24/2021 0847 Last data filed at 01/24/2021 6789 Gross per 24 hour  Intake 236 ml  Output 2100 ml  Net -1864 ml        Physical Exam: Vital Signs Blood pressure 133/73, pulse 79, temperature 98.1 F (36.7 C), resp. rate 18,  height 6' (1.829 m), weight 119.3 kg, SpO2 96 %.      General: awake, alert, appropriate, NAD- sitting up in bed-  HENT: conjugate gaze; oropharynx moist CV: regular rate and rhythm; no JVD Pulmonary: CTA B/L; no W/R/R- good air movement GI: soft, NT, ND, (+)BS Psychiatric: appropriate; stable slightly flat affect Neurological: Ox3 Ext: no clubbing, cyanosis, L knee moderate to severe swelling AND effusion- due to TTP over medial/lateral joint line, wrapped with ACE wraps currently- no change today Skin: Warm and dry.  Scab over PEG site. Sacrum- healed Right ulnar neuropathy- numbness in lateral 4th/5th digit (more in 5th digit numbness) on R with intrinsic atrophy- exam is consistent- no change- does have slightly better grip Motor: Right upper extremity: Shoulder abduction 4 --4/5, distally 3/5 Left upper extremity: 4/5 proximal distal Bilateral lower extremities: Hip flexion 4/5 B/L, knee extension 3 -/5 still in L knee 4/5 on R side, due to L knee pain, ankle dorsiflexion 4/5    Assessment/Plan: 1. Functional deficits which require 3+ hours per day of interdisciplinary therapy in a comprehensive inpatient rehab setting.  Physiatrist is providing close team supervision and 24 hour management of active medical problems listed below.  Physiatrist and rehab team continue to assess barriers to discharge/monitor patient progress toward functional and medical goals  Care Tool:  Bathing    Body parts bathed by patient: Right arm,Left arm,Chest,Abdomen,Front perineal area,Right upper leg,Left upper leg,Right lower leg,Left lower leg,Face,Buttocks   Body parts bathed by helper: Buttocks     Bathing assist Assist Level: Set up assist (per staff)  Upper Body Dressing/Undressing Upper body dressing   What is the patient wearing?: Pull over shirt    Upper body assist Assist Level: Independent (per staff)    Lower Body Dressing/Undressing Lower body dressing      What is  the patient wearing?: Pants     Lower body assist Assist for lower body dressing: Minimal Assistance - Patient > 75% (per staff)     Toileting Toileting    Toileting assist Assist for toileting: Contact Guard/Touching assist Assistive Device Comment: Urinal   Transfers Chair/bed transfer  Transfers assist  Chair/bed transfer activity did not occur: Safety/medical concerns (due to weakness/fatigue)  Chair/bed transfer assist level: Supervision/Verbal cueing     Locomotion Ambulation   Ambulation assist   Ambulation activity did not occur: Safety/medical concerns (due to weakness/fatigue)  Assist level: 2 helpers Assistive device: Walker-rolling Max distance: 14   Walk 10 feet activity   Assist  Walk 10 feet activity did not occur: Safety/medical concerns (due to weakness/fatigue)  Assist level: 2 helpers Assistive device: Walker-rolling   Walk 50 feet activity   Assist Walk 50 feet with 2 turns activity did not occur: Safety/medical concerns  Assist level: 2 helpers Assistive device: Walker-rolling    Walk 150 feet activity   Assist Walk 150 feet activity did not occur: Safety/medical concerns  Assist level: 2 helpers Assistive device: Walker-rolling    Walk 10 feet on uneven surface  activity   Assist Walk 10 feet on uneven surfaces activity did not occur: Safety/medical concerns         Wheelchair     Assist Will patient use wheelchair at discharge?: Yes Type of Wheelchair: Manual Wheelchair activity did not occur: Safety/medical concerns (due to weakness/fatigue)  Wheelchair assist level: Independent Max wheelchair distance: 200    Wheelchair 50 feet with 2 turns activity    Assist    Wheelchair 50 feet with 2 turns activity did not occur: Safety/medical concerns (due to weakness/fatigue)   Assist Level: Independent   Wheelchair 150 feet activity     Assist  Wheelchair 150 feet activity did not occur: Safety/medical  concerns (due to weakness/fatigue)   Assist Level: Independent   Blood pressure 133/73, pulse 79, temperature 98.1 F (36.7 C), resp. rate 18, height 6' (1.829 m), weight 119.3 kg, SpO2 96 %.    Medical Problem List and Plan: 1.  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 Will check.   3/2- PEG removed last week- con't foam dressing; said can't do much therapy due to L knee pain/swelling           Continue CIR PT, OT 2.  Antithrombotics: -DVT/anticoagulation:  Pharmaceutical: Continue Lovenox             -antiplatelet therapy: N/A 3. Pain Management: tylenol prn  tramadol 50 mg q6 hours prn  Continue Lyrica  Controlled meds on 2/5  2/11- reinforced I'd really like him to take Ibuprofen 1-2x/day- no more- and prefer 1x/day due to elevated Cr- he voiced understanding  2/14- increased lyrica to 75 mg TID yesterday- but with Cr up to 1.65 cannot increase more- can try Duloxetine 30 mg nightly for nerve pain  2/19: c/o ulnar nerve pain: increase Cymbalta to 40mg .     2/28: Keppra 250mg  daily started.  3/1- will change to BID since that's the normal starting dose.  3/2- was pretty sleepy yesterday- will con't for now, but monitor sedation  3/3- awake- no complaints about sedation today- will con't Keppra  3/4- will send home on keppra 250 mg BID-if tolerated, will increase to 500 mg BID at PM&R f/u.  4. Mood: LCSW to follow for evaluation and support.              -antipsychotic agents: Seroquel 25 2/21- off Seroquel 5. Neuropsych: This patient is capable of making decisions on his own behalf. 6. Skin/Wound Care:                          DTI with MASD: areas on buttocks improving.  Zinc for moisture barrier as well as pressure relief measures.    2/16- on regular bed that can stretch to his height- off specialty bed  3/4-  healed/resolved.  7. Fluids/Electrolytes/Nutrition: Monitor I/O. Encourage fluid intake. Continue to monitor lytes with serial checks.  8. Morbid obesity: Educate on importance of diet and weight loss to help promote overall health. Encourage side lying when in bed.  9. Pre-renal azotemia vs new CKD Stage IIIa: Improving off tube feeds--encourage fluid intake.   2/16- labs tomorrow- Cr issues likely due to Ibuprofen  2/17- Cr down to 1.41- and BUN 20- much improved- con't regimen for now  2/18- Cr down to 1.35- better  2/21- Cr back up to 1.63- is likely ibuprofen- will d/w pt tomorrow  2/22- d/c ibuprofen  2/24- Cr only down slightly to 1.59- not sure why- he's drinking a lot per pt and staff- will recheck Saturday.   3/1- Cr still 1.50- is drinking water alot- don't have anything can decrease to help kidney fxn.  3/3- has CKD Stage III  At this point- it's chronic.  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              Hemoglobin 9.5 on 2/3  overall stable- con't regimen 11. Encephalopathy: Has resolved. Continue Seroquel to help with sleep hygeine.  12.  Dysphagia: Resolved  2/2- PEG was placed 12/30- so cannot be removed yet  2/24- will remove PEG 2/25-2/26 since that's 8 weeks from 12/30  2/25- PEG removed today 13. Recent trach: Closed  2/9- has some trach site drainage- appears purulent- will start Keflex 500 mg BID (per CrCl) x 3 days- and see how it goes  2/10- no drainage seen this AM, but will con't to monitor- WBC is better at 5.9k  2/13- stoma closed after silver nitrate, can use bandaid or no dressing  2/14- closed/healed  14.  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  Lyrica 50 mg TID_ explained won't help numbness/weakness, only nerve pain.  2/7- helping nerve pain a lot- con't regimen  2/16- added Duloxetine 30 mg nightly and Lyrica now 75 mg TID- not enough per pt- trying to see if Duloxetine will  kick in.   2/17- add Lidocaine cream QID prn for R hand  2/18- no improvement with voltaren or lidocaine- with Cr going down, Monday can increase Duloxetine to 60 mg daily  2/24- Duloxetine at 60 mg- hasn't helped so far. Will try Keppra if no improvement  3/1- will do Keppra 250 mg BID  3/2- somewhat sedated- con't regimen and monitor  3/3- wants to continue Keppra- will wean Duloxetine, since wasn't helping. Will do 1 dose of Duoxetine 30 mg tonight, and then stop.  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 Muscle spasms  2/8- will try Skelaxin 800 mg TID prn for muscle spasms- since c/o severe muscle tightness  2/9- didn't complain of muscle spasms today  3/2- likes Skelaxin for muscle spasms- con't regimen 17. Previous HTN  2/11- refusing BP meds- will stop  2/19-20: hypotensive- continue to monitor.  18. Insomnia  2/11- stopped Seroquel per pt request 19. Loose stools  2/15- make colace prn  2/17- doing better- more regular- not loose- con't regimen 20. Bladder accidents/urgency/incontinence  2/21- Oxybutynin with the meds he's on, will dry him out too much- will try Myrbetriq for now 25 mg daily.   2/22- no improvement so far, but just started meds.   2/25- says is somewhat better- con't regimen 21. High fall risk with L knee medial and lateral meniscus tear  2/24- had assisted fall in therapy today.   3/1- will get MRI of L knee since swollen and painful- have ordered. Think could have meniscal tear  3/2- medial and lateral meniscal tears and ACL sprain and large joint effusion/swelling- will ask Ortho to come see and discuss surgery as well as knee injection and take fluid off knee.   3/3- Ortho didn't feel could do L knee injections since blood in joint- also couldn't aspirate- will  need f/u with Ortho in [place] after d/c. 22. Dispo  3/4- will need to f/u with PCP about CKD Stage III which is new, Ortho in [place], and needs f/u with me in ~ 4 weeks in clinic.        LOS: 37 days A FACE TO FACE EVALUATION WAS PERFORMED  Juanette Urizar 01/24/2021, 8:47 AM

## 2021-01-24 NOTE — Discharge Instructions (Signed)
Inpatient Rehab Discharge Instructions  Jackson Figueroa Discharge date and time:  01/24/21  Activities/Precautions/ Functional Status: Activity: no lifting, driving, or strenuous exercise till cleared by MD Diet: low fat, low cholesterol diet Wound Care: none needed    Functional status:  ___ No restrictions     ___ Walk up steps independently _X__ 24/7 supervision/assistance   ___ Walk up steps with assistance ___ Intermittent supervision/assistance  ___ Bathe/dress independently ___ Walk with walker     _X__ Bathe/dress with assistance ___ Walk Independently    ___ Shower independently ___ Walk with assistance    ___ Shower with assistance _X__ No alcohol     ___ Return to work/school ________   COMMUNITY REFERRALS UPON DISCHARGE:     Medical Equipment/Items Ordered: wheelchair, 3in1 bedside commode; leg extensions (to be shipped to the home)                                                 Agency/Supplier: [facility] [phone]   Special Instructions: 1. We will follow you for pain/rehab need but you need PCP to follow up for medical issues that occur. 2. Need to avoid NSAIDs (ibuprofen, aleve, motrin) or anything that may cause injury to your kidneys. Serum Creatine around 1.5 range.    My questions have been answered and I understand these instructions. I will adhere to these goals and the provided educational materials after my discharge from the hospital.  Patient/Caregiver Signature _______________________________ Date __________  Clinician Signature _______________________________________ Date __________  Please bring this form and your medication list with you to all your follow-up doctor's appointments.

## 2021-01-24 NOTE — Discharge Summary (Signed)
Physician Discharge Summary  Patient ID: Jackson Figueroa MRN: [id number] DOB/AGE: [birth year] 60 y.o.  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 No intravenous contrast was administered. COMPARISON:  None. FINDINGS: MENISCI Medial: There is a complex tear of the posterior horn of the medial meniscus which extends to the mid body. A of the midbody is displaced into the medial gutter. The tear also extends through the anterior horn of the medial meniscus. Lateral: Intact. LIGAMENTS Cruciates: Increased intrasubstance signal seen within the ACL, however it is intact. The PCL is intact. Collaterals: Increased signal seen within the MCL with probable tiny focal perforation the meniscofemoral deep ligament. The lateral collateral ligamentous complex is intact. CARTILAGE Patellofemoral: There is chondral fissuring seen within the central patellar apex and the lateral patellar facet. Medial compartment: Diffuse chondral thinning with near complete cartilage loss seen weight-bearing surface of the medial femoral condyle medial tibial plateau with marginal osteophyte formation. Lateral compartment: Mild chondral fissuring seen the weight-bearing surface of the lateral femoral condyle. BONES: No fracture. No avascular necrosis. No pathologic marrow infiltration. JOINT: A large knee  joint effusion is present with scattered debris. Edema seen within Hoffa's fat pad. No plical thickening. EXTENSOR MECHANISM: The patellar and quadriceps tendon are intact. The retinaculum is unremarkable. POPLITEAL FOSSA: There is a large 4 cm loculated popliteal cyst with evidence of recent partial rupture. OTHER: There is mild edema seen within the popliteus muscle belly. Increased signal seen at the insertion site of the medial head of the gastrocnemius. There is edema seen at the posterior popliteal fossa of the knee. There is also mildly increased signal seen vastus medialis. Increased signal with mild fatty atrophy seen within muscles of the lower extremity below the knee. IMPRESSION: 1. Complex tear of the medial meniscus with a small extruded portion displaced within the gutter. 2. Intrasubstance sprain of the ACL, however it is intact 3. Grade 1-2 medial collateral ligamentous injury with surrounding edema 4. 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 Moderate to large knee joint effusion with synovitis 6. Large loculated popliteal cyst with evidence of recent partial rupture 7. Insertional medial head gastrocnemius tendinosis Electronically Signed   By: Prudencio Pair M.D.   On: 01/21/2021 17:10   DG Knee Complete 4 Views Left  Result Date: 01/22/2021 CLINICAL DATA:  Effusion.  Recent fall. EXAM: LEFT KNEE - COMPLETE 4+ VIEW COMPARISON:  MRI 01/21/2021. FINDINGS: Tricompartment degenerative change. Degenerative changes most prominent about the medial compartment. No evidence of fracture or dislocation. Prominent knee joint effusion. Small calcific densities noted over popliteal space. These could represent loose bodies and/or a process such as synovial osteochondromas within previously identified large popliteal cyst. IMPRESSION: 1. Tricompartment degenerative change. Degenerative changes most prominent about the medial compartment. No acute bony abnormality  identified. 2. Prominent knee joint effusion. 3. Small calcific densities noted over the  popliteal space. These could represent loose bodies and or process such as synovial osteochondromas within the previously identified large popliteal cyst. Electronically Signed   By: [place]   On: 01/22/2021 12:58   IR GASTROSTOMY TUBE REMOVAL/REPAIR  Result Date: 01/17/2021 INDICATION: History of hypoxic respiratory failure related to COVID pneumonia requiring trach and gastrostomy tube for long-term support. He has progressed with rehabilitation and is no longer in need of his gastrostomy tube. Request is made for removal. EXAM: BEDSIDE REMOVAL OF GASTROSTOMY TUBE COMPARISON:  None. MEDICATIONS: 10 mL viscous lidocaine CONTRAST:  None FLUOROSCOPY TIME:  None COMPLICATIONS: None immediate. PROCEDURE: A time-out was performed prior to the initiation of the procedure. The track of the existing gastrostomy tube was lubricated with viscous lidocaine. Using manual traction, the existing pull-through gastrostomy tube was removed intact. A dressing was placed. The patient tolerated the procedure well without immediate postprocedural complication. IMPRESSION: Successful bedside removal of pull-through gastrostomy tube without complication. Read by: Brynda Greathouse PA-C Electronically Signed   By: Markus Daft M.D.   On: 01/17/2021 10:36    Labs:  Basic Metabolic Panel: Recent Labs  Lab 01/18/21 0505 01/20/21 0501 01/21/21 0540  NA 139 140 138  K 4.1 4.3 4.3  CL 104 105 104  CO2 $Re'27 27 26  'hmZ$ GLUCOSE 85 88 87  BUN 22* 24* 25*  CREATININE 1.44* 1.58* 1.50*  CALCIUM 9.3 9.3 9.1    CBC: CBC Latest Ref Rng & Units 01/20/2021 01/13/2021 01/06/2021  WBC 4.0 - 10.5 K/uL 5.2 4.1 5.7  Hemoglobin 13.0 - 17.0 g/dL 9.2(L) 8.8(L) 8.7(L)  Hematocrit 39.0 - 52.0 % 28.4(L) 25.6(L) 27.1(L)  Platelets 150 - 400 K/uL 213 192 217    CBG: No results for input(s): GLUCAP in the last 168 hours.  Brief HPI:   Jackson Figueroa is a  60 y.o. male with h/o morbid obesity who was originally admitted with Covid PNA and septic shock on 10/12/2020.  Patient not vaccinated against Covid and was treated with Covid protocol .  Hospital course complicated for AKI requiring CRRT as well as ARDS with prolonged ventilation requiring tracheostomy, development of DTI on sacrum as well as thigh and toes, ileus, PAF, encephalopathy, recurrent for fevers and required PEG placement for nutritional support.  Palliative care consulted to discuss [place] and family elected on full scope of care.  He tolerated extubation to ATC and was decannulated by 01/25.  Swallow function improved he was advanced to dysphagia to thin liquids with use of aspiration precautions.  Patient was noted to have diffuse weakness due to critical illness neuropathy/myopathy with balance deficits as well as pain affecting mobility and ADLs.  Therapy was ongoing and was working on pregait activity as well as endurance. CIR was recommended due to functional decline.   Hospital Course: Jackson Figueroa was admitted to rehab 12/18/2020 for inpatient therapies to consist of PT and OT at least three hours five days a week. Past admission physiatrist, therapy team and rehab RN have worked together to provide customized collaborative inpatient rehab.  His tracheal stoma has closed up and healed over.  Swallow function improved diet was advanced to regular and he is tolerating this without difficulty.  Nutritional supplements were added to help with malnourished state.  PEG tube was removed without difficulty on 02/25 by radiology.  Anemia of critical illness have been monitored with serial H&H and is stable overall.  DTI with MAST on buttocks has completely resolved and air mattress was used for pressure relief  measures.  Serial check of electrolytes shows patient with evidence of chronic kidney disease.  He did insist on use of NSAIDs for management of chronic knee pain however this did show worsening of  renal status therefore was discontinued and serum creatinine currently stabilizing around 1.5 range.  Blood pressures were monitored on TID basis and has been relatively stable.  Seroquel was DC'd and trazodone was scheduled to help manage insomnia.  He has had issues with significant neuropathy especially with increase in activity.  Lyrica, Cymbalta as well as low-dose Keppra was added with improvement in symptoms.  Skelaxin is also been used on as needed basis to help with muscle spasms.  He did have an assisted fall with therapy onto his knees with worsening of left greater than right knee pain as well as decline in mobility.   MRI of knees done revealing medial and lateral meniscus tears and ACL sprain as well as large joint effusion swelling in the left knee.  Ortho was consulted for input and recommended compression and local measures as well as follow-up with outpatient orthopedics if symptoms did not improve.  Voltaren gel, tramadol as well as Ace wrap to use for management of pain. He has made good gains during his rehab stay and is currently at min assist to supervision level. Follow up therapy unavailable due to out of network issues and they were advised to follow up with [facility] regarding out of network coverage.     Rehab course: During patient's stay in rehab weekly team conferences were held to monitor patient's progress, set goals and discuss barriers to discharge. At admission, patient required total assist with ADL tasks and with mobility.  Speech therapy evaluated swallow function and felt that patient could be advanced to regular textures with aspiration precautions as was tolerating diet without difficulty.  As he was tolerating current diet without overt S/S of aspiration the signed off on 01/31.  He has had improvement in activity tolerance, balance, postural control as well as ability to compensate for deficits.  He requires contact-guard to close supervision for squat pivot transfers and  is able to complete ADL tasks with supervision to occasional min assist.  He is able to propel his wheelchair for greater than 20 feet at modified independent level.  He is able to walk up to 14 feet with rolling walker and min assist with cues for posture and to achieve knee extension. thout overt signs or symptoms of aspiration. Family education has been completed with girlfriend.   Discharge disposition: 01-Home or Self Care  Diet:  Regular  Special Instructions: 1. Needs to avoid NSAIDS due to CKD.  2.    Discharge Instructions    Ambulatory referral to Physical Medicine Rehab   Complete by: As directed    3-4 weeks follow up appointment     Allergies as of 01/24/2021   No Known Allergies     Medication List    STOP taking these medications   albuterol (2.5 MG/3ML) 0.083% nebulizer solution Commonly known as: PROVENTIL   chlorhexidine gluconate (MEDLINE KIT) 0.12 % solution Commonly known as: PERIDEX   liver oil-zinc oxide 40 % ointment Commonly known as: DESITIN Replaced by: zinc oxide 11.3 % Crea cream   metoprolol tartrate 25 MG tablet Commonly known as: LOPRESSOR   QUEtiapine 25 MG tablet Commonly known as: SEROQUEL   Resource ThickenUp Clear Powd     TAKE these medications   (feeding supplement) PROSource Plus liquid Take 30 mLs by mouth  2 (two) times daily between meals. What changed:   how much to take  when to take this   acetaminophen 325 MG tablet Commonly known as: TYLENOL Take 1-2 tablets (325-650 mg total) by mouth every 4 (four) hours as needed for mild pain. What changed:   how much to take  when to take this  reasons to take this   diclofenac Sodium 1 % Gel Commonly known as: VOLTAREN Apply 2 g topically 4 (four) times daily. Notes to patient: Needs to be used at least 3 times a day to be effective   docusate sodium 50 MG capsule Commonly known as: COLACE Take 1 capsule (50 mg total) by mouth 2 (two) times daily.   DULoxetine  30 MG capsule Commonly known as: CYMBALTA Take 1 capsule (30 mg total) by mouth at bedtime.   famotidine 20 MG tablet Commonly known as: PEPCID Take 1 tablet (20 mg total) by mouth 2 (two) times daily.   hydrocerin Crea Apply 1 application topically daily.   levETIRAcetam 250 MG tablet Commonly known as: KEPPRA Take 1 tablet (250 mg total) by mouth 2 (two) times daily.   lidocaine 5 % ointment Commonly known as: XYLOCAINE Apply topically 4 (four) times daily as needed (for nerve pain in R hand).   lip balm ointment Apply 1 application topically as needed for lip care (cold sores).   metaxalone 800 MG tablet Commonly known as: SKELAXIN Take 1 tablet (800 mg total) by mouth 3 (three) times daily as needed for muscle spasms.   mirabegron ER 25 MG Tb24 tablet Commonly known as: MYRBETRIQ Take 1 tablet (25 mg total) by mouth at bedtime.   multivitamin with minerals Tabs tablet Take 1 tablet by mouth daily.   pregabalin 75 MG capsule Commonly known as: LYRICA Take 1 capsule (75 mg total) by mouth 3 (three) times daily.   traMADol 50 MG tablet Commonly known as: ULTRAM Take 1 tablet (50 mg total) by mouth every 6 (six) hours as needed for severe pain.   traZODone 50 MG tablet Commonly known as: DESYREL Take 1 tablet (50 mg total) by mouth at bedtime.   zinc oxide 11.3 % Crea cream Commonly known as: BALMEX Apply 1 application topically daily. Replaces: liver oil-zinc oxide 40 % ointment       Follow-up Information    L[place], Jinny Blossom, MD Follow up.   Specialty: Physical Medicine and Rehabilitation Why: Office will call you with follow up appointment.  Contact information: [address] [place] [address] [phone]        Carole Civil, MD. Call.   Specialties: Orthopedic Surgery, Radiology Why: as needed Contact information: [address] [place] [address] [phone]        Gildardo Pounds, NP Follow up on 02/17/2021.    Specialty: Nurse Practitioner Why: Appointment at 2:30 pm for hostpital follow up. Call and cancel  Contact information: [place] [address] [phone]        Lindell Spar, MD Follow up on 03/06/2021.   Specialty: Internal Medicine Why: Appointment at 10:20 am Contact information: [address] [place] [address] [phone]               Signed: Bary Leriche 01/24/2021, 4:11 PM

## 2021-01-27 NOTE — Progress Notes (Signed)
Inpatient Rehabilitation Care Coordinator Discharge Note  The overall goal for the admission was met for:   Discharge location: Yes. D/c to home  Length of Stay: Yes. 36 days  Discharge activity level: Yes. Min A  Home/community participation: Yes  Services provided included: MD, RD, PT, OT, RN, CM, TR, Pharmacy, Neuropsych and SW  Financial Services: Private Insurance: [employer] offered to/list presented to:Yes   Follow-up services arranged: Home Health: Unable to Obtain due to staffing or insurance. DME- [facility] for w/c and 3in1 BSC. articulating leg rests to be shipped to patient's home.   Comments (or additional information):  Patient/Family verbalized understanding of follow-up arrangements: Yes  Individual responsible for coordination of the follow-up plan: contact pt [phone]  Confirmed correct DME delivered: Rana Snare 01/27/2021    Rana Snare

## 2021-01-28 ENCOUNTER — Inpatient Hospital Stay (HOSPITAL_COMMUNITY)
Admission: EM | Admit: 2021-01-28 | Discharge: 2021-01-29 | DRG: 155 | Disposition: A | Discharge status: Home / Self Care | Payer: BC Managed Care – PPO | Attending: Internal Medicine | Admitting: Internal Medicine

## 2021-01-28 ENCOUNTER — Encounter (HOSPITAL_COMMUNITY): Payer: Self-pay | Admitting: *Deleted

## 2021-01-28 ENCOUNTER — Other Ambulatory Visit: Payer: Self-pay

## 2021-01-28 ENCOUNTER — Emergency Department (HOSPITAL_COMMUNITY): Payer: BC Managed Care – PPO

## 2021-01-28 ENCOUNTER — Telehealth: Payer: Self-pay

## 2021-01-28 DIAGNOSIS — K1120 Sialoadenitis, unspecified: Secondary | ICD-10-CM | POA: Diagnosis not present

## 2021-01-28 DIAGNOSIS — E669 Obesity, unspecified: Secondary | ICD-10-CM | POA: Diagnosis not present

## 2021-01-28 DIAGNOSIS — T7840XA Allergy, unspecified, initial encounter: Secondary | ICD-10-CM | POA: Diagnosis not present

## 2021-01-28 DIAGNOSIS — Z87891 Personal history of nicotine dependence: Secondary | ICD-10-CM | POA: Diagnosis not present

## 2021-01-28 DIAGNOSIS — Z20822 Contact with and (suspected) exposure to covid-19: Secondary | ICD-10-CM | POA: Diagnosis not present

## 2021-01-28 DIAGNOSIS — Z8249 Family history of ischemic heart disease and other diseases of the circulatory system: Secondary | ICD-10-CM

## 2021-01-28 DIAGNOSIS — R1310 Dysphagia, unspecified: Secondary | ICD-10-CM | POA: Diagnosis present

## 2021-01-28 DIAGNOSIS — Z6834 Body mass index (BMI) 34.0-34.9, adult: Secondary | ICD-10-CM

## 2021-01-28 DIAGNOSIS — F32A Depression, unspecified: Secondary | ICD-10-CM | POA: Diagnosis not present

## 2021-01-28 DIAGNOSIS — G40909 Epilepsy, unspecified, not intractable, without status epilepticus: Secondary | ICD-10-CM | POA: Diagnosis not present

## 2021-01-28 DIAGNOSIS — G629 Polyneuropathy, unspecified: Secondary | ICD-10-CM | POA: Diagnosis not present

## 2021-01-28 DIAGNOSIS — N182 Chronic kidney disease, stage 2 (mild): Secondary | ICD-10-CM | POA: Diagnosis present

## 2021-01-28 DIAGNOSIS — R221 Localized swelling, mass and lump, neck: Secondary | ICD-10-CM | POA: Diagnosis not present

## 2021-01-28 DIAGNOSIS — L03211 Cellulitis of face: Secondary | ICD-10-CM | POA: Diagnosis not present

## 2021-01-28 DIAGNOSIS — G6281 Critical illness polyneuropathy: Secondary | ICD-10-CM | POA: Diagnosis not present

## 2021-01-28 DIAGNOSIS — Z79899 Other long term (current) drug therapy: Secondary | ICD-10-CM

## 2021-01-28 DIAGNOSIS — Z8616 Personal history of COVID-19: Secondary | ICD-10-CM

## 2021-01-28 DIAGNOSIS — K219 Gastro-esophageal reflux disease without esophagitis: Secondary | ICD-10-CM | POA: Diagnosis present

## 2021-01-28 DIAGNOSIS — R609 Edema, unspecified: Secondary | ICD-10-CM | POA: Diagnosis not present

## 2021-01-28 LAB — BASIC METABOLIC PANEL
Anion gap: 9 (ref 5–15)
BUN: 22 mg/dL — ABNORMAL HIGH (ref 6–20)
CO2: 25 mmol/L (ref 22–32)
Calcium: 9.4 mg/dL (ref 8.9–10.3)
Chloride: 103 mmol/L (ref 98–111)
Creatinine, Ser: 1.32 mg/dL — ABNORMAL HIGH (ref 0.61–1.24)
GFR, Estimated: >60 mL/min (ref >60)
Glucose, Bld: 99 mg/dL (ref 70–99)
Potassium: 4 mmol/L (ref 3.5–5.1)
Sodium: 137 mmol/L (ref 135–145)

## 2021-01-28 LAB — CBC WITH DIFFERENTIAL/PLATELET
Abs Immature Granulocytes: 0.02 10*3/uL (ref 0.00–0.07)
Basophils Absolute: 0 10*3/uL (ref 0.0–0.1)
Basophils Relative: 0 %
Eosinophils Absolute: 0.3 10*3/uL (ref 0.0–0.5)
Eosinophils Relative: 4 %
HCT: 30.3 % — ABNORMAL LOW (ref 39.0–52.0)
Hemoglobin: 10.1 g/dL — ABNORMAL LOW (ref 13.0–17.0)
Immature Granulocytes: 0 %
Lymphocytes Relative: 14 %
Lymphs Abs: 1.1 10*3/uL (ref 0.7–4.0)
MCH: 32.5 pg (ref 26.0–34.0)
MCHC: 33.3 g/dL (ref 30.0–36.0)
MCV: 97.4 fL (ref 80.0–100.0)
Monocytes Absolute: 1 10*3/uL (ref 0.1–1.0)
Monocytes Relative: 12 %
Neutro Abs: 5.7 10*3/uL (ref 1.7–7.7)
Neutrophils Relative %: 70 %
Platelets: 212 10*3/uL (ref 150–400)
RBC: 3.11 MIL/uL — ABNORMAL LOW (ref 4.22–5.81)
RDW: 13.1 % (ref 11.5–15.5)
WBC: 8.2 10*3/uL (ref 4.0–10.5)
nRBC: 0 % (ref 0.0–0.2)

## 2021-01-28 LAB — RESP PANEL BY RT-PCR (FLU A&B, COVID) ARPGX2
Influenza A by PCR: NEGATIVE
Influenza B by PCR: NEGATIVE
SARS Coronavirus 2 by RT PCR: NEGATIVE

## 2021-01-28 LAB — HIV ANTIBODY (ROUTINE TESTING W REFLEX): HIV Screen 4th Generation wRfx: NONREACTIVE

## 2021-01-28 LAB — PHOSPHORUS: Phosphorus: 5.3 mg/dL — ABNORMAL HIGH (ref 2.5–4.6)

## 2021-01-28 LAB — MAGNESIUM: Magnesium: 1.8 mg/dL (ref 1.7–2.4)

## 2021-01-28 MED ORDER — ACETAMINOPHEN 325 MG PO TABS: 650.0000 mg | ORAL_TABLET | Freq: Four times a day (QID) | ORAL | Status: DC | PRN

## 2021-01-28 MED ORDER — FENTANYL CITRATE (PF) 100 MCG/2ML IJ SOLN: 50.0000 ug | INTRAMUSCULAR | Status: DC | PRN

## 2021-01-28 MED ORDER — CLINDAMYCIN PHOSPHATE 900 MG/50ML IV SOLN
900.0000 mg | Freq: Once | INTRAVENOUS | Status: AC
  Administered 2021-01-28: 900 mg via INTRAVENOUS
  Filled 2021-01-28: qty 50

## 2021-01-28 MED ORDER — LEVETIRACETAM 250 MG PO TABS
250.0000 mg | ORAL_TABLET | Freq: Two times a day (BID) | ORAL | Status: DC
Start: 2021-01-28 — End: 2021-01-29
  Administered 2021-01-28 – 2021-01-29 (×2): 250 mg via ORAL
  Filled 2021-01-28 (×4): qty 1

## 2021-01-28 MED ORDER — DEXAMETHASONE SODIUM PHOSPHATE 10 MG/ML IJ SOLN
10.0000 mg | Freq: Two times a day (BID) | INTRAMUSCULAR | Status: DC
  Administered 2021-01-28 – 2021-01-29 (×2): 10 mg via INTRAVENOUS
  Filled 2021-01-28 (×3): qty 1

## 2021-01-28 MED ORDER — DEXAMETHASONE SODIUM PHOSPHATE 10 MG/ML IJ SOLN
10.0000 mg | Freq: Once | INTRAMUSCULAR | Status: AC
  Administered 2021-01-28: 10 mg via INTRAVENOUS
  Filled 2021-01-28: qty 1

## 2021-01-28 MED ORDER — ADULT MULTIVITAMIN W/MINERALS CH
1.0000 | ORAL_TABLET | Freq: Every day | ORAL | Status: DC
  Administered 2021-01-28 – 2021-01-29 (×2): 1 via ORAL
  Filled 2021-01-28 (×2): qty 1

## 2021-01-28 MED ORDER — ONDANSETRON HCL 4 MG PO TABS: 4.0000 mg | ORAL_TABLET | Freq: Four times a day (QID) | ORAL | Status: DC | PRN

## 2021-01-28 MED ORDER — ONDANSETRON HCL 4 MG/2ML IJ SOLN: 4.0000 mg | Freq: Four times a day (QID) | INTRAMUSCULAR | Status: DC | PRN

## 2021-01-28 MED ORDER — DEXAMETHASONE SODIUM PHOSPHATE 10 MG/ML IJ SOLN: 10.0000 mg | Freq: Two times a day (BID) | INTRAMUSCULAR | Status: DC

## 2021-01-28 MED ORDER — MIRABEGRON ER 25 MG PO TB24
25.0000 mg | ORAL_TABLET | Freq: Every day | ORAL | Status: DC
Start: 2021-01-28 — End: 2021-01-29
  Filled 2021-01-28: qty 1

## 2021-01-28 MED ORDER — FAMOTIDINE 20 MG PO TABS
20.0000 mg | ORAL_TABLET | Freq: Two times a day (BID) | ORAL | Status: DC
  Administered 2021-01-28 – 2021-01-29 (×2): 20 mg via ORAL
  Filled 2021-01-28 (×2): qty 1

## 2021-01-28 MED ORDER — DULOXETINE HCL 30 MG PO CPEP
30.0000 mg | ORAL_CAPSULE | Freq: Every day | ORAL | Status: DC
Start: 2021-01-29 — End: 2021-01-29

## 2021-01-28 MED ORDER — PREGABALIN 75 MG PO CAPS
75.0000 mg | ORAL_CAPSULE | Freq: Three times a day (TID) | ORAL | Status: DC
  Administered 2021-01-28 – 2021-01-29 (×2): 75 mg via ORAL
  Filled 2021-01-28 (×2): qty 1

## 2021-01-28 MED ORDER — HEPARIN SODIUM (PORCINE) 5000 UNIT/ML IJ SOLN
5000.0000 [IU] | Freq: Three times a day (TID) | INTRAMUSCULAR | Status: DC
  Administered 2021-01-28 – 2021-01-29 (×3): 5000 [IU] via SUBCUTANEOUS
  Filled 2021-01-28 (×3): qty 1

## 2021-01-28 MED ORDER — SODIUM CHLORIDE 0.9 % IV SOLN
1.0000 g | Freq: Once | INTRAVENOUS | Status: AC
  Administered 2021-01-28: 1 g via INTRAVENOUS
  Filled 2021-01-28: qty 10

## 2021-01-28 MED ORDER — TRAZODONE HCL 50 MG PO TABS
50.0000 mg | ORAL_TABLET | Freq: Every day | ORAL | Status: DC
  Administered 2021-01-28: 50 mg via ORAL
  Filled 2021-01-28: qty 1

## 2021-01-28 MED ORDER — ACETAMINOPHEN 650 MG RE SUPP: 650.0000 mg | Freq: Four times a day (QID) | RECTAL | Status: DC | PRN

## 2021-01-28 MED ORDER — IOHEXOL 300 MG/ML  SOLN
75.0000 mL | Freq: Once | INTRAMUSCULAR | Status: AC | PRN
  Administered 2021-01-28: 75 mL via INTRAVENOUS

## 2021-01-28 NOTE — ED Provider Notes (Signed)
[facility] EMERGENCY DEPARTMENT Provider Note   CSN: [id number] Arrival date & time: 01/28/21  7169     History Chief Complaint  Patient presents with  . Facial Swelling    Jackson Figueroa is a 60 y.o. male.  Pt presents to the ED today with right sided facial swelling.  Pt said he noticed the swelling yesterday.  Pt denies any fevers.  He denies any sore throat or pain in his teeth.  Pt does have a recent hx of severe Covid.  He was admitted on 11/28 and d/c on 1/26 to inpatient rehab.  He just left there on 3/4.  He did have a trach placed while admitted, but that has since been removed.  He denies any sob.         Past Medical History:  Diagnosis Date  . Allergic   . Knee pain, bilateral    due to old injuires  . Numbness and tingling in right hand    and elbow due to sports injury  . Obesity   . Staph infection    of appy wound--treated with antibiotics X 4 weeks.   . Tendinitis of elbow    right    Patient Active Problem List   Diagnosis Date Noted  . Facial cellulitis 01/28/2021  . Anemia of chronic illness 01/24/2021  . Insomnia 01/24/2021  . Tears of meniscus and ACL of left knee 01/24/2021  . Chronic kidney disease 01/22/2021  . 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 S/P percutaneous endoscopic gastrostomy (PEG) tube placement ([place])   . Neuropathic pain   . Debility 12/18/2020  . Dysphagia   . 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 Critical illness myopathy   . Physical deconditioning   . Tracheostomy care ([place])   . Acute hypercapnic respiratory failure ([place])   . Malnutrition of moderate degree 11/30/2020  . 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 Ileus ([place])   . Acute respiratory failure with hypoxemia ([place])   . AKI (acute kidney injury) ([place]) 11/11/2020  . Protein calorie malnutrition ([place]) 11/11/2020  . Acute metabolic encephalopathy 67/89/3810  . Acute respiratory distress syndrome (ARDS) due to COVID-19 virus  ([place])   . Septic shock ([place])   . Acute respiratory failure due to COVID-19 [facility]) 10/20/2020    Past Surgical History:  Procedure Laterality Date  . APPENDECTOMY  1982  . INGUINAL HERNIA REPAIR Left 2000  . IR GASTROSTOMY TUBE MOD SED  11/21/2020  . IR GASTROSTOMY TUBE REMOVAL  01/17/2021  . KNEE ARTHROSCOPY     bilateral knees  . TONSILLECTOMY AND ADENOIDECTOMY    . TRACHEOSTOMY TUBE PLACEMENT N/A 11/14/2020   Procedure: TRACHEOSTOMY;  Surgeon: Melida Quitter, MD;  Location: [facility] ORS;  Service: ENT;  Laterality: N/A;       Family History  Problem Relation Age of Onset  . Cerebral aneurysm Mother 73  . Heart disease Father   . Migraines Sister     Social History   Tobacco Use  . Smoking status: Former Smoker    Packs/day: 2.00    Types: Cigarettes    Quit date: 03/23/2012    Years since quitting: 8.8  . Smokeless tobacco: Never Used  Vaping Use  . Vaping Use: Never used  Substance Use Topics  . Alcohol use: Yes    Alcohol/week: 2.0 standard drinks    Types: 2 Glasses of wine per week    Comment: occasional wine  . Drug use: Never  Home Medications Prior to Admission medications   Medication Sig Start Date End Date Taking? Authorizing Provider  acetaminophen (TYLENOL) 325 MG tablet Take 1-2 tablets (325-650 mg total) by mouth every 4 (four) hours as needed for mild pain. 01/22/21   Love, Ivan Anchors, PA-C  diclofenac Sodium (VOLTAREN) 1 % GEL Apply 2 g topically 4 (four) times daily. 01/24/21   Love, Ivan Anchors, PA-C  docusate sodium (COLACE) 50 MG capsule Take 1 capsule (50 mg total) by mouth 2 (two) times daily. 01/24/21   Love, Ivan Anchors, PA-C  DULoxetine (CYMBALTA) 30 MG capsule Take 1 capsule (30 mg total) by mouth at bedtime. 01/24/21   Love, Ivan Anchors, PA-C  famotidine (PEPCID) 20 MG tablet Take 1 tablet (20 mg total) by mouth 2 (two) times daily. 01/24/21   Love, Ivan Anchors, PA-C  hydrocerin (EUCERIN) CREA Apply 1 application topically daily. 01/24/21   Love, Ivan Anchors, PA-C   levETIRAcetam (KEPPRA) 250 MG tablet Take 1 tablet (250 mg total) by mouth 2 (two) times daily. 01/24/21   Love, Ivan Anchors, PA-C  lidocaine (XYLOCAINE) 5 % ointment Apply topically 4 (four) times daily as needed (for nerve pain in R hand). 01/24/21   Love, Ivan Anchors, PA-C  lip balm (CARMEX) ointment Apply 1 application topically as needed for lip care (cold sores). 12/18/20   Raiford Noble Latif, DO  metaxalone (SKELAXIN) 800 MG tablet Take 1 tablet (800 mg total) by mouth 3 (three) times daily as needed for muscle spasms. 01/24/21   Love, Ivan Anchors, PA-C  mirabegron ER (MYRBETRIQ) 25 MG TB24 tablet Take 1 tablet (25 mg total) by mouth at bedtime. 01/24/21   Love, Ivan Anchors, PA-C  Multiple Vitamin (MULTIVITAMIN WITH MINERALS) TABS tablet Take 1 tablet by mouth daily.    [provider]  Nutritional Supplements (,FEEDING SUPPLEMENT, PROSOURCE PLUS) liquid Take 30 mLs by mouth 2 (two) times daily between meals. 01/24/21   Love, Ivan Anchors, PA-C  pregabalin (LYRICA) 75 MG capsule Take 1 capsule (75 mg total) by mouth 3 (three) times daily. 01/24/21   Love, Ivan Anchors, PA-C  traMADol (ULTRAM) 50 MG tablet Take 1 tablet (50 mg total) by mouth every 6 (six) hours as needed for severe pain. 01/24/21   Love, Ivan Anchors, PA-C  traZODone (DESYREL) 50 MG tablet Take 1 tablet (50 mg total) by mouth at bedtime. 01/24/21   Love, Ivan Anchors, PA-C  zinc oxide (BALMEX) 11.3 % CREA cream Apply 1 application topically daily. 01/24/21   Bary Leriche, PA-C    Allergies    Patient has no known allergies.  Review of Systems   Review of Systems  HENT: Positive for facial swelling.   All other systems reviewed and are negative.   Physical Exam Updated Vital Signs BP (!) 130/91   Pulse 80   Temp 98.4 F (36.9 C) (Oral)   Resp 12   Ht 6\' 1"  (1.854 m)   Wt 117.9 kg   SpO2 99%   BMI 34.30 kg/m   Physical Exam Vitals and nursing note reviewed.  Constitutional:      Appearance: Normal appearance. He is obese.  HENT:      Head: Normocephalic and atraumatic.     Right Ear: External ear normal.     Left Ear: External ear normal.     Nose: Nose normal.     Mouth/Throat:     Mouth: Mucous membranes are moist.     Pharynx: Oropharynx is clear.  Eyes:  Extraocular Movements: Extraocular movements intact.     Conjunctiva/sclera: Conjunctivae normal.     Pupils: Pupils are equal, round, and reactive to light.  Neck:     Comments: Right sided facial pain and neck swelling.  No stridor. Cardiovascular:     Rate and Rhythm: Normal rate and regular rhythm.     Pulses: Normal pulses.     Heart sounds: Normal heart sounds.  Pulmonary:     Effort: Pulmonary effort is normal.     Breath sounds: Normal breath sounds.  Abdominal:     General: Abdomen is flat. Bowel sounds are normal.     Palpations: Abdomen is soft.  Musculoskeletal:        General: Normal range of motion.  Skin:    General: Skin is warm.     Capillary Refill: Capillary refill takes less than 2 seconds.  Neurological:     General: No focal deficit present.     Mental Status: He is alert and oriented to person, place, and time.  Psychiatric:        Mood and Affect: Mood normal.        Behavior: Behavior normal.        Thought Content: Thought content normal.        Judgment: Judgment normal.     ED Results / Procedures / Treatments   Labs (all labs ordered are listed, but only abnormal results are displayed) Labs Reviewed  BASIC METABOLIC PANEL - Abnormal; Notable for the following components:      Result Value   BUN 22 (*)    Creatinine, Ser 1.32 (*)    All other components within normal limits  CBC WITH DIFFERENTIAL/PLATELET - Abnormal; Notable for the following components:   RBC 3.11 (*)    Hemoglobin 10.1 (*)    HCT 30.3 (*)    All other components within normal limits  RESP PANEL BY RT-PCR (FLU A&B, COVID) ARPGX2  MAGNESIUM  PHOSPHORUS  HIV ANTIBODY (ROUTINE TESTING W REFLEX)    EKG None  Radiology CT Soft Tissue  Neck W Contrast  Result Date: 01/28/2021 CLINICAL DATA:  Right-sided throat swelling. EXAM: CT NECK WITH CONTRAST TECHNIQUE: Multidetector CT imaging of the neck was performed using the standard protocol following the bolus administration of intravenous contrast. CONTRAST:  74mL OMNIPAQUE IOHEXOL 300 MG/ML  SOLN COMPARISON:  None. FINDINGS: Pharynx and larynx: Advanced and widespread inflammatory changes of the right face and neck. There is marked swelling and edema of the right lateral oro pharyngeal to hypopharyngeal region. Marked swelling and edema present affecting the other tissues of the right side of the face and upper neck with widespread subcutaneous edema, massive swelling and inflammatory change of the right parotid gland, mild swelling and inflammatory change of the right submandibular gland, swelling and inflammatory change of the right sternocleidomastoid muscle, and mild reactive nodal prominence without visible suppuration. Because of the trans spatial nature of this, the findings are highly likely to be due to infectious inflammation. Because of the advanced nature, it is very difficult to determine where the process originated. The appearance of the oropharynx and hypopharynx is somewhat concerning and while certainly it may just simply represent advanced inflammation, I cannot rule out the possibility of a mass in that region in follow-up would be suggested after treatment of the infection. Narrowing of the hypopharynx due to the swelling which, if it worsens, could result in airway compromise. Salivary glands: See above. Markedly inflamed right parotid gland and moderately inflamed  right submandibular gland. There is some possibility there could be early inflammation of the left submandibular gland as well. Left parotid is normal. Thyroid: Normal Lymph nodes: As noted above, there are reactive nodes on the right secondary to the right-sided inflammatory disease. Vascular: Mild atherosclerotic  change at the carotid bifurcations. Limited intracranial: Normal Visualized orbits: Normal Mastoids and visualized paranasal sinuses: Clear paranasal sinuses. Fluid opacification of the mastoid air cells on the right without evidence of bone destruction. Skeleton: Ordinary cervical spondylosis. Upper chest: Scarring at the lung apices. Other: None IMPRESSION: 1. Advanced and widespread inflammatory changes of the right side of the face and neck as outlined above. Because of the advanced nature of this, it is very difficult to determine where the process originated. The appearance of the oropharynx and hypopharynx is somewhat concerning and while certainly it may just simply represent advanced inflammation, I cannot rule out the possibility of a mass in that region in that region in follow-up would be suggested after treatment of the infection. There is some narrowing the airway in the oro pharyngeal to hypopharyngeal region which could be especially concerning if it worsens further. 2. Fluid opacification of the mastoid air cells on the right without evidence of bone destruction. Electronically Signed   By: Nelson Chimes M.D.   On: 01/28/2021 10:02    Procedures Procedures   Medications Ordered in ED Medications  heparin injection 5,000 Units (has no administration in time range)  dexamethasone (DECADRON) injection 10 mg (has no administration in time range)  acetaminophen (TYLENOL) tablet 650 mg (has no administration in time range)    Or  acetaminophen (TYLENOL) suppository 650 mg (has no administration in time range)  fentaNYL (SUBLIMAZE) injection 50 mcg (has no administration in time range)  ondansetron (ZOFRAN) tablet 4 mg (has no administration in time range)    Or  ondansetron (ZOFRAN) injection 4 mg (has no administration in time range)  clindamycin (CLEOCIN) IVPB 900 mg (0 mg Intravenous Stopped 01/28/21 1113)  iohexol (OMNIPAQUE) 300 MG/ML solution 75 mL (75 mLs Intravenous Contrast Given  01/28/21 0949)  dexamethasone (DECADRON) injection 10 mg (10 mg Intravenous Given 01/28/21 1111)  cefTRIAXone (ROCEPHIN) 1 g in sodium chloride 0.9 % 100 mL IVPB (1 g Intravenous New Bag/Given 01/28/21 1113)    ED Course  I have reviewed the triage vital signs and the nursing notes.  Pertinent labs & imaging results that were available during my care of the patient were reviewed by me and considered in my medical decision making (see chart for details).    MDM Rules/Calculators/A&P                          Pt d/w Dr. Janace Hoard (ENT for [facility]).  He recommended hospitalist admission to [facility] and ENT consult when he arrived.  It looks like Dr. Redmond Baseman did pt's trach and he is in a different group than Dr. Redmond Baseman.  Dr. Janace Hoard said for the hospitalist to call Dr. Janace Hoard when pt arrives at [facility].  I spoke with Dr. Redmond Baseman who said there is no intervention he needs to do now and for the hospitalists to call when patient gets to [place] if they want a consult.  Pt d/w Dr. Dyann Kief (triad) for admission.  CRITICAL CARE Performed by: Isla Pence   Total critical care time: 30 minutes  Critical care time was exclusive of separately billable procedures and treating other patients.  Critical care was necessary to treat  or prevent imminent or life-threatening deterioration.  Critical care was time spent personally by me on the following activities: development of treatment plan with patient and/or surrogate as well as nursing, discussions with consultants, evaluation of patient's response to treatment, examination of patient, obtaining history from patient or surrogate, ordering and performing treatments and interventions, ordering and review of laboratory studies, ordering and review of radiographic studies, pulse oximetry and re-evaluation of patient's condition.  Final Clinical Impression(s) / ED Diagnoses Final diagnoses:  Facial cellulitis  Parotiditis    Rx / DC Orders ED Discharge Orders    None        Isla Pence, MD 01/28/21 1234

## 2021-01-28 NOTE — ED Triage Notes (Signed)
Pt brought in by [facility] from home with c/o right facial swelling that started yesterday but worsened today. EMS reports pt has inspiratory wheezing. Denies SOB. EMS attempted IV but unable to obtain. Unknown allergies. Pt discharged from hospital for 4 months with Covid.

## 2021-01-28 NOTE — Telephone Encounter (Signed)
SW received updates from Angela/[facility] liaison [phone]) confirming branch accepted pt for HHPT/OT.   SW called pt to inform on above. Pt informed SW he was currently in the ED waiting on a bed due to facial swelling. Pt states the [facility] branch called him and was informed they would continue to follow him until his discharge. No further SW intervention.

## 2021-01-28 NOTE — ED Notes (Signed)
[facility] to bedside for pt transport.

## 2021-01-28 NOTE — H&P (Signed)
History and Physical    Jackson Figueroa LOV:[id number] DOB: 16-Mar-1961 DOA: 01/28/2021  PCP: Patient, No Pcp Per   Patient coming from: home  I have personally briefly reviewed patient's old medical records in [place]  Chief Complaint: Right-sided facial/neck swelling and pain  HPI: Jackson Figueroa is a 60 y.o. male with medical history significant of class I obesity, extensive hospitalization since November 28/2021 to January 26/22 due to respiratory failure with hypoxia, ARDS and renal failure in the setting of Covid infection.  Patient will discharge to inpatient rehab where he completed rehabilitation and conditioning and went home on 01/24/2021.  Patient reports doing okay and feeling in his normal health status until approximately 24-40 hours when he noticed right-sided facial and neck swelling.  This has progressed it to cause discomfort and difficulty swallowing.  Patient denies any fever, but reported chills. No sour taste or inside the mouth discharge is reported. Patient expressed no chest pain, no shortness of breath, no wheezing, no palpitations, no dysuria, no abdominal pain, no melena, no hematochezia, no hemoptysis, no focal weakness or any other complaints.  Of note, during his last admission he ended requiring tracheostomy, which has healed since then.   ED Course: CT scan demonstrated advanced and widespread inflammatory changes of the right side of the face and neck.  There is appearance of oropharynx and hypopharynx in somewhat concerning for inflammatory changes and mass-effect.  There is some narrowing into the airways around oropharyngeal and hypopharyngeal region.  Patient received ceftriaxone, clindamycin and Decadron.  ENT has been consulted and recommended transfer to [facility] for further evaluation and management. (Dr. Redmond Figueroa)  Review of Systems: As per HPI otherwise all other systems reviewed and are negative.   Past Medical History:  Diagnosis Date   . Allergic   . Knee pain, bilateral    due to old injuires  . Numbness and tingling in right hand    and elbow due to sports injury  . Obesity   . Staph infection    of appy wound--treated with antibiotics X 4 weeks.   . Tendinitis of elbow    right    Past Surgical History:  Procedure Laterality Date  . APPENDECTOMY  1982  . INGUINAL HERNIA REPAIR Left 2000  . IR GASTROSTOMY TUBE MOD SED  11/21/2020  . IR GASTROSTOMY TUBE REMOVAL  01/17/2021  . KNEE ARTHROSCOPY     bilateral knees  . TONSILLECTOMY AND ADENOIDECTOMY    . TRACHEOSTOMY TUBE PLACEMENT N/A 11/14/2020   Procedure: TRACHEOSTOMY;  Surgeon: Jackson Quitter, MD;  Location: [facility];  Service: ENT;  Laterality: N/A;    Social History  reports that he quit smoking about 8 years ago. His smoking use included cigarettes. He smoked 2.00 packs per day. He has never used smokeless tobacco. He reports current alcohol use of about 2.0 standard drinks of alcohol per week. He reports that he does not use drugs.  No Known Allergies  Family History  Problem Relation Age of Onset  . Cerebral aneurysm Mother 53  . Heart disease Father   . Migraines Sister      Prior to Admission medications   Medication Sig Start Date End Date Taking? Authorizing Provider  acetaminophen (TYLENOL) 325 MG tablet Take 1-2 tablets (325-650 mg total) by mouth every 4 (four) hours as needed for mild pain. 01/22/21   Jackson Figueroa, Jackson Anchors, PA-C  diclofenac Sodium (VOLTAREN) 1 % GEL Apply 2 g topically 4 (four) times daily. 01/24/21  Jackson Figueroa, Jackson Anchors, PA-C  docusate sodium (COLACE) 50 MG capsule Take 1 capsule (50 mg total) by mouth 2 (two) times daily. 01/24/21   Jackson Figueroa, Jackson Anchors, PA-C  DULoxetine (CYMBALTA) 30 MG capsule Take 1 capsule (30 mg total) by mouth at bedtime. 01/24/21   Jackson Figueroa, Jackson Anchors, PA-C  famotidine (PEPCID) 20 MG tablet Take 1 tablet (20 mg total) by mouth 2 (two) times daily. 01/24/21   Jackson Figueroa, Jackson Anchors, PA-C  hydrocerin (EUCERIN) CREA Apply 1 application  topically daily. 01/24/21   Jackson Figueroa, Jackson Anchors, PA-C  levETIRAcetam (KEPPRA) 250 MG tablet Take 1 tablet (250 mg total) by mouth 2 (two) times daily. 01/24/21   Jackson Figueroa, Jackson Anchors, PA-C  lidocaine (XYLOCAINE) 5 % ointment Apply topically 4 (four) times daily as needed (for nerve pain in R hand). 01/24/21   Jackson Figueroa, Jackson Anchors, PA-C  lip balm (CARMEX) ointment Apply 1 application topically as needed for lip care (cold sores). 12/18/20   Jackson Noble Latif, DO  metaxalone (SKELAXIN) 800 MG tablet Take 1 tablet (800 mg total) by mouth 3 (three) times daily as needed for muscle spasms. 01/24/21   Jackson Figueroa, Jackson Anchors, PA-C  mirabegron ER (MYRBETRIQ) 25 MG TB24 tablet Take 1 tablet (25 mg total) by mouth at bedtime. 01/24/21   Jackson Figueroa, Jackson Anchors, PA-C  Multiple Vitamin (MULTIVITAMIN WITH MINERALS) TABS tablet Take 1 tablet by mouth daily.    [provider]  Nutritional Supplements (,FEEDING SUPPLEMENT, PROSOURCE PLUS) liquid Take 30 mLs by mouth 2 (two) times daily between meals. 01/24/21   Jackson Figueroa, Jackson Anchors, PA-C  pregabalin (LYRICA) 75 MG capsule Take 1 capsule (75 mg total) by mouth 3 (three) times daily. 01/24/21   Jackson Figueroa, Jackson Anchors, PA-C  traMADol (ULTRAM) 50 MG tablet Take 1 tablet (50 mg total) by mouth every 6 (six) hours as needed for severe pain. 01/24/21   Jackson Figueroa, Jackson Anchors, PA-C  traZODone (DESYREL) 50 MG tablet Take 1 tablet (50 mg total) by mouth at bedtime. 01/24/21   Jackson Figueroa, Jackson Anchors, PA-C  zinc oxide (BALMEX) 11.3 % CREA cream Apply 1 application topically daily. 01/24/21   Jackson Leriche, PA-C    Physical Exam: Vitals:   01/28/21 0945 01/28/21 1000 01/28/21 1015 01/28/21 1045  BP:  (!) 130/91    Pulse: 82 77 75 80  Resp:      Temp:      TempSrc:      SpO2: 100% 94% 99% 99%  Weight:      Height:        Constitutional: No fever, no complaining of shortness of breath currently.  Expressing mild difficulty swallowing and significant discomfort on the right-sided aspect of his face and neck. Vitals:   01/28/21 0945  01/28/21 1000 01/28/21 1015 01/28/21 1045  BP:  (!) 130/91    Pulse: 82 77 75 80  Resp:      Temp:      TempSrc:      SpO2: 100% 94% 99% 99%  Weight:      Height:       Eyes: PERRL, lids and conjunctivae normal, no icterus, no nystagmus. ENMT: Mucous membranes are moist.  Right side of his face/mandibular area tender to palpation, swollen and indurated. Neck: normal, supple, no masses, no thyromegaly, unable to assess JVD with body habitus. Respiratory: clear to auscultation bilaterally, no wheezing, no crackles. Normal respiratory effort. No accessory muscle use.  Cardiovascular: Regular rate and rhythm, no murmurs / rubs / gallops. No extremity edema.  Abdomen: Obese, no tenderness, no masses palpated. No hepatosplenomegaly. Bowel sounds positive.  Musculoskeletal: no clubbing / cyanosis. No joint deformity upper and lower extremities. Good ROM, no contractures. Normal muscle tone.  Skin: no petechiae. Neurologic: CN 2-12 grossly intact. Sensation intact, DTR normal. Strength 5/5 in all 4.  Psychiatric: Normal judgment and insight. Alert and oriented x 3. Normal mood.   Labs on Admission: I have personally reviewed following labs and imaging studies  CBC: Recent Labs  Lab 01/28/21 0851  WBC 8.2  NEUTROABS 5.7  HGB 10.1*  HCT 30.3*  MCV 97.4  PLT 786    Basic Metabolic Panel: Recent Labs  Lab 01/28/21 0851  NA 137  K 4.0  CL 103  CO2 25  GLUCOSE 99  BUN 22*  CREATININE 1.32*  CALCIUM 9.4    GFR: Estimated Creatinine Clearance: 83.1 mL/min (A) (by C-G formula based on SCr of 1.32 mg/dL (H)).  Urine analysis:    Component Value Date/Time   COLORURINE YELLOW 11/02/2020 1054   APPEARANCEUR CLOUDY (A) 11/02/2020 1054   LABSPEC 1.012 11/02/2020 1054   PHURINE 5.0 11/02/2020 1054   GLUCOSEU NEGATIVE 11/02/2020 1054   HGBUR LARGE (A) 11/02/2020 1054   BILIRUBINUR NEGATIVE 11/02/2020 1054   [place] 11/02/2020 1054   PROTEINUR 30 (A) 11/02/2020 1054    NITRITE NEGATIVE 11/02/2020 1054   LEUKOCYTESUR TRACE (A) 11/02/2020 1054    Radiological Exams on Admission: CT Soft Tissue Neck W Contrast  Result Date: 01/28/2021 CLINICAL DATA:  Right-sided throat swelling. EXAM: CT NECK WITH CONTRAST TECHNIQUE: Multidetector CT imaging of the neck was performed using the standard protocol following the bolus administration of intravenous contrast. CONTRAST:  73mL OMNIPAQUE IOHEXOL 300 MG/ML  SOLN COMPARISON:  None. FINDINGS: Pharynx and larynx: Advanced and widespread inflammatory changes of the right face and neck. There is marked swelling and edema of the right lateral oro pharyngeal to hypopharyngeal region. Marked swelling and edema present affecting the other tissues of the right side of the face and upper neck with widespread subcutaneous edema, massive swelling and inflammatory change of the right parotid gland, mild swelling and inflammatory change of the right submandibular gland, swelling and inflammatory change of the right sternocleidomastoid muscle, and mild reactive nodal prominence without visible suppuration. Because of the trans spatial nature of this, the findings are highly likely to be due to infectious inflammation. Because of the advanced nature, it is very difficult to determine where the process originated. The appearance of the oropharynx and hypopharynx is somewhat concerning and while certainly it may just simply represent advanced inflammation, I cannot rule out the possibility of a mass in that region in follow-up would be suggested after treatment of the infection. Narrowing of the hypopharynx due to the swelling which, if it worsens, could result in airway compromise. Salivary glands: See above. Markedly inflamed right parotid gland and moderately inflamed right submandibular gland. There is some possibility there could be early inflammation of the left submandibular gland as well. Left parotid is normal. Thyroid: Normal Lymph nodes: As  noted above, there are reactive nodes on the right secondary to the right-sided inflammatory disease. Vascular: Mild atherosclerotic change at the carotid bifurcations. Limited intracranial: Normal Visualized orbits: Normal Mastoids and visualized paranasal sinuses: Clear paranasal sinuses. Fluid opacification of the mastoid air cells on the right without evidence of bone destruction. Skeleton: Ordinary cervical spondylosis. Upper chest: Scarring at the lung apices. Other: None IMPRESSION: 1. Advanced and widespread inflammatory changes of the right side of  the face and neck as outlined above. Because of the advanced nature of this, it is very difficult to determine where the process originated. The appearance of the oropharynx and hypopharynx is somewhat concerning and while certainly it may just simply represent advanced inflammation, I cannot rule out the possibility of a mass in that region in that region in follow-up would be suggested after treatment of the infection. There is some narrowing the airway in the oro pharyngeal to hypopharyngeal region which could be especially concerning if it worsens further. 2. Fluid opacification of the mastoid air cells on the right without evidence of bone destruction. Electronically Signed   By: Nelson Chimes M.D.   On: 01/28/2021 10:02   Assessment/Plan 1-right facial swelling/tenderness: In the setting of cellulitis/parotiditis -Case discussed with ENT who recommended transfer to [facility] for further evaluation and management -Full liquid diet has been ordered as patient is experiencing difficulty swallowing currently. -Continue treatment with ceftriaxone, clindamycin and Decadron. -ENT will see patient on arrival to [facility].  2-no seizure appreciated -Continue the use of Keppra.  3-gastroesophageal flux disease -Continue famotidine.  4-history of neuropathy -Continue Lyrica  5-depression -No suicidal ideation or hallucinations -Stable  mood -Continue Cymbalta and trazodone.  6-class I obesity -Body mass index is 34.3 kg/m. -Low calorie diet, portion control and increase physical activity discussed with patient.  7-chronic kidney disease stage II -Appears to be stable and at baseline -Continue continue medical hydration -Avoid nephrotoxic agents -Follow renal function trend.     DVT prophylaxis: Heparin Code Status:   Full code Family Communication:  No family at bedside. Disposition Plan:   Patient is from:  Home  Anticipated DC to:  Home  Anticipated DC date:  To be determined  Anticipated DC barriers: Stabilization of acute right facial cellulitis/parotiditis.   Consults called:  ENT (Dr. Redmond Figueroa consulted by EDP). Recommended to be notified once patient is at [facility] Admission status:  Med-surg, inpatient, LOS > 2 midnights.  Severity of Illness: Mild to moderate severity; right facial cellulitis/parotiditis with concerns for oropharyngeal and hypopharynx involvement. Started on IV antibiotics and decadron. Will follow rec's by ENT.  Barton Dubois MD [facility]  How to contact the [facility] Attending or Consulting provider [place] or covering provider during after hours [place], for this patient?   1. Check the care team in [facility] and look for a) attending/consulting [facility] provider listed and b) the [facility] team listed 2. Log into [email or web address] and use [place]'s universal password to access. If you do not have the password, please contact the hospital operator. 3. Locate the [facility] provider you are looking for under [facility] and page to a number that you can be directly reached. 4. If you still have difficulty reaching the provider, please page the [facility] (Director on Call) for the Hospitalists listed on amion for assistance.  01/28/2021, 12:16 PM

## 2021-01-28 NOTE — ED Notes (Signed)
Dr. Gilford Raid notified of pt's symptoms.

## 2021-01-29 LAB — BASIC METABOLIC PANEL
Anion gap: 10 (ref 5–15)
BUN: 20 mg/dL (ref 6–20)
CO2: 23 mmol/L (ref 22–32)
Calcium: 9.7 mg/dL (ref 8.9–10.3)
Chloride: 104 mmol/L (ref 98–111)
Creatinine, Ser: 1.31 mg/dL — ABNORMAL HIGH (ref 0.61–1.24)
GFR, Estimated: >60 mL/min (ref >60)
Glucose, Bld: 144 mg/dL — ABNORMAL HIGH (ref 70–99)
Potassium: 4.3 mmol/L (ref 3.5–5.1)
Sodium: 137 mmol/L (ref 135–145)

## 2021-01-29 LAB — CBC
HCT: 29.1 % — ABNORMAL LOW (ref 39.0–52.0)
Hemoglobin: 10.2 g/dL — ABNORMAL LOW (ref 13.0–17.0)
MCH: 32.5 pg (ref 26.0–34.0)
MCHC: 35.1 g/dL (ref 30.0–36.0)
MCV: 92.7 fL (ref 80.0–100.0)
Platelets: 219 10*3/uL (ref 150–400)
RBC: 3.14 MIL/uL — ABNORMAL LOW (ref 4.22–5.81)
RDW: 12.8 % (ref 11.5–15.5)
WBC: 7.6 10*3/uL (ref 4.0–10.5)
nRBC: 0 % (ref 0.0–0.2)

## 2021-01-29 MED ORDER — DEXAMETHASONE 6 MG PO TABS: 6.0000 mg | ORAL_TABLET | Freq: Every day | ORAL | 0 refills | Status: AC

## 2021-01-29 MED ORDER — AMOXICILLIN-POT CLAVULANATE 875-125 MG PO TABS: 1.0000 | ORAL_TABLET | Freq: Two times a day (BID) | ORAL | 0 refills | Status: AC

## 2021-01-29 NOTE — Discharge Summary (Signed)
Physician Discharge Summary  Jackson Figueroa VOH:[id number] DOB: [birth year] DOA: 01/28/2021  PCP: Patient, No Pcp Per  Admit date: 01/28/2021 Discharge date: 01/29/2021  Admitted From: home Disposition:  home  Recommendations for Outpatient Follow-up:  1. Follow up with PCP in 1-2 weeks 2. Please obtain BMP/CBC in one week 3. Please follow up with ENT if having recurrent or worsening neck or throat swelling or pain  Home Health: No  Equipment/Devices: None   Discharge Condition: Stable  CODE STATUS: Full  Diet recommendation: Regular as tolerated - soft foods or liquids if any issues with swallowing solid foods  Discharge Diagnoses: Active Problems:   Facial cellulitis Right parotiditis Right facial swelling and tenderness History of seizure disorder History of GERD History of peripheral neuropathy History of depression Class I obesity CKD stage II    Summary of HPI and Hospital Course:   Jackson Figueroa is a 60 y.o. male with medical history significant of class I obesity, extensive hospitalization since November 28/2021 to January 26/22 due to respiratory failure with hypoxia, ARDS and renal failure in the setting of Covid infection.  Patient will discharge to inpatient rehab where he completed rehabilitation and conditioning and went home on 01/24/2021.  Patient reports doing okay and feeling in his normal health status until approximately 24-40 hours when he noticed right-sided facial and neck swelling.  This has progressed it to cause discomfort and difficulty swallowing.  Patient denies any fever, but reported chills. No sour taste or inside the mouth discharge is reported. Patient expressed no chest pain, no shortness of breath, no wheezing, no palpitations, no dysuria, no abdominal pain, no melena, no hematochezia, no hemoptysis, no focal weakness or any other complaints.  Of note, during his last admission he ended requiring tracheostomy, which has healed since then.   ED  Course: CT scan demonstrated advanced and widespread inflammatory changes of the right side of the face and neck.  There is appearance of oropharynx and hypopharynx in somewhat concerning for inflammatory changes and mass-effect.  There is some narrowing into the airways around oropharyngeal and hypopharyngeal region.  Patient received ceftriaxone, clindamycin and Decadron.  ENT has been consulted and recommended transfer to [facility] for further evaluation and management. (Dr. Redmond Baseman)   Patient was seen and evaluated by ENT.  Clinical response to IV antibiotics and steroids has been excellent.  Swelling improved and patient tolerating swallow much better.  There was no indication for any surgical intervention.  Given significant clinical improvement, patient was deemed stable for discharge home to complete oral antibiotics.  Patient advised to return to the ED if any increased swelling or pain.  He was prescribed Augmentin to finish 14 day course of antibiotics.         Discharge Instructions   Discharge Instructions    Call MD for:   Complete by: As directed    Difficulty with swallowing or any breathing difficulty.   Call MD for:  extreme fatigue   Complete by: As directed    Call MD for:  persistant dizziness or light-headedness   Complete by: As directed    Call MD for:  persistant nausea and vomiting   Complete by: As directed    Call MD for:  severe uncontrolled pain   Complete by: As directed    Call MD for:  temperature >100.4   Complete by: As directed    Diet - low sodium heart healthy   Complete by: As directed    Discharge instructions  Complete by: As directed    Take Augmentin (antibiotic) twice daily until gone.  Total antibiotic course of 2 weeks, including what we gave by IV in the hospital.  I continued the steroid, by mouth, for another 4 days - this will continue to help the swelling improve along with antibiotic.  If you feel like swelling is  worsening and you have ANY trouble with swallowing or breathing, please return back to the emergency department.   Happy Birthday!   Increase activity slowly   Complete by: As directed      Allergies as of 01/29/2021   No Known Allergies     Medication List    TAKE these medications   acetaminophen 325 MG tablet Commonly known as: TYLENOL Take 1-2 tablets (325-650 mg total) by mouth every 4 (four) hours as needed for mild pain.   amoxicillin-clavulanate 875-125 MG tablet Commonly known as: Augmentin Take 1 tablet by mouth every 12 (twelve) hours for 13 days.   dexamethasone 6 MG tablet Commonly known as: Decadron Take 1 tablet (6 mg total) by mouth daily for 4 days.   diclofenac Sodium 1 % Gel Commonly known as: VOLTAREN Apply 2 g topically 4 (four) times daily.   DULoxetine 30 MG capsule Commonly known as: CYMBALTA Take 1 capsule (30 mg total) by mouth at bedtime.   famotidine 20 MG tablet Commonly known as: PEPCID Take 1 tablet (20 mg total) by mouth 2 (two) times daily.   hydrocerin Crea Apply 1 application topically daily.   levETIRAcetam 250 MG tablet Commonly known as: KEPPRA Take 1 tablet (250 mg total) by mouth 2 (two) times daily.   lidocaine 5 % ointment Commonly known as: XYLOCAINE Apply topically 4 (four) times daily as needed (for nerve pain in R hand).   mirabegron ER 25 MG Tb24 tablet Commonly known as: MYRBETRIQ Take 1 tablet (25 mg total) by mouth at bedtime.   multivitamin with minerals Tabs tablet Take 1 tablet by mouth daily.   pregabalin 75 MG capsule Commonly known as: LYRICA Take 1 capsule (75 mg total) by mouth 3 (three) times daily.   traZODone 50 MG tablet Commonly known as: DESYREL Take 1 tablet (50 mg total) by mouth at bedtime.   zinc oxide 11.3 % Crea cream Commonly known as: BALMEX Apply 1 application topically daily.       No Known Allergies   If you experience worsening of your admission symptoms, develop  shortness of breath, life threatening emergency, suicidal or homicidal thoughts you must seek medical attention immediately by calling 911 or calling your MD immediately  if symptoms less severe.    Please note   You were cared for by a hospitalist during your hospital stay. If you have any questions about your discharge medications or the care you received while you were in the hospital after you are discharged, you can call the unit and asked to speak with the hospitalist on call if the hospitalist that took care of you is not available. Once you are discharged, your primary care physician will handle any further medical issues. Please note that NO REFILLS for any discharge medications will be authorized once you are discharged, as it is imperative that you return to your primary care physician (or establish a relationship with a primary care physician if you do not have one) for your aftercare needs so that they can reassess your need for medications and monitor your lab values.   Consultations:  ENT  Procedures/Studies: CT Soft Tissue Neck W Contrast  Result Date: 01/28/2021 CLINICAL DATA:  Right-sided throat swelling. EXAM: CT NECK WITH CONTRAST TECHNIQUE: Multidetector CT imaging of the neck was performed using the standard protocol following the bolus administration of intravenous contrast. CONTRAST:  56mL OMNIPAQUE IOHEXOL 300 MG/ML  SOLN COMPARISON:  None. FINDINGS: Pharynx and larynx: Advanced and widespread inflammatory changes of the right face and neck. There is marked swelling and edema of the right lateral oro pharyngeal to hypopharyngeal region. Marked swelling and edema present affecting the other tissues of the right side of the face and upper neck with widespread subcutaneous edema, massive swelling and inflammatory change of the right parotid gland, mild swelling and inflammatory change of the right submandibular gland, swelling and inflammatory change of the right  sternocleidomastoid muscle, and mild reactive nodal prominence without visible suppuration. Because of the trans spatial nature of this, the findings are highly likely to be due to infectious inflammation. Because of the advanced nature, it is very difficult to determine where the process originated. The appearance of the oropharynx and hypopharynx is somewhat concerning and while certainly it may just simply represent advanced inflammation, I cannot rule out the possibility of a mass in that region in follow-up would be suggested after treatment of the infection. Narrowing of the hypopharynx due to the swelling which, if it worsens, could result in airway compromise. Salivary glands: See above. Markedly inflamed right parotid gland and moderately inflamed right submandibular gland. There is some possibility there could be early inflammation of the left submandibular gland as well. Left parotid is normal. Thyroid: Normal Lymph nodes: As noted above, there are reactive nodes on the right secondary to the right-sided inflammatory disease. Vascular: Mild atherosclerotic change at the carotid bifurcations. Limited intracranial: Normal Visualized orbits: Normal Mastoids and visualized paranasal sinuses: Clear paranasal sinuses. Fluid opacification of the mastoid air cells on the right without evidence of bone destruction. Skeleton: Ordinary cervical spondylosis. Upper chest: Scarring at the lung apices. Other: None IMPRESSION: 1. Advanced and widespread inflammatory changes of the right side of the face and neck as outlined above. Because of the advanced nature of this, it is very difficult to determine where the process originated. The appearance of the oropharynx and hypopharynx is somewhat concerning and while certainly it may just simply represent advanced inflammation, I cannot rule out the possibility of a mass in that region in that region in follow-up would be suggested after treatment of the infection. There is  some narrowing the airway in the oro pharyngeal to hypopharyngeal region which could be especially concerning if it worsens further. 2. Fluid opacification of the mastoid air cells on the right without evidence of bone destruction. Electronically Signed   By: Nelson Chimes M.D.   On: 01/28/2021 10:02   MR KNEE LEFT WO CONTRAST  Result Date: 01/21/2021 CLINICAL DATA:  Pain EXAM: MRI OF THE LEFT KNEE WITHOUT CONTRAST TECHNIQUE: Multiplanar, multisequence MR imaging of the knee was performed. No intravenous contrast was administered. COMPARISON:  None. FINDINGS: MENISCI Medial: There is a complex tear of the posterior horn of the medial meniscus which extends to the mid body. A of the midbody is displaced into the medial gutter. The tear also extends through the anterior horn of the medial meniscus. Lateral: Intact. LIGAMENTS Cruciates: Increased intrasubstance signal seen within the ACL, however it is intact. The PCL is intact. Collaterals: Increased signal seen within the MCL with probable tiny focal perforation the meniscofemoral deep ligament. The lateral collateral  ligamentous complex is intact. CARTILAGE Patellofemoral: There is chondral fissuring seen within the central patellar apex and the lateral patellar facet. Medial compartment: Diffuse chondral thinning with near complete cartilage loss seen weight-bearing surface of the medial femoral condyle medial tibial plateau with marginal osteophyte formation. Lateral compartment: Mild chondral fissuring seen the weight-bearing surface of the lateral femoral condyle. BONES: No fracture. No avascular necrosis. No pathologic marrow infiltration. JOINT: A large knee joint effusion is present with scattered debris. Edema seen within Hoffa's fat pad. No plical thickening. EXTENSOR MECHANISM: The patellar and quadriceps tendon are intact. The retinaculum is unremarkable. POPLITEAL FOSSA: There is a large 4 cm loculated popliteal cyst with evidence of recent partial  rupture. OTHER: There is mild edema seen within the popliteus muscle belly. Increased signal seen at the insertion site of the medial head of the gastrocnemius. There is edema seen at the posterior popliteal fossa of the knee. There is also mildly increased signal seen vastus medialis. Increased signal with mild fatty atrophy seen within muscles of the lower extremity below the knee. IMPRESSION: 1. Complex tear of the medial meniscus with a small extruded portion displaced within the gutter. 2. Intrasubstance sprain of the ACL, however it is intact 3. Grade 1-2 medial collateral ligamentous injury with surrounding edema 4. 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 Moderate to large knee joint effusion with synovitis 6. Large loculated popliteal cyst with evidence of recent partial rupture 7. Insertional medial head gastrocnemius tendinosis Electronically Signed   By: Prudencio Pair M.D.   On: 01/21/2021 17:10   DG Knee Complete 4 Views Left  Result Date: 01/22/2021 CLINICAL DATA:  Effusion.  Recent fall. EXAM: LEFT KNEE - COMPLETE 4+ VIEW COMPARISON:  MRI 01/21/2021. FINDINGS: Tricompartment degenerative change. Degenerative changes most prominent about the medial compartment. No evidence of fracture or dislocation. Prominent knee joint effusion. Small calcific densities noted over popliteal space. These could represent loose bodies and/or a process such as synovial osteochondromas within previously identified large popliteal cyst. IMPRESSION: 1. Tricompartment degenerative change. Degenerative changes most prominent about the medial compartment. No acute bony abnormality identified. 2. Prominent knee joint effusion. 3. Small calcific densities noted over the popliteal space. These could represent loose bodies and or process such as synovial osteochondromas within the previously identified large popliteal cyst. Electronically Signed   By: [place]   On:  01/22/2021 12:58   IR GASTROSTOMY TUBE REMOVAL/REPAIR  Result Date: 01/17/2021 INDICATION: History of hypoxic respiratory failure related to COVID pneumonia requiring trach and gastrostomy tube for long-term support. He has progressed with rehabilitation and is no longer in need of his gastrostomy tube. Request is made for removal. EXAM: BEDSIDE REMOVAL OF GASTROSTOMY TUBE COMPARISON:  None. MEDICATIONS: 10 mL viscous lidocaine CONTRAST:  None FLUOROSCOPY TIME:  None COMPLICATIONS: None immediate. PROCEDURE: A time-out was performed prior to the initiation of the procedure. The track of the existing gastrostomy tube was lubricated with viscous lidocaine. Using manual traction, the existing pull-through gastrostomy tube was removed intact. A dressing was placed. The patient tolerated the procedure well without immediate postprocedural complication. IMPRESSION: Successful bedside removal of pull-through gastrostomy tube without complication. Read by: Brynda Greathouse PA-C Electronically Signed   By: Markus Daft M.D.   On: 01/17/2021 10:36         Subjective: Pt doing well, today is his birthday and he really wants to go home.  ENT did come in to see him.  No surgery or any drainage will  be required.  The patient denies fevers or chills.  Says his swallowing has improved with treatment.  No difficulty breathing.   Discharge Exam: Vitals:   01/29/21 0544 01/29/21 0544  BP: 125/83 125/83  Pulse: 89 91  Resp: 19 19  Temp: 98.2 F (36.8 C) 98.2 F (36.8 C)  SpO2: 93% 94%   Vitals:   01/28/21 2121 01/29/21 0208 01/29/21 0544 01/29/21 0544  BP: 123/73 113/79 125/83 125/83  Pulse: 86 95 89 91  Resp: 20 19 19 19   Temp: 98.2 F (36.8 C) 97.8 F (36.6 C) 98.2 F (36.8 C) 98.2 F (36.8 C)  TempSrc:      SpO2: 97% 91% 93% 94%  Weight:      Height:        General: Pt is alert, awake, not in acute distress Cardiovascular: RRR, S1/S2 +, no rubs, no gallops Respiratory: CTA bilaterally, no  wheezing, no rhonchi HEENT: Right-sided submandibular swelling without tenderness, moist mucous membranes, clear conjunctiva Abdominal: Soft, NT, ND, bowel sounds + Extremities: no edema, no cyanosis    The results of significant diagnostics from this hospitalization (including imaging, microbiology, ancillary and laboratory) are listed below for reference.     Microbiology: Recent Results (from the past 240 hour(s))  Resp Panel by RT-PCR (Flu A&B, Covid) Nasopharyngeal Swab     Status: None   Collection Time: 01/28/21 11:16 AM   Specimen: Nasopharyngeal Swab; Nasopharyngeal(NP) swabs in vial transport medium  Result Value Ref Range Status   SARS Coronavirus 2 by RT PCR NEGATIVE NEGATIVE Final    Comment: (NOTE) SARS-CoV-2 target nucleic acids are NOT DETECTED.  The SARS-CoV-2 RNA is generally detectable in upper respiratory specimens during the acute phase of infection. The lowest concentration of SARS-CoV-2 viral copies this assay can detect is 138 copies/mL. A negative result does not preclude SARS-Cov-2 infection and should not be used as the sole basis for treatment or other patient management decisions. A negative result may occur with  improper specimen collection/handling, submission of specimen other than nasopharyngeal swab, presence of viral mutation(s) within the areas targeted by this assay, and inadequate number of viral copies(<138 copies/mL). A negative result must be combined with clinical observations, patient history, and epidemiological information. The expected result is Negative.  Fact Sheet for Patients:  EntrepreneurPulse.com.au  Fact Sheet for Healthcare Providers:  IncredibleEmployment.be  This test is no t yet approved or cleared by the [place] FDA and  has been authorized for detection and/or diagnosis of SARS-CoV-2 by FDA under an Emergency Use Authorization (EUA). This EUA will remain  in effect (meaning  this test can be used) for the duration of the COVID-19 declaration under Section 564(b)(1) of the Act, 21 U.S.C.section 360bbb-3(b)(1), unless the authorization is terminated  or revoked sooner.       Influenza A by PCR NEGATIVE NEGATIVE Final   Influenza B by PCR NEGATIVE NEGATIVE Final    Comment: (NOTE) The Xpert Xpress SARS-CoV-2/FLU/RSV plus assay is intended as an aid in the diagnosis of influenza from Nasopharyngeal swab specimens and should not be used as a sole basis for treatment. Nasal washings and aspirates are unacceptable for Xpert Xpress SARS-CoV-2/FLU/RSV testing.  Fact Sheet for Patients: EntrepreneurPulse.com.au  Fact Sheet for Healthcare Providers: IncredibleEmployment.be  This test is not yet approved or cleared by the [place] FDA and has been authorized for detection and/or diagnosis of SARS-CoV-2 by FDA under an Emergency Use Authorization (EUA). This EUA will remain in effect (meaning this test can  be used) for the duration of the COVID-19 declaration under Section 564(b)(1) of the Act, 21 U.S.C. section 360bbb-3(b)(1), unless the authorization is terminated or revoked.  Performed at [facility], [address] [place], [place] [address]      Labs: BNP (last 3 results) Recent Labs    10/20/20 2043  BNP 25.4   Basic Metabolic Panel: Recent Labs  Lab 01/28/21 0851 01/29/21 0215  NA 137 137  K 4.0 4.3  CL 103 104  CO2 25 23  GLUCOSE 99 144*  BUN 22* 20  CREATININE 1.32* 1.31*  CALCIUM 9.4 9.7  MG 1.8  --   PHOS 5.3*  --    Liver Function Tests: No results for input(s): AST, ALT, ALKPHOS, BILITOT, PROT, ALBUMIN in the last 168 hours. No results for input(s): LIPASE, AMYLASE in the last 168 hours. No results for input(s): AMMONIA in the last 168 hours. CBC: Recent Labs  Lab 01/28/21 0851 01/29/21 0215  WBC 8.2 7.6  NEUTROABS 5.7  --   HGB 10.1* 10.2*  HCT 30.3* 29.1*  MCV 97.4 92.7  PLT  212 219   Cardiac Enzymes: No results for input(s): CKTOTAL, CKMB, CKMBINDEX, TROPONINI in the last 168 hours. BNP: Invalid input(s): POCBNP CBG: No results for input(s): GLUCAP in the last 168 hours. D-Dimer No results for input(s): DDIMER in the last 72 hours. Hgb A1c No results for input(s): HGBA1C in the last 72 hours. Lipid Profile No results for input(s): CHOL, HDL, LDLCALC, TRIG, CHOLHDL, LDLDIRECT in the last 72 hours. Thyroid function studies No results for input(s): TSH, T4TOTAL, T3FREE, THYROIDAB in the last 72 hours.  Invalid input(s): FREET3 Anemia work up No results for input(s): VITAMINB12, FOLATE, FERRITIN, TIBC, IRON, RETICCTPCT in the last 72 hours. Urinalysis    Component Value Date/Time   COLORURINE YELLOW 11/02/2020 1054   APPEARANCEUR CLOUDY (A) 11/02/2020 1054   LABSPEC 1.012 11/02/2020 1054   PHURINE 5.0 11/02/2020 1054   GLUCOSEU NEGATIVE 11/02/2020 1054   HGBUR LARGE (A) 11/02/2020 1054   BILIRUBINUR NEGATIVE 11/02/2020 1054   [place] 11/02/2020 1054   PROTEINUR 30 (A) 11/02/2020 1054   NITRITE NEGATIVE 11/02/2020 1054   LEUKOCYTESUR TRACE (A) 11/02/2020 1054   Sepsis Labs Invalid input(s): PROCALCITONIN,  WBC,  LACTICIDVEN Microbiology Recent Results (from the past 240 hour(s))  Resp Panel by RT-PCR (Flu A&B, Covid) Nasopharyngeal Swab     Status: None   Collection Time: 01/28/21 11:16 AM   Specimen: Nasopharyngeal Swab; Nasopharyngeal(NP) swabs in vial transport medium  Result Value Ref Range Status   SARS Coronavirus 2 by RT PCR NEGATIVE NEGATIVE Final    Comment: (NOTE) SARS-CoV-2 target nucleic acids are NOT DETECTED.  The SARS-CoV-2 RNA is generally detectable in upper respiratory specimens during the acute phase of infection. The lowest concentration of SARS-CoV-2 viral copies this assay can detect is 138 copies/mL. A negative result does not preclude SARS-Cov-2 infection and should not be used as the sole basis for  treatment or other patient management decisions. A negative result may occur with  improper specimen collection/handling, submission of specimen other than nasopharyngeal swab, presence of viral mutation(s) within the areas targeted by this assay, and inadequate number of viral copies(<138 copies/mL). A negative result must be combined with clinical observations, patient history, and epidemiological information. The expected result is Negative.  Fact Sheet for Patients:  EntrepreneurPulse.com.au  Fact Sheet for Healthcare Providers:  IncredibleEmployment.be  This test is no t yet approved or cleared by the [place]  FDA and  has been authorized for detection and/or diagnosis of SARS-CoV-2 by FDA under an Emergency Use Authorization (EUA). This EUA will remain  in effect (meaning this test can be used) for the duration of the COVID-19 declaration under Section 564(b)(1) of the Act, 21 U.S.C.section 360bbb-3(b)(1), unless the authorization is terminated  or revoked sooner.       Influenza A by PCR NEGATIVE NEGATIVE Final   Influenza B by PCR NEGATIVE NEGATIVE Final    Comment: (NOTE) The Xpert Xpress SARS-CoV-2/FLU/RSV plus assay is intended as an aid in the diagnosis of influenza from Nasopharyngeal swab specimens and should not be used as a sole basis for treatment. Nasal washings and aspirates are unacceptable for Xpert Xpress SARS-CoV-2/FLU/RSV testing.  Fact Sheet for Patients: EntrepreneurPulse.com.au  Fact Sheet for Healthcare Providers: IncredibleEmployment.be  This test is not yet approved or cleared by the [place] FDA and has been authorized for detection and/or diagnosis of SARS-CoV-2 by FDA under an Emergency Use Authorization (EUA). This EUA will remain in effect (meaning this test can be used) for the duration of the COVID-19 declaration under Section 564(b)(1) of the Act, 21  U.S.C. section 360bbb-3(b)(1), unless the authorization is terminated or revoked.  Performed at [facility], [address], [place], [place] [address]      Time coordinating discharge: Over 30 minutes  SIGNED:   Ezekiel Slocumb, DO [facility] 01/29/2021, 1:48 PM   If 7PM-7AM, please contact night-coverage [email or web address]

## 2021-01-29 NOTE — Consult Note (Signed)
Reason for Consult: Face cellulitis Referring Physician: Hospitalist  Jackson Figueroa is an 60 y.o. male.  HPI: 60 year old male with history of prolonged hospitalization due to [place] late last year that included need for tracheostomy.  The tracheostomy was later decannulated and the site healed well.  He presented to the ER yesterday with onset that morning of face and neck swelling and pain.  He had difficulty swallowing due to throat swelling and pain.  He did not have fever.  A CT scan was done at the ER and he was transferred to [facility] for inpatient management.  This morning, he reports noticeable improvement in his face/neck swelling and his swallow feels much better.  He has not had any airway symptoms.  Past Medical History:  Diagnosis Date   Allergic    Knee pain, bilateral    due to old injuires   Numbness and tingling in right hand    and elbow due to sports injury   Obesity    Staph infection    of appy wound--treated with antibiotics X 4 weeks.    Tendinitis of elbow    right    Past Surgical History:  Procedure Laterality Date   APPENDECTOMY  1982   INGUINAL HERNIA REPAIR Left 2000   IR GASTROSTOMY TUBE MOD SED  11/21/2020   IR GASTROSTOMY TUBE REMOVAL  01/17/2021   KNEE ARTHROSCOPY     bilateral knees   TONSILLECTOMY AND ADENOIDECTOMY     TRACHEOSTOMY TUBE PLACEMENT N/A 11/14/2020   Procedure: TRACHEOSTOMY;  Surgeon: Melida Quitter, MD;  Location: [facility];  Service: ENT;  Laterality: N/A;    Family History  Problem Relation Age of Onset   Cerebral aneurysm Mother 60   Heart disease Father    Migraines Sister     Social History:  reports that he quit smoking about 8 years ago. His smoking use included cigarettes. He smoked 2.00 packs per day. He has never used smokeless tobacco. He reports current alcohol use of about 2.0 standard drinks of alcohol per week. He reports that he does not use drugs.  Allergies: No Known Allergies  Medications: I have  reviewed the patient's current medications.  Results for orders placed or performed during the hospital encounter of 01/28/21 (from the past 48 hour(s))  Basic metabolic panel     Status: Abnormal   Collection Time: 01/28/21  8:51 AM  Result Value Ref Range   Sodium 137 135 - 145 mmol/L   Potassium 4.0 3.5 - 5.1 mmol/L   Chloride 103 98 - 111 mmol/L   CO2 25 22 - 32 mmol/L   Glucose, Bld 99 70 - 99 mg/dL    Comment: Glucose reference range applies only to samples taken after fasting for at least 8 hours.   BUN 22 (H) 6 - 20 mg/dL   Creatinine, Ser 1.32 (H) 0.61 - 1.24 mg/dL   Calcium 9.4 8.9 - 10.3 mg/dL   GFR, Estimated >60 >60 mL/min    Comment: (NOTE) Calculated using the CKD-EPI Creatinine Equation (2021)    Anion gap 9 5 - 15    Comment: Performed at [facility], [address] [place], [place] [address]  CBC with Differential     Status: Abnormal   Collection Time: 01/28/21  8:51 AM  Result Value Ref Range   WBC 8.2 4.0 - 10.5 K/uL   RBC 3.11 (L) 4.22 - 5.81 MIL/uL   Hemoglobin 10.1 (L) 13.0 - 17.0 g/dL   HCT 30.3 (L)  39.0 - 52.0 %   MCV 97.4 80.0 - 100.0 fL   MCH 32.5 26.0 - 34.0 pg   MCHC 33.3 30.0 - 36.0 g/dL   RDW 13.1 11.5 - 15.5 %   Platelets 212 150 - 400 K/uL   nRBC 0.0 0.0 - 0.2 %   Neutrophils Relative % 70 %   Neutro Abs 5.7 1.7 - 7.7 K/uL   Lymphocytes Relative 14 %   Lymphs Abs 1.1 0.7 - 4.0 K/uL   Monocytes Relative 12 %   Monocytes Absolute 1.0 0.1 - 1.0 K/uL   Eosinophils Relative 4 %   Eosinophils Absolute 0.3 0.0 - 0.5 K/uL   Basophils Relative 0 %   Basophils Absolute 0.0 0.0 - 0.1 K/uL   Immature Granulocytes 0 %   Abs Immature Granulocytes 0.02 0.00 - 0.07 K/uL    Comment: Performed at [facility], [address], [place], [place] [address]  Magnesium     Status: None   Collection Time: 01/28/21  8:51 AM  Result Value Ref Range   Magnesium 1.8 1.7 - 2.4 mg/dL    Comment: Performed at [facility], [address] [place], [place] [address]   Phosphorus     Status: Abnormal   Collection Time: 01/28/21  8:51 AM  Result Value Ref Range   Phosphorus 5.3 (H) 2.5 - 4.6 mg/dL    Comment: Performed at [facility], [address] [place], [place] [address]  Resp Panel by RT-PCR (Flu A&B, Covid) Nasopharyngeal Swab     Status: None   Collection Time: 01/28/21 11:16 AM   Specimen: Nasopharyngeal Swab; Nasopharyngeal(NP) swabs in vial transport medium  Result Value Ref Range   SARS Coronavirus 2 by RT PCR NEGATIVE NEGATIVE    Comment: (NOTE) SARS-CoV-2 target nucleic acids are NOT DETECTED.  The SARS-CoV-2 RNA is generally detectable in upper respiratory specimens during the acute phase of infection. The lowest concentration of SARS-CoV-2 viral copies this assay can detect is 138 copies/mL. A negative result does not preclude SARS-Cov-2 infection and should not be used as the sole basis for treatment or other patient management decisions. A negative result may occur with  improper specimen collection/handling, submission of specimen other than nasopharyngeal swab, presence of viral mutation(s) within the areas targeted by this assay, and inadequate number of viral copies(<138 copies/mL). A negative result must be combined with clinical observations, patient history, and epidemiological information. The expected result is Negative.  Fact Sheet for Patients:  EntrepreneurPulse.com.au  Fact Sheet for Healthcare Providers:  IncredibleEmployment.be  This test is no t yet approved or cleared by the [place] FDA and  has been authorized for detection and/or diagnosis of SARS-CoV-2 by FDA under an Emergency Use Authorization (EUA). This EUA will remain  in effect (meaning this test can be used) for the duration of the COVID-19 declaration under Section 564(b)(1) of the Act, 21 U.S.C.section 360bbb-3(b)(1), unless the authorization is terminated  or revoked sooner.       Influenza A by PCR  NEGATIVE NEGATIVE   Influenza B by PCR NEGATIVE NEGATIVE    Comment: (NOTE) The Xpert Xpress SARS-CoV-2/FLU/RSV plus assay is intended as an aid in the diagnosis of influenza from Nasopharyngeal swab specimens and should not be used as a sole basis for treatment. Nasal washings and aspirates are unacceptable for Xpert Xpress SARS-CoV-2/FLU/RSV testing.  Fact Sheet for Patients: EntrepreneurPulse.com.au  Fact Sheet for Healthcare Providers: IncredibleEmployment.be  This test is not yet approved or cleared by the [place]  FDA and has been authorized for detection and/or diagnosis of SARS-CoV-2 by FDA under an Emergency Use Authorization (EUA). This EUA will remain in effect (meaning this test can be used) for the duration of the COVID-19 declaration under Section 564(b)(1) of the Act, 21 U.S.C. section 360bbb-3(b)(1), unless the authorization is terminated or revoked.  Performed at [facility], [address] [place], [place] [address]   HIV Antibody (routine testing w rflx)     Status: None   Collection Time: 01/28/21 12:52 PM  Result Value Ref Range   HIV Screen 4th Generation wRfx Non Reactive Non Reactive    Comment: Performed at [facility], [place] [address] [place], [place] [address]  Basic metabolic panel     Status: Abnormal   Collection Time: 01/29/21  2:15 AM  Result Value Ref Range   Sodium 137 135 - 145 mmol/L   Potassium 4.3 3.5 - 5.1 mmol/L   Chloride 104 98 - 111 mmol/L   CO2 23 22 - 32 mmol/L   Glucose, Bld 144 (H) 70 - 99 mg/dL    Comment: Glucose reference range applies only to samples taken after fasting for at least 8 hours.   BUN 20 6 - 20 mg/dL   Creatinine, Ser 1.31 (H) 0.61 - 1.24 mg/dL   Calcium 9.7 8.9 - 10.3 mg/dL   GFR, Estimated >60 >60 mL/min    Comment: (NOTE) Calculated using the CKD-EPI Creatinine Equation (2021)    Anion gap 10 5 - 15    Comment: Performed at [place]  [address], [place], [place] [address]  CBC     Status: Abnormal   Collection Time: 01/29/21  2:15 AM  Result Value Ref Range   WBC 7.6 4.0 - 10.5 K/uL   RBC 3.14 (L) 4.22 - 5.81 MIL/uL   Hemoglobin 10.2 (L) 13.0 - 17.0 g/dL   HCT 29.1 (L) 39.0 - 52.0 %   MCV 92.7 80.0 - 100.0 fL   MCH 32.5 26.0 - 34.0 pg   MCHC 35.1 30.0 - 36.0 g/dL   RDW 12.8 11.5 - 15.5 %   Platelets 219 150 - 400 K/uL   nRBC 0.0 0.0 - 0.2 %    Comment: Performed at [facility], [place] [address], [place], [place] [id number]    CT Soft Tissue Neck W Contrast  Result Date: 01/28/2021 CLINICAL DATA:  Right-sided throat swelling. EXAM: CT NECK WITH CONTRAST TECHNIQUE: Multidetector CT imaging of the neck was performed using the standard protocol following the bolus administration of intravenous contrast. CONTRAST:  81mL OMNIPAQUE IOHEXOL 300 MG/ML  SOLN COMPARISON:  None. FINDINGS: Pharynx and larynx: Advanced and widespread inflammatory changes of the right face and neck. There is marked swelling and edema of the right lateral oro pharyngeal to hypopharyngeal region. Marked swelling and edema present affecting the other tissues of the right side of the face and upper neck with widespread subcutaneous edema, massive swelling and inflammatory change of the right parotid gland, mild swelling and inflammatory change of the right submandibular gland, swelling and inflammatory change of the right sternocleidomastoid muscle, and mild reactive nodal prominence without visible suppuration. Because of the trans spatial nature of this, the findings are highly likely to be due to infectious inflammation. Because of the advanced nature, it is very difficult to determine where the process originated. The appearance of the oropharynx and hypopharynx is somewhat concerning and while certainly it may just simply represent advanced inflammation, I cannot  rule out the possibility of a mass in that region in follow-up would be suggested after treatment of  the infection. Narrowing of the hypopharynx due to the swelling which, if it worsens, could result in airway compromise. Salivary glands: See above. Markedly inflamed right parotid gland and moderately inflamed right submandibular gland. There is some possibility there could be early inflammation of the left submandibular gland as well. Left parotid is normal. Thyroid: Normal Lymph nodes: As noted above, there are reactive nodes on the right secondary to the right-sided inflammatory disease. Vascular: Mild atherosclerotic change at the carotid bifurcations. Limited intracranial: Normal Visualized orbits: Normal Mastoids and visualized paranasal sinuses: Clear paranasal sinuses. Fluid opacification of the mastoid air cells on the right without evidence of bone destruction. Skeleton: Ordinary cervical spondylosis. Upper chest: Scarring at the lung apices. Other: None IMPRESSION: 1. Advanced and widespread inflammatory changes of the right side of the face and neck as outlined above. Because of the advanced nature of this, it is very difficult to determine where the process originated. The appearance of the oropharynx and hypopharynx is somewhat concerning and while certainly it may just simply represent advanced inflammation, I cannot rule out the possibility of a mass in that region in that region in follow-up would be suggested after treatment of the infection. There is some narrowing the airway in the oro pharyngeal to hypopharyngeal region which could be especially concerning if it worsens further. 2. Fluid opacification of the mastoid air cells on the right without evidence of bone destruction. Electronically Signed   By: Nelson Chimes M.D.   On: 01/28/2021 10:02    Review of Systems  HENT: Positive for sore throat.   All other systems reviewed and are negative.  Blood pressure 125/83, pulse 91, temperature 98.2 F (36.8 C), resp. rate 19, height 6\' 1"  (1.854 m), weight 117.9 kg, SpO2 94 %. Physical  Exam Constitutional:      Appearance: Normal appearance. He is obese.  HENT:     Head: Normocephalic and atraumatic.     Right Ear: External ear normal.     Left Ear: External ear normal.     Nose: Nose normal.     Mouth/Throat:     Mouth: Mucous membranes are moist.     Pharynx: Oropharynx is clear.  Eyes:     Extraocular Movements: Extraocular movements intact.     Conjunctiva/sclera: Conjunctivae normal.     Pupils: Pupils are equal, round, and reactive to light.  Neck:     Comments: No significant redness or obvious edema. Cardiovascular:     Rate and Rhythm: Normal rate.  Pulmonary:     Effort: Pulmonary effort is normal.  Skin:    General: Skin is warm and dry.  Neurological:     General: No focal deficit present.     Mental Status: He is alert and oriented to person, place, and time.  Psychiatric:        Mood and Affect: Mood normal.        Behavior: Behavior normal.        Thought Content: Thought content normal.        Judgment: Judgment normal.     Assessment/Plan: Right face/neck cellulitis  I personally reviewed his neck CT yesterday demonstrating marked right face and neck edema and soft tissue standing with medialization of the right pharyngeal wall.  Today, his physical findings do not demonstrate anything like what the CT showed yesterday and he is feeling much better.  There  is no need for surgical intervention.  He can be changed to oral antibiotics and discharged home when the primary team feels comfortable.  It is unclear from where this infection originated but it is responded well to antibiotic therapy.  Melida Quitter 01/29/2021, 11:11 AM

## 2021-01-29 NOTE — Progress Notes (Signed)
IV removed, discharge instructions given to patient, patient verbalizes understanding

## 2021-01-30 ENCOUNTER — Telehealth: Payer: Self-pay | Admitting: *Deleted

## 2021-01-30 DIAGNOSIS — N182 Chronic kidney disease, stage 2 (mild): Secondary | ICD-10-CM | POA: Diagnosis not present

## 2021-01-30 DIAGNOSIS — J1282 Pneumonia due to coronavirus disease 2019: Secondary | ICD-10-CM | POA: Diagnosis not present

## 2021-01-30 DIAGNOSIS — U071 COVID-19: Secondary | ICD-10-CM | POA: Diagnosis not present

## 2021-01-30 DIAGNOSIS — J9601 Acute respiratory failure with hypoxia: Secondary | ICD-10-CM | POA: Diagnosis not present

## 2021-01-30 DIAGNOSIS — J9602 Acute respiratory failure with hypercapnia: Secondary | ICD-10-CM | POA: Diagnosis not present

## 2021-01-30 DIAGNOSIS — N179 Acute kidney failure, unspecified: Secondary | ICD-10-CM | POA: Diagnosis not present

## 2021-01-30 NOTE — Telephone Encounter (Signed)
[facility] Figueroa PT called for approval of POC for 1wk1,2wk4,1wk2 to work on strength balance transfers gate and HEP.  Mr Jackson Figueroa has declined OT because of having a copay.  They will need an order to discharge from OT.

## 2021-01-30 NOTE — Telephone Encounter (Signed)
That's fine- please give them both- thank you, ML

## 2021-01-31 NOTE — Telephone Encounter (Signed)
Notified. 

## 2021-02-03 DIAGNOSIS — J1282 Pneumonia due to coronavirus disease 2019: Secondary | ICD-10-CM | POA: Diagnosis not present

## 2021-02-03 DIAGNOSIS — N182 Chronic kidney disease, stage 2 (mild): Secondary | ICD-10-CM | POA: Diagnosis not present

## 2021-02-03 DIAGNOSIS — J9601 Acute respiratory failure with hypoxia: Secondary | ICD-10-CM | POA: Diagnosis not present

## 2021-02-03 DIAGNOSIS — N179 Acute kidney failure, unspecified: Secondary | ICD-10-CM | POA: Diagnosis not present

## 2021-02-03 DIAGNOSIS — J9602 Acute respiratory failure with hypercapnia: Secondary | ICD-10-CM | POA: Diagnosis not present

## 2021-02-03 DIAGNOSIS — U071 COVID-19: Secondary | ICD-10-CM | POA: Diagnosis not present

## 2021-02-04 DIAGNOSIS — N182 Chronic kidney disease, stage 2 (mild): Secondary | ICD-10-CM | POA: Diagnosis not present

## 2021-02-04 DIAGNOSIS — J9601 Acute respiratory failure with hypoxia: Secondary | ICD-10-CM | POA: Diagnosis not present

## 2021-02-04 DIAGNOSIS — U071 COVID-19: Secondary | ICD-10-CM | POA: Diagnosis not present

## 2021-02-04 DIAGNOSIS — N179 Acute kidney failure, unspecified: Secondary | ICD-10-CM | POA: Diagnosis not present

## 2021-02-04 DIAGNOSIS — J1282 Pneumonia due to coronavirus disease 2019: Secondary | ICD-10-CM | POA: Diagnosis not present

## 2021-02-04 DIAGNOSIS — J9602 Acute respiratory failure with hypercapnia: Secondary | ICD-10-CM | POA: Diagnosis not present

## 2021-02-06 DIAGNOSIS — J9601 Acute respiratory failure with hypoxia: Secondary | ICD-10-CM | POA: Diagnosis not present

## 2021-02-06 DIAGNOSIS — J1282 Pneumonia due to coronavirus disease 2019: Secondary | ICD-10-CM | POA: Diagnosis not present

## 2021-02-06 DIAGNOSIS — U071 COVID-19: Secondary | ICD-10-CM | POA: Diagnosis not present

## 2021-02-06 DIAGNOSIS — G6281 Critical illness polyneuropathy: Secondary | ICD-10-CM | POA: Diagnosis not present

## 2021-02-06 DIAGNOSIS — J9602 Acute respiratory failure with hypercapnia: Secondary | ICD-10-CM | POA: Diagnosis not present

## 2021-02-06 DIAGNOSIS — N182 Chronic kidney disease, stage 2 (mild): Secondary | ICD-10-CM | POA: Diagnosis not present

## 2021-02-06 DIAGNOSIS — N179 Acute kidney failure, unspecified: Secondary | ICD-10-CM | POA: Diagnosis not present

## 2021-02-07 DIAGNOSIS — N179 Acute kidney failure, unspecified: Secondary | ICD-10-CM | POA: Diagnosis not present

## 2021-02-07 DIAGNOSIS — N182 Chronic kidney disease, stage 2 (mild): Secondary | ICD-10-CM | POA: Diagnosis not present

## 2021-02-07 DIAGNOSIS — J9601 Acute respiratory failure with hypoxia: Secondary | ICD-10-CM | POA: Diagnosis not present

## 2021-02-07 DIAGNOSIS — J9602 Acute respiratory failure with hypercapnia: Secondary | ICD-10-CM | POA: Diagnosis not present

## 2021-02-07 DIAGNOSIS — U071 COVID-19: Secondary | ICD-10-CM | POA: Diagnosis not present

## 2021-02-07 DIAGNOSIS — J1282 Pneumonia due to coronavirus disease 2019: Secondary | ICD-10-CM | POA: Diagnosis not present

## 2021-02-10 DIAGNOSIS — J9601 Acute respiratory failure with hypoxia: Secondary | ICD-10-CM | POA: Diagnosis not present

## 2021-02-10 DIAGNOSIS — N179 Acute kidney failure, unspecified: Secondary | ICD-10-CM | POA: Diagnosis not present

## 2021-02-10 DIAGNOSIS — U071 COVID-19: Secondary | ICD-10-CM | POA: Diagnosis not present

## 2021-02-10 DIAGNOSIS — N182 Chronic kidney disease, stage 2 (mild): Secondary | ICD-10-CM | POA: Diagnosis not present

## 2021-02-10 DIAGNOSIS — J1282 Pneumonia due to coronavirus disease 2019: Secondary | ICD-10-CM | POA: Diagnosis not present

## 2021-02-10 DIAGNOSIS — J9602 Acute respiratory failure with hypercapnia: Secondary | ICD-10-CM | POA: Diagnosis not present

## 2021-02-11 DIAGNOSIS — J9602 Acute respiratory failure with hypercapnia: Secondary | ICD-10-CM | POA: Diagnosis not present

## 2021-02-11 DIAGNOSIS — J1282 Pneumonia due to coronavirus disease 2019: Secondary | ICD-10-CM | POA: Diagnosis not present

## 2021-02-11 DIAGNOSIS — U071 COVID-19: Secondary | ICD-10-CM | POA: Diagnosis not present

## 2021-02-11 DIAGNOSIS — N179 Acute kidney failure, unspecified: Secondary | ICD-10-CM | POA: Diagnosis not present

## 2021-02-11 DIAGNOSIS — J9601 Acute respiratory failure with hypoxia: Secondary | ICD-10-CM | POA: Diagnosis not present

## 2021-02-11 DIAGNOSIS — N182 Chronic kidney disease, stage 2 (mild): Secondary | ICD-10-CM | POA: Diagnosis not present

## 2021-02-12 DIAGNOSIS — U071 COVID-19: Secondary | ICD-10-CM | POA: Diagnosis not present

## 2021-02-12 DIAGNOSIS — N179 Acute kidney failure, unspecified: Secondary | ICD-10-CM | POA: Diagnosis not present

## 2021-02-12 DIAGNOSIS — J9601 Acute respiratory failure with hypoxia: Secondary | ICD-10-CM | POA: Diagnosis not present

## 2021-02-12 DIAGNOSIS — J9602 Acute respiratory failure with hypercapnia: Secondary | ICD-10-CM | POA: Diagnosis not present

## 2021-02-12 DIAGNOSIS — J1282 Pneumonia due to coronavirus disease 2019: Secondary | ICD-10-CM | POA: Diagnosis not present

## 2021-02-12 DIAGNOSIS — N182 Chronic kidney disease, stage 2 (mild): Secondary | ICD-10-CM | POA: Diagnosis not present

## 2021-02-13 DIAGNOSIS — J9602 Acute respiratory failure with hypercapnia: Secondary | ICD-10-CM | POA: Diagnosis not present

## 2021-02-13 DIAGNOSIS — N182 Chronic kidney disease, stage 2 (mild): Secondary | ICD-10-CM | POA: Diagnosis not present

## 2021-02-13 DIAGNOSIS — J9601 Acute respiratory failure with hypoxia: Secondary | ICD-10-CM | POA: Diagnosis not present

## 2021-02-13 DIAGNOSIS — U071 COVID-19: Secondary | ICD-10-CM | POA: Diagnosis not present

## 2021-02-13 DIAGNOSIS — N179 Acute kidney failure, unspecified: Secondary | ICD-10-CM | POA: Diagnosis not present

## 2021-02-13 DIAGNOSIS — J1282 Pneumonia due to coronavirus disease 2019: Secondary | ICD-10-CM | POA: Diagnosis not present

## 2021-02-17 ENCOUNTER — Inpatient Hospital Stay: Payer: BC Managed Care – PPO | Admitting: Nurse Practitioner

## 2021-02-17 DIAGNOSIS — J9601 Acute respiratory failure with hypoxia: Secondary | ICD-10-CM | POA: Diagnosis not present

## 2021-02-17 DIAGNOSIS — J1282 Pneumonia due to coronavirus disease 2019: Secondary | ICD-10-CM | POA: Diagnosis not present

## 2021-02-17 DIAGNOSIS — N179 Acute kidney failure, unspecified: Secondary | ICD-10-CM | POA: Diagnosis not present

## 2021-02-17 DIAGNOSIS — J9602 Acute respiratory failure with hypercapnia: Secondary | ICD-10-CM | POA: Diagnosis not present

## 2021-02-17 DIAGNOSIS — U071 COVID-19: Secondary | ICD-10-CM | POA: Diagnosis not present

## 2021-02-17 DIAGNOSIS — N182 Chronic kidney disease, stage 2 (mild): Secondary | ICD-10-CM | POA: Diagnosis not present

## 2021-02-18 DIAGNOSIS — J9602 Acute respiratory failure with hypercapnia: Secondary | ICD-10-CM | POA: Diagnosis not present

## 2021-02-18 DIAGNOSIS — N182 Chronic kidney disease, stage 2 (mild): Secondary | ICD-10-CM | POA: Diagnosis not present

## 2021-02-18 DIAGNOSIS — U071 COVID-19: Secondary | ICD-10-CM | POA: Diagnosis not present

## 2021-02-18 DIAGNOSIS — J9601 Acute respiratory failure with hypoxia: Secondary | ICD-10-CM | POA: Diagnosis not present

## 2021-02-18 DIAGNOSIS — J1282 Pneumonia due to coronavirus disease 2019: Secondary | ICD-10-CM | POA: Diagnosis not present

## 2021-02-18 DIAGNOSIS — N179 Acute kidney failure, unspecified: Secondary | ICD-10-CM | POA: Diagnosis not present

## 2021-02-20 DIAGNOSIS — J9601 Acute respiratory failure with hypoxia: Secondary | ICD-10-CM | POA: Diagnosis not present

## 2021-02-20 DIAGNOSIS — N179 Acute kidney failure, unspecified: Secondary | ICD-10-CM | POA: Diagnosis not present

## 2021-02-20 DIAGNOSIS — J9602 Acute respiratory failure with hypercapnia: Secondary | ICD-10-CM | POA: Diagnosis not present

## 2021-02-20 DIAGNOSIS — N182 Chronic kidney disease, stage 2 (mild): Secondary | ICD-10-CM | POA: Diagnosis not present

## 2021-02-20 DIAGNOSIS — U071 COVID-19: Secondary | ICD-10-CM | POA: Diagnosis not present

## 2021-02-20 DIAGNOSIS — J1282 Pneumonia due to coronavirus disease 2019: Secondary | ICD-10-CM | POA: Diagnosis not present

## 2021-02-24 DIAGNOSIS — J1282 Pneumonia due to coronavirus disease 2019: Secondary | ICD-10-CM | POA: Diagnosis not present

## 2021-02-24 DIAGNOSIS — J9602 Acute respiratory failure with hypercapnia: Secondary | ICD-10-CM | POA: Diagnosis not present

## 2021-02-24 DIAGNOSIS — N179 Acute kidney failure, unspecified: Secondary | ICD-10-CM | POA: Diagnosis not present

## 2021-02-24 DIAGNOSIS — N182 Chronic kidney disease, stage 2 (mild): Secondary | ICD-10-CM | POA: Diagnosis not present

## 2021-02-24 DIAGNOSIS — J9601 Acute respiratory failure with hypoxia: Secondary | ICD-10-CM | POA: Diagnosis not present

## 2021-02-24 DIAGNOSIS — U071 COVID-19: Secondary | ICD-10-CM | POA: Diagnosis not present

## 2021-02-25 DIAGNOSIS — U071 COVID-19: Secondary | ICD-10-CM | POA: Diagnosis not present

## 2021-02-25 DIAGNOSIS — J9601 Acute respiratory failure with hypoxia: Secondary | ICD-10-CM | POA: Diagnosis not present

## 2021-02-25 DIAGNOSIS — N182 Chronic kidney disease, stage 2 (mild): Secondary | ICD-10-CM | POA: Diagnosis not present

## 2021-02-25 DIAGNOSIS — J9602 Acute respiratory failure with hypercapnia: Secondary | ICD-10-CM | POA: Diagnosis not present

## 2021-02-25 DIAGNOSIS — J1282 Pneumonia due to coronavirus disease 2019: Secondary | ICD-10-CM | POA: Diagnosis not present

## 2021-02-25 DIAGNOSIS — N179 Acute kidney failure, unspecified: Secondary | ICD-10-CM | POA: Diagnosis not present

## 2021-02-26 DIAGNOSIS — U071 COVID-19: Secondary | ICD-10-CM | POA: Diagnosis not present

## 2021-02-26 DIAGNOSIS — J9602 Acute respiratory failure with hypercapnia: Secondary | ICD-10-CM | POA: Diagnosis not present

## 2021-02-26 DIAGNOSIS — J1282 Pneumonia due to coronavirus disease 2019: Secondary | ICD-10-CM | POA: Diagnosis not present

## 2021-02-26 DIAGNOSIS — N179 Acute kidney failure, unspecified: Secondary | ICD-10-CM | POA: Diagnosis not present

## 2021-02-26 DIAGNOSIS — N182 Chronic kidney disease, stage 2 (mild): Secondary | ICD-10-CM | POA: Diagnosis not present

## 2021-02-26 DIAGNOSIS — J9601 Acute respiratory failure with hypoxia: Secondary | ICD-10-CM | POA: Diagnosis not present

## 2021-02-27 DIAGNOSIS — N182 Chronic kidney disease, stage 2 (mild): Secondary | ICD-10-CM | POA: Diagnosis not present

## 2021-02-27 DIAGNOSIS — J1282 Pneumonia due to coronavirus disease 2019: Secondary | ICD-10-CM | POA: Diagnosis not present

## 2021-02-27 DIAGNOSIS — U071 COVID-19: Secondary | ICD-10-CM | POA: Diagnosis not present

## 2021-02-27 DIAGNOSIS — N179 Acute kidney failure, unspecified: Secondary | ICD-10-CM | POA: Diagnosis not present

## 2021-02-27 DIAGNOSIS — J9601 Acute respiratory failure with hypoxia: Secondary | ICD-10-CM | POA: Diagnosis not present

## 2021-02-27 DIAGNOSIS — J9602 Acute respiratory failure with hypercapnia: Secondary | ICD-10-CM | POA: Diagnosis not present

## 2021-03-03 DIAGNOSIS — U071 COVID-19: Secondary | ICD-10-CM | POA: Diagnosis not present

## 2021-03-03 DIAGNOSIS — J1282 Pneumonia due to coronavirus disease 2019: Secondary | ICD-10-CM | POA: Diagnosis not present

## 2021-03-03 DIAGNOSIS — J9601 Acute respiratory failure with hypoxia: Secondary | ICD-10-CM | POA: Diagnosis not present

## 2021-03-03 DIAGNOSIS — J9602 Acute respiratory failure with hypercapnia: Secondary | ICD-10-CM | POA: Diagnosis not present

## 2021-03-03 DIAGNOSIS — N182 Chronic kidney disease, stage 2 (mild): Secondary | ICD-10-CM | POA: Diagnosis not present

## 2021-03-03 DIAGNOSIS — N179 Acute kidney failure, unspecified: Secondary | ICD-10-CM | POA: Diagnosis not present

## 2021-03-04 DIAGNOSIS — J9601 Acute respiratory failure with hypoxia: Secondary | ICD-10-CM | POA: Diagnosis not present

## 2021-03-04 DIAGNOSIS — N179 Acute kidney failure, unspecified: Secondary | ICD-10-CM | POA: Diagnosis not present

## 2021-03-04 DIAGNOSIS — N182 Chronic kidney disease, stage 2 (mild): Secondary | ICD-10-CM | POA: Diagnosis not present

## 2021-03-04 DIAGNOSIS — J9602 Acute respiratory failure with hypercapnia: Secondary | ICD-10-CM | POA: Diagnosis not present

## 2021-03-04 DIAGNOSIS — U071 COVID-19: Secondary | ICD-10-CM | POA: Diagnosis not present

## 2021-03-04 DIAGNOSIS — J1282 Pneumonia due to coronavirus disease 2019: Secondary | ICD-10-CM | POA: Diagnosis not present

## 2021-03-05 ENCOUNTER — Encounter
Payer: BC Managed Care – PPO | Attending: Physical Medicine and Rehabilitation | Admitting: Physical Medicine and Rehabilitation

## 2021-03-05 ENCOUNTER — Other Ambulatory Visit: Payer: Self-pay

## 2021-03-05 ENCOUNTER — Encounter: Payer: Self-pay | Admitting: Physical Medicine and Rehabilitation

## 2021-03-05 VITALS — Temp 98.6°F | Ht 72.0 in | Wt 271.0 lb

## 2021-03-05 DIAGNOSIS — R269 Unspecified abnormalities of gait and mobility: Secondary | ICD-10-CM | POA: Diagnosis not present

## 2021-03-05 DIAGNOSIS — G5621 Lesion of ulnar nerve, right upper limb: Secondary | ICD-10-CM | POA: Insufficient documentation

## 2021-03-05 DIAGNOSIS — G7281 Critical illness myopathy: Secondary | ICD-10-CM | POA: Insufficient documentation

## 2021-03-05 DIAGNOSIS — M792 Neuralgia and neuritis, unspecified: Secondary | ICD-10-CM | POA: Insufficient documentation

## 2021-03-05 DIAGNOSIS — M25561 Pain in right knee: Secondary | ICD-10-CM | POA: Diagnosis not present

## 2021-03-05 DIAGNOSIS — M25562 Pain in left knee: Secondary | ICD-10-CM | POA: Diagnosis not present

## 2021-03-05 DIAGNOSIS — S83207D Unspecified tear of unspecified meniscus, current injury, left knee, subsequent encounter: Secondary | ICD-10-CM | POA: Diagnosis not present

## 2021-03-05 MED ORDER — MIRABEGRON ER 25 MG PO TB24: 25.0000 mg | ORAL_TABLET | Freq: Every day | ORAL | 5 refills | Status: DC

## 2021-03-05 MED ORDER — TRAZODONE HCL 50 MG PO TABS: 50.0000 mg | ORAL_TABLET | Freq: Every day | ORAL | 5 refills | Status: DC

## 2021-03-05 MED ORDER — TRAMADOL HCL 50 MG PO TABS: 50.0000 mg | ORAL_TABLET | Freq: Four times a day (QID) | ORAL | 1 refills | Status: DC | PRN

## 2021-03-05 NOTE — Patient Instructions (Signed)
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   1. Wait to see Ortho next week to order Outpatient PT and possible OT. - and see how it goes.   2. Might need Ortho to check imaging on R knee- since hurting more than L- sees Ortho next week.   3. Con't Myrbetriq for bladder- 25 mg nightly- 5 refills.   4. Tramadol 50 mg q6 hours- as needed- but if continues past this Rx, will need opiate contract, and UDS.   5. Renew Trazodone 50 mg QHS with 5 refills  6. Call me after Ortho appointment- to discuss EMG vs not; and if needs another referral for surgical possible intervention also if I need ot order outpt PT/OT.   7. F/U in 6 weeks. To see how things going.

## 2021-03-05 NOTE — Progress Notes (Signed)
Subjective:    Patient ID: Jackson Figueroa, male    DOB: 10/14/1961, 60 y.o.   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   Things coming along-  No major setbacks except when does too much-     Sees Ortho in 1 week-for R ulnar neuropathy. Cubital  tunnel syndrome. Sees new PCP tomorrow.   L knee is actually the better of the 2 knees right now.  R nee has been acting up more lately.  Stairs are a real killer for him.   Pool is almost ready, so can start some water therapy.   Taking  Ran out of Tramadol-  wouldn't mind to get more tramadol.    Still taking Myrbetriq and Trazodone; off Duloxetine and Keppra and off Lyrica. No improvement of pain with these meds.   Got steroids for something dental- in mouth- only 5 days worth.  Also got antibiotics.  Thinks had blocked salivary gland. Went back to hospital 4 days after d/c from CIR.    Driving now with no issues. . Is walking much better since got steroids.   Sleeping upstairs in bed- slept in spare bedroom last night- initially, but then got upstairs. Took some rest.   No real muscles spasms anymore-   Not on BP meds anymore- BP well controlled- 133/89 today.    Finishing H/H next week-  Pain Inventory Average Pain 3 Pain Right Now 8 My pain is sharp  In the last 24 hours, has pain interfered with the following? General activity 3 Relation with others 2 Enjoyment of life 5 What TIME of day is your pain at its worst? evening Sleep (in general) Poor  Pain is worse with: standing Pain improves with: rest, heat/ice and therapy/exercise Relief from Meds: 2  use a cane use a walker how many minutes can you walk? 5-30 ability to climb steps?  yes do you drive?  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  bladder control problems weakness trouble walking  HFUT  HFU    Family History  Problem Relation Age of Onset  . Cerebral aneurysm Mother 73  . Heart disease Father   . Migraines Sister    Social History   Socioeconomic History  . Marital status: Significant Other    Spouse name: Not on file  . Number of children: Not on file  . Years of education: Not on file  . Highest education level: Not on file  Occupational History  . Not on file  Tobacco Use  . Smoking status: Former Smoker    Packs/day: 2.00    Types: Cigarettes    Quit date: 03/23/2012    Years since quitting: 8.9  . Smokeless tobacco: Never Used  Vaping Use  . Vaping Use: Never used  Substance and Sexual Activity  . Alcohol use: Yes    Alcohol/week: 2.0 standard drinks    Types: 2 Glasses of wine per week    Comment: occasional wine  . Drug use: Never  . Sexual activity: Not on file  Other Topics Concern  . Not on file  Social History Narrative  . Not on file   Social Determinants of Health   Financial Resource Strain: Not on file  Food Insecurity: Not  on file  Transportation Needs: Not on file  Physical Activity: Not on file  Stress: Not on file  Social Connections: Not on file   Past Surgical History:  Procedure Laterality Date  . APPENDECTOMY  1982  . HERNIA REPAIR N/A    [employer] 03/03/2021  . INGUINAL HERNIA REPAIR Left 2000  . IR GASTROSTOMY TUBE MOD SED  11/21/2020  . IR GASTROSTOMY TUBE REMOVAL  01/17/2021  . KNEE ARTHROSCOPY     bilateral knees  . TONSILLECTOMY AND ADENOIDECTOMY    . TRACHEOSTOMY TUBE PLACEMENT N/A 11/14/2020   Procedure: TRACHEOSTOMY;  Surgeon: Melida Quitter, MD;  Location: [facility];  Service: ENT;  Laterality: N/A;   Past Medical History:  Diagnosis Date  . Allergic   . GERD (gastroesophageal reflux disease)    [employer] 03/03/2021  . Knee pain, bilateral    due to old injuires  . Numbness and tingling in right hand    and elbow due to sports  injury  . Obesity   . Staph infection    of appy wound--treated with antibiotics X 4 weeks.   . Tendinitis of elbow    right   Temp 98.6 F (37 C)   Ht 6' (1.829 m)   Wt 271 lb (122.9 kg)   BMI 36.75 kg/m   Opioid Risk Score:   Fall Risk Score:  `1  Depression screen PHQ 2/9  Depression screen PHQ 2/9 03/05/2021  Decreased Interest 0  Down, Depressed, Hopeless 0  PHQ - 2 Score 0  Altered sleeping 2  Tired, decreased energy 1  Change in appetite 0  Feeling bad or failure about yourself  0  Trouble concentrating 0  Moving slowly or fidgety/restless 0  Suicidal thoughts 0  PHQ-9 Score 3  Difficult doing work/chores Somewhat difficult    Review of Systems  Musculoskeletal:       Knee pain Hand pain  All other systems reviewed and are negative.      Objective:   Physical Exam Awake, alert, appropriate, has RW, on own today, NAD  RUE-5/5 in biceps, triceps, WE and grip is 4-/5 and finger abd 2/5- due ot ulnar neuropathy LUE- 5/5 in all muscles tested RLE- HF 4/5, KE 4/5- painful, DF 4-/5, and PF 5-/5 LLE- HF 4+/5, KE 4+/5, DF 4/5, and PF 5-/5  Neuro: Decreased/absent sensation to light touch  In ulnar distribution of R hand- 4th lateral and 5th digit-   Knees- moderate effusion of R knee- along with overall swelling of knee area- worse on R than Left. Spongy medially- due to fluid- TTP just below patella.          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   1. Wait to see Ortho next week to order Outpatient PT and possible OT. - and see how it goes.   2. Might need Ortho to check imaging on R knee- since hurting more than L- sees Ortho next week.   3. Con't Myrbetriq for bladder- 25 mg nightly- 5 refills.   4. Tramadol 50 mg q6 hours- as needed- but if  continues past this Rx, will need opiate contract, and UDS.   5. Renew Trazodone 50 mg QHS with 5 refills  6. Call me after Ortho appointment- to discuss EMG vs  not; and if needs another referral for surgical possible intervention also if I need ot order outpt PT/OT.   7. F/U in 6 weeks. To see how things going.    I spent a total of 25 minutes on visit- specifically discussing ulnar neuropathy and possible interventions.

## 2021-03-06 ENCOUNTER — Ambulatory Visit (INDEPENDENT_AMBULATORY_CARE_PROVIDER_SITE_OTHER): Payer: BC Managed Care – PPO | Admitting: Internal Medicine

## 2021-03-06 ENCOUNTER — Encounter: Payer: Self-pay | Admitting: Internal Medicine

## 2021-03-06 VITALS — BP 132/88 | HR 90 | Temp 98.4°F | Resp 18 | Ht 72.0 in | Wt 273.4 lb

## 2021-03-06 DIAGNOSIS — Z23 Encounter for immunization: Secondary | ICD-10-CM | POA: Diagnosis not present

## 2021-03-06 DIAGNOSIS — F5101 Primary insomnia: Secondary | ICD-10-CM | POA: Diagnosis not present

## 2021-03-06 DIAGNOSIS — Z7689 Persons encountering health services in other specified circumstances: Secondary | ICD-10-CM | POA: Diagnosis not present

## 2021-03-06 DIAGNOSIS — G7281 Critical illness myopathy: Secondary | ICD-10-CM

## 2021-03-06 DIAGNOSIS — G5621 Lesion of ulnar nerve, right upper limb: Secondary | ICD-10-CM

## 2021-03-06 DIAGNOSIS — Z1211 Encounter for screening for malignant neoplasm of colon: Secondary | ICD-10-CM

## 2021-03-06 DIAGNOSIS — S83207D Unspecified tear of unspecified meniscus, current injury, left knee, subsequent encounter: Secondary | ICD-10-CM

## 2021-03-06 DIAGNOSIS — Z1159 Encounter for screening for other viral diseases: Secondary | ICD-10-CM

## 2021-03-06 DIAGNOSIS — R5381 Other malaise: Secondary | ICD-10-CM | POA: Diagnosis not present

## 2021-03-06 DIAGNOSIS — R32 Unspecified urinary incontinence: Secondary | ICD-10-CM

## 2021-03-06 DIAGNOSIS — R269 Unspecified abnormalities of gait and mobility: Secondary | ICD-10-CM

## 2021-03-06 NOTE — Patient Instructions (Signed)
Please continue taking medications as prescribed.  Please get fasting blood tests done before the next visit.  Please continue to take high protein diet. Ambulate as tolerated with the help of support.  You are being referred to Orthopedic surgeon for evaluation of meniscal tear of left knee.

## 2021-03-07 DIAGNOSIS — R32 Unspecified urinary incontinence: Secondary | ICD-10-CM

## 2021-03-07 DIAGNOSIS — Z7689 Persons encountering health services in other specified circumstances: Secondary | ICD-10-CM | POA: Insufficient documentation

## 2021-03-07 HISTORY — DX: Unspecified urinary incontinence: R32

## 2021-03-07 NOTE — Assessment & Plan Note (Signed)
Recent hospitalization and rehab treatment Follows up with PMNR On Tramadol PRN for severe pain Uses walker

## 2021-03-07 NOTE — Assessment & Plan Note (Signed)
Trazodone PRN

## 2021-03-07 NOTE — Assessment & Plan Note (Signed)
Care established Previous chart reviewed History and medications reviewed with the patient 

## 2021-03-07 NOTE — Assessment & Plan Note (Signed)
S/p mechanical fall Referred to Orthopedic surgery for further management - prefers to see local Orthopedic surgeon

## 2021-03-07 NOTE — Assessment & Plan Note (Signed)
From prolonged hospitalization for acute respiratory failure from COVID infection PT/OT Improving diet and strength better now

## 2021-03-07 NOTE — Assessment & Plan Note (Signed)
On Myrbetriq 

## 2021-03-07 NOTE — Assessment & Plan Note (Signed)
Chronic pain since hospitalization Undergoing PT

## 2021-03-07 NOTE — Assessment & Plan Note (Signed)
Due to critical illness myopathy Uses walker PT

## 2021-03-07 NOTE — Progress Notes (Signed)
New Patient Office Visit  Subjective:  Patient ID: Jackson Figueroa, male    DOB: 1961/09/10  Age: 60 y.o. MRN: [id number]  CC:  Chief Complaint  Patient presents with  . New Patient (Initial Visit)    New patient pt was hospitalized with covid in Nov was in the hospital until March 5 messed up ulnar nerve and also had fall in therapy so it messed up legs     HPI Jackson Figueroa is a 60 year old male with past medical history of recent prolonged hospitalization and rehab placement due to Covid infection who presents for establishing care.  He was admitted with acute respiratory failure due to Covid pneumonia in 09/2020, and was intubated and later was transitioned to tracheostomy placement.  He was discharged to acute rehab facility for continued care until 01/2021.  During the stay in rehab facility, he had a mechanical fall and sustained left knee meniscal injury.  He is going to be evaluated by orthopedic surgeon, but he prefers to see local orthopedic surgeon.  He still follows up with [facility] specialist.  He is undergoing physical therapy for physical deconditioning from prolonged hospital course, and he states that he has been gaining strength.  He tolerates regular diet now.  He is to start tramadol for severe pain.  He takes Tylenol for mild-to-moderate pain.  He received 2 doses of COVID vaccine in 12/2020.   Past Medical History:  Diagnosis Date  . Acute respiratory distress syndrome (ARDS) due to COVID-19 virus ([place])   . Allergic   . Dysphagia   . GERD (gastroesophageal reflux disease)    Phreesia 03/03/2021  . 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 Knee pain, bilateral    due to old injuires  . Numbness and tingling in right hand    and elbow due to sports injury  . Obesity   . Staph infection    of appy wound--treated with antibiotics X 4 weeks.   . Tendinitis of elbow    right    Past Surgical History:  Procedure Laterality Date  . APPENDECTOMY   1982  . HERNIA REPAIR N/A    Phreesia 03/03/2021  . INGUINAL HERNIA REPAIR Left 2000  . IR GASTROSTOMY TUBE MOD SED  11/21/2020  . IR GASTROSTOMY TUBE REMOVAL  01/17/2021  . KNEE ARTHROSCOPY     bilateral knees  . TONSILLECTOMY AND ADENOIDECTOMY    . TRACHEOSTOMY TUBE PLACEMENT N/A 11/14/2020   Procedure: TRACHEOSTOMY;  Surgeon: Melida Quitter, MD;  Location: [facility];  Service: ENT;  Laterality: N/A;    Family History  Problem Relation Age of Onset  . Cerebral aneurysm Mother 54  . Heart disease Father   . Migraines Sister     Social History   Socioeconomic History  . Marital status: Significant Other    Spouse name: Not on file  . Number of children: Not on file  . Years of education: Not on file  . Highest education level: Not on file  Occupational History  . Not on file  Tobacco Use  . Smoking status: Former Smoker    Packs/day: 2.00    Types: Cigarettes    Quit date: 03/23/2012    Years since quitting: 8.9  . Smokeless tobacco: Never Used  Vaping Use  . Vaping Use: Never used  Substance and Sexual Activity  . Alcohol use: Yes    Alcohol/week: 2.0 standard drinks    Types: 2 Glasses of wine per week  Comment: occasional wine  . Drug use: Never  . Sexual activity: Not on file  Other Topics Concern  . Not on file  Social History Narrative  . Not on file   Social Determinants of Health   Financial Resource Strain: Not on file  Food Insecurity: Not on file  Transportation Needs: Not on file  Physical Activity: Not on file  Stress: Not on file  Social Connections: Not on file  Intimate Partner Violence: Not on file    ROS Review of Systems  Constitutional: Positive for fatigue. Negative for chills and fever.  HENT: Negative for congestion and sore throat.   Eyes: Negative for pain and discharge.  Respiratory: Negative for cough and shortness of breath.   Cardiovascular: Negative for chest pain and palpitations.  Gastrointestinal: Negative for  constipation, diarrhea, nausea and vomiting.  Endocrine: Negative for polydipsia and polyuria.  Genitourinary: Negative for dysuria and hematuria.  Musculoskeletal: Positive for arthralgias, back pain and gait problem. Negative for neck pain and neck stiffness.  Skin: Negative for rash.  Neurological: Positive for weakness. Negative for dizziness, numbness and headaches.  Psychiatric/Behavioral: Negative for agitation and behavioral problems.    Objective:   Today's Vitals: BP 132/88 (BP Location: Left Arm, Cuff Size: Normal)   Pulse 90   Temp 98.4 F (36.9 C) (Oral)   Resp 18   Ht 6' (1.829 m)   Wt 273 lb 6.4 oz (124 kg)   SpO2 97%   BMI 37.08 kg/m   Physical Exam Vitals reviewed.  Constitutional:      General: He is not in acute distress.    Appearance: He is not diaphoretic.  HENT:     Head: Normocephalic and atraumatic.     Nose: Nose normal.     Mouth/Throat:     Mouth: Mucous membranes are moist.  Eyes:     General: No scleral icterus.    Extraocular Movements: Extraocular movements intact.  Cardiovascular:     Rate and Rhythm: Normal rate and regular rhythm.     Pulses: Normal pulses.     Heart sounds: Normal heart sounds. No murmur heard.   Pulmonary:     Breath sounds: Normal breath sounds. No wheezing or rales.  Abdominal:     Palpations: Abdomen is soft.     Tenderness: There is no abdominal tenderness.  Musculoskeletal:     Cervical back: Neck supple. No tenderness.     Right lower leg: No edema.     Left lower leg: No edema.  Skin:    General: Skin is warm.     Findings: No rash.  Neurological:     General: No focal deficit present.     Mental Status: He is alert and oriented to person, place, and time.     Sensory: No sensory deficit.     Motor: Weakness (4/5 in b/l LE) present.     Gait: Gait abnormal.  Psychiatric:        Mood and Affect: Mood normal.        Behavior: Behavior normal.     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    Chronic pain since hospitalization Undergoing PT        Musculoskeletal and Integument   Acute meniscal tear of left knee    S/p mechanical fall Referred to Orthopedic surgery for further management - prefers to see local Orthopedic surgeon      Relevant Orders   Ambulatory referral to Orthopedic Surgery     Other   Physical deconditioning    From prolonged hospitalization for acute respiratory failure from COVID infection PT/OT Improving diet and strength better now      Critical illness myopathy    Recent hospitalization and rehab treatment Follows up with PMNR On Tramadol PRN for severe pain Uses walker      Insomnia    Trazodone PRN      Impaired gait    Due to critical illness myopathy Uses walker PT      Urinary incontinence    On Myrbetriq       Other Visit Diagnoses    Need for hepatitis C screening test       Relevant Orders   Hepatitis C Antibody   Need for viral immunization       Relevant Orders   Tdap vaccine greater than or equal to 58yo IM (Completed)   Special screening for malignant neoplasms, colon       Relevant Orders   Ambulatory referral to Gastroenterology      Outpatient Encounter Medications as of 03/06/2021  Medication Sig  . diclofenac Sodium (VOLTAREN) 1 % GEL Apply 2 g topically 4 (four) times daily.  . famotidine (PEPCID) 20 MG tablet Take 1 tablet (20 mg total) by mouth 2 (two) times daily.  Marland Kitchen lidocaine (XYLOCAINE) 5 % ointment Apply topically 4 (four) times daily as needed (for nerve pain in R hand).  . mirabegron ER (MYRBETRIQ) 25 MG TB24 tablet Take 1 tablet (25 mg total) by mouth at  bedtime.  . Multiple Vitamin (MULTIVITAMIN WITH MINERALS) TABS tablet Take 1 tablet by mouth daily.  . traMADol (ULTRAM) 50 MG tablet Take 1 tablet (50 mg total) by mouth every 6 (six) hours as needed for severe pain.  . traZODone (DESYREL) 50 MG tablet Take 1 tablet (50 mg total) by mouth at bedtime.  Marland Kitchen acetaminophen (TYLENOL) 325 MG tablet Take 1-2 tablets (325-650 mg total) by mouth every 4 (four) hours as needed for mild pain. (Patient not taking: No sig reported)  . [DISCONTINUED] DULoxetine (CYMBALTA) 30 MG capsule Take 1 capsule (30 mg total) by mouth at bedtime. (Patient not taking: No sig reported)  . [DISCONTINUED] hydrocerin (EUCERIN) CREA Apply 1 application topically daily. (Patient not taking: No sig reported)  . [DISCONTINUED] levETIRAcetam (KEPPRA) 250 MG tablet Take 1 tablet (250 mg total) by mouth 2 (two) times daily. (Patient not taking: No sig reported)  . [DISCONTINUED] pregabalin (LYRICA) 75 MG capsule Take 1 capsule (75 mg total) by mouth 3 (three) times daily. (Patient not taking: No sig reported)  . [DISCONTINUED] zinc oxide (BALMEX) 11.3 % CREA cream Apply 1 application topically daily. (Patient not taking: No sig reported)   No facility-administered encounter medications on file as of 03/06/2021.    Follow-up: Return in about 3 months (around 06/05/2021) for BP check and blood tests review.   Lindell Spar, MD

## 2021-03-09 DIAGNOSIS — G6281 Critical illness polyneuropathy: Secondary | ICD-10-CM | POA: Diagnosis not present

## 2021-03-10 DIAGNOSIS — J9602 Acute respiratory failure with hypercapnia: Secondary | ICD-10-CM | POA: Diagnosis not present

## 2021-03-10 DIAGNOSIS — U071 COVID-19: Secondary | ICD-10-CM | POA: Diagnosis not present

## 2021-03-10 DIAGNOSIS — J9601 Acute respiratory failure with hypoxia: Secondary | ICD-10-CM | POA: Diagnosis not present

## 2021-03-10 DIAGNOSIS — N182 Chronic kidney disease, stage 2 (mild): Secondary | ICD-10-CM | POA: Diagnosis not present

## 2021-03-10 DIAGNOSIS — N179 Acute kidney failure, unspecified: Secondary | ICD-10-CM | POA: Diagnosis not present

## 2021-03-10 DIAGNOSIS — J1282 Pneumonia due to coronavirus disease 2019: Secondary | ICD-10-CM | POA: Diagnosis not present

## 2021-03-12 DIAGNOSIS — M25521 Pain in right elbow: Secondary | ICD-10-CM | POA: Diagnosis not present

## 2021-03-12 DIAGNOSIS — M79641 Pain in right hand: Secondary | ICD-10-CM | POA: Diagnosis not present

## 2021-03-13 IMAGING — XA IR PERC PLACEMENT GASTROSTOMY
1 series · 3 of 3 positions shown · non-contrast
Comparison: none

INDICATION: Persistent dysphagia, in need of long-term nutrition.

[Series 1: fl (-) angio · 3 of 3 slices shown]
[im 1/3]
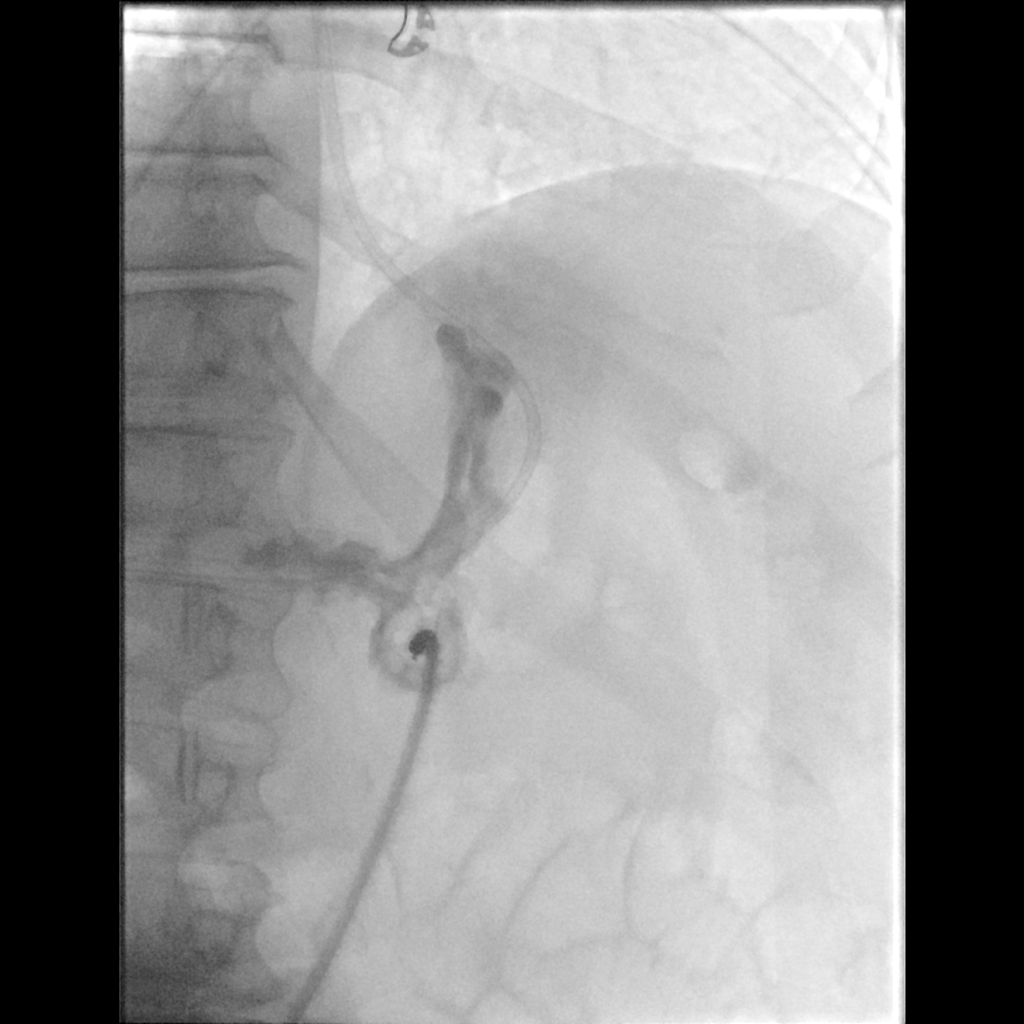
[im 2/3]
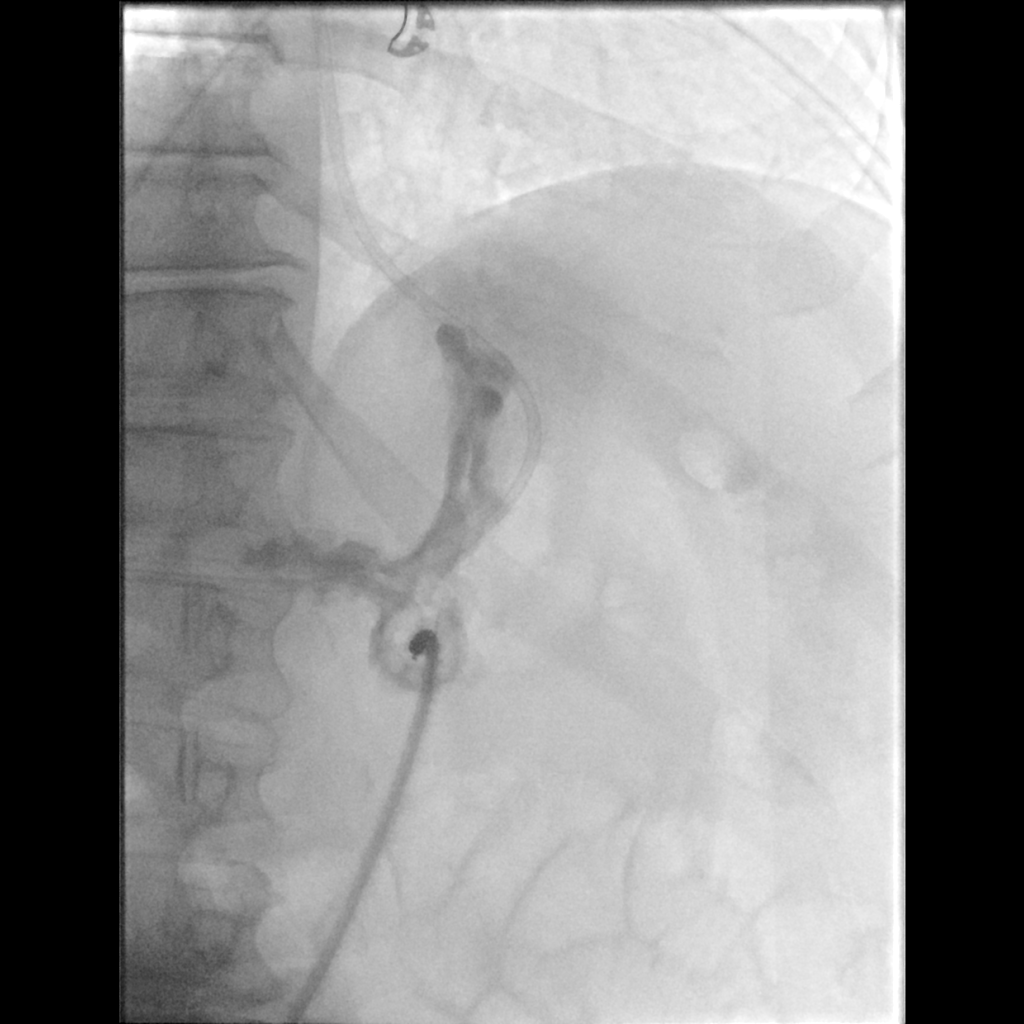
[im 3/3]
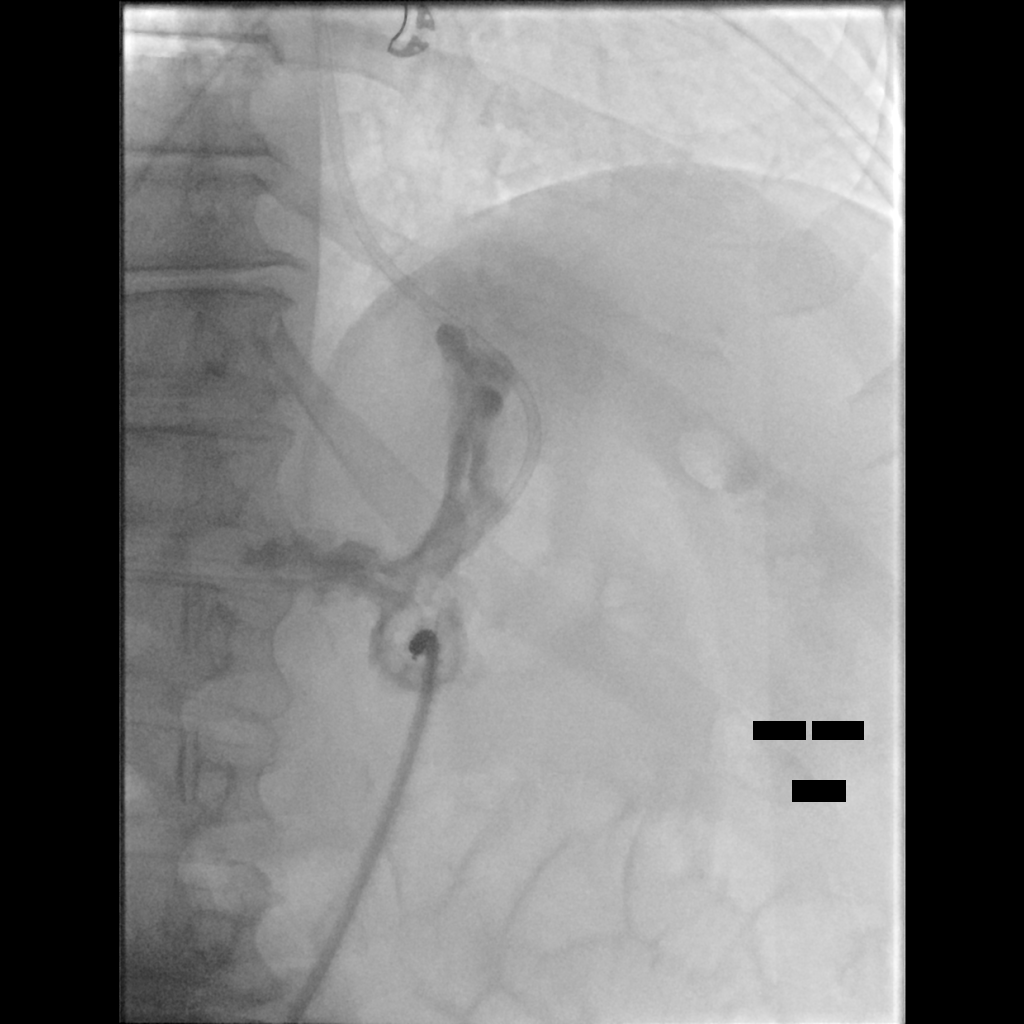

[3 of 3 positions shown; findings below may reference images not displayed]

EXAM:
Fluoroscopically guided placement of percutaneous pull-through
gastrostomy tube

MEDICATIONS:
2 g Ancef, 1 mg glucagon; Antibiotics were administered within 1
hour of the procedure.

ANESTHESIA/SEDATION:
Versed 1 mg IV; Fentanyl 50 mcg IV

Moderate Sedation Time:  13 minutes

The patient was continuously monitored during the procedure by the
interventional radiology nurse under my direct supervision.

CONTRAST:  5mL OMNIPAQUE IOHEXOL 300 MG/ML  SOLN

FLUOROSCOPY TIME:  Fluoroscopy Time: 3 minutes 6 seconds (37 mGy).

COMPLICATIONS:
None immediate.

PROCEDURE:
Informed written consent was obtained from the patient after a
thorough discussion of the procedural risks, benefits and
alternatives. All questions were addressed. Maximal Sterile Barrier
Technique was utilized including caps, mask, sterile gowns, sterile
gloves, sterile drape, hand hygiene and skin antiseptic. A timeout
was performed prior to the initiation of the procedure.

Maximal barrier sterile technique utilized including caps, mask,
sterile gowns, sterile gloves, large sterile drape, hand hygiene,
and chlorhexadine skin prep.

An angled catheter was advanced over a wire under fluoroscopic
guidance through the nose, down the esophagus and into the body of
the stomach. The stomach was then insufflated with several 100 ml of
air. Fluoroscopy confirmed location of the gastric bubble, as well
as inferior displacement of the barium stained colon. Under direct
fluoroscopic guidance, a single T-tack was placed, and the anterior
gastric wall drawn up against the anterior abdominal wall.
Percutaneous access was then obtained into the mid gastric body with
an 18 gauge sheath needle. Aspiration of air, and injection of
contrast material under fluoroscopy confirmed needle placement.

An Amplatz wire was advanced in the gastric body and the access
needle exchanged for a 9-French vascular sheath. A snare device was
advanced through the vascular sheath and an Amplatz wire advanced
through the angled catheter. The Amplatz wire was successfully
snared and this was pulled up through the esophagus and out the
mouth. A 20-Hiroko Dominic tube was then connected to
the snare and pulled through the mouth, down the esophagus, into the
stomach and out to the anterior abdominal wall. Hand injection of
contrast material confirmed intragastric location. The T-tack
retention suture was then cut. The pull through peg tube was then
secured with the external bumper and capped.

The patient will be observed for several hours with the newly placed
tube on low wall suction to evaluate for any post procedure
complication. The patient tolerated the procedure well, there is no
immediate complication.
IMPRESSION: Successful placement of a 20 French pull through gastrostomy tube.

## 2021-03-17 DIAGNOSIS — G5621 Lesion of ulnar nerve, right upper limb: Secondary | ICD-10-CM | POA: Diagnosis not present

## 2021-03-17 DIAGNOSIS — G5601 Carpal tunnel syndrome, right upper limb: Secondary | ICD-10-CM | POA: Diagnosis not present

## 2021-03-19 IMAGING — DX DG CHEST 1V PORT
1 series · 1 of 1 positions shown · non-contrast
Comparison: 11/16/2020.

CLINICAL DATA: Respiratory failure.

EXAM:
PORTABLE CHEST 1 VIEW

[chest ap]
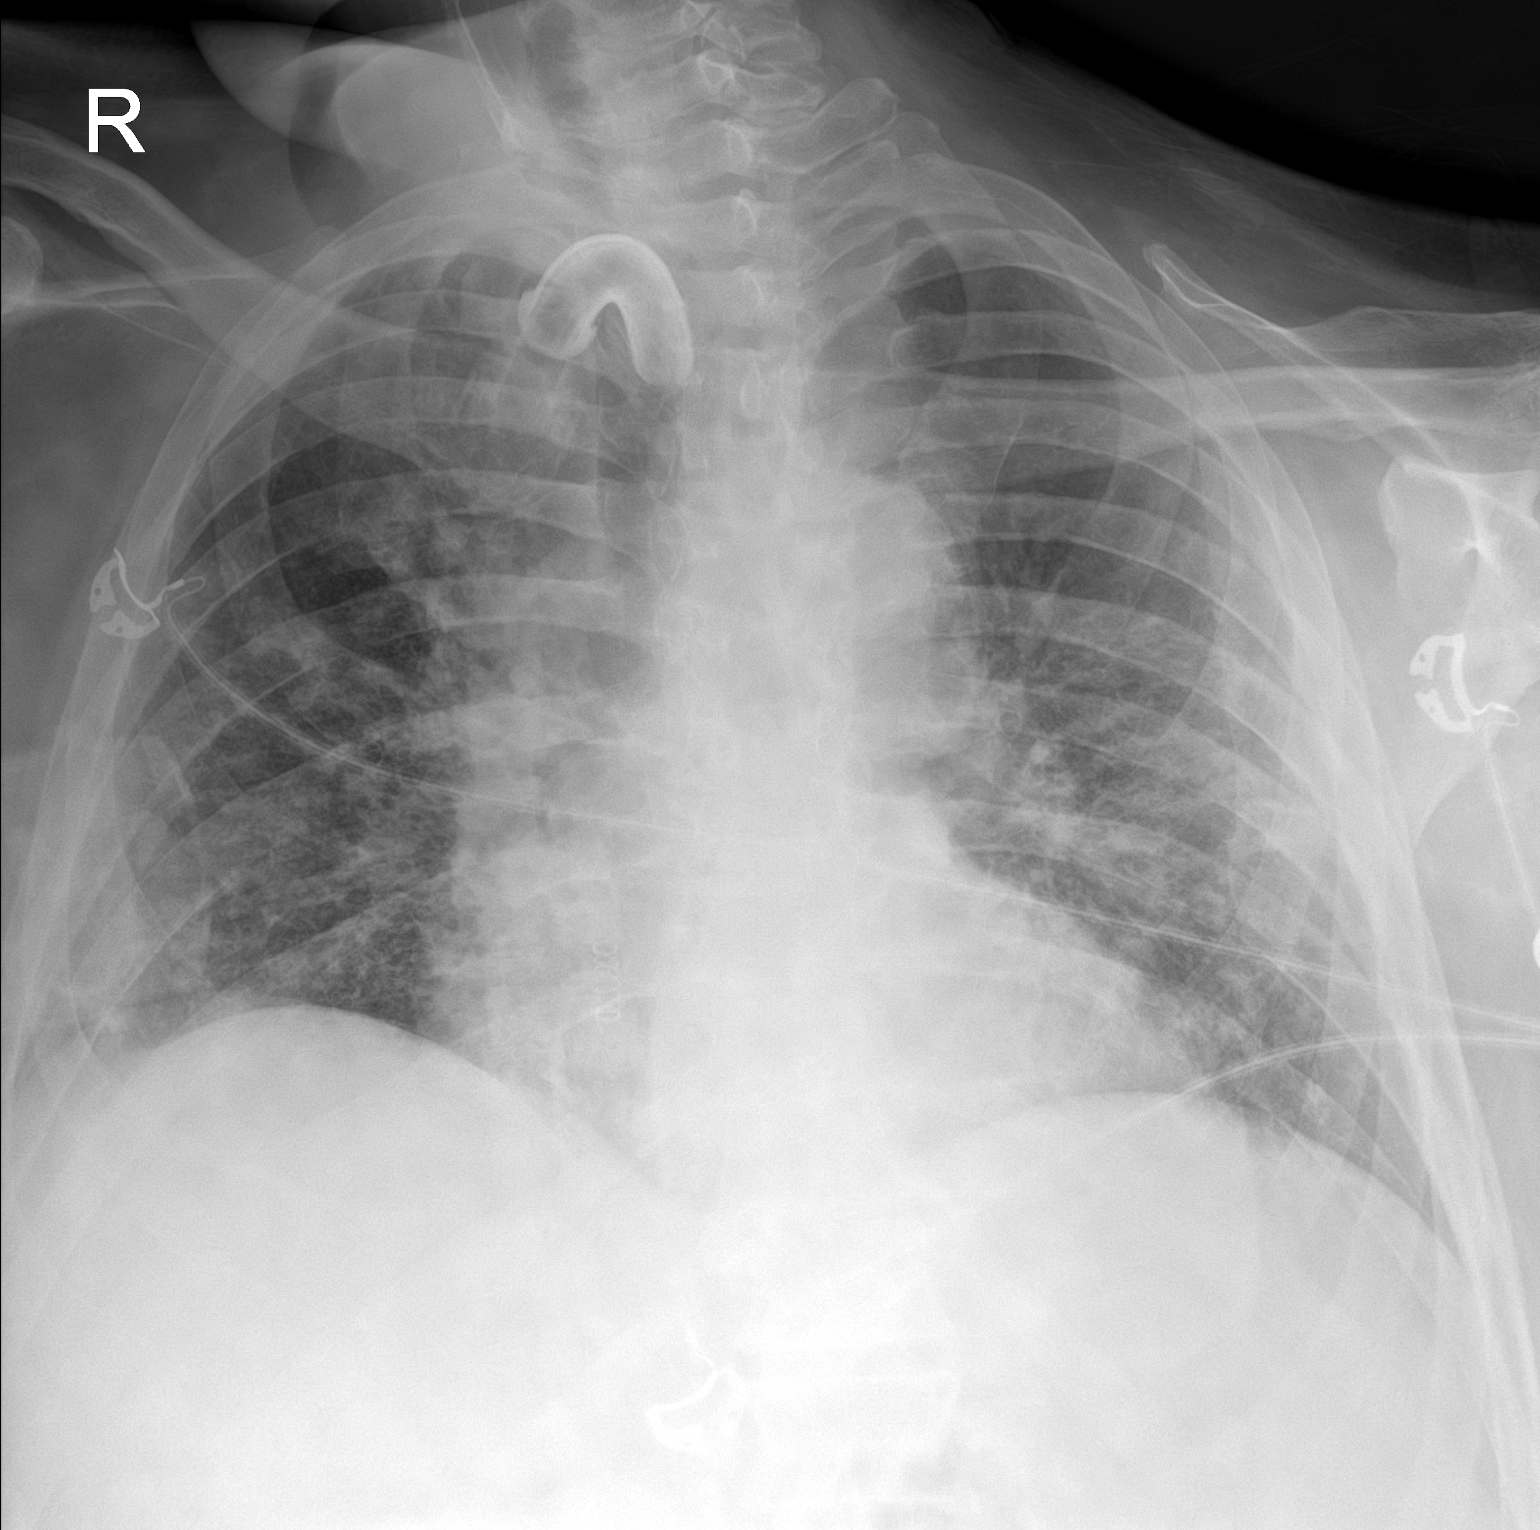

[1 of 1 positions shown; findings below may reference images not displayed]

FINDINGS: Interim removal of feeding tube. Tracheostomy tube in stable
position. Stable cardiomegaly. Venous congestion. Persistent
bilateral interstitial infiltrates with slight improvement in
aeration on today's exam. No pleural effusion or pneumothorax.
IMPRESSION: 1. Interim removal of feeding tube. Tracheostomy tube in stable
position.
2. Persistent bilateral interstitial infiltrates with slight
improvement in aeration on today's exam.
3. Stable cardiomegaly.  No pulmonary venous congestion.

## 2021-03-20 ENCOUNTER — Other Ambulatory Visit: Payer: Self-pay

## 2021-03-20 ENCOUNTER — Ambulatory Visit (INDEPENDENT_AMBULATORY_CARE_PROVIDER_SITE_OTHER): Payer: BC Managed Care – PPO | Admitting: Orthopedic Surgery

## 2021-03-20 ENCOUNTER — Encounter: Payer: Self-pay | Admitting: Orthopedic Surgery

## 2021-03-20 VITALS — BP 173/106 | HR 77 | Ht 72.0 in | Wt 270.0 lb

## 2021-03-20 DIAGNOSIS — M25461 Effusion, right knee: Secondary | ICD-10-CM | POA: Diagnosis not present

## 2021-03-20 DIAGNOSIS — G6281 Critical illness polyneuropathy: Secondary | ICD-10-CM | POA: Diagnosis not present

## 2021-03-20 DIAGNOSIS — M25462 Effusion, left knee: Secondary | ICD-10-CM | POA: Diagnosis not present

## 2021-03-20 DIAGNOSIS — M170 Bilateral primary osteoarthritis of knee: Secondary | ICD-10-CM | POA: Diagnosis not present

## 2021-03-20 NOTE — Progress Notes (Addendum)
NEW PROBLEM//OFFICE VISIT  Summary assessment and plan:   Encounter Diagnoses  Name Primary?  . Effusion of left knee Yes  . Effusion, right knee   . Primary osteoarthritis of both knees     Aspiration injection left knee  Ice  OTC NSAIDs as needed  Follow-up 3 months  Chief Complaint  Patient presents with  . Knee Pain    Left knee meniscal/ACL per MRI    60 year old male who had a bad case of COVID requiring 5 months of treatment in the ICU and hospitalization currently undergoing rehabilitation with a physical therapist and rehab medicine physician presents for evaluation of his left knee status post fall on February 25 while performing physical therapy at [facility]  He has a history of problems with both knees dating back to 2007.  He had a tear of his medial meniscus of his left knee which was treated with arthroscopic surgery and then 2014 he had arthroscopic surgery right knee for torn medial meniscus  He now complains of achiness sometimes severe pain weakness and trouble climbing and descending the stairs with increased swelling depending on his activity  Rehabilitation specialist thought he should see orthopedist for possible injections for his arthritis  He is already had an x-ray which shows 3 compartment disease and an MRI which confirms arthritis in all 3 compartments and a degenerative meniscal tear medially   Review of Systems  HENT: Positive for tinnitus.   Cardiovascular: Positive for leg swelling.  Endo/Heme/Allergies: Positive for environmental allergies.     Past Medical History:  Diagnosis Date  . Acute respiratory distress syndrome (ARDS) due to COVID-19 virus ([place])   . Allergic   . Dysphagia   . GERD (gastroesophageal reflux disease)    Phreesia 03/03/2021  . 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 Knee pain, bilateral    due to old injuires  . Numbness and tingling in right hand    and elbow due to sports injury  .  Obesity   . Staph infection    of appy wound--treated with antibiotics X 4 weeks.   . Tendinitis of elbow    right    Past Surgical History:  Procedure Laterality Date  . APPENDECTOMY  1982  . HERNIA REPAIR N/A    Phreesia 03/03/2021  . INGUINAL HERNIA REPAIR Left 2000  . IR GASTROSTOMY TUBE MOD SED  11/21/2020  . IR GASTROSTOMY TUBE REMOVAL  01/17/2021  . KNEE ARTHROSCOPY     bilateral knees  . TONSILLECTOMY AND ADENOIDECTOMY    . TRACHEOSTOMY TUBE PLACEMENT N/A 11/14/2020   Procedure: TRACHEOSTOMY;  Surgeon: Melida Quitter, MD;  Location: [facility];  Service: ENT;  Laterality: N/A;    Family History  Problem Relation Age of Onset  . Cerebral aneurysm Mother 35  . Heart disease Father   . Migraines Sister    Social History   Tobacco Use  . Smoking status: Former Smoker    Packs/day: 2.00    Types: Cigarettes    Quit date: 03/23/2012    Years since quitting: 8.9  . Smokeless tobacco: Never Used  Vaping Use  . Vaping Use: Never used  Substance Use Topics  . Alcohol use: Yes    Alcohol/week: 2.0 standard drinks    Types: 2 Glasses of wine per week    Comment: occasional wine  . Drug use: Never    No Known Allergies  Current Meds  Medication Sig  . acetaminophen (TYLENOL) 325 MG tablet  Take 1-2 tablets (325-650 mg total) by mouth every 4 (four) hours as needed for mild pain.  Marland Kitchen diclofenac Sodium (VOLTAREN) 1 % GEL Apply 2 g topically 4 (four) times daily.  . famotidine (PEPCID) 20 MG tablet Take 1 tablet (20 mg total) by mouth 2 (two) times daily.  Marland Kitchen lidocaine (XYLOCAINE) 5 % ointment Apply topically 4 (four) times daily as needed (for nerve pain in R hand).  . mirabegron ER (MYRBETRIQ) 25 MG TB24 tablet Take 1 tablet (25 mg total) by mouth at bedtime.  . Multiple Vitamin (MULTIVITAMIN WITH MINERALS) TABS tablet Take 1 tablet by mouth daily.  . traMADol (ULTRAM) 50 MG tablet Take 1 tablet (50 mg total) by mouth every 6 (six) hours as needed for severe pain.  .  traZODone (DESYREL) 50 MG tablet Take 1 tablet (50 mg total) by mouth at bedtime.    BP (!) 173/106   Pulse 77   Ht 6' (1.829 m)   Wt 270 lb (122.5 kg)   BMI 36.62 kg/m   Physical Exam  General appearance: Well-developed well-nourished no gross deformities  Cardiovascular normal pulse and perfusion normal color without edema  Neurologically o sensation loss or deficits or pathologic reflexes  Psychological: Awake alert and oriented x3 mood and affect normal  Skin no lacerations or ulcerations no nodularity no palpable masses, no erythema or nodularity  Musculoskeletal:   Right knee has a small effusion skin is intact he has full range of motion with tenderness on the medial joint line ligaments feel stable muscle tone is normal  Left knee has a very large effusion Medial joint line is tender This range of motion is 125 degrees with a slight flexion contracture Muscle tone is normal All ligaments are stable     MEDICAL DECISION MAKING  A.  Encounter Diagnoses  Name Primary?  . Effusion of left knee Yes  . Effusion, right knee     B. DATA ANALYSED:   IMAGING: Interpretation of images: External imaging and MRI shows 3 compartment disease degenerative tear medial meniscus partial tearing of the ACL  X-ray left knee Medial compartment joint space narrowing to osteophytes 1 femoral moderate size 1 tibial smaller.  Lateral compartment looks normal this is a 4 view knee dated January 22, 2021   Orders: No new orders  Outside records reviewed: None   C. MANAGEMENT   Nonoperative management aspiration injection left knee ice as needed over-the-counter NSAIDs as needed come back in 3 months  Procedure aspiration injection left knee  Patient is prepped for left knee aspiration injection he gave verbal consent we confirmed with timeout  We used a lateral suprapatellar approach after covering the knee with alcohol and ethyl chloride we aspirated approximate 120 cc of  amber-colored fluid, 6 mg Celestone injected  He tolerated this well without complication  No orders of the defined types were placed in this encounter.     Arther Abbott, MD  03/20/2021 9:57 AM

## 2021-03-20 NOTE — Patient Instructions (Signed)
You have received an injection of steroids into the joint. 15% of patients will have increased pain within the 24 hours postinjection.   This is transient and will go away.   We recommend that you use ice packs on the injection site for 20 minutes every 2 hours and extra strength Tylenol 2 tablets every 8 as needed until the pain resolves.  If you continue to have pain after taking the Tylenol and using the ice please call the office for further instructions.  Ice as needed for swelling   advil or aleve as needed for pain

## 2021-03-21 DIAGNOSIS — M79641 Pain in right hand: Secondary | ICD-10-CM | POA: Diagnosis not present

## 2021-03-21 DIAGNOSIS — M25521 Pain in right elbow: Secondary | ICD-10-CM | POA: Diagnosis not present

## 2021-03-21 DIAGNOSIS — G5601 Carpal tunnel syndrome, right upper limb: Secondary | ICD-10-CM | POA: Diagnosis not present

## 2021-04-08 DIAGNOSIS — G6281 Critical illness polyneuropathy: Secondary | ICD-10-CM | POA: Diagnosis not present

## 2021-04-08 IMAGING — DX DG CHEST 1V PORT
1 series · 1 of 1 positions shown · non-contrast
Comparison: 11/27/2020.

CLINICAL DATA: Acute respiratory failure.  History of COVID.

EXAM:
PORTABLE CHEST 1 VIEW

[chest ap]
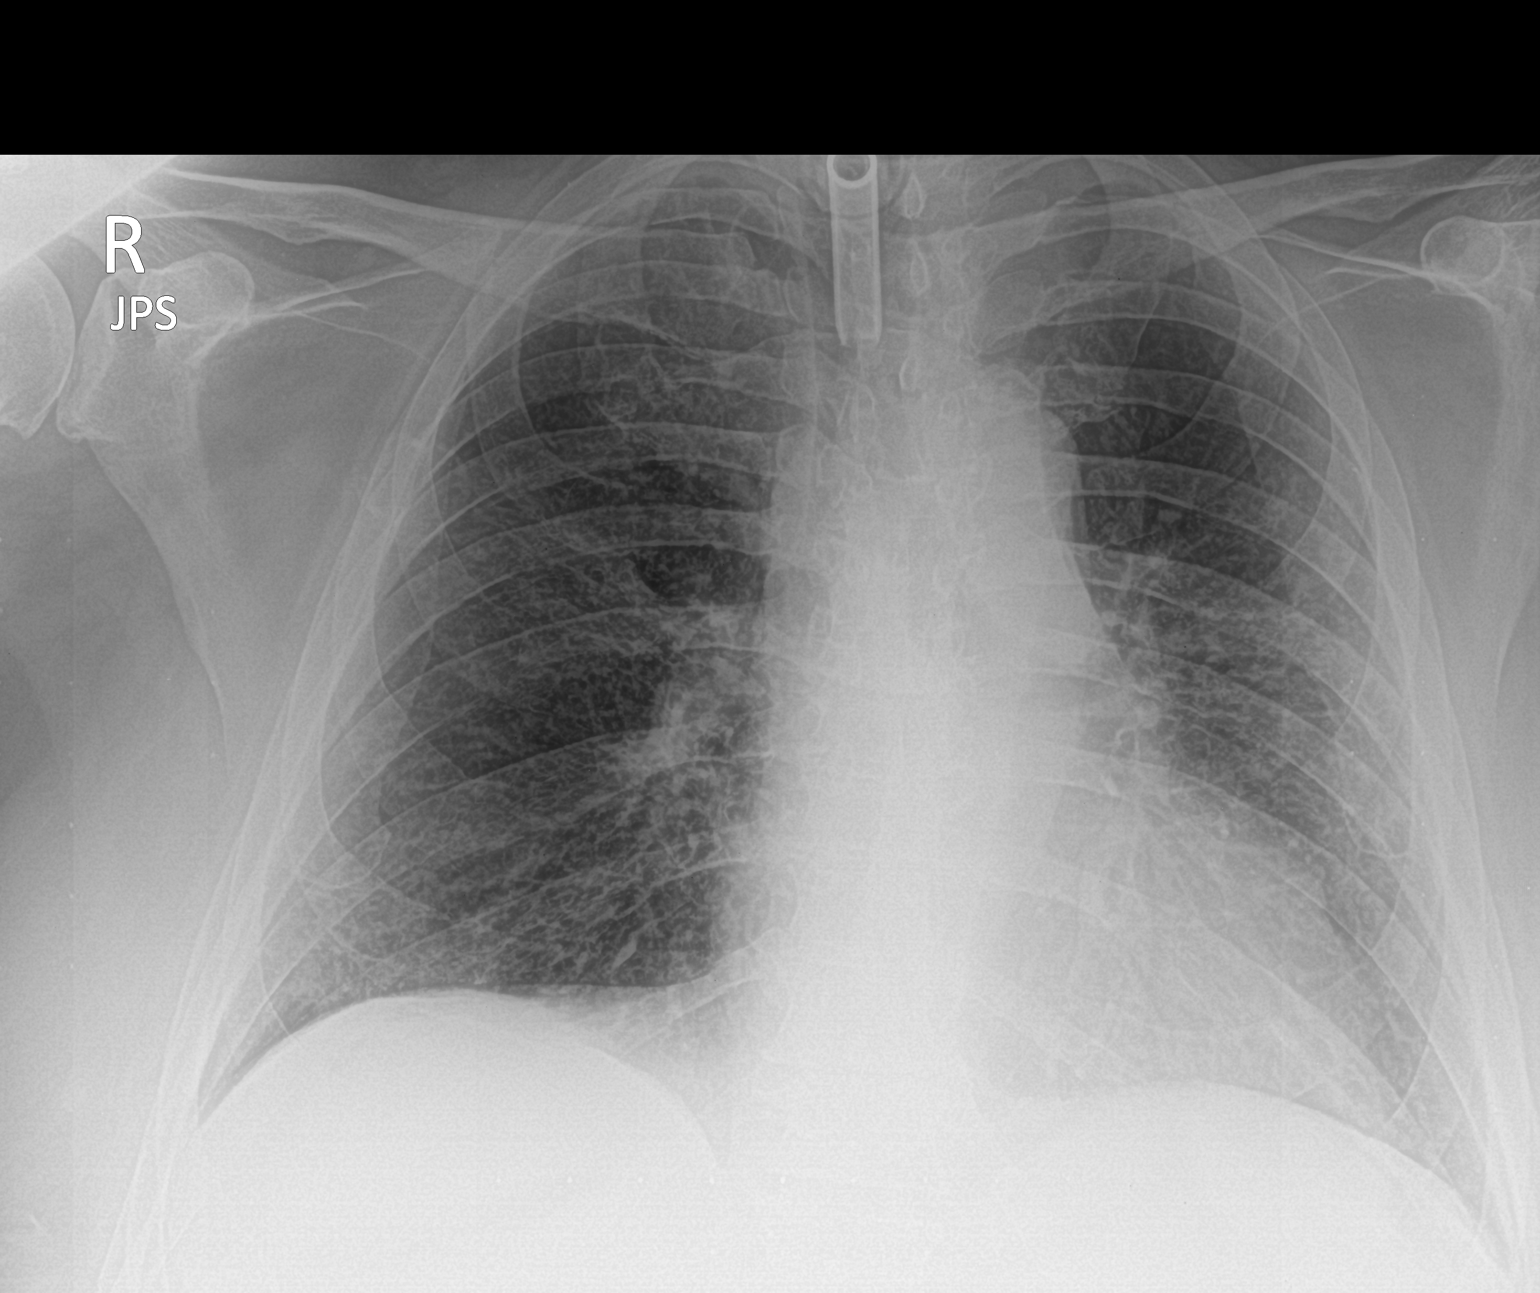

[1 of 1 positions shown; findings below may reference images not displayed]

FINDINGS: Tracheostomy tube in stable position. Heart size normal. Persistent
but improved bilateral interstitial prominence. No pleural effusion
or pneumothorax. Degenerative change thoracic spine.
IMPRESSION: 1. Tracheostomy tube in stable position.
2. Persistent but improved bilateral interstitial prominence.
Findings suggest improving interstitial edema and or pneumonitis.

## 2021-04-17 DIAGNOSIS — G5621 Lesion of ulnar nerve, right upper limb: Secondary | ICD-10-CM | POA: Diagnosis not present

## 2021-04-19 DIAGNOSIS — G6281 Critical illness polyneuropathy: Secondary | ICD-10-CM | POA: Diagnosis not present

## 2021-04-25 ENCOUNTER — Encounter
Payer: BC Managed Care – PPO | Attending: Physical Medicine and Rehabilitation | Admitting: Physical Medicine and Rehabilitation

## 2021-04-25 ENCOUNTER — Encounter: Payer: Self-pay | Admitting: Physical Medicine and Rehabilitation

## 2021-04-25 ENCOUNTER — Telehealth: Payer: Self-pay | Admitting: *Deleted

## 2021-04-25 ENCOUNTER — Other Ambulatory Visit: Payer: Self-pay

## 2021-04-25 VITALS — BP 128/91 | HR 73 | Temp 98.5°F | Ht 72.0 in | Wt 284.4 lb

## 2021-04-25 DIAGNOSIS — G894 Chronic pain syndrome: Secondary | ICD-10-CM | POA: Insufficient documentation

## 2021-04-25 DIAGNOSIS — Z79891 Long term (current) use of opiate analgesic: Secondary | ICD-10-CM | POA: Diagnosis not present

## 2021-04-25 DIAGNOSIS — Z5181 Encounter for therapeutic drug level monitoring: Secondary | ICD-10-CM | POA: Diagnosis not present

## 2021-04-25 MED ORDER — TRAMADOL HCL 50 MG PO TABS: 50.0000 mg | ORAL_TABLET | Freq: Four times a day (QID) | ORAL | 1 refills | Status: DC | PRN

## 2021-04-25 NOTE — Telephone Encounter (Signed)
Notified. 

## 2021-04-25 NOTE — Progress Notes (Signed)
Subjective:    Patient ID: Jackson Figueroa, male    DOB: 05-27-1961, 60 y.o.   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   Having surgery on R wrist and elbow - carpal tunnel release and cubital tunnel release- doing nerve rerouting- in 3 weeks- Dr Caralyn Guile is doing it- will be outpt.   Walking pretty well- not using any assistance device.  Can walk through an entire smaller grocery store.  Averaging 5000 steps/day- stairs are a challenge still-  Home- needs to go in 7 stairs- bedroom is also upstairs- and pool is 14 steps down.   Doing some water therapy in his pool at home- ~3-4 days/week.  Pt is right handed, and has very difficult time using RUE due to needing carpal tunnel surgery as well as cubital tunnel surgery and ulnar transposition of RUE.  Takes increased time to do ADLs- will be out of commission 2 weeks - With hard cast after pending surgery.  Surgeon said 1-2 months until healing is complete i.e. using  RUE.   Cannot type properly- only finger peck with R hand (Right handed).  And won't be able to for 1-2 months AFTER surgery - 05/14/21.   Didn't check imaging on R knee- only Left.  R has a Baker's cyst.  Got steroid injection and drainage of L knee ~ 1 month ago- lasted a whole 2 days! Were more concerned about the L knee. 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     Bladder doing well- had an Rx for Myrbetriq- but wanted $400- muscles are getting better- not going all the time anymore and no accidents or "preaccidents" anymore.  Not rushing to get to bathroom.   Bottom of feet get tingling- quite often.    Pain getting better- RUE/wrist still numb/tingling and cold, but getting better.   Knees bother  him with use- walking, chores, etc.   Brain fogginess not too bad- memory not what is used to be-  Better than when in hospital.   Takes Tylenol or Tramadol or Ibuprofen- 600 mg - 3x/week usually.  Tramadol- usually at night- ~ 3x/week.    Pain Inventory Average Pain 4 Pain Right Now 4 My pain is intermittent, constant, stabbing, tingling and aching  In the last 24 hours, has pain interfered with the following? General activity 3 Relation with others 0 Enjoyment of life 3 What TIME of day is your pain at its worst? daytime Sleep (in general) Fair  Pain is worse with: sitting, standing and some activites Pain improves with: rest, heat/ice, medication and injections Relief from Meds: 4  Family History  Problem Relation Age of Onset  . Cerebral aneurysm Mother 66  . Heart disease Father   . Migraines Sister    Social History   Socioeconomic History  . Marital status: Significant Other    Spouse name: Not on file  . Number of children: Not on file  . Years of education: Not on file  . Highest education level: Not on file  Occupational History  . Not on file  Tobacco Use  . Smoking status: Former Smoker    Packs/day: 2.00    Types: Cigarettes    Quit  date: 03/23/2012    Years since quitting: 9.0  . Smokeless tobacco: Never Used  Vaping Use  . Vaping Use: Never used  Substance and Sexual Activity  . Alcohol use: Yes    Alcohol/week: 2.0 standard drinks    Types: 2 Glasses of wine per week    Comment: occasional wine  . Drug use: Never  . Sexual activity: Not on file  Other Topics Concern  . Not on file  Social History Narrative  . Not on file   Social Determinants of Health   Financial Resource Strain: Not on file  Food Insecurity: Not on file  Transportation Needs: Not on file  Physical Activity: Not on file  Stress: Not on file  Social Connections: Not on file   Past Surgical History:  Procedure Laterality Date  . APPENDECTOMY  1982  . HERNIA REPAIR  N/A    Phreesia 03/03/2021  . INGUINAL HERNIA REPAIR Left 2000  . IR GASTROSTOMY TUBE MOD SED  11/21/2020  . IR GASTROSTOMY TUBE REMOVAL  01/17/2021  . KNEE ARTHROSCOPY     bilateral knees  . TONSILLECTOMY AND ADENOIDECTOMY    . TRACHEOSTOMY TUBE PLACEMENT N/A 11/14/2020   Procedure: TRACHEOSTOMY;  Surgeon: Melida Quitter, MD;  Location: [facility];  Service: ENT;  Laterality: N/A;   Past Surgical History:  Procedure Laterality Date  . APPENDECTOMY  1982  . HERNIA REPAIR N/A    [employer]reesia 03/03/2021  . INGUINAL HERNIA REPAIR Left 2000  . IR GASTROSTOMY TUBE MOD SED  11/21/2020  . IR GASTROSTOMY TUBE REMOVAL  01/17/2021  . KNEE ARTHROSCOPY     bilateral knees  . TONSILLECTOMY AND ADENOIDECTOMY    . TRACHEOSTOMY TUBE PLACEMENT N/A 11/14/2020   Procedure: TRACHEOSTOMY;  Surgeon: Melida Quitter, MD;  Location: [facility];  Service: ENT;  Laterality: N/A;   Past Medical History:  Diagnosis Date  . Acute respiratory distress syndrome (ARDS) due to COVID-19 virus ([place])   . Allergic   . Dysphagia   . GERD (gastroesophageal reflux disease)    Phreesia 03/03/2021  . 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 Knee pain, bilateral    due to old injuires  . Numbness and tingling in right hand    and elbow due to sports injury  . Obesity   . Staph infection    of appy wound--treated with antibiotics X 4 weeks.   . Tendinitis of elbow    right   BP (!) 128/91   Pulse 73   Temp 98.5 F (36.9 C)   Ht 6' (1.829 m)   Wt 284 lb 6.4 oz (129 kg)   SpO2 97%   BMI 38.57 kg/m   Opioid Risk Score:   Fall Risk Score:  `1  Depression screen PHQ 2/9  Depression screen [facility] 2/9 03/06/2021 03/05/2021  Decreased Interest 0 0  Down, Depressed, Hopeless 0 0  PHQ - 2 Score 0 0  Altered sleeping - 2  Tired, decreased energy - 1  Change in appetite - 0  Feeling bad or failure about yourself  - 0  Trouble concentrating - 0  Moving slowly or fidgety/restless - 0  Suicidal thoughts - 0   PHQ-9 Score - 3  Difficult doing work/chores Not difficult at all Somewhat difficult    Review of Systems  Musculoskeletal:       Hand and knee pain  Neurological: Positive for numbness.       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 1. Read up on COVID toes- and if toes turn colors/get cold, call PCP and get appointment.   2. Still has brain fogginess- mild- but due to long COVID most likely- will improve with time.   3. 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 6/22- will take a minimum of 8 weeks to recover. Will reassess at next visit.   4. Does need refill for tramadol- don't think needs long term- but if needs me to change, [facility]- [facility]- for now.   5.  F/U in 2-2.5 months.   I spent a total of 32 minutes on visit- going over ability to return to work, and going over concerns raised by [employer] and addressing those. Also  determining disability status.

## 2021-04-25 NOTE — Patient Instructions (Signed)
  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 1. Read up on COVID toes- and if toes turn colors/get cold, call PCP and get appointment.   2. Still has brain fogginess- mild- but due to long COVID most likely- will improve with time.   3. 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 6/22- will take a minimum of 8 weeks to recover. Will reassess at next visit.   4. Does need refill for tramadol- don't think needs long term- but if needs me to change, [facility]- but sent to [facility] for now.   5.  F/U in 2-2.5 months.

## 2021-04-25 NOTE — Telephone Encounter (Signed)
Mr Kinner called and [facility] can not fill his Tramadol, his ins requires it go to [facility].  I have added the pharmacy so you can resend it to the mail order pharmacy.

## 2021-05-02 LAB — TOXASSURE SELECT,+ANTIDEPR,UR

## 2021-05-07 ENCOUNTER — Telehealth: Payer: Self-pay | Admitting: *Deleted

## 2021-05-07 NOTE — Telephone Encounter (Signed)
Urine drug screen for this encounter is positive for alcohol. No other medication present.

## 2021-05-09 DIAGNOSIS — G6281 Critical illness polyneuropathy: Secondary | ICD-10-CM | POA: Diagnosis not present

## 2021-05-14 DIAGNOSIS — G5621 Lesion of ulnar nerve, right upper limb: Secondary | ICD-10-CM | POA: Diagnosis not present

## 2021-05-14 DIAGNOSIS — G8918 Other acute postprocedural pain: Secondary | ICD-10-CM | POA: Diagnosis not present

## 2021-05-14 DIAGNOSIS — G5601 Carpal tunnel syndrome, right upper limb: Secondary | ICD-10-CM | POA: Diagnosis not present

## 2021-05-16 ENCOUNTER — Other Ambulatory Visit (INDEPENDENT_AMBULATORY_CARE_PROVIDER_SITE_OTHER): Payer: Self-pay

## 2021-05-16 ENCOUNTER — Encounter (INDEPENDENT_AMBULATORY_CARE_PROVIDER_SITE_OTHER): Payer: Self-pay

## 2021-05-16 ENCOUNTER — Telehealth (INDEPENDENT_AMBULATORY_CARE_PROVIDER_SITE_OTHER): Payer: Self-pay

## 2021-05-16 DIAGNOSIS — Z1211 Encounter for screening for malignant neoplasm of colon: Secondary | ICD-10-CM

## 2021-05-16 MED ORDER — PEG 3350-KCL-NA BICARB-NACL 420 G PO SOLR: 4000.0000 mL | ORAL | 0 refills | Status: DC

## 2021-05-16 NOTE — Telephone Encounter (Signed)
Ok to schedule. Thanks

## 2021-05-16 NOTE — Telephone Encounter (Signed)
Referring MD/PCP: Posey Pronto  Procedure: Tcs  Reason/Indication:  Screening  Has patient had this procedure before?  no  If so, when, by whom and where?    Is there a family history of colon cancer?  no  Who?  What age when diagnosed?    Is patient diabetic? If yes, Type 1 or Type 2   no      Does patient have prosthetic heart valve or mechanical valve?  no  Do you have a pacemaker/defibrillator?  no  Has patient ever had endocarditis/atrial fibrillation? no  Does patient use oxygen? no  Has patient had joint replacement within last 12 months?  no  Is patient constipated or do they take laxatives? no  Does patient have a history of alcohol/drug use?  no  Have you had a stroke/heart attack last 6 mths? no  Do you take medicine for weight loss?  no  For male patients,: do you still have your menstrual cycle? N/A  Is patient on blood thinner such as Coumadin, Plavix and/or Aspirin? no  Medications: Tylenol prn, voltaren qid, famotidine 20mg  bid, lidocaine ointment qid, myrebetriq 25 mg daily, mvi daily,  tramadol 50 mg 1 tab every 6 hrs, tradone 50 mg daily  Allergies: nkda  Medication Adjustment per Dr Jenetta Downer none  Procedure date & time: 06/18/21 9:15

## 2021-05-16 NOTE — Telephone Encounter (Signed)
[name], CMA  

## 2021-05-20 DIAGNOSIS — G6281 Critical illness polyneuropathy: Secondary | ICD-10-CM | POA: Diagnosis not present

## 2021-05-29 DIAGNOSIS — M25522 Pain in left elbow: Secondary | ICD-10-CM | POA: Diagnosis not present

## 2021-05-29 DIAGNOSIS — N1831 Chronic kidney disease, stage 3a: Secondary | ICD-10-CM | POA: Diagnosis not present

## 2021-05-29 DIAGNOSIS — I10 Essential (primary) hypertension: Secondary | ICD-10-CM | POA: Diagnosis not present

## 2021-05-29 DIAGNOSIS — D638 Anemia in other chronic diseases classified elsewhere: Secondary | ICD-10-CM | POA: Diagnosis not present

## 2021-05-29 DIAGNOSIS — E46 Unspecified protein-calorie malnutrition: Secondary | ICD-10-CM | POA: Diagnosis not present

## 2021-05-30 LAB — LIPID PANEL
Chol/HDL Ratio: 4.2 ratio (ref 0.0–5.0)
Cholesterol, Total: 191 mg/dL (ref 100–199)
HDL: 45 mg/dL (ref >39)
LDL Chol Calc (NIH): 127 mg/dL — ABNORMAL HIGH (ref 0–99)
Triglycerides: 103 mg/dL (ref 0–149)
VLDL Cholesterol Cal: 19 mg/dL (ref 5–40)

## 2021-05-30 LAB — CMP14+EGFR
ALT: 18 IU/L (ref 0–44)
AST: 18 IU/L (ref 0–40)
Albumin/Globulin Ratio: 2.3 — ABNORMAL HIGH (ref 1.2–2.2)
Albumin: 4.5 g/dL (ref 3.8–4.9)
Alkaline Phosphatase: 61 IU/L (ref 44–121)
BUN/Creatinine Ratio: 17 (ref 9–20)
BUN: 28 mg/dL — ABNORMAL HIGH (ref 6–24)
Bilirubin Total: 0.4 mg/dL (ref 0.0–1.2)
CO2: 21 mmol/L (ref 20–29)
Calcium: 9.7 mg/dL (ref 8.7–10.2)
Chloride: 104 mmol/L (ref 96–106)
Creatinine, Ser: 1.67 mg/dL — ABNORMAL HIGH (ref 0.76–1.27)
Globulin, Total: 2 g/dL (ref 1.5–4.5)
Glucose: 89 mg/dL (ref 65–99)
Potassium: 4.9 mmol/L (ref 3.5–5.2)
Sodium: 140 mmol/L (ref 134–144)
Total Protein: 6.5 g/dL (ref 6.0–8.5)
eGFR: 47 mL/min/{1.73_m2} — ABNORMAL LOW (ref >59)

## 2021-05-30 LAB — CBC
Hematocrit: 39.9 % (ref 37.5–51.0)
Hemoglobin: 13.9 g/dL (ref 13.0–17.7)
MCH: 32.6 pg (ref 26.6–33.0)
MCHC: 34.8 g/dL (ref 31.5–35.7)
MCV: 93 fL (ref 79–97)
Platelets: 256 10*3/uL (ref 150–450)
RBC: 4.27 x10E6/uL (ref 4.14–5.80)
RDW: 13.4 % (ref 11.6–15.4)
WBC: 5.7 10*3/uL (ref 3.4–10.8)

## 2021-05-30 LAB — HEMOGLOBIN A1C
Est. average glucose Bld gHb Est-mCnc: 100 mg/dL
Hgb A1c MFr Bld: 5.1 % (ref 4.8–5.6)

## 2021-05-30 LAB — HEPATITIS C ANTIBODY: Hep C Virus Ab: 0.2 s/co ratio (ref 0.0–0.9)

## 2021-05-30 LAB — VITAMIN D 25 HYDROXY (VIT D DEFICIENCY, FRACTURES): Vit D, 25-Hydroxy: 39.5 ng/mL (ref 30.0–100.0)

## 2021-05-30 LAB — TSH: TSH: 1.52 u[IU]/mL (ref 0.450–4.500)

## 2021-06-05 ENCOUNTER — Ambulatory Visit: Payer: BC Managed Care – PPO | Admitting: Internal Medicine

## 2021-06-05 DIAGNOSIS — M79601 Pain in right arm: Secondary | ICD-10-CM | POA: Diagnosis not present

## 2021-06-08 DIAGNOSIS — G6281 Critical illness polyneuropathy: Secondary | ICD-10-CM | POA: Diagnosis not present

## 2021-06-18 ENCOUNTER — Ambulatory Visit (HOSPITAL_COMMUNITY): Payer: BC Managed Care – PPO | Admitting: Anesthesiology

## 2021-06-18 ENCOUNTER — Other Ambulatory Visit: Payer: Self-pay

## 2021-06-18 ENCOUNTER — Ambulatory Visit (HOSPITAL_COMMUNITY)
Admission: RE | Admit: 2021-06-18 | Discharge: 2021-06-18 | Disposition: A | Discharge status: Home / Self Care | Payer: BC Managed Care – PPO | Attending: Gastroenterology | Admitting: Gastroenterology

## 2021-06-18 ENCOUNTER — Encounter (HOSPITAL_COMMUNITY): Payer: Self-pay | Admitting: Gastroenterology

## 2021-06-18 ENCOUNTER — Encounter (HOSPITAL_COMMUNITY)
Admission: RE | Disposition: A | Discharge status: Home / Self Care | Payer: Self-pay | Source: Home / Self Care | Attending: Gastroenterology

## 2021-06-18 DIAGNOSIS — D124 Benign neoplasm of descending colon: Secondary | ICD-10-CM | POA: Diagnosis not present

## 2021-06-18 DIAGNOSIS — D123 Benign neoplasm of transverse colon: Secondary | ICD-10-CM | POA: Insufficient documentation

## 2021-06-18 DIAGNOSIS — K648 Other hemorrhoids: Secondary | ICD-10-CM

## 2021-06-18 DIAGNOSIS — D631 Anemia in chronic kidney disease: Secondary | ICD-10-CM | POA: Diagnosis not present

## 2021-06-18 DIAGNOSIS — D125 Benign neoplasm of sigmoid colon: Secondary | ICD-10-CM

## 2021-06-18 DIAGNOSIS — K635 Polyp of colon: Secondary | ICD-10-CM | POA: Diagnosis not present

## 2021-06-18 DIAGNOSIS — D122 Benign neoplasm of ascending colon: Secondary | ICD-10-CM

## 2021-06-18 DIAGNOSIS — Z1211 Encounter for screening for malignant neoplasm of colon: Secondary | ICD-10-CM

## 2021-06-18 DIAGNOSIS — Z8616 Personal history of COVID-19: Secondary | ICD-10-CM | POA: Insufficient documentation

## 2021-06-18 DIAGNOSIS — Z8719 Personal history of other diseases of the digestive system: Secondary | ICD-10-CM | POA: Insufficient documentation

## 2021-06-18 DIAGNOSIS — Z87891 Personal history of nicotine dependence: Secondary | ICD-10-CM | POA: Diagnosis not present

## 2021-06-18 DIAGNOSIS — N189 Chronic kidney disease, unspecified: Secondary | ICD-10-CM | POA: Diagnosis not present

## 2021-06-18 DIAGNOSIS — Z6838 Body mass index (BMI) 38.0-38.9, adult: Secondary | ICD-10-CM | POA: Insufficient documentation

## 2021-06-18 DIAGNOSIS — Z8619 Personal history of other infectious and parasitic diseases: Secondary | ICD-10-CM | POA: Diagnosis not present

## 2021-06-18 DIAGNOSIS — K219 Gastro-esophageal reflux disease without esophagitis: Secondary | ICD-10-CM | POA: Insufficient documentation

## 2021-06-18 DIAGNOSIS — K5730 Diverticulosis of large intestine without perforation or abscess without bleeding: Secondary | ICD-10-CM | POA: Diagnosis not present

## 2021-06-18 HISTORY — PX: COLONOSCOPY WITH PROPOFOL: SHX5780

## 2021-06-18 HISTORY — PX: POLYPECTOMY: SHX149

## 2021-06-18 LAB — HM COLONOSCOPY

## 2021-06-18 SURGERY — COLONOSCOPY WITH PROPOFOL
Anesthesia: General

## 2021-06-18 MED ORDER — STERILE WATER FOR IRRIGATION IR SOLN
Status: DC | PRN
  Administered 2021-06-18: 200 mL

## 2021-06-18 MED ORDER — EPHEDRINE 5 MG/ML INJ
INTRAVENOUS | Status: AC
  Filled 2021-06-18: qty 5

## 2021-06-18 MED ORDER — PROPOFOL 10 MG/ML IV BOLUS
INTRAVENOUS | Status: DC | PRN
  Administered 2021-06-18: 60 mg via INTRAVENOUS
  Administered 2021-06-18: 30 mg via INTRAVENOUS
  Administered 2021-06-18 (×2): 20 mg via INTRAVENOUS
  Administered 2021-06-18: 40 mg via INTRAVENOUS
  Administered 2021-06-18: 50 mg via INTRAVENOUS
  Administered 2021-06-18: 30 mg via INTRAVENOUS

## 2021-06-18 MED ORDER — LACTATED RINGERS IV SOLN: INTRAVENOUS | Status: DC | PRN

## 2021-06-18 MED ORDER — PHENYLEPHRINE 40 MCG/ML (10ML) SYRINGE FOR IV PUSH (FOR BLOOD PRESSURE SUPPORT)
PREFILLED_SYRINGE | INTRAVENOUS | Status: AC
  Filled 2021-06-18: qty 10

## 2021-06-18 MED ORDER — PROPOFOL 500 MG/50ML IV EMUL
INTRAVENOUS | Status: DC | PRN
  Administered 2021-06-18: 150 ug/kg/min via INTRAVENOUS

## 2021-06-18 MED ORDER — PROPOFOL 10 MG/ML IV BOLUS
INTRAVENOUS | Status: AC
  Filled 2021-06-18: qty 80

## 2021-06-18 NOTE — H&P (Addendum)
Jackson Figueroa is an 60 y.o. male.   Chief Complaint: Colorectal cancer screening HPI: 60 year old male with past medical history of severe COVID requiring ventilatory support, GERD, obesity, coming for screening colonoscopy. The patient has never had a colonoscopy in the past.  The patient denies having any complaints such as melena, hematochezia, abdominal pain or distention, change in her bowel movement consistency or frequency, no changes in her weight recently.  No family history of colorectal cancer.   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    Tendinitis of elbow    right    Past Surgical History:  Procedure Laterality Date   APPENDECTOMY  1982   HERNIA REPAIR N/A    Phreesia 03/03/2021   INGUINAL HERNIA REPAIR Left 2000   IR GASTROSTOMY TUBE MOD SED  11/21/2020   IR GASTROSTOMY TUBE REMOVAL  01/17/2021   KNEE ARTHROSCOPY     bilateral knees   TONSILLECTOMY AND ADENOIDECTOMY     TRACHEOSTOMY TUBE PLACEMENT N/A 11/14/2020   Procedure: TRACHEOSTOMY;  Surgeon: Melida Quitter, MD;  Location: [facility];  Service: ENT;  Laterality: N/A;    Family History  Problem Relation Age of Onset   Cerebral aneurysm Mother 84   Heart disease Father    Migraines Sister    Social History:  reports that he quit smoking about 9 years ago. His smoking use included cigarettes. He smoked an average of 2 packs per day. He has never used smokeless tobacco. He reports current alcohol use of about 2.0 standard drinks of alcohol per week. He reports that he does not use  drugs.  Allergies: No Known Allergies  Medications Prior to Admission  Medication Sig Dispense Refill   acetaminophen (TYLENOL) 325 MG tablet Take 1-2 tablets (325-650 mg total) by mouth every 4 (four) hours as needed for mild pain.     Ascorbic Acid (VITAMIN C) 1000 MG tablet Take 1,000 mg by mouth daily.     Cholecalciferol (VITAMIN D3) 125 MCG (5000 UT) CAPS Take 5,000 Units by mouth daily.     diclofenac Sodium (VOLTAREN) 1 % GEL Apply 2 g topically 4 (four) times daily. (Patient taking differently: Apply 2 g topically 4 (four) times daily as needed (pain).) 350 g 0   diphenhydrAMINE (BENADRYL) 25 MG tablet Take 25 mg by mouth at bedtime.     famotidine (PEPCID) 10 MG tablet Take 10 mg by mouth daily.     Misc Natural Products (GINSENG COMPLEX PO) Take 1,000 mg by mouth daily.     Multiple Vitamin (MULTIVITAMIN WITH MINERALS) TABS tablet Take 1 tablet by mouth daily.     phenylephrine (SUDAFED PE) 10 MG TABS tablet Take 10 mg by mouth every 4 (four) hours as needed (congestion).     polyethylene glycol-electrolytes (TRILYTE) 420 g solution Take 4,000 mLs by mouth as directed. 4000 mL 0   pyridoxine (B-6) 100 MG tablet Take 100 mg by mouth daily.     traMADol (ULTRAM) 50 MG tablet  Take 1 tablet (50 mg total) by mouth every 6 (six) hours as needed for severe pain. Not sure long term med at this time 120 tablet 1   traZODone (DESYREL) 50 MG tablet Take 1 tablet (50 mg total) by mouth at bedtime. (Patient taking differently: Take 50 mg by mouth at bedtime as needed for sleep.) 30 tablet 5   zinc gluconate 50 MG tablet Take 50 mg by mouth daily.     famotidine (PEPCID) 20 MG tablet Take 1 tablet (20 mg total) by mouth 2 (two) times daily. (Patient not taking: Reported on 06/11/2021) 30 tablet 0   mirabegron ER (MYRBETRIQ) 25 MG TB24 tablet Take 1 tablet (25 mg total) by mouth at bedtime. (Patient not taking: Reported on 06/11/2021) 30 tablet 5    No results found for this or any previous visit  (from the past 48 hour(s)). No results found.  Review of Systems  Constitutional: Negative.   HENT: Negative.    Eyes: Negative.   Respiratory: Negative.    Cardiovascular: Negative.   Gastrointestinal: Negative.   Endocrine: Negative.   Genitourinary: Negative.   Musculoskeletal: Negative.   Skin: Negative.   Allergic/Immunologic: Negative.   Neurological: Negative.   Hematological: Negative.   Psychiatric/Behavioral: Negative.     Blood pressure (!) 139/93, pulse 83, temperature 98 F (36.7 C), temperature source Oral, resp. rate 19, height 6' (1.829 m), SpO2 95 %. Physical Exam  GENERAL: The patient is AO x3, in no acute distress. HEENT: Head is normocephalic and atraumatic. EOMI are intact. Mouth is well hydrated and without lesions. NECK: Supple. No masses LUNGS: Clear to auscultation. No presence of rhonchi/wheezing/rales. Adequate chest expansion HEART: RRR, normal s1 and s2. ABDOMEN: Soft, nontender, no guarding, no peritoneal signs, and nondistended. BS +. No masses. EXTREMITIES: Without any cyanosis, clubbing, rash, lesions or edema. NEUROLOGIC: AOx3, no focal motor deficit. SKIN: no jaundice, no rashes  Assessment/Plan 60 year old male with past medical history of severe COVID requiring ventilatory support, GERD, obesity, coming for screening colonoscopy. The patient is at average risk for colorectal cancer.  We will proceed with colonoscopy today.   Harvel Quale, MD 06/18/2021, 8:35 AM

## 2021-06-18 NOTE — Discharge Instructions (Signed)
You are being discharged home Restart regular diet We are waiting your pathology results Repeat colonoscopy timing will depend on pathology results

## 2021-06-18 NOTE — Anesthesia Preprocedure Evaluation (Signed)
Anesthesia Evaluation  Patient identified by MRN, date of birth, ID band Patient awake    Reviewed: Allergy & Precautions, H&P , NPO status , Patient's Chart, lab work & pertinent test results, reviewed documented beta blocker date and time   Airway Mallampati: II  TM Distance: >3 FB Neck ROM: full    Dental no notable dental hx.    Pulmonary neg pulmonary ROS, former smoker,    Pulmonary exam normal breath sounds clear to auscultation       Cardiovascular Exercise Tolerance: Good negative cardio ROS   Rhythm:regular Rate:Normal     Neuro/Psych  Neuromuscular disease negative psych ROS   GI/Hepatic Neg liver ROS, GERD  Medicated,  Endo/Other  negative endocrine ROS  Renal/GU CRFRenal disease  negative genitourinary   Musculoskeletal   Abdominal   Peds  Hematology  (+) Blood dyscrasia, anemia ,   Anesthesia Other Findings   Reproductive/Obstetrics negative OB ROS                             Anesthesia Physical Anesthesia Plan  ASA: 3  Anesthesia Plan: General   Post-op Pain Management:    Induction:   PONV Risk Score and Plan: Propofol infusion  Airway Management Planned:   Additional Equipment:   Intra-op Plan:   Post-operative Plan:   Informed Consent: I have reviewed the patients History and Physical, chart, labs and discussed the procedure including the risks, benefits and alternatives for the proposed anesthesia with the patient or authorized representative who has indicated his/her understanding and acceptance.     Dental Advisory Given  Plan Discussed with: CRNA  Anesthesia Plan Comments:         Anesthesia Quick Evaluation

## 2021-06-18 NOTE — Op Note (Addendum)
[facility] Patient Name: Jackson Figueroa Procedure Date: 06/18/2021 9:06 AM MRN: [id number] Date of Birth: 1961/09/29 Attending MD: Maylon Peppers ,  CSN: [id number] Age: 60 Admit Type: Outpatient Procedure:                Colonoscopy Indications:               Providers:                Maylon Peppers, Crystal Page, Aram Candela Referring MD:              Medicines:                Monitored Anesthesia Care Complications:            No immediate complications. Estimated Blood Loss:     Estimated blood loss: none. Procedure:                Pre-Anesthesia Assessment:                           - Prior to the procedure, a History and Physical                            was performed, and patient medications, allergies                            and sensitivities were reviewed. The patient's                            tolerance of previous anesthesia was reviewed.                           - The risks and benefits of the procedure and the                            sedation options and risks were discussed with the                            patient. All questions were answered and informed                            consent was obtained.                           - ASA Grade Assessment: II - A patient with mild                            systemic disease.                           After obtaining informed consent, the colonoscope                            was passed under direct vision. Throughout the                            procedure, the patient's blood pressure, pulse, and  oxygen saturations were monitored continuously. The                            [id number]-[id number] scope was introduced through                            the anus and advanced to the the cecum, identified                            by appendiceal orifice and ileocecal valve. The                            colonoscopy was performed without difficulty. The                             patient tolerated the procedure well. The quality                            of the bowel preparation was adequate. Scope In: 9:24:18 AM Scope Out: 9:53:52 AM Scope Withdrawal Time: 0 hours 26 minutes 26 seconds  Total Procedure Duration: 0 hours 29 minutes 34 seconds  Findings:      The perianal and digital rectal examinations were normal.      A 2 mm polyp was found in the ascending colon. The polyp was sessile.       The polyp was removed with a cold biopsy forceps. Resection and       retrieval were complete.      Six sessile polyps were found in the sigmoid colon, descending colon,       transverse colon and ascending colon. The polyps were 3 to 6 mm in size.       These polyps were removed with a cold snare. Resection and retrieval       were complete.      A few medium-mouthed diverticula were found in the sigmoid colon and       descending colon.      Non-bleeding internal hemorrhoids were found during retroflexion. The       hemorrhoids were small. Impression:               - One 2 mm polyp in the ascending colon, removed                            with a cold biopsy forceps. Resected and retrieved.                           - Six 3 to 6 mm polyps in the sigmoid colon, in the                            descending colon, in the transverse colon and in                            the ascending colon, removed with a cold snare.  Resected and retrieved.                           - Diverticulosis in the sigmoid colon and in the                            descending colon.                           - Non-bleeding internal hemorrhoids. Moderate Sedation:      Per Anesthesia Care Recommendation:           - Discharge patient to home (ambulatory).                           - Resume previous diet.                           - Await pathology results.                           - Repeat colonoscopy for surveillance based on                             pathology results. Procedure Code(s):        --- Professional ---                           [phone], Colonoscopy, flexible; with removal of                            tumor(s), polyp(s), or other lesion(s) by snare                            technique                           45380, 67, Colonoscopy, flexible; with biopsy,                            single or multiple Diagnosis Code(s):        --- Professional ---                           K63.5, Polyp of colon                           K64.8, Other hemorrhoids                           K57.30, Diverticulosis of large intestine without                            perforation or abscess without bleeding CPT copyright 2019 American Medical Association. All rights reserved. The codes documented in this report are preliminary and upon coder review may  be revised to meet current compliance requirements. Maylon Peppers, MD Maylon Peppers,  06/18/2021 11:42:55 AM This report has been signed electronically. Number of Addenda: 0

## 2021-06-18 NOTE — Transfer of Care (Signed)
Immediate Anesthesia Transfer of Care Note  Patient: Jackson Figueroa  Procedure(s) Performed: COLONOSCOPY WITH PROPOFOL POLYPECTOMY INTESTINAL  Patient Location: Endoscopy Unit  Anesthesia Type:General  Level of Consciousness: awake  Airway & Oxygen Therapy: Patient Spontanous Breathing  Post-op Assessment: Report given to RN  Post vital signs: Reviewed  Last Vitals:  Vitals Value Taken Time  BP    Temp    Pulse    Resp    SpO2      Last Pain:  Vitals:   06/18/21 0919  TempSrc:   PainSc: 3          Complications: No notable events documented.

## 2021-06-18 NOTE — Anesthesia Postprocedure Evaluation (Signed)
Anesthesia Post Note  Patient: [name]  Procedure(s) Performed: COLONOSCOPY WITH PROPOFOL POLYPECTOMY INTESTINAL  Patient location during evaluation: Endoscopy Anesthesia Type: General Level of consciousness: awake and alert Pain management: pain level controlled Vital Signs Assessment: post-procedure vital signs reviewed and stable Respiratory status: spontaneous breathing Cardiovascular status: blood pressure returned to baseline and stable Postop Assessment: no apparent nausea or vomiting Anesthetic complications: no   No notable events documented.   Last Vitals:  Vitals:   06/18/21 0812 06/18/21 0813  BP:  (!) 139/93  Pulse:  83  Resp:  19  Temp: 36.7 C   SpO2:  95%    Last Pain:  Vitals:   06/18/21 0919  TempSrc:   PainSc: 3                  Myeesha Shane

## 2021-06-19 ENCOUNTER — Telehealth: Payer: Self-pay | Admitting: Radiology

## 2021-06-19 ENCOUNTER — Ambulatory Visit: Payer: BC Managed Care – PPO

## 2021-06-19 ENCOUNTER — Encounter: Payer: Self-pay | Admitting: Orthopedic Surgery

## 2021-06-19 ENCOUNTER — Ambulatory Visit (INDEPENDENT_AMBULATORY_CARE_PROVIDER_SITE_OTHER): Payer: BC Managed Care – PPO | Admitting: Orthopedic Surgery

## 2021-06-19 VITALS — BP 151/101 | HR 79 | Ht 72.0 in | Wt 290.0 lb

## 2021-06-19 DIAGNOSIS — M1711 Unilateral primary osteoarthritis, right knee: Secondary | ICD-10-CM

## 2021-06-19 DIAGNOSIS — G8929 Other chronic pain: Secondary | ICD-10-CM

## 2021-06-19 DIAGNOSIS — M25461 Effusion, right knee: Secondary | ICD-10-CM

## 2021-06-19 DIAGNOSIS — S83511A Sprain of anterior cruciate ligament of right knee, initial encounter: Secondary | ICD-10-CM | POA: Diagnosis not present

## 2021-06-19 DIAGNOSIS — M1710 Unilateral primary osteoarthritis, unspecified knee: Secondary | ICD-10-CM

## 2021-06-19 DIAGNOSIS — M25561 Pain in right knee: Secondary | ICD-10-CM

## 2021-06-19 DIAGNOSIS — G6281 Critical illness polyneuropathy: Secondary | ICD-10-CM | POA: Diagnosis not present

## 2021-06-19 NOTE — Telephone Encounter (Signed)
Patient's imaging was denied. No physical therapy.  I called him to advise.  We do not have a valid phone number on File  I mailed him a letter.

## 2021-06-19 NOTE — Patient Instructions (Addendum)
APPLY ICE AS NEEDED FOR SWELLING   FEEL FREE TO TAKE THE TRAMADOL  While we are working on your approval for MRI please go ahead and call to schedule your appointment with [facility] within at least one (1) week.   Central Scheduling [phone]

## 2021-06-19 NOTE — Progress Notes (Signed)
Established patient new problem   Chief Complaint  Patient presents with   Knee Pain    Right knee painful/ s 3 weeks left knee feels better     This 60 year old male was seen for his left knee and did well however he says the last 3 weeks if he has had increased pain in his right knee.  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    Tendinitis of elbow    right    Physical Exam  Constitutional: Development normal, nutrition normal, body habitus Body mass index is 39.33 kg/m.  Mental status oriented x3 mood and affect no depression Cardiovascular pulses and temperature normal   Gait LABORED WITH HYPEREXT RIGHT KNEE   Musculoskeletal:  Inspection: EFFUSION RIGHT KNEE  Range of motion: +10 - 125  Stability: LAXITY AP PLANE  Muscle strength and tone: NORMAL   Skin warm dry and intact no erythema  Neurological sensation normal  Assessment and plan:  RULE OUT  Encounter Diagnoses  Name Primary?   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

## 2021-06-20 LAB — SURGICAL PATHOLOGY

## 2021-06-23 ENCOUNTER — Encounter (INDEPENDENT_AMBULATORY_CARE_PROVIDER_SITE_OTHER): Payer: Self-pay | Admitting: *Deleted

## 2021-06-23 ENCOUNTER — Encounter (HOSPITAL_COMMUNITY): Payer: Self-pay | Admitting: Gastroenterology

## 2021-06-26 ENCOUNTER — Telehealth: Payer: Self-pay | Admitting: Orthopedic Surgery

## 2021-06-26 ENCOUNTER — Other Ambulatory Visit: Payer: Self-pay | Admitting: Radiology

## 2021-06-26 DIAGNOSIS — M25522 Pain in left elbow: Secondary | ICD-10-CM | POA: Diagnosis not present

## 2021-06-26 DIAGNOSIS — M170 Bilateral primary osteoarthritis of knee: Secondary | ICD-10-CM

## 2021-06-26 DIAGNOSIS — M1710 Unilateral primary osteoarthritis, unspecified knee: Secondary | ICD-10-CM

## 2021-06-26 NOTE — Progress Notes (Signed)
Sent order for therapy patient returned call about MRI denial and is aware he needs therapy and then a follow up to reorder the MRI

## 2021-06-26 NOTE — Telephone Encounter (Signed)
Amy,   Patient called and left a message stating you (Amy) sent him a letter, you must not have his phone number it is [phone].    Please call him back.

## 2021-06-26 NOTE — Telephone Encounter (Signed)
Carol sent me a message in the referral, she spoke to patient, he got my letter aware he needs therapy and the MRI can not be approved until after he completes some therapy. She told him we will send order, and he can make appointment after he goes for therapy to try again for the MRI   Therapy order sent to [facility]

## 2021-06-30 ENCOUNTER — Telehealth: Payer: Self-pay | Admitting: Orthopedic Surgery

## 2021-06-30 NOTE — Telephone Encounter (Signed)
Patient called and left voicemail for Amy to call him back he has questions about his MRI and wants to make sure insurance is going to cover it.  Please call him back at [phone].  He said you can also leave a message he just wants to know about his MRI and he would like a call back as soon as possible.

## 2021-06-30 NOTE — Telephone Encounter (Signed)
I called him, to advise insurance will not cover MRI until after he has therapy. He has it scheduled he stats he has appointment for Thursday, wants injection. Told him that's fine, to come in and discuss with Dr Lemmie Evens

## 2021-06-30 NOTE — Telephone Encounter (Signed)
No insurance will not cover. I have already tried will try again

## 2021-07-01 ENCOUNTER — Ambulatory Visit (HOSPITAL_COMMUNITY): Payer: BC Managed Care – PPO

## 2021-07-03 ENCOUNTER — Other Ambulatory Visit: Payer: Self-pay

## 2021-07-03 ENCOUNTER — Encounter: Payer: Self-pay | Admitting: Orthopedic Surgery

## 2021-07-03 ENCOUNTER — Ambulatory Visit (INDEPENDENT_AMBULATORY_CARE_PROVIDER_SITE_OTHER): Payer: BC Managed Care – PPO | Admitting: Orthopedic Surgery

## 2021-07-03 VITALS — BP 158/97 | HR 107 | Ht 72.0 in | Wt 296.0 lb

## 2021-07-03 DIAGNOSIS — M25461 Effusion, right knee: Secondary | ICD-10-CM | POA: Diagnosis not present

## 2021-07-03 DIAGNOSIS — M1710 Unilateral primary osteoarthritis, unspecified knee: Secondary | ICD-10-CM

## 2021-07-03 DIAGNOSIS — M1711 Unilateral primary osteoarthritis, right knee: Secondary | ICD-10-CM

## 2021-07-03 NOTE — Progress Notes (Signed)
Chief Complaint  Patient presents with   Knee Pain    Rt knee pain, MRI denied and would like an injection for upcoming vacation.    Encounter Diagnoses  Name Primary?   Primary localized osteoarthritis of knee Yes   Effusion, right knee    60 year old male with arthritis effusion and giving way right knee sent for MRI but MRI was denied patient cannot go to physical therapy however he is going to [place] would like an injection in the right knee  Procedure note  Injection  Verbal consent was obtained to inject the right knee  Timeout procedure was completed to confirm injection site  Diagnosis arthritis chronic pain effusion  Medications used Celestone Lidocaine 1% plain 3 cc  Anesthesia was provided by ethyl chloride spray  Prep was performed with alcohol  Technique of injection knee flexed 90 degrees lateral portal approach  No complications were noted

## 2021-07-08 ENCOUNTER — Ambulatory Visit: Payer: BC Managed Care – PPO | Admitting: Internal Medicine

## 2021-07-09 DIAGNOSIS — G6281 Critical illness polyneuropathy: Secondary | ICD-10-CM | POA: Diagnosis not present

## 2021-07-14 ENCOUNTER — Telehealth (HOSPITAL_COMMUNITY): Payer: Self-pay | Admitting: Physical Therapy

## 2021-07-14 ENCOUNTER — Encounter (HOSPITAL_COMMUNITY): Payer: Self-pay | Admitting: Physical Therapy

## 2021-07-14 ENCOUNTER — Ambulatory Visit (HOSPITAL_COMMUNITY): Payer: BC Managed Care – PPO | Attending: Orthopedic Surgery | Admitting: Physical Therapy

## 2021-07-14 ENCOUNTER — Other Ambulatory Visit: Payer: Self-pay

## 2021-07-14 DIAGNOSIS — M25561 Pain in right knee: Secondary | ICD-10-CM | POA: Diagnosis not present

## 2021-07-14 DIAGNOSIS — G8929 Other chronic pain: Secondary | ICD-10-CM | POA: Diagnosis not present

## 2021-07-14 DIAGNOSIS — M6281 Muscle weakness (generalized): Secondary | ICD-10-CM | POA: Insufficient documentation

## 2021-07-14 NOTE — Telephone Encounter (Signed)
Gwinda Passe will only be seeing [facility] on this date 08/01/21 -cx and put on waitlist . Called pt his number was out of service- added note to [facility] to let him know schedule change on next visit .

## 2021-07-14 NOTE — Therapy (Signed)
[place], [place], [address] Phone: [phone]   Fax:  [phone]  Physical Therapy Evaluation  Patient Details  Name: Jackson Figueroa MRN: [id number] Date of Birth: December 17, 1960 Referring Provider (PT): Arther Abbott   Encounter Date: 07/14/2021   PT End of Session - 07/14/21 1218     Visit Number 1    Number of Visits 8    Date for PT Re-Evaluation 08/13/21    Authorization Type BCBS    Authorization - Visit Number 1    Authorization - Number of Visits 8    Progress Note Due on Visit 8    PT Start Time [id number]    PT Stop Time 1130    PT Time Calculation (min) 45 min    Activity Tolerance Patient tolerated treatment well    Behavior During Therapy [facility] for tasks assessed/performed             Past Medical History:  Diagnosis Date   Acute respiratory distress syndrome (ARDS) due to COVID-19 virus [facility])    Allergic    Dysphagia    GERD (gastroesophageal reflux disease)    Phreesia 03/03/2021   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    Tendinitis of elbow    right    Past Surgical History:  Procedure Laterality Date   APPENDECTOMY  1982   COLONOSCOPY WITH PROPOFOL N/A 06/18/2021   Procedure: COLONOSCOPY WITH PROPOFOL;  Surgeon: Harvel Quale, MD;  Location: [facility];  Service: Gastroenterology;  Laterality: N/A;  9:15   HERNIA REPAIR N/A    Phreesia 03/03/2021   INGUINAL HERNIA REPAIR Left 2000   IR GASTROSTOMY TUBE MOD SED  11/21/2020   IR GASTROSTOMY TUBE REMOVAL  01/17/2021   KNEE ARTHROSCOPY     bilateral knees   POLYPECTOMY  06/18/2021   Procedure: POLYPECTOMY INTESTINAL;  Surgeon: Harvel Quale, MD;  Location: [facility];  Service:  Gastroenterology;;   TONSILLECTOMY AND ADENOIDECTOMY     TRACHEOSTOMY TUBE PLACEMENT N/A 11/14/2020   Procedure: TRACHEOSTOMY;  Surgeon: Melida Quitter, MD;  Location: [facility];  Service: ENT;  Laterality: N/A;    There were no vitals filed for this visit.    Subjective Assessment - 07/14/21 1059     Subjective Jackson Figueroa states that he had Baker cysts through puberty, he had arthroscopic surgery on both knees in the past for torn meniscus.  He has had knee pain off and on throughout his life.  He had Covid last Thanksgiving and was intubated for 5 days and became very weak.  He was in inpatient rehab for several weeks and had a controlled fall going down the steps.  At this time he was hurting more on the Lt so they concentrated on the Rt.  He has not done any exercises at this time he has just been waiting for an MRI but his therapy said that he needed to complete therapy first.    Pertinent History chronic knee pain B, Covid    Limitations Lifting;Standing;Walking;House hold activities    How long can you sit comfortably? no problem    How long can you stand comfortably? 30 minutes  How long can you walk comfortably? 30 minutes    Patient Stated Goals be stronger.    Currently in Pain? No/denies   highest pain in the past week 8   Pain Location Knee    Pain Orientation Right;Lateral    Pain Descriptors / Indicators Aching    Pain Type Chronic pain    Pain Onset More than a month ago    Pain Frequency Intermittent    Aggravating Factors  walking/standing, steps    Pain Relieving Factors ice, meds ,    Effect of Pain on Daily Activities limits                [facility] PT Assessment - 07/14/21 0001       Assessment   Medical Diagnosis Rt knee pain    Referring Provider (PT) Arther Abbott    Onset Date/Surgical Date 03/23/21    Next MD Visit 07/19/2021    Prior Therapy IP rehab, [facility]      Precautions   Precautions Fall      Balance Screen   Has the patient fallen in the  past 6 months --   controled descent in In patient rehab.   Has the patient had a decrease in activity level because of a fear of falling?  Yes    Is the patient reluctant to leave their home because of a fear of falling?  No      Home Ecologist residence    Home Access Stairs to enter    Entrance Stairs-Number of Steps [place] Other (Comment)   3 stories     Prior Function   Level of Independence Independent      Cognition   Overall Cognitive Status Within Functional Limits for tasks assessed      Observation/Other Assessments   Focus on Therapeutic Outcomes (FOTO)  49; 51% affected      Functional Tests   Functional tests Single leg stance;Sit to Stand      Single Leg Stance   Comments RT 2"; Lt 5"      Sit to Stand   Comments 6 in 30 seconds      ROM / Strength   AROM / PROM / Strength Strength;AROM      AROM   AROM Assessment Site --   normal     Strength   Strength Assessment Site Knee;Hip    Right/Left Hip Right;Left    Right Hip Flexion 4+/5    Right Hip Extension 3+/5    Right Hip ABduction 4/5    Left Hip Flexion 5/5    Left Hip Extension 3+/5    Left Hip ABduction 5/5    Right/Left Knee Right;Left    Right Knee Flexion 4/5    Right Knee Extension 3+/5    Left Knee Flexion 5/5    Left Knee Extension 5/5                        Objective measurements completed on examination: See above findings.       [facility] Adult PT Treatment/Exercise - 07/14/21 0001       Exercises   Exercises Knee/Hip      Knee/Hip Exercises: Standing   Functional Squat 10 reps    SLS x2 B      Knee/Hip Exercises: Seated   Long Arc Quad Right;10 reps   hold  5 seconds   Sit to [employer]  10 reps      Knee/Hip Exercises: Prone   Hip Extension Both;10 reps                    PT Education - 07/14/21 1217     Education Details HEP the importance of policing yourself as pt has had IP rehab, [facility] and is currently  not doing any of the exercises given to him    Person(s) Educated Patient    Methods Explanation;Handout    Comprehension Verbalized understanding;Returned demonstration              PT Short Term Goals - 07/14/21 1226       PT SHORT TERM GOAL #1   Title PT to be I in HEP to improve LE strength to allow pt to be able to come sit to stand from lower chairs/car without difficulty .    Time 2    Period Weeks    Status New    Target Date 07/28/21      PT SHORT TERM GOAL #2   Title PT to core and LE strength to be increased to allow pt to be able to come sit to stand 11 x in 30 seconds    Time 2    Period Weeks    Status New               PT Long Term Goals - 07/14/21 1228       PT LONG TERM GOAL #1   Title PT B  LE strength to be 4/5 to be able to go up and down 10 steps in a reciprocal manner using 1 hand hold on handrail    Time 4    Period Weeks    Status New    Target Date 08/11/21      PT LONG TERM GOAL #2   Title PT pain in RT LE to be no greater than a 4/10 throughout the day in order to tolerate daily activities    Time 4    Period Weeks    Status New      PT LONG TERM GOAL #3   Title PT to be able to single leg stance for at least 10 seconds B in order to reduce risk of falling    Time 4    Period Weeks    Status New                    Plan - 07/14/21 1220     Clinical Impression Statement Pt is a 60 yo male who has had chronic issues with his knees.  He had covid in November of 2021 and needed to be intubated for several days.  He has never totally recovered his strength from this episode and as a result his right knee began to flare up.  He is currently being referred to skilled PT for evaluation and treatment.  Evaluation demonstrates decreased mm strength, decreased balance and decreased functional use of his Rt knee. Jackson Figueroa will benefit from skilled PT to address this and maximize his functional ability    Personal Factors and  Comorbidities Time since onset of injury/illness/exacerbation;Fitness    Examination-Activity Limitations Carry;Lift;Locomotion Level;Stairs;Stand;Squat    Examination-Participation Restrictions Cleaning;Shop;Yard Work    Merchant navy officer Stable/Uncomplicated    Clinical Decision Making Moderate    Rehab Potential Fair    PT Frequency 2x / week    PT Duration 4 weeks    PT Treatment/Interventions  Therapeutic exercise;Balance training;Neuromuscular re-education;Gait training;Stair training;Therapeutic activities;Manual techniques    PT Next Visit Plan Continue with functional strengthening of Rt LE to include heel raises, Step ups, lunges, sidesteps.    PT Home Exercise Plan sit to stand, functional squat, prone SLR B             Patient will benefit from skilled therapeutic intervention in order to improve the following deficits and impairments:  Abnormal gait, Decreased activity tolerance, Decreased balance, Difficulty walking, Decreased strength, Pain, Obesity  Visit Diagnosis: Chronic pain of right knee - Plan: PT plan of care cert/re-cert  Muscle weakness (generalized) - Plan: PT plan of care cert/re-cert     Problem List Patient Active Problem List   Diagnosis Date Noted   Urinary incontinence 03/07/2021   Encounter to establish care 03/07/2021   Impaired gait 03/05/2021   Recurrent pain of both knees 03/05/2021   Anemia of chronic illness 01/24/2021   Insomnia 01/24/2021   Acute meniscal tear of left knee 01/24/2021   Chronic kidney disease 01/22/2021   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

## 2021-07-15 ENCOUNTER — Ambulatory Visit (INDEPENDENT_AMBULATORY_CARE_PROVIDER_SITE_OTHER): Payer: BC Managed Care – PPO | Admitting: Internal Medicine

## 2021-07-15 ENCOUNTER — Encounter: Payer: Self-pay | Admitting: Internal Medicine

## 2021-07-15 VITALS — BP 150/90 | HR 72 | Temp 97.8°F | Resp 20 | Ht 72.0 in | Wt 296.0 lb

## 2021-07-15 DIAGNOSIS — Z0001 Encounter for general adult medical examination with abnormal findings: Secondary | ICD-10-CM

## 2021-07-15 DIAGNOSIS — Z0000 Encounter for general adult medical examination without abnormal findings: Secondary | ICD-10-CM

## 2021-07-15 DIAGNOSIS — G7281 Critical illness myopathy: Secondary | ICD-10-CM

## 2021-07-15 DIAGNOSIS — R269 Unspecified abnormalities of gait and mobility: Secondary | ICD-10-CM

## 2021-07-15 DIAGNOSIS — N1831 Chronic kidney disease, stage 3a: Secondary | ICD-10-CM

## 2021-07-15 DIAGNOSIS — I10 Essential (primary) hypertension: Secondary | ICD-10-CM

## 2021-07-15 DIAGNOSIS — Z125 Encounter for screening for malignant neoplasm of prostate: Secondary | ICD-10-CM

## 2021-07-15 DIAGNOSIS — E782 Mixed hyperlipidemia: Secondary | ICD-10-CM

## 2021-07-15 MED ORDER — LISINOPRIL 5 MG PO TABS: 5.0000 mg | ORAL_TABLET | Freq: Every day | ORAL | 0 refills | Status: DC

## 2021-07-15 NOTE — Assessment & Plan Note (Addendum)
BP Readings from Last 1 Encounters:  07/15/21 (!) 150/90   New-onset Recent office visits with other providers also show consistently elevated BP Started Lisinopril 5 mg QD Counseled for compliance with the medications Advised DASH diet and moderate exercise/walking, at least 150 mins/week

## 2021-07-15 NOTE — Assessment & Plan Note (Signed)
Prolonged hospitalization and rehab treatment Follows up with PMNR On Tramadol PRN for severe pain

## 2021-07-15 NOTE — Assessment & Plan Note (Addendum)
Lipid profile reviewed Advised to follow DASH diet 

## 2021-07-15 NOTE — Assessment & Plan Note (Signed)
Due to critical illness myopathy - now better Continue PT

## 2021-07-15 NOTE — Progress Notes (Signed)
 Established Patient Office Visit  Subjective:  Patient ID: Jackson Figueroa, male    DOB: 06/26/1961  Age: 60 y.o. MRN: [id number]  CC:  Chief Complaint  Patient presents with   Hypertension    HPI Jackson Figueroa is a 60-year old male with past medical history of prolonged hospitalization and rehab placement due to Covid infection who presents for follow up of his chronic medical conditions.  HTN: BP was elevated on multiple measurements today. His BP has been elevated at other providers' offices as well recently. Denies any headache, dizziness, chest pain, dyspnea or palpitations.  CKD stage 3: His S. Cr. and BUN have been rising recently. Denies any urinary hesitancy, resistance, dysuria or hematuria.  He has had carpal tunnel repair and also has been seeing Dr Harrison for knee effusion. He is still undergoing PT. Does not need walker now.  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    Tendinitis of elbow    right   Urinary incontinence 03/07/2021    Past Surgical History:  Procedure Laterality Date   APPENDECTOMY  1982   COLONOSCOPY WITH PROPOFOL N/A 06/18/2021   Procedure: COLONOSCOPY WITH PROPOFOL;  Surgeon: Castaneda Mayorga, Daniel, MD;  [facility];  Service: Gastroenterology;  Laterality: N/A;  9:15   HERNIA REPAIR N/A    Phreesia 03/03/2021   INGUINAL HERNIA REPAIR Left 2000   IR GASTROSTOMY TUBE MOD SED  11/21/2020   IR GASTROSTOMY TUBE REMOVAL  01/17/2021   KNEE ARTHROSCOPY     bilateral knees   POLYPECTOMY  06/18/2021   Procedure:  POLYPECTOMY INTESTINAL;  Surgeon: Castaneda Mayorga, Daniel, MD;  [facility];  Service: Gastroenterology;;   TONSILLECTOMY AND ADENOIDECTOMY     TRACHEOSTOMY TUBE PLACEMENT N/A 11/14/2020   Procedure: TRACHEOSTOMY;  Surgeon: Bates, Dwight, MD;  Location: [facility];  Service: ENT;  Laterality: N/A;    Family History  Problem Relation Age of Onset   Cerebral aneurysm Mother 38   Heart disease Father    Migraines Sister     Social History   Socioeconomic History   Marital status: Significant Other    Spouse name: Not on file   Number of children: Not on file   Years of education: Not on file   Highest education level: Not on file  Occupational History   Not on file  Tobacco Use   Smoking status: Former    Packs/day: 2.00    Types: Cigarettes    Quit date: 03/23/2012    Years since quitting: 9.3   Smokeless tobacco: Never  Vaping Use   Vaping Use: Never used  Substance and Sexual Activity   Alcohol use: Yes    Alcohol/week: 2.0 standard drinks    Types: 2 Glasses of wine per week    Comment: occasional wine   Drug use: Never   Sexual activity: Not on file  Other Topics Concern   Not on file  Social History Narrative   Not on   file   Social Determinants of Health   Financial Resource Strain: Not on file  Food Insecurity: Not on file  Transportation Needs: Not on file  Physical Activity: Not on file  Stress: Not on file  Social Connections: Not on file  Intimate Partner Violence: Not on file    Outpatient Medications Prior to Visit  Medication Sig Dispense Refill   acetaminophen (TYLENOL) 325 MG tablet Take 1-2 tablets (325-650 mg total) by mouth every 4 (four) hours as needed for mild pain.     Ascorbic Acid (VITAMIN C) 1000 MG tablet Take 1,000 mg by mouth daily.     Cholecalciferol (VITAMIN D3) 125 MCG (5000 UT) CAPS Take 5,000 Units by mouth daily.     diclofenac Sodium (VOLTAREN) 1 % GEL Apply 2 g topically 4 (four) times daily. (Patient taking  differently: Apply 2 g topically 4 (four) times daily as needed (pain).) 350 g 0   diphenhydrAMINE (BENADRYL) 25 MG tablet Take 25 mg by mouth at bedtime.     famotidine (PEPCID) 10 MG tablet Take 10 mg by mouth daily.     Misc Natural Products (GINSENG COMPLEX PO) Take 1,000 mg by mouth daily.     Multiple Vitamin (MULTIVITAMIN WITH MINERALS) TABS tablet Take 1 tablet by mouth daily.     phenylephrine (SUDAFED PE) 10 MG TABS tablet Take 10 mg by mouth every 4 (four) hours as needed (congestion).     pyridoxine (B-6) 100 MG tablet Take 100 mg by mouth daily.     zinc gluconate 50 MG tablet Take 50 mg by mouth daily.     oxyCODONE-acetaminophen (PERCOCET) 10-325 MG tablet Take 1 tablet by mouth every 6 (six) hours as needed. (Patient not taking: Reported on 07/15/2021)     traMADol (ULTRAM) 50 MG tablet Take 1 tablet (50 mg total) by mouth every 6 (six) hours as needed for severe pain. Not sure long term med at this time (Patient not taking: Reported on 07/15/2021) 120 tablet 1   traZODone (DESYREL) 50 MG tablet Take 1 tablet (50 mg total) by mouth at bedtime. (Patient not taking: Reported on 07/15/2021) 30 tablet 5   No facility-administered medications prior to visit.    No Known Allergies  ROS Review of Systems  Constitutional:  Negative for chills and fever.  HENT:  Negative for congestion and sore throat.   Eyes:  Negative for pain and discharge.  Respiratory:  Negative for cough and shortness of breath.   Cardiovascular:  Negative for chest pain and palpitations.  Gastrointestinal:  Negative for constipation, diarrhea, nausea and vomiting.  Endocrine: Negative for polydipsia and polyuria.  Genitourinary:  Negative for dysuria and hematuria.  Musculoskeletal:  Positive for arthralgias, back pain and gait problem. Negative for neck pain and neck stiffness.  Skin:  Negative for rash.  Neurological:  Negative for dizziness, weakness, numbness and headaches.  Psychiatric/Behavioral:   Negative for agitation and behavioral problems.      Objective:    Physical Exam Vitals reviewed.  Constitutional:      General: He is not in acute distress.    Appearance: He is not diaphoretic.  HENT:     Head: Normocephalic and atraumatic.     Nose: Nose normal.     Mouth/Throat:     Mouth: Mucous membranes are moist.  Eyes:     General: No scleral icterus.    Extraocular Movements: Extraocular movements intact.  Cardiovascular:     Rate and Rhythm: Normal rate and regular  rhythm.     Pulses: Normal pulses.     Heart sounds: Normal heart sounds. No murmur heard. Pulmonary:     Breath sounds: Normal breath sounds. No wheezing or rales.  Abdominal:     Palpations: Abdomen is soft.     Tenderness: There is no abdominal tenderness.  Musculoskeletal:     Cervical back: Neck supple. No tenderness.     Right lower leg: No edema.     Left lower leg: No edema.  Skin:    General: Skin is warm.     Findings: No rash.  Neurological:     General: No focal deficit present.     Mental Status: He is alert and oriented to person, place, and time.     Sensory: No sensory deficit.     Motor: Weakness (4/5 in b/l LE) present.  Psychiatric:        Mood and Affect: Mood normal.        Behavior: Behavior normal.    BP (!) 150/90 (BP Location: Left Arm, Cuff Size: Normal)   Pulse 72   Temp 97.8 F (36.6 C)   Resp 20   Ht 6' (1.829 m)   Wt 296 lb (134.3 kg)   SpO2 95%   BMI 40.14 kg/m  Wt Readings from Last 3 Encounters:  07/15/21 296 lb (134.3 kg)  07/03/21 296 lb (134.3 kg)  06/19/21 290 lb (131.5 kg)     Health Maintenance Due  Topic Date Due   Zoster Vaccines- Shingrix (1 of 2) Never done   COVID-19 Vaccine (2 - Pfizer series) 01/31/2021   INFLUENZA VACCINE  06/23/2021    There are no preventive care reminders to display for this patient.  Lab Results  Component Value Date   TSH 1.520 05/29/2021   Lab Results  Component Value Date   WBC 5.7 05/29/2021   HGB  13.9 05/29/2021   HCT 39.9 05/29/2021   MCV 93 05/29/2021   PLT 256 05/29/2021   Lab Results  Component Value Date   NA 140 05/29/2021   K 4.9 05/29/2021   CO2 21 05/29/2021   GLUCOSE 89 05/29/2021   BUN 28 (H) 05/29/2021   CREATININE 1.67 (H) 05/29/2021   BILITOT 0.4 05/29/2021   ALKPHOS 61 05/29/2021   AST 18 05/29/2021   ALT 18 05/29/2021   PROT 6.5 05/29/2021   ALBUMIN 4.5 05/29/2021   CALCIUM 9.7 05/29/2021   ANIONGAP 10 01/29/2021   EGFR 47 (L) 05/29/2021   Lab Results  Component Value Date   CHOL 191 05/29/2021   Lab Results  Component Value Date   HDL 45 05/29/2021   Lab Results  Component Value Date   LDLCALC 127 (H) 05/29/2021   Lab Results  Component Value Date   TRIG 103 05/29/2021   Lab Results  Component Value Date   CHOLHDL 4.2 05/29/2021   Lab Results  Component Value Date   HGBA1C 5.1 05/29/2021      Assessment & Plan:   Problem List Items Addressed This Visit       Cardiovascular and Mediastinum   Essential hypertension - Primary    BP Readings from Last 1 Encounters:  07/15/21 (!) 150/90  New-onset Recent office visits with other providers also show consistently elevated BP Started Lisinopril 5 mg QD Counseled for compliance with the medications Advised DASH diet and moderate exercise/walking, at least 150 mins/week       Relevant Medications   lisinopril (ZESTRIL) 5 MG tablet       Genitourinary   Stage 3a chronic kidney disease ([place])    Recent rise in S. Cr. Could be related to acute stress and dehydration related AKI Check CMP in the next visit Avoid nephrotoxic agents Started Lisinopril for HTN. Check UA in the next visit        Other   Critical illness myopathy    Prolonged hospitalization and rehab treatment Follows up with PMNR On Tramadol PRN for severe pain      Impaired gait    Due to critical illness myopathy - now better Continue PT      Mixed hyperlipidemia    Lipid profile reviewed Advised to  follow DASH diet      Relevant Medications   lisinopril (ZESTRIL) 5 MG tablet   Prostate cancer screening    Ordered PSA after discussing limitations of PSA screening including false positive result leading to additional testing.      Relevant Orders   PSA   Meds ordered this encounter  Medications   lisinopril (ZESTRIL) 5 MG tablet    Sig: Take 1 tablet (5 mg total) by mouth daily.    Dispense:  90 tablet    Refill:  0    Follow-up: Return in about 3 months (around 10/15/2021) for Annual physical.    Lindell Spar, MD

## 2021-07-15 NOTE — Assessment & Plan Note (Signed)
Ordered PSA after discussing limitations of PSA screening including false positive result leading to additional testing.

## 2021-07-15 NOTE — Patient Instructions (Signed)
Please start taking Lisinopril 5 mg once daily.  Please follow low salt diet and participate in physical therapy as scheduled.  Please get fasting blood tests done before the next visit.

## 2021-07-15 NOTE — Assessment & Plan Note (Signed)
Recent rise in S. Cr. Could be related to acute stress and dehydration related AKI Check CMP in the next visit Avoid nephrotoxic agents Started Lisinopril for HTN. Check UA in the next visit

## 2021-07-17 ENCOUNTER — Telehealth (HOSPITAL_COMMUNITY): Payer: Self-pay | Admitting: Physical Therapy

## 2021-07-17 ENCOUNTER — Other Ambulatory Visit: Payer: Self-pay

## 2021-07-17 ENCOUNTER — Encounter: Payer: Self-pay | Admitting: Orthopedic Surgery

## 2021-07-17 ENCOUNTER — Ambulatory Visit (INDEPENDENT_AMBULATORY_CARE_PROVIDER_SITE_OTHER): Payer: BC Managed Care – PPO | Admitting: Orthopedic Surgery

## 2021-07-17 VITALS — Ht 72.0 in | Wt 296.0 lb

## 2021-07-17 DIAGNOSIS — G8929 Other chronic pain: Secondary | ICD-10-CM | POA: Diagnosis not present

## 2021-07-17 DIAGNOSIS — M1711 Unilateral primary osteoarthritis, right knee: Secondary | ICD-10-CM

## 2021-07-17 DIAGNOSIS — S83511A Sprain of anterior cruciate ligament of right knee, initial encounter: Secondary | ICD-10-CM | POA: Diagnosis not present

## 2021-07-17 DIAGNOSIS — M25461 Effusion, right knee: Secondary | ICD-10-CM

## 2021-07-17 DIAGNOSIS — M1710 Unilateral primary osteoarthritis, unspecified knee: Secondary | ICD-10-CM

## 2021-07-17 NOTE — Progress Notes (Signed)
FOLLOW UP   Encounter Diagnoses  Name Primary?   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      Jackson Figueroa is 60 years old he has been scheduled for an MRI but it was denied because of requirements of physical therapy which she has started  He has had an injection in the knee as well  It seems that he is still having symptoms although he is somewhat better after the injection  We are going to move his MRI appointment to allow [place] to get precertification again

## 2021-07-18 ENCOUNTER — Encounter
Payer: BC Managed Care – PPO | Attending: Physical Medicine and Rehabilitation | Admitting: Physical Medicine and Rehabilitation

## 2021-07-18 ENCOUNTER — Other Ambulatory Visit: Payer: Self-pay

## 2021-07-18 ENCOUNTER — Ambulatory Visit (HOSPITAL_COMMUNITY): Payer: BC Managed Care – PPO | Admitting: Physical Therapy

## 2021-07-18 ENCOUNTER — Encounter: Payer: Self-pay | Admitting: Physical Medicine and Rehabilitation

## 2021-07-18 ENCOUNTER — Encounter (HOSPITAL_COMMUNITY): Payer: Self-pay | Admitting: Physical Therapy

## 2021-07-18 VITALS — BP 112/86 | HR 91 | Temp 98.4°F | Ht 72.0 in | Wt 294.0 lb

## 2021-07-18 DIAGNOSIS — G894 Chronic pain syndrome: Secondary | ICD-10-CM | POA: Diagnosis not present

## 2021-07-18 DIAGNOSIS — M6281 Muscle weakness (generalized): Secondary | ICD-10-CM

## 2021-07-18 DIAGNOSIS — M25561 Pain in right knee: Secondary | ICD-10-CM | POA: Diagnosis not present

## 2021-07-18 DIAGNOSIS — G5621 Lesion of ulnar nerve, right upper limb: Secondary | ICD-10-CM

## 2021-07-18 DIAGNOSIS — G7281 Critical illness myopathy: Secondary | ICD-10-CM | POA: Diagnosis not present

## 2021-07-18 DIAGNOSIS — G8929 Other chronic pain: Secondary | ICD-10-CM

## 2021-07-18 MED ORDER — CAPSAICIN 0.025 % EX LOTN: 1.0000 "application " | TOPICAL_LOTION | Freq: Every day | CUTANEOUS | 1 refills | Status: DC

## 2021-07-18 NOTE — Patient Instructions (Addendum)
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  Sees Hand surgeon 9/1 again- need to discuss prognosis.  And when can go back to work-   2. Doesn't see the need to continue Tramadol since doesn't really help- does have a few Percocet left when things get bad.    3. Still has LE weakness due to ICU myopathy and long COVID- esp RLE>LLE- also told his glutes are also weak- esp notable with squats.   4. Suggest picking 3-4 exercises and doing home exercise plan DAILY! Of some kind.   5. Doesn't know if can work remotely- and still doing knee surgery-  Finishing therapy on Friday 9/23- so will look to get back to work 08/18/21.   6. Still not getting long term disability.  Unable to do his job at this time- and since unable to work remotely or part-time, will need to keep pt out of work until 9/25- can return 08/18/21-  however if things change, might have push out a little further.   7. Will try Capscain lotion 0.025% apply daily- first three days, will burn? - try nightly- but help deplete the neurotransmitters by day 4- so pain improves.    8. F/U in 3 months-

## 2021-07-18 NOTE — Therapy (Signed)
Jackson Figuero[place], [place], [address] Phone: [phone]   Fax:  [phone]  Physical Therapy Treatment  Patient Details  Name: Jackson Figueroa MRN: [id number] Date of Birth: 1961/08/21 Referring Provider (PT): Arther Abbott   Encounter Date: 07/18/2021   PT End of Session - 07/18/21 0824     Visit Number 2    Number of Visits 8    Date for PT Re-Evaluation 08/13/21    Authorization Type BCBS    Authorization - Visit Number 2    Authorization - Number of Visits 8    Progress Note Due on Visit 8    PT Start Time 0830    PT Stop Time 0910    PT Time Calculation (min) 40 min    Activity Tolerance Patient tolerated treatment well    Behavior During Therapy [facility] for tasks assessed/performed             Past Medical History:  Diagnosis Date   Acute respiratory distress syndrome (ARDS) due to COVID-19 virus 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    Tendinitis of elbow    right   Urinary incontinence 03/07/2021    Past Surgical History:  Procedure Laterality Date   APPENDECTOMY  1982   COLONOSCOPY WITH PROPOFOL N/A 06/18/2021   Procedure: COLONOSCOPY WITH PROPOFOL;  Surgeon: Harvel Quale, MD;  [facility];  Service: Gastroenterology;  Laterality: N/A;  9:15   HERNIA REPAIR N/A    Phreesia 03/03/2021   INGUINAL HERNIA REPAIR Left 2000   IR GASTROSTOMY TUBE MOD SED  11/21/2020   IR GASTROSTOMY TUBE REMOVAL  01/17/2021   KNEE ARTHROSCOPY     bilateral knees   POLYPECTOMY  06/18/2021   Procedure: POLYPECTOMY INTESTINAL;  Surgeon: Harvel Quale, MD;  [facility];  Service: Gastroenterology;;   TONSILLECTOMY AND ADENOIDECTOMY     TRACHEOSTOMY TUBE PLACEMENT N/A 11/14/2020   Procedure: TRACHEOSTOMY;  Surgeon: Melida Quitter, MD;  Location: [facility];  Service: ENT;  Laterality: N/A;    There were no vitals filed for this visit.   Subjective Assessment - 07/18/21 0833     Subjective States that he feels slightly better in strength and was able to do a couple hours out In the yard the other day. With standing he has 5/10 pain in his knee. Exercises are still not going as well as he would like as his girlfriend just had surgery.    Pertinent History chronic knee pain B, Covid    Limitations Lifting;Standing;Walking;House hold activities    How long can you sit comfortably? no problem    How long can you stand comfortably? 30 minutes    How long can you walk comfortably? 30 minutes    Patient Stated Goals be stronger.    Currently in Pain? Yes    Pain Score 5     Pain Onset More than a month ago                [facility] PT Assessment - 07/18/21 0001  Assessment   Medical Diagnosis Rt knee pain    Referring Provider (PT) Arther Abbott    Onset Date/Surgical Date 03/23/21    Next MD Visit 07/19/2021                           [facility] Adult PT Treatment/Exercise - 07/18/21 0001       Knee/Hip Exercises: Stretches   Active Hamstring Stretch Both;2 reps;30 seconds   seated     Knee/Hip Exercises: Standing   Hip Extension 5 reps;Stengthening;AROM;Knee straight;4 sets    Extension Limitations 4# ankle weights    Functional Squat 2 sets;5 reps   10 seconds   Other Standing Knee Exercises hamstring curl 2x10 5" holds      Knee/Hip Exercises: Seated   Long Arc Quad --   x3 Bilaterally with 30 second holds   Sit to [employer] 3 sets;5 reps   10# weight                     PT Short Term Goals - 07/14/21 1226       PT SHORT TERM GOAL #1   Title PT to be I in HEP to improve LE strength to allow pt to  be able to come sit to stand from lower chairs/car without difficulty .    Time 2    Period Weeks    Status New    Target Date 07/28/21      PT SHORT TERM GOAL #2   Title PT to core and LE strength to be increased to allow pt to be able to come sit to stand 11 x in 30 seconds    Time 2    Period Weeks    Status New               PT Long Term Goals - 07/14/21 1228       PT LONG TERM GOAL #1   Title PT B  LE strength to be 4/5 to be able to go up and down 10 steps in a reciprocal manner using 1 hand hold on handrail    Time 4    Period Weeks    Status New    Target Date 08/11/21      PT LONG TERM GOAL #2   Title PT pain in RT LE to be no greater than a 4/10 throughout the day in order to tolerate daily activities    Time 4    Period Weeks    Status New      PT LONG TERM GOAL #3   Title PT to be able to single leg stance for at least 10 seconds B in order to reduce risk of falling    Time 4    Period Weeks    Status New                   Plan - 07/18/21 [id number]     Clinical Impression Statement Pain increased standing exercises so transitioned to seated exercises. Educated patient on how he could perform seated exercises anywhere. Cues to push feet into ground when transitioning from sit to stand. Encouraged patient to continue to try increase overall adherence to HEP. Added new exercises to HEP. Will continue to progress patient as tolerated. No increase in pain noted end of session, improved flexibility and reduced tension noted end of session.    Personal Factors and Comorbidities Time since onset of injury/illness/exacerbation;Fitness  Examination-Activity Limitations Carry;Lift;Locomotion Level;Stairs;Stand;Squat    Examination-Participation Restrictions Cleaning;Shop;Yard Work    Stability/Clinical Decision Making Stable/Uncomplicated    Rehab Potential Fair    PT Frequency 2x / week    PT Duration 4 weeks    PT Treatment/Interventions Therapeutic  exercise;Balance training;Neuromuscular re-education;Gait training;Stair training;Therapeutic activities;Manual techniques    PT Next Visit Plan Continue with functional strengthening of Rt LE to include heel raises, Step ups, lunges, sidesteps.    PT Home Exercise Plan sit to stand, functional squat, prone SLR B;  8/26 hip extension, hamstring curls             Patient will benefit from skilled therapeutic intervention in order to improve the following deficits and impairments:  Abnormal gait, Decreased activity tolerance, Decreased balance, Difficulty walking, Decreased strength, Pain, Obesity  Visit Diagnosis: Chronic pain of right knee  Muscle weakness (generalized)     Problem List Patient Active Problem List   Diagnosis Date Noted   Essential hypertension 07/15/2021   Stage 3a chronic kidney disease ([place]) 07/15/2021   Mixed hyperlipidemia 07/15/2021   Prostate cancer screening 07/15/2021   Impaired gait 03/05/2021   Recurrent pain of both knees 03/05/2021   Anemia of chronic illness 01/24/2021   Insomnia 01/24/2021   Acute meniscal tear of left knee 01/24/2021   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

## 2021-07-18 NOTE — Progress Notes (Signed)
Subjective:    Patient ID: Jackson Figueroa, male    DOB: [birth year], 60 y.o.   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   Feels like needs to push back the date to go back to work.   R hand is not back to where it needs to be- carpal tunnel release and ulnar transposition- 6/22.  Can do general household-  But much harder to use keyboard to type well or 10-key- Was typing 80 words/minute prior, but now 20-25 words/minute- so really hunting and pecking- fatigues easier- can type for ~ 1 hour at max- not 8 hours like did prior.   Brain fogginess- not "too bad".  Seems pretty clear to him- not "losing anything"- not losing house keys.   Still taking Tramadol a little- doesn't seem to help much.  Got Percocet for R hand surgery- helps a lot, but has a handful left- doesn't use much.   Saw Ortho [facility] Dr Aline Brochure- and sent him for R knee PT- just started the therapy- 4 weeks of PT 2x/week.  Started Monday- got HEP to do in between- doing "50%"- so forgets a lot.  GF had shoulder surgery the other day.    R hand- numbness/tingling- R elbow OK, but R hand very numb- tingles painfully, esp when touched. Nerve pain meds in past didn't really help.  Has to remember to use elbow pad when working as well.   Cannot grip real well yet.   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  Hasn't gotten long term disability yet.    Pain Inventory Average Pain 5 Pain Right Now 3 My pain is intermittent, sharp, and tingling  In the last 24 hours, has pain interfered with the following? General activity 3 Relation with others 2 Enjoyment of life 3 What TIME of day is your pain at its worst? daytime Sleep (in general) Fair  Pain is worse with: walking and some  activites Pain improves with: heat/ice and therapy/exercise Relief from Meds: 3  Family History  Problem Relation Age of Onset   Cerebral aneurysm Mother 19   Heart disease Father    Migraines Sister    Social History   Socioeconomic History   Marital status: Significant Other    Spouse name: Not on file   Number of children: Not on file   Years of education: Not on file   Highest education level: Not on file  Occupational History   Not on file  Tobacco Use   Smoking status: Former    Packs/day: 2.00    Types: Cigarettes    Quit date: 03/23/2012    Years since quitting: 9.3   Smokeless tobacco: Never  Vaping Use   Vaping Use: Never used  Substance and Sexual Activity   Alcohol use: Yes    Alcohol/week: 2.0 standard drinks    Types: 2 Glasses of wine per week    Comment: occasional wine   Drug use: Never   Sexual activity: Not on file  Other Topics Concern   Not on file  Social History Narrative   Not on file   Social Determinants of Health   Financial Resource Strain: Not on file  Food Insecurity: Not on file  Transportation Needs: Not on file  Physical Activity: Not on file  Stress: Not on file  Social Connections: Not on file   Past Surgical History:  Procedure Laterality Date   APPENDECTOMY  1982   COLONOSCOPY WITH PROPOFOL N/A 06/18/2021   Procedure: COLONOSCOPY WITH PROPOFOL;  Surgeon: Harvel Quale, MD;  [facility];  Service: Gastroenterology;  Laterality: N/A;  9:15   HERNIA REPAIR N/A    Phreesia 03/03/2021   INGUINAL HERNIA REPAIR Left 2000   IR GASTROSTOMY TUBE MOD SED  11/21/2020   IR GASTROSTOMY TUBE REMOVAL  01/17/2021   KNEE ARTHROSCOPY     bilateral knees   POLYPECTOMY  06/18/2021   Procedure: POLYPECTOMY INTESTINAL;  Surgeon: Harvel Quale, MD;  [facility];  Service: Gastroenterology;;   TONSILLECTOMY AND ADENOIDECTOMY     TRACHEOSTOMY TUBE PLACEMENT N/A 11/14/2020   Procedure: TRACHEOSTOMY;   Surgeon: Melida Quitter, MD;  [facility];  Service: ENT;  Laterality: N/A;   Past Surgical History:  Procedure Laterality Date   APPENDECTOMY  1982   COLONOSCOPY WITH PROPOFOL N/A 06/18/2021   Procedure: COLONOSCOPY WITH PROPOFOL;  Surgeon: Harvel Quale, MD;  [facility];  Service: Gastroenterology;  Laterality: N/A;  9:15   HERNIA REPAIR N/A    Phreesia 03/03/2021   INGUINAL HERNIA REPAIR Left 2000   IR GASTROSTOMY TUBE MOD SED  11/21/2020   IR GASTROSTOMY TUBE REMOVAL  01/17/2021   KNEE ARTHROSCOPY     bilateral knees   POLYPECTOMY  06/18/2021   Procedure: POLYPECTOMY INTESTINAL;  Surgeon: Harvel Quale, MD;  [facility];  Service: Gastroenterology;;   TONSILLECTOMY AND ADENOIDECTOMY     TRACHEOSTOMY TUBE PLACEMENT N/A 11/14/2020   Procedure: TRACHEOSTOMY;  Surgeon: Melida Quitter, MD;  [facility];  Service: ENT;  Laterality: N/A;   Past Medical History:  Diagnosis Date   Acute respiratory distress syndrome (ARDS) due to COVID-19 virus ([place])    Allergic    Dysphagia    GERD (gastroesophageal reflux disease)    Phreesia 03/03/2021   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    Tendinitis of elbow    right   Urinary incontinence 03/07/2021   BP 112/86   Pulse 91   Temp 98.4 F (36.9 C)   Ht 6' (1.829 m)   Wt 294 lb (133.4 kg)   SpO2 95%   BMI 39.87 kg/m   Opioid Risk Score:   Fall Risk Score:  `1  Depression screen PHQ 2/9  Depression screen [facility] 2/9 07/15/2021 03/06/2021 03/05/2021  Decreased Interest 0 0 0  Down, Depressed, Hopeless 0 0 0  PHQ - 2 Score 0 0 0  Altered sleeping - - 2  Tired, decreased energy - - 1  Change in appetite - - 0  Feeling bad or failure about yourself  - - 0   Trouble concentrating - - 0  Moving slowly or fidgety/restless - - 0  Suicidal thoughts - - 0  PHQ-9 Score - - 3  Difficult doing work/chores - Not difficult at all Somewhat difficult    Review of Systems  Musculoskeletal:        Pain in :Right  hand, right knee, left foot, right elbow  All other systems reviewed and are negative.     Objective:   Physical Exam  Awake, alert, appropriate, no assistive device, NAD R thenar eminence severe atrophy as well as severe atrophy and 1st dorsal interossei.   MS: RUE- WE 5-/5, grip 4/5, FA 2+/5; can make OK sign but can break easily; also can make claw, however much weaker in PIPs/DIPs than L side.  LUE_ 5/5 in LUE compared to RUE.  LE: RLE- HF 5-/5, KE/KF 4+/5, DF and PF 5-/5 LLE- HF 5-/5 otherwise 5/5.   Neuro: Sensation- decreased to light touch in ulnar side of 4th digit and 5th digit- - extreme numbness/tingling.       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  Sees Hand surgeon 9/1 again- need to discuss prognosis.  And when can go back to work-   2. Doesn't see the need to continue Tramadol since doesn't really help- does have a few Percocet left when things get bad.    3. Still has LE weakness due to ICU myopathy and long COVID- esp RLE>LLE- also told his glutes are also weak- esp notable with squats.   4. Suggest picking 3-4 exercises and doing home exercise plan DAILY! Of some kind.   5. Doesn't know if can work remotely- and still doing knee surgery-  Finishing therapy on Friday 9/23- so will look to get back to work 08/18/21.   6. Still not getting long term disability.  Unable to do his job at this time- and since unable to work remotely or part-time, will need to keep pt out of work until 9/25-  can return 08/18/21-  however if things change, might have push out a little further.   7. Will try Capscain lotion 0.025% apply daily- first three days, will burn? - try nightly- but help deplete the neurotransmitters by day 4- so pain improves.   8. F/U in 3 months-    I spent a total of 31 minute son visit- discussing long term being out of work, ICU myopathy and R ulnar neuropathy.

## 2021-07-20 DIAGNOSIS — G6281 Critical illness polyneuropathy: Secondary | ICD-10-CM | POA: Diagnosis not present

## 2021-07-21 ENCOUNTER — Other Ambulatory Visit: Payer: Self-pay

## 2021-07-21 ENCOUNTER — Encounter (HOSPITAL_COMMUNITY): Payer: Self-pay | Admitting: Physical Therapy

## 2021-07-21 ENCOUNTER — Ambulatory Visit (HOSPITAL_COMMUNITY): Payer: BC Managed Care – PPO | Admitting: Physical Therapy

## 2021-07-21 DIAGNOSIS — M6281 Muscle weakness (generalized): Secondary | ICD-10-CM

## 2021-07-21 DIAGNOSIS — M25561 Pain in right knee: Secondary | ICD-10-CM | POA: Diagnosis not present

## 2021-07-21 DIAGNOSIS — G8929 Other chronic pain: Secondary | ICD-10-CM

## 2021-07-21 NOTE — Therapy (Signed)
[place], [place], [address] Phone: [phone]   Fax:  [phone]  Physical Therapy Treatment  Patient Details  Name: [place] Figueroa MRN: [id number] Date of Birth: Mar 21, 1961 Referring Provider (PT): Arther Abbott   Encounter Date: 07/21/2021   PT End of Session - 07/21/21 0835     Visit Number 3    Number of Visits 8    Date for PT Re-Evaluation 08/13/21    Authorization Type BCBS    Authorization - Visit Number 3    Authorization - Number of Visits 8    Progress Note Due on Visit 8    PT Start Time [phone]    PT Stop Time 0911    PT Time Calculation (min) 38 min    Activity Tolerance Patient tolerated treatment well    Behavior During Therapy [facility] for tasks assessed/performed             Past Medical History:  Diagnosis Date   Acute respiratory distress syndrome (ARDS) due to COVID-19 virus [facility])    Allergic    Dysphagia    GERD (gastroesophageal reflux disease)    Phreesia 03/03/2021   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    Tendinitis of elbow    right   Urinary incontinence 03/07/2021    Past Surgical History:  Procedure Laterality Date   APPENDECTOMY  1982   COLONOSCOPY WITH PROPOFOL N/A 06/18/2021   Procedure: COLONOSCOPY WITH PROPOFOL;  Surgeon: Harvel Quale, MD;  [facility];  Service: Gastroenterology;  Laterality: N/A;  9:15   HERNIA REPAIR N/A    Phreesia 03/03/2021   INGUINAL HERNIA REPAIR Left 2000   IR GASTROSTOMY TUBE MOD SED  11/21/2020   IR GASTROSTOMY TUBE REMOVAL  01/17/2021   KNEE ARTHROSCOPY     bilateral knees   POLYPECTOMY  06/18/2021   Procedure: POLYPECTOMY INTESTINAL;  Surgeon: Harvel Quale, MD;  [facility];  Service: Gastroenterology;;   TONSILLECTOMY AND ADENOIDECTOMY     TRACHEOSTOMY TUBE PLACEMENT N/A 11/14/2020   Procedure: TRACHEOSTOMY;  Surgeon: Melida Quitter, MD;  [facility];  Service: ENT;  Laterality: N/A;    There were no vitals filed for this visit.   Subjective Assessment - 07/21/21 0852     Subjective States that he has some mild discomfort 4/10. States that he had increased knee pain 7/10 on Friday after last session. States that it was from PT but subsided so he could do some yard work this weekend.    Pertinent History chronic knee pain B, Covid    Limitations Lifting;Standing;Walking;House hold activities    How long can you sit comfortably? no problem    How long can you stand comfortably? 30 minutes    How long can you walk comfortably? 30 minutes    Patient Stated Goals be stronger.    Currently in Pain? Yes    Pain Score 4     Pain Location Knee    Pain Orientation Right;Lateral    Pain Descriptors / Indicators Aching    Pain Type Chronic pain    Pain Onset More than a month ago  [facility] PT Assessment - 07/21/21 0001       Assessment   Medical Diagnosis Rt knee pain    Referring Provider (PT) Arther Abbott    Onset Date/Surgical Date 03/23/21                           [facility] Adult PT Treatment/Exercise - 07/21/21 0001       Knee/Hip Exercises: Stretches   Active Hamstring Stretch Both;30 seconds;3 reps      Knee/Hip Exercises: Standing   Other Standing Knee Exercises lateral stepping with anchored band 2x8 5" holds B; hamstring curls x10 5" holds R    Other Standing Knee Exercises backwards stepping wiht forward anchor 2x10 5" holds B      Knee/Hip Exercises: Seated   Long Arc Quad 5 reps;Both   20 second holds   Sit to [employer] 10 reps;without UE support;2 sets   focus on keeping knees wide                   PT Education - 07/21/21 0848     Education Details on anticipated pain,  monitoring pain. and post workout soreness. on anatomy and how he ambulates    Person(s) Educated Patient    Methods Explanation    Comprehension Verbalized understanding              PT Short Term Goals - 07/14/21 1226       PT SHORT TERM GOAL #1   Title PT to be I in HEP to improve LE strength to allow pt to be able to come sit to stand from lower chairs/car without difficulty .    Time 2    Period Weeks    Status New    Target Date 07/28/21      PT SHORT TERM GOAL #2   Title PT to core and LE strength to be increased to allow pt to be able to come sit to stand 11 x in 30 seconds    Time 2    Period Weeks    Status New               PT Long Term Goals - 07/14/21 1228       PT LONG TERM GOAL #1   Title PT B  LE strength to be 4/5 to be able to go up and down 10 steps in a reciprocal manner using 1 hand hold on handrail    Time 4    Period Weeks    Status New    Target Date 08/11/21      PT LONG TERM GOAL #2   Title PT pain in RT LE to be no greater than a 4/10 throughout the day in order to tolerate daily activities    Time 4    Period Weeks    Status New      PT LONG TERM GOAL #3   Title PT to be able to single leg stance for at least 10 seconds B in order to reduce risk of falling    Time 4    Period Weeks    Status New                   Plan - 07/21/21 0902     Clinical Impression Statement Session focused on more hip and core strengthening secondary to recent report of increased knee pain. Tolerated this mildly well but standing increased baker cyst  pain behind knee. This was reduced slightly with stretching but pain remained there afterwards. Educated patient on current walking mechanics as he tends to ambulate and move with right leg locked in extension even with verbal cues. End of session patient reported fatigue and slight pain reported 1/10 in right knee but more stiffness.    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    PT Home Exercise Plan sit to stand, functional squat, prone SLR B;  8/26 hip extension, hamstring curls             Patient will benefit from skilled therapeutic intervention in order to improve the following deficits and impairments:  Abnormal gait, Decreased activity tolerance, Decreased balance, Difficulty walking, Decreased strength, Pain, Obesity  Visit Diagnosis: Chronic pain of right knee  Muscle weakness (generalized)     Problem List Patient Active Problem List   Diagnosis Date Noted   Essential hypertension 07/15/2021   Stage 3a chronic kidney disease ([place]) 07/15/2021   Mixed hyperlipidemia 07/15/2021   Prostate cancer screening 07/15/2021   Impaired gait 03/05/2021   Recurrent pain of both knees 03/05/2021   Anemia of chronic illness 01/24/2021   Insomnia 01/24/2021   Acute meniscal tear of left knee 01/24/2021   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    S/P percutaneous endoscopic gastrostomy (PEG) tube placement [facility])    Neuropathic pain    Debility 12/18/2020   Critical illness myopathy    Physical deconditioning    Protein calorie malnutrition ([place]) 11/11/2020   9:18 AM, 07/21/21 Jerene Pitch, DPT Physical Therapy with [facility]  [phone] office   [place], [place], [address] Phone: [phone]   Fax:  [phone]  Name: [place] Figueroa MRN: [id number] Date of Birth: Feb 13, 1961

## 2021-07-22 ENCOUNTER — Ambulatory Visit (HOSPITAL_COMMUNITY): Payer: BC Managed Care – PPO

## 2021-07-23 ENCOUNTER — Ambulatory Visit (HOSPITAL_COMMUNITY): Payer: BC Managed Care – PPO | Admitting: Physical Therapy

## 2021-07-23 ENCOUNTER — Other Ambulatory Visit: Payer: Self-pay

## 2021-07-23 DIAGNOSIS — G8929 Other chronic pain: Secondary | ICD-10-CM | POA: Diagnosis not present

## 2021-07-23 DIAGNOSIS — M25561 Pain in right knee: Secondary | ICD-10-CM | POA: Diagnosis not present

## 2021-07-23 DIAGNOSIS — M6281 Muscle weakness (generalized): Secondary | ICD-10-CM | POA: Diagnosis not present

## 2021-07-23 NOTE — Therapy (Signed)
[place], [place], [address] Phone: [phone]   Fax:  [phone]  Physical Therapy Treatment  Patient Details  Name: [place] Figueroa MRN: [id number] Date of Birth: 07-09-1961 Referring Provider (PT): Arther Abbott   Encounter Date: 07/23/2021   PT End of Session - 07/23/21 0755     Visit Number 4    Number of Visits 8    Date for PT Re-Evaluation 08/13/21    Authorization Type BCBS    Authorization - Visit Number 4    Authorization - Number of Visits 8    Progress Note Due on Visit 8    PT Start Time 0745    PT Stop Time 0825    PT Time Calculation (min) 40 min    Activity Tolerance Patient tolerated treatment well    Behavior During Therapy [facility] for tasks assessed/performed             Past Medical History:  Diagnosis Date   Acute respiratory distress syndrome (ARDS) due to COVID-19 virus [facility])    Allergic    Dysphagia    GERD (gastroesophageal reflux disease)    Phreesia 03/03/2021   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    Tendinitis of elbow    right   Urinary incontinence 03/07/2021    Past Surgical History:  Procedure Laterality Date   APPENDECTOMY  1982   COLONOSCOPY WITH PROPOFOL N/A 06/18/2021   Procedure: COLONOSCOPY WITH PROPOFOL;  Surgeon: Harvel Quale, MD;  [facility];  Service: Gastroenterology;  Laterality: N/A;  9:15   HERNIA REPAIR N/A    Phreesia 03/03/2021   INGUINAL HERNIA REPAIR Left 2000   IR GASTROSTOMY TUBE MOD SED  11/21/2020   IR GASTROSTOMY TUBE REMOVAL  01/17/2021   KNEE ARTHROSCOPY     bilateral knees   POLYPECTOMY  06/18/2021   Procedure: POLYPECTOMY INTESTINAL;  Surgeon: Harvel Quale, MD;  [facility];  Service: Gastroenterology;;   TONSILLECTOMY AND ADENOIDECTOMY     TRACHEOSTOMY TUBE PLACEMENT N/A 11/14/2020   Procedure: TRACHEOSTOMY;  Surgeon: Melida Quitter, MD;  [facility];  Service: ENT;  Laterality: N/A;    There were no vitals filed for this visit.   Subjective Assessment - 07/23/21 0746     Subjective Pt is doing his exercises has no questions.    Pertinent History chronic knee pain B, Covid    Limitations Lifting;Standing;Walking;House hold activities    How long can you sit comfortably? no problem    How long can you stand comfortably? 30 minutes    How long can you walk comfortably? 30 minutes    Patient Stated Goals be stronger.    Currently in Pain? Yes    Pain Score 3     Pain Location Knee    Pain Orientation Right    Pain Descriptors / Indicators Aching    Pain Type Chronic pain    Pain Onset More than a month ago    Pain Frequency Intermittent    Aggravating Factors  walking    Pain Relieving Factors ice, meds  [place] Adult PT Treatment/Exercise - 07/23/21 0001       Knee/Hip Exercises: Stretches   Active Hamstring Stretch Both;3 reps;30 seconds    Gastroc Stretch Limitations slant board 3 x 30"      Knee/Hip Exercises: Standing   Heel Raises Both;15 reps    Forward Step Up Both;10 reps;Step Height: 2"    Functional Squat 10 reps    Rocker Board 2 minutes;Other (comment);Limitations    Rocker Board Limitations ant/post ; med/laterasl    SLS x2 B    Other Standing Knee Exercises lateral stepping with anchored band 2x8 5" holds B; hamstring curls x10 5" holds R      Knee/Hip Exercises: Seated   Sit to Sand 10 reps;without UE support;2 sets   focus on keeping knees wide                     PT Short Term Goals - 07/23/21 0803       PT SHORT TERM GOAL #1   Title PT to be I in HEP to improve LE strength to allow pt to be able to come sit to stand from lower chairs/car  without difficulty .    Time 2    Period Weeks    Status On-going    Target Date 07/28/21      PT SHORT TERM GOAL #2   Title PT to core and LE strength to be increased to allow pt to be able to come sit to stand 11 x in 30 seconds    Time 2    Period Weeks    Status On-going               PT Long Term Goals - 07/23/21 [id number]       PT LONG TERM GOAL #1   Title PT B  LE strength to be 4/5 to be able to go up and down 10 steps in a reciprocal manner using 1 hand hold on handrail    Time 4    Period Weeks    Status On-going      PT LONG TERM GOAL #2   Title PT pain in RT LE to be no greater than a 4/10 throughout the day in order to tolerate daily activities    Time 4    Period Weeks    Status On-going      PT LONG TERM GOAL #3   Title PT to be able to single leg stance for at least 10 seconds B in order to reduce risk of falling    Time 4    Period Weeks    Status On-going                   Plan - 07/23/21 0759     Clinical Impression Statement Continued to progress WB exercises sto improve strength. Attempted 4" step up, however this was to difficult for pt therefore decreased to 2'. Pt will continue to benefit from skilled PT at this time.    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    Rehab Potential Fair    PT Frequency 2x / week    PT Duration 4 weeks    PT Treatment/Interventions Therapeutic exercise;Balance training;Neuromuscular re-education;Gait Scientist, forensic;Therapeutic activities;Manual techniques    PT Next Visit Plan Continue with functional strengthening of Rt LE to  include , lunges,    PT Home Exercise Plan sit to stand, functional squat, prone SLR B;  8/26 hip extension, hamstring  curls             Patient will benefit from skilled therapeutic intervention in order to improve the following deficits and impairments:  Abnormal gait, Decreased activity tolerance, Decreased balance, Difficulty walking, Decreased strength, Pain, Obesity  Visit Diagnosis: Chronic pain of right knee  Muscle weakness (generalized)     Problem List Patient Active Problem List   Diagnosis Date Noted   Essential hypertension 07/15/2021   Stage 3a chronic kidney disease ([place]) 07/15/2021   Mixed hyperlipidemia 07/15/2021   Prostate cancer screening 07/15/2021   Impaired gait 03/05/2021   Recurrent pain of both knees 03/05/2021   Anemia of chronic illness 01/24/2021   Insomnia 01/24/2021   Acute meniscal tear of left knee 01/24/2021   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    S/P percutaneous endoscopic gastrostomy (PEG) tube placement [facility])    Neuropathic pain    Debility 12/18/2020   Critical illness myopathy    Physical deconditioning    Protein calorie malnutrition [facility]) 11/11/2020   Rayetta Humphrey, PT CLT [phone]  07/23/2021, 8:27 AM  [place] [address] [place], [place], [address] Phone: [phone]   Fax:  [phone]  Name: [place] Figueroa MRN: [id number] Date of Birth: Oct 09, 1961

## 2021-07-28 ENCOUNTER — Encounter: Payer: Self-pay | Admitting: Internal Medicine

## 2021-07-29 ENCOUNTER — Ambulatory Visit (HOSPITAL_COMMUNITY)
Admission: RE | Admit: 2021-07-29 | Discharge: 2021-07-29 | Disposition: A | Discharge status: Home / Self Care | Payer: BC Managed Care – PPO | Source: Ambulatory Visit | Attending: Orthopedic Surgery | Admitting: Orthopedic Surgery

## 2021-07-29 ENCOUNTER — Other Ambulatory Visit: Payer: Self-pay

## 2021-07-29 DIAGNOSIS — M25561 Pain in right knee: Secondary | ICD-10-CM | POA: Insufficient documentation

## 2021-07-29 DIAGNOSIS — G8929 Other chronic pain: Secondary | ICD-10-CM | POA: Diagnosis not present

## 2021-07-29 DIAGNOSIS — S83281A Other tear of lateral meniscus, current injury, right knee, initial encounter: Secondary | ICD-10-CM | POA: Diagnosis not present

## 2021-07-29 DIAGNOSIS — M7121 Synovial cyst of popliteal space [Baker], right knee: Secondary | ICD-10-CM | POA: Diagnosis not present

## 2021-07-29 DIAGNOSIS — M1711 Unilateral primary osteoarthritis, right knee: Secondary | ICD-10-CM | POA: Diagnosis not present

## 2021-07-29 DIAGNOSIS — I8391 Asymptomatic varicose veins of right lower extremity: Secondary | ICD-10-CM | POA: Diagnosis not present

## 2021-07-30 ENCOUNTER — Ambulatory Visit (HOSPITAL_COMMUNITY): Payer: BC Managed Care – PPO | Attending: Orthopedic Surgery | Admitting: Physical Therapy

## 2021-07-30 ENCOUNTER — Encounter (HOSPITAL_COMMUNITY): Payer: Self-pay | Admitting: Physical Therapy

## 2021-07-30 DIAGNOSIS — G8929 Other chronic pain: Secondary | ICD-10-CM | POA: Diagnosis not present

## 2021-07-30 DIAGNOSIS — M25561 Pain in right knee: Secondary | ICD-10-CM | POA: Insufficient documentation

## 2021-07-30 DIAGNOSIS — M6281 Muscle weakness (generalized): Secondary | ICD-10-CM | POA: Insufficient documentation

## 2021-07-30 NOTE — Therapy (Signed)
[place], [place], [address] Phone: [phone]   Fax:  [phone]  Physical Therapy Treatment  Patient Details  Name: Jackson Figueroa MRN: [id number] Date of Birth: 1961/06/07 Referring Provider (PT): Arther Abbott   Encounter Date: 07/30/2021   PT End of Session - 07/30/21 0743     Visit Number 5    Number of Visits 8    Date for PT Re-Evaluation 08/13/21    Authorization Type BCBS    Authorization - Visit Number 5    Authorization - Number of Visits 8    Progress Note Due on Visit 8    PT Start Time 0744    PT Stop Time 0822    PT Time Calculation (min) 38 min    Activity Tolerance Patient tolerated treatment well    Behavior During Therapy [facility] for tasks assessed/performed             Past Medical History:  Diagnosis Date   Acute respiratory distress syndrome (ARDS) due to COVID-19 virus [facility])    Allergic    Dysphagia    GERD (gastroesophageal reflux disease)    Phreesia 03/03/2021   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    Tendinitis of elbow    right   Urinary incontinence 03/07/2021    Past Surgical History:  Procedure Laterality Date   APPENDECTOMY  1982   COLONOSCOPY WITH PROPOFOL N/A 06/18/2021   Procedure: COLONOSCOPY WITH PROPOFOL;  Surgeon: Harvel Quale, MD;  [facility];  Service: Gastroenterology;  Laterality: N/A;  9:15   HERNIA REPAIR N/A    Phreesia 03/03/2021   INGUINAL HERNIA REPAIR Left 2000   IR GASTROSTOMY TUBE MOD SED  11/21/2020   IR GASTROSTOMY TUBE REMOVAL  01/17/2021   KNEE ARTHROSCOPY     bilateral knees   POLYPECTOMY  06/18/2021   Procedure: POLYPECTOMY INTESTINAL;  Surgeon: Harvel Quale, MD;  [facility]  [facility];  Service: Gastroenterology;;   TONSILLECTOMY AND ADENOIDECTOMY     TRACHEOSTOMY TUBE PLACEMENT N/A 11/14/2020   Procedure: TRACHEOSTOMY;  Surgeon: Melida Quitter, MD;  [facility];  Service: ENT;  Laterality: N/A;    There were no vitals filed for this visit.   Subjective Assessment - 07/30/21 0744     Subjective Patient continues to have swelling in R knee. feels like it is getting stronger. He has been trying to work on [employer].    Pertinent History chronic knee pain B, Covid    Limitations Lifting;Standing;Walking;House hold activities    How long can you sit comfortably? no problem    How long can you stand comfortably? 30 minutes    How long can you walk comfortably? 30 minutes    Patient Stated Goals be stronger.    Currently in Pain? Yes    Pain Score --   .5   Pain Location Knee    Pain Orientation Right    Pain Descriptors / Indicators Aching    Pain Type Chronic pain    Pain Onset More than a month ago  [place] [facility] Adult PT Treatment/Exercise - 07/30/21 0001       Knee/Hip Exercises: Stretches   Gastroc Stretch Limitations slant board 3 x 30"      Knee/Hip Exercises: Standing   Heel Raises Both;15 reps    Forward Step Up Right;10 reps;1 set;Hand Hold: 2;Step Height: 4"    Rocker Board 2 minutes    Rocker Board Limitations ant/post ; med/laterasl    SLS with Vectors 5x5 second holds bilateral    Other Standing Knee Exercises lateral stepping with achored band 2x 5 5 feet bilateral with blue band      Knee/Hip Exercises: Seated   Sit to Sand 2 sets;10 reps;without UE support                  Upper Extremity Functional Index Score :   /80   PT Education - 07/30/21 0744     Education Details HEP    Person(s) Educated Patient    Methods Explanation    Comprehension Verbalized understanding              PT Short Term Goals - 07/23/21 0803       PT SHORT TERM GOAL #1   Title PT to be  I in HEP to improve LE strength to allow pt to be able to come sit to stand from lower chairs/car without difficulty .    Time 2    Period Weeks    Status On-going    Target Date 07/28/21      PT SHORT TERM GOAL #2   Title PT to core and LE strength to be increased to allow pt to be able to come sit to stand 11 x in 30 seconds    Time 2    Period Weeks    Status On-going               PT Long Term Goals - 07/23/21 [id number]       PT LONG TERM GOAL #1   Title PT B  LE strength to be 4/5 to be able to go up and down 10 steps in a reciprocal manner using 1 hand hold on handrail    Time 4    Period Weeks    Status On-going      PT LONG TERM GOAL #2   Title PT pain in RT LE to be no greater than a 4/10 throughout the day in order to tolerate daily activities    Time 4    Period Weeks    Status On-going      PT LONG TERM GOAL #3   Title PT to be able to single leg stance for at least 10 seconds B in order to reduce risk of falling    Time 4    Period Weeks    Status On-going                   Plan - 07/30/21 0744     Clinical Impression Statement Patient continues to have increase in knee pain with step up on 4 inch step but is able to complete. Patient with good mechanics following initial demonstration. Patient stating slight increase in symptoms at end of session. Patient demonstrating dynamic knee valgus R>L with STS secondary to glute weakness. Patient will continue to benefit from skilled physical therapy in order to reduce impairment and improve function.    Personal Factors and Comorbidities Time since onset of injury/illness/exacerbation;Fitness    Examination-Activity Limitations Carry;Lift;Locomotion Level;Stairs;Stand;Squat  Examination-Participation Restrictions Cleaning;Shop;Yard Work    Stability/Clinical Decision Making Stable/Uncomplicated    Rehab Potential Fair    PT Frequency 2x / week    PT Duration 4 weeks    PT Treatment/Interventions  Therapeutic exercise;Balance training;Neuromuscular re-education;Gait training;Stair training;Therapeutic activities;Manual techniques    PT Next Visit Plan Continue with functional strengthening of Rt LE to include , lunges,    PT Home Exercise Plan sit to stand, functional squat, prone SLR B;  8/26 hip extension, hamstring curls             Patient will benefit from skilled therapeutic intervention in order to improve the following deficits and impairments:  Abnormal gait, Decreased activity tolerance, Decreased balance, Difficulty walking, Decreased strength, Pain, Obesity  Visit Diagnosis: Chronic pain of right knee  Muscle weakness (generalized)     Problem List Patient Active Problem List   Diagnosis Date Noted   Essential hypertension 07/15/2021   Stage 3a chronic kidney disease ([place]) 07/15/2021   Mixed hyperlipidemia 07/15/2021   Prostate cancer screening 07/15/2021   Impaired gait 03/05/2021   Recurrent pain of both knees 03/05/2021   Anemia of chronic illness 01/24/2021   Insomnia 01/24/2021   Acute meniscal tear of left knee 01/24/2021   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

## 2021-07-31 ENCOUNTER — Ambulatory Visit (INDEPENDENT_AMBULATORY_CARE_PROVIDER_SITE_OTHER): Payer: BC Managed Care – PPO | Admitting: Orthopedic Surgery

## 2021-07-31 ENCOUNTER — Other Ambulatory Visit: Payer: Self-pay

## 2021-07-31 ENCOUNTER — Ambulatory Visit (HOSPITAL_COMMUNITY): Payer: BC Managed Care – PPO

## 2021-07-31 ENCOUNTER — Encounter (HOSPITAL_COMMUNITY): Payer: Self-pay

## 2021-07-31 ENCOUNTER — Encounter: Payer: Self-pay | Admitting: Orthopedic Surgery

## 2021-07-31 VITALS — Ht 72.0 in | Wt 297.0 lb

## 2021-07-31 DIAGNOSIS — G8929 Other chronic pain: Secondary | ICD-10-CM | POA: Diagnosis not present

## 2021-07-31 DIAGNOSIS — M1711 Unilateral primary osteoarthritis, right knee: Secondary | ICD-10-CM | POA: Diagnosis not present

## 2021-07-31 DIAGNOSIS — M1710 Unilateral primary osteoarthritis, unspecified knee: Secondary | ICD-10-CM

## 2021-07-31 DIAGNOSIS — M6281 Muscle weakness (generalized): Secondary | ICD-10-CM | POA: Diagnosis not present

## 2021-07-31 DIAGNOSIS — M25561 Pain in right knee: Secondary | ICD-10-CM | POA: Diagnosis not present

## 2021-07-31 NOTE — Therapy (Signed)
[place], [place], [address] Phone: [phone]   Fax:  [phone]  Physical Therapy Treatment  Patient Details  Name: Jackson Figueroa MRN: [id number] Date of Birth: [birth year] Referring Provider (PT): Arther Abbott   Encounter Date: 07/31/2021   PT End of Session - 07/31/21 0846     Visit Number 6    Number of Visits 8    Date for PT Re-Evaluation 08/13/21    Authorization Type BCBS    Authorization - Visit Number 5    Authorization - Number of Visits 8    Progress Note Due on Visit 8    PT Start Time [phone]    PT Stop Time 0930    PT Time Calculation (min) 43 min    Activity Tolerance Patient tolerated treatment well    Behavior During Therapy [facility] for tasks assessed/performed             Past Medical History:  Diagnosis Date   Acute respiratory distress syndrome (ARDS) due to COVID-19 virus [facility])    Allergic    Dysphagia    GERD (gastroesophageal reflux disease)    Phreesia 03/03/2021   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    Tendinitis of elbow    right   Urinary incontinence 03/07/2021    Past Surgical History:  Procedure Laterality Date   APPENDECTOMY  1982   COLONOSCOPY WITH PROPOFOL N/A 06/18/2021   Procedure: COLONOSCOPY WITH PROPOFOL;  Surgeon: Harvel Quale, MD;  [facility];  Service: Gastroenterology;  Laterality: N/A;  9:15   HERNIA REPAIR N/A    Phreesia 03/03/2021   INGUINAL HERNIA REPAIR Left 2000   IR GASTROSTOMY TUBE MOD SED  11/21/2020   IR GASTROSTOMY TUBE REMOVAL  01/17/2021   KNEE ARTHROSCOPY     bilateral knees   POLYPECTOMY  06/18/2021   Procedure: POLYPECTOMY INTESTINAL;  Surgeon: Harvel Quale, MD;  [facility];  Service: Gastroenterology;;   TONSILLECTOMY AND ADENOIDECTOMY     TRACHEOSTOMY TUBE PLACEMENT N/A 11/14/2020   Procedure: TRACHEOSTOMY;  Surgeon: Melida Quitter, MD;  [facility];  Service: ENT;  Laterality: N/A;    There were no vitals filed for this visit.   Subjective Assessment - 07/31/21 0845     Subjective Patient reports no pain at rest. Reports 5/10 right knee pain when coming to standing fom sitting. Left ankle has been acting up the last few days. Having a hard time remembering to do his HEP.    Pertinent History chronic knee pain B, Covid    Limitations Lifting;Standing;Walking;House hold activities    How long can you sit comfortably? no problem    How long can you stand comfortably? 30 minutes    How long can you walk comfortably? 30 minutes    Patient Stated Goals be stronger.    Currently in Pain? No/denies    Pain Onset More than a month ago              [facility] Adult PT Treatment/Exercise - 07/31/21 0001       Knee/Hip Exercises: Stretches   Gastroc Stretch Limitations slant board 3 x 30"  Knee/Hip Exercises: Standing   Heel Raises Both;15 reps   toe raises x15   Forward Lunges Both;1 set;5 reps   painful left knee   Forward Step Up Right;10 reps;1 set;Hand Hold: 2;Step Height: 4"    Rocker Board 2 minutes    Rocker Board Limitations ant/post ; med/lat    SLS with Vectors 5x5 second holds bilateral    Other Standing Knee Exercises lateral stepping with achored band 2x 5 5 feet bilateral machine walk out 6 plates 3 RT      Knee/Hip Exercises: Seated   Sit to Sand 2 sets;10 reps;without UE support                     PT Education - 07/31/21 0928     Education Details Discussed purpose and technique of skilled interventions throughout session. Advanceed HEP.    Person(s) Educated Patient    Methods Explanation;Handout    Comprehension Verbalized understanding              PT Short Term Goals - 07/31/21 0846        PT SHORT TERM GOAL #1   Title PT to be I in HEP to improve LE strength to allow pt to be able to come sit to stand from lower chairs/car without difficulty .    Time 2    Period Weeks    Status On-going    Target Date 07/28/21      PT SHORT TERM GOAL #2   Title PT to core and LE strength to be increased to allow pt to be able to come sit to stand 11 x in 30 seconds    Time 2    Period Weeks    Status On-going               PT Long Term Goals - 07/31/21 0846       PT LONG TERM GOAL #1   Title PT B  LE strength to be 4/5 to be able to go up and down 10 steps in a reciprocal manner using 1 hand hold on handrail    Time 4    Period Weeks    Status On-going      PT LONG TERM GOAL #2   Title PT pain in RT LE to be no greater than a 4/10 throughout the day in order to tolerate daily activities    Time 4    Period Weeks    Status On-going      PT LONG TERM GOAL #3   Title PT to be able to single leg stance for at least 10 seconds B in order to reduce risk of falling    Time 4    Period Weeks    Status On-going                   Plan - 07/31/21 0846     Clinical Impression Statement Session focused on lower extremity strengthening in open and closed chain, as well as, balance training. Cued to correct genu valgum during forward step ups and sit to stands. Attempted forward lunges with each knee with complaints of left knee pain on each limiting ability to complete. Added heel and toe raises to HEP. Patient will continue to benefit from skilled physical therapy to reduce Impairment and improve function.    Personal Factors and Comorbidities Time since onset of injury/illness/exacerbation;Fitness    Examination-Activity Limitations Carry;Lift;Locomotion Level;Stairs;Stand;Squat    Examination-Participation Restrictions Cleaning;Shop;Yard  Work    Stability/Clinical Decision Making Stable/Uncomplicated    Rehab Potential Fair    PT Frequency 2x / week    PT Duration  4 weeks    PT Treatment/Interventions Therapeutic exercise;Balance training;Neuromuscular re-education;Gait Scientist, forensic;Therapeutic activities;Manual techniques    PT Next Visit Plan Continue with functional strengthening of Rt LE to include , lunges,    PT Home Exercise Plan sit to stand, functional squat, prone SLR B;  8/26 hip extension, hamstring curls; 9/8 - heel/toe raises             Patient will benefit from skilled therapeutic intervention in order to improve the following deficits and impairments:  Abnormal gait, Decreased activity tolerance, Decreased balance, Difficulty walking, Decreased strength, Pain, Obesity  Visit Diagnosis: Chronic pain of right knee  Muscle weakness (generalized)     Problem List Patient Active Problem List   Diagnosis Date Noted   Essential hypertension 07/15/2021   Stage 3a chronic kidney disease ([place]) 07/15/2021   Mixed hyperlipidemia 07/15/2021   Prostate cancer screening 07/15/2021   Impaired gait 03/05/2021   Recurrent pain of both knees 03/05/2021   Anemia of chronic illness 01/24/2021   Insomnia 01/24/2021   Acute meniscal tear of left knee 01/24/2021   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 Hartnett-Rands, MS, PT Per [place] [phone]  Jeannie Done, PT 07/31/2021, 9:30 AM  [place] [address] [place], [place], [address] Phone: [phone]   Fax:  [phone]  Name: Artee Biddick MRN: [id number] Date of Birth: Jul 28, 1961

## 2021-07-31 NOTE — Progress Notes (Signed)
Chief Complaint  Patient presents with   Knee Pain    Right    Ht 6' (1.829 m)   Wt 297 lb (134.7 kg)   BMI 40.28 kg/m   The patient meets the AMA guidelines for Morbid (severe) obesity with a BMI > 40.0 and I have recommended weight loss.  Jackson Figueroa comes in today to discuss the results of his MRI which I have read.  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 medially near full-thickness cartilage loss of the patellofemoral joint and probably an old medial meniscal tear with meniscal extrusion and he has osteophyte surrounding his knee  After discussing the options of arthroscopic surgery versus total knee replacement he has decided to avoid surgery at this time work on weight loss and exercise  He will call us back if he wishes to proceed with surgery    Past Medical History:  Diagnosis Date   Acute respiratory distress syndrome (ARDS) due to COVID-19 virus ([place])    Allergic    Dysphagia    GERD (gastroesophageal reflux disease)    Phreesia 03/03/2021   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    Tendinitis of elbow    right   Urinary incontinence 03/07/2021   Past Surgical History:  Procedure Laterality Date   APPENDECTOMY  1982   COLONOSCOPY WITH PROPOFOL N/A 06/18/2021   Procedure: COLONOSCOPY WITH PROPOFOL;  Surgeon: Harvel Quale, MD;  [facility];  Service: Gastroenterology;  Laterality: N/A;  9:15   HERNIA REPAIR N/A    Phreesia 03/03/2021   INGUINAL HERNIA REPAIR Left 2000   IR GASTROSTOMY TUBE MOD SED  11/21/2020   IR GASTROSTOMY TUBE REMOVAL  01/17/2021   KNEE ARTHROSCOPY     bilateral knees   POLYPECTOMY  06/18/2021   Procedure: POLYPECTOMY INTESTINAL;  Surgeon: Harvel Quale, MD;  [facility];  Service: Gastroenterology;;   TONSILLECTOMY AND ADENOIDECTOMY     TRACHEOSTOMY TUBE PLACEMENT N/A 11/14/2020   Procedure: TRACHEOSTOMY;  Surgeon: Melida Quitter, MD;  [facility];  Service: ENT;  Laterality: N/A;

## 2021-07-31 NOTE — Patient Instructions (Signed)
Toe / Heel Raise (Standing)    Standing with support, raise heels, then rock back on heels and raise toes. Repeat 15-20_ times.  Copyright  VHI. All rights reserved.

## 2021-08-01 ENCOUNTER — Ambulatory Visit (HOSPITAL_COMMUNITY): Payer: BC Managed Care – PPO | Admitting: Physical Therapy

## 2021-08-05 ENCOUNTER — Other Ambulatory Visit: Payer: Self-pay

## 2021-08-05 ENCOUNTER — Ambulatory Visit (HOSPITAL_COMMUNITY): Payer: BC Managed Care – PPO | Admitting: Physical Therapy

## 2021-08-05 DIAGNOSIS — M6281 Muscle weakness (generalized): Secondary | ICD-10-CM | POA: Diagnosis not present

## 2021-08-05 DIAGNOSIS — G8929 Other chronic pain: Secondary | ICD-10-CM

## 2021-08-05 DIAGNOSIS — M25561 Pain in right knee: Secondary | ICD-10-CM | POA: Diagnosis not present

## 2021-08-05 NOTE — Therapy (Signed)
[place], [place], [address] Phone: [phone]   Fax:  [phone]  Physical Therapy Treatment  Patient Details  Name: Jackson Figueroa MRN: [id number] Date of Birth: [birth year] Referring Provider (PT): Arther Abbott   Encounter Date: 08/05/2021   PT End of Session - 08/05/21 1004     Visit Number 7    Number of Visits 8    Date for PT Re-Evaluation 08/13/21    Authorization Type BCBS    Authorization - Visit Number 7    Authorization - Number of Visits 8    Progress Note Due on Visit 8    PT Start Time 0925    PT Stop Time 1008    PT Time Calculation (min) 43 min    Activity Tolerance Patient tolerated treatment well    Behavior During Therapy [facility] 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    Tendinitis of elbow    right   Urinary incontinence 03/07/2021    Past Surgical History:  Procedure Laterality Date   APPENDECTOMY  1982   COLONOSCOPY WITH PROPOFOL N/A 06/18/2021   Procedure: COLONOSCOPY WITH PROPOFOL;  Surgeon: Harvel Quale, MD;  [facility];  Service: Gastroenterology;  Laterality: N/A;  9:15   HERNIA REPAIR N/A    Phreesia 03/03/2021   INGUINAL HERNIA REPAIR Left 2000   IR GASTROSTOMY TUBE MOD SED  11/21/2020   IR GASTROSTOMY TUBE REMOVAL  01/17/2021   KNEE ARTHROSCOPY     bilateral knees   POLYPECTOMY  06/18/2021   Procedure: POLYPECTOMY INTESTINAL;  Surgeon: Harvel Quale, MD;  [facility];  Service: Gastroenterology;;   TONSILLECTOMY AND ADENOIDECTOMY     TRACHEOSTOMY TUBE PLACEMENT N/A 11/14/2020   Procedure: TRACHEOSTOMY;  Surgeon: Melida Quitter, MD;  [facility];  Service: ENT;  Laterality: N/A;    There were no vitals filed for this visit.   Subjective Assessment - 08/05/21 0925     Subjective Pt states that his knee continues to improve.    Pertinent History chronic knee pain B, Covid    Limitations Lifting;Standing;Walking;House hold activities    How long can you sit comfortably? no problem    How long can you stand comfortably? 30 minutes    How long can you walk comfortably? 30 minutes    Patient Stated Goals be stronger.    Currently in Pain? Yes    Pain Location Knee    Pain Orientation Right    Pain Descriptors / Indicators Aching    Pain Type Chronic pain    Pain Onset More than a month ago    Pain Frequency Intermittent    Aggravating Factors  steps    Pain Relieving Factors ice , med    Effect of Pain on Daily Activities limits  [facility] Adult PT Treatment/Exercise - 08/05/21 0001       Exercises   Exercises Knee/Hip      Knee/Hip Exercises: Stretches   Active Hamstring Stretch Both;3 reps;30 seconds    Gastroc Stretch Limitations slant board 1x 60"      Knee/Hip Exercises: Standing   Heel Raises 15 reps    Lateral Step Up Right;10 reps    Forward Step Up Right;10 reps    Functional Squat 10 reps    Rocker Board 2 minutes    Rocker Board Limitations med/lat    SLS x 3 B on foam    Other Standing Knee Exercises lateral stepping with achored band 2x 5 feet bilateral machine walk out 7 plates 3 RT    Other Standing Knee Exercises backwards stepping wiht forward anchor 2x10 5" holds B      Knee/Hip Exercises: Seated   Sit to Sand 15 reps                       PT Short Term Goals - 07/31/21 0846       PT SHORT TERM GOAL #1   Title PT to be I in HEP to  improve LE strength to allow pt to be able to come sit to stand from lower chairs/car without difficulty .    Time 2    Period Weeks    Status On-going    Target Date 07/28/21      PT SHORT TERM GOAL #2   Title PT to core and LE strength to be increased to allow pt to be able to come sit to stand 11 x in 30 seconds    Time 2    Period Weeks    Status On-going               PT Long Term Goals - 07/31/21 0846       PT LONG TERM GOAL #1   Title PT B  LE strength to be 4/5 to be able to go up and down 10 steps in a reciprocal manner using 1 hand hold on handrail    Time 4    Period Weeks    Status On-going      PT LONG TERM GOAL #2   Title PT pain in RT LE to be no greater than a 4/10 throughout the day in order to tolerate daily activities    Time 4    Period Weeks    Status On-going      PT LONG TERM GOAL #3   Title PT to be able to single leg stance for at least 10 seconds B in order to reduce risk of falling    Time 4    Period Weeks    Status On-going                   Plan - 08/05/21 1006     Clinical Impression Statement Pt still needs verbal cuing not to allow knees to go into valgus when completing exercises.  Pt demonstrating improved strength and endurance with exercise program    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    Clinical Decision Making Moderate    Rehab Potential Fair    PT Frequency 2x / week    PT Duration 4 weeks    PT Treatment/Interventions  Therapeutic exercise;Balance training;Neuromuscular re-education;Gait training;Stair training;Therapeutic activities;Manual techniques    PT Next Visit Plan Continue with functional strengthening of Rt LE to include , lunges,    PT Home  Exercise Plan sit to stand, functional squat, prone SLR B;  8/26 hip extension, hamstring curls; 9/8 - heel/toe raises             Patient will benefit from skilled therapeutic intervention in order to improve the following deficits and impairments:  Abnormal gait, Decreased activity tolerance, Decreased balance, Difficulty walking, Decreased strength, Pain, Obesity  Visit Diagnosis: Chronic pain of right knee  Muscle weakness (generalized)     Problem List Patient Active Problem List   Diagnosis Date Noted   Essential hypertension 07/15/2021   Stage 3a chronic kidney disease (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 [place], [place], [address] Phone: [phone]   Fax:  [phone]  Name: Jackson Figueroa MRN: [id number] Date of Birth: 01/13/1961

## 2021-08-07 ENCOUNTER — Encounter (HOSPITAL_COMMUNITY): Payer: Self-pay | Admitting: Physical Therapy

## 2021-08-07 ENCOUNTER — Other Ambulatory Visit: Payer: Self-pay

## 2021-08-07 ENCOUNTER — Ambulatory Visit (HOSPITAL_COMMUNITY): Payer: BC Managed Care – PPO | Admitting: Physical Therapy

## 2021-08-07 DIAGNOSIS — G8929 Other chronic pain: Secondary | ICD-10-CM

## 2021-08-07 DIAGNOSIS — M25561 Pain in right knee: Secondary | ICD-10-CM | POA: Diagnosis not present

## 2021-08-07 DIAGNOSIS — M6281 Muscle weakness (generalized): Secondary | ICD-10-CM | POA: Diagnosis not present

## 2021-08-07 NOTE — Therapy (Signed)
[facility] [address] [place], [place], [address] Phone: [phone]   Fax:  [phone]  Physical Therapy Treatment  Patient Details  Name: Jackson Figueroa MRN: [id number] Date of Birth: [birth year] Referring Provider (PT): Fuller [place] Progress Note Reporting Period 07/14/21 to 08/07/21  See note below for Objective Data and Assessment of Progress/Goals.     Encounter Date: 08/07/2021   PT End of Session - 08/07/21 0902     Visit Number 8    Number of Visits 12    Date for PT Re-Evaluation 08/21/21    Authorization Type BCBS    Authorization - Visit Number 8    Authorization - Number of Visits 12    Progress Note Due on Visit 12    PT Start Time 0832    PT Stop Time 0915    PT Time Calculation (min) 43 min    Activity Tolerance Patient tolerated treatment well    Behavior During Therapy WFL for tasks assessed/performed             Past Medical History:  Diagnosis Date   Acute respiratory distress syndrome (ARDS) due to COVID-19 virus (HCC)    Allergic    Dysphagia    GERD (gastroesophageal reflux disease)    Phreesia 03/03/2021   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    Tendinitis of elbow    right   Urinary incontinence 03/07/2021    Past Surgical History:  Procedure Laterality Date   APPENDECTOMY  1982   COLONOSCOPY WITH PROPOFOL N/A 06/18/2021   Procedure: COLONOSCOPY WITH PROPOFOL;  Surgeon: Dolores Frame, MD;  [facility];  Service: Gastroenterology;  Laterality: N/A;  9:15   HERNIA REPAIR N/A    Phreesia 03/03/2021   INGUINAL HERNIA REPAIR Left 2000   IR GASTROSTOMY TUBE MOD SED  11/21/2020   IR GASTROSTOMY TUBE REMOVAL  01/17/2021   KNEE ARTHROSCOPY      bilateral knees   POLYPECTOMY  06/18/2021   Procedure: POLYPECTOMY INTESTINAL;  Surgeon: Dolores Frame, MD;  [facility];  Service: Gastroenterology;;   TONSILLECTOMY AND ADENOIDECTOMY     TRACHEOSTOMY TUBE PLACEMENT N/A 11/14/2020   Procedure: TRACHEOSTOMY;  Surgeon: Christia Reading, MD;  Location: [facility];  Service: ENT;  Laterality: N/A;    There were no vitals filed for this visit.   Subjective Assessment - 08/07/21 0834     Subjective Pt feels that he is about 40% improved.  He is still having difficulty with steps and occasionally sit to stand.    Pertinent History chronic knee pain B, Covid    Limitations Lifting;Standing;Walking;House hold activities    How long can you sit comfortably? no problem    How long can you stand comfortably? 45 minutes was 30 minutes    How long can you walk comfortably? 60 minutes was 30 minutes    Patient Stated Goals be stronger.    Currently in Pain? Yes    Pain Score 2     Pain Location Knee    Pain Descriptors / Indicators Aching    Pain Type Chronic pain    Pain Onset More than a month ago  Pain Frequency Intermittent    Aggravating Factors  steps    Pain Relieving Factors ice    Effect of Pain on Daily Activities limits                [facility] PT Assessment - 08/07/21 0001       Assessment   Medical Diagnosis Rt knee pain    Referring Provider (PT) Arther Abbott    Onset Date/Surgical Date 03/23/21    Next MD Visit 07/19/2021    Prior Therapy IP rehab, [facility]      Precautions   Precautions [place] residence    Home Access Stairs to enter    Entrance Stairs-Number of Steps 7    Home Layout Other (Comment)   3 stories     Prior Function   Level of Independence Independent      Cognition   Overall Cognitive Status Within Functional Limits for tasks assessed      Observation/Other Assessments   Focus on Therapeutic Outcomes (FOTO)  59 was 49;       Functional Tests   Functional tests Single leg stance;Sit to Stand      Single Leg Stance   Comments RT 24" was  2"; Lt 8 "was  5"   Lt ankle is bothering pt today     Sit to Stand   Comments 13 was 6 in 30 seconds      Strength   Right Hip Flexion 4/5    Right Hip Extension 4-/5   was 4+/5   Right Hip ABduction 4/5    Left Hip Flexion 5/5    Left Hip Extension 4-/5   was 3+   Left Hip ABduction 5/5    Right Knee Flexion 5/5   was 4/5   Right Knee Extension 4/5   was 3+   Left Knee Flexion 5/5    Left Knee Extension 5/5                           [facility] Adult PT Treatment/Exercise - 08/07/21 0001       Ambulation/Gait   Stairs Yes    Stairs Assistance 6: Modified independent (Device/Increase time)    Stair Management Technique One rail Right    Number of Stairs 12    Height of Stairs 7      Exercises   Exercises Knee/Hip      Knee/Hip Exercises: Standing   Functional Squat 10 reps    Functional Squat Limitations with 10# goblet squat    SLS with Vectors 5x5 second holds bilateral      Knee/Hip Exercises: Seated   Sit to Sand 15 reps      Knee/Hip Exercises: Prone   Hip Extension Strengthening;Both;15 reps                       PT Short Term Goals - 08/07/21 0856       PT SHORT TERM GOAL #1   Title PT to be I in HEP to improve LE strength to allow pt to be able to come sit to stand from lower chairs/car without difficulty .    Time 2    Period Weeks    Status Achieved    Target Date 07/28/21      PT SHORT TERM GOAL #2   Title PT to core and LE strength to be  increased to allow pt to be able to come sit to stand 11 x in 30 seconds    Time 2    Period Weeks    Status Achieved               PT Long Term Goals - 08/07/21 5449       PT LONG TERM GOAL #1   Title PT B  LE strength to be 4/5 to be able to go up and down 10 steps in a reciprocal manner using 1 hand hold on handrail    Time 4    Period Weeks    Status  Partially Met   able to do but increased pain     PT LONG TERM GOAL #2   Title PT pain in RT LE to be no greater than a 4/10 throughout the day in order to tolerate daily activities    Time 4    Period Weeks    Status Achieved      PT LONG TERM GOAL #3   Title PT to be able to single leg stance for at least 10 seconds B in order to reduce risk of falling    Time 4    Period Weeks    Status Partially Met                   Plan - 08/07/21 0903     Clinical Impression Statement PT reassessed with noted improvement in all areas, however he continues to have strength and balance issues as well as pain with stair climbing. HE has met 2/2 STG and 2/4 LTG  Pt will continue to benefit from skilled PT to improve his strength and balance to improve his functional ability    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    Rehab Potential Fair    PT Frequency 2x / week    PT Duration 6 weeks    PT Treatment/Interventions Therapeutic exercise;Balance training;Neuromuscular re-education;Gait Scientist, forensic;Therapeutic activities;Manual techniques    PT Next Visit Plan Continue with functional strengthening of Rt LE to include , lunges,    PT Home Exercise Plan sit to stand, functional squat, prone SLR B;  8/26 hip extension, hamstring curls; 9/8 - heel/toe raises; 9/15: single leg stance, vector             Patient will benefit from skilled therapeutic intervention in order to improve the following deficits and impairments:  Abnormal gait, Decreased activity tolerance, Decreased balance, Difficulty walking, Decreased strength, Pain, Obesity  Visit Diagnosis: Chronic pain of right knee  Muscle weakness  (generalized)     Problem List Patient Active Problem List   Diagnosis Date Noted   Essential hypertension 07/15/2021   Stage 3a chronic kidney disease ([place]) 07/15/2021   Mixed hyperlipidemia 07/15/2021   Prostate cancer screening 07/15/2021   Impaired gait 03/05/2021   Recurrent pain of both knees 03/05/2021   Anemia of chronic illness 01/24/2021   Insomnia 01/24/2021   Acute meniscal tear of left knee 01/24/2021   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 [place], [place], [address] Phone: [phone]   Fax:  [phone]  Name: Jackson Figueroa MRN: [id number] Date of Birth: Sep 26, 1961

## 2021-08-08 DIAGNOSIS — M542 Cervicalgia: Secondary | ICD-10-CM | POA: Diagnosis not present

## 2021-08-08 DIAGNOSIS — M9902 Segmental and somatic dysfunction of thoracic region: Secondary | ICD-10-CM | POA: Diagnosis not present

## 2021-08-08 DIAGNOSIS — M9901 Segmental and somatic dysfunction of cervical region: Secondary | ICD-10-CM | POA: Diagnosis not present

## 2021-08-08 DIAGNOSIS — M546 Pain in thoracic spine: Secondary | ICD-10-CM | POA: Diagnosis not present

## 2021-08-09 DIAGNOSIS — G6281 Critical illness polyneuropathy: Secondary | ICD-10-CM | POA: Diagnosis not present

## 2021-08-11 DIAGNOSIS — M9902 Segmental and somatic dysfunction of thoracic region: Secondary | ICD-10-CM | POA: Diagnosis not present

## 2021-08-11 DIAGNOSIS — M546 Pain in thoracic spine: Secondary | ICD-10-CM | POA: Diagnosis not present

## 2021-08-11 DIAGNOSIS — M542 Cervicalgia: Secondary | ICD-10-CM | POA: Diagnosis not present

## 2021-08-11 DIAGNOSIS — M9901 Segmental and somatic dysfunction of cervical region: Secondary | ICD-10-CM | POA: Diagnosis not present

## 2021-08-12 ENCOUNTER — Ambulatory Visit (HOSPITAL_COMMUNITY): Payer: BC Managed Care – PPO | Admitting: Physical Therapy

## 2021-08-12 ENCOUNTER — Other Ambulatory Visit: Payer: Self-pay

## 2021-08-12 DIAGNOSIS — M6281 Muscle weakness (generalized): Secondary | ICD-10-CM

## 2021-08-12 DIAGNOSIS — G8929 Other chronic pain: Secondary | ICD-10-CM

## 2021-08-12 DIAGNOSIS — M25561 Pain in right knee: Secondary | ICD-10-CM

## 2021-08-12 NOTE — Therapy (Signed)
[place], [place], [address] Phone: [phone]   Fax:  [phone]  Physical Therapy Treatment  Patient Details  Name: Jackson Figueroa MRN: [id number] Date of Birth: December 22, 1960 Referring Provider (PT): Arther Abbott   Encounter Date: 08/12/2021   PT End of Session - 08/12/21 0850     Visit Number 9    Number of Visits 12    Date for PT Re-Evaluation 08/21/21    Authorization Type BCBS    Authorization - Visit Number 9    Authorization - Number of Visits 12    Progress Note Due on Visit 12    PT Start Time 0831    PT Stop Time 0910    PT Time Calculation (min) 39 min    Activity Tolerance Patient tolerated treatment well    Behavior During Therapy 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    Tendinitis of elbow    right   Urinary incontinence 03/07/2021    Past Surgical History:  Procedure Laterality Date   APPENDECTOMY  1982   COLONOSCOPY WITH PROPOFOL N/A 06/18/2021   Procedure: COLONOSCOPY WITH PROPOFOL;  Surgeon: Harvel Quale, MD;  [facility];  Service: Gastroenterology;  Laterality: N/A;  9:15   HERNIA REPAIR N/A    Phreesia 03/03/2021   INGUINAL HERNIA REPAIR Left 2000   IR GASTROSTOMY TUBE MOD SED  11/21/2020   IR GASTROSTOMY TUBE REMOVAL  01/17/2021   KNEE ARTHROSCOPY     bilateral knees   POLYPECTOMY  06/18/2021   Procedure: POLYPECTOMY INTESTINAL;  Surgeon: Harvel Quale, MD;   [facility];  Service: Gastroenterology;;   TONSILLECTOMY AND ADENOIDECTOMY     TRACHEOSTOMY TUBE PLACEMENT N/A 11/14/2020   Procedure: TRACHEOSTOMY;  Surgeon: Melida Quitter, MD;  [facility];  Service: ENT;  Laterality: N/A;    There were no vitals filed for this visit.   Subjective Assessment - 08/12/21 0830     Subjective Pt has no complaints. He has been up step stools putting up decorations.    Pertinent History chronic knee pain B, Covid    Limitations Lifting;Standing;Walking;House hold activities    How long can you sit comfortably? no problem    How long can you stand comfortably? 45 minutes was 30 minutes    How long can you walk comfortably? 60 minutes was 30 minutes    Patient Stated Goals be stronger.    Currently in Pain? Yes    Pain Score 2     Pain Location Knee    Pain Orientation Right    Pain Descriptors / Indicators Aching    Pain Type Chronic pain    Pain Onset More than a month ago    Pain Frequency Intermittent    Aggravating Factors  steps    Pain Relieving Factors ice    Effect  of Pain on Daily Activities limits                               [facility] Adult PT Treatment/Exercise - 08/12/21 0001       Exercises   Exercises Knee/Hip      Knee/Hip Exercises: Stretches   Gastroc Stretch Limitations slant board 3 x 30"      Knee/Hip Exercises: Machines for Strengthening   Cybex Knee Extension 1 PL B x 15    Total Gym Leg Press 5pl x 15      Knee/Hip Exercises: Standing   Heel Raises 15 reps    Forward Step Up Both;10 reps   on step   Step Down Both;10 reps   on step   Functional Squat 10 reps    Functional Squat Limitations with 10# goblet squat    SLS x 3 B on foam    Other Standing Knee Exercises backward stepping at body craft 6 PL x 5                       PT Short Term Goals - 08/07/21 0856       PT SHORT TERM GOAL #1   Title PT to be I in HEP to improve LE strength to allow pt to be able  to come sit to stand from lower chairs/car without difficulty .    Time 2    Period Weeks    Status Achieved    Target Date 07/28/21      PT SHORT TERM GOAL #2   Title PT to core and LE strength to be increased to allow pt to be able to come sit to stand 11 x in 30 seconds    Time 2    Period Weeks    Status Achieved               PT Long Term Goals - 08/07/21 8022       PT LONG TERM GOAL #1   Title PT B  LE strength to be 4/5 to be able to go up and down 10 steps in a reciprocal manner using 1 hand hold on handrail    Time 4    Period Weeks    Status Partially Met   able to do but increased pain     PT LONG TERM GOAL #2   Title PT pain in RT LE to be no greater than a 4/10 throughout the day in order to tolerate daily activities    Time 4    Period Weeks    Status Achieved      PT LONG TERM GOAL #3   Title PT to be able to single leg stance for at least 10 seconds B in order to reduce risk of falling    Time 4    Period Weeks    Status Partially Met                   Plan - 08/12/21 0910     Personal Factors and Comorbidities Time since onset of injury/illness/exacerbation;Fitness    Examination-Activity Limitations Carry;Lift;Locomotion Level;Stairs;Stand;Squat    Examination-Participation Restrictions Cleaning;Shop;Yard Work    Conservation officer, historic buildings Stable/Uncomplicated    Rehab Potential Fair    PT Frequency 2x / week    PT Duration 6 weeks    PT Treatment/Interventions Therapeutic exercise;Balance training;Neuromuscular re-education;Gait Network engineer;Therapeutic activities;Manual techniques  PT Next Visit Plan Continue with functional strengthening of Rt LE    PT Home Exercise Plan sit to stand, functional squat, prone SLR B;  8/26 hip extension, hamstring curls; 9/8 - heel/toe raises; 9/15: single leg stance, vector             Patient will benefit from skilled therapeutic intervention in order to improve the  following deficits and impairments:  Abnormal gait, Decreased activity tolerance, Decreased balance, Difficulty walking, Decreased strength, Pain, Obesity  Visit Diagnosis: Chronic pain of right knee  Muscle weakness (generalized)     Problem List Patient Active Problem List   Diagnosis Date Noted   Essential hypertension 07/15/2021   Stage 3a chronic kidney disease ([place]) 07/15/2021   Mixed hyperlipidemia 07/15/2021   Prostate cancer screening 07/15/2021   Impaired gait 03/05/2021   Recurrent pain of both knees 03/05/2021   Anemia of chronic illness 01/24/2021   Insomnia 01/24/2021   Acute meniscal tear of left knee 01/24/2021   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 [place], [place], [address] Phone: [phone]   Fax:  [phone]  Name: Jackson Figueroa MRN: [id number] Date of Birth: Apr 12, 1961

## 2021-08-12 NOTE — Addendum Note (Signed)
Addended by: Leeroy Cha on: 08/12/2021 09:06 AM   Modules accepted: Orders

## 2021-08-14 ENCOUNTER — Encounter (HOSPITAL_COMMUNITY): Payer: BC Managed Care – PPO | Admitting: Physical Therapy

## 2021-08-15 DIAGNOSIS — M9902 Segmental and somatic dysfunction of thoracic region: Secondary | ICD-10-CM | POA: Diagnosis not present

## 2021-08-15 DIAGNOSIS — M542 Cervicalgia: Secondary | ICD-10-CM | POA: Diagnosis not present

## 2021-08-15 DIAGNOSIS — M546 Pain in thoracic spine: Secondary | ICD-10-CM | POA: Diagnosis not present

## 2021-08-15 DIAGNOSIS — M9901 Segmental and somatic dysfunction of cervical region: Secondary | ICD-10-CM | POA: Diagnosis not present

## 2021-08-20 DIAGNOSIS — G6281 Critical illness polyneuropathy: Secondary | ICD-10-CM | POA: Diagnosis not present

## 2021-08-22 DIAGNOSIS — M546 Pain in thoracic spine: Secondary | ICD-10-CM | POA: Diagnosis not present

## 2021-08-22 DIAGNOSIS — M9902 Segmental and somatic dysfunction of thoracic region: Secondary | ICD-10-CM | POA: Diagnosis not present

## 2021-08-22 DIAGNOSIS — M542 Cervicalgia: Secondary | ICD-10-CM | POA: Diagnosis not present

## 2021-08-22 DIAGNOSIS — M9901 Segmental and somatic dysfunction of cervical region: Secondary | ICD-10-CM | POA: Diagnosis not present

## 2021-08-22 NOTE — Telephone Encounter (Signed)
Please fax letter- ML

## 2021-09-04 DIAGNOSIS — G5601 Carpal tunnel syndrome, right upper limb: Secondary | ICD-10-CM | POA: Diagnosis not present

## 2021-09-04 DIAGNOSIS — G5621 Lesion of ulnar nerve, right upper limb: Secondary | ICD-10-CM | POA: Diagnosis not present

## 2021-09-08 DIAGNOSIS — G6281 Critical illness polyneuropathy: Secondary | ICD-10-CM | POA: Diagnosis not present

## 2021-09-19 DIAGNOSIS — G6281 Critical illness polyneuropathy: Secondary | ICD-10-CM | POA: Diagnosis not present

## 2021-10-02 DIAGNOSIS — Z0000 Encounter for general adult medical examination without abnormal findings: Secondary | ICD-10-CM | POA: Diagnosis not present

## 2021-10-02 DIAGNOSIS — Z125 Encounter for screening for malignant neoplasm of prostate: Secondary | ICD-10-CM | POA: Diagnosis not present

## 2021-10-03 LAB — CBC WITH DIFFERENTIAL/PLATELET
Basophils Absolute: 0 10*3/uL (ref 0.0–0.2)
Basos: 0 %
EOS (ABSOLUTE): 0.2 10*3/uL (ref 0.0–0.4)
Eos: 5 %
Hematocrit: 40.5 % (ref 37.5–51.0)
Hemoglobin: 14 g/dL (ref 13.0–17.7)
Immature Grans (Abs): 0 10*3/uL (ref 0.0–0.1)
Immature Granulocytes: 0 %
Lymphocytes Absolute: 1.3 10*3/uL (ref 0.7–3.1)
Lymphs: 28 %
MCH: 33.6 pg — ABNORMAL HIGH (ref 26.6–33.0)
MCHC: 34.6 g/dL (ref 31.5–35.7)
MCV: 97 fL (ref 79–97)
Monocytes Absolute: 0.4 10*3/uL (ref 0.1–0.9)
Monocytes: 8 %
Neutrophils Absolute: 2.7 10*3/uL (ref 1.4–7.0)
Neutrophils: 59 %
Platelets: 212 10*3/uL (ref 150–450)
RBC: 4.17 x10E6/uL (ref 4.14–5.80)
RDW: 12.3 % (ref 11.6–15.4)
WBC: 4.6 10*3/uL (ref 3.4–10.8)

## 2021-10-03 LAB — CMP14+EGFR
ALT: 24 IU/L (ref 0–44)
AST: 21 IU/L (ref 0–40)
Albumin/Globulin Ratio: 2 (ref 1.2–2.2)
Albumin: 4.5 g/dL (ref 3.8–4.9)
Alkaline Phosphatase: 65 IU/L (ref 44–121)
BUN/Creatinine Ratio: 16 (ref 9–20)
BUN: 25 mg/dL — ABNORMAL HIGH (ref 6–24)
Bilirubin Total: 0.4 mg/dL (ref 0.0–1.2)
CO2: 23 mmol/L (ref 20–29)
Calcium: 9.7 mg/dL (ref 8.7–10.2)
Chloride: 102 mmol/L (ref 96–106)
Creatinine, Ser: 1.6 mg/dL — ABNORMAL HIGH (ref 0.76–1.27)
Globulin, Total: 2.3 g/dL (ref 1.5–4.5)
Glucose: 92 mg/dL (ref 70–99)
Potassium: 4.5 mmol/L (ref 3.5–5.2)
Sodium: 140 mmol/L (ref 134–144)
Total Protein: 6.8 g/dL (ref 6.0–8.5)
eGFR: 50 mL/min/{1.73_m2} — ABNORMAL LOW (ref >59)

## 2021-10-03 LAB — LIPID PANEL
Chol/HDL Ratio: 3.8 ratio (ref 0.0–5.0)
Cholesterol, Total: 189 mg/dL (ref 100–199)
HDL: 50 mg/dL (ref >39)
LDL Chol Calc (NIH): 122 mg/dL — ABNORMAL HIGH (ref 0–99)
Triglycerides: 93 mg/dL (ref 0–149)
VLDL Cholesterol Cal: 17 mg/dL (ref 5–40)

## 2021-10-03 LAB — PSA: Prostate Specific Ag, Serum: 0.3 ng/mL (ref 0.0–4.0)

## 2021-10-09 ENCOUNTER — Ambulatory Visit (INDEPENDENT_AMBULATORY_CARE_PROVIDER_SITE_OTHER): Payer: Self-pay | Admitting: Internal Medicine

## 2021-10-09 ENCOUNTER — Encounter: Payer: Self-pay | Admitting: Internal Medicine

## 2021-10-09 ENCOUNTER — Other Ambulatory Visit: Payer: Self-pay

## 2021-10-09 VITALS — BP 134/82 | HR 67 | Resp 18 | Ht 72.0 in | Wt 287.1 lb

## 2021-10-09 DIAGNOSIS — N1831 Chronic kidney disease, stage 3a: Secondary | ICD-10-CM

## 2021-10-09 DIAGNOSIS — Z2821 Immunization not carried out because of patient refusal: Secondary | ICD-10-CM

## 2021-10-09 DIAGNOSIS — I10 Essential (primary) hypertension: Secondary | ICD-10-CM

## 2021-10-09 DIAGNOSIS — J309 Allergic rhinitis, unspecified: Secondary | ICD-10-CM

## 2021-10-09 MED ORDER — FLUTICASONE PROPIONATE 50 MCG/ACT NA SUSP: 2.0000 | Freq: Every day | NASAL | 6 refills | Status: AC

## 2021-10-09 MED ORDER — LISINOPRIL 5 MG PO TABS: 5.0000 mg | ORAL_TABLET | Freq: Every day | ORAL | 1 refills | Status: DC

## 2021-10-09 NOTE — Patient Instructions (Signed)
Please continue taking medications as prescribed.  Please continue to follow low salt diet and perform moderate exercise/walking at least 150 mins/week.  Please maintain adequate hydration by taking at least 64 ounces of fluid in a day.

## 2021-10-10 DIAGNOSIS — J309 Allergic rhinitis, unspecified: Secondary | ICD-10-CM | POA: Insufficient documentation

## 2021-10-10 NOTE — Assessment & Plan Note (Signed)
BP Readings from Last 1 Encounters:  10/09/21 134/82   Well-controlled with Lisinopril 5 mg QD Counseled for compliance with the medications Advised DASH diet and moderate exercise/walking, at least 150 mins/week

## 2021-10-10 NOTE — Progress Notes (Signed)
Established Patient Office Visit  Subjective:  Patient ID: Jackson Figueroa, male    DOB: 05-Dec-1960  Age: 60 y.o. MRN: [id number]  CC:  Chief Complaint  Patient presents with   Annual Exam    Annual exam pt having lower back pain since last week still has lingering cough from covid been dealing with a runny nose the last two weeks    HPI Jackson Figueroa is a 60 y.o. male with past medical history of HTN and CKD stage 3a who presents for f/u of HTN and CKD.  HTN: BP is well-controlled. Takes medications regularly. Patient denies headache, dizziness, chest pain, dyspnea or palpitations.  CKD stage 3a: His serum creatinine was 1.6 and GFR was 50.  He is serum creatinine has been elevated since prolonged hospitalization for COVID.  He needs to improve fluid intake as well.  Does not report taking any NSAIDs currently.  He is on lisinopril for HTN now.  He denies any dysuria, hematuria or urinary hesitancy or resistance.  Denies any pelvic or flank pain currently.  He c/o nasal congestion, which chronic and seasonal. Denies any fever, chills, sore throat, cough or dyspnea.  He has started working now, mostly remote work from home.  He still has mild weakness of RUE, but is slowly getting better.  He has completed PT now.  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    Tendinitis of elbow    right   Urinary incontinence 03/07/2021    Past Surgical History:  Procedure Laterality Date   APPENDECTOMY  1982   COLONOSCOPY WITH PROPOFOL N/A 06/18/2021   Procedure:  COLONOSCOPY WITH PROPOFOL;  Surgeon: Harvel Quale, MD;  [facility];  Service: Gastroenterology;  Laterality: N/A;  9:15   HERNIA REPAIR N/A    Phreesia 03/03/2021   INGUINAL HERNIA REPAIR Left 2000   IR GASTROSTOMY TUBE MOD SED  11/21/2020   IR GASTROSTOMY TUBE REMOVAL  01/17/2021   KNEE ARTHROSCOPY     bilateral knees   POLYPECTOMY  06/18/2021   Procedure: POLYPECTOMY INTESTINAL;  Surgeon: Harvel Quale, MD;  [facility];  Service: Gastroenterology;;   TONSILLECTOMY AND ADENOIDECTOMY     TRACHEOSTOMY TUBE PLACEMENT N/A 11/14/2020   Procedure: TRACHEOSTOMY;  Surgeon: Melida Quitter, MD;  Location: [facility];  Service: ENT;  Laterality: N/A;    Family History  Problem Relation Age of Onset   Cerebral aneurysm Mother 91   Heart disease Father    Migraines Sister     Social History   Socioeconomic History   Marital status: Significant Other    Spouse name: Not on file   Number of children: Not on file   Years of education: Not on file   Highest education level: Not on file  Occupational History   Not on file  Tobacco Use   Smoking status: Former    Packs/day: 2.00    Types: Cigarettes    Quit date: 03/23/2012  Years since quitting: 9.5   Smokeless tobacco: Never  Vaping Use   Vaping Use: Never used  Substance and Sexual Activity   Alcohol use: Yes    Alcohol/week: 2.0 standard drinks    Types: 2 Glasses of wine per week    Comment: occasional wine   Drug use: Never   Sexual activity: Not on file  Other Topics Concern   Not on file  Social History Narrative   Not on file   Social Determinants of Health   Financial Resource Strain: Not on file  Food Insecurity: Not on file  Transportation Needs: Not on file  Physical Activity: Not on file  Stress: Not on file  Social Connections: Not on file  Intimate Partner Violence: Not on file    Outpatient Medications Prior to Visit  Medication Sig Dispense Refill    acetaminophen (TYLENOL) 325 MG tablet Take 1-2 tablets (325-650 mg total) by mouth every 4 (four) hours as needed for mild pain.     Ascorbic Acid (VITAMIN C) 1000 MG tablet Take 1,000 mg by mouth daily.     Capsaicin 0.025 % LOTN Apply 1 application topically daily. 113.2 mL 1   Cholecalciferol (VITAMIN D3) 125 MCG (5000 UT) CAPS Take 5,000 Units by mouth daily.     diclofenac Sodium (VOLTAREN) 1 % GEL Apply 2 g topically 4 (four) times daily. (Patient taking differently: Apply 2 g topically 4 (four) times daily as needed (pain).) 350 g 0   diphenhydrAMINE (BENADRYL) 25 MG tablet Take 25 mg by mouth at bedtime.     famotidine (PEPCID) 10 MG tablet Take 10 mg by mouth daily.     Misc Natural Products (GINSENG COMPLEX PO) Take 1,000 mg by mouth daily.     Multiple Vitamin (MULTIVITAMIN WITH MINERALS) TABS tablet Take 1 tablet by mouth daily.     phenylephrine (SUDAFED PE) 10 MG TABS tablet Take 10 mg by mouth every 4 (four) hours as needed (congestion).     pyridoxine (B-6) 100 MG tablet Take 100 mg by mouth daily.     traMADol (ULTRAM) 50 MG tablet Take 1 tablet (50 mg total) by mouth every 6 (six) hours as needed for severe pain. Not sure long term med at this time 120 tablet 1   zinc gluconate 50 MG tablet Take 50 mg by mouth daily.     lisinopril (ZESTRIL) 5 MG tablet Take 1 tablet (5 mg total) by mouth daily. 90 tablet 0   oxyCODONE-acetaminophen (PERCOCET) 10-325 MG tablet Take 1 tablet by mouth every 6 (six) hours as needed.     traZODone (DESYREL) 50 MG tablet Take 1 tablet (50 mg total) by mouth at bedtime. (Patient not taking: No sig reported) 30 tablet 5   No facility-administered medications prior to visit.    No Known Allergies  ROS Review of Systems  Constitutional:  Negative for chills and fever.  HENT:  Negative for congestion and sore throat.   Eyes:  Negative for pain and discharge.  Respiratory:  Negative for cough and shortness of breath.   Cardiovascular:  Negative for  chest pain and palpitations.  Gastrointestinal:  Negative for constipation, diarrhea, nausea and vomiting.  Endocrine: Negative for polydipsia and polyuria.  Genitourinary:  Negative for dysuria and hematuria.  Musculoskeletal:  Positive for arthralgias and back pain. Negative for neck pain and neck stiffness.  Skin:  Negative for rash.  Neurological:  Positive for weakness. Negative for dizziness, numbness and headaches.  Psychiatric/Behavioral:  Negative for  agitation and behavioral problems.      Objective:    Physical Exam Vitals reviewed.  Constitutional:      General: He is not in acute distress.    Appearance: He is not diaphoretic.  HENT:     Head: Normocephalic and atraumatic.     Nose: Nose normal.     Mouth/Throat:     Mouth: Mucous membranes are moist.  Eyes:     General: No scleral icterus.    Extraocular Movements: Extraocular movements intact.  Cardiovascular:     Rate and Rhythm: Normal rate and regular rhythm.     Pulses: Normal pulses.     Heart sounds: Normal heart sounds. No murmur heard. Pulmonary:     Breath sounds: Normal breath sounds. No wheezing or rales.  Abdominal:     Palpations: Abdomen is soft.     Tenderness: There is no abdominal tenderness.  Musculoskeletal:     Cervical back: Neck supple. No tenderness.     Right lower leg: No edema.     Left lower leg: No edema.  Skin:    General: Skin is warm.     Findings: No rash.  Neurological:     General: No focal deficit present.     Mental Status: He is alert and oriented to person, place, and time.     Sensory: No sensory deficit.     Motor: Weakness (4/5 in RUE) present.  Psychiatric:        Mood and Affect: Mood normal.        Behavior: Behavior normal.    BP 134/82 (BP Location: Left Arm, Patient Position: Sitting, Cuff Size: Normal)   Pulse 67   Resp 18   Ht 6' (1.829 m)   Wt 287 lb 1.9 oz (130.2 kg)   SpO2 94%   BMI 38.94 kg/m  Wt Readings from Last 3 Encounters:  10/09/21  287 lb 1.9 oz (130.2 kg)  07/31/21 297 lb (134.7 kg)  07/18/21 294 lb (133.4 kg)    Lab Results  Component Value Date   TSH 1.520 05/29/2021   Lab Results  Component Value Date   WBC 4.6 10/02/2021   HGB 14.0 10/02/2021   HCT 40.5 10/02/2021   MCV 97 10/02/2021   PLT 212 10/02/2021   Lab Results  Component Value Date   NA 140 10/02/2021   K 4.5 10/02/2021   CO2 23 10/02/2021   GLUCOSE 92 10/02/2021   BUN 25 (H) 10/02/2021   CREATININE 1.60 (H) 10/02/2021   BILITOT 0.4 10/02/2021   ALKPHOS 65 10/02/2021   AST 21 10/02/2021   ALT 24 10/02/2021   PROT 6.8 10/02/2021   ALBUMIN 4.5 10/02/2021   CALCIUM 9.7 10/02/2021   ANIONGAP 10 01/29/2021   EGFR 50 (L) 10/02/2021   Lab Results  Component Value Date   CHOL 189 10/02/2021   Lab Results  Component Value Date   HDL 50 10/02/2021   Lab Results  Component Value Date   LDLCALC 122 (H) 10/02/2021   Lab Results  Component Value Date   TRIG 93 10/02/2021   Lab Results  Component Value Date   CHOLHDL 3.8 10/02/2021   Lab Results  Component Value Date   HGBA1C 5.1 05/29/2021      Assessment & Plan:   Problem List Items Addressed This Visit       Cardiovascular and Mediastinum   Essential hypertension - Primary    BP Readings from Last 1 Encounters:  10/09/21 134/82  Well-controlled with Lisinopril 5 mg QD Counseled for compliance with the medications Advised DASH diet and moderate exercise/walking, at least 150 mins/week       Relevant Medications   lisinopril (ZESTRIL) 5 MG tablet     Respiratory   Allergic sinusitis    Started Flonase Can take Zyrtec as needed for seasonal allergies      Relevant Medications   fluticasone (FLONASE) 50 MCG/ACT nasal spray     Genitourinary   Stage 3a chronic kidney disease ([place])    Rise in [place] since hospitalization for COVID Check CMP Avoid nephrotoxic agents Started Lisinopril for HTN. Check UA in the next visit      Relevant Orders   CMP14+EGFR    Urinalysis   Other Visit Diagnoses     Refused influenza vaccine           Meds ordered this encounter  Medications   fluticasone (FLONASE) 50 MCG/ACT nasal spray    Sig: Place 2 sprays into both nostrils daily.    Dispense:  16 g    Refill:  6   lisinopril (ZESTRIL) 5 MG tablet    Sig: Take 1 tablet (5 mg total) by mouth daily.    Dispense:  90 tablet    Refill:  1    Follow-up: Return in about 4 months (around 02/06/2022) for HTN and CKD.    Lindell Spar, MD

## 2021-10-10 NOTE — Assessment & Plan Note (Signed)
Started Flonase Can take Zyrtec as needed for seasonal allergies

## 2021-10-10 NOTE — Assessment & Plan Note (Signed)
Rise in [place] since hospitalization for COVID Check CMP Avoid nephrotoxic agents Started Lisinopril for HTN. Check UA in the next visit

## 2021-10-20 ENCOUNTER — Encounter: Payer: BC Managed Care – PPO | Admitting: Physical Medicine and Rehabilitation

## 2021-11-18 ENCOUNTER — Encounter (HOSPITAL_COMMUNITY): Payer: Self-pay | Admitting: Physical Therapy

## 2021-11-18 NOTE — Therapy (Signed)
[facility] [place], [place], [address] Phone: [phone]   Fax:  [phone]  Patient Details  Name: Jackson Figueroa MRN: [id number] Date of Birth: 16-Sep-1961 Referring Provider:  No ref. provider found  Encounter Date: 11/18/2021   PHYSICAL THERAPY DISCHARGE SUMMARY  Visits from Start of Care: 9  Current functional level related to goals / functional outcomes: PT improving completing steps at home.   Remaining deficits: Unsure as pt did not return     Education / Equipment: HEP   Patient agrees to discharge. Patient goals were  unknown . Patient is being discharged due to not returning since the last visit.  Rayetta Humphrey, PT CLT [phone]   11/18/2021, 9:43 AM  [facility] [address] [place], [place], [address] Phone: [phone]   Fax:  [phone]

## 2021-12-31 ENCOUNTER — Other Ambulatory Visit: Payer: Self-pay

## 2021-12-31 ENCOUNTER — Encounter
Payer: Managed Care, Other (non HMO) | Attending: Physical Medicine and Rehabilitation | Admitting: Physical Medicine and Rehabilitation

## 2021-12-31 ENCOUNTER — Encounter: Payer: Self-pay | Admitting: Physical Medicine and Rehabilitation

## 2021-12-31 VITALS — BP 131/90 | HR 75 | Temp 98.6°F | Ht 72.0 in | Wt 293.0 lb

## 2021-12-31 DIAGNOSIS — G5621 Lesion of ulnar nerve, right upper limb: Secondary | ICD-10-CM | POA: Insufficient documentation

## 2021-12-31 DIAGNOSIS — S83207S Unspecified tear of unspecified meniscus, current injury, left knee, sequela: Secondary | ICD-10-CM | POA: Insufficient documentation

## 2021-12-31 DIAGNOSIS — M21372 Foot drop, left foot: Secondary | ICD-10-CM | POA: Diagnosis not present

## 2021-12-31 DIAGNOSIS — U099 Post covid-19 condition, unspecified: Secondary | ICD-10-CM | POA: Insufficient documentation

## 2021-12-31 NOTE — Progress Notes (Signed)
Subjective:    Patient ID: Jackson Figueroa, male    DOB: 29-Jun-1961, 61 y.o.   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 Maybe August for R carpal tunnel syndrome surgery and elbow release for ulnar neuropathy.    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   Didn't try Capsacin lotion for pain.   Last 2-3 weeks, pain has been better- L knee and R hand/ulnar neuropathy.   Still has difficulty with typing, ,but getting better.   Pain is more numb/tingling-not really pain.   Missing bowling- but hand surgeon suggested lighter ball.  Usually uses a 15 lb ball.    Still has nerve issues from L foot- if not hydrated enough or too much wine- or energy drink- makes his L foot tingle.   L hand tingling has resolved.   Trying to do exercises- Stretches q1 hour and walk around- gets off computer.  After 1 year, still unpacking.   Not doing HEP specifically.  On average gets 3000 steps/day.   Doesn't feel like still weak- at least not more than was when got sick.   BMI 39.74- and told needs to lose weight.   Still has COVID cough- brain back to baseline- no brain fog- still gets fatigued easier. Ex: doing yard work and had to take a break  [employer] and had to deal with that.    Pain Inventory Average Pain 1 Pain Right Now 1 My pain is intermittent, dull, and aching  In the last 24 hours, has pain interfered with the following? General activity 1 Relation with others 0 Enjoyment of life 1 What TIME of day is your pain at its worst? daytime Sleep (in general) Fair  Pain is worse with: sitting and inactivity Pain improves with: therapy/exercise Relief from Meds:  not taking pain medications  Family History  Problem Relation Age of Onset   Cerebral  aneurysm Mother 64   Heart disease Father    Migraines Sister    Social History   Socioeconomic History   Marital status: Significant Other    Spouse name: Not on file   Number of children: Not on file   Years of education: Not on file   Highest education level: Not on file  Occupational History   Not on file  Tobacco Use   Smoking status: Former    Packs/day: 2.00    Types: Cigarettes    Quit date: 03/23/2012    Years since quitting: 9.7   Smokeless tobacco: Never  Vaping Use   Vaping Use: Never used  Substance and Sexual Activity   Alcohol use: Yes    Alcohol/week: 2.0 standard drinks    Types: 2 Glasses of wine per week    Comment: occasional wine   Drug use: Never   Sexual activity: Not on file  Other Topics Concern   Not on file  Social History Narrative   Not on file   Social Determinants of Health   Financial Resource Strain: Not on file  Food Insecurity: Not on file  Transportation Needs: Not on file  Physical Activity: Not on file  Stress: Not on file  Social Connections: Not on file   Past Surgical History:  Procedure Laterality Date   APPENDECTOMY  1982   COLONOSCOPY WITH PROPOFOL N/A 06/18/2021   Procedure: COLONOSCOPY WITH PROPOFOL;  Surgeon: Harvel Quale, MD;  [facility];  Service: Gastroenterology;  Laterality: N/A;  9:15   HERNIA REPAIR N/A    [employer]reesia 03/03/2021   INGUINAL HERNIA REPAIR Left 2000   IR GASTROSTOMY TUBE MOD SED  11/21/2020   IR GASTROSTOMY TUBE REMOVAL  01/17/2021   KNEE ARTHROSCOPY     bilateral knees   POLYPECTOMY  06/18/2021   Procedure: POLYPECTOMY INTESTINAL;  Surgeon: Harvel Quale, MD;  [facility];  Service: Gastroenterology;;   TONSILLECTOMY AND ADENOIDECTOMY     TRACHEOSTOMY TUBE PLACEMENT N/A 11/14/2020   Procedure: TRACHEOSTOMY;  Surgeon: Melida Quitter, MD;  [facility];  Service: ENT;  Laterality: N/A;   Past Surgical History:  Procedure Laterality Date    APPENDECTOMY  1982   COLONOSCOPY WITH PROPOFOL N/A 06/18/2021   Procedure: COLONOSCOPY WITH PROPOFOL;  Surgeon: Harvel Quale, MD;  [facility];  Service: Gastroenterology;  Laterality: N/A;  9:15   HERNIA REPAIR N/A    Phreesia 03/03/2021   INGUINAL HERNIA REPAIR Left 2000   IR GASTROSTOMY TUBE MOD SED  11/21/2020   IR GASTROSTOMY TUBE REMOVAL  01/17/2021   KNEE ARTHROSCOPY     bilateral knees   POLYPECTOMY  06/18/2021   Procedure: POLYPECTOMY INTESTINAL;  Surgeon: Harvel Quale, MD;  [facility];  Service: Gastroenterology;;   TONSILLECTOMY AND ADENOIDECTOMY     TRACHEOSTOMY TUBE PLACEMENT N/A 11/14/2020   Procedure: TRACHEOSTOMY;  Surgeon: Melida Quitter, MD;  [facility];  Service: ENT;  Laterality: N/A;   Past Medical History:  Diagnosis Date   Acute respiratory distress syndrome (ARDS) due to COVID-19 virus ([place])    Allergic    Dysphagia    GERD (gastroesophageal reflux disease)    Phreesia 03/03/2021   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    Tendinitis of elbow    right   Urinary incontinence 03/07/2021   There were no vitals taken for this visit.  Opioid Risk Score:   Fall Risk Score:  `1  Depression screen PHQ 2/9  Depression screen [facility] 2/9 10/09/2021 07/15/2021 03/06/2021 03/05/2021  Decreased Interest 0 0 0 0  Down, Depressed, Hopeless 0 0 0 0  PHQ - 2 Score 0 0 0 0  Altered sleeping - - - 2  Tired, decreased energy - - - 1  Change in appetite - - - 0  Feeling bad or failure about yourself  - - - 0  Trouble concentrating - - - 0  Moving slowly or fidgety/restless - - - 0  Suicidal thoughts - - - 0  PHQ-9 Score - - - 3  Difficult doing work/chores - - Not difficult at all Somewhat difficult     Review of Systems  Musculoskeletal:        Right hand and left knee  All other systems reviewed and are negative.     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   2.  Monitor for fatigue, because can drag L foot a little when does so and puts you at risk for fall.   3. Back to work- doesn't need long term disability at this time.   4. F/U- prn- call if you need me.   5. Goal goal 5000 steps/day. And then can aim for 8000 steps/day after that.   I spent a total of  21  minutes on total care today- >50% coordination of care- due to d/w pt about long COVID, f/u and and step goals.

## 2021-12-31 NOTE — Patient Instructions (Signed)
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   2.  Monitor for fatigue, because can drag L foot a little when does so and puts you at risk for fall.   3. Back to work- doesn't need long term disability at this time.   4. F/U- prn- call if you need me.   5. Goal goal 5000 steps/day. And then can aim for 8000 steps/day after that.

## 2022-01-21 ENCOUNTER — Encounter: Payer: Self-pay | Admitting: *Deleted

## 2022-01-21 DIAGNOSIS — Z2821 Immunization not carried out because of patient refusal: Secondary | ICD-10-CM | POA: Insufficient documentation

## 2022-02-03 LAB — URINALYSIS
Bilirubin, UA: NEGATIVE
Glucose, UA: NEGATIVE
Ketones, UA: NEGATIVE
Leukocytes,UA: NEGATIVE
Nitrite, UA: NEGATIVE
Protein,UA: NEGATIVE
RBC, UA: NEGATIVE
Specific Gravity, UA: 1.014 (ref 1.005–1.030)
Urobilinogen, Ur: 0.2 mg/dL (ref 0.2–1.0)
pH, UA: 6 (ref 5.0–7.5)

## 2022-02-03 LAB — CMP14+EGFR
ALT: 24 IU/L (ref 0–44)
AST: 20 IU/L (ref 0–40)
Albumin/Globulin Ratio: 2.3 — ABNORMAL HIGH (ref 1.2–2.2)
Albumin: 4.1 g/dL (ref 3.8–4.9)
Alkaline Phosphatase: 65 IU/L (ref 44–121)
BUN/Creatinine Ratio: 13 (ref 9–20)
BUN: 18 mg/dL (ref 6–24)
Bilirubin Total: 0.4 mg/dL (ref 0.0–1.2)
CO2: 24 mmol/L (ref 20–29)
Calcium: 9.5 mg/dL (ref 8.7–10.2)
Chloride: 102 mmol/L (ref 96–106)
Creatinine, Ser: 1.44 mg/dL — ABNORMAL HIGH (ref 0.76–1.27)
Globulin, Total: 1.8 g/dL (ref 1.5–4.5)
Glucose: 86 mg/dL (ref 70–99)
Potassium: 4.4 mmol/L (ref 3.5–5.2)
Sodium: 140 mmol/L (ref 134–144)
Total Protein: 5.9 g/dL — ABNORMAL LOW (ref 6.0–8.5)
eGFR: 56 mL/min/{1.73_m2} — ABNORMAL LOW (ref >59)

## 2022-02-09 ENCOUNTER — Other Ambulatory Visit: Payer: Self-pay

## 2022-02-09 ENCOUNTER — Encounter: Payer: Self-pay | Admitting: Internal Medicine

## 2022-02-09 ENCOUNTER — Ambulatory Visit: Payer: Managed Care, Other (non HMO) | Admitting: Internal Medicine

## 2022-02-09 VITALS — BP 124/82 | HR 98 | Resp 18 | Ht 72.0 in | Wt 299.2 lb

## 2022-02-09 DIAGNOSIS — S46812A Strain of other muscles, fascia and tendons at shoulder and upper arm level, left arm, initial encounter: Secondary | ICD-10-CM | POA: Diagnosis not present

## 2022-02-09 DIAGNOSIS — N1831 Chronic kidney disease, stage 3a: Secondary | ICD-10-CM

## 2022-02-09 DIAGNOSIS — J309 Allergic rhinitis, unspecified: Secondary | ICD-10-CM

## 2022-02-09 DIAGNOSIS — I10 Essential (primary) hypertension: Secondary | ICD-10-CM

## 2022-02-09 MED ORDER — CYCLOBENZAPRINE HCL 5 MG PO TABS: 5.0000 mg | ORAL_TABLET | Freq: Three times a day (TID) | ORAL | 1 refills | Status: DC | PRN

## 2022-02-09 NOTE — Assessment & Plan Note (Signed)
Left sided neck pain likely due to trapezius strain ?Advised to perform simple neck exercises ?Flexeril PRN for muscle spasms ?Tylenol PRN for pain ?

## 2022-02-09 NOTE — Assessment & Plan Note (Signed)
BP Readings from Last 1 Encounters:  ?02/09/22 124/82  ? ?Well-controlled with Lisinopril 5 mg QD ?Counseled for compliance with the medications ?Advised DASH diet and moderate exercise/walking, at least 150 mins/week ?

## 2022-02-09 NOTE — Assessment & Plan Note (Signed)
Had started Flonase, needs to use it ?Claritin as needed for seasonal allergies ?

## 2022-02-09 NOTE — Progress Notes (Signed)
? ?Established Patient Office Visit ? ?Subjective:  ?Patient ID: Jackson Figueroa, male    DOB: Dec 22, 1960  Age: 61 y.o. MRN: [id number] ? ?CC:  ?Chief Complaint  ?Patient presents with  ? Follow-up  ?  4 month follow up HTN and CKD has kink in neck for about a week also has nasal congestion for about 3 weeks   ? ? ?HPI ?Jackson Figueroa is a 61 y.o. male with past medical history of HTN and CKD stage 3a who presents for f/u of his chronic medical conditions. ? ?HTN: BP is well-controlled. Takes medications regularly. Patient denies headache, dizziness, chest pain, dyspnea or palpitations. ?  ?CKD stage 3a: His serum creatinine was 1.44 and GFR was 56, improved from prior. His serum creatinine has been elevated since prolonged hospitalization for COVID.  He needs to improve fluid intake as well.  Does not report taking any NSAIDs currently.  He is on lisinopril for HTN now.  He denies any dysuria, hematuria or urinary hesitancy or resistance.  Denies any pelvic or flank pain currently. ? ?He c/o nasal congestion, which is chronic and seasonal. Denies any  ?fever, chills, sore throat, cough or dyspnea. He takes OTC Claritin, but admits that he forgets using Flonase. ? ?He c/o left sided neck pain for about a week now, denies any recent injury. He has had similar pain in the past. He has tried taking Tylenol and doing some massage, which resolve the pain. ? ? ?Past Medical History:  ?Diagnosis Date  ? Acute respiratory distress syndrome (ARDS) due to COVID-19 virus [facility])   ? Allergic   ? Dysphagia   ? GERD (gastroesophageal reflux disease)   ? [employer] 03/03/2021  ? 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 Knee pain, bilateral   ? due to old injuires  ? Numbness and tingling in right hand   ? and elbow due to sports injury  ? Obesity   ? Staph infection   ? of appy wound--treated with antibiotics X 4 weeks.   ? Tendinitis of elbow   ? right  ? Urinary incontinence 03/07/2021  ? ? ?Past Surgical History:   ?Procedure Laterality Date  ? APPENDECTOMY  1982  ? COLONOSCOPY WITH PROPOFOL N/A 06/18/2021  ? Procedure: COLONOSCOPY WITH PROPOFOL;  Surgeon: Harvel Quale, MD;  [facility];  Service: Gastroenterology;  Laterality: N/A;  9:15  ? HERNIA REPAIR N/A   ? [employer] 03/03/2021  ? INGUINAL HERNIA REPAIR Left 2000  ? IR GASTROSTOMY TUBE MOD SED  11/21/2020  ? IR GASTROSTOMY TUBE REMOVAL  01/17/2021  ? KNEE ARTHROSCOPY    ? bilateral knees  ? POLYPECTOMY  06/18/2021  ? Procedure: POLYPECTOMY INTESTINAL;  Surgeon: Harvel Quale, MD;  Location: [facility];  Service: Gastroenterology;;  ? TONSILLECTOMY AND ADENOIDECTOMY    ? TRACHEOSTOMY TUBE PLACEMENT N/A 11/14/2020  ? Procedure: TRACHEOSTOMY;  Surgeon: Melida Quitter, MD;  Location: [facility];  Service: ENT;  Laterality: N/A;  ? ? ?Family History  ?Problem Relation Age of Onset  ? Cerebral aneurysm Mother 46  ? Heart disease Father   ? Migraines Sister   ? ? ?Social History  ? ?Socioeconomic History  ? Marital status: Significant Other  ?  Spouse name: Not on file  ? Number of children: Not on file  ? Years of education: Not on file  ? Highest education level: Not on file  ?Occupational History  ? Not on file  ?Tobacco Use  ?  Smoking status: Former  ?  Packs/day: 2.00  ?  Types: Cigarettes  ?  Quit date: 03/23/2012  ?  Years since quitting: 9.8  ? Smokeless tobacco: Never  ?Vaping Use  ? Vaping Use: Never used  ?Substance and Sexual Activity  ? Alcohol use: Yes  ?  Alcohol/week: 2.0 standard drinks  ?  Types: 2 Glasses of wine per week  ?  Comment: occasional wine  ? Drug use: Never  ? Sexual activity: Not on file  ?Other Topics Concern  ? Not on file  ?Social History Narrative  ? Not on file  ? ?Social Determinants of Health  ? ?Financial Resource Strain: Not on file  ?Food Insecurity: Not on file  ?Transportation Needs: Not on file  ?Physical Activity: Not on file  ?Stress: Not on file  ?Social Connections: Not on file  ?Intimate Partner  Violence: Not on file  ? ? ?Outpatient Medications Prior to Visit  ?Medication Sig Dispense Refill  ? acetaminophen (TYLENOL) 325 MG tablet Take 1-2 tablets (325-650 mg total) by mouth every 4 (four) hours as needed for mild pain.    ? Ascorbic Acid (VITAMIN C) 1000 MG tablet Take 1,000 mg by mouth daily.    ? Capsaicin 0.025 % LOTN Apply 1 application topically daily. 113.2 mL 1  ? Cholecalciferol (VITAMIN D3) 125 MCG (5000 UT) CAPS Take 5,000 Units by mouth daily.    ? diclofenac Sodium (VOLTAREN) 1 % GEL Apply 2 g topically 4 (four) times daily. (Patient taking differently: Apply 2 g topically 4 (four) times daily as needed (pain).) 350 g 0  ? diphenhydrAMINE (BENADRYL) 25 MG tablet Take 25 mg by mouth at bedtime.    ? famotidine (PEPCID) 10 MG tablet Take 10 mg by mouth daily.    ? fluticasone (FLONASE) 50 MCG/ACT nasal spray Place 2 sprays into both nostrils daily. 16 g 6  ? lisinopril (ZESTRIL) 5 MG tablet Take 1 tablet (5 mg total) by mouth daily. 90 tablet 1  ? Misc Natural Products (GINSENG COMPLEX PO) Take 1,000 mg by mouth daily.    ? Multiple Vitamin (MULTIVITAMIN WITH MINERALS) TABS tablet Take 1 tablet by mouth daily.    ? phenylephrine (SUDAFED PE) 10 MG TABS tablet Take 10 mg by mouth every 4 (four) hours as needed (congestion).    ? pyridoxine (B-6) 100 MG tablet Take 100 mg by mouth daily.    ? traMADol (ULTRAM) 50 MG tablet Take 1 tablet (50 mg total) by mouth every 6 (six) hours as needed for severe pain. Not sure long term med at this time 120 tablet 1  ? zinc gluconate 50 MG tablet Take 50 mg by mouth daily.    ? ?No facility-administered medications prior to visit.  ? ? ?No Known Allergies ? ?ROS ?Review of Systems  ?Constitutional:  Negative for chills and fever.  ?HENT:  Positive for congestion. Negative for sore throat.   ?Eyes:  Negative for pain and discharge.  ?Respiratory:  Negative for cough and shortness of breath.   ?Cardiovascular:  Negative for chest pain and palpitations.   ?Gastrointestinal:  Negative for diarrhea, nausea and vomiting.  ?Endocrine: Negative for polydipsia and polyuria.  ?Genitourinary:  Negative for dysuria and hematuria.  ?Musculoskeletal:  Positive for arthralgias, back pain and neck pain. Negative for neck stiffness.  ?Skin:  Negative for rash.  ?Neurological:  Positive for weakness. Negative for dizziness, numbness and headaches.  ?Psychiatric/Behavioral:  Negative for agitation and behavioral problems.   ? ?  ?  Objective:  ?  ?Physical Exam ?Vitals reviewed.  ?Constitutional:   ?   General: He is not in acute distress. ?   Appearance: He is not diaphoretic.  ?HENT:  ?   Head: Normocephalic and atraumatic.  ?   Nose: Nose normal.  ?   Mouth/Throat:  ?   Mouth: Mucous membranes are moist.  ?Eyes:  ?   General: No scleral icterus. ?   Extraocular Movements: Extraocular movements intact.  ?Neck:  ?   Comments: Lateral flexion painful ?Cardiovascular:  ?   Rate and Rhythm: Normal rate and regular rhythm.  ?   Pulses: Normal pulses.  ?   Heart sounds: Normal heart sounds. No murmur heard. ?Pulmonary:  ?   Breath sounds: Normal breath sounds. No wheezing or rales.  ?Musculoskeletal:  ?   Cervical back: Neck supple. No tenderness.  ?   Right lower leg: No edema.  ?   Left lower leg: No edema.  ?Skin: ?   General: Skin is warm.  ?   Findings: No rash.  ?Neurological:  ?   General: No focal deficit present.  ?   Mental Status: He is alert and oriented to person, place, and time.  ?   Sensory: No sensory deficit.  ?   Motor: Weakness (4/5 in RUE) present.  ?Psychiatric:     ?   Mood and Affect: Mood normal.     ?   Behavior: Behavior normal.  ? ? ?BP 124/82 (BP Location: Right Arm, Patient Position: Sitting, Cuff Size: Normal)   Pulse 98   Resp 18   Ht 6' (1.829 m)   Wt 299 lb 3.2 oz (135.7 kg)   SpO2 96%   BMI 40.58 kg/m?  ?Wt Readings from Last 3 Encounters:  ?02/09/22 299 lb 3.2 oz (135.7 kg)  ?12/31/21 293 lb (132.9 kg)  ?10/09/21 287 lb 1.9 oz (130.2 kg)   ? ? ?Lab Results  ?Component Value Date  ? TSH 1.520 05/29/2021  ? ?Lab Results  ?Component Value Date  ? WBC 4.6 10/02/2021  ? HGB 14.0 10/02/2021  ? HCT 40.5 10/02/2021  ? MCV 97 10/02/2021  ? PLT 212 10/02/2021  ? ?Lab Res

## 2022-02-09 NOTE — Patient Instructions (Signed)
Please continue to take medications as prescribed. ? ?Please use Flonase for allergic sinusitis. ? ?Please use heating pad and perform simple neck exercises for neck pain. ?

## 2022-02-09 NOTE — Assessment & Plan Note (Signed)
Rise in S. Cr. since hospitalization for COVID Last CMP improved Avoid nephrotoxic agents On Lisinopril for HTN Reviewed UA - no gross proteinuria 

## 2022-04-27 ENCOUNTER — Encounter: Payer: Self-pay | Admitting: Internal Medicine

## 2022-05-04 ENCOUNTER — Other Ambulatory Visit: Payer: Self-pay | Admitting: Internal Medicine

## 2022-05-04 DIAGNOSIS — I10 Essential (primary) hypertension: Secondary | ICD-10-CM

## 2022-07-16 ENCOUNTER — Other Ambulatory Visit: Payer: Self-pay | Admitting: Internal Medicine

## 2022-07-16 ENCOUNTER — Encounter: Payer: Self-pay | Admitting: Internal Medicine

## 2022-07-16 DIAGNOSIS — N1831 Chronic kidney disease, stage 3a: Secondary | ICD-10-CM

## 2022-07-16 DIAGNOSIS — Z125 Encounter for screening for malignant neoplasm of prostate: Secondary | ICD-10-CM

## 2022-07-16 DIAGNOSIS — E782 Mixed hyperlipidemia: Secondary | ICD-10-CM

## 2022-07-16 DIAGNOSIS — Z0001 Encounter for general adult medical examination with abnormal findings: Secondary | ICD-10-CM

## 2022-07-16 DIAGNOSIS — Z113 Encounter for screening for infections with a predominantly sexual mode of transmission: Secondary | ICD-10-CM

## 2022-07-16 DIAGNOSIS — R7303 Prediabetes: Secondary | ICD-10-CM

## 2022-07-16 DIAGNOSIS — I10 Essential (primary) hypertension: Secondary | ICD-10-CM

## 2022-08-07 ENCOUNTER — Encounter: Payer: Self-pay | Admitting: Internal Medicine

## 2022-08-07 ENCOUNTER — Ambulatory Visit (INDEPENDENT_AMBULATORY_CARE_PROVIDER_SITE_OTHER): Payer: Self-pay | Admitting: Internal Medicine

## 2022-08-07 VITALS — BP 118/84 | HR 69 | Resp 18 | Ht 72.0 in | Wt 295.6 lb

## 2022-08-07 DIAGNOSIS — N1831 Chronic kidney disease, stage 3a: Secondary | ICD-10-CM

## 2022-08-07 DIAGNOSIS — I10 Essential (primary) hypertension: Secondary | ICD-10-CM

## 2022-08-07 DIAGNOSIS — Z2821 Immunization not carried out because of patient refusal: Secondary | ICD-10-CM

## 2022-08-07 DIAGNOSIS — L304 Erythema intertrigo: Secondary | ICD-10-CM

## 2022-08-07 LAB — CBC WITH DIFFERENTIAL/PLATELET
Basophils Absolute: 0 10*3/uL (ref 0.0–0.2)
Basos: 1 %
EOS (ABSOLUTE): 0.3 10*3/uL (ref 0.0–0.4)
Eos: 8 %
Hematocrit: 43.1 % (ref 37.5–51.0)
Hemoglobin: 14.9 g/dL (ref 13.0–17.7)
Immature Grans (Abs): 0 10*3/uL (ref 0.0–0.1)
Immature Granulocytes: 0 %
Lymphocytes Absolute: 1.1 10*3/uL (ref 0.7–3.1)
Lymphs: 34 %
MCH: 33.9 pg — ABNORMAL HIGH (ref 26.6–33.0)
MCHC: 34.6 g/dL (ref 31.5–35.7)
MCV: 98 fL — ABNORMAL HIGH (ref 79–97)
Monocytes Absolute: 0.4 10*3/uL (ref 0.1–0.9)
Monocytes: 13 %
Neutrophils Absolute: 1.5 10*3/uL (ref 1.4–7.0)
Neutrophils: 44 %
Platelets: 197 10*3/uL (ref 150–450)
RBC: 4.39 x10E6/uL (ref 4.14–5.80)
RDW: 13 % (ref 11.6–15.4)
WBC: 3.4 10*3/uL (ref 3.4–10.8)

## 2022-08-07 LAB — CMP14+EGFR
ALT: 30 IU/L (ref 0–44)
AST: 25 IU/L (ref 0–40)
Albumin/Globulin Ratio: 1.8 (ref 1.2–2.2)
Albumin: 4.4 g/dL (ref 3.8–4.9)
Alkaline Phosphatase: 72 IU/L (ref 44–121)
BUN/Creatinine Ratio: 13 (ref 9–20)
BUN: 18 mg/dL (ref 6–24)
Bilirubin Total: 0.5 mg/dL (ref 0.0–1.2)
CO2: 24 mmol/L (ref 20–29)
Calcium: 9.7 mg/dL (ref 8.7–10.2)
Chloride: 102 mmol/L (ref 96–106)
Creatinine, Ser: 1.39 mg/dL — ABNORMAL HIGH (ref 0.76–1.27)
Globulin, Total: 2.4 g/dL (ref 1.5–4.5)
Glucose: 85 mg/dL (ref 70–99)
Potassium: 4.7 mmol/L (ref 3.5–5.2)
Sodium: 140 mmol/L (ref 134–144)
Total Protein: 6.8 g/dL (ref 6.0–8.5)
eGFR: 58 mL/min/{1.73_m2} — ABNORMAL LOW (ref >59)

## 2022-08-07 LAB — PSA: Prostate Specific Ag, Serum: 0.2 ng/mL (ref 0.0–4.0)

## 2022-08-07 LAB — LIPID PANEL
Chol/HDL Ratio: 4.2 ratio (ref 0.0–5.0)
Cholesterol, Total: 187 mg/dL (ref 100–199)
HDL: 45 mg/dL (ref >39)
LDL Chol Calc (NIH): 121 mg/dL — ABNORMAL HIGH (ref 0–99)
Triglycerides: 118 mg/dL (ref 0–149)
VLDL Cholesterol Cal: 21 mg/dL (ref 5–40)

## 2022-08-07 LAB — TSH: TSH: 1.91 u[IU]/mL (ref 0.450–4.500)

## 2022-08-07 LAB — HEMOGLOBIN A1C
Est. average glucose Bld gHb Est-mCnc: 94 mg/dL
Hgb A1c MFr Bld: 4.9 % (ref 4.8–5.6)

## 2022-08-07 LAB — VITAMIN D 25 HYDROXY (VIT D DEFICIENCY, FRACTURES): Vit D, 25-Hydroxy: 56.5 ng/mL (ref 30.0–100.0)

## 2022-08-07 LAB — RPR, QUANT+TP ABS (REFLEX)
Rapid Plasma Reagin, Quant: 1:1 {titer} — ABNORMAL HIGH
T Pallidum Abs: NONREACTIVE

## 2022-08-07 LAB — RPR: RPR Ser Ql: REACTIVE — AB

## 2022-08-07 MED ORDER — KETOCONAZOLE 2 % EX CREA: 1.0000 | TOPICAL_CREAM | Freq: Every day | CUTANEOUS | 0 refills | Status: DC

## 2022-08-07 MED ORDER — LISINOPRIL 5 MG PO TABS: 5.0000 mg | ORAL_TABLET | Freq: Every day | ORAL | 1 refills | Status: DC

## 2022-08-07 NOTE — Assessment & Plan Note (Signed)
BP Readings from Last 1 Encounters:  08/07/22 118/84   Well-controlled with Lisinopril 5 mg QD Counseled for compliance with the medications Advised DASH diet and moderate exercise/walking, at least 150 mins/week

## 2022-08-07 NOTE — Assessment & Plan Note (Signed)
Rise in S. Cr. since hospitalization for COVID Last CMP improved Avoid nephrotoxic agents On Lisinopril for HTN Reviewed UA - no gross proteinuria 

## 2022-08-07 NOTE — Patient Instructions (Signed)
Please continue taking Lisinopril as prescribed.  Please continue to follow low salt diet and perform moderate exercise/walking at least 150 mins/week.

## 2022-08-07 NOTE — Progress Notes (Signed)
Established Patient Office Visit  Subjective:  Patient ID: Jackson Figueroa, male    DOB: October 26, 1961  Age: 61 y.o. MRN: [id number]  CC:  Chief Complaint  Patient presents with   Follow-up    Discuss lab results right hand has a blister has not been taking lisinopril for 1 month    HPI Duglas Heier is a 61 y.o. male with past medical history of HTN and CKD stage 3a who presents for f/u of his chronic medical conditions.  HTN: BP is well-controlled. Takes medications regularly. Patient denies headache, dizziness, chest pain, dyspnea or palpitations.   CKD stage 3a: His serum creatinine was 1.39 and GFR was 58, improved from prior. His serum creatinine has been elevated since prolonged hospitalization for COVID.  He needs to improve fluid intake as well.  Does not report taking any NSAIDs currently.  He is on lisinopril for HTN now.  He denies any dysuria, hematuria or urinary hesitancy or resistance.  Denies any pelvic or flank pain currently.  His RPR was positive, but treponema Abs were negative.  He currently denies any penile rash or ulcer.  He has had chronic, recurrent erythema in the groin area, which improves with OTC antifungal powder.  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  He has optimally gain his strength.  His CMP has showed stable GFR.  His syphilis testing was negative for acute infection.  Paperwork has been filled out and given to the patient.  Past Medical History:  Diagnosis Date   Acute respiratory distress syndrome (ARDS) due to COVID-19 virus 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    Tendinitis of elbow    right   Urinary incontinence 03/07/2021    Past Surgical History:  Procedure Laterality Date   APPENDECTOMY  1982   COLONOSCOPY WITH PROPOFOL N/A 06/18/2021   Procedure: COLONOSCOPY WITH PROPOFOL;  Surgeon: Harvel Quale, MD;  [facility];  Service: Gastroenterology;  Laterality: N/A;  9:15   HERNIA REPAIR N/A    [employer] 03/03/2021   INGUINAL HERNIA REPAIR Left 2000   IR GASTROSTOMY TUBE MOD SED  11/21/2020   IR GASTROSTOMY TUBE REMOVAL  01/17/2021   KNEE ARTHROSCOPY     bilateral knees   POLYPECTOMY  06/18/2021   Procedure: POLYPECTOMY INTESTINAL;  Surgeon: Harvel Quale, MD;  [facility];  Service: Gastroenterology;;   TONSILLECTOMY AND ADENOIDECTOMY     TRACHEOSTOMY TUBE PLACEMENT N/A 11/14/2020   Procedure: TRACHEOSTOMY;  Surgeon: Melida Quitter, MD;  Location: [facility];  Service: ENT;  Laterality: N/A;    Family History  Problem Relation Age of Onset   Cerebral aneurysm Mother 3   Heart disease Father    Migraines Sister     Social History   Socioeconomic History   Marital status: Significant Other    Spouse name: Not on file   Number of children: Not on file   Years of education: Not on file   Highest education level: Not on file  Occupational  History   Not on file  Tobacco Use   Smoking status: Former    Packs/day: 2.00    Types: Cigarettes    Quit date: 03/23/2012    Years since quitting: 10.3   Smokeless tobacco: Never  Vaping Use   Vaping Use: Never used  Substance and Sexual Activity   Alcohol use: Yes    Alcohol/week: 2.0 standard drinks of alcohol    Types: 2 Glasses of wine per week    Comment: occasional wine   Drug use: Never   Sexual activity: Not on file  Other Topics Concern   Not on file  Social History Narrative   Not on file   Social Determinants of Health   Financial Resource Strain: Not on file  Food Insecurity: Not on file   Transportation Needs: Not on file  Physical Activity: Not on file  Stress: Not on file  Social Connections: Not on file  Intimate Partner Violence: Not on file    Outpatient Medications Prior to Visit  Medication Sig Dispense Refill   acetaminophen (TYLENOL) 325 MG tablet Take 1-2 tablets (325-650 mg total) by mouth every 4 (four) hours as needed for mild pain.     Ascorbic Acid (VITAMIN C) 1000 MG tablet Take 1,000 mg by mouth daily.     Capsaicin 0.025 % LOTN Apply 1 application topically daily. 113.2 mL 1   Cholecalciferol (VITAMIN D3) 125 MCG (5000 UT) CAPS Take 5,000 Units by mouth daily.     diclofenac Sodium (VOLTAREN) 1 % GEL Apply 2 g topically 4 (four) times daily. (Patient taking differently: Apply 2 g topically 4 (four) times daily as needed (pain).) 350 g 0   diphenhydrAMINE (BENADRYL) 25 MG tablet Take 25 mg by mouth at bedtime.     famotidine (PEPCID) 10 MG tablet Take 10 mg by mouth daily.     fluticasone (FLONASE) 50 MCG/ACT nasal spray Place 2 sprays into both nostrils daily. 16 g 6   Misc Natural Products (GINSENG COMPLEX PO) Take 1,000 mg by mouth daily.     Multiple Vitamin (MULTIVITAMIN WITH MINERALS) TABS tablet Take 1 tablet by mouth daily.     phenylephrine (SUDAFED PE) 10 MG TABS tablet Take 10 mg by mouth every 4 (four) hours as needed (congestion).     pyridoxine (B-6) 100 MG tablet Take 100 mg by mouth daily.     traMADol (ULTRAM) 50 MG tablet Take 1 tablet (50 mg total) by mouth every 6 (six) hours as needed for severe pain. Not sure long term med at this time 120 tablet 1   zinc gluconate 50 MG tablet Take 50 mg by mouth daily.     cyclobenzaprine (FLEXERIL) 5 MG tablet Take 1 tablet (5 mg total) by mouth 3 (three) times daily as needed for muscle spasms. 30 tablet 1   lisinopril (ZESTRIL) 5 MG tablet TAKE 1 TABLET(5 MG) BY MOUTH DAILY (Patient not taking: Reported on 08/07/2022) 90 tablet 1   No facility-administered medications prior to visit.    No  Known Allergies  ROS Review of Systems  Constitutional:  Negative for chills and fever.  HENT:  Negative for congestion and sore throat.   Eyes:  Negative for pain and discharge.  Respiratory:  Negative for cough and shortness of breath.   Cardiovascular:  Negative for chest pain and palpitations.  Gastrointestinal:  Negative for diarrhea, nausea and vomiting.  Endocrine: Negative for polydipsia and polyuria.  Genitourinary:  Negative for dysuria and hematuria.  Musculoskeletal:  Positive for arthralgias, back pain and neck pain. Negative for neck stiffness.  Skin:  Positive for rash.  Neurological:  Negative for dizziness, weakness, numbness and headaches.  Psychiatric/Behavioral:  Negative for agitation and behavioral problems.       Objective:    Physical Exam Vitals reviewed.  Constitutional:      General: He is not in acute distress.    Appearance: He is not diaphoretic.  HENT:     Head: Normocephalic and atraumatic.     Nose: Nose normal.     Mouth/Throat:     Mouth: Mucous membranes are moist.  Eyes:     General: No scleral icterus.    Extraocular Movements: Extraocular movements intact.  Cardiovascular:     Rate and Rhythm: Normal rate and regular rhythm.     Pulses: Normal pulses.     Heart sounds: Normal heart sounds. No murmur heard. Pulmonary:     Breath sounds: Normal breath sounds. No wheezing or rales.  Musculoskeletal:     Cervical back: Neck supple. No tenderness.     Right lower leg: No edema.     Left lower leg: No edema.  Skin:    General: Skin is warm.     Findings: Rash (Erythematous in the groin area) present.  Neurological:     General: No focal deficit present.     Mental Status: He is alert and oriented to person, place, and time.     Sensory: No sensory deficit.     Motor: No weakness.  Psychiatric:        Mood and Affect: Mood normal.        Behavior: Behavior normal.     BP 118/84 (BP Location: Right Arm, Patient Position:  Sitting, Cuff Size: Normal)   Pulse 69   Resp 18   Ht 6' (1.829 m)   Wt 295 lb 9.6 oz (134.1 kg)   SpO2 96%   BMI 40.09 kg/m  Wt Readings from Last 3 Encounters:  08/07/22 295 lb 9.6 oz (134.1 kg)  02/09/22 299 lb 3.2 oz (135.7 kg)  12/31/21 293 lb (132.9 kg)    Lab Results  Component Value Date   TSH 1.910 08/05/2022   Lab Results  Component Value Date   WBC 3.4 08/05/2022   HGB 14.9 08/05/2022   HCT 43.1 08/05/2022   MCV 98 (H) 08/05/2022   PLT 197 08/05/2022   Lab Results  Component Value Date   NA 140 08/05/2022   K 4.7 08/05/2022   CO2 24 08/05/2022   GLUCOSE 85 08/05/2022   BUN 18 08/05/2022   CREATININE 1.39 (H) 08/05/2022   BILITOT 0.5 08/05/2022   ALKPHOS 72 08/05/2022   AST 25 08/05/2022   ALT 30 08/05/2022   PROT 6.8 08/05/2022   ALBUMIN 4.4 08/05/2022   CALCIUM 9.7 08/05/2022   ANIONGAP 10 01/29/2021   EGFR 58 (L) 08/05/2022   Lab Results  Component Value Date   CHOL 187 08/05/2022   Lab Results  Component Value Date   HDL 45 08/05/2022   Lab Results  Component Value Date   LDLCALC 121 (H) 08/05/2022   Lab Results  Component Value Date   TRIG 118 08/05/2022   Lab Results  Component Value Date   CHOLHDL 4.2 08/05/2022   Lab Results  Component Value Date   HGBA1C 4.9 08/05/2022      Assessment & Plan:   Problem List Items Addressed This Visit       Cardiovascular and  Mediastinum   Essential hypertension - Primary    BP Readings from Last 1 Encounters:  08/07/22 118/84  Well-controlled with Lisinopril 5 mg QD Counseled for compliance with the medications Advised DASH diet and moderate exercise/walking, at least 150 mins/week      Relevant Medications   lisinopril (ZESTRIL) 5 MG tablet     Genitourinary   Stage 3a chronic kidney disease ([place])    Rise in S. Cr. since hospitalization for COVID Last CMP improved Avoid nephrotoxic agents On Lisinopril for HTN Reviewed UA - no gross proteinuria        Other   Refused  influenza vaccine   Other Visit Diagnoses     Intertrigo       Relevant Medications   ketoconazole (NIZORAL) 2 % cream       Meds ordered this encounter  Medications   lisinopril (ZESTRIL) 5 MG tablet    Sig: Take 1 tablet (5 mg total) by mouth daily.    Dispense:  90 tablet    Refill:  1   ketoconazole (NIZORAL) 2 % cream    Sig: Apply 1 Application topically daily.    Dispense:  15 g    Refill:  0    Follow-up: Return in about 3 months (around 11/06/2022).    Lindell Spar, MD

## 2022-08-12 ENCOUNTER — Encounter: Payer: Managed Care, Other (non HMO) | Admitting: Internal Medicine

## 2022-08-19 ENCOUNTER — Encounter: Payer: Self-pay | Admitting: Internal Medicine

## 2022-08-19 NOTE — Telephone Encounter (Signed)
I assume so

## 2022-08-19 NOTE — Telephone Encounter (Signed)
I dont have any here to be filled out please ask patient if he picked up or find out if maybe there were sent to scan

## 2022-08-25 ENCOUNTER — Telehealth: Payer: Self-pay | Admitting: *Deleted

## 2022-08-25 NOTE — Telephone Encounter (Signed)
Per Dr Posey Pronto we have received form from [facility] for pt plasma center wants dr patel to write that his kidney function is normal. Normal is very wide/vague medical term. He had written as he has recovered and stable. We cannot write that it is normal.  Dr Posey Pronto LVM 08/24/22

## 2022-09-01 ENCOUNTER — Telehealth: Payer: Self-pay | Admitting: Internal Medicine

## 2022-09-01 ENCOUNTER — Telehealth: Payer: Self-pay

## 2022-09-01 NOTE — Telephone Encounter (Signed)
Medical history assessment forms  Copied Noted Sleeved  Forms in front folder for December 2023 appt

## 2022-09-01 NOTE — Telephone Encounter (Signed)
Spoke with patient.

## 2022-09-01 NOTE — Telephone Encounter (Signed)
Patient returning call.

## 2022-10-24 ENCOUNTER — Other Ambulatory Visit: Payer: Self-pay

## 2022-10-24 ENCOUNTER — Encounter: Payer: Self-pay | Admitting: Emergency Medicine

## 2022-10-24 ENCOUNTER — Ambulatory Visit
Admission: EM | Admit: 2022-10-24 | Discharge: 2022-10-24 | Disposition: A | Discharge status: Home / Self Care | Payer: Self-pay | Attending: Family Medicine | Admitting: Family Medicine

## 2022-10-24 DIAGNOSIS — B354 Tinea corporis: Secondary | ICD-10-CM

## 2022-10-24 MED ORDER — KETOCONAZOLE 2 % EX CREA: 1.0000 | TOPICAL_CREAM | Freq: Two times a day (BID) | CUTANEOUS | 1 refills | Status: DC | PRN

## 2022-10-24 NOTE — ED Triage Notes (Signed)
Pt reports rash to BLE x2 weeks. Pt reports went to donate plasma today and reports was told had to come be evaluated and make sure rash wasn't "infectious".

## 2022-10-24 NOTE — ED Provider Notes (Signed)
[facility]    CSN: [id number] Arrival date & time: 10/24/22  1405      History   Chief Complaint Chief Complaint  Patient presents with   Rash    HPI Jackson Figueroa is a 61 y.o. male.   Presenting today with 2 weeks of a mildly itchy rash to his lower legs.  He denies any pain, drainage, bleeding, new products used, new outdoor exposures, new medications.  He has been trying Neosporin and moisturizers with no relief.  He states the main reason he is here today is he needs a form signed off saying that he is cleared to donate plasma and that the rash is not infectious.    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    Neuropathic pain    Critical illness myopathy    Physical deconditioning     Past Surgical History:  Procedure Laterality Date   APPENDECTOMY  1982   COLONOSCOPY WITH PROPOFOL N/A 06/18/2021    Procedure: COLONOSCOPY WITH PROPOFOL;  Surgeon: Harvel Quale, MD;  [facility];  Service: Gastroenterology;  Laterality: N/A;  9:15   HERNIA REPAIR N/A    Phreesia 03/03/2021   INGUINAL HERNIA REPAIR Left 2000   IR GASTROSTOMY TUBE MOD SED  11/21/2020   IR GASTROSTOMY TUBE REMOVAL  01/17/2021   KNEE ARTHROSCOPY     bilateral knees   POLYPECTOMY  06/18/2021   Procedure: POLYPECTOMY INTESTINAL;  Surgeon: Harvel Quale, MD;  [facility]: [facility];  Service: Gastroenterology;;   TONSILLECTOMY AND ADENOIDECTOMY     TRACHEOSTOMY TUBE PLACEMENT N/A 11/14/2020   Procedure: TRACHEOSTOMY;  Surgeon: Melida Quitter, MD;  Location: [facility];  Service: ENT;  Laterality: N/A;       Home Medications    Prior to Admission medications   Medication Sig Start Date End Date Taking? Authorizing Provider  ketoconazole (NIZORAL) 2 % cream Apply 1 Application topically 2 (two) times daily as needed for irritation. 10/24/22  Yes Volney American, PA-C  acetaminophen (TYLENOL) 325 MG tablet Take 1-2 tablets (325-650 mg total) by mouth every 4 (four) hours as needed for mild pain. 01/22/21   Love, Ivan Anchors, PA-C  Ascorbic Acid (VITAMIN C) 1000 MG tablet Take 1,000 mg by mouth daily.    [provider]  Capsaicin 0.025 % LOTN Apply 1 application topically daily. 07/18/21   Lovorn, Jinny Blossom, MD  Cholecalciferol (VITAMIN D3) 125 MCG (5000 UT) CAPS Take 5,000 Units by mouth daily.    [provider]  diclofenac Sodium (VOLTAREN) 1 % GEL Apply 2 g topically 4 (four) times daily. Patient taking differently: Apply 2 g topically 4 (four) times daily as needed (pain). 01/24/21   Love, Ivan Anchors, PA-C  diphenhydrAMINE (BENADRYL) 25 MG tablet Take 25 mg by mouth at bedtime.    [provider]  famotidine (PEPCID) 10 MG tablet Take 10 mg by mouth daily.    [provider]  fluticasone (FLONASE) 50 MCG/ACT nasal spray Place 2 sprays into both nostrils daily.  10/09/21   Lindell Spar, MD  ketoconazole (NIZORAL) 2 % cream Apply 1 Application topically daily. 08/07/22   Lindell Spar, MD  lisinopril (ZESTRIL) 5 MG tablet Take 1 tablet (5 mg total) by mouth daily. 08/07/22   Lindell Spar, MD  Misc Natural Products [facility] COMPLEX PO) Take 1,000 mg by mouth daily.    [provider]  Multiple Vitamin (MULTIVITAMIN WITH MINERALS) TABS tablet Take 1 tablet by mouth daily.    [provider]  phenylephrine (SUDAFED PE) 10 MG TABS tablet Take 10 mg by mouth every 4 (four) hours as needed (congestion).    [provider]  pyridoxine (B-6) 100 MG tablet Take 100 mg by mouth daily.    [provider]  traMADol (ULTRAM) 50 MG tablet Take 1 tablet (50 mg total) by mouth every 6 (six) hours as needed for severe pain. Not sure long term med at this time 04/25/21   Courtney Heys, MD  zinc gluconate 50 MG tablet Take 50 mg by mouth daily.    [provider]    Family History Family History  Problem Relation Age of Onset   Cerebral aneurysm Mother 84   Heart disease Father    Migraines Sister     Social History Social History   Tobacco Use   Smoking status: Former    Packs/day: 2.00    Types: Cigarettes    Quit date: 03/23/2012    Years since quitting: 10.5   Smokeless tobacco: Never  Vaping Use   Vaping Use: Never used  Substance Use Topics   Alcohol use: Yes    Alcohol/week: 2.0 standard drinks of alcohol    Types: 2 Glasses of wine per week    Comment: occasional wine   Drug use: Never     Allergies   Patient has no known allergies.   Review of Systems Review of Systems PER HPI  Physical Exam Triage Vital Signs ED Triage Vitals  Enc Vitals Group     BP 10/24/22 1417 (!) 146/93     Pulse Rate 10/24/22 1417 74     Resp 10/24/22 1417 20     Temp 10/24/22 1417 98 F (36.7 C)     Temp Source 10/24/22 1417 Oral     SpO2 10/24/22 1417 97 %     Weight --      Height --      Head  Circumference --      Peak Flow --      Pain Score 10/24/22 1416 0     Pain Loc --      Pain Edu? --      Excl. in [place]? --    No data found.  Updated Vital Signs BP Marland Kitchen)  146/93 (BP Location: Right Arm)   Pulse 74   Temp 98 F (36.7 C) (Oral)   Resp 20   SpO2 97%   Visual Acuity Right Eye Distance:   Left Eye Distance:   Bilateral Distance:    Right Eye Near:   Left Eye Near:    Bilateral Near:     Physical Exam Vitals and nursing note reviewed.  Constitutional:      Appearance: Normal appearance.  HENT:     Head: Atraumatic.  Eyes:     Extraocular Movements: Extraocular movements intact.     Conjunctiva/sclera: Conjunctivae normal.  Cardiovascular:     Rate and Rhythm: Normal rate and regular rhythm.  Pulmonary:     Effort: Pulmonary effort is normal.     Breath sounds: Normal breath sounds.  Musculoskeletal:        General: Normal range of motion.     Cervical back: Normal range of motion and neck supple.  Skin:    General: Skin is warm and dry.     Findings: Rash present.     Comments: Erythematous raised circular patches to bilateral lower legs and clusters, all with raised borders and dry peeling skin  Neurological:     General: No focal deficit present.     Mental Status: He is oriented to person, place, and time.  Psychiatric:        Mood and Affect: Mood normal.        Thought Content: Thought content normal.        Judgment: Judgment normal.      UC Treatments / Results  Labs (all labs ordered are listed, but only abnormal results are displayed) Labs Reviewed - No data to display  EKG   Radiology No results found.  Procedures Procedures (including critical care time)  Medications Ordered in UC Medications - No data to display  Initial Impression / Assessment and Plan / UC Course  I have reviewed the triage vital signs and the nursing notes.  Pertinent labs & imaging results that were available during my care of the patient were  reviewed by me and considered in my medical decision making (see chart for details).     Rash appears most consistent with a fungal infection.  Treat with ketoconazole cream and good moisturization.  He has a form with him for plasma donation that needs to be signed off, did sign him off as approved for donation but added that the rash needs to be cleared fully prior to next donation. Final Clinical Impressions(s) / UC Diagnoses   Final diagnoses:  Tinea corporis   Discharge Instructions   None    ED Prescriptions     Medication Sig Dispense Auth. Provider   ketoconazole (NIZORAL) 2 % cream Apply 1 Application topically 2 (two) times daily as needed for irritation. 80 g Volney American, [place]      PDMP not reviewed this encounter.   Volney American, [place] 10/24/22 1510

## 2022-11-11 ENCOUNTER — Encounter: Payer: Self-pay | Admitting: Internal Medicine

## 2022-11-11 ENCOUNTER — Ambulatory Visit (INDEPENDENT_AMBULATORY_CARE_PROVIDER_SITE_OTHER): Payer: Self-pay | Admitting: Internal Medicine

## 2022-11-11 ENCOUNTER — Other Ambulatory Visit (INDEPENDENT_AMBULATORY_CARE_PROVIDER_SITE_OTHER): Payer: Self-pay | Admitting: Gastroenterology

## 2022-11-11 VITALS — BP 118/66 | HR 87 | Ht 72.0 in | Wt 284.8 lb

## 2022-11-11 DIAGNOSIS — B354 Tinea corporis: Secondary | ICD-10-CM

## 2022-11-11 DIAGNOSIS — N1831 Chronic kidney disease, stage 3a: Secondary | ICD-10-CM

## 2022-11-11 DIAGNOSIS — I10 Essential (primary) hypertension: Secondary | ICD-10-CM

## 2022-11-11 DIAGNOSIS — Z0001 Encounter for general adult medical examination with abnormal findings: Secondary | ICD-10-CM | POA: Insufficient documentation

## 2022-11-11 MED ORDER — CLOTRIMAZOLE-BETAMETHASONE 1-0.05 % EX CREA: 1.0000 | TOPICAL_CREAM | Freq: Every day | CUTANEOUS | 0 refills | Status: DC

## 2022-11-11 MED ORDER — LISINOPRIL 5 MG PO TABS: 5.0000 mg | ORAL_TABLET | Freq: Every day | ORAL | 1 refills | Status: DC

## 2022-11-11 NOTE — Assessment & Plan Note (Signed)
Rise in S. Cr. since hospitalization for COVID Last CMP improved Avoid nephrotoxic agents On Lisinopril for HTN Reviewed UA - no gross proteinuria

## 2022-11-11 NOTE — Progress Notes (Addendum)
Established Patient Office Visit  Subjective:  Patient ID: Jackson Figueroa, male    DOB: [birth year]  Age: 61 y.o. MRN: [id number]  CC:  Chief Complaint  Patient presents with   Annual Exam    Patient said he has a rash below his knees he would like to be looked at     HPI Jackson Figueroa is a 61 y.o. male with past medical history of HTN and CKD stage 3a who presents for f/u of his chronic medical conditions.  He reports rash on bilateral legs for the last 4 weeks.  Denies any itching.  He has tried applying Aquaphor cream with some relief.  HTN: BP is well-controlled. Takes medications regularly. Patient denies headache, dizziness, chest pain, dyspnea or palpitations.   CKD stage 3a: His serum creatinine was 1.39 and GFR was 58, improved from prior. His serum creatinine has been elevated since prolonged hospitalization for COVID.  He needs to improve fluid intake as well.  Does not report taking any NSAIDs currently.  He is on lisinopril for HTN now.  He denies any dysuria, hematuria or urinary hesitancy or resistance.  Denies any pelvic or flank pain currently.  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    Tendinitis of elbow    right   Urinary incontinence 03/07/2021    Past Surgical History:  Procedure Laterality Date   APPENDECTOMY  1982   COLONOSCOPY WITH PROPOFOL N/A 06/18/2021   Procedure: COLONOSCOPY WITH PROPOFOL;  Surgeon: Harvel Quale, MD;  [facility];  Service: Gastroenterology;  Laterality: N/A;  9:15   HERNIA REPAIR N/A     Phreesia 03/03/2021   INGUINAL HERNIA REPAIR Left 2000   IR GASTROSTOMY TUBE MOD SED  11/21/2020   IR GASTROSTOMY TUBE REMOVAL  01/17/2021   KNEE ARTHROSCOPY     bilateral knees   POLYPECTOMY  06/18/2021   Procedure: POLYPECTOMY INTESTINAL;  Surgeon: Harvel Quale, MD;  [facility];  Service: Gastroenterology;;   TONSILLECTOMY AND ADENOIDECTOMY     TRACHEOSTOMY TUBE PLACEMENT N/A 11/14/2020   Procedure: TRACHEOSTOMY;  Surgeon: Melida Quitter, MD;  Location[facility];  Service: ENT;  Laterality: N/A;    Family History  Problem Relation Age of Onset   Cerebral aneurysm Mother 94   Heart disease Father    Migraines Sister     Social History   Socioeconomic History   Marital status: Significant Other    Spouse name: Not on file   Number of children: Not on file   Years of education: Not on file   Highest education level: Not on file  Occupational History   Not on file  Tobacco Use   Smoking status: Former    Packs/day: 2.00    Types: Cigarettes    Quit date: 03/23/2012    Years since quitting: 10.6   Smokeless tobacco: Never  Vaping Use   Vaping Use: Never used  Substance and Sexual Activity   Alcohol use:  Yes    Alcohol/week: 2.0 standard drinks of alcohol    Types: 2 Glasses of wine per week    Comment: occasional wine   Drug use: Never   Sexual activity: Not on file  Other Topics Concern   Not on file  Social History Narrative   Not on file   Social Determinants of Health   Financial Resource Strain: Not on file  Food Insecurity: Not on file  Transportation Needs: Not on file  Physical Activity: Not on file  Stress: Not on file  Social Connections: Not on file  Intimate Partner Violence: Not on file    Outpatient Medications Prior to Visit  Medication Sig Dispense Refill   acetaminophen (TYLENOL) 325 MG tablet Take 1-2 tablets (325-650 mg total) by mouth every 4 (four) hours as needed for mild pain.     Ascorbic Acid (VITAMIN C) 1000  MG tablet Take 1,000 mg by mouth daily.     Capsaicin 0.025 % LOTN Apply 1 application topically daily. 113.2 mL 1   Cholecalciferol (VITAMIN D3) 125 MCG (5000 UT) CAPS Take 5,000 Units by mouth daily.     diclofenac Sodium (VOLTAREN) 1 % GEL Apply 2 g topically 4 (four) times daily. (Patient taking differently: Apply 2 g topically 4 (four) times daily as needed (pain).) 350 g 0   diphenhydrAMINE (BENADRYL) 25 MG tablet Take 25 mg by mouth at bedtime.     famotidine (PEPCID) 10 MG tablet Take 10 mg by mouth daily.     fluticasone (FLONASE) 50 MCG/ACT nasal spray Place 2 sprays into both nostrils daily. 16 g 6   Misc Natural Products (GINSENG COMPLEX PO) Take 1,000 mg by mouth daily.     Multiple Vitamin (MULTIVITAMIN WITH MINERALS) TABS tablet Take 1 tablet by mouth daily.     phenylephrine (SUDAFED PE) 10 MG TABS tablet Take 10 mg by mouth every 4 (four) hours as needed (congestion).     pyridoxine (B-6) 100 MG tablet Take 100 mg by mouth daily.     zinc gluconate 50 MG tablet Take 50 mg by mouth daily.     ketoconazole (NIZORAL) 2 % cream Apply 1 Application topically daily. 15 g 0   ketoconazole (NIZORAL) 2 % cream Apply 1 Application topically 2 (two) times daily as needed for irritation. 80 g 1   lisinopril (ZESTRIL) 5 MG tablet Take 1 tablet (5 mg total) by mouth daily. 90 tablet 1   traMADol (ULTRAM) 50 MG tablet Take 1 tablet (50 mg total) by mouth every 6 (six) hours as needed for severe pain. Not sure long term med at this time 120 tablet 1   No facility-administered medications prior to visit.    No Known Allergies  ROS Review of Systems  Constitutional:  Negative for chills and fever.  HENT:  Negative for congestion and sore throat.   Eyes:  Negative for pain and discharge.  Respiratory:  Negative for cough and shortness of breath.   Cardiovascular:  Negative for chest pain and palpitations.  Gastrointestinal:  Negative for diarrhea, nausea and vomiting.  Endocrine: Negative  for polydipsia and polyuria.  Genitourinary:  Negative for dysuria and hematuria.  Musculoskeletal:  Positive for arthralgias, back pain and neck pain. Negative for neck stiffness.  Skin:  Positive for rash.  Neurological:  Negative for dizziness, weakness, numbness and headaches.  Psychiatric/Behavioral:  Negative for agitation and behavioral problems.       Objective:    Physical Exam Vitals reviewed.  Constitutional:  General: He is not in acute distress.    Appearance: He is not diaphoretic.  HENT:     Head: Normocephalic and atraumatic.     Nose: Nose normal.     Mouth/Throat:     Mouth: Mucous membranes are moist.  Eyes:     General: No scleral icterus.    Extraocular Movements: Extraocular movements intact.  Cardiovascular:     Rate and Rhythm: Normal rate and regular rhythm.     Pulses: Normal pulses.     Heart sounds: Normal heart sounds. No murmur heard. Pulmonary:     Breath sounds: Normal breath sounds. No wheezing or rales.  Musculoskeletal:     Cervical back: Neck supple. No tenderness.     Right lower leg: No edema.     Left lower leg: No edema.  Skin:    General: Skin is warm.     Findings: Rash (Erythematous patches over b/l legs) present.  Neurological:     General: No focal deficit present.     Mental Status: He is alert and oriented to person, place, and time.     Sensory: No sensory deficit.     Motor: No weakness.  Psychiatric:        Mood and Affect: Mood normal.        Behavior: Behavior normal.     BP 118/66 (BP Location: Left Arm, Patient Position: Sitting, Cuff Size: Large)   Pulse 87   Ht 6' (1.829 m)   Wt 284 lb 12.8 oz (129.2 kg)   SpO2 93%   BMI 38.63 kg/m  Wt Readings from Last 3 Encounters:  11/11/22 284 lb 12.8 oz (129.2 kg)  08/07/22 295 lb 9.6 oz (134.1 kg)  02/09/22 299 lb 3.2 oz (135.7 kg)    Lab Results  Component Value Date   TSH 1.910 08/05/2022   Lab Results  Component Value Date   WBC 3.4 08/05/2022    HGB 14.9 08/05/2022   HCT 43.1 08/05/2022   MCV 98 (H) 08/05/2022   PLT 197 08/05/2022   Lab Results  Component Value Date   NA 140 08/05/2022   K 4.7 08/05/2022   CO2 24 08/05/2022   GLUCOSE 85 08/05/2022   BUN 18 08/05/2022   CREATININE 1.39 (H) 08/05/2022   BILITOT 0.5 08/05/2022   ALKPHOS 72 08/05/2022   AST 25 08/05/2022   ALT 30 08/05/2022   PROT 6.8 08/05/2022   ALBUMIN 4.4 08/05/2022   CALCIUM 9.7 08/05/2022   ANIONGAP 10 01/29/2021   EGFR 58 (L) 08/05/2022   Lab Results  Component Value Date   CHOL 187 08/05/2022   Lab Results  Component Value Date   HDL 45 08/05/2022   Lab Results  Component Value Date   LDLCALC 121 (H) 08/05/2022   Lab Results  Component Value Date   TRIG 118 08/05/2022   Lab Results  Component Value Date   CHOLHDL 4.2 08/05/2022   Lab Results  Component Value Date   HGBA1C 4.9 08/05/2022      Assessment & Plan:   Problem List Items Addressed This Visit       Cardiovascular and Mediastinum   Essential hypertension - Primary    BP Readings from Last 1 Encounters:  11/11/22 118/66  Well-controlled with Lisinopril 5 mg QD Counseled for compliance with the medications Advised DASH diet and moderate exercise/walking, at least 150 mins/week      Relevant Medications   lisinopril (ZESTRIL) 5 MG tablet  Musculoskeletal and Integument   Tinea corporis    Recently went to urgent care, was given ketoconazole cream, which has not improved his rash Due to no insurance coverage, can't afford oral itraconazole He has tried Aquaphor with relief Switched to clotrimazole-betamethasone cream      Relevant Medications   clotrimazole-betamethasone (LOTRISONE) cream     Genitourinary   Stage 3a chronic kidney disease ([place])    Rise in S. Cr. since hospitalization for COVID Last CMP improved Avoid nephrotoxic agents On Lisinopril for HTN Reviewed UA - no gross proteinuria      Relevant Orders   Basic Metabolic Panel (BMET)     Meds ordered this encounter  Medications   clotrimazole-betamethasone (LOTRISONE) cream    Sig: Apply 1 Application topically daily.    Dispense:  45 g    Refill:  0   lisinopril (ZESTRIL) 5 MG tablet    Sig: Take 1 tablet (5 mg total) by mouth daily.    Dispense:  90 tablet    Refill:  1    Follow-up: Return in about 6 months (around 05/13/2023) for HTN and CKD.    Lindell Spar, MD

## 2022-11-11 NOTE — Patient Instructions (Signed)
Please apply Clotrimazole-Betamethasone over leg rash.  Please continue taking other medications as prescribed.  Please continue to follow low salt diet and perform moderate exercise/walking at least 150 mins/week.  You are medically optimized for plasma donation.

## 2022-11-11 NOTE — Assessment & Plan Note (Addendum)
Recently went to urgent care, was given ketoconazole cream, which has not improved his rash Due to no insurance coverage, can't afford oral itraconazole He has tried Aquaphor with relief Switched to clotrimazole-betamethasone cream

## 2022-11-11 NOTE — Assessment & Plan Note (Signed)
BP Readings from Last 1 Encounters:  11/11/22 118/66   Well-controlled with Lisinopril 5 mg QD Counseled for compliance with the medications Advised DASH diet and moderate exercise/walking, at least 150 mins/week

## 2023-01-21 ENCOUNTER — Encounter: Payer: Self-pay | Admitting: Radiology

## 2023-04-01 ENCOUNTER — Ambulatory Visit
Admission: EM | Admit: 2023-04-01 | Discharge: 2023-04-01 | Disposition: A | Discharge status: Home / Self Care | Payer: BC Managed Care – PPO | Attending: Nurse Practitioner | Admitting: Nurse Practitioner

## 2023-04-01 DIAGNOSIS — R252 Cramp and spasm: Secondary | ICD-10-CM

## 2023-04-01 DIAGNOSIS — S46812A Strain of other muscles, fascia and tendons at shoulder and upper arm level, left arm, initial encounter: Secondary | ICD-10-CM

## 2023-04-01 MED ORDER — TIZANIDINE HCL 4 MG PO TABS: 4.0000 mg | ORAL_TABLET | Freq: Three times a day (TID) | ORAL | 0 refills | Status: DC | PRN

## 2023-04-01 NOTE — Discharge Instructions (Addendum)
We will call you if the blood work from today looks abnormal.  In the meantime, please start the neck exercises and take the tizanidine at nighttime as needed for muscular pain.  Make sure you are drinking plenty of water.

## 2023-04-01 NOTE — ED Provider Notes (Signed)
[facility]    CSN: [id number] Arrival date & time: 04/01/23  4098      History   Chief Complaint No chief complaint on file.   HPI Jackson Figueroa is a 62 y.o. male.   Patient presents today with 10-day history of neck pain.  Reports he has been traveling a lot recently for work and has been sleeping in bedside and that his.  Reports the neck pain is worse on the left side and it is at the base of his skull.  Reports movement makes the pain worse.  Currently, the pain is a level 8 out of 10 it is a sharp pain.  The pain does not radiate down the arm.  Patient denies weakness of upper extremities, numbness or tingling shooting down the arm or decreased sensation of bilateral upper extremities.  No fevers or nausea/vomiting since the pain began.  Reports prior to the pain starting, he also recently started doing home gym workouts.  Patient has taken Tylenol as well as tried rest and heat without much improvement.  Patient is also concerned about muscle cramps that he has been having in his calves, wonders if this may be related to the neck pain.  Reports when he has the muscle cramps, he took a bunch of water and stretches and that seems to help.  He has a history of chronic kidney disease and is a little concerned about his kidneys.     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   Allergic sinusitis 10/10/2021   Essential hypertension 07/15/2021   Stage 3a chronic kidney disease (HCC) 07/15/2021   Mixed hyperlipidemia 07/15/2021   Prostate cancer screening 07/15/2021   Impaired gait 03/05/2021   Recurrent pain of both knees 03/05/2021   Insomnia 01/24/2021   Acute meniscal tear of left knee 01/24/2021   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    Neuropathic pain    Critical illness myopathy    Physical deconditioning     Past Surgical History:  Procedure Laterality Date   APPENDECTOMY  1982   COLONOSCOPY WITH PROPOFOL N/A 06/18/2021   Procedure: COLONOSCOPY WITH PROPOFOL;  Surgeon: Dolores Frame, MD;  [facility];  Service: Gastroenterology;  Laterality: N/A;  9:15   HERNIA REPAIR N/A    Phreesia 03/03/2021   INGUINAL HERNIA REPAIR Left 2000   IR GASTROSTOMY TUBE MOD SED  11/21/2020   IR GASTROSTOMY TUBE REMOVAL  01/17/2021   KNEE ARTHROSCOPY     bilateral knees   POLYPECTOMY  06/18/2021   Procedure: POLYPECTOMY INTESTINAL;  Surgeon: Marguerita Merles, Reuel Boom, MD;  [facility]: [facility];  Service: Gastroenterology;;   TONSILLECTOMY AND ADENOIDECTOMY     TRACHEOSTOMY TUBE PLACEMENT N/A 11/14/2020   Procedure: TRACHEOSTOMY;  Surgeon: Christia Reading, MD;  Location: [facility];  Service: ENT;  Laterality: N/A;       Home Medications    Prior to Admission medications   Medication Sig Start Date End Date Taking? Authorizing Provider  tiZANidine (ZANAFLEX) 4 MG tablet Take 1 tablet (4 mg total) by mouth every 8 (eight) hours as needed for muscle spasms. Do not take with alcohol or while driving or operating heavy machinery.  May cause drowsiness. 04/01/23  Yes Valentino Nose, NP  acetaminophen (TYLENOL) 325 MG tablet Take 1-2 tablets  (325-650 mg total) by mouth every 4 (four) hours as needed for mild pain. 01/22/21   Love, Evlyn Kanner, PA-C  Ascorbic Acid (VITAMIN C) 1000 MG tablet Take 1,000 mg by mouth daily.    [provider]  Capsaicin 0.025 % LOTN Apply 1 application topically daily. 07/18/21   Lovorn, Aundra Millet, MD  Cholecalciferol (VITAMIN D3) 125 MCG (5000 UT) CAPS Take 5,000 Units by mouth daily.    [provider]  clotrimazole-betamethasone (LOTRISONE) cream Apply 1 Application topically daily. 11/11/22   Anabel Halon, MD  diclofenac Sodium (VOLTAREN) 1 % GEL Apply 2 g topically 4 (four) times daily. Patient taking differently: Apply 2 g topically 4 (four) times daily as needed (pain). 01/24/21   Love, Evlyn Kanner, PA-C  diphenhydrAMINE (BENADRYL) 25 MG tablet Take 25 mg by mouth at bedtime.    [provider]  famotidine (PEPCID) 10 MG tablet Take 10 mg by mouth daily.    [provider]  fluticasone (FLONASE) 50 MCG/ACT nasal spray Place 2 sprays into both nostrils daily. 10/09/21   Anabel Halon, MD  lisinopril (ZESTRIL) 5 MG tablet Take 1 tablet (5 mg total) by mouth daily. 11/11/22   Anabel Halon, MD  Misc Natural Products [facility] COMPLEX PO) Take 1,000 mg by mouth daily.    [provider]  Multiple Vitamin (MULTIVITAMIN WITH MINERALS) TABS tablet Take 1 tablet by mouth daily.    [provider]  phenylephrine (SUDAFED PE) 10 MG TABS tablet Take 10 mg by mouth every 4 (four) hours as needed (congestion).    [provider]  pyridoxine (B-6) 100 MG tablet Take 100 mg by mouth daily.    [provider]  zinc gluconate 50 MG tablet Take 50 mg by mouth daily.    [provider]    Family History Family History  Problem Relation Age of Onset   Cerebral aneurysm Mother 56   Heart disease Father    Migraines Sister     Social History Social History   Tobacco Use   Smoking status: Former    Packs/day: 2    Types: Cigarettes     Quit date: 03/23/2012    Years since quitting: 11.0   Smokeless tobacco: Never  Vaping Use   Vaping Use: Never used  Substance Use Topics   Alcohol use: Yes    Alcohol/week: 2.0 standard drinks of alcohol    Types: 2 Glasses of wine per week    Comment: occasional wine   Drug use: Never     Allergies   Patient has no known allergies.   Review of Systems Review of Systems Per HPI  Physical Exam Triage Vital Signs ED Triage Vitals  Enc Vitals Group  BP 04/01/23 0845 (!) 162/102     Pulse Rate 04/01/23 0845 67     Resp 04/01/23 0845 18     Temp 04/01/23 0845 98.3 F (36.8 C)     Temp Source 04/01/23 0845 Oral     SpO2 04/01/23 0845 93 %     Weight --      Height --      Head Circumference --      Peak Flow --      Pain Score 04/01/23 0846 8     Pain Loc --      Pain Edu? --      Excl. in GC? --    No data found.  Updated Vital Signs BP (!) 147/98 Comment: repeat obtained by NP.  Pulse 67   Temp 98.3 F (36.8 C) (Oral)   Resp 18   SpO2 93%   Blood pressure recheck: 147/98  Visual Acuity Right Eye Distance:   Left Eye Distance:   Bilateral Distance:    Right Eye Near:   Left Eye Near:    Bilateral Near:     Physical Exam Vitals and nursing note reviewed.  Constitutional:      General: He is not in acute distress.    Appearance: Normal appearance. He is not toxic-appearing.  Neck:      Comments: Inspection: No swelling, obvious deformity, redness, or bruising to left neck or upper back Palpation: Left neck tender to palpation in area marked; no obvious deformities palpated ROM: Full ROM to neck, bilateral upper extremities Strength: 5/5 neck, bilateral upper extremities Neurovascular: neurovascularly intact in left and right upper extremity  Pulmonary:     Effort: Pulmonary effort is normal. No respiratory distress.  Musculoskeletal:     Cervical back: Normal range of motion and neck supple. Tenderness present. No signs of trauma, rigidity or  torticollis. Pain with movement and muscular tenderness present. No spinous process tenderness. Normal range of motion.  Skin:    General: Skin is warm and dry.     Capillary Refill: Capillary refill takes less than 2 seconds.     Coloration: Skin is not jaundiced or pale.     Findings: No erythema.  Neurological:     General: No focal deficit present.     Mental Status: He is alert and oriented to person, place, and time.  Psychiatric:        Behavior: Behavior is cooperative.      UC Treatments / Results  Labs (all labs ordered are listed, but only abnormal results are displayed) Labs Reviewed  BASIC METABOLIC PANEL    EKG   Radiology No results found.  Procedures Procedures (including critical care time)  Medications Ordered in UC Medications - No data to display  Initial Impression / Assessment and Plan / UC Course  I have reviewed the triage vital signs and the nursing notes.  Pertinent labs & imaging results that were available during my care of the patient were reviewed by me and considered in my medical decision making (see chart for details).   Patient is well-appearing, normotensive, afebrile, not tachycardic, not tachypneic, oxygenating well on room air.    1. Strain of left trapezius muscle, initial encounter Suspect muscle strain Start tizanidine as needed Start back exercises and stretches Seek care for persistent or worsening symptoms despite treatment  2. Muscle cramps Given the history of chronic kidney disease, will check treatment to also evaluate electrolytes Recommended increasing water intake  The patient was given  the opportunity to ask questions.  All questions answered to their satisfaction.  The patient is in agreement to this plan.    Final Clinical Impressions(s) / UC Diagnoses   Final diagnoses:  Strain of left trapezius muscle, initial encounter  Muscle cramps     Discharge Instructions      We will call you if the blood  work from today looks abnormal.  In the meantime, please start the neck exercises and take the tizanidine at nighttime as needed for muscular pain.  Make sure you are drinking plenty of water.    ED Prescriptions     Medication Sig Dispense Auth. Provider   tiZANidine (ZANAFLEX) 4 MG tablet Take 1 tablet (4 mg total) by mouth every 8 (eight) hours as needed for muscle spasms. Do not take with alcohol or while driving or operating heavy machinery.  May cause drowsiness. 30 tablet Valentino Nose, NP      PDMP not reviewed this encounter.   Valentino Nose, NP 04/01/23 [phone]

## 2023-04-01 NOTE — ED Triage Notes (Signed)
Pt has left side neck pain x 10 days. Took tylenol and ibuprofen but no relief.

## 2023-04-02 LAB — BASIC METABOLIC PANEL
BUN/Creatinine Ratio: 16 (ref 10–24)
BUN: 20 mg/dL (ref 8–27)
CO2: 18 mmol/L — ABNORMAL LOW (ref 20–29)
Calcium: 9 mg/dL (ref 8.6–10.2)
Chloride: 105 mmol/L (ref 96–106)
Creatinine, Ser: 1.22 mg/dL (ref 0.76–1.27)
Glucose: 94 mg/dL (ref 70–99)
Potassium: 4.7 mmol/L (ref 3.5–5.2)
Sodium: 140 mmol/L (ref 134–144)
eGFR: 68 mL/min/{1.73_m2} (ref >59)

## 2023-04-29 DIAGNOSIS — N1831 Chronic kidney disease, stage 3a: Secondary | ICD-10-CM | POA: Diagnosis not present

## 2023-04-30 LAB — BASIC METABOLIC PANEL
BUN/Creatinine Ratio: 15 (ref 10–24)
BUN: 21 mg/dL (ref 8–27)
CO2: 24 mmol/L (ref 20–29)
Calcium: 9.7 mg/dL (ref 8.6–10.2)
Chloride: 104 mmol/L (ref 96–106)
Creatinine, Ser: 1.44 mg/dL — ABNORMAL HIGH (ref 0.76–1.27)
Glucose: 89 mg/dL (ref 70–99)
Potassium: 5.1 mmol/L (ref 3.5–5.2)
Sodium: 142 mmol/L (ref 134–144)
eGFR: 56 mL/min/{1.73_m2} — ABNORMAL LOW (ref >59)

## 2023-05-13 ENCOUNTER — Encounter: Payer: Self-pay | Admitting: Internal Medicine

## 2023-05-13 ENCOUNTER — Ambulatory Visit (INDEPENDENT_AMBULATORY_CARE_PROVIDER_SITE_OTHER): Payer: BC Managed Care – PPO | Admitting: Internal Medicine

## 2023-05-13 VITALS — BP 117/83 | HR 70 | Ht 72.0 in | Wt 279.8 lb

## 2023-05-13 DIAGNOSIS — N1831 Chronic kidney disease, stage 3a: Secondary | ICD-10-CM | POA: Diagnosis not present

## 2023-05-13 DIAGNOSIS — Z122 Encounter for screening for malignant neoplasm of respiratory organs: Secondary | ICD-10-CM

## 2023-05-13 DIAGNOSIS — I10 Essential (primary) hypertension: Secondary | ICD-10-CM | POA: Diagnosis not present

## 2023-05-13 DIAGNOSIS — Z23 Encounter for immunization: Secondary | ICD-10-CM | POA: Diagnosis not present

## 2023-05-13 DIAGNOSIS — E559 Vitamin D deficiency, unspecified: Secondary | ICD-10-CM

## 2023-05-13 DIAGNOSIS — Z125 Encounter for screening for malignant neoplasm of prostate: Secondary | ICD-10-CM

## 2023-05-13 DIAGNOSIS — R7303 Prediabetes: Secondary | ICD-10-CM

## 2023-05-13 DIAGNOSIS — E782 Mixed hyperlipidemia: Secondary | ICD-10-CM | POA: Diagnosis not present

## 2023-05-13 NOTE — Assessment & Plan Note (Signed)
Lipid profile reviewed Advised to follow DASH diet 

## 2023-05-13 NOTE — Progress Notes (Signed)
Established Patient Office Visit  Subjective:  Patient ID: Jackson Figueroa, male    DOB: Feb 10, 1961  Age: 62 y.o. MRN: [id number]  CC:  Chief Complaint  Patient presents with   Hypertension   Chronic Kidney Disease    HPI Jackson Figueroa is a 62 y.o. male with past medical history of HTN and CKD stage 3a who presents for f/u of his chronic medical conditions.  HTN: BP is well-controlled. Takes medications regularly. Patient denies headache, dizziness, chest pain, dyspnea or palpitations.   CKD stage 3a: His serum creatinine was 1.44 and GFR was 56, stable. His serum creatinine has been elevated since prolonged hospitalization for COVID.  He needs to improve fluid intake as well.  Does not report taking any NSAIDs currently.  He is on lisinopril for HTN now.  He denies any dysuria, hematuria or urinary hesitancy or resistance.  Denies any pelvic or flank pain currently.  He has started working again in [place].  He received first dose of Shingrix vaccine today.  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    Tendinitis of elbow    right   Urinary incontinence 03/07/2021    Past Surgical History:  Procedure Laterality Date   APPENDECTOMY  1982   COLONOSCOPY WITH PROPOFOL N/A 06/18/2021   Procedure: COLONOSCOPY WITH PROPOFOL;  Surgeon: Dolores Frame, MD;  [facility];  Service: Gastroenterology;  Laterality: N/A;  9:15   HERNIA REPAIR N/A    Phreesia 03/03/2021   INGUINAL HERNIA REPAIR Left 2000   IR GASTROSTOMY TUBE MOD SED  11/21/2020    IR GASTROSTOMY TUBE REMOVAL  01/17/2021   KNEE ARTHROSCOPY     bilateral knees   POLYPECTOMY  06/18/2021   Procedure: POLYPECTOMY INTESTINAL;  Surgeon: Dolores Frame, MD;  [facility];  Service: Gastroenterology;;   TONSILLECTOMY AND ADENOIDECTOMY     TRACHEOSTOMY TUBE PLACEMENT N/A 11/14/2020   Procedure: TRACHEOSTOMY;  Surgeon: Christia Reading, MD;  Location: [facility];  Service: ENT;  Laterality: N/A;    Family History  Problem Relation Age of Onset   Cerebral aneurysm Mother 66   Heart disease Father    Migraines Sister     Social History   Socioeconomic History   Marital status: Significant Other    Spouse name: Not on file   Number of children: Not on file   Years of education: Not on file   Highest education level: Bachelor's degree (e.g., BA, AB, BS)  Occupational History   Not on file  Tobacco Use   Smoking status: Former    Packs/day: 1.00    Years: 30.00    Additional pack years: 0.00    Total pack years: 30.00    Types: Cigarettes    Quit date: 03/23/2012    Years since quitting: 11.1   Smokeless tobacco: Never  Vaping Use   Vaping Use: Never used  Substance and Sexual Activity   Alcohol use: Yes    Alcohol/week:  2.0 standard drinks of alcohol    Types: 2 Glasses of wine per week    Comment: occasional wine   Drug use: Never   Sexual activity: Not on file  Other Topics Concern   Not on file  Social History Narrative   Not on file   Social Determinants of Health   Financial Resource Strain: Low Risk  (05/12/2023)   Overall Financial Resource Strain (CARDIA)    Difficulty of Paying Living Expenses: Not very hard  Food Insecurity: No Food Insecurity (05/12/2023)   Hunger Vital Sign    Worried About Running Out of Food in the Last Year: Never true    Ran Out of Food in the Last Year: Never true  Transportation Needs: No Transportation Needs (05/12/2023)   PRAPARE - Administrator, Civil Service (Medical): No    Lack of  Transportation (Non-Medical): No  Physical Activity: Insufficiently Active (05/12/2023)   Exercise Vital Sign    Days of Exercise per Week: 4 days    Minutes of Exercise per Session: 20 min  Stress: No Stress Concern Present (05/12/2023)   [employer] of Occupational Health - Occupational Stress Questionnaire    Feeling of Stress : Only a little  Social Connections: Socially Isolated (05/12/2023)   Social Connection and Isolation Panel [NHANES]    Frequency of Communication with Friends and Family: Never    Frequency of Social Gatherings with Friends and Family: Never    Attends Religious Services: Never    Database administrator or Organizations: No    Attends Engineer, structural: Not on file    Marital Status: Divorced  Catering manager Violence: Not on file    Outpatient Medications Prior to Visit  Medication Sig Dispense Refill   acetaminophen (TYLENOL) 325 MG tablet Take 1-2 tablets (325-650 mg total) by mouth every 4 (four) hours as needed for mild pain.     Capsaicin 0.025 % LOTN Apply 1 application topically daily. 113.2 mL 1   Cholecalciferol (VITAMIN D3) 125 MCG (5000 UT) CAPS Take 5,000 Units by mouth daily.     diclofenac Sodium (VOLTAREN) 1 % GEL Apply 2 g topically 4 (four) times daily. (Patient taking differently: Apply 2 g topically 4 (four) times daily as needed (pain).) 350 g 0   diphenhydrAMINE (BENADRYL) 25 MG tablet Take 25 mg by mouth at bedtime.     famotidine (PEPCID) 10 MG tablet Take 10 mg by mouth daily.     fluticasone (FLONASE) 50 MCG/ACT nasal spray Place 2 sprays into both nostrils daily. 16 g 6   lisinopril (ZESTRIL) 5 MG tablet Take 1 tablet (5 mg total) by mouth daily. 90 tablet 1   Misc Natural Products (GINSENG COMPLEX PO) Take 1,000 mg by mouth daily.     Multiple Vitamin (MULTIVITAMIN WITH MINERALS) TABS tablet Take 1 tablet by mouth daily.     phenylephrine (SUDAFED PE) 10 MG TABS tablet Take 10 mg by mouth every 4 (four) hours as  needed (congestion).     pyridoxine (B-6) 100 MG tablet Take 100 mg by mouth daily.     tiZANidine (ZANAFLEX) 4 MG tablet Take 1 tablet (4 mg total) by mouth every 8 (eight) hours as needed for muscle spasms. Do not take with alcohol or while driving or operating heavy machinery.  May cause drowsiness. 30 tablet 0   zinc gluconate 50 MG tablet Take 50 mg by mouth daily.     Ascorbic Acid (VITAMIN C) 1000 MG  tablet Take 1,000 mg by mouth daily.     clotrimazole-betamethasone (LOTRISONE) cream Apply 1 Application topically daily. 45 g 0   No facility-administered medications prior to visit.    No Known Allergies  ROS Review of Systems  Constitutional:  Negative for chills and fever.  HENT:  Negative for congestion and sore throat.   Eyes:  Negative for pain and discharge.  Respiratory:  Negative for cough and shortness of breath.   Cardiovascular:  Negative for chest pain and palpitations.  Gastrointestinal:  Negative for diarrhea, nausea and vomiting.  Endocrine: Negative for polydipsia and polyuria.  Genitourinary:  Negative for dysuria and hematuria.  Musculoskeletal:  Positive for arthralgias, back pain and neck pain. Negative for neck stiffness.  Skin:  Negative for rash.  Neurological:  Negative for dizziness, weakness, numbness and headaches.  Psychiatric/Behavioral:  Negative for agitation and behavioral problems.       Objective:    Physical Exam Vitals reviewed.  Constitutional:      General: He is not in acute distress.    Appearance: He is not diaphoretic.  HENT:     Head: Normocephalic and atraumatic.     Nose: Nose normal.     Mouth/Throat:     Mouth: Mucous membranes are moist.  Eyes:     General: No scleral icterus.    Extraocular Movements: Extraocular movements intact.  Cardiovascular:     Rate and Rhythm: Normal rate and regular rhythm.     Pulses: Normal pulses.     Heart sounds: Normal heart sounds. No murmur heard. Pulmonary:     Breath sounds:  Normal breath sounds. No wheezing or rales.  Musculoskeletal:     Cervical back: Neck supple. No tenderness.     Right lower leg: No edema.     Left lower leg: No edema.  Skin:    General: Skin is warm.     Findings: No rash.  Neurological:     General: No focal deficit present.     Mental Status: He is alert and oriented to person, place, and time.     Sensory: No sensory deficit.     Motor: No weakness.  Psychiatric:        Mood and Affect: Mood normal.        Behavior: Behavior normal.     BP 117/83 (BP Location: Right Arm, Patient Position: Sitting, Cuff Size: Large)   Pulse 70   Ht 6' (1.829 m)   Wt 279 lb 12.8 oz (126.9 kg)   SpO2 94%   BMI 37.95 kg/m  Wt Readings from Last 3 Encounters:  05/13/23 279 lb 12.8 oz (126.9 kg)  11/11/22 284 lb 12.8 oz (129.2 kg)  08/07/22 295 lb 9.6 oz (134.1 kg)    Lab Results  Component Value Date   TSH 1.910 08/05/2022   Lab Results  Component Value Date   WBC 3.4 08/05/2022   HGB 14.9 08/05/2022   HCT 43.1 08/05/2022   MCV 98 (H) 08/05/2022   PLT 197 08/05/2022   Lab Results  Component Value Date   NA 142 04/29/2023   K 5.1 04/29/2023   CO2 24 04/29/2023   GLUCOSE 89 04/29/2023   BUN 21 04/29/2023   CREATININE 1.44 (H) 04/29/2023   BILITOT 0.5 08/05/2022   ALKPHOS 72 08/05/2022   AST 25 08/05/2022   ALT 30 08/05/2022   PROT 6.8 08/05/2022   ALBUMIN 4.4 08/05/2022   CALCIUM 9.7 04/29/2023   ANIONGAP 10 01/29/2021  EGFR 56 (L) 04/29/2023   Lab Results  Component Value Date   CHOL 187 08/05/2022   Lab Results  Component Value Date   HDL 45 08/05/2022   Lab Results  Component Value Date   LDLCALC 121 (H) 08/05/2022   Lab Results  Component Value Date   TRIG 118 08/05/2022   Lab Results  Component Value Date   CHOLHDL 4.2 08/05/2022   Lab Results  Component Value Date   HGBA1C 4.9 08/05/2022      Assessment & Plan:   Problem List Items Addressed This Visit       Cardiovascular and  Mediastinum   Essential hypertension - Primary    BP Readings from Last 1 Encounters:  05/13/23 117/83  Well-controlled with Lisinopril 5 mg QD Counseled for compliance with the medications Advised DASH diet and moderate exercise/walking, at least 150 mins/week      Relevant Orders   TSH   CBC with Differential/Platelet     Genitourinary   Stage 3a chronic kidney disease (HCC)    Rise in S. Cr. since hospitalization for COVID Last BMP stable, GFR 56 Avoid nephrotoxic agents On Lisinopril for HTN Reviewed UA - no gross proteinuria      Relevant Orders   CMP14+EGFR     Other   Mixed hyperlipidemia    Lipid profile reviewed Advised to follow DASH diet      Relevant Orders   Lipid panel   Prostate cancer screening    Ordered PSA after discussing limitations of PSA screening including false positive result leading to additional testing.      Relevant Orders   PSA   Screening for lung cancer    Quit smoking in 2013, has > 20-pack-year smoking history Ordered low-dose CT chest after discussing with the patient.       Relevant Orders   CT CHEST LUNG CANCER SCREENING LOW DOSE WO CONTRAST   Other Visit Diagnoses     Vitamin D deficiency       Relevant Orders   VITAMIN D 25 Hydroxy (Vit-D Deficiency, Fractures)   Prediabetes       Relevant Orders   Hemoglobin A1c   Encounter for immunization       Relevant Orders   Varicella-zoster vaccine IM (Completed)      No orders of the defined types were placed in this encounter.   Follow-up: Return in about 4 months (around 09/12/2023) for Annual physical.    Anabel Halon, MD

## 2023-05-13 NOTE — Assessment & Plan Note (Addendum)
Rise in S. Cr. since hospitalization for COVID Last BMP stable, GFR 56 Avoid nephrotoxic agents On Lisinopril for HTN Reviewed UA - no gross proteinuria

## 2023-05-13 NOTE — Patient Instructions (Signed)
Please continue to take medications as prescribed.  Please continue to follow low carb diet and perform moderate exercise/walking at least 150 mins/week.  Please get fasting blood tests done before the next visit. 

## 2023-05-13 NOTE — Assessment & Plan Note (Signed)
BP Readings from Last 1 Encounters:  05/13/23 117/83   Well-controlled with Lisinopril 5 mg QD Counseled for compliance with the medications Advised DASH diet and moderate exercise/walking, at least 150 mins/week

## 2023-05-13 NOTE — Assessment & Plan Note (Signed)
Ordered PSA after discussing limitations of PSA screening including false positive result leading to additional testing. 

## 2023-05-13 NOTE — Assessment & Plan Note (Signed)
Quit smoking in 2013, has > 20-pack-year smoking history Ordered low-dose CT chest after discussing with the patient. 

## 2023-06-04 DIAGNOSIS — M791 Myalgia, unspecified site: Secondary | ICD-10-CM | POA: Diagnosis not present

## 2023-06-04 DIAGNOSIS — M542 Cervicalgia: Secondary | ICD-10-CM | POA: Diagnosis not present

## 2023-06-04 DIAGNOSIS — M25511 Pain in right shoulder: Secondary | ICD-10-CM | POA: Diagnosis not present

## 2023-06-07 ENCOUNTER — Other Ambulatory Visit: Payer: Self-pay | Admitting: Internal Medicine

## 2023-06-07 DIAGNOSIS — I10 Essential (primary) hypertension: Secondary | ICD-10-CM

## 2023-09-01 DIAGNOSIS — E782 Mixed hyperlipidemia: Secondary | ICD-10-CM | POA: Diagnosis not present

## 2023-09-01 DIAGNOSIS — N1831 Chronic kidney disease, stage 3a: Secondary | ICD-10-CM | POA: Diagnosis not present

## 2023-09-01 DIAGNOSIS — R7303 Prediabetes: Secondary | ICD-10-CM | POA: Diagnosis not present

## 2023-09-01 DIAGNOSIS — I10 Essential (primary) hypertension: Secondary | ICD-10-CM | POA: Diagnosis not present

## 2023-09-01 DIAGNOSIS — E559 Vitamin D deficiency, unspecified: Secondary | ICD-10-CM | POA: Diagnosis not present

## 2023-09-01 DIAGNOSIS — Z125 Encounter for screening for malignant neoplasm of prostate: Secondary | ICD-10-CM | POA: Diagnosis not present

## 2023-09-03 LAB — CBC WITH DIFFERENTIAL/PLATELET
Basophils Absolute: 0 10*3/uL (ref 0.0–0.2)
Basos: 1 %
EOS (ABSOLUTE): 0.3 10*3/uL (ref 0.0–0.4)
Eos: 6 %
Hematocrit: 46 % (ref 37.5–51.0)
Hemoglobin: 15.3 g/dL (ref 13.0–17.7)
Immature Grans (Abs): 0 10*3/uL (ref 0.0–0.1)
Immature Granulocytes: 0 %
Lymphocytes Absolute: 1.4 10*3/uL (ref 0.7–3.1)
Lymphs: 29 %
MCH: 33.6 pg — ABNORMAL HIGH (ref 26.6–33.0)
MCHC: 33.3 g/dL (ref 31.5–35.7)
MCV: 101 fL — ABNORMAL HIGH (ref 79–97)
Monocytes Absolute: 0.4 10*3/uL (ref 0.1–0.9)
Monocytes: 8 %
Neutrophils Absolute: 2.7 10*3/uL (ref 1.4–7.0)
Neutrophils: 56 %
Platelets: 212 10*3/uL (ref 150–450)
RBC: 4.55 x10E6/uL (ref 4.14–5.80)
RDW: 12.3 % (ref 11.6–15.4)
WBC: 4.7 10*3/uL (ref 3.4–10.8)

## 2023-09-03 LAB — LIPID PANEL
Chol/HDL Ratio: 3.8 {ratio} (ref 0.0–5.0)
Cholesterol, Total: 188 mg/dL (ref 100–199)
HDL: 50 mg/dL (ref >39)
LDL Chol Calc (NIH): 117 mg/dL — ABNORMAL HIGH (ref 0–99)
Triglycerides: 118 mg/dL (ref 0–149)
VLDL Cholesterol Cal: 21 mg/dL (ref 5–40)

## 2023-09-03 LAB — HEMOGLOBIN A1C
Est. average glucose Bld gHb Est-mCnc: 97 mg/dL
Hgb A1c MFr Bld: 5 % (ref 4.8–5.6)

## 2023-09-03 LAB — CMP14+EGFR
ALT: 21 [IU]/L (ref 0–44)
AST: 18 [IU]/L (ref 0–40)
Albumin: 4.4 g/dL (ref 3.8–4.9)
Alkaline Phosphatase: 73 [IU]/L (ref 44–121)
BUN/Creatinine Ratio: 12 (ref 10–24)
BUN: 16 mg/dL (ref 8–27)
Bilirubin Total: 0.5 mg/dL (ref 0.0–1.2)
CO2: 21 mmol/L (ref 20–29)
Calcium: 9.8 mg/dL (ref 8.6–10.2)
Chloride: 104 mmol/L (ref 96–106)
Creatinine, Ser: 1.36 mg/dL — ABNORMAL HIGH (ref 0.76–1.27)
Globulin, Total: 2.4 g/dL (ref 1.5–4.5)
Glucose: 100 mg/dL — ABNORMAL HIGH (ref 70–99)
Potassium: 4.8 mmol/L (ref 3.5–5.2)
Sodium: 144 mmol/L (ref 134–144)
Total Protein: 6.8 g/dL (ref 6.0–8.5)
eGFR: 60 mL/min/{1.73_m2} (ref >59)

## 2023-09-03 LAB — TSH: TSH: 1.43 u[IU]/mL (ref 0.450–4.500)

## 2023-09-03 LAB — VITAMIN D 25 HYDROXY (VIT D DEFICIENCY, FRACTURES): Vit D, 25-Hydroxy: 64.2 ng/mL (ref 30.0–100.0)

## 2023-09-03 LAB — PSA: Prostate Specific Ag, Serum: 0.3 ng/mL (ref 0.0–4.0)

## 2023-09-10 ENCOUNTER — Other Ambulatory Visit: Payer: Self-pay

## 2023-09-10 DIAGNOSIS — Z87891 Personal history of nicotine dependence: Secondary | ICD-10-CM

## 2023-09-10 DIAGNOSIS — Z122 Encounter for screening for malignant neoplasm of respiratory organs: Secondary | ICD-10-CM

## 2023-09-15 ENCOUNTER — Ambulatory Visit (INDEPENDENT_AMBULATORY_CARE_PROVIDER_SITE_OTHER): Payer: BC Managed Care – PPO | Admitting: Adult Health

## 2023-09-15 ENCOUNTER — Encounter: Payer: Self-pay | Admitting: Adult Health

## 2023-09-15 ENCOUNTER — Ambulatory Visit: Payer: BC Managed Care – PPO | Admitting: Internal Medicine

## 2023-09-15 ENCOUNTER — Encounter: Payer: Self-pay | Admitting: Internal Medicine

## 2023-09-15 ENCOUNTER — Encounter: Payer: BC Managed Care – PPO | Admitting: Adult Health

## 2023-09-15 VITALS — BP 129/87 | HR 62 | Ht 72.0 in | Wt 272.2 lb

## 2023-09-15 DIAGNOSIS — Z87891 Personal history of nicotine dependence: Secondary | ICD-10-CM

## 2023-09-15 DIAGNOSIS — E782 Mixed hyperlipidemia: Secondary | ICD-10-CM | POA: Diagnosis not present

## 2023-09-15 DIAGNOSIS — Z122 Encounter for screening for malignant neoplasm of respiratory organs: Secondary | ICD-10-CM

## 2023-09-15 DIAGNOSIS — N1831 Chronic kidney disease, stage 3a: Secondary | ICD-10-CM

## 2023-09-15 DIAGNOSIS — I10 Essential (primary) hypertension: Secondary | ICD-10-CM | POA: Diagnosis not present

## 2023-09-15 DIAGNOSIS — Z23 Encounter for immunization: Secondary | ICD-10-CM

## 2023-09-15 DIAGNOSIS — Z0001 Encounter for general adult medical examination with abnormal findings: Secondary | ICD-10-CM

## 2023-09-15 NOTE — Assessment & Plan Note (Addendum)
BP Readings from Last 1 Encounters:  09/15/23 129/87   Well-controlled with Lisinopril 5 mg QD Counseled for compliance with the medications Advised DASH diet and moderate exercise/walking, at least 150 mins/week

## 2023-09-15 NOTE — Assessment & Plan Note (Addendum)
Physical exam as documented. Fasting blood tests reviewed today.

## 2023-09-15 NOTE — Progress Notes (Signed)
Established Patient Office Visit  Subjective:  Patient ID: Jackson Figueroa, male    DOB: 03-Aug-1961  Age: 62 y.o. MRN: [id number]  CC:  Chief Complaint  Patient presents with   Annual Exam    HPI Calhoun Troxell is a 62 y.o. male with past medical history of HTN and CKD stage 3a who presents for annual physical.  HTN: BP is well-controlled. Takes medications regularly. Patient denies headache, dizziness, chest pain, dyspnea or palpitations.   CKD stage 3a: His serum creatinine was 1.36 and GFR was 60, stable. His serum creatinine has been elevated since prolonged hospitalization for COVID in 2021.  He needs to improve fluid intake as well.  Does not report taking any NSAIDs currently.  He is on lisinopril for HTN now.  He denies any dysuria, hematuria or urinary hesitancy or resistance.  Denies any pelvic or flank pain currently.   He has started working again in [place].   He received second dose of Shingrix and flu vaccine today.    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    Tendinitis of elbow    right   Urinary incontinence 03/07/2021    Past Surgical History:  Procedure Laterality Date   APPENDECTOMY  1982   COLONOSCOPY WITH PROPOFOL N/A 06/18/2021   Procedure: COLONOSCOPY WITH PROPOFOL;  Surgeon: Dolores Frame, MD;  Location: [facility];  Service: Gastroenterology;  Laterality: N/A;  9:15   HERNIA REPAIR N/A    [employer]reesia 03/03/2021   INGUINAL HERNIA REPAIR Left 2000   IR GASTROSTOMY TUBE MOD SED  11/21/2020   IR GASTROSTOMY TUBE  REMOVAL  01/17/2021   KNEE ARTHROSCOPY     bilateral knees   POLYPECTOMY  06/18/2021   Procedure: POLYPECTOMY INTESTINAL;  Surgeon: Dolores Frame, MD;  Location: [facility];  Service: Gastroenterology;;   TONSILLECTOMY AND ADENOIDECTOMY     TRACHEOSTOMY TUBE PLACEMENT N/A 11/14/2020   Procedure: TRACHEOSTOMY;  Surgeon: Christia Reading, MD;  Location: [facility];  Service: ENT;  Laterality: N/A;    Family History  Problem Relation Age of Onset   Cerebral aneurysm Mother 45   Heart disease Father    Migraines Sister     Social History   Socioeconomic History   Marital status: Significant Other    Spouse name: Not on file   Number of children: Not on file   Years of education: Not on file   Highest education level: Bachelor's degree (e.g., BA, AB, BS)  Occupational History   Not on file  Tobacco Use   Smoking status: Former    Current packs/day: 0.00    Average packs/day: 1 pack/day for 30.0 years (30.0 ttl pk-yrs)    Types: Cigarettes    Start date: 03/23/1982    Quit date: 03/23/2012    Years since quitting: 11.4   Smokeless tobacco: Never  Vaping Use   Vaping status: Never Used  Substance and Sexual Activity   Alcohol use: Yes  Alcohol/week: 2.0 standard drinks of alcohol    Types: 2 Glasses of wine per week    Comment: occasional wine   Drug use: Never   Sexual activity: Not on file  Other Topics Concern   Not on file  Social History Narrative   Not on file   Social Determinants of Health   Financial Resource Strain: Low Risk  (05/12/2023)   Overall Financial Resource Strain (CARDIA)    Difficulty of Paying Living Expenses: Not very hard  Food Insecurity: No Food Insecurity (05/12/2023)   Hunger Vital Sign    Worried About Running Out of Food in the Last Year: Never true    Ran Out of Food in the Last Year: Never true  Transportation Needs: No Transportation Needs (05/12/2023)   PRAPARE - Administrator, Civil Service (Medical): No    Lack  of Transportation (Non-Medical): No  Physical Activity: Insufficiently Active (05/12/2023)   Exercise Vital Sign    Days of Exercise per Week: 4 days    Minutes of Exercise per Session: 20 min  Stress: No Stress Concern Present (05/12/2023)   [employer] of Occupational Health - Occupational Stress Questionnaire    Feeling of Stress : Only a little  Social Connections: Socially Isolated (05/12/2023)   Social Connection and Isolation Panel [NHANES]    Frequency of Communication with Friends and Family: Never    Frequency of Social Gatherings with Friends and Family: Never    Attends Religious Services: Never    Database administrator or Organizations: No    Attends Engineer, structural: Not on file    Marital Status: Divorced  Catering manager Violence: Not on file    Outpatient Medications Prior to Visit  Medication Sig Dispense Refill   Cholecalciferol (VITAMIN D3) 125 MCG (5000 UT) CAPS Take 5,000 Units by mouth daily.     famotidine (PEPCID) 10 MG tablet Take 10 mg by mouth daily.     fluticasone (FLONASE) 50 MCG/ACT nasal spray Place 2 sprays into both nostrils daily. 16 g 6   lisinopril (ZESTRIL) 5 MG tablet TAKE 1 TABLET(5 MG) BY MOUTH DAILY 90 tablet 1   Misc Natural Products (GINSENG COMPLEX PO) Take 1,000 mg by mouth daily.     Multiple Vitamin (MULTIVITAMIN WITH MINERALS) TABS tablet Take 1 tablet by mouth daily.     pyridoxine (B-6) 100 MG tablet Take 100 mg by mouth daily.     zinc gluconate 50 MG tablet Take 50 mg by mouth daily.     acetaminophen (TYLENOL) 325 MG tablet Take 1-2 tablets (325-650 mg total) by mouth every 4 (four) hours as needed for mild pain. (Patient not taking: Reported on 09/15/2023)     cyclobenzaprine (FLEXERIL) 10 MG tablet Take 1 tablet by mouth at bedtime as needed.     diclofenac Sodium (VOLTAREN) 1 % GEL Apply 2 g topically 4 (four) times daily. (Patient taking differently: Apply 2 g topically 4 (four) times daily as needed (pain).)  350 g 0   Capsaicin 0.025 % LOTN Apply 1 application topically daily. (Patient not taking: Reported on 09/15/2023) 113.2 mL 1   clindamycin (CLEOCIN) 300 MG capsule Take 1 capsule by mouth 3 (three) times daily. (Patient not taking: Reported on 09/15/2023)     diphenhydrAMINE (BENADRYL) 25 MG tablet Take 25 mg by mouth at bedtime. (Patient not taking: Reported on 09/15/2023)     ketoconazole (NIZORAL) 2 % cream APPLY TOPICALLY TO THE AFFECTED AREA TWICE DAILY  AS NEEDED FOR IRRITATION (Patient not taking: Reported on 09/15/2023)     phenylephrine (SUDAFED PE) 10 MG TABS tablet Take 10 mg by mouth every 4 (four) hours as needed (congestion). (Patient not taking: Reported on 09/15/2023)     predniSONE (DELTASONE) 10 MG tablet TAKE 3 TABLET BY MOUTH EVERY DAY 2 DAYS 2 TABS DAILY 5 DAYS THEN 1 DAILY UNTIL GONE (Patient not taking: Reported on 09/15/2023)     tiZANidine (ZANAFLEX) 4 MG tablet Take 1 tablet (4 mg total) by mouth every 8 (eight) hours as needed for muscle spasms. Do not take with alcohol or while driving or operating heavy machinery.  May cause drowsiness. 30 tablet 0   No facility-administered medications prior to visit.    No Known Allergies  ROS Review of Systems  Constitutional:  Negative for chills and fever.  HENT:  Negative for congestion and sore throat.   Eyes:  Negative for pain and discharge.  Respiratory:  Negative for cough and shortness of breath.   Cardiovascular:  Negative for chest pain and palpitations.  Gastrointestinal:  Negative for diarrhea, nausea and vomiting.  Endocrine: Negative for polydipsia and polyuria.  Genitourinary:  Negative for dysuria and hematuria.  Musculoskeletal:  Positive for arthralgias and back pain. Negative for neck pain and neck stiffness.  Skin:  Negative for rash.  Neurological:  Negative for dizziness, weakness, numbness and headaches.  Psychiatric/Behavioral:  Negative for agitation and behavioral problems.       Objective:     Physical Exam Vitals reviewed.  Constitutional:      General: He is not in acute distress.    Appearance: He is obese. He is not diaphoretic.  HENT:     Head: Normocephalic and atraumatic.     Nose: Nose normal.     Mouth/Throat:     Mouth: Mucous membranes are moist.  Eyes:     General: No scleral icterus.    Extraocular Movements: Extraocular movements intact.  Cardiovascular:     Rate and Rhythm: Normal rate and regular rhythm.     Pulses: Normal pulses.     Heart sounds: Normal heart sounds. No murmur heard. Pulmonary:     Breath sounds: Normal breath sounds. No wheezing or rales.  Abdominal:     Palpations: Abdomen is soft.     Tenderness: There is no abdominal tenderness.  Musculoskeletal:     Cervical back: Neck supple. No tenderness.     Right lower leg: No edema.     Left lower leg: No edema.  Skin:    General: Skin is warm.     Findings: No rash.  Neurological:     General: No focal deficit present.     Mental Status: He is alert and oriented to person, place, and time.     Cranial Nerves: No cranial nerve deficit.     Sensory: No sensory deficit.     Motor: No weakness.  Psychiatric:        Mood and Affect: Mood normal.        Behavior: Behavior normal.     BP 129/87 (BP Location: Left Arm, Patient Position: Sitting, Cuff Size: Large)   Pulse 62   Ht 6' (1.829 m)   Wt 272 lb 3.2 oz (123.5 kg)   SpO2 94%   BMI 36.92 kg/m  Wt Readings from Last 3 Encounters:  09/15/23 272 lb 3.2 oz (123.5 kg)  05/13/23 279 lb 12.8 oz (126.9 kg)  11/11/22 284 lb 12.8 oz (129.2  kg)    Lab Results  Component Value Date   TSH 1.430 09/01/2023   Lab Results  Component Value Date   WBC 4.7 09/01/2023   HGB 15.3 09/01/2023   HCT 46.0 09/01/2023   MCV 101 (H) 09/01/2023   PLT 212 09/01/2023   Lab Results  Component Value Date   NA 144 09/01/2023   K 4.8 09/01/2023   CO2 21 09/01/2023   GLUCOSE 100 (H) 09/01/2023   BUN 16 09/01/2023   CREATININE 1.36 (H)  09/01/2023   BILITOT 0.5 09/01/2023   ALKPHOS 73 09/01/2023   AST 18 09/01/2023   ALT 21 09/01/2023   PROT 6.8 09/01/2023   ALBUMIN 4.4 09/01/2023   CALCIUM 9.8 09/01/2023   ANIONGAP 10 01/29/2021   EGFR 60 09/01/2023   Lab Results  Component Value Date   CHOL 188 09/01/2023   Lab Results  Component Value Date   HDL 50 09/01/2023   Lab Results  Component Value Date   LDLCALC 117 (H) 09/01/2023   Lab Results  Component Value Date   TRIG 118 09/01/2023   Lab Results  Component Value Date   CHOLHDL 3.8 09/01/2023   Lab Results  Component Value Date   HGBA1C 5.0 09/01/2023      Assessment & Plan:   Problem List Items Addressed This Visit       Cardiovascular and Mediastinum   Essential hypertension    BP Readings from Last 1 Encounters:  09/15/23 129/87   Well-controlled with Lisinopril 5 mg QD Counseled for compliance with the medications Advised DASH diet and moderate exercise/walking, at least 150 mins/week      Relevant Orders   Basic Metabolic Panel (BMET)     Genitourinary   Stage 3a chronic kidney disease (HCC)    Rise in S. Cr. since hospitalization for COVID Last BMP stable, GFR 60 Avoid nephrotoxic agents On Lisinopril for HTN Reviewed UA - no gross proteinuria      Relevant Orders   Basic Metabolic Panel (BMET)   Protein / creatinine ratio, urine     Other   Mixed hyperlipidemia    Lipid profile reviewed Advised to follow DASH diet      Encounter for general adult medical examination with abnormal findings - Primary    Physical exam as documented. Fasting blood tests reviewed today.      Other Visit Diagnoses     Encounter for immunization       Relevant Orders   Flu vaccine trivalent PF, 6mos and older(Flulaval,Afluria,Fluarix,Fluzone) (Completed)   Varicella-zoster vaccine IM (Completed)       No orders of the defined types were placed in this encounter.   Follow-up: Return in about 6 months (around 03/15/2024) for  HTN.    Anabel Halon, MD

## 2023-09-15 NOTE — Patient Instructions (Addendum)
Please continue to take medications as prescribed.  Please continue to follow low salt diet and perform moderate exercise/walking at least 150 mins/week.  Please get blood tests done before the next visit.

## 2023-09-15 NOTE — Assessment & Plan Note (Signed)
Rise in S. Cr. since hospitalization for COVID Last BMP stable, GFR 60 Avoid nephrotoxic agents On Lisinopril for HTN Reviewed UA - no gross proteinuria

## 2023-09-15 NOTE — Assessment & Plan Note (Signed)
Lipid profile reviewed Advised to follow DASH diet 

## 2023-09-15 NOTE — Patient Instructions (Signed)

## 2023-09-15 NOTE — Progress Notes (Signed)
  Virtual Visit via Telephone Note  I connected with Jackson Figueroa  , 09/15/23 9:14 AM by a telemedicine application and verified that I am speaking with the correct person using two identifiers.  Location: Patient: home Provider: home   I discussed the limitations of evaluation and management by telemedicine and the availability of in person appointments. The patient expressed understanding and agreed to proceed.   Shared Decision Making Visit Lung Cancer Screening Program [phone])   Eligibility: 62 y.o. Pack Years Smoking History Calculation =43 pack years (# packs/per year x # years smoked) Recent History of coughing up blood  no Unexplained weight loss? no ( >Than 15 pounds within the last 6 months ) Prior History Lung / other cancer no (Diagnosis within the last 5 years already requiring surveillance chest CT Scans). Smoking Status Former Smoker Former Smokers: Years since quit: 10 years  Quit Date: 2014  Visit Components: Discussion included one or more decision making aids. YES Discussion included risk/benefits of screening. YES Discussion included potential follow up diagnostic testing for abnormal scans. YES Discussion included meaning and risk of over diagnosis. YES Discussion included meaning and risk of False Positives. YES Discussion included meaning of total radiation exposure. YES  Counseling Included: Importance of adherence to annual lung cancer LDCT screening. YES Impact of comorbidities on ability to participate in the program. YES Ability and willingness to under diagnostic treatment. YES  Smoking Cessation Counseling: Former Smokers:  Discussed the importance of maintaining cigarette abstinence. yes Diagnosis Code: Personal History of Nicotine Dependence. I69.629 Information about tobacco cessation classes and interventions provided to patient. Yes Patient provided with "ticket" for LDCT Scan. yes Written Order for Lung Cancer Screening with LDCT  placed in Epic. Yes (CT Chest Lung Cancer Screening Low Dose W/O CM) [id number]  Z12.2-Screening of respiratory organs Z87.891-Personal history of nicotine dependence   Jackson Figueroa 09/15/23

## 2023-10-01 ENCOUNTER — Ambulatory Visit (HOSPITAL_COMMUNITY)
Admission: RE | Admit: 2023-10-01 | Discharge: 2023-10-01 | Disposition: A | Discharge status: Home / Self Care | Payer: BC Managed Care – PPO | Source: Ambulatory Visit | Attending: Acute Care | Admitting: Acute Care

## 2023-10-01 DIAGNOSIS — Z122 Encounter for screening for malignant neoplasm of respiratory organs: Secondary | ICD-10-CM | POA: Diagnosis not present

## 2023-10-01 DIAGNOSIS — Z87891 Personal history of nicotine dependence: Secondary | ICD-10-CM | POA: Insufficient documentation

## 2023-10-01 DIAGNOSIS — F17210 Nicotine dependence, cigarettes, uncomplicated: Secondary | ICD-10-CM | POA: Diagnosis not present

## 2023-10-28 ENCOUNTER — Telehealth: Payer: Self-pay | Admitting: *Deleted

## 2023-10-28 DIAGNOSIS — R911 Solitary pulmonary nodule: Secondary | ICD-10-CM

## 2023-10-28 NOTE — Telephone Encounter (Signed)
Attempted to contact patient. Had to leave VM for pt to call regarding lung screening CT results.

## 2023-11-01 NOTE — Addendum Note (Signed)
Addended by: Sheran Luz on: 11/01/2023 09:54 AM   Modules accepted: Orders

## 2023-11-01 NOTE — Telephone Encounter (Signed)
Called and spoke to pt regarding results of LDCT from 09/28/2023, impression below. Informed him of the 23m nodules and recommendations for 6 month follow up scan. Along with noted emphysema, bronchiectasis and coronary artery calcifications. Patient states he is not on a statin. Patient is aware we will forward results to PCP and he can discuss the findings and treatment further with PCP. New order placed for 03/2024 scan. Pt verbalized understanding and denied any further questions or concerns at this time.

## 2023-12-22 ENCOUNTER — Other Ambulatory Visit: Payer: Self-pay | Admitting: Internal Medicine

## 2023-12-22 DIAGNOSIS — I10 Essential (primary) hypertension: Secondary | ICD-10-CM

## 2023-12-24 NOTE — Progress Notes (Unsigned)
   There were no vitals taken for this visit.  There is no height or weight on file to calculate BMI.  No chief complaint on file.   No diagnosis found.  DOI/DOS/ {Date:23773::"***"}  {CHL AMB ORT SYMPTOMS POST TREATMENT:21798}

## 2023-12-27 ENCOUNTER — Ambulatory Visit (INDEPENDENT_AMBULATORY_CARE_PROVIDER_SITE_OTHER): Payer: BC Managed Care – PPO | Admitting: Orthopedic Surgery

## 2023-12-27 ENCOUNTER — Encounter: Payer: Self-pay | Admitting: Orthopedic Surgery

## 2023-12-27 ENCOUNTER — Other Ambulatory Visit (INDEPENDENT_AMBULATORY_CARE_PROVIDER_SITE_OTHER): Payer: Self-pay

## 2023-12-27 VITALS — BP 161/117 | HR 64 | Ht 72.0 in | Wt 272.0 lb

## 2023-12-27 DIAGNOSIS — M25462 Effusion, left knee: Secondary | ICD-10-CM

## 2023-12-27 DIAGNOSIS — M1712 Unilateral primary osteoarthritis, left knee: Secondary | ICD-10-CM | POA: Diagnosis not present

## 2023-12-27 DIAGNOSIS — M25562 Pain in left knee: Secondary | ICD-10-CM | POA: Diagnosis not present

## 2023-12-27 MED ORDER — METHYLPREDNISOLONE ACETATE 40 MG/ML IJ SUSP
40.0000 mg | Freq: Once | INTRAMUSCULAR | Status: AC
  Administered 2023-12-27: 40 mg via INTRA_ARTICULAR

## 2023-12-27 NOTE — Patient Instructions (Signed)
 You have received an injection of steroids into the joint. 15% of patients will have increased pain within the 24 hours postinjection.   This is transient and will go away.   We recommend that you use ice packs on the injection site for 20 minutes every 2 hours and extra strength Tylenol 2 tablets every 8 as needed until the pain resolves.  If you continue to have pain after taking the Tylenol and using the ice please call the office for further instructions.

## 2023-12-27 NOTE — Progress Notes (Signed)
Nurses note    BP (!) 161/117   Pulse 64   Ht 6' (1.829 m)   Wt 272 lb (123.4 kg)   BMI 36.89 kg/m   Body mass index is 36.89 kg/m.  Chief Complaint  Patient presents with   Knee Pain    Left Date: about 3 weeks ago has had increase knee pain since exercise on home gym equipment    Encounter Diagnosis  Name Primary?   Acute pain of left knee Yes    DOI/DOS/ Date: about 3 weeks ago has had increase knee pain since exercise on home gym equipment  Worse / has tried ice tumeric CBD cream voltaren gel and Tylenol, caution with NSAIDS due to kidney disease    Chief Complaint  Patient presents with   Knee Pain    Left Date: about 3 weeks ago has had increase knee pain since exercise on home gym equipment   63 year old male had left knee arthroscopy did well until about a year ago and then worsened over the last 3 weeks.  Some of his symptoms related to exercising on a home gym  Complains of pain swelling medial side left knee with a popping sensation  He is noted to have some kidney dysfunction and is not allowed to take anti-inflammatories  He has been able to function for other well he does have a large effusion which she says is chronic  According to his record he has stage IIIa chronic kidney disease and has taken Tylenol  Exam shows a well-developed well-nourished male ambulatory with no assistive devices  He is awake alert and oriented x 3.  He has no obvious vascular issues sensation is normal in his left lower extremity.  His knee is stable muscle tone is normal skin is intact there is no rash lesions or ulcerations there is no erythema  He really does not have a lot of tenderness around the joint but he has a large effusion his range of motion is maintained  Imaging  DG Knee AP/LAT W/Sunrise Left Result Date: 12/27/2023 Left knee series Left knee pain Previous arthroscopy X-ray shows narrowing of the medial compartment mild varus alignment Osteophytes throughout  the joint including patellofemoral joint and the medial compartment Medial compartment narrowing is rather significant at greater than 75% Impression OA left knee with secondary bone changes including osteophyte formation and varus alignment   Assessment and plan  Encounter Diagnosis  Name Primary?   Acute pain of left knee Yes    Recommend aspiration injection left knee Discuss anti-inflammatories with Dr. Allena Katz Return in 6 months x-ray  Aspiration injection left knee  Verbal consent given to aspirate inject left knee Timeout completed to confirm the site is left knee  Superolateral approach used 18-gauge needle we aspirated 260 cc of clear yellow fluid we injected 40 mg of Depo-Medrol 2 cc 1% lidocaine  This was well-tolerated without complication

## 2024-01-18 ENCOUNTER — Other Ambulatory Visit: Payer: Self-pay | Admitting: Medical Genetics

## 2024-03-07 ENCOUNTER — Other Ambulatory Visit (HOSPITAL_COMMUNITY)
Admission: RE | Admit: 2024-03-07 | Discharge: 2024-03-07 | Disposition: A | Discharge status: Home / Self Care | Payer: Self-pay | Source: Ambulatory Visit | Attending: Medical Genetics | Admitting: Medical Genetics

## 2024-03-07 ENCOUNTER — Encounter: Payer: Self-pay | Admitting: Internal Medicine

## 2024-03-09 ENCOUNTER — Other Ambulatory Visit (HOSPITAL_COMMUNITY)
Admission: RE | Admit: 2024-03-09 | Discharge: 2024-03-09 | Disposition: A | Discharge status: Home / Self Care | Payer: Self-pay | Source: Ambulatory Visit | Attending: Internal Medicine | Admitting: Internal Medicine

## 2024-03-09 DIAGNOSIS — I129 Hypertensive chronic kidney disease with stage 1 through stage 4 chronic kidney disease, or unspecified chronic kidney disease: Secondary | ICD-10-CM | POA: Insufficient documentation

## 2024-03-09 DIAGNOSIS — N1831 Chronic kidney disease, stage 3a: Secondary | ICD-10-CM | POA: Insufficient documentation

## 2024-03-09 LAB — BASIC METABOLIC PANEL WITH GFR
Anion gap: 10 (ref 5–15)
BUN: 18 mg/dL (ref 8–23)
CO2: 24 mmol/L (ref 22–32)
Calcium: 9.5 mg/dL (ref 8.9–10.3)
Chloride: 103 mmol/L (ref 98–111)
Creatinine, Ser: 1.26 mg/dL — ABNORMAL HIGH (ref 0.61–1.24)
GFR, Estimated: >60 mL/min (ref >60)
Glucose, Bld: 96 mg/dL (ref 70–99)
Potassium: 4.6 mmol/L (ref 3.5–5.1)
Sodium: 137 mmol/L (ref 135–145)

## 2024-03-09 LAB — PROTEIN / CREATININE RATIO, URINE
Creatinine, Urine: 99 mg/dL
Protein Creatinine Ratio: 0.08 mg/mg{creat} (ref 0.00–0.15)
Total Protein, Urine: 8 mg/dL

## 2024-03-16 ENCOUNTER — Encounter: Payer: Self-pay | Admitting: Internal Medicine

## 2024-03-16 ENCOUNTER — Ambulatory Visit (INDEPENDENT_AMBULATORY_CARE_PROVIDER_SITE_OTHER): Payer: BC Managed Care – PPO | Admitting: Internal Medicine

## 2024-03-16 VITALS — BP 118/83 | HR 75 | Ht 72.0 in | Wt 272.2 lb

## 2024-03-16 DIAGNOSIS — I10 Essential (primary) hypertension: Secondary | ICD-10-CM

## 2024-03-16 DIAGNOSIS — N1831 Chronic kidney disease, stage 3a: Secondary | ICD-10-CM

## 2024-03-16 DIAGNOSIS — M172 Bilateral post-traumatic osteoarthritis of knee: Secondary | ICD-10-CM | POA: Insufficient documentation

## 2024-03-16 DIAGNOSIS — M1732 Unilateral post-traumatic osteoarthritis, left knee: Secondary | ICD-10-CM

## 2024-03-16 NOTE — Assessment & Plan Note (Signed)
Rise in S. Cr. since hospitalization for COVID Last BMP stable, GFR 60 Avoid nephrotoxic agents On Lisinopril for HTN Reviewed UA - no gross proteinuria

## 2024-03-16 NOTE — Assessment & Plan Note (Signed)
 Left knee pain, has history of traumatic meniscal tear of knee Followed by orthopedic surgeon Tylenol  arthritis as needed for pain Avoid oral NSAIDs due to CKD Has been advised for right TKA, but he prefers to wait for now

## 2024-03-16 NOTE — Assessment & Plan Note (Signed)
 BP Readings from Last 1 Encounters:  03/16/24 118/83   Well-controlled with Lisinopril  5 mg QD Counseled for compliance with the medications Advised DASH diet and moderate exercise/walking, at least 150 mins/week

## 2024-03-16 NOTE — Progress Notes (Signed)
 Established Patient Office Visit  Subjective:  Patient ID: Jackson Figueroa, male    DOB: 12/09/1960  Age: 63 y.o. MRN: [id number]  CC:  Chief Complaint  Patient presents with   Medical Management of Chronic Issues    Follow up for hypertension. Reports knee pain on and off more so on his left knee.     HPI Jackson Figueroa is a 63 y.o. male with past medical history of HTN and CKD stage 3a who presents for f/u of his chronic medical conditions.  HTN: BP is well-controlled. Takes medications regularly. Patient denies headache, dizziness, chest pain, dyspnea or palpitations.   CKD stage 3a: His serum creatinine was 1.26 and GFR was 60, stable. His serum creatinine has been elevated since prolonged hospitalization for COVID in 2021.  He needs to improve fluid intake as well.  Does not report taking any NSAIDs currently.  He is on lisinopril  for HTN now.  He denies any dysuria, hematuria or urinary hesitancy or resistance.  Denies any pelvic or flank pain currently.  He reports chronic left knee pain.  He has history of meniscal tear, s/p arthroscopy.  He was seen by Dr. Phyllis Breeze in the past for OA of knee, and was advised right TKA, but he wanted to pursue weight loss before it.  He is currently using a topical patch named X39 lifewave with some relief.  He agrees to take Tylenol  arthritis as needed for pain.    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    Tendinitis of elbow    right   Urinary incontinence 03/07/2021    Past Surgical History:  Procedure Laterality  Date   APPENDECTOMY  1982   COLONOSCOPY WITH PROPOFOL  N/A 06/18/2021   Procedure: COLONOSCOPY WITH PROPOFOL ;  Surgeon: Urban Garden, MD;  Location: [facility];  Service: Gastroenterology;  Laterality: N/A;  9:15   HERNIA REPAIR N/A    [employer] 03/03/2021   INGUINAL HERNIA REPAIR Left 2000   IR GASTROSTOMY TUBE MOD SED  11/21/2020   IR GASTROSTOMY TUBE REMOVAL  01/17/2021   KNEE ARTHROSCOPY     bilateral knees   POLYPECTOMY  06/18/2021   Procedure: POLYPECTOMY INTESTINAL;  Surgeon: Urban Garden, MD;  Location: [facility];  Service: Gastroenterology;;   TONSILLECTOMY AND ADENOIDECTOMY     TRACHEOSTOMY TUBE PLACEMENT N/A 11/14/2020   Procedure: TRACHEOSTOMY;  Surgeon: Virgina Grills, MD;  Location: [facility];  Service: ENT;  Laterality: N/A;    Family History  Problem Relation Age of Onset   Cerebral aneurysm Mother 67   Heart disease Father    Migraines Sister     Social History   Socioeconomic History   Marital status: Significant Other    Spouse name: Not on file   Number of children: Not on file   Years of education: Not on file   Highest education level: Bachelor's degree (e.g., BA, AB, BS)  Occupational History   Not on file  Tobacco Use  Smoking status: Former    Current packs/day: 0.00    Average packs/day: 1 pack/day for 30.0 years (30.0 ttl pk-yrs)    Types: Cigarettes    Start date: 03/23/1982    Quit date: 03/23/2012    Years since quitting: 11.9   Smokeless tobacco: Never  Vaping Use   Vaping status: Never Used  Substance and Sexual Activity   Alcohol use: Yes    Alcohol/week: 2.0 standard drinks of alcohol    Types: 2 Glasses of wine per week    Comment: occasional wine   Drug use: Never   Sexual activity: Not on file  Other Topics Concern   Not on file  Social History Narrative   Not on file   Social Drivers of Health   Financial Resource Strain: Medium Risk (03/13/2024)   Overall Financial Resource Strain (CARDIA)     Difficulty of Paying Living Expenses: Somewhat hard  Food Insecurity: No Food Insecurity (03/13/2024)   Hunger Vital Sign    Worried About Running Out of Food in the Last Year: Never true    Ran Out of Food in the Last Year: Never true  Transportation Needs: No Transportation Needs (03/13/2024)   PRAPARE - Administrator, Civil Service (Medical): No    Lack of Transportation (Non-Medical): No  Physical Activity: Insufficiently Active (03/13/2024)   Exercise Vital Sign    Days of Exercise per Week: 3 days    Minutes of Exercise per Session: 30 min  Stress: No Stress Concern Present (03/13/2024)   [employer] of Occupational Health - Occupational Stress Questionnaire    Feeling of Stress : Only a little  Social Connections: Socially Isolated (03/13/2024)   Social Connection and Isolation Panel [NHANES]    Frequency of Communication with Friends and Family: Never    Frequency of Social Gatherings with Friends and Family: Never    Attends Religious Services: Never    Database administrator or Organizations: No    Attends Engineer, structural: Not on file    Marital Status: Living with partner  Intimate Partner Violence: Not on file    Outpatient Medications Prior to Visit  Medication Sig Dispense Refill   acetaminophen  (TYLENOL ) 325 MG tablet Take 1-2 tablets (325-650 mg total) by mouth every 4 (four) hours as needed for mild pain.     Cholecalciferol (VITAMIN D3) 125 MCG (5000 UT) CAPS Take 5,000 Units by mouth daily.     cyclobenzaprine  (FLEXERIL ) 10 MG tablet Take 1 tablet by mouth at bedtime as needed.     diclofenac  Sodium (VOLTAREN ) 1 % GEL Apply 2 g topically 4 (four) times daily. (Patient taking differently: Apply 2 g topically 4 (four) times daily as needed (pain).) 350 g 0   famotidine  (PEPCID ) 10 MG tablet Take 10 mg by mouth daily.     fluticasone  (FLONASE ) 50 MCG/ACT nasal spray Place 2 sprays into both nostrils daily. 16 g 6   lisinopril  (ZESTRIL ) 5  MG tablet TAKE 1 TABLET(5 MG) BY MOUTH DAILY 90 tablet 1   Misc Natural Products (GINSENG COMPLEX PO) Take 1,000 mg by mouth daily.     Multiple Vitamin (MULTIVITAMIN WITH MINERALS) TABS tablet Take 1 tablet by mouth daily.     pyridoxine (B-6) 100 MG tablet Take 100 mg by mouth daily.     zinc  gluconate 50 MG tablet Take 50 mg by mouth daily.     No facility-administered medications prior to visit.    No Known Allergies  ROS Review of Systems  Constitutional:  Negative for chills and fever.  HENT:  Negative for congestion and sore throat.   Eyes:  Negative for pain and discharge.  Respiratory:  Negative for cough and shortness of breath.   Cardiovascular:  Negative for chest pain and palpitations.  Gastrointestinal:  Negative for diarrhea, nausea and vomiting.  Endocrine: Negative for polydipsia and polyuria.  Genitourinary:  Negative for dysuria and hematuria.  Musculoskeletal:  Positive for arthralgias and back pain. Negative for neck pain and neck stiffness.  Skin:  Negative for rash.  Neurological:  Negative for dizziness, weakness, numbness and headaches.  Psychiatric/Behavioral:  Negative for agitation and behavioral problems.       Objective:    Physical Exam Vitals reviewed.  Constitutional:      General: He is not in acute distress.    Appearance: He is obese. He is not diaphoretic.  HENT:     Head: Normocephalic and atraumatic.     Nose: Nose normal.     Mouth/Throat:     Mouth: Mucous membranes are moist.  Eyes:     General: No scleral icterus.    Extraocular Movements: Extraocular movements intact.  Cardiovascular:     Rate and Rhythm: Normal rate and regular rhythm.     Heart sounds: Normal heart sounds. No murmur heard. Pulmonary:     Breath sounds: Normal breath sounds. No wheezing or rales.  Musculoskeletal:     Cervical back: Neck supple. No tenderness.     Right lower leg: No edema.     Left lower leg: No edema.  Skin:    General: Skin is warm.      Findings: No rash.  Neurological:     General: No focal deficit present.     Mental Status: He is alert and oriented to person, place, and time.     Sensory: No sensory deficit.     Motor: No weakness.  Psychiatric:        Mood and Affect: Mood normal.        Behavior: Behavior normal.     BP 118/83   Pulse 75   Ht 6' (1.829 m)   Wt 272 lb 3.2 oz (123.5 kg)   SpO2 93%   BMI 36.92 kg/m  Wt Readings from Last 3 Encounters:  03/16/24 272 lb 3.2 oz (123.5 kg)  12/27/23 272 lb (123.4 kg)  09/15/23 272 lb 3.2 oz (123.5 kg)    Lab Results  Component Value Date   TSH 1.430 09/01/2023   Lab Results  Component Value Date   WBC 4.7 09/01/2023   HGB 15.3 09/01/2023   HCT 46.0 09/01/2023   MCV 101 (H) 09/01/2023   PLT 212 09/01/2023   Lab Results  Component Value Date   NA 137 03/09/2024   K 4.6 03/09/2024   CO2 24 03/09/2024   GLUCOSE 96 03/09/2024   BUN 18 03/09/2024   CREATININE 1.26 (H) 03/09/2024   BILITOT 0.5 09/01/2023   ALKPHOS 73 09/01/2023   AST 18 09/01/2023   ALT 21 09/01/2023   PROT 6.8 09/01/2023   ALBUMIN 4.4 09/01/2023   CALCIUM 9.5 03/09/2024   ANIONGAP 10 03/09/2024   EGFR 60 09/01/2023   Lab Results  Component Value Date   CHOL 188 09/01/2023   Lab Results  Component Value Date   HDL 50 09/01/2023   Lab Results  Component Value Date   LDLCALC 117 (H) 09/01/2023   Lab Results  Component Value Date  TRIG 118 09/01/2023   Lab Results  Component Value Date   CHOLHDL 3.8 09/01/2023   Lab Results  Component Value Date   HGBA1C 5.0 09/01/2023      Assessment & Plan:   Problem List Items Addressed This Visit       Cardiovascular and Mediastinum   Essential hypertension - Primary   BP Readings from Last 1 Encounters:  03/16/24 118/83   Well-controlled with Lisinopril  5 mg QD Counseled for compliance with the medications Advised DASH diet and moderate exercise/walking, at least 150 mins/week        Musculoskeletal and  Integument   Post-traumatic osteoarthritis of left knee   Left knee pain, has history of traumatic meniscal tear of knee Followed by orthopedic surgeon Tylenol  arthritis as needed for pain Avoid oral NSAIDs due to CKD Has been advised for right TKA, but he prefers to wait for now        Genitourinary   Stage 3a chronic kidney disease (HCC)   Rise in S[place] Cr. since hospitalization for COVID Last BMP stable, GFR 60 Avoid nephrotoxic agents On Lisinopril  for HTN Reviewed UA - no gross proteinuria       No orders of the defined types were placed in this encounter.   Follow-up: Return in about 6 months (around 09/15/2024) for HTN (after 09/14/24).    Meldon Sport, MD

## 2024-03-16 NOTE — Patient Instructions (Signed)
 Please continue to take medications as prescribed.  Please continue to follow low carb diet and perform moderate exercise/walking at least 150 mins/week.

## 2024-03-17 LAB — GENECONNECT MOLECULAR SCREEN: Genetic Analysis Overall Interpretation: NEGATIVE

## 2024-04-28 ENCOUNTER — Encounter (INDEPENDENT_AMBULATORY_CARE_PROVIDER_SITE_OTHER): Payer: Self-pay | Admitting: *Deleted

## 2024-05-11 ENCOUNTER — Ambulatory Visit (HOSPITAL_COMMUNITY): Payer: Self-pay

## 2024-06-26 ENCOUNTER — Ambulatory Visit (INDEPENDENT_AMBULATORY_CARE_PROVIDER_SITE_OTHER): Payer: BC Managed Care – PPO | Admitting: Orthopedic Surgery

## 2024-06-26 ENCOUNTER — Encounter: Payer: Self-pay | Admitting: Orthopedic Surgery

## 2024-06-26 DIAGNOSIS — M25461 Effusion, right knee: Secondary | ICD-10-CM

## 2024-06-26 DIAGNOSIS — M1712 Unilateral primary osteoarthritis, left knee: Secondary | ICD-10-CM

## 2024-06-26 DIAGNOSIS — M1711 Unilateral primary osteoarthritis, right knee: Secondary | ICD-10-CM

## 2024-06-26 DIAGNOSIS — M170 Bilateral primary osteoarthritis of knee: Secondary | ICD-10-CM

## 2024-06-26 DIAGNOSIS — M25462 Effusion, left knee: Secondary | ICD-10-CM

## 2024-06-26 MED ORDER — METHYLPREDNISOLONE ACETATE 40 MG/ML IJ SUSP
40.0000 mg | Freq: Once | INTRAMUSCULAR | Status: AC
  Administered 2024-06-26: 40 mg via INTRA_ARTICULAR

## 2024-06-26 NOTE — Patient Instructions (Signed)
 Ice daily   Occasional advil    You have received an injection of steroids into the joint. 15% of patients will have increased pain within the 24 hours postinjection.   This is transient and will go away.   We recommend that you use ice packs on the injection site for 20 minutes every 2 hours and extra strength Tylenol  2 tablets every 8 as needed until the pain resolves.  If you continue to have pain after taking the Tylenol  and using the ice please call the office for further instructions.

## 2024-06-26 NOTE — Progress Notes (Signed)
 Patient ID: Flay Ghosh, male   DOB: [birth year], 63 y.o.   MRN: [id number]    Chief Complaint  Patient presents with   Knee Pain    Left    Bilateral knee pain  63 year old male has some kidney dysfunction so he has been told not to take NSAIDs he has been working as a Fish farm manager some increased pain on the lateral side of his right knee and continued pain medial side left knee  He also had a slight slip where he twisted the right knee and hyperflexed  He comes in today with bilateral knee swelling bilateral knee pain  He does use the Voltaren  gel occasional Advil  and the Tylenol   Medical history was reviewed  Exam shows both knees have a large effusion the right knee a moderate the left knee extremely large effusion however he has excellent flexion in both knees full extension mild crepitance mild tenderness along the joint lines no instability  Previous x-rays show grade 4 arthritis medial left knee 3 right knee  I aspirated and injected both knees  Procedure note injection and aspiration right knee joint  Verbal consent was obtained to aspirate and inject the right knee joint   Timeout was completed to confirm the site of aspiration and injection  An 18-gauge needle was used to aspirate the knee joint from a suprapatellar lateral approach.  The medications used were 40 mg of Depo-Medrol  and 1% lidocaine  3 cc  Anesthesia was provided by ethyl chloride and the skin was prepped with alcohol.  After cleaning the skin with alcohol an 18-gauge needle was used to aspirate the right knee joint.  We obtained 40  cc of fluid clear yellow  We follow this by injection of 40 mg of Depo-Medrol  and 3 cc 1% lidocaine .  There were no complications. A sterile bandage was applied.    Procedure note injection and aspiration left knee joint  Verbal consent was obtained to aspirate and inject the left knee joint   Timeout was completed to confirm the site of  aspiration and injection  An 18-gauge needle was used to aspirate the left knee joint from a suprapatellar lateral approach.  The medications used were 40 mg of Depo-Medrol  and 1% lidocaine  3 cc  Anesthesia was provided by ethyl chloride and the skin was prepped with alcohol.  After cleaning the skin with alcohol an 18-gauge needle was used to aspirate the right knee joint.  We obtained 160 cc of fluid clear yellow  We followed this by injection of 40 mg of Depo-Medrol  and 3 cc 1% lidocaine .  There were no complications. A sterile bandage was applied.   Assessment and plan  Encounter Diagnoses  Name Primary?   Primary osteoarthritis of right knee Yes   Primary osteoarthritis of left knee    Effusion of knee joint, left    Effusion, right knee     I think we can wait on the knee replacements right now we have not really maxed out all her medical options including Orthovisc or Synvisc he can have aspiration injections on a continual basis as needed

## 2024-06-26 NOTE — Progress Notes (Signed)
   There were no vitals taken for this visit.  There is no height or weight on file to calculate BMI.  Chief Complaint  Patient presents with   Knee Pain    Left     No diagnosis found.  DOI/DOS/ Date: ongoing   Unchanged left knee pain also right knee painful now

## 2024-07-04 ENCOUNTER — Other Ambulatory Visit: Payer: Self-pay | Admitting: Internal Medicine

## 2024-07-04 DIAGNOSIS — I10 Essential (primary) hypertension: Secondary | ICD-10-CM

## 2024-09-04 ENCOUNTER — Other Ambulatory Visit (HOSPITAL_COMMUNITY)
Admission: RE | Admit: 2024-09-04 | Discharge: 2024-09-04 | Disposition: A | Discharge status: Home / Self Care | Payer: Self-pay | Source: Ambulatory Visit | Attending: Internal Medicine | Admitting: Internal Medicine

## 2024-09-04 DIAGNOSIS — N1831 Chronic kidney disease, stage 3a: Secondary | ICD-10-CM | POA: Insufficient documentation

## 2024-09-04 LAB — BASIC METABOLIC PANEL WITH GFR
Anion gap: 9 (ref 5–15)
BUN: 17 mg/dL (ref 8–23)
CO2: 27 mmol/L (ref 22–32)
Calcium: 9.6 mg/dL (ref 8.9–10.3)
Chloride: 103 mmol/L (ref 98–111)
Creatinine, Ser: 1.27 mg/dL — ABNORMAL HIGH (ref 0.61–1.24)
GFR, Estimated: >60 mL/min (ref >60)
Glucose, Bld: 100 mg/dL — ABNORMAL HIGH (ref 70–99)
Potassium: 4.5 mmol/L (ref 3.5–5.1)
Sodium: 140 mmol/L (ref 135–145)

## 2024-09-04 LAB — PROTEIN / CREATININE RATIO, URINE
Creatinine, Urine: 77 mg/dL
Protein Creatinine Ratio: 0.08 mg/mg{creat} (ref 0.00–0.15)
Total Protein, Urine: 6 mg/dL

## 2024-09-06 ENCOUNTER — Encounter (INDEPENDENT_AMBULATORY_CARE_PROVIDER_SITE_OTHER): Payer: Self-pay | Admitting: Gastroenterology

## 2024-09-15 ENCOUNTER — Ambulatory Visit: Payer: Self-pay | Admitting: Internal Medicine

## 2024-09-15 ENCOUNTER — Encounter: Payer: Self-pay | Admitting: Internal Medicine

## 2024-09-15 VITALS — BP 133/84 | HR 74 | Ht 72.0 in | Wt 273.4 lb

## 2024-09-15 DIAGNOSIS — Z23 Encounter for immunization: Secondary | ICD-10-CM

## 2024-09-15 DIAGNOSIS — N1831 Chronic kidney disease, stage 3a: Secondary | ICD-10-CM

## 2024-09-15 DIAGNOSIS — I10 Essential (primary) hypertension: Secondary | ICD-10-CM

## 2024-09-15 DIAGNOSIS — M172 Bilateral post-traumatic osteoarthritis of knee: Secondary | ICD-10-CM

## 2024-09-15 MED ORDER — MELOXICAM 7.5 MG PO TABS
7.5000 mg | ORAL_TABLET | Freq: Every day | ORAL | 1 refills | Status: AC
Start: 2024-09-15 — End: ?

## 2024-09-15 NOTE — Assessment & Plan Note (Signed)
Rise in S. Cr. since hospitalization for COVID Last BMP stable, GFR 60 Avoid nephrotoxic agents On Lisinopril for HTN Reviewed UA - no gross proteinuria

## 2024-09-15 NOTE — Progress Notes (Signed)
 Established Patient Office Visit  Subjective:  Patient ID: Jackson Figueroa, male    DOB: [birth year]  Age: 63 y.o. MRN: [id number]  CC:  Chief Complaint  Patient presents with   Follow-up   Knee Pain    Reports knee pain 5 out of 10.     HPI Jackson Figueroa is a 63 y.o. male with past medical history of HTN and CKD stage 3a who presents for f/u of his chronic medical conditions.  HTN: BP is well-controlled. Takes medications regularly. Patient denies headache, dizziness, chest pain, dyspnea or palpitations.   CKD stage 3a: His serum creatinine was 1.27 and GFR was >60, stable. His serum creatinine has been elevated since prolonged hospitalization for COVID in 2021.  He still needs to improve fluid intake. He is on lisinopril  for HTN now.  He denies any dysuria, hematuria or urinary hesitancy or resistance.  Denies any pelvic or flank pain currently.  He reports chronic bilateral knee pain.  He has history of left meniscal tear, s/p arthroscopy.  He was seen by Dr. Margrette for OA of knee, and was advised right TKA, but he wanted to pursue weight loss before it.  He also has had knee aspiration with steroid injection.  He is currently using a topical patch named X39 lifewave with some relief.  He also takes Tylenol  arthritis as needed for pain.    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    Tendinitis of elbow    right   Urinary incontinence 03/07/2021    Past Surgical History:  Procedure Laterality Date   APPENDECTOMY  1982   COLONOSCOPY WITH PROPOFOL  N/A  06/18/2021   Procedure: COLONOSCOPY WITH PROPOFOL ;  Surgeon: Eartha Angelia Sieving, MD;  Location: [facility];  Service: Gastroenterology;  Laterality: N/A;  9:15   HERNIA REPAIR N/A    [employer] 03/03/2021   INGUINAL HERNIA REPAIR Left 2000   IR GASTROSTOMY TUBE MOD SED  11/21/2020   IR GASTROSTOMY TUBE REMOVAL  01/17/2021   KNEE ARTHROSCOPY     bilateral knees   POLYPECTOMY  06/18/2021   Procedure: POLYPECTOMY INTESTINAL;  Surgeon: Eartha Angelia Sieving, MD;  Location: [facility];  Service: Gastroenterology;;   TONSILLECTOMY AND ADENOIDECTOMY     TRACHEOSTOMY TUBE PLACEMENT N/A 11/14/2020   Procedure: TRACHEOSTOMY;  Surgeon: Carlie Clark, MD;  Location: [facility];  Service: ENT;  Laterality: N/A;    Family History  Problem Relation Age of Onset   Cerebral aneurysm Mother 104   Heart disease Father    Migraines Sister     Social History   Socioeconomic History   Marital status: Significant Other    Spouse name: Not on file   Number of children: Not on file   Years of education: Not on file   Highest education level: Bachelor's degree (e.g., BA, AB, BS)  Occupational History   Not on file  Tobacco Use   Smoking status: Former    Current packs/day: 0.00  Average packs/day: 1 pack/day for 30.0 years (30.0 ttl pk-yrs)    Types: Cigarettes    Start date: 03/23/1982    Quit date: 03/23/2012    Years since quitting: 12.4   Smokeless tobacco: Never  Vaping Use   Vaping status: Never Used  Substance and Sexual Activity   Alcohol use: Yes    Alcohol/week: 2.0 standard drinks of alcohol    Types: 2 Glasses of wine per week    Comment: occasional wine   Drug use: Never   Sexual activity: Not on file  Other Topics Concern   Not on file  Social History Narrative   Not on file   Social Drivers of Health   Financial Resource Strain: Medium Risk (09/13/2024)   Overall Financial Resource Strain (CARDIA)    Difficulty of Paying Living Expenses: Somewhat hard  Food  Insecurity: Food Insecurity Present (09/13/2024)   Hunger Vital Sign    Worried About Running Out of Food in the Last Year: Sometimes true    Ran Out of Food in the Last Year: Never true  Transportation Needs: No Transportation Needs (09/13/2024)   PRAPARE - Administrator, Civil Service (Medical): No    Lack of Transportation (Non-Medical): No  Physical Activity: Sufficiently Active (09/13/2024)   Exercise Vital Sign    Days of Exercise per Week: 7 days    Minutes of Exercise per Session: 30 min  Stress: No Stress Concern Present (09/13/2024)   [employer] of Occupational Health - Occupational Stress Questionnaire    Feeling of Stress: Only a little  Social Connections: Socially Isolated (09/13/2024)   Social Connection and Isolation Panel    Frequency of Communication with Friends and Family: Never    Frequency of Social Gatherings with Friends and Family: Never    Attends Religious Services: Never    Database administrator or Organizations: No    Attends Engineer, structural: Not on file    Marital Status: Divorced  Catering manager Violence: Not on file    Outpatient Medications Prior to Visit  Medication Sig Dispense Refill   acetaminophen  (TYLENOL ) 325 MG tablet Take 1-2 tablets (325-650 mg total) by mouth every 4 (four) hours as needed for mild pain.     Cholecalciferol (VITAMIN D3) 125 MCG (5000 UT) CAPS Take 5,000 Units by mouth daily.     diclofenac  Sodium (VOLTAREN ) 1 % GEL Apply 2 g topically 4 (four) times daily. (Patient taking differently: Apply 2 g topically 4 (four) times daily as needed (pain).) 350 g 0   famotidine  (PEPCID ) 10 MG tablet Take 10 mg by mouth daily.     fluticasone  (FLONASE ) 50 MCG/ACT nasal spray Place 2 sprays into both nostrils daily. 16 g 6   lisinopril  (ZESTRIL ) 5 MG tablet TAKE 1 TABLET(5 MG) BY MOUTH DAILY 90 tablet 1   Misc Natural Products (GINSENG COMPLEX PO) Take 1,000 mg by mouth daily.     Multiple Vitamin  (MULTIVITAMIN WITH MINERALS) TABS tablet Take 1 tablet by mouth daily.     zinc  gluconate 50 MG tablet Take 50 mg by mouth daily.     cyclobenzaprine  (FLEXERIL ) 10 MG tablet Take 1 tablet by mouth at bedtime as needed.     pyridoxine (B-6) 100 MG tablet Take 100 mg by mouth daily.     No facility-administered medications prior to visit.    No Known Allergies  ROS Review of Systems  Constitutional:  Negative for chills and fever.  HENT:  Negative for congestion and sore throat.   Eyes:  Negative for pain and discharge.  Respiratory:  Negative for cough and shortness of breath.   Cardiovascular:  Negative for chest pain and palpitations.  Gastrointestinal:  Negative for diarrhea, nausea and vomiting.  Endocrine: Negative for polydipsia and polyuria.  Genitourinary:  Negative for dysuria and hematuria.  Musculoskeletal:  Positive for arthralgias and back pain. Negative for neck pain and neck stiffness.  Skin:  Negative for rash.  Neurological:  Negative for dizziness, weakness, numbness and headaches.  Psychiatric/Behavioral:  Negative for agitation and behavioral problems.       Objective:    Physical Exam Vitals reviewed.  Constitutional:      General: He is not in acute distress.    Appearance: He is obese. He is not diaphoretic.  HENT:     Head: Normocephalic and atraumatic.     Nose: Nose normal.     Mouth/Throat:     Mouth: Mucous membranes are moist.  Eyes:     General: No scleral icterus.    Extraocular Movements: Extraocular movements intact.  Cardiovascular:     Rate and Rhythm: Normal rate and regular rhythm.     Heart sounds: Normal heart sounds. No murmur heard. Pulmonary:     Breath sounds: Normal breath sounds. No wheezing or rales.  Musculoskeletal:     Cervical back: Neck supple. No tenderness.     Right knee: Swelling and bony tenderness present.     Left knee: No swelling.     Right lower leg: No edema.     Left lower leg: No edema.     Comments:  Right knee brace in place  Skin:    General: Skin is warm.     Findings: No rash.  Neurological:     General: No focal deficit present.     Mental Status: He is alert and oriented to person, place, and time.     Sensory: No sensory deficit.     Motor: No weakness.  Psychiatric:        Mood and Affect: Mood normal.        Behavior: Behavior normal.     There were no vitals taken for this visit. Wt Readings from Last 3 Encounters:  03/16/24 272 lb 3.2 oz (123.5 kg)  12/27/23 272 lb (123.4 kg)  09/15/23 272 lb 3.2 oz (123.5 kg)    Lab Results  Component Value Date   TSH 1.430 09/01/2023   Lab Results  Component Value Date   WBC 4.7 09/01/2023   HGB 15.3 09/01/2023   HCT 46.0 09/01/2023   MCV 101 (H) 09/01/2023   PLT 212 09/01/2023   Lab Results  Component Value Date   NA 140 09/04/2024   K 4.5 09/04/2024   CO2 27 09/04/2024   GLUCOSE 100 (H) 09/04/2024   BUN 17 09/04/2024   CREATININE 1.27 (H) 09/04/2024   BILITOT 0.5 09/01/2023   ALKPHOS 73 09/01/2023   AST 18 09/01/2023   ALT 21 09/01/2023   PROT 6.8 09/01/2023   ALBUMIN 4.4 09/01/2023   CALCIUM 9.6 09/04/2024   ANIONGAP 9 09/04/2024   EGFR 60 09/01/2023   Lab Results  Component Value Date   CHOL 188 09/01/2023   Lab Results  Component Value Date   HDL 50 09/01/2023   Lab Results  Component Value Date   LDLCALC 117 (H) 09/01/2023   Lab Results  Component Value Date   TRIG 118 09/01/2023   Lab Results  Component Value Date   CHOLHDL 3.8 09/01/2023   Lab Results  Component Value Date   HGBA1C 5.0 09/01/2023      Assessment & Plan:   Problem List Items Addressed This Visit       Cardiovascular and Mediastinum   Essential hypertension - Primary   BP Readings from Last 1 Encounters:  03/16/24 118/83   Well-controlled with Lisinopril  5 mg QD Counseled for compliance with the medications Advised DASH diet and moderate exercise/walking, at least 150 mins/week        Musculoskeletal  and Integument   Post-traumatic osteoarthritis of knees, bilateral   Left knee pain, has history of traumatic meniscal tear of knee Followed by orthopedic surgeon - has had knee aspiration with steroid injection Tylenol  arthritis as needed for pain Meloxicam prescribed for as needed use, advised to be cautious not to use it daily due to history of CKD Has been advised for right TKA, but he prefers to wait for now      Relevant Medications   meloxicam (MOBIC) 7.5 MG tablet     Genitourinary   Stage 3a chronic kidney disease (HCC)   Rise in S[place] Cr. since hospitalization for COVID Last BMP stable, GFR 60 Avoid nephrotoxic agents On Lisinopril  for HTN Reviewed UA - no gross proteinuria      Other Visit Diagnoses       Encounter for immunization       Relevant Orders   Flu vaccine trivalent PF, 6mos and older(Flulaval,Afluria,Fluarix,Fluzone) (Completed)        Meds ordered this encounter  Medications   meloxicam (MOBIC) 7.5 MG tablet    Sig: Take 1 tablet (7.5 mg total) by mouth daily.    Dispense:  30 tablet    Refill:  1    Follow-up: Return in about 6 months (around 03/16/2025) for HTN and CKD.    Suzzane MARLA Blanch, MD

## 2024-09-15 NOTE — Assessment & Plan Note (Addendum)
 BP Readings from Last 1 Encounters:  09/15/24 133/84   Well-controlled with Lisinopril  5 mg QD Counseled for compliance with the medications Advised DASH diet and moderate exercise/walking, at least 150 mins/week

## 2024-09-15 NOTE — Patient Instructions (Addendum)
 Please take Meloxicam only for moderate or persistent knee pain. Prefer to take Tynelol arthritis for knee pain.  Please continue to take medications as prescribed.  Please continue to follow low salt diet and perform moderate exercise/walking at least 150 mins/week.

## 2024-09-15 NOTE — Assessment & Plan Note (Signed)
 Left knee pain, has history of traumatic meniscal tear of knee Followed by orthopedic surgeon - has had knee aspiration with steroid injection Tylenol  arthritis as needed for pain Meloxicam prescribed for as needed use, advised to be cautious not to use it daily due to history of CKD Has been advised for right TKA, but he prefers to wait for now

## 2024-12-25 ENCOUNTER — Ambulatory Visit: Payer: Self-pay | Admitting: Orthopedic Surgery

## 2024-12-27 ENCOUNTER — Encounter: Payer: Self-pay | Admitting: Orthopedic Surgery

## 2024-12-27 ENCOUNTER — Ambulatory Visit: Admitting: Orthopedic Surgery

## 2024-12-27 DIAGNOSIS — I872 Venous insufficiency (chronic) (peripheral): Secondary | ICD-10-CM

## 2024-12-27 DIAGNOSIS — M1712 Unilateral primary osteoarthritis, left knee: Secondary | ICD-10-CM

## 2024-12-27 DIAGNOSIS — M1711 Unilateral primary osteoarthritis, right knee: Secondary | ICD-10-CM

## 2024-12-27 DIAGNOSIS — M25461 Effusion, right knee: Secondary | ICD-10-CM

## 2024-12-27 DIAGNOSIS — M25462 Effusion, left knee: Secondary | ICD-10-CM

## 2024-12-27 MED ORDER — METHYLPREDNISOLONE ACETATE 40 MG/ML IJ SUSP
40.0000 mg | Freq: Once | INTRAMUSCULAR | Status: AC
  Administered 2024-12-27: 40 mg via INTRA_ARTICULAR

## 2024-12-27 NOTE — Progress Notes (Signed)
" ° °  Patient: Jackson Figueroa           Date of Birth: 31-Jul-1961           MRN: [id number] Visit Date: 12/27/2024 Requested by: Tobie Suzzane POUR, MD [address] [place],  [place] [address] PCP: Tobie Suzzane POUR, MD  Encounter Diagnoses  Name Primary?   Effusion, right knee Yes   Effusion of knee joint, left    Primary osteoarthritis of right knee    Primary osteoarthritis of left knee     Assessment and plan:  Bilateral knee OA bilateral knee effusion aspiration injection both knees  Return in June to schedule surgery for July probably right knee first as the left is not as symptomatic  Vascular consult to evaluate varicose veins    Procedure :  Aspiration and injection  verbal consent was obtained to aspirate and inject the  right  knee joint   Timeout was completed to confirm the site of aspiration and injection   Medication:  40 mg of Depo-Medrol  and 1% lidocaine  3 cc  Anesthesia was provided by ethyl chloride and the skin was prepped with alcohol.  After cleaning the skin with alcohol an 18-gauge needle was used to aspirate the right knee joint.  Fluid obtained: 120  Color: Thick clear yellow  We followed this by injection of 40 mg of Depo-Medrol  and 3 cc 1% lidocaine .  There were no complications. A sterile bandage was applied.  Procedure note injection and aspiration left knee joint  Verbal consent was obtained to aspirate and inject the left knee joint   Timeout was completed to confirm the site of aspiration and injection  An 18-gauge needle was used to aspirate the left knee joint from a suprapatellar lateral approach.  The medications used were 40 mg of Depo-Medrol  and 1% lidocaine  3 cc  Anesthesia was provided by ethyl chloride and the skin was prepped with alcohol.  After cleaning the skin with alcohol an 18-gauge needle was used to aspirate the right knee joint.  We obtained 200 cc of fluid clear and yellow  We followed this by injection of  40 mg of Depo-Medrol  and 3 cc 1% lidocaine .  There were no complications. A sterile bandage was applied.     Meds ordered this encounter  Medications   methylPREDNISolone  acetate (DEPO-MEDROL ) injection 40 mg   methylPREDNISolone  acetate (DEPO-MEDROL ) injection 40 mg     Chief Complaint  Patient presents with   Knee Pain    Right     History:  Jackson Figueroa 64 years old he has bilateral knee pain with knee arthritis and bilateral severe knee effusions  He is considering surgical intervention at this time  He does have some significant varicose veins which may increase his risk for DVT  He has a trip coming up in the summer cruise and he may need to postpone surgery till February  He does complain of pain and difficulty with activities of daily living including 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    "

## 2024-12-27 NOTE — Addendum Note (Signed)
 Addended byBETHA JENEAN GREIG LELON on: 12/27/2024 12:05 PM   Modules accepted: Orders

## 2024-12-27 NOTE — Progress Notes (Signed)
" ° ° °  12/27/2024   Chief Complaint  Patient presents with   Knee Pain    Right     No diagnosis found.  What pharmacy do you use ? ______[facility]_____________________  DOI/DOS/ Date:    Did you get better, worse or no change (Answer below)   Worse/ pain increased about 2 weeks after the last visit       "

## 2025-03-16 ENCOUNTER — Ambulatory Visit: Payer: Self-pay | Admitting: Internal Medicine

## 2025-04-26 ENCOUNTER — Ambulatory Visit: Admitting: Orthopedic Surgery
